# Patient Record
Sex: Male | Born: 1960
Health system: Southern US, Community
[De-identification: ages and names within clinical notes are randomized; demographics above are authoritative.]

## PROBLEM LIST (undated history)

## (undated) DIAGNOSIS — I2510 Atherosclerotic heart disease of native coronary artery without angina pectoris: Secondary | ICD-10-CM

## (undated) DIAGNOSIS — D509 Iron deficiency anemia, unspecified: Secondary | ICD-10-CM

## (undated) DIAGNOSIS — M549 Dorsalgia, unspecified: Secondary | ICD-10-CM

## (undated) DIAGNOSIS — N529 Male erectile dysfunction, unspecified: Secondary | ICD-10-CM

## (undated) DIAGNOSIS — B1920 Unspecified viral hepatitis C without hepatic coma: Secondary | ICD-10-CM

## (undated) DIAGNOSIS — I739 Peripheral vascular disease, unspecified: Secondary | ICD-10-CM

## (undated) DIAGNOSIS — I10 Essential (primary) hypertension: Secondary | ICD-10-CM

## (undated) DIAGNOSIS — K859 Acute pancreatitis without necrosis or infection, unspecified: Secondary | ICD-10-CM

## (undated) DIAGNOSIS — G709 Myoneural disorder, unspecified: Secondary | ICD-10-CM

## (undated) DIAGNOSIS — G8929 Other chronic pain: Secondary | ICD-10-CM

## (undated) HISTORY — DX: Acute pancreatitis without necrosis or infection, unspecified: K85.90

## (undated) HISTORY — DX: Unspecified viral hepatitis C without hepatic coma: B19.20

## (undated) HISTORY — DX: Essential (primary) hypertension: I10

## (undated) HISTORY — DX: Iron deficiency anemia, unspecified: D50.9

---

## 2012-01-09 ENCOUNTER — Emergency Department (HOSPITAL_COMMUNITY): Payer: Self-pay

## 2012-01-09 ENCOUNTER — Inpatient Hospital Stay (HOSPITAL_COMMUNITY)
Admission: EM | Admit: 2012-01-09 | Discharge: 2012-01-10 | DRG: 440 | Disposition: A | Discharge status: Home / Self Care | Payer: Self-pay | Attending: Infectious Diseases | Admitting: Infectious Diseases

## 2012-01-09 ENCOUNTER — Inpatient Hospital Stay (HOSPITAL_COMMUNITY): Payer: Self-pay

## 2012-01-09 ENCOUNTER — Other Ambulatory Visit: Payer: Self-pay

## 2012-01-09 ENCOUNTER — Encounter (HOSPITAL_COMMUNITY): Payer: Self-pay | Admitting: *Deleted

## 2012-01-09 DIAGNOSIS — K851 Biliary acute pancreatitis without necrosis or infection: Secondary | ICD-10-CM

## 2012-01-09 DIAGNOSIS — K859 Acute pancreatitis without necrosis or infection, unspecified: Principal | ICD-10-CM

## 2012-01-09 DIAGNOSIS — R109 Unspecified abdominal pain: Secondary | ICD-10-CM

## 2012-01-09 DIAGNOSIS — K8020 Calculus of gallbladder without cholecystitis without obstruction: Secondary | ICD-10-CM | POA: Diagnosis present

## 2012-01-09 DIAGNOSIS — F17200 Nicotine dependence, unspecified, uncomplicated: Secondary | ICD-10-CM | POA: Diagnosis present

## 2012-01-09 DIAGNOSIS — K2980 Duodenitis without bleeding: Secondary | ICD-10-CM | POA: Diagnosis present

## 2012-01-09 DIAGNOSIS — I10 Essential (primary) hypertension: Secondary | ICD-10-CM | POA: Diagnosis present

## 2012-01-09 DIAGNOSIS — K2970 Gastritis, unspecified, without bleeding: Secondary | ICD-10-CM | POA: Diagnosis present

## 2012-01-09 DIAGNOSIS — E119 Type 2 diabetes mellitus without complications: Secondary | ICD-10-CM | POA: Diagnosis present

## 2012-01-09 DIAGNOSIS — E1140 Type 2 diabetes mellitus with diabetic neuropathy, unspecified: Secondary | ICD-10-CM

## 2012-01-09 DIAGNOSIS — K2990 Gastroduodenitis, unspecified, without bleeding: Secondary | ICD-10-CM | POA: Diagnosis present

## 2012-01-09 HISTORY — DX: Myoneural disorder, unspecified: G70.9

## 2012-01-09 HISTORY — DX: Acute pancreatitis without necrosis or infection, unspecified: K85.90

## 2012-01-09 LAB — DIFFERENTIAL
Basophils Absolute: 0.1 K/uL (ref 0.0–0.1)
Basophils Relative: 1 % (ref 0–1)
Eosinophils Absolute: 0.1 K/uL (ref 0.0–0.7)
Eosinophils Relative: 1 % (ref 0–5)
Lymphocytes Relative: 49 % — ABNORMAL HIGH (ref 12–46)
Lymphs Abs: 3.6 10*3/uL (ref 0.7–4.0)
Monocytes Absolute: 0.7 10*3/uL (ref 0.1–1.0)
Monocytes Relative: 10 % (ref 3–12)
Neutro Abs: 2.8 10*3/uL (ref 1.7–7.7)
Neutrophils Relative %: 39 % — ABNORMAL LOW (ref 43–77)

## 2012-01-09 LAB — URINALYSIS, ROUTINE W REFLEX MICROSCOPIC
Bilirubin Urine: NEGATIVE
Glucose, UA: NEGATIVE mg/dL
Hgb urine dipstick: NEGATIVE
Ketones, ur: NEGATIVE mg/dL
Leukocytes, UA: NEGATIVE
Nitrite: NEGATIVE
Protein, ur: NEGATIVE mg/dL
Specific Gravity, Urine: 1.022 (ref 1.005–1.030)
Urobilinogen, UA: 1 mg/dL (ref 0.0–1.0)
pH: 6.5 (ref 5.0–8.0)

## 2012-01-09 LAB — CBC
HCT: 44.4 % (ref 39.0–52.0)
HCT: 45 % (ref 39.0–52.0)
Hemoglobin: 15 g/dL (ref 13.0–17.0)
Hemoglobin: 15 g/dL (ref 13.0–17.0)
MCH: 24.4 pg — ABNORMAL LOW (ref 26.0–34.0)
MCH: 24.3 pg — ABNORMAL LOW (ref 26.0–34.0)
MCHC: 33.8 g/dL (ref 30.0–36.0)
MCHC: 33.3 g/dL (ref 30.0–36.0)
MCV: 72.2 fL — ABNORMAL LOW (ref 78.0–100.0)
MCV: 72.8 fL — ABNORMAL LOW (ref 78.0–100.0)
Platelets: 203 10*3/uL (ref 150–400)
Platelets: 219 10*3/uL (ref 150–400)
RBC: 6.15 MIL/uL — ABNORMAL HIGH (ref 4.22–5.81)
RBC: 6.18 MIL/uL — ABNORMAL HIGH (ref 4.22–5.81)
RDW: 13.5 % (ref 11.5–15.5)
RDW: 13.7 % (ref 11.5–15.5)
WBC: 7.3 K/uL (ref 4.0–10.5)
WBC: 7.3 K/uL (ref 4.0–10.5)

## 2012-01-09 LAB — COMPREHENSIVE METABOLIC PANEL
BUN: 11 mg/dL (ref 6–23)
CO2: 27 mEq/L (ref 19–32)
Calcium: 9.5 mg/dL (ref 8.4–10.5)
Creatinine, Ser: 0.72 mg/dL (ref 0.50–1.35)
GFR calc Af Amer: >90 mL/min (ref >90)
GFR calc non Af Amer: >90 mL/min (ref >90)
Glucose, Bld: 148 mg/dL — ABNORMAL HIGH (ref 70–99)
Total Bilirubin: 0.4 mg/dL (ref 0.3–1.2)

## 2012-01-09 LAB — COMPREHENSIVE METABOLIC PANEL WITH GFR
ALT: 23 U/L (ref 0–53)
AST: 23 U/L (ref 0–37)
Albumin: 3.4 g/dL — ABNORMAL LOW (ref 3.5–5.2)
Alkaline Phosphatase: 101 U/L (ref 39–117)
Chloride: 102 meq/L (ref 96–112)
Potassium: 4.4 meq/L (ref 3.5–5.1)
Sodium: 138 meq/L (ref 135–145)
Total Protein: 7.4 g/dL (ref 6.0–8.3)

## 2012-01-09 LAB — CREATININE, SERUM
Creatinine, Ser: 0.69 mg/dL (ref 0.50–1.35)
GFR calc Af Amer: >90 mL/min (ref >90)
GFR calc non Af Amer: >90 mL/min (ref >90)

## 2012-01-09 LAB — LIPID PANEL
Cholesterol: 171 mg/dL (ref 0–200)
HDL: 41 mg/dL (ref >39)
HDL: 40 mg/dL (ref >39)
LDL Cholesterol: 105 mg/dL — ABNORMAL HIGH (ref 0–99)
LDL Cholesterol: 104 mg/dL — ABNORMAL HIGH (ref 0–99)
Total CHOL/HDL Ratio: 4.3 RATIO
Triglycerides: 96 mg/dL (ref <150)
Triglycerides: 134 mg/dL (ref <150)
VLDL: 27 mg/dL (ref 0–40)

## 2012-01-09 LAB — RAPID URINE DRUG SCREEN, HOSP PERFORMED
Amphetamines: NOT DETECTED
Barbiturates: POSITIVE — AB
Benzodiazepines: NOT DETECTED
Cocaine: NOT DETECTED
Opiates: NOT DETECTED
Tetrahydrocannabinol: POSITIVE — AB

## 2012-01-09 LAB — HEMOGLOBIN A1C
Hgb A1c MFr Bld: 8.1 % — ABNORMAL HIGH (ref <5.7)
Mean Plasma Glucose: 186 mg/dL — ABNORMAL HIGH (ref <117)

## 2012-01-09 LAB — GLUCOSE, CAPILLARY: Glucose-Capillary: 112 mg/dL — ABNORMAL HIGH (ref 70–99)

## 2012-01-09 LAB — LIPASE, BLOOD: Lipase: 71 U/L — ABNORMAL HIGH (ref 11–59)

## 2012-01-09 LAB — TSH: TSH: 1.093 u[IU]/mL (ref 0.350–4.500)

## 2012-01-09 LAB — POCT I-STAT TROPONIN I: Troponin i, poc: 0 ng/mL (ref 0.00–0.08)

## 2012-01-09 MED ORDER — HEPARIN SODIUM (PORCINE) 5000 UNIT/ML IJ SOLN
5000.0000 [IU] | Freq: Three times a day (TID) | INTRAMUSCULAR | Status: DC
  Administered 2012-01-09 (×2): 5000 [IU] via SUBCUTANEOUS
  Filled 2012-01-09 (×6): qty 1

## 2012-01-09 MED ORDER — SODIUM CHLORIDE 0.9 % IV SOLN
Freq: Once | INTRAVENOUS | Status: AC
  Administered 2012-01-09: 09:00:00 via INTRAVENOUS

## 2012-01-09 MED ORDER — ONDANSETRON 4 MG PO TBDP
8.0000 mg | ORAL_TABLET | Freq: Once | ORAL | Status: AC
  Administered 2012-01-09: 8 mg via ORAL
  Filled 2012-01-09: qty 2

## 2012-01-09 MED ORDER — IOHEXOL 300 MG/ML  SOLN
100.0000 mL | Freq: Once | INTRAMUSCULAR | Status: AC | PRN
  Administered 2012-01-09: 100 mL via INTRAVENOUS

## 2012-01-09 MED ORDER — GI COCKTAIL ~~LOC~~
30.0000 mL | Freq: Once | ORAL | Status: AC
  Administered 2012-01-09: 30 mL via ORAL
  Filled 2012-01-09: qty 30

## 2012-01-09 MED ORDER — HYDROMORPHONE HCL PF 1 MG/ML IJ SOLN
1.0000 mg | Freq: Once | INTRAMUSCULAR | Status: AC
  Administered 2012-01-09: 1 mg via INTRAVENOUS
  Filled 2012-01-09: qty 1

## 2012-01-09 MED ORDER — BISMUTH SUBSALICYLATE 262 MG/15ML PO SUSP
15.0000 mL | Freq: Four times a day (QID) | ORAL | Status: DC | PRN
  Filled 2012-01-09: qty 236

## 2012-01-09 MED ORDER — INSULIN ASPART 100 UNIT/ML ~~LOC~~ SOLN
0.0000 [IU] | Freq: Three times a day (TID) | SUBCUTANEOUS | Status: DC
  Filled 2012-01-09: qty 3

## 2012-01-09 MED ORDER — SODIUM CHLORIDE 0.9 % IV SOLN
INTRAVENOUS | Status: DC
  Administered 2012-01-09: 16:00:00 via INTRAVENOUS

## 2012-01-09 MED ORDER — PANTOPRAZOLE SODIUM 40 MG PO TBEC
40.0000 mg | DELAYED_RELEASE_TABLET | Freq: Two times a day (BID) | ORAL | Status: DC
  Administered 2012-01-09 – 2012-01-10 (×3): 40 mg via ORAL
  Filled 2012-01-09: qty 1

## 2012-01-09 MED ORDER — MORPHINE SULFATE 2 MG/ML IJ SOLN
2.0000 mg | INTRAMUSCULAR | Status: DC | PRN
  Administered 2012-01-09 (×2): 2 mg via INTRAVENOUS
  Filled 2012-01-09 (×2): qty 1

## 2012-01-09 MED ORDER — IOHEXOL 300 MG/ML  SOLN: 20.0000 mL | INTRAMUSCULAR | Status: AC

## 2012-01-09 MED ORDER — INFLUENZA VIRUS VACC SPLIT PF IM SUSP
0.5000 mL | INTRAMUSCULAR | Status: DC
  Filled 2012-01-09: qty 0.5

## 2012-01-09 MED ORDER — PANTOPRAZOLE SODIUM 40 MG PO TBEC
DELAYED_RELEASE_TABLET | ORAL | Status: AC
  Administered 2012-01-09: 40 mg via ORAL
  Filled 2012-01-09: qty 1

## 2012-01-09 MED ORDER — HYDROMORPHONE HCL PF 1 MG/ML IJ SOLN
0.5000 mg | Freq: Once | INTRAMUSCULAR | Status: AC
  Administered 2012-01-09: 0.5 mg via INTRAVENOUS

## 2012-01-09 MED ORDER — IOHEXOL 300 MG/ML  SOLN
40.0000 mL | Freq: Once | INTRAMUSCULAR | Status: AC | PRN
  Administered 2012-01-09: 40 mL via ORAL

## 2012-01-09 MED ORDER — ACETAMINOPHEN 650 MG RE SUPP: 650.0000 mg | Freq: Four times a day (QID) | RECTAL | Status: DC | PRN

## 2012-01-09 MED ORDER — ACETAMINOPHEN 325 MG PO TABS: 650.0000 mg | ORAL_TABLET | Freq: Four times a day (QID) | ORAL | Status: DC | PRN

## 2012-01-09 MED ORDER — HYDROMORPHONE HCL PF 1 MG/ML IJ SOLN
1.0000 mg | INTRAMUSCULAR | Status: DC | PRN
  Filled 2012-01-09: qty 1

## 2012-01-09 MED ORDER — PNEUMOCOCCAL VAC POLYVALENT 25 MCG/0.5ML IJ INJ
0.5000 mL | INJECTION | INTRAMUSCULAR | Status: DC
  Filled 2012-01-09: qty 0.5

## 2012-01-09 MED ORDER — PANTOPRAZOLE SODIUM 40 MG PO TBEC
40.0000 mg | DELAYED_RELEASE_TABLET | Freq: Every day | ORAL | Status: DC
  Filled 2012-01-09: qty 1

## 2012-01-09 NOTE — ED Notes (Signed)
Pt resting comfortably now that he has been medicated again. Pt advised of plan of care and who has been called in for consults. Pt offered pillow but states that at this time he is comfortable. Pt offered toilet but states that he doesn't need it at this time. Iv site clear for infiltrate. Waiting on admitting md.

## 2012-01-09 NOTE — Consult Note (Signed)
Reason for Consult:Abdominal pain, cholelithiasis, pancreatitis Referring Physician: Ghim NO Primary care Wed 01/09/12   Donald Palmer is an 51 y.o. male.  HPI: Pt presents with abdominal pain.  He says it started after drinking at [employer]. PM 01/04/11, He ate and [employer] then got sick with N/V.  Doesn't remember much after that till he got up Sunday morning still had pain.  Pain has been ongoing since Sunday.  +n/v on Sunday and Monday, none since Tuesday.  He has had problems with this in the past for at least 5 years.  Usually comes on after, "drinking and worrying."  He takes Zantac for this.  He has a hx of AODM, but is not really compliant with his meds.  Medicine bottles are all from [place], and the latest is his Zantac which is dated 11/2011, His Metformin is dated 02/2011, and Actos, 04/2011.  He admits to taking meds irreuglarly because he does not have a job or way to pay for them. He says his pain has become worse, and he also reports a headache with his abd. Pain.  W/U shows normal WBC,Normal LFT's, and Ultrasound with single mobile Gallstone, neg. Murphy's GB wall 3mm. We are ask to see for cholelithiasis, and possible cholecystectomy.  Past Medical History  Diagnosis Date  . Diabetes mellitus   Tobacco use Etoh Use Gerd Abdominal pain on and off 5 years  History reviewed. No pertinent past surgical history.  History reviewed. No pertinent family history.  Social History:  reports that he has quit smoking. He does not have any smokeless tobacco history on file. He reports that he drinks alcohol. His drug history not on file. ETOH :4-5 beers on weekends  Tob: 1/2 PPD since age 89  Drugs: MJ on Friday. Unemployed, family helps him pay his bills.  11th grade education Allergies: No Known Allergies  Medications:  Prior to Admission:  (Not in a hospital admission) Scheduled:   . sodium chloride   Intravenous Once  . gi cocktail  30 mL Oral Once  .  HYDROmorphone (DILAUDID)  injection  0.5 mg Intravenous Once  .  HYDROmorphone (DILAUDID) injection  1 mg Intravenous Once  .  HYDROmorphone (DILAUDID) injection  1 mg Intravenous Once  . ondansetron  8 mg Oral Once   Continuous:  WUJ:[id number] (DILAUDID) injection  Results for orders placed during the hospital encounter of 01/09/12 (from the past 48 hour(s))  CBC     Status: Abnormal   Collection Time   01/09/12  8:50 AM      Component Value Range Comment   WBC 7.3  4.0 - 10.5 (K/uL)    RBC 6.15 (*) 4.22 - 5.81 (MIL/uL)    Hemoglobin 15.0  13.0 - 17.0 (g/dL)    HCT 13.0  86.5 - 78.4 (%)    MCV 72.2 (*) 78.0 - 100.0 (fL)    MCH 24.4 (*) 26.0 - 34.0 (pg)    MCHC 33.8  30.0 - 36.0 (g/dL)    RDW 69.6  29.5 - 28.4 (%)    Platelets 203  150 - 400 (K/uL)   DIFFERENTIAL     Status: Abnormal   Collection Time   01/09/12  8:50 AM      Component Value Range Comment   Neutrophils Relative 39 (*) 43 - 77 (%)    Lymphocytes Relative 49 (*) 12 - 46 (%)    Monocytes Relative 10  3 - 12 (%)    Eosinophils Relative 1  0 -  5 (%)    Basophils Relative 1  0 - 1 (%)    Neutro Abs 2.8  1.7 - 7.7 (K/uL)    Lymphs Abs 3.6  0.7 - 4.0 (K/uL)    Monocytes Absolute 0.7  0.1 - 1.0 (K/uL)    Eosinophils Absolute 0.1  0.0 - 0.7 (K/uL)    Basophils Absolute 0.1  0.0 - 0.1 (K/uL)    Smear Review MORPHOLOGY UNREMARKABLE     COMPREHENSIVE METABOLIC PANEL     Status: Abnormal   Collection Time   01/09/12  8:50 AM      Component Value Range Comment   Sodium 138  135 - 145 (mEq/L)    Potassium 4.4  3.5 - 5.1 (mEq/L)    Chloride 102  96 - 112 (mEq/L)    CO2 27  19 - 32 (mEq/L)    Glucose, Bld 148 (*) 70 - 99 (mg/dL)    BUN 11  6 - 23 (mg/dL)    Creatinine, Ser 1.61  0.50 - 1.35 (mg/dL)    Calcium 9.5  8.4 - 10.5 (mg/dL)    Total Protein 7.4  6.0 - 8.3 (g/dL)    Albumin 3.4 (*) 3.5 - 5.2 (g/dL)    AST 23  0 - 37 (U/L)    ALT 23  0 - 53 (U/L)    Alkaline Phosphatase 101  39 - 117 (U/L)    Total Bilirubin 0.4  0.3 - 1.2 (mg/dL)     GFR calc non Af Amer >90  >90 (mL/min)    GFR calc Af Amer >90  >90 (mL/min)   LIPASE, BLOOD     Status: Abnormal   Collection Time   01/09/12  8:50 AM      Component Value Range Comment   Lipase 71 (*) 11 - 59 (U/L)   POCT I-STAT TROPONIN I     Status: Normal   Collection Time   01/09/12  8:51 AM      Component Value Range Comment   Troponin i, poc 0.00  0.00 - 0.08 (ng/mL)    Comment 3            URINALYSIS, ROUTINE W REFLEX MICROSCOPIC     Status: Normal   Collection Time   01/09/12  9:56 AM      Component Value Range Comment   Color, Urine YELLOW  YELLOW     APPearance CLEAR  CLEAR     Specific Gravity, Urine 1.022  1.005 - 1.030     pH 6.5  5.0 - 8.0     Glucose, UA NEGATIVE  NEGATIVE (mg/dL)    Hgb urine dipstick NEGATIVE  NEGATIVE     Bilirubin Urine NEGATIVE  NEGATIVE     Ketones, ur NEGATIVE  NEGATIVE (mg/dL)    Protein, ur NEGATIVE  NEGATIVE (mg/dL)    Urobilinogen, UA 1.0  0.0 - 1.0 (mg/dL)    Nitrite NEGATIVE  NEGATIVE     Leukocytes, UA NEGATIVE  NEGATIVE  MICROSCOPIC NOT DONE ON URINES WITH NEGATIVE PROTEIN, BLOOD, LEUKOCYTES, NITRITE, OR GLUCOSE <1000 mg/dL.    US Abdomen Complete  01/09/2012  *RADIOLOGY REPORT*  Clinical Data:  Pancreatitis, abdominal pain.  COMPLETE ABDOMINAL ULTRASOUND  Comparison:  None.  Findings:  Gallbladder:  Single small echogenic focus with shadowing within the gallbladder compatible with small stone.  Gallbladder wall is borderline thickened at 3 mm.  Negative sonographic Murphy's.  Common bile duct:   Within normal limits in caliber.  Liver:  No  focal lesion identified.  Within normal limits in parenchymal echogenicity.  IVC:  Appears normal.  Pancreas:  Not well visualized due to overlying bowel gas.  Spleen:  Within normal limits in size and echotexture.  Right Kidney:   Normal in size and parenchymal echogenicity.  No evidence of mass or hydronephrosis.  Left Kidney:  Normal in size and parenchymal echogenicity.  No evidence of mass or  hydronephrosis.  Abdominal aorta:  No aneurysm identified.  IMPRESSION: Single small mobile gallstone.  Borderline gallbladder wall thickness without sonographic Murphy's sign.  Original Report Authenticated By: Cyndie Chime, M.D.    Review of Systems  Constitutional: Negative for fever, chills, weight loss, malaise/fatigue and diaphoresis.  Eyes: Negative.   Respiratory: Negative.   Cardiovascular: Negative.   Gastrointestinal: Positive for heartburn, nausea (none since Tuesday), vomiting (None since Tues) and abdominal pain (Vague, he describes it to me as Left side at end of exam, and mid abd when I first started talking to himl). Negative for diarrhea, constipation and blood in stool.  Genitourinary: Negative.   Musculoskeletal: Negative.   Neurological: Positive for tingling, sensory change and headaches. Negative for dizziness, tremors, speech change, focal weakness, seizures, loss of consciousness and weakness.       Complains of numbness and tinging in lower extremities. Sites vary with time.  Endo/Heme/Allergies: Negative.    Blood pressure 131/93, pulse 68, temperature 98.1 F (36.7 C), temperature source Oral, resp. rate 18, SpO2 98.00%. Physical Exam  Constitutional: He is oriented to person, place, and time. He appears well-developed and well-nourished. No distress.  HENT:  Head: Normocephalic and atraumatic.  Nose: Nose normal.  Eyes: Conjunctivae and EOM are normal. Pupils are equal, round, and reactive to light. Right eye exhibits no discharge. Left eye exhibits no discharge. No scleral icterus.  Neck: Normal range of motion. Neck supple. No JVD present. No tracheal deviation present. No thyromegaly present.  Cardiovascular: Normal rate, regular rhythm, normal heart sounds and intact distal pulses.  Exam reveals no gallop and no friction rub.   No murmur heard. Respiratory: Effort normal and breath sounds normal. No respiratory distress. He has no wheezes. He has no rales.  He exhibits no tenderness.  GI: Soft. Bowel sounds are normal. He exhibits no distension and no mass. There is tenderness (Currently tender LUQ and LLQ, no pain on right side.). There is no rebound and no guarding.  Lymphadenopathy:    He has no cervical adenopathy.  Neurological: He is alert and oriented to person, place, and time. No cranial nerve deficit. He exhibits normal muscle tone.  Skin: Skin is warm and dry.  Psychiatric: He has a normal mood and affect. His behavior is normal. Judgment and thought content normal.    Assessment/Plan: 1. Abdominal pain with mild rise in lipase, mild pancreatitis, Cholelithiasis. 2. Gerd/ possible gastritis 3. Diabetes with poor Medical compliance 4.Ongoing ETOH, Tobacco, and MJ use. 5. Possible diabetic neuropathy. 6. On going headache.  Plan:  I would start PPI, control pain, recheck labs.  I do not think his gallstone is the cause of his pain.  He is currently without any pain on Right.  Thursa Emme 01/09/2012, 2:33 PM

## 2012-01-09 NOTE — ED Notes (Signed)
Urine sample requested from patient.  Patient states he is unable to void at this time.  Urinal left at bedside.

## 2012-01-09 NOTE — ED Notes (Signed)
Report to receiving RN, pt aware of poc, reports relief with pain medication

## 2012-01-09 NOTE — ED Notes (Signed)
[id number] Ready

## 2012-01-09 NOTE — ED Provider Notes (Signed)
History     CSN: [id number]  Arrival date & time 01/09/12  0707   First MD Initiated Contact with Patient 01/09/12 [phone]      Chief Complaint  Patient presents with  . Abdominal Pain    (Consider location/radiation/quality/duration/timing/severity/associated sxs/prior treatment) HPI Comments: Patient reports prior history of abdominal problems. He reports that he was recently in [place] staying with a woman that he is no longer with. He was seen there at a local hospital and was given what sounds to be a GI cocktail which provided relief. He also has a significant history of type 2 diabetes. Patient reports that he did have about 5 years last Saturday, and then began having his abdominal symptoms again. He has continued to have some upper epigastric discomfort that is nonradiating. It associated with her change in appetite. He reports that he has been trying to drink ginger ale and will eat a small amount of them may have some vomiting. He denies any constipation, diarrhea. He denies melanotic stools or rectal bleeding. He denies any back pain, chest pain, shortness of breath. No fevers or chills. Denies a progress report type infectious symptoms.  He denies any prior abd surgeries in the past.  Patient is a 51 y.o. male presenting with abdominal pain. The history is provided by the patient.  Abdominal Pain The primary symptoms of the illness include abdominal pain, nausea and vomiting.    Past Medical History  Diagnosis Date  . Diabetes mellitus   . Neuromuscular disorder     History reviewed. No pertinent past surgical history.  History reviewed. No pertinent family history.  History  Substance Use Topics  . Smoking status: Former Smoker -- 0.2 packs/day    Quit date: 01/05/2012  . Smokeless tobacco: Never Used  . Alcohol Use: 3.0 oz/week    5 Cans of beer per week      Review of Systems  Gastrointestinal: Positive for nausea, vomiting and abdominal pain.  All other  systems reviewed and are negative.    Allergies  Review of patient's allergies indicates no known allergies.  Home Medications   No current outpatient prescriptions on file.  BP 158/95  Pulse 78  Temp(Src) 98 F (36.7 C) (Oral)  Resp 16  Ht 5\' 9"  (1.753 m)  Wt 181 lb (82.101 kg)  BMI 26.73 kg/m2  SpO2 98%  Physical Exam  Nursing note and vitals reviewed. Constitutional: He is oriented to person, place, and time. He appears well-developed and well-nourished.  HENT:  Head: Normocephalic and atraumatic.  Eyes: Pupils are equal, round, and reactive to light.  Neck: Normal range of motion. Neck supple.  Cardiovascular: Normal rate, regular rhythm and normal heart sounds.   Pulmonary/Chest: Effort normal and breath sounds normal. No respiratory distress. He has no wheezes.  Abdominal: Soft. He exhibits no distension. There is no tenderness. There is no rebound and no guarding.  Neurological: He is alert and oriented to person, place, and time.  Skin: Skin is warm and dry. No rash noted.  Psychiatric: He has a normal mood and affect.    ED Course  Procedures (including critical care time)  Labs Reviewed  CBC - Abnormal; Notable for the following:    RBC 6.15 (*)    MCV 72.2 (*)    MCH 24.4 (*)    All other components within normal limits  DIFFERENTIAL - Abnormal; Notable for the following:    Neutrophils Relative 39 (*)    Lymphocytes Relative 49 (*)  All other components within normal limits  COMPREHENSIVE METABOLIC PANEL - Abnormal; Notable for the following:    Glucose, Bld 148 (*)    Albumin 3.4 (*)    All other components within normal limits  LIPASE, BLOOD - Abnormal; Notable for the following:    Lipase 71 (*)    All other components within normal limits  LIPID PANEL - Abnormal; Notable for the following:    LDL Cholesterol 105 (*)    All other components within normal limits  HEMOGLOBIN A1C - Abnormal; Notable for the following:    Hemoglobin A1C 8.1 (*)     Mean Plasma Glucose 186 (*)    All other components within normal limits  CBC - Abnormal; Notable for the following:    RBC 6.18 (*)    MCV 72.8 (*)    MCH 24.3 (*)    All other components within normal limits  LIPID PANEL - Abnormal; Notable for the following:    LDL Cholesterol 104 (*)    All other components within normal limits  URINE RAPID DRUG SCREEN (HOSP PERFORMED) - Abnormal; Notable for the following:    Tetrahydrocannabinol POSITIVE (*)    Barbiturates POSITIVE (*)    All other components within normal limits  CBC - Abnormal; Notable for the following:    RBC 6.36 (*)    MCV 73.1 (*)    MCH 23.6 (*)    All other components within normal limits  COMPREHENSIVE METABOLIC PANEL - Abnormal; Notable for the following:    Glucose, Bld 108 (*)    Albumin 3.4 (*)    All other components within normal limits  GLUCOSE, CAPILLARY - Abnormal; Notable for the following:    Glucose-Capillary 111 (*)    All other components within normal limits  GLUCOSE, CAPILLARY - Abnormal; Notable for the following:    Glucose-Capillary 112 (*)    All other components within normal limits  URINALYSIS, ROUTINE W REFLEX MICROSCOPIC  POCT I-STAT TROPONIN I  CREATININE, SERUM  TSH  HIV ANTIBODY (ROUTINE TESTING)  MICROALBUMIN / CREATININE URINE RATIO  LIPASE, BLOOD  I-STAT TROPONIN I  POCT CBG MONITORING  HEMOGLOBIN A1C  POCT CBG MONITORING   US Abdomen Complete  01/09/2012  *RADIOLOGY REPORT*  Clinical Data:  Pancreatitis, abdominal pain.  COMPLETE ABDOMINAL ULTRASOUND  Comparison:  None.  Findings:  Gallbladder:  Single small echogenic focus with shadowing within the gallbladder compatible with small stone.  Gallbladder wall is borderline thickened at 3 mm.  Negative sonographic Murphy's.  Common bile duct:   Within normal limits in caliber.  Liver:  No focal lesion identified.  Within normal limits in parenchymal echogenicity.  IVC:  Appears normal.  Pancreas:  Not well visualized due to  overlying bowel gas.  Spleen:  Within normal limits in size and echotexture.  Right Kidney:   Normal in size and parenchymal echogenicity.  No evidence of mass or hydronephrosis.  Left Kidney:  Normal in size and parenchymal echogenicity.  No evidence of mass or hydronephrosis.  Abdominal aorta:  No aneurysm identified.  IMPRESSION: Single small mobile gallstone.  Borderline gallbladder wall thickness without sonographic Murphy's sign.  Original Report Authenticated By: Cyndie Chime, M.D.   Ct Abdomen Pelvis W Contrast  01/09/2012  *RADIOLOGY REPORT*  Clinical Data: Epigastric pain.  Pancreatitis.  The nausea and vomiting.  Fever.  CT ABDOMEN AND PELVIS WITH CONTRAST  Technique:  Multidetector CT imaging of the abdomen and pelvis was performed following the standard protocol during  bolus administration of intravenous contrast.  Contrast: 40mL OMNIPAQUE IOHEXOL 300 MG/ML IV SOLN, 1 OMNIPAQUE IOHEXOL 300 MG/ML IV SOLN, OMNIPAQUE IOHEXOL 300 MG/ML IV SOLN  Comparison: Abdominal ultrasound of 01/09/2012.  Findings:  The lung bases are clear without focal nodule, mass, or airspace disease.  The heart size is normal.  No significant pleural or pericardial effusion is present.  The liver and spleen are within normal limits.  There is diffuse wall thickening throughout the stomach, particularly in the antrum extending to the duodenal bulb.  The more distal duodenum is unremarkable.  The pancreas is within normal limits.  The common bile duct and gallbladder are unremarkable.  The adrenal glands are normal bilaterally.  The kidneys and ureters are unremarkable.  The rectosigmoid colon is within normal limits.  The remainder of the colon is unremarkable.  The appendix is visualized and within normal limits.  The small bowel is unremarkable.  No significant adenopathy or free fluid is present.  The bone windows are unremarkable.  IMPRESSION:  1.  Edematous inflammatory changes the distal stomach or proximal duodenum  compatible with gastroduodenitis. 2.  No focal abnormality of the pancreas. 3.  The gallbladder is unremarkable. 4.  Mild atherosclerotic changes.  Original Report Authenticated By: Jamesetta Orleans. MATTERN, M.D.     1. Pancreatitis, gallstone     Room air saturation is 95% which is normal.  ECG done at time 09:02, shows a sinus rhythm at rate 57. There is borderline PR prolongation at 204 ms. Borderline poor R-wave progression from leads V1 through V3. Otherwise axis is normal and there are no ST or T wave changes noted. There no prior EKGs for comparison the  MDM  Patient's vital signs are otherwise stable. He is afebrile. His abdominal exam shows no gross tenderness. Based on his description, he may have an early ulcer or acid reflux. The other possibility given his history would be some mild alcoholic gastritis. Plan is to give him Zofran and a GI cocktail. If he has complete resolution of his symptoms, I feel the patient is stable to be discharged. If he does not, will obtain blood tests and possibly imaging for his abdominal discomfort.      8:37 AM Pt reports gi cocktail may have helped just briefly, but pain has since gotten worse.  Will check labs, give IVF's and IV analgesics.  Pancreatitis is on differential.  9:45 AM LFT's are ok, WBC is not elevated.  Lipase is slightly up at 71.  Likely pt has mild pancreatitis.  Will get U/S to assess for cholecystitis, which I doubt and to r/o pancreatic mass.  Given he had beers on Saturday prior to symptom onset, likely alcohol is cause of pancreatitis.    12:22 PM Patient just informed RN that he still has 10/10 abdominal pain. At this point do to the ultrasound suggestive of gallstones with borderline wall thickening and his persistent pain, will consult Gen. surgery to evaluate the patient. However his WBC count and LFTs are not elevated at this time.  This is reassuring considering pain has been present for 4 days.  May require  admission      I discussed with general surgery who believes pt is ok for medical admit, they will consult.  I spoke to [facility] resident team to evaluate and admit for observation and treatment of symptoms.  Pt is moved to [facility] to await admission.   Gavin Pound. Oletta Lamas, MD 01/10/12 [phone]

## 2012-01-09 NOTE — ED Notes (Signed)
2ND REQUEST FOR URINE.

## 2012-01-09 NOTE — H&P (Signed)
Hospital Admission Note Date: 01/09/2012  Patient name: Donald Palmer Medical record number: [id number] Date of birth: 1961/04/14 Age: 51 y.o. Gender: male PCP: DEFAULT,PROVIDER, MD, MD  Medical Service: Maurice March  Attending physician: Dr. Maurice March    1st Contact:    Dr. Maisie Fus   Pager: [phone] 2nd Contact:  Dr. Allena Katz              Pager: [phone]  After 5 pm or weekends: 1st Contact:      Pager: [phone] 2nd Contact:      Pager: [phone]   Chief Complaint: Abdominal Pain  History of Present Illness: Patient is a 51 y.o. man with past medical history of DM2 and what sounds like GERD, however, patient does not recall precise diagnosis.  He presents to the ED with abdominal pain which started Saturday night.  He had been out a club with friends where he had about 4 beers and then stopped for [employer] fish sandwich.  After eating he was sick and vomited.  Since he has had decreased appetite though he reports that he has been trying to drink ginger ale and will eat a small amount.  He continued to have episodes of vomiting Sunday and Monday, but it seems to have subsided, however, the pain has become unbearable.  He denies any constipation, diarrhea. He denies melanotic stools or rectal bleeding. He denies any back pain, chest pain, shortness of breath. He does describe the pain as being across his upper abdomen and sharply radiating upwards to the top of his head.  Unclear if he has visual changes with the pain, but does describe "numbness" of his face. No fevers or chills. He denies any prior abdominal surgeries in the past.  Meds: Medications Prior to Admission  Medication Dose Route Frequency Provider Last Rate Last Dose  . 0.9 %  sodium chloride infusion   Intravenous Once Gavin Pound. Ghim, MD 150 mL/hr at 01/09/12 0901    . gi cocktail  30 mL Oral Once Gavin Pound. Ghim, MD   30 mL at 01/09/12 0744  . HYDROmorphone (DILAUDID) injection 0.5 mg  0.5 mg Intravenous Once Gavin Pound. Ghim, MD   0.5 mg at  01/09/12 1330  . HYDROmorphone (DILAUDID) injection 1 mg  1 mg Intravenous Once Gavin Pound. Ghim, MD   1 mg at 01/09/12 0901  . HYDROmorphone (DILAUDID) injection 1 mg  1 mg Intravenous Once Gavin Pound. Ghim, MD   1 mg at 01/09/12 1228  . HYDROmorphone (DILAUDID) injection 1 mg  1 mg Intravenous Q3H PRN Gavin Pound. Ghim, MD      . ondansetron (ZOFRAN-ODT) disintegrating tablet 8 mg  8 mg Oral Once Gavin Pound. Ghim, MD   8 mg at 01/09/12 0744   No current outpatient prescriptions on file as of 01/09/2012.    Allergies: Review of patient's allergies indicates no known allergies. Past Medical History  Diagnosis Date  . Diabetes mellitus    History reviewed. No pertinent past surgical history. History reviewed. No pertinent family history. History   Social History  . Marital Status: Single    Spouse Name: N/A    Number of Children: N/A  . Years of Education: N/A   Occupational History  . Not on file.   Social History Main Topics  . Smoking status: Former Games developer  . Smokeless tobacco: Not on file  . Alcohol Use: Yes  . Drug Use:   . Sexually Active:    Other Topics Concern  . Not on file  Social History Narrative  . No narrative on file    Review of Systems: Pertinent positives reported in HPI, all others in 10 pt ROS negative.  Physical Exam: Blood pressure 131/93, pulse 68, temperature 98.1 F (36.7 C), temperature source Oral, resp. rate 18, SpO2 98.00%. Nursing note and vitals reviewed.  Constitutional: He is oriented to person, place, and time. He appears well-developed and well-nourished.  Head: Normocephalic and atraumatic.  Cardiovascular: Normal rate, regular rhythm and normal heart sounds.  Pulmonary/Chest: Effort normal and breath sounds normal. No respiratory distress. He has no wheezes.  Abdominal: Soft. He exhibits no distension. Tenderness of upper abdomen. There is no rebound and no guarding.  Neurological: He is alert and oriented to person, place, and  time.  Skin: Skin is warm and dry. No rash noted.  Psychiatric: He has a normal mood and affect.   Lab results: Basic Metabolic Panel:  [facility] 01/09/12 0850  NA 138  K 4.4  CL 102  CO2 27  GLUCOSE 148*  BUN 11  CREATININE 0.72  CALCIUM 9.5  MG --  PHOS --   Liver Function Tests:  [facility] 01/09/12 0850  AST 23  ALT 23  ALKPHOS 101  BILITOT 0.4  PROT 7.4  ALBUMIN 3.4*    Basename 01/09/12 0850  LIPASE 71*  AMYLASE --   CBC:  Basename 01/09/12 0850  WBC 7.3  NEUTROABS 2.8  HGB 15.0  HCT 44.4  MCV 72.2*  PLT 203    Imaging results:  US Abdomen Complete  01/09/2012  *RADIOLOGY REPORT*  Clinical Data:  Pancreatitis, abdominal pain.  COMPLETE ABDOMINAL ULTRASOUND  Comparison:  None.  Findings:  Gallbladder:  Single small echogenic focus with shadowing within the gallbladder compatible with small stone.  Gallbladder wall is borderline thickened at 3 mm.  Negative sonographic Murphy's.  Common bile duct:   Within normal limits in caliber.  Liver:  No focal lesion identified.  Within normal limits in parenchymal echogenicity.  IVC:  Appears normal.  Pancreas:  Not well visualized due to overlying bowel gas.  Spleen:  Within normal limits in size and echotexture.  Right Kidney:   Normal in size and parenchymal echogenicity.  No evidence of mass or hydronephrosis.  Left Kidney:  Normal in size and parenchymal echogenicity.  No evidence of mass or hydronephrosis.  Abdominal aorta:  No aneurysm identified.  IMPRESSION: Single small mobile gallstone.  Borderline gallbladder wall thickness without sonographic Murphy's sign.  Original Report Authenticated By: Cyndie Chime, M.D.    Other results: EKG: normal sinus rhythm, normal axis, PR prolongation suggestive of first degree block.  No ST changes suggestive ACS, some repolarization abnormality in anterior leads. No prior EKGs for comparison.  No PMH of heart disease.  Assessment & Plan by Problem:  Pancreatitis Acute  Pancreatitis: Pt has had abdominal pain, nausea and vomiting ongoing for 4 days.  US shows single gallstone with negative murphy's sign.  Labs show slightly elevated lipase which is consistent with diagnosis of pancreatitis, also concern for cholelithiasis, cholecystitis, gastris, peptic ulcer disease though less likely the cause of his current complaint.  The patient is currently hemodynamically stable.   Plan -We will admit the patient to regular floor  -NPO until surgery evaluates  -IV fluids for hydration.  -zofran prn nausea -morphine prn for pain  -recheck CBC, CMP, lipase, and lipid panel in the AM -surgery consulted for question need for cholecystectomy  Diabetes mellitus, type 2- unknown  -hold home medications  -SSI  for glucose control -A1c in AM -consult diabetes educator -offer flu vaccine and pneumovax -carb modified diet once no longer NPO -will attempt to get records from hospital and PCP in [place], [place].  Dispo: home when tolerating oral intake and when pain in under control. Recommend follow up with PCP to establish care back in [place].  Signed: Lamont Tant 01/09/2012, 1:50 PM

## 2012-01-09 NOTE — Consult Note (Signed)
Patient seen and examined.  His clinical and laboratory findings are suspicious for alcohol related pancreatitis.  This is a recurring pattern after binge drinking.  Will order a CT scan to assess the pancreas.

## 2012-01-09 NOTE — ED Notes (Signed)
Pt reports 4 day hx of abdominal pain, reports hx of abdominal pain but can not give me any specifics.

## 2012-01-10 LAB — COMPREHENSIVE METABOLIC PANEL
AST: 22 U/L (ref 0–37)
Albumin: 3.4 g/dL — ABNORMAL LOW (ref 3.5–5.2)
Alkaline Phosphatase: 98 U/L (ref 39–117)
BUN: 8 mg/dL (ref 6–23)
CO2: 27 mEq/L (ref 19–32)
Chloride: 101 mEq/L (ref 96–112)
Potassium: 4 mEq/L (ref 3.5–5.1)
Total Bilirubin: 0.8 mg/dL (ref 0.3–1.2)

## 2012-01-10 LAB — CBC
HCT: 46.5 % (ref 39.0–52.0)
Hemoglobin: 15 g/dL (ref 13.0–17.0)
MCH: 23.6 pg — ABNORMAL LOW (ref 26.0–34.0)
MCHC: 32.3 g/dL (ref 30.0–36.0)
MCV: 73.1 fL — ABNORMAL LOW (ref 78.0–100.0)
Platelets: 218 10*3/uL (ref 150–400)
RBC: 6.36 MIL/uL — ABNORMAL HIGH (ref 4.22–5.81)
RDW: 13.8 % (ref 11.5–15.5)
WBC: 6.6 K/uL (ref 4.0–10.5)

## 2012-01-10 LAB — MICROALBUMIN / CREATININE URINE RATIO
Creatinine, Urine: 116.8 mg/dL
Microalb Creat Ratio: 8.2 mg/g (ref 0.0–30.0)
Microalb, Ur: 0.96 mg/dL (ref 0.00–1.89)

## 2012-01-10 LAB — LIPASE, BLOOD: Lipase: 48 U/L (ref 11–59)

## 2012-01-10 LAB — HIV ANTIBODY (ROUTINE TESTING W REFLEX): HIV: NONREACTIVE

## 2012-01-10 MED ORDER — OMEPRAZOLE 20 MG PO CPDR: 20.0000 mg | DELAYED_RELEASE_CAPSULE | Freq: Every day | ORAL | Status: DC

## 2012-01-10 MED ORDER — GI COCKTAIL ~~LOC~~: 30.0000 mL | Freq: Two times a day (BID) | ORAL | Status: DC | PRN

## 2012-01-10 NOTE — Progress Notes (Signed)
Subjective: Patient feels completely well this morning and is very anxious to leave.  Discussed switching from an H2 blocker to PPI and gave patient prescription for omeprazole.  Patient also requested we prescribe a GI cocktail and that has been very helpful in the past.  Patient counseled to abstain from alcohol and to avoid GI irritating medications such as aspirin and NSAIDs.  Was also advised to follow up with our clinic in 2 weeks and appointment was made for him.   Objective: Vital signs in last 24 hours: Filed Vitals:   01/09/12 1508 01/09/12 1630 01/09/12 2025 01/10/12 0600  BP: 143/80 154/86 140/84 158/95  Pulse: 60 54 60 78  Temp:  97.8 F (36.6 C) 98.2 F (36.8 C) 98 F (36.7 C)  TempSrc:  Oral    Resp: 18 19 18 16   Height:  5\' 9"  (1.753 m)    Weight:  82.101 kg (181 lb)    SpO2: 97% 100% 100% 98%   Weight change:   Intake/Output Summary (Last 24 hours) at 01/10/12 1455 Last data filed at 01/10/12 0600  Gross per 24 hour  Intake 1748.75 ml  Output   1550 ml  Net 198.75 ml   Physical Exam: BP 158/95  Pulse 78  Temp(Src) 98 F (36.7 C) (Oral)  Resp 16  Ht 5\' 9"  (1.753 m)  Wt 82.101 kg (181 lb)  BMI 26.73 kg/m2  SpO2 98% General appearance: alert, cooperative and patient left before full exam could be completed Lungs: clear to auscultation bilaterally Heart: regular rate and rhythm, S1, S2 normal, no murmur, click, rub or gallop Abdomen: soft, non-tender Lab Results: Admission on 01/09/2012, Discharged on 01/10/2012  Component Date Value Range Status  . WBC (K/uL) 01/09/2012 7.3  4.0-10.5 Final  . RBC (MIL/uL) 01/09/2012 6.15* 4.22-5.81 Final  . Hemoglobin (g/dL) 40/98/1191 47.8  29.5-62.1 Final  . HCT (%) 01/09/2012 44.4  39.0-52.0 Final  . MCV (fL) 01/09/2012 72.2* 78.0-100.0 Final  . MCH (pg) 01/09/2012 24.4* 26.0-34.0 Final  . MCHC (g/dL) 30/86/5784 69.6  29.5-28.4 Final  . RDW (%) 01/09/2012 13.5  11.5-15.5 Final  . Platelets (K/uL) 01/09/2012 203   150-400 Final  . Neutrophils Relative (%) 01/09/2012 39* 43-77 Final  . Lymphocytes Relative (%) 01/09/2012 49* 12-46 Final  . Monocytes Relative (%) 01/09/2012 10  3-12 Final  . Eosinophils Relative (%) 01/09/2012 1  0-5 Final  . Basophils Relative (%) 01/09/2012 1  0-1 Final  . Neutro Abs (K/uL) 01/09/2012 2.8  1.7-7.7 Final  . Lymphs Abs (K/uL) 01/09/2012 3.6  0.7-4.0 Final  . Monocytes Absolute (K/uL) 01/09/2012 0.7  0.1-1.0 Final  . Eosinophils Absolute (K/uL) 01/09/2012 0.1  0.0-0.7 Final  . Basophils Absolute (K/uL) 01/09/2012 0.1  0.0-0.1 Final  . Smear Review  01/09/2012 MORPHOLOGY UNREMARKABLE   Final  . Sodium (mEq/L) 01/09/2012 138  135-145 Final  . Potassium (mEq/L) 01/09/2012 4.4  3.5-5.1 Final  . Chloride (mEq/L) 01/09/2012 102  96-112 Final  . CO2 (mEq/L) 01/09/2012 27  19-32 Final  . Glucose, Bld (mg/dL) 13/24/4010 272* 53-66 Final  . BUN (mg/dL) 44/02/4741 11  5-95 Final  . Creatinine, Ser (mg/dL) 63/87/5643 3.29  5.18-8.41 Final  . Calcium (mg/dL) 66/05/3015 9.5  0.1-09.3 Final  . Total Protein (g/dL) 23/55/7322 7.4  0.2-5.4 Final  . Albumin (g/dL) 27/05/2375 3.4* 2.8-3.1 Final  . AST (U/L) 01/09/2012 23  0-37 Final  . ALT (U/L) 01/09/2012 23  0-53 Final  . Alkaline Phosphatase (U/L) 01/09/2012 101  39-117 Final  .  Total Bilirubin (mg/dL) 16/09/9603 0.4  5.4-0.9 Final  . GFR calc non Af Amer (mL/min) 01/09/2012 >90  >90 Final  . GFR calc Af Amer (mL/min) 01/09/2012 >90  >90 Final  . Lipase (U/L) 01/09/2012 71* 11-59 Final  . Color, Urine  01/09/2012 YELLOW  YELLOW Final  . APPearance  01/09/2012 CLEAR  CLEAR Final  . Specific Gravity, Urine  01/09/2012 1.022  1.005-1.030 Final  . pH  01/09/2012 6.5  5.0-8.0 Final  . Glucose, UA (mg/dL) 81/19/1478 NEGATIVE  NEGATIVE Final  . Hgb urine dipstick  01/09/2012 NEGATIVE  NEGATIVE Final  . Bilirubin Urine  01/09/2012 NEGATIVE  NEGATIVE Final  . Ketones, ur (mg/dL) 29/56/2130 NEGATIVE  NEGATIVE Final  . Protein, ur  (mg/dL) 86/57/8469 NEGATIVE  NEGATIVE Final  . Urobilinogen, UA (mg/dL) 62/95/2841 1.0  3.2-4.4 Final  . Nitrite  01/09/2012 NEGATIVE  NEGATIVE Final  . Leukocytes, UA  01/09/2012 NEGATIVE  NEGATIVE Final  . Troponin i, poc (ng/mL) 01/09/2012 0.00  0.00-0.08 Final  . Cholesterol (mg/dL) 12/19/7251 664  4-034 Final  . Triglycerides (mg/dL) 74/25/9563 96  <875 Final  . HDL (mg/dL) 64/33/2951 41  >88 Final  . Total CHOL/HDL Ratio (RATIO) 01/09/2012 4.0   Final  . VLDL (mg/dL) 41/66/0630 19  1-60 Final  . LDL Cholesterol (mg/dL) 10/93/2355 732* 2-02 Final  . Hemoglobin A1C (%) 01/09/2012 8.1* <5.7 Final  . Mean Plasma Glucose (mg/dL) 54/27/0623 762* <831 Final  . WBC (K/uL) 01/09/2012 7.3  4.0-10.5 Final  . RBC (MIL/uL) 01/09/2012 6.18* 4.22-5.81 Final  . Hemoglobin (g/dL) 51/76/1607 37.1  06.2-69.4 Final  . HCT (%) 01/09/2012 45.0  39.0-52.0 Final  . MCV (fL) 01/09/2012 72.8* 78.0-100.0 Final  . MCH (pg) 01/09/2012 24.3* 26.0-34.0 Final  . MCHC (g/dL) 85/46/2703 50.0  93.8-18.2 Final  . RDW (%) 01/09/2012 13.7  11.5-15.5 Final  . Platelets (K/uL) 01/09/2012 219  150-400 Final  . Creatinine, Ser (mg/dL) 99/37/1696 7.89  3.81-0.17 Final  . GFR calc non Af Amer (mL/min) 01/09/2012 >90  >90 Final  . GFR calc Af Amer (mL/min) 01/09/2012 >90  >90 Final  . TSH (uIU/mL) 01/09/2012 1.093  0.350-4.500 Final  . Cholesterol (mg/dL) 51/01/5851 778  2-423 Final  . Triglycerides (mg/dL) 53/61/4431 540  <086 Final  . HDL (mg/dL) 76/19/5093 40  >26 Final  . Total CHOL/HDL Ratio (RATIO) 01/09/2012 4.3   Final  . VLDL (mg/dL) 71/24/5809 27  9-83 Final  . LDL Cholesterol (mg/dL) 38/25/0539 767* 3-41 Final  . HIV  01/09/2012 NON REACTIVE  NON REACTIVE Final  . Opiates  01/09/2012 NONE DETECTED  NONE DETECTED Final  . Cocaine  01/09/2012 NONE DETECTED  NONE DETECTED Final  . Benzodiazepines  01/09/2012 NONE DETECTED  NONE DETECTED Final  . Amphetamines  01/09/2012 NONE DETECTED  NONE DETECTED Final  .  Tetrahydrocannabinol  01/09/2012 POSITIVE* NONE DETECTED Final  . Barbiturates  01/09/2012 POSITIVE* NONE DETECTED Final  . Microalb, Ur (mg/dL) 93/79/0240 9.73  5.32-9.92 Final  . Creatinine, Urine (mg/dL) 42/68/3419 622.2   Final  . Microalb Creat Ratio (mg/g) 01/09/2012 8.2  0.0-30.0 Final  . WBC (K/uL) 01/10/2012 6.6  4.0-10.5 Final  . RBC (MIL/uL) 01/10/2012 6.36* 4.22-5.81 Final  . Hemoglobin (g/dL) 97/98/9211 94.1  74.0-81.4 Final  . HCT (%) 01/10/2012 46.5  39.0-52.0 Final  . MCV (fL) 01/10/2012 73.1* 78.0-100.0 Final  . MCH (pg) 01/10/2012 23.6* 26.0-34.0 Final  . MCHC (g/dL) 48/18/5631 49.7  02.6-37.8 Final  . RDW (%) 01/10/2012 13.8  11.5-15.5 Final  .  Platelets (K/uL) 01/10/2012 218  150-400 Final  . Sodium (mEq/L) 01/10/2012 137  135-145 Final  . Potassium (mEq/L) 01/10/2012 4.0  3.5-5.1 Final  . Chloride (mEq/L) 01/10/2012 101  96-112 Final  . CO2 (mEq/L) 01/10/2012 27  19-32 Final  . Glucose, Bld (mg/dL) 16/09/9603 540* 98-11 Final  . BUN (mg/dL) 91/47/8295 8  6-21 Final  . Creatinine, Ser (mg/dL) 30/86/5784 6.96  2.95-2.84 Final  . Calcium (mg/dL) 13/24/4010 9.6  2.7-25.3 Final  . Total Protein (g/dL) 66/44/0347 7.1  4.2-5.9 Final  . Albumin (g/dL) 56/38/7564 3.4* 3.3-2.9 Final  . AST (U/L) 01/10/2012 22  0-37 Final  . ALT (U/L) 01/10/2012 21  0-53 Final  . Alkaline Phosphatase (U/L) 01/10/2012 98  39-117 Final  . Total Bilirubin (mg/dL) 51/88/4166 0.8  0.6-3.0 Final  . GFR calc non Af Amer (mL/min) 01/10/2012 >90  >90 Final  . GFR calc Af Amer (mL/min) 01/10/2012 >90  >90 Final  . Lipase (U/L) 01/10/2012 48  11-59 Final  . Hemoglobin A1C (%) 01/10/2012 7.6* <5.7 Final  . Mean Plasma Glucose (mg/dL) 16/12/930 355* <732 Final  . Glucose-Capillary (mg/dL) 20/25/4270 623* 76-28 Final  . Comment 1  01/09/2012 Notify RN   Final  . Glucose-Capillary (mg/dL) 31/51/7616 073* 71-06 Final  . Glucose-Capillary (mg/dL) 26/94/8546 270* 35-00 Final    Micro Results: No  results found for this or any previous visit (from the past 240 hour(s)). Studies/Results: US Abdomen Complete  01/09/2012  *RADIOLOGY REPORT*  Clinical Data:  Pancreatitis, abdominal pain.  COMPLETE ABDOMINAL ULTRASOUND  Comparison:  None.  Findings:  Gallbladder:  Single small echogenic focus with shadowing within the gallbladder compatible with small stone.  Gallbladder wall is borderline thickened at 3 mm.  Negative sonographic Murphy's.  Common bile duct:   Within normal limits in caliber.  Liver:  No focal lesion identified.  Within normal limits in parenchymal echogenicity.  IVC:  Appears normal.  Pancreas:  Not well visualized due to overlying bowel gas.  Spleen:  Within normal limits in size and echotexture.  Right Kidney:   Normal in size and parenchymal echogenicity.  No evidence of mass or hydronephrosis.  Left Kidney:  Normal in size and parenchymal echogenicity.  No evidence of mass or hydronephrosis.  Abdominal aorta:  No aneurysm identified.  IMPRESSION: Single small mobile gallstone.  Borderline gallbladder wall thickness without sonographic Murphy's sign.  Original Report Authenticated By: Cyndie Chime, M.D.   Ct Abdomen Pelvis W Contrast  01/09/2012  *RADIOLOGY REPORT*  Clinical Data: Epigastric pain.  Pancreatitis.  The nausea and vomiting.  Fever.  CT ABDOMEN AND PELVIS WITH CONTRAST  Technique:  Multidetector CT imaging of the abdomen and pelvis was performed following the standard protocol during bolus administration of intravenous contrast.  Contrast: 40mL OMNIPAQUE IOHEXOL 300 MG/ML IV SOLN, 1 OMNIPAQUE IOHEXOL 300 MG/ML IV SOLN, OMNIPAQUE IOHEXOL 300 MG/ML IV SOLN  Comparison: Abdominal ultrasound of 01/09/2012.  Findings:  The lung bases are clear without focal nodule, mass, or airspace disease.  The heart size is normal.  No significant pleural or pericardial effusion is present.  The liver and spleen are within normal limits.  There is diffuse wall thickening throughout the  stomach, particularly in the antrum extending to the duodenal bulb.  The more distal duodenum is unremarkable.  The pancreas is within normal limits.  The common bile duct and gallbladder are unremarkable.  The adrenal glands are normal bilaterally.  The kidneys and ureters are unremarkable.  The rectosigmoid colon  is within normal limits.  The remainder of the colon is unremarkable.  The appendix is visualized and within normal limits.  The small bowel is unremarkable.  No significant adenopathy or free fluid is present.  The bone windows are unremarkable.  IMPRESSION:  1.  Edematous inflammatory changes the distal stomach or proximal duodenum compatible with gastroduodenitis. 2.  No focal abnormality of the pancreas. 3.  The gallbladder is unremarkable. 4.  Mild atherosclerotic changes.  Original Report Authenticated By: Jamesetta Orleans. MATTERN, M.D.   Medications: I have reviewed the patient's current medications. Scheduled Meds:   . iohexol  20 mL Intravenous Q1 Hr x 2  . DISCONTD: heparin  5,000 Units Subcutaneous Q8H  . DISCONTD: influenza  inactive virus vaccine  0.5 mL Intramuscular Tomorrow-1000  . DISCONTD: insulin aspart  0-9 Units Subcutaneous TID WC  . DISCONTD: pantoprazole  40 mg Oral Q1200  . DISCONTD: pantoprazole  40 mg Oral BID AC  . DISCONTD: pneumococcal 23 valent vaccine  0.5 mL Intramuscular Tomorrow-1000   Continuous Infusions:   . DISCONTD: sodium chloride 75 mL/hr at 01/09/12 1617   PRN Meds:.iohexol, iohexol, DISCONTD: acetaminophen, DISCONTD: acetaminophen, DISCONTD: bismuth subsalicylate, DISCONTD:  HYDROmorphone (DILAUDID) injection, DISCONTD: morphine Assessment/Plan:  Pancreatitis: Pt has had abdominal pain, nausea and vomiting ongoing for 4 days. US shows single gallstone with negative murphy's sign. Labs show slightly elevated lipase on admission which is consistent with diagnosis of pancreatitis, also concern for cholelithiasis, cholecystitis, gastris, peptic  ulcer disease though less likely the cause of his current complaint. The patient is currently hemodynamically stable.  -symptoms resolved this AM per patient and patient anxious to leave, left prior to full exam and official discharge Plan  -IV fluids for hydration.  -zofran prn nausea  -CBC, lipase, lipids within normal limits -CT shows gastroduodenitis-PPI started -US showed single mobile gallstone -surgery consult-no cholecystectomy indicated at this time -d/c home ranitidine and discharge with PPI   Diabetes mellitus, type 2- unknown  -hold home medications  -SSI for glucose control  -A1c in AM  -consult diabetes educator  -offer flu vaccine and pneumovax  -carb modified diet once no longer NPO  -will attempt to get records from hospital and PCP in [place], [place].   Dispo: home when tolerating oral intake and when pain in under control. Recommend follow up with internal medicine clinic in 2 weeks.   LOS: 1 day   Dwaine Gale 01/10/2012, 2:55 PM

## 2012-01-10 NOTE — Discharge Summary (Signed)
Internal Medicine [facility] Discharge Note  Name: Donald Palmer MRN: [id number] DOB: 03/24/1961 51 y.o.  Date of Admission: 01/09/2012  7:13 AM Date of Discharge: 01/10/2012 Attending Physician: Dr. Maurice March  Discharge Diagnosis: gastroduodenitis- patient was found to have gastroduodenitis per CT scan, patient left hospital without further work up. Prescribed PPI instead of ranitidine. Diabetes Mellitus: under reasonable control with oral agents Hgb A1c of 8.1%. Scheduled to follow up with our primary care clinic since he did not have a provider in the area after returning from [place].  Discharge Medications: Medication List  As of 01/10/2012  1:41 PM   STOP taking these medications         ranitidine 150 MG tablet         TAKE these medications         bismuth subsalicylate 262 MG/15ML suspension   Commonly known as: PEPTO BISMOL   Take 15 mLs by mouth every 6 (six) hours as needed. For stomach      gi cocktail Susp   Take 30 mLs by mouth 2 (two) times daily as needed.      metFORMIN 500 MG tablet   Commonly known as: GLUCOPHAGE   Take 500 mg by mouth 2 (two) times daily with a meal.      pioglitazone 15 MG tablet   Commonly known as: ACTOS   Take 15 mg by mouth daily.            Disposition and follow-up:   [name] was discharged from [facility] in improved condition.    Follow-up Appointments: Follow-up Information    Follow up with SAWHNEY,MEGHA, MD on 01/30/2012.   Contact information:   [address] [place] [address] [phone]         Discharge Orders    Future Appointments: Provider: Department: Dept Phone: Center:   01/30/2012 8:45 AM Elyse Jarvis, MD [facility] [phone] [facility]     Future Orders Please Complete By Expires   Diet - low sodium heart healthy      Increase activity slowly      Discharge instructions      Comments:   Please do NOT take aspirin, ibuprofen, motrin or  aleeve at any time. Reduce alcohol and caffeine use as much as possible.   Call MD for:  persistant nausea and vomiting      Call MD for:  severe uncontrolled pain        Consults: general surgery  Procedures Performed:  US Abdomen Complete  01/09/2012  *RADIOLOGY REPORT*  Clinical Data:  Pancreatitis, abdominal pain.  COMPLETE ABDOMINAL ULTRASOUND  Comparison:  None.  Findings:  Gallbladder:  Single small echogenic focus with shadowing within the gallbladder compatible with small stone.  Gallbladder wall is borderline thickened at 3 mm.  Negative sonographic Murphy's.  Common bile duct:   Within normal limits in caliber.  Liver:  No focal lesion identified.  Within normal limits in parenchymal echogenicity.  IVC:  Appears normal.  Pancreas:  Not well visualized due to overlying bowel gas.  Spleen:  Within normal limits in size and echotexture.  Right Kidney:   Normal in size and parenchymal echogenicity.  No evidence of mass or hydronephrosis.  Left Kidney:  Normal in size and parenchymal echogenicity.  No evidence of mass or hydronephrosis.  Abdominal aorta:  No aneurysm identified.  IMPRESSION: Single small mobile gallstone.  Borderline gallbladder wall thickness without sonographic Murphy's sign.  Original Report  Authenticated By: Cyndie Chime, M.D.   Ct Abdomen Pelvis W Contrast  01/09/2012  *RADIOLOGY REPORT*  Clinical Data: Epigastric pain.  Pancreatitis.  The nausea and vomiting.  Fever.  CT ABDOMEN AND PELVIS WITH CONTRAST  Technique:  Multidetector CT imaging of the abdomen and pelvis was performed following the standard protocol during bolus administration of intravenous contrast.  Contrast: 40mL OMNIPAQUE IOHEXOL 300 MG/ML IV SOLN, 1 OMNIPAQUE IOHEXOL 300 MG/ML IV SOLN, OMNIPAQUE IOHEXOL 300 MG/ML IV SOLN  Comparison: Abdominal ultrasound of 01/09/2012.  Findings:  The lung bases are clear without focal nodule, mass, or airspace disease.  The heart size is normal.  No significant  pleural or pericardial effusion is present.  The liver and spleen are within normal limits.  There is diffuse wall thickening throughout the stomach, particularly in the antrum extending to the duodenal bulb.  The more distal duodenum is unremarkable.  The pancreas is within normal limits.  The common bile duct and gallbladder are unremarkable.  The adrenal glands are normal bilaterally.  The kidneys and ureters are unremarkable.  The rectosigmoid colon is within normal limits.  The remainder of the colon is unremarkable.  The appendix is visualized and within normal limits.  The small bowel is unremarkable.  No significant adenopathy or free fluid is present.  The bone windows are unremarkable.  IMPRESSION:  1.  Edematous inflammatory changes the distal stomach or proximal duodenum compatible with gastroduodenitis. 2.  No focal abnormality of the pancreas. 3.  The gallbladder is unremarkable. 4.  Mild atherosclerotic changes.  Original Report Authenticated By: Jamesetta Orleans. MATTERN, M.D.    Admission HPI:  Patient is a 51 y.o. man with past medical history of DM2 and what sounds like GERD, however, patient does not recall precise diagnosis. He presents to the ED with abdominal pain which started Saturday night. He had been out a club with friends where he had about 4 beers and then stopped for mcd[employer] fish sandwich. After eating he was sick and vomited. Since he has had decreased appetite though he reports that he has been trying to drink ginger ale and will eat a small amount. He continued to have episodes of vomiting Sunday and Monday, but it seems to have subsided, however, the pain has become unbearable. He denies any constipation, diarrhea. He denies melanotic stools or rectal bleeding. He denies any back pain, chest pain, shortness of breath. He does describe the pain as being across his upper abdomen and sharply radiating upwards to the top of his head. Unclear if he has visual changes with the pain,  but does describe "numbness" of his face. No fevers or chills. He denies any prior abdominal surgeries in the past.   Hospital Course by problem list:  *Pancreatitis: Patient presented with abdominal pain, nausea, and vomiting beginning 4 days prior to admission with onset within a few hours of drinking 4-5 beers.  Patient denied hematemesis, SOB, chest pain, syncope, diarrhea, constipation, hematochezia, or fever.  Patient was given treated with pain medication and IVF initially.  Laboratory investigation yielded a mildly elevated lipase, slightly low albumin, but otherwise CBC, BMP, and vitals were all within normal limits.  US showed a single small gallstone and mild wall thickening.  CT was performed and showed gastroduodenitis, but no signs of pancreatic inflammation.  Patient was switched from his home ranitidine to protonix and given a GI cocktail.  Patient was prescribed omeprazole 20mg  daily and GI cocktail 30ml twice daily as needed for  abdominal pain to take at home as well.  These were discussed with the patient before he left and the complaint of abdominal pain was reported by the patient to have resolved.  Lipase had returned to normal range by next morning. Etiology of abdominal pain is believed to be primarily gastroduodenitis despite risk factors of gallstones and alcohol use for pancreatitis.  Surgery was consulted on admission and felt cholecystectomy was not indicated at this time.  Recommended no alcohol, NSAID or aspirin use, however, patient left before being formally discharged and receiving his discharge instructions.  May consider H. Pylori testing as an outpatient if symptoms do not resolve with PPI. Breath testing and antibody test not available as an inpatient, only biopsy testing available.   Diabetes mellitus: patient's sugars were in reasonable control, with a random glucose of 148 on admission and Hgb A1C of 8.1%.  Patient's home meds of actos and metformin were held and during  admission patient's sugars were controlled with sliding scale insulin. No episodes of hyperglycemia or hypoglycemia during hospitalization.  Discharge Vitals:  BP 158/95  Pulse 78  Temp(Src) 98 F (36.7 C) (Oral)  Resp 16  Ht 5\' 9"  (1.753 m)  Wt 82.101 kg (181 lb)  BMI 26.73 kg/m2  SpO2 98%  Discharge Labs:  Results for orders placed during the hospital encounter of 01/09/12 (from the past 24 hour(s))  LIPID PANEL     Status: Abnormal   Collection Time   01/09/12  4:29 PM      Component Value Range   Cholesterol 171  0 - 200 (mg/dL)   Triglycerides 045  <409 (mg/dL)   HDL 40  >81 (mg/dL)   Total CHOL/HDL Ratio 4.3     VLDL 27  0 - 40 (mg/dL)   LDL Cholesterol 191 (*) 0 - 99 (mg/dL)  HEMOGLOBIN Y7W     Status: Abnormal   Collection Time   01/09/12  4:30 PM      Component Value Range   Hemoglobin A1C 8.1 (*) <5.7 (%)   Mean Plasma Glucose 186 (*) <117 (mg/dL)  CBC     Status: Abnormal   Collection Time   01/09/12  4:30 PM      Component Value Range   WBC 7.3  4.0 - 10.5 (K/uL)   RBC 6.18 (*) 4.22 - 5.81 (MIL/uL)   Hemoglobin 15.0  13.0 - 17.0 (g/dL)   HCT 29.5  62.1 - 30.8 (%)   MCV 72.8 (*) 78.0 - 100.0 (fL)   MCH 24.3 (*) 26.0 - 34.0 (pg)   MCHC 33.3  30.0 - 36.0 (g/dL)   RDW 65.7  84.6 - 96.2 (%)   Platelets 219  150 - 400 (K/uL)  CREATININE, SERUM     Status: Normal   Collection Time   01/09/12  4:30 PM      Component Value Range   Creatinine, Ser 0.69  0.50 - 1.35 (mg/dL)   GFR calc non Af Amer >90  >90 (mL/min)   GFR calc Af Amer >90  >90 (mL/min)  TSH     Status: Normal   Collection Time   01/09/12  4:30 PM      Component Value Range   TSH 1.093  0.350 - 4.500 (uIU/mL)  HIV ANTIBODY (ROUTINE TESTING)     Status: Normal   Collection Time   01/09/12  4:30 PM      Component Value Range   HIV NON REACTIVE  NON REACTIVE   URINE RAPID DRUG  SCREEN (HOSP PERFORMED)     Status: Abnormal   Collection Time   01/09/12  4:32 PM      Component Value Range   Opiates  NONE DETECTED  NONE DETECTED    Cocaine NONE DETECTED  NONE DETECTED    Benzodiazepines NONE DETECTED  NONE DETECTED    Amphetamines NONE DETECTED  NONE DETECTED    Tetrahydrocannabinol POSITIVE (*) NONE DETECTED    Barbiturates POSITIVE (*) NONE DETECTED   MICROALBUMIN / CREATININE URINE RATIO     Status: Normal   Collection Time   01/09/12  4:32 PM      Component Value Range   Microalb, Ur 0.96  0.00 - 1.89 (mg/dL)   Creatinine, Urine 960.4     Microalb Creat Ratio 8.2  0.0 - 30.0 (mg/g)  GLUCOSE, CAPILLARY     Status: Abnormal   Collection Time   01/09/12  5:30 PM      Component Value Range   Glucose-Capillary 111 (*) 70 - 99 (mg/dL)   Comment 1 Notify RN    GLUCOSE, CAPILLARY     Status: Abnormal   Collection Time   01/09/12  9:34 PM      Component Value Range   Glucose-Capillary 112 (*) 70 - 99 (mg/dL)  CBC     Status: Abnormal   Collection Time   01/10/12  5:05 AM      Component Value Range   WBC 6.6  4.0 - 10.5 (K/uL)   RBC 6.36 (*) 4.22 - 5.81 (MIL/uL)   Hemoglobin 15.0  13.0 - 17.0 (g/dL)   HCT 54.0  98.1 - 19.1 (%)   MCV 73.1 (*) 78.0 - 100.0 (fL)   MCH 23.6 (*) 26.0 - 34.0 (pg)   MCHC 32.3  30.0 - 36.0 (g/dL)   RDW 47.8  29.5 - 62.1 (%)   Platelets 218  150 - 400 (K/uL)  COMPREHENSIVE METABOLIC PANEL     Status: Abnormal   Collection Time   01/10/12  5:05 AM      Component Value Range   Sodium 137  135 - 145 (mEq/L)   Potassium 4.0  3.5 - 5.1 (mEq/L)   Chloride 101  96 - 112 (mEq/L)   CO2 27  19 - 32 (mEq/L)   Glucose, Bld 108 (*) 70 - 99 (mg/dL)   BUN 8  6 - 23 (mg/dL)   Creatinine, Ser 3.08  0.50 - 1.35 (mg/dL)   Calcium 9.6  8.4 - 65.7 (mg/dL)   Total Protein 7.1  6.0 - 8.3 (g/dL)   Albumin 3.4 (*) 3.5 - 5.2 (g/dL)   AST 22  0 - 37 (U/L)   ALT 21  0 - 53 (U/L)   Alkaline Phosphatase 98  39 - 117 (U/L)   Total Bilirubin 0.8  0.3 - 1.2 (mg/dL)   GFR calc non Af Amer >90  >90 (mL/min)   GFR calc Af Amer >90  >90 (mL/min)  LIPASE, BLOOD     Status: Normal    Collection Time   01/10/12  5:05 AM      Component Value Range   Lipase 48  11 - 59 (U/L)  HEMOGLOBIN A1C     Status: Abnormal   Collection Time   01/10/12  5:05 AM      Component Value Range   Hemoglobin A1C 7.6 (*) <5.7 (%)   Mean Plasma Glucose 171 (*) <117 (mg/dL)

## 2012-01-10 NOTE — Progress Notes (Signed)
CT c/w gastritis and duodenitis.  He has asx cholelithiasis in my opinion and does not need a cholecystectomy at this time.

## 2012-01-10 NOTE — Progress Notes (Signed)
[name], male   DOB: 1961-01-26, 51 y.o.   MRN: [id number]   LOS: 1 day   Subjective: No c/o. Feels better and wants to go home. Denies pain, N/V.  Objective: Vital signs in last 24 hours: Temp:  [97.8 F (36.6 C)-98.2 F (36.8 C)] 98 F (36.7 C) (01/24 0600) Pulse Rate:  [54-78] 78  (01/24 0600) Resp:  [14-19] 16  (01/24 0600) BP: (131-158)/(80-99) 158/95 mmHg (01/24 0600) SpO2:  [96 %-100 %] 98 % (01/24 0600) Weight:  [82.101 kg (181 lb)] 82.101 kg (181 lb) (01/23 1630) Last BM Date: 01/07/12  Lab Results:  CBC  Basename 01/10/12 0505 01/09/12 1630  WBC 6.6 7.3  HGB 15.0 15.0  HCT 46.5 45.0  PLT 218 219   BMET  Basename 01/10/12 0505 01/09/12 1630 01/09/12 0850  NA 137 -- 138  K 4.0 -- 4.4  CL 101 -- 102  CO2 27 -- 27  GLUCOSE 108* -- 148*  BUN 8 -- 11  CREATININE 0.78 0.69 --  CALCIUM 9.6 -- 9.5    General appearance: alert and no distress Resp: clear to auscultation bilaterally Cardio: regular rate and rhythm GI: normal findings: bowel sounds normal and soft, minimal diffuse TTP (described as "tingle")  Assessment/Plan: Abdominal pain -- Resolved. No surgical indication at this time. Surgery will sign off.   Freeman Caldron, PA-C Pager: [phone]   01/10/2012

## 2012-01-23 ENCOUNTER — Other Ambulatory Visit: Payer: Self-pay | Admitting: *Deleted

## 2012-01-30 ENCOUNTER — Encounter: Payer: Self-pay | Admitting: Internal Medicine

## 2012-01-30 ENCOUNTER — Ambulatory Visit (INDEPENDENT_AMBULATORY_CARE_PROVIDER_SITE_OTHER): Payer: Self-pay | Admitting: Internal Medicine

## 2012-01-30 DIAGNOSIS — R51 Headache: Secondary | ICD-10-CM

## 2012-01-30 DIAGNOSIS — Z8679 Personal history of other diseases of the circulatory system: Secondary | ICD-10-CM | POA: Insufficient documentation

## 2012-01-30 DIAGNOSIS — R109 Unspecified abdominal pain: Secondary | ICD-10-CM

## 2012-01-30 DIAGNOSIS — E119 Type 2 diabetes mellitus without complications: Secondary | ICD-10-CM

## 2012-01-30 DIAGNOSIS — E1149 Type 2 diabetes mellitus with other diabetic neurological complication: Secondary | ICD-10-CM

## 2012-01-30 DIAGNOSIS — K219 Gastro-esophageal reflux disease without esophagitis: Secondary | ICD-10-CM

## 2012-01-30 DIAGNOSIS — Z139 Encounter for screening, unspecified: Secondary | ICD-10-CM | POA: Insufficient documentation

## 2012-01-30 DIAGNOSIS — E1142 Type 2 diabetes mellitus with diabetic polyneuropathy: Secondary | ICD-10-CM

## 2012-01-30 DIAGNOSIS — I10 Essential (primary) hypertension: Secondary | ICD-10-CM

## 2012-01-30 DIAGNOSIS — R519 Headache, unspecified: Secondary | ICD-10-CM | POA: Insufficient documentation

## 2012-01-30 DIAGNOSIS — E1140 Type 2 diabetes mellitus with diabetic neuropathy, unspecified: Secondary | ICD-10-CM | POA: Insufficient documentation

## 2012-01-30 LAB — GLUCOSE, CAPILLARY: Glucose-Capillary: 158 mg/dL — ABNORMAL HIGH (ref 70–99)

## 2012-01-30 MED ORDER — OMEPRAZOLE 20 MG PO CPDR: 20.0000 mg | DELAYED_RELEASE_CAPSULE | Freq: Every day | ORAL | Status: DC

## 2012-01-30 MED ORDER — METFORMIN HCL 1000 MG PO TABS: 500.0000 mg | ORAL_TABLET | Freq: Two times a day (BID) | ORAL | Status: DC

## 2012-01-30 MED ORDER — LISINOPRIL 5 MG PO TABS: 5.0000 mg | ORAL_TABLET | Freq: Every day | ORAL | Status: DC

## 2012-01-30 MED ORDER — METFORMIN HCL 1000 MG PO TABS: ORAL_TABLET | ORAL | Status: DC

## 2012-01-30 MED ORDER — GABAPENTIN 300 MG PO CAPS: ORAL_CAPSULE | ORAL | Status: DC

## 2012-01-30 NOTE — Assessment & Plan Note (Signed)
He reports chronic daily headaches for last year or so usually in the morning and gets better when he takes Tylenol or NSAIDs( before the hospitalization ). Differentials include medication overuse headaches versus migraine versus cluster versus tension headaches. I suspect these are most likely medication overuse headaches given the chronicity and the medication use. In the absence of the classic history for migraines( no association with noise, light , aura, frequency), in the absense of any lacrimation, rhinorrhea migraines or cluster headaches are less likely. - Continue to monitor for now. - Was advised not to take Tylenol on daily basis.

## 2012-01-30 NOTE — Assessment & Plan Note (Signed)
He complains of bilateral lower extremity pain associated with tingling and numbness.Neuro exam was completely benign. I think this is most likely related to diabetic neuropathy. -Start him on Neurontin. He was given the instructions on how to gradually increase the dose of Neurontin. He was educated on the side effects and was advised to call the clinic if he experiences any side effects from the new medication.

## 2012-01-30 NOTE — Patient Instructions (Signed)
Please schedule a follow up appointment in 1-2 months . Please bring your medication bottles with your next appointment. Please take your medicines as prescribed. I will call you with your lab results if anything will be abnormal. 

## 2012-01-30 NOTE — Assessment & Plan Note (Addendum)
Hospital followup for abdominal pain thought to be likely related to gastroduodenitis from NSAIDs, alcohol use. He denies any abdominal pain at today's visit though reports occasional nausea but denies any vomiting, blood in stools. - We'll not initiate any further workup like H. pylori testing. -Continue omeprazole for now.  Of note when the patient went to the lab to drop his urine specimen,he mentioned that he would like to be screened for hepatitis because he was told about back in 2000 when he donated plasma that he has Hep B. He was educated that his liver function test were normal when checked back in January of 2012 but would screen him for hepatitis as per his preference.

## 2012-01-30 NOTE — Progress Notes (Signed)
  Subjective:    [name], male    DOB: [birth year], 51 y.o.   MRN: [id number]  HPI: 51 year old man with past medical history significant for diabetes comes to the clinic for hospital followup.  1)Abdominal pain: Patient was recently hospitalized from 01/09/2012 to 01/10/2012 for abdominal pain possibly related to  gastroduodenitis. Patient states that he was taking  lot of Motrin and other NSAIDs for his leg pain and also drank a few drinks prior to the hospital admission but has quit drinking now. He denies any abdominal pain today but reports occasional nausea. Also denies any vomiting, blood in the stools, dizziness.  2) Diabetes: He reports that he has been having pain in both of his legs which he correlates with the onset of his diabetes. He describes it as aching discomfort associated with some tingling and numbness he states that sometimes he couldn't feel anything in his legs. He states that his pain is more worse in the mornings when he gets up  and usually gets better during the day. He also reports that he does not have enough money to buy his medication and  has been sharing his medications with one of his family member but did not disclose the relationship.  3) Headaches: He also reports some headaches in the morning, located in the frontal region, at a frequency of almost every day. He takes Tylenol  that makes it better.   He requests to be screened for hepatitis because apparently he was told back in 1999- 2000 that he was hepatitis B positive and he was donating plasma.    Review of Systems  Constitutional: Negative for chills and fatigue.  HENT: Negative for ear pain, nosebleeds and postnasal drip.   Respiratory: Negative for cough, choking, chest tightness, shortness of breath, wheezing and stridor.   Gastrointestinal: Negative for abdominal pain, diarrhea, constipation and blood in stool.  Musculoskeletal: Positive for arthralgias.  Neurological: Positive for  numbness and headaches. Negative for dizziness and light-headedness.       Objective:   Physical Exam  Constitutional: He is oriented to person, place, and time. He appears well-developed and well-nourished. No distress.  HENT:  Head: Normocephalic and atraumatic.  Mouth/Throat: No oropharyngeal exudate.  Eyes: Conjunctivae and EOM are normal. Pupils are equal, round, and reactive to light. No scleral icterus.  Neck: Normal range of motion. Neck supple. No JVD present. No tracheal deviation present. No thyromegaly present.  Cardiovascular: Normal rate and regular rhythm.  Exam reveals no gallop and no friction rub.   No murmur heard. Pulmonary/Chest: Effort normal and breath sounds normal. No stridor. No respiratory distress. He has no wheezes. He has no rales. He exhibits no tenderness.  Abdominal: Soft. Bowel sounds are normal. He exhibits no distension. There is no tenderness. There is no rebound and no guarding.  Musculoskeletal: Normal range of motion. He exhibits no edema and no tenderness.  Lymphadenopathy:    He has no cervical adenopathy.  Neurological: He is alert and oriented to person, place, and time. He has normal reflexes. He displays normal reflexes. No cranial nerve deficit. Coordination normal.  Skin: Skin is warm. He is not diaphoretic.          Assessment & Plan:

## 2012-01-30 NOTE — Assessment & Plan Note (Addendum)
Lab Results  Component Value Date   HGBA1C 7.6* 01/10/2012   CREATININE 0.78 01/10/2012   MICROALBUR 0.96 01/09/2012   MICRALBCREAT 8.2 01/09/2012   CHOL 171 01/09/2012   HDL 40 01/09/2012   TRIG 134 01/09/2012    Last eye exam and foot exam: Foot exam done   Assessment: Diabetes control: controlled Progress toward goals: not at goal Barriers to meeting goals: no barriers identified  Plan: Diabetes treatment: -Increase metformin from 500 twice a day to 1000 twice a day.                                 - Discontinue Actos as cost is an issue.                                 - check microalbumin/creatinine ratio.                                 - start him on low dose lisinopril  Refer to: diabetes educator for self-management training, diabetes educator for medical nutrition therapy and nutritionist Instruction/counseling given: reminded to bring blood glucose meter & log to each visit, reminded to bring medications to each visit, discussed foot care, discussed the need for weight loss and discussed diet

## 2012-01-30 NOTE — Assessment & Plan Note (Signed)
Start him on low dose lisinopril.

## 2012-01-31 LAB — MICROALBUMIN / CREATININE URINE RATIO: Microalb, Ur: 0.86 mg/dL (ref 0.00–1.89)

## 2012-04-14 ENCOUNTER — Encounter: Payer: Self-pay | Admitting: Internal Medicine

## 2012-04-25 ENCOUNTER — Emergency Department (HOSPITAL_COMMUNITY)
Admission: EM | Admit: 2012-04-25 | Discharge: 2012-04-25 | Disposition: A | Discharge status: Home / Self Care | Payer: Self-pay | Attending: Emergency Medicine | Admitting: Emergency Medicine

## 2012-04-25 ENCOUNTER — Encounter (HOSPITAL_COMMUNITY): Payer: Self-pay

## 2012-04-25 DIAGNOSIS — Z79899 Other long term (current) drug therapy: Secondary | ICD-10-CM | POA: Insufficient documentation

## 2012-04-25 DIAGNOSIS — M79609 Pain in unspecified limb: Secondary | ICD-10-CM | POA: Insufficient documentation

## 2012-04-25 DIAGNOSIS — G8929 Other chronic pain: Secondary | ICD-10-CM | POA: Insufficient documentation

## 2012-04-25 DIAGNOSIS — M545 Low back pain, unspecified: Secondary | ICD-10-CM | POA: Insufficient documentation

## 2012-04-25 DIAGNOSIS — M5416 Radiculopathy, lumbar region: Secondary | ICD-10-CM

## 2012-04-25 DIAGNOSIS — IMO0002 Reserved for concepts with insufficient information to code with codable children: Secondary | ICD-10-CM | POA: Insufficient documentation

## 2012-04-25 DIAGNOSIS — E119 Type 2 diabetes mellitus without complications: Secondary | ICD-10-CM | POA: Insufficient documentation

## 2012-04-25 MED ORDER — HYDROCODONE-ACETAMINOPHEN 5-325 MG PO TABS: 2.0000 | ORAL_TABLET | ORAL | Status: AC | PRN

## 2012-04-25 MED ORDER — HYDROCODONE-ACETAMINOPHEN 5-325 MG PO TABS
2.0000 | ORAL_TABLET | Freq: Once | ORAL | Status: AC
  Administered 2012-04-25: 2 via ORAL
  Filled 2012-04-25: qty 2

## 2012-04-25 NOTE — ED Provider Notes (Signed)
History     CSN: [id number]  Arrival date & time 04/25/12  4098   First MD Initiated Contact with Patient 04/25/12 1012      Chief Complaint  Patient presents with  . Back Pain  . Leg Pain    (Consider location/radiation/quality/duration/timing/severity/associated sxs/prior treatment) HPI Complains of low back pain radiating to both calves onset several months ago becoming worse over the past 4 days. Pain is worse with changing positions improved with remaining still. Pain is moderate sharp in quality. No treatment prior to coming here. No incontinence no fever no injury no other associated symptoms Past Medical History  Diagnosis Date  . Diabetes mellitus   . Neuromuscular disorder    chronic back pain Duodenitis History reviewed. No pertinent past surgical history.  No family history on file.  History  Substance Use Topics  . Smoking status: Current Some Day Smoker -- 0.1 packs/day    Types: Cigarettes    Last Attempt to Quit: 01/05/2012  . Smokeless tobacco: Never Used  . Alcohol Use: 3.0 oz/week    5 Cans of beer per week   Denies drug use   Review of Systems  Constitutional: Negative.   HENT: Negative.   Respiratory: Negative.   Cardiovascular: Negative.   Gastrointestinal: Negative.   Musculoskeletal: Positive for back pain.  Skin: Negative.   Neurological: Negative.   Hematological: Negative.   Psychiatric/Behavioral: Negative.   All other systems reviewed and are negative.    Allergies  Review of patient's allergies indicates no known allergies.  Home Medications   Current Outpatient Rx  Name Route Sig Dispense Refill  . LISINOPRIL 5 MG PO TABS Oral Take 1 tablet (5 mg total) by mouth daily. 30 tablet 3  . METFORMIN HCL 1000 MG PO TABS  Take 1 tablet by mouth twice daily. 60 tablet 3  . OMEPRAZOLE 20 MG PO CPDR Oral Take 1 capsule (20 mg total) by mouth daily. 60 capsule 2    BP 131/77  Pulse 86  Temp(Src) 98.1 F (36.7 C) (Oral)  Resp 18   SpO2 98%  Physical Exam  Constitutional: He appears well-developed and well-nourished. He appears distressed.       Mildly uncomfortable  HENT:  Head: Normocephalic and atraumatic.  Eyes: Conjunctivae are normal. Pupils are equal, round, and reactive to light.  Neck: Neck supple. No tracheal deviation present. No thyromegaly present.  Cardiovascular: Normal rate and regular rhythm.   No murmur heard. Pulmonary/Chest: Effort normal and breath sounds normal.  Abdominal: Soft. Bowel sounds are normal. He exhibits no distension. There is no tenderness.  Musculoskeletal: Normal range of motion. He exhibits no edema and no tenderness.       Mildly tender over lumbar area  Neurological: He is alert. He has normal reflexes. Coordination normal.       Gait normal, DTRs symmetric bilaterally at knee jerk ankle jerk and biceps toes downward going bilaterally  Skin: Skin is warm and dry. No rash noted.  Psychiatric: He has a normal mood and affect.    ED Course  Procedures (including critical care time)  Labs Reviewed - No data to display No results found.   No diagnosis found.    MDM  Plan prescription Norco Referral [facility] Followup [facility] if not improved 2 or 3 days. Diagnosis lumbar radiculopathy         Doug Sou, MD 04/25/12 1023

## 2012-04-25 NOTE — Discharge Instructions (Signed)
Lumbosacral Radiculopathy  Take Tylenol for mild pain or the pain medicine prescribed for bad pain. Go to the [facility] if having significant pain in 2 or 3 days. Call [facility] today to get a primary care doctor Lumbosacral radiculopathy is a pinched nerve or nerves in the low back (lumbosacral area). When this happens you may have weakness in your legs and may not be able to stand on your toes. You may have pain going down into your legs. There may be difficulties with walking normally. There are many causes of this problem. Sometimes this may happen from an injury, or simply from arthritis or boney problems. It may also be caused by other illnesses such as diabetes. If there is no improvement after treatment, further studies may be done to find the exact cause. DIAGNOSIS  X-rays may be needed if the problems become long standing. Electromyograms may be done. This study is one in which the working of nerves and muscles is studied. HOME CARE INSTRUCTIONS   Applications of ice packs may be helpful. Ice can be used in a plastic bag with a towel around it to prevent frostbite to skin. This may be used every 2 hours for 20 to 30 minutes, or as needed, while awake, or as directed by your caregiver.   Only take over-the-counter or prescription medicines for pain, discomfort, or fever as directed by your caregiver.   If physical therapy was prescribed, follow your caregiver's directions.  SEEK IMMEDIATE MEDICAL CARE IF:   You have pain not controlled with medications.   You seem to be getting worse rather than better.   You develop increasing weakness in your legs.   You develop loss of bowel or bladder control.   You have difficulty with walking or balance, or develop clumsiness in the use of your legs.   You have a fever.  MAKE SURE YOU:   Understand these instructions.   Will watch your condition.   Will get help right away if you are not  doing well or get worse.  Document Released: 12/03/2005 Document Revised: 11/22/2011 Document Reviewed: 07/23/2008 [facility] Patient Information 2012 [place], [place].

## 2012-04-25 NOTE — ED Notes (Signed)
Patient presents with right leg pain and bilateral lower back pain x several days.  Patient has been seen in past for same.  Patient denies urinary symptoms and is requesting pain medication.

## 2012-04-25 NOTE — ED Notes (Signed)
Patient ambulatory in department with mild limp.  Patient reports he has an appointment scheduled to see his PCP on Monday.  Patient requesting pain medication for his right leg and back.  Does not want to provide a urine sample because patient states "i know it's not my kidneys"

## 2012-04-28 ENCOUNTER — Encounter: Payer: Self-pay | Admitting: Internal Medicine

## 2012-04-28 ENCOUNTER — Ambulatory Visit (INDEPENDENT_AMBULATORY_CARE_PROVIDER_SITE_OTHER): Payer: Self-pay | Admitting: Internal Medicine

## 2012-04-28 DIAGNOSIS — B1920 Unspecified viral hepatitis C without hepatic coma: Secondary | ICD-10-CM

## 2012-04-28 DIAGNOSIS — Z79899 Other long term (current) drug therapy: Secondary | ICD-10-CM

## 2012-04-28 DIAGNOSIS — I10 Essential (primary) hypertension: Secondary | ICD-10-CM

## 2012-04-28 DIAGNOSIS — Z139 Encounter for screening, unspecified: Secondary | ICD-10-CM

## 2012-04-28 DIAGNOSIS — E1140 Type 2 diabetes mellitus with diabetic neuropathy, unspecified: Secondary | ICD-10-CM

## 2012-04-28 DIAGNOSIS — E1149 Type 2 diabetes mellitus with other diabetic neurological complication: Secondary | ICD-10-CM

## 2012-04-28 DIAGNOSIS — E119 Type 2 diabetes mellitus without complications: Secondary | ICD-10-CM

## 2012-04-28 DIAGNOSIS — M549 Dorsalgia, unspecified: Secondary | ICD-10-CM

## 2012-04-28 DIAGNOSIS — E1142 Type 2 diabetes mellitus with diabetic polyneuropathy: Secondary | ICD-10-CM

## 2012-04-28 LAB — LIPID PANEL
LDL Cholesterol: 77 mg/dL (ref 0–99)
VLDL: 67 mg/dL — ABNORMAL HIGH (ref 0–40)

## 2012-04-28 LAB — GLUCOSE, CAPILLARY: Glucose-Capillary: 215 mg/dL — ABNORMAL HIGH (ref 70–99)

## 2012-04-28 LAB — POCT GLYCOSYLATED HEMOGLOBIN (HGB A1C): Hemoglobin A1C: 7.1

## 2012-04-28 NOTE — Patient Instructions (Addendum)
Please schedule a follow up appointment in 1 month . Please bring your medication bottles with your next appointment. Please take your medicines as prescribed. I will call you with your lab results if anything will be abnormal. 

## 2012-04-28 NOTE — Progress Notes (Signed)
  Subjective:    [name], male    DOB: 1961-07-13, 51 y.o.   MRN: [id number]  HPI: 51 year old man with past medical history significant for diabetes, hypertension comes to the clinic for followup visit.  Patient was an hour late for his clinic appointment and he was explained that it would be a short acute visit today.  The patient was seen in the ER on 04/25/2012 about 3 days prior to this clinic for back pain and was advised to schedule followup with [place]. He describes his back pain as dull aching discomfort, located in the lower back.His pain radiates from his lower legs up to the back. Pain is mostly located on the posterior aspect of his legs.Rates his  pain 7/10 today. He was given a prescription of Vicodin at the ER discharge and states that the medicine helps but the effect lasts just  for 4 hours so he doesn't like the medication. He also reports some tingling and numbness associated with it.Denies any inciting event like trauma or twisting his back or lifting any heavy weights.  Patient was started on Neurontin for diabetic neuropathy with his last clinic visit but he did not refill his meds until about 3 days ago.    Review of Systems  HENT: Negative for nosebleeds, congestion, rhinorrhea and postnasal drip.   Eyes: Negative for visual disturbance.  Respiratory: Negative for shortness of breath, wheezing and stridor.   Cardiovascular: Negative for chest pain, palpitations and leg swelling.  Genitourinary: Negative for frequency and flank pain.  Musculoskeletal: Positive for back pain.  Neurological: Positive for headaches. Negative for seizures and speech difficulty.  Hematological: Negative for adenopathy.  Psychiatric/Behavioral: Negative for agitation.       Objective:   Physical Exam  Constitutional: He is oriented to person, place, and time. He appears well-developed and well-nourished. No distress.  HENT:  Head: Normocephalic and atraumatic.  Mouth/Throat:  No oropharyngeal exudate.  Eyes: Conjunctivae and EOM are normal. Pupils are equal, round, and reactive to light.  Neck: Normal range of motion. Neck supple. No JVD present. No tracheal deviation present. No thyromegaly present.  Cardiovascular: Normal rate, regular rhythm, normal heart sounds and intact distal pulses.  Exam reveals no gallop and no friction rub.   No murmur heard. Pulmonary/Chest: Effort normal and breath sounds normal. No stridor. No respiratory distress. He has no wheezes. He has no rales.  Abdominal: Soft. Bowel sounds are normal. He exhibits no distension. There is no tenderness. There is no rebound.  Musculoskeletal:       SLRT negative.  Lymphadenopathy:    He has no cervical adenopathy.  Neurological: He is alert and oriented to person, place, and time. He has normal reflexes. He displays normal reflexes. No cranial nerve deficit. Coordination normal.  Skin: Skin is warm. He is not diaphoretic.          Assessment & Plan:

## 2012-04-30 DIAGNOSIS — E1142 Type 2 diabetes mellitus with diabetic polyneuropathy: Secondary | ICD-10-CM | POA: Insufficient documentation

## 2012-04-30 NOTE — Assessment & Plan Note (Signed)
Continues to complain of pain in his legs. He just started taking Neurontin about 3 days ago. -Patient was advised to continue taking the medication and was educated that it might take a few weeks for medication to start taking its effect.

## 2012-04-30 NOTE — Assessment & Plan Note (Signed)
His back pain is most likely normal muscular strain. -Patient was advised not to lift any heavy weights and avoid twisting the straining of his back. -He was advised to take anti-inflammatory medications like ibuprofen. -Use hot/cold compresses that would also help with the symptoms

## 2012-04-30 NOTE — Assessment & Plan Note (Signed)
Lab Results  Component Value Date   NA 137 01/10/2012   K 4.0 01/10/2012   CL 101 01/10/2012   CO2 27 01/10/2012   BUN 8 01/10/2012   CREATININE 0.78 01/10/2012    BP Readings from Last 3 Encounters:  04/28/12 119/78  04/25/12 131/77  01/30/12 128/87    Assessment: Hypertension control:  controlled  Progress toward goals:  at goal Barriers to meeting goals:  no barriers identified  Plan: Hypertension treatment:  continue current medications

## 2012-04-30 NOTE — Assessment & Plan Note (Signed)
Lab Results  Component Value Date   HGBA1C 7.1 04/28/2012   HGBA1C 7.6* 01/10/2012   CREATININE 0.78 01/10/2012   MICROALBUR 0.86 01/30/2012   MICRALBCREAT 4.5 01/30/2012   CHOL 184 04/28/2012   HDL 40 04/28/2012   TRIG 335* 04/28/2012    Last eye exam and foot exam: No results found for this basename: HMDIABEYEEXA, HMDIABFOOTEX    Assessment: Diabetes control: controlled Progress toward goals: at goal Barriers to meeting goals: no barriers identified  Plan: Diabetes treatment: continue current medications Refer to: none Instruction/counseling given: reminded to bring blood glucose meter & log to each visit, reminded to bring medications to each visit, discussed foot care, discussed the need for weight loss and discussed diet

## 2012-04-30 NOTE — Assessment & Plan Note (Signed)
Patient requested to be screened for hepatitis with the last visit and he was found to be hepatitis C reactive. -Would check hepatitis C RNA by PCR to quantify his viral load with today's visit.

## 2012-05-01 LAB — HEPATITIS C RNA QUANTITATIVE
HCV Quantitative Log: 6.22 {Log} — ABNORMAL HIGH (ref <1.63)
HCV Quantitative: 1651833 IU/mL — ABNORMAL HIGH (ref <43)

## 2012-05-08 ENCOUNTER — Telehealth: Payer: Self-pay | Admitting: *Deleted

## 2012-05-08 DIAGNOSIS — G629 Polyneuropathy, unspecified: Secondary | ICD-10-CM

## 2012-05-08 NOTE — Telephone Encounter (Signed)
Pt stopped by office stating neurontin is not helping him.  He is taking one in AM and one in PM.  I can't find it listed in him med list??? He states vicodin 5/325 work much better. He is having low back pain with radiation to feet.   He has scheduled appointment 6/17.  Pt # [phone]

## 2012-05-13 ENCOUNTER — Ambulatory Visit (INDEPENDENT_AMBULATORY_CARE_PROVIDER_SITE_OTHER): Payer: Self-pay | Admitting: Internal Medicine

## 2012-05-13 ENCOUNTER — Ambulatory Visit (HOSPITAL_COMMUNITY)
Admission: RE | Admit: 2012-05-13 | Discharge: 2012-05-13 | Disposition: A | Discharge status: Home / Self Care | Payer: Self-pay | Source: Ambulatory Visit | Attending: Internal Medicine | Admitting: Internal Medicine

## 2012-05-13 ENCOUNTER — Encounter: Payer: Self-pay | Admitting: Internal Medicine

## 2012-05-13 VITALS — BP 104/71 | HR 78 | Temp 97.8°F | Ht 69.0 in | Wt 177.0 lb

## 2012-05-13 DIAGNOSIS — B1920 Unspecified viral hepatitis C without hepatic coma: Secondary | ICD-10-CM

## 2012-05-13 DIAGNOSIS — IMO0002 Reserved for concepts with insufficient information to code with codable children: Secondary | ICD-10-CM

## 2012-05-13 DIAGNOSIS — I700 Atherosclerosis of aorta: Secondary | ICD-10-CM | POA: Insufficient documentation

## 2012-05-13 DIAGNOSIS — M545 Low back pain, unspecified: Secondary | ICD-10-CM | POA: Insufficient documentation

## 2012-05-13 DIAGNOSIS — M549 Dorsalgia, unspecified: Secondary | ICD-10-CM

## 2012-05-13 DIAGNOSIS — Z8719 Personal history of other diseases of the digestive system: Secondary | ICD-10-CM

## 2012-05-13 DIAGNOSIS — M5410 Radiculopathy, site unspecified: Secondary | ICD-10-CM

## 2012-05-13 DIAGNOSIS — Z23 Encounter for immunization: Secondary | ICD-10-CM

## 2012-05-13 HISTORY — DX: Personal history of other diseases of the digestive system: Z87.19

## 2012-05-13 LAB — GLUCOSE, CAPILLARY: Glucose-Capillary: 154 mg/dL — ABNORMAL HIGH (ref 70–99)

## 2012-05-13 LAB — COMPREHENSIVE METABOLIC PANEL
ALT: 31 U/L (ref 0–53)
AST: 31 U/L (ref 0–37)
BUN: 16 mg/dL (ref 6–23)
Calcium: 9.6 mg/dL (ref 8.4–10.5)
Creat: 0.76 mg/dL (ref 0.50–1.35)
Total Bilirubin: 0.6 mg/dL (ref 0.3–1.2)

## 2012-05-13 LAB — TSH: TSH: 0.655 u[IU]/mL (ref 0.350–4.500)

## 2012-05-13 MED ORDER — MELOXICAM 7.5 MG PO TABS: ORAL_TABLET | ORAL | Status: DC

## 2012-05-13 MED ORDER — GABAPENTIN 300 MG PO CAPS: 300.0000 mg | ORAL_CAPSULE | Freq: Two times a day (BID) | ORAL | Status: DC

## 2012-05-13 MED ORDER — CYCLOBENZAPRINE HCL 5 MG PO TABS: 5.0000 mg | ORAL_TABLET | Freq: Three times a day (TID) | ORAL | Status: AC | PRN

## 2012-05-13 NOTE — Telephone Encounter (Signed)
Pt returned to clinic today.  Has not heard from Dr Dorthula Rue.  He is using IBU with some relief. Will schedule him for earlier visit.

## 2012-05-13 NOTE — Patient Instructions (Signed)
Keep your followup appointment scheduled in June. Mobic is a new medicine to help with pain. Use as directed. Do not take any ibuprofen, Aleve, Motrin, Naprosyn, naproxen or other NSAID medications well taking Mobic. Be sure you take with food. You may take over-the-counter Tylenol as needed for additional pain relief. Flexeril as a muscle relaxer to help with muscle spasm. Start by taking one pill at night before bed. This medication may make you sleepy. If possible try to take 3 times a day. You may increase to 2 tablets at night before bed if needed I will contact you if any of your blood work is abnormal.

## 2012-05-13 NOTE — Telephone Encounter (Signed)
I tried calling the patient but could not reach him earlier. I tried following up today but looks like he is down there for an appointment.  He was on neurontin when he followed up with me last time- he did not refill his meds until 3 days prior to clinic  appointment. It seems like that medicine was discontinued by some other provider - Maryruth Hancock Allred fro cost . But the patient had my script and had medication bottles when he followed up with me. I would renew the medication in his med list.

## 2012-05-13 NOTE — Progress Notes (Signed)
[name], male   DOB: August 08, 1961, 51 y.o.   MRN: [id number] Subjective:     HPI: Patient is a 51 year old gentleman here today for acute complaint of low back pain.  He described LBP that radiates down the back of both legs that wakes him from sleep.  He is taking ibuprofen with some relief of his symptoms.  He has these symptoms since 2007 when he was diagnosed with DM.  He states the pain started in his feet and lower legs and has slowly progressed up his back.  He reports tingling/numbess/pins and needles sensation in both feet.  He describes the pain radiating down the back of his legs as aching/numbness.  He notes the symptoms become worse with activity or prolonged standing.  He denies fevers or chills.  He denies bowel or bladder incontinence; denies bowel/bladder retention.  He has been taking neurontin bid since his last OV with no change in his symptoms.     Review of Systems Constitutional: Negative for fever, chills, diaphoresis, activity change, appetite change, fatigue and unexpected weight change.  HENT: Negative for hearing loss, congestion and neck stiffness.   Eyes: Negative for photophobia, pain and visual disturbance.  Respiratory: Negative for cough, chest tightness, shortness of breath and wheezing.   Cardiovascular: Negative for chest pain and palpitations.  Gastrointestinal: Negative for abdominal pain, blood in stool and anal bleeding.  Genitourinary: Negative for dysuria, hematuria and difficulty urinating.  Musculoskeletal: Negative for joint swelling.  Neurological: Negative for dizziness, syncope, speech difficulty, weakness, numbness and headaches.      Objective:   Physical Exam VItal signs reviewed  GEN: No apparent distress.  Alert and oriented x 3.  Pleasant, conversant, and cooperative to exam. HEENT: head is autraumatic and normocephalic.  Neck is supple without palpable masses or lymphadenopathy.  No JVD or carotid bruits.  Vision intact.   EOMI.  PERRLA.  Sclerae anicteric.  Conjunctivae without pallor or injection. Mucous membranes are moist.  Oropharynx is without erythema, exudates, or other abnormal lesions.   RESP:  Lungs are clear to ascultation bilaterally with good air movement.  No wheezes, ronchi, or rubs. CARDIOVASCULAR: regular rate, normal rhythm.  Clear S1, S2, no murmurs, gallops, or rubs. ABDOMEN: soft, non-tender, non-distended.  Bowels sounds present in all quadrants and normoactive.  No palpable masses. EXT: warm and dry.  Peripheral pulses equal, intact, and +2 globally.  No clubbing or cyanosis.  Trace edema in bilateral lower extremities. SKIN: warm and dry with normal turgor.  No rashes or abnormal lesions observed. Mental Status: Alert, oriented, thought content appropriate.  Speech fluent without evidence of aphasia.  Able to follow 3 step commands without difficulty. Cranial Nerves: II: visual fields grossly normal, pupils equal, round, reactive to light and accommodation III,IV, VI: ptosis not present, extraocular muscles extra-ocular motions intact bilaterally V,VII: smile symmetric, facial light touch sensation normal bilaterally VIII: hearing normal bilaterally IX,X: gag reflex present, uvula midline XI: trapezius strength/neck flexion strength normal bilaterally XII: tongue strength normal  Motor: Right : Upper extremity    Left:     Upper extremity 5/5 deltoid       5/5 deltoid 5/5 tricep      5/5 tricep 5/5 biceps      5/5 biceps  5/5 hand grip      5/5 hand grip  Lower extremity     Lower extremity 5/5 hip flexor      5/5 hip flexor 5/5 hip adductors  5/5 hip adductors 5/5 hip abductors     5/5 hip abductors 5/5 quadricep      5/5 quadriceps  5/5 plantar flexion       5/5 plantar flexion 5/5 plantar extension     5/5 plantar extension Tone and bulk:normal tone throughout; no atrophy noted Sensory: light touch intact throughout, bilaterally Deep Tendon Reflexes: 2+ and symmetric  throughout       Assessment/Plan:

## 2012-05-14 LAB — CBC
HCT: 43.8 % (ref 39.0–52.0)
Hemoglobin: 14.7 g/dL (ref 13.0–17.0)
MCHC: 33.6 g/dL (ref 30.0–36.0)
MCV: 71.2 fL — ABNORMAL LOW (ref 78.0–100.0)
RDW: 15.6 % — ABNORMAL HIGH (ref 11.5–15.5)
WBC: 6.8 10*3/uL (ref 4.0–10.5)

## 2012-05-14 NOTE — Assessment & Plan Note (Signed)
Patient does not have any signs or symptoms concerning for compression, infection, or other concerning process. Will proceed with workup as well aligned under her problem entitled radiculopathy. Will prescribe Mobic for relief of pain and also Flexeril for relief of muscle tension/spasm.

## 2012-05-14 NOTE — Assessment & Plan Note (Signed)
Patient describes symptoms suggestive of bilateral sciatic nerve pain.  I am unsure if his sciatic pain is the result of nerve compression from herniated disc (this seems unlikely as SLR negative) or from muscle tension/spasm (piriformis).  Will complete workup for neuropathy including: B12 level, repeat HIV Ab, TSH, CBC, and metabolic panel.  Will obtain plain film xray to assess for bony lesion/abnormality; if this work up is unrevealing, would consider MRI to assess for herniated disc.  He does not have any "red flags" to suggest epidural abscess, cauda equina, or other infections/concerning process.

## 2012-05-14 NOTE — Assessment & Plan Note (Signed)
Patient has active HCV infection as evidenced by viral load testing. His hepatitis panel indicates he has not been vaccinated for hepatitis B or hepatitis A. Will administer her first dose of each vaccine today.  Informed patient he will need to return for her second hepatitis B vaccine as well as second and third hepatitis B vaccinations. Will also refer him to the hepatitis C clinic to be evaluated for potential treatment.

## 2012-05-26 ENCOUNTER — Encounter: Payer: Self-pay | Admitting: *Deleted

## 2012-05-26 ENCOUNTER — Encounter: Payer: Self-pay | Admitting: Internal Medicine

## 2012-05-26 ENCOUNTER — Ambulatory Visit (INDEPENDENT_AMBULATORY_CARE_PROVIDER_SITE_OTHER): Payer: Self-pay | Admitting: Internal Medicine

## 2012-05-26 VITALS — BP 105/75 | HR 82 | Temp 97.2°F | Ht 69.0 in | Wt 181.8 lb

## 2012-05-26 DIAGNOSIS — E119 Type 2 diabetes mellitus without complications: Secondary | ICD-10-CM

## 2012-05-26 DIAGNOSIS — M549 Dorsalgia, unspecified: Secondary | ICD-10-CM

## 2012-05-26 DIAGNOSIS — R718 Other abnormality of red blood cells: Secondary | ICD-10-CM

## 2012-05-26 NOTE — Assessment & Plan Note (Signed)
Patient has microcytosis seen on recent clinic visit with Dr. Arvilla Market. His hemoglobin is 14.7 indicating that he does not have anemia. If microcytosis was secondary to iron deficiency anemia, I would expect to see low hemoglobin levels. He has no signs or symptoms of GI bleed. As he is 50 he is appropriate to refer for routine colon cancer screening. We will await him receiving the orange card before referral is placed. Will also recommend ferritin check once he gets the orange card. If ferritin is normal then microcytosis is likely secondary to thalassemia or potentially sickle cell anemia. If ferritin is very low my concern would increase for iron deficiency anemia and would recommend colonoscopy not be delayed or stool cards be given to the patient.

## 2012-05-26 NOTE — Assessment & Plan Note (Addendum)
Patient does not have any concerning symptoms of back pain such as fever, weight loss, fatigue, energy change, muscle weakness, sensory deficit, or saddle anesthesia. All of the results that Dr. Arvilla Market ordered at her last visit (CBC, B12, TSH, HIV, plain films of the back) were normal. These were reviewed with the patient today. The patient still does not have the orange card and MRI is cost prohibitive. Furthermore at this point he does not have concerning findings that warrant MRI. He has also not started meloxicam. The NSAID benefits on inflammation will likely be the most significant for this patient. Encouraged patient to take Mobic today. If he gets the orange card we could suggest physical therapy. Also suggested ice and exercise overall to help strengthen the muscles surrounding his back and decrease reinjury. Patient will followup with his PCP in 1 week if symptoms are not improved. Otherwise he can followup as previously scheduled for his regular diabetes care.

## 2012-05-26 NOTE — Patient Instructions (Signed)
Please take cyclobenzaprine 10 mg at night before bed to see if this helps to sleep. It is very important that he take meloxicam daily. I think this medication will be the most helpful in terms of treating your pain. Keep your wallet in your front pocket. Please rest for the next day. If you are feeling able, trying to get back into her normal routine as soon as possible. People who remain motionless for long periods of time have worsening of their back pain. If the orange card comes through, we can refer you to physical therapy.   Return to clinic to see Dr. Dorthula Rue in 1 month if not improved.  Please bring all your medications to your next clinic appointment.    Sciatica Sciatica is a weakness and/or changes in sensation (tingling, jolts, hot and cold, numbness) along the path the sciatic nerve travels. Irritation or damage to lumbar nerve roots is often also referred to as lumbar radiculopathy.  Lumbar radiculopathy (Sciatica) is the most common form of this problem. Radiculopathy can occur in any of the nerves coming out of the spinal cord. The problems caused depend on which nerves are involved. The sciatic nerve is the large nerve supplying the branches of nerves going from the hip to the toes. It often causes a numbness or weakness in the skin and/or muscles that the sciatic nerve serves. It also may cause symptoms (problems) of pain, burning, tingling, or electric shock-like feelings in the path of this nerve. This usually comes from injury to the fibers that make up the sciatic nerve. Some of these symptoms are low back pain and/or unpleasant feelings in the following areas:  From the mid-buttock down the back of the leg to the back of the knee.   And/or the outside of the calf and top of the foot.   And/or behind the inner ankle to the sole of the foot.  CAUSES   Herniated or slipped disc. Discs are the little cushions between the bones in the back.   Pressure by the piriformis muscle in  the buttock on the sciatic nerve (Piriformis Syndrome).   Misalignment of the bones in the lower back and buttocks (Sacroiliac Joint Derangement).   Narrowing of the spinal canal that puts pressure on or pinches the fibers that make up the sciatic nerve.   A slipped vertebra that is out of line with those above or beneath it.   Abnormality of the nervous system itself so that nerve fibers do not transmit signals properly, especially to feet and calves (neuropathy).   Tumor (this is rare).  Your caregiver can usually determine the cause of your sciatica and begin the treatment most likely to help you. TREATMENT  Taking over-the-counter painkillers, physical therapy, rest, exercise, spinal manipulation, and injections of anesthetics and/or steroids may be used. Surgery, acupuncture, and Yoga can also be effective. Mind over matter techniques, mental imagery, and changing factors such as your bed, chair, desk height, posture, and activities are other treatments that may be helpful. You and your caregiver can help determine what is best for you. With proper diagnosis, the cause of most sciatica can be identified and removed. Communication and cooperation between your caregiver and you is essential. If you are not successful immediately, do not be discouraged. With time, a proper treatment can be found that will make you comfortable. HOME CARE INSTRUCTIONS   If the pain is coming from a problem in the back, applying ice to that area for 15 to 20  minutes, 3 to 4 times per day while awake, may be helpful. Put the ice in a plastic bag. Place a towel between the bag of ice and your skin.   You may exercise or perform your usual activities if these do not aggravate your pain, or as suggested by your caregiver.   Only take over-the-counter or prescription medicines for pain, discomfort, or fever as directed by your caregiver.   If your caregiver has given you a follow-up appointment, it is very important  to keep that appointment. Not keeping the appointment could result in a chronic or permanent injury, pain, and disability. If there is any problem keeping the appointment, you must call back to this facility for assistance.  SEEK IMMEDIATE MEDICAL CARE IF:   You experience loss of control of bowel or bladder.   You have increasing weakness in the trunk, buttocks, or legs.   There is numbness in any areas from the hip down to the toes.   You have difficulty walking or keeping your balance.   You have any of the above, with fever or forceful vomiting.  Document Released: 11/27/2001 Document Revised: 11/22/2011 Document Reviewed: 07/16/2008 [facility] Patient Information 2012 [place], [place].

## 2012-05-26 NOTE — Progress Notes (Signed)
Pt walked into clinic with c/o pain to rt hip and leg. Rates pain 10/10 , constant. He has been seen for this before and given muscle relaxants  with out relief.  Also takes BC powders. Will see today.

## 2012-05-26 NOTE — Progress Notes (Signed)
Subjective:     [name], male   DOB: [birth year], 51 y.o.   MRN: [id number]  Back Pain This is a chronic (Patient states he is having a flare of his chronic back pain) problem. The current episode started 1 to 4 weeks ago (He states he saw Dr. Arvilla Market a little while ago, and she gave him 2 medications. He states he is not feeling any better today. Denies any trauma or recent injury). The problem occurs daily. The problem has been gradually worsening since onset. The pain is present in the gluteal and lumbar spine. The quality of the pain is described as aching and cramping. The pain radiates to the right foot (Pain radiates down his posterior right leg into the bottom of his right foot. He states he has some tingling and needles sensation on the bottom of his foot). The pain is at a severity of 10/10. The pain is severe. The pain is worse during the night. The symptoms are aggravated by bending and lying down (Patient has difficulty identifying factors that definitively aggravate his pain. He states bending down will make it worse but cannot describe how). Associated symptoms include leg pain and paresthesias. Pertinent negatives include no bladder incontinence, dysuria, fever, numbness, paresis, pelvic pain, perianal numbness, weakness or weight loss. Risk factors include lack of exercise. He has tried analgesics, heat and muscle relaxant (He states the gabapentin and Flexeril have not helped his pain at all. In general the patient has not tried many nonpharmacologic remedies. He states the pain is somewhat improved with putting his feet elevated on a pillow when he sleeps.) for the symptoms. The treatment provided mild relief.   states he only filled the prescription for cyclobenzaprine. He did not fill the prescription for meloxicam.   Review of Systems  Constitutional: Negative for fever, chills, weight loss, diaphoresis, activity change, appetite change and fatigue.  Genitourinary: Negative  for bladder incontinence, dysuria, difficulty urinating and pelvic pain.  Musculoskeletal: Positive for back pain.  Neurological: Positive for paresthesias. Negative for weakness and numbness.       Objective:   Physical Exam  Constitutional: He appears well-developed and well-nourished. He appears distressed.  Cardiovascular: Normal rate, regular rhythm and normal heart sounds.   Pulmonary/Chest: Effort normal and breath sounds normal.  Musculoskeletal: Normal range of motion. He exhibits tenderness. He exhibits no edema.       Lumbar back: He exhibits tenderness and pain. He exhibits no bony tenderness, no swelling, no edema and no spasm.       Straight leg test is equivocally positive on the right. It is negative on the left.   Neurological: He is alert. He has normal strength and normal reflexes. No cranial nerve deficit or sensory deficit. Gait normal.  Reflex Scores:      Patellar reflexes are 2+ on the right side and 2+ on the left side.      Achilles reflexes are 2+ on the right side and 2+ on the left side. Skin: Skin is dry. No rash noted. He is not diaphoretic.  Psychiatric: He has a normal mood and affect. His behavior is normal.       Assessment:     Case discussed with Dr. Josem Kaufmann. Please see problem oriented charting for assessment and plan by problem (best viewed under encounters tab).

## 2012-05-26 NOTE — Assessment & Plan Note (Signed)
Patient has tried to be good with diet. Recommended avoidance of concentrated sweets.

## 2012-06-02 ENCOUNTER — Ambulatory Visit (INDEPENDENT_AMBULATORY_CARE_PROVIDER_SITE_OTHER): Payer: Self-pay | Admitting: Internal Medicine

## 2012-06-02 ENCOUNTER — Encounter: Payer: Self-pay | Admitting: Internal Medicine

## 2012-06-02 VITALS — BP 100/75 | HR 80 | Temp 97.8°F | Ht 69.0 in | Wt 182.5 lb

## 2012-06-02 DIAGNOSIS — K219 Gastro-esophageal reflux disease without esophagitis: Secondary | ICD-10-CM

## 2012-06-02 DIAGNOSIS — I10 Essential (primary) hypertension: Secondary | ICD-10-CM

## 2012-06-02 DIAGNOSIS — E1142 Type 2 diabetes mellitus with diabetic polyneuropathy: Secondary | ICD-10-CM

## 2012-06-02 DIAGNOSIS — M549 Dorsalgia, unspecified: Secondary | ICD-10-CM

## 2012-06-02 DIAGNOSIS — E119 Type 2 diabetes mellitus without complications: Secondary | ICD-10-CM

## 2012-06-02 DIAGNOSIS — G629 Polyneuropathy, unspecified: Secondary | ICD-10-CM

## 2012-06-02 DIAGNOSIS — E1140 Type 2 diabetes mellitus with diabetic neuropathy, unspecified: Secondary | ICD-10-CM

## 2012-06-02 DIAGNOSIS — E1149 Type 2 diabetes mellitus with other diabetic neurological complication: Secondary | ICD-10-CM

## 2012-06-02 MED ORDER — OMEPRAZOLE 20 MG PO CPDR: 20.0000 mg | DELAYED_RELEASE_CAPSULE | Freq: Every day | ORAL | Status: DC

## 2012-06-02 NOTE — Patient Instructions (Signed)
Please schedule a follow up appointment in 3 months. Please bring your medication bottles with your next appointment. Please take your medicines as prescribed.  

## 2012-06-02 NOTE — Progress Notes (Signed)
  Subjective:    [name], male    DOB: July 23, 1961, 51 y.o.   MRN: [id number]  HPI 51 year old man with past medical history significant for diabetes, hypertension, chronic low back pain comes to the clinic for followup visit.  He continues to complain of low back pain and pain in his bilateral legs. He described LBP that radiates down the back of both legs that wakes him from sleep.  He states the pain started in his feet and lower legs and has slowly progressed up his back. He reports tingling/numbess/pins and needles sensation in both feet. He describes the pain radiating down the back of his legs as aching/numbness. He notes the symptoms become worse with activity or prolonged standing. He denies fevers or chills. He denies bowel or bladder incontinence or retention.   He was also seen for the similar problem in our clinic 2 times within the last 3 weeks. He was recommended to start physical therapy on his last followup but that could not be done until he completes his paperwork for orange card. He has been taking Mobic and Neurontin but that doesn't seem to help him a lot.     Review of Systems  Constitutional: Negative for fever.  HENT: Negative for nosebleeds, congestion, sore throat, rhinorrhea, sneezing and postnasal drip.   Eyes: Negative for visual disturbance.  Respiratory: Negative for choking, wheezing and stridor.   Cardiovascular: Negative for chest pain, palpitations and leg swelling.  Musculoskeletal: Positive for back pain.  Neurological: Negative for weakness, light-headedness and numbness.  Hematological: Negative for adenopathy.  Psychiatric/Behavioral: Negative for agitation.       Objective:   Physical Exam  Constitutional: He is oriented to person, place, and time. He appears well-developed and well-nourished. No distress.  HENT:  Head: Normocephalic and atraumatic.  Mouth/Throat: No oropharyngeal exudate.  Eyes: Conjunctivae and EOM are normal.  Pupils are equal, round, and reactive to light.  Neck: Normal range of motion. Neck supple. No JVD present. No tracheal deviation present. No thyromegaly present.  Cardiovascular: Normal rate and regular rhythm.  Exam reveals no gallop and no friction rub.   No murmur heard. Pulmonary/Chest: Effort normal and breath sounds normal. No stridor. No respiratory distress. He has no wheezes. He has no rales.  Abdominal: Soft. Bowel sounds are normal. He exhibits no distension. There is no tenderness. There is no rebound.  Musculoskeletal: Normal range of motion. He exhibits no edema and no tenderness.  Neurological: He is alert and oriented to person, place, and time. He has normal reflexes. No cranial nerve deficit. Coordination normal.  Skin: He is not diaphoretic.          Assessment & Plan:

## 2012-06-04 MED ORDER — GABAPENTIN 300 MG PO CAPS: 600.0000 mg | ORAL_CAPSULE | Freq: Two times a day (BID) | ORAL | Status: DC

## 2012-06-04 NOTE — Assessment & Plan Note (Signed)
He continues to complain of low back pain at today's visit. It's unclear if it's related to his radiculopathy. He had imaging of his back that was essentially normal. There are no red flags that would warrant an emergent  MRI at this point .Once he qualifies for orange card -would first refer him for physical therapy. But if his back pain would persists despite all these conservative measures with get an MRI of his back.

## 2012-06-04 NOTE — Assessment & Plan Note (Signed)
He continues to complain of bilateral leg pain that started from the feet and is radiating up to his back associated with some tingling and numbness that has been going for several years but is more worse recently . He had workup including vitamin B12 levels, TSH and all the results were within normal limits Would increase her his dose of Neurontin from 300 mg twice a day to 600 mg twice a day to see if that helps with his symptoms .

## 2012-06-04 NOTE — Assessment & Plan Note (Signed)
Lab Results  Component Value Date   NA 136 05/13/2012   K 4.2 05/13/2012   CL 103 05/13/2012   CO2 25 05/13/2012   BUN 16 05/13/2012   CREATININE 0.76 05/13/2012   CREATININE 0.78 01/10/2012    BP Readings from Last 3 Encounters:  06/02/12 100/75  05/26/12 105/75  05/13/12 104/71    Assessment: Hypertension control:  controlled  Progress toward goals:  at goal Barriers to meeting goals:  no barriers identified  Plan: Hypertension treatment:  continue current medications

## 2012-06-04 NOTE — Assessment & Plan Note (Signed)
Lab Results  Component Value Date   HGBA1C 7.1 04/28/2012   HGBA1C 7.6* 01/10/2012   CREATININE 0.76 05/13/2012   CREATININE 0.78 01/10/2012   MICROALBUR 0.86 01/30/2012   MICRALBCREAT 4.5 01/30/2012   CHOL 184 04/28/2012   HDL 40 04/28/2012   TRIG 335* 04/28/2012    Last eye exam and foot exam: No results found for this basename: HMDIABEYEEXA, HMDIABFOOTEX    Assessment: Diabetes control: controlled Progress toward goals: at goal Barriers to meeting goals: no barriers identified  Plan: Diabetes treatment: continue current medications Refer to: none Instruction/counseling given: reminded to bring blood glucose meter & log to each visit, reminded to bring medications to each visit, discussed foot care, discussed the need for weight loss and discussed diet

## 2012-06-12 ENCOUNTER — Other Ambulatory Visit: Payer: Self-pay | Admitting: Internal Medicine

## 2012-06-12 DIAGNOSIS — R768 Other specified abnormal immunological findings in serum: Secondary | ICD-10-CM

## 2012-06-13 ENCOUNTER — Ambulatory Visit (INDEPENDENT_AMBULATORY_CARE_PROVIDER_SITE_OTHER): Payer: Self-pay | Admitting: *Deleted

## 2012-06-13 DIAGNOSIS — Z23 Encounter for immunization: Secondary | ICD-10-CM

## 2012-07-07 ENCOUNTER — Telehealth: Payer: Self-pay | Admitting: *Deleted

## 2012-07-07 NOTE — Telephone Encounter (Signed)
Pt called - past 4 days problems with back and leg pain. Pt aware of appt 07/08/12 9:15AM Dr Clyde Lundborg. Carmel Waddington RN 07/07/12 2PM

## 2012-07-08 ENCOUNTER — Ambulatory Visit (INDEPENDENT_AMBULATORY_CARE_PROVIDER_SITE_OTHER): Payer: Self-pay | Admitting: Internal Medicine

## 2012-07-08 ENCOUNTER — Encounter: Payer: Self-pay | Admitting: Internal Medicine

## 2012-07-08 VITALS — BP 129/85 | HR 90 | Temp 97.0°F | Ht 69.0 in | Wt 183.7 lb

## 2012-07-08 DIAGNOSIS — M79606 Pain in leg, unspecified: Secondary | ICD-10-CM

## 2012-07-08 DIAGNOSIS — G629 Polyneuropathy, unspecified: Secondary | ICD-10-CM

## 2012-07-08 DIAGNOSIS — K219 Gastro-esophageal reflux disease without esophagitis: Secondary | ICD-10-CM

## 2012-07-08 DIAGNOSIS — I10 Essential (primary) hypertension: Secondary | ICD-10-CM

## 2012-07-08 DIAGNOSIS — E119 Type 2 diabetes mellitus without complications: Secondary | ICD-10-CM

## 2012-07-08 DIAGNOSIS — M79609 Pain in unspecified limb: Secondary | ICD-10-CM

## 2012-07-08 LAB — GLUCOSE, CAPILLARY: Glucose-Capillary: 182 mg/dL — ABNORMAL HIGH (ref 70–99)

## 2012-07-08 LAB — BASIC METABOLIC PANEL
CO2: 26 mEq/L (ref 19–32)
Calcium: 9.4 mg/dL (ref 8.4–10.5)
Creat: 0.82 mg/dL (ref 0.50–1.35)
Glucose, Bld: 180 mg/dL — ABNORMAL HIGH (ref 70–99)
Sodium: 138 mEq/L (ref 135–145)

## 2012-07-08 MED ORDER — OMEPRAZOLE 20 MG PO CPDR: 20.0000 mg | DELAYED_RELEASE_CAPSULE | Freq: Every day | ORAL | Status: DC

## 2012-07-08 MED ORDER — GABAPENTIN 300 MG PO CAPS: 600.0000 mg | ORAL_CAPSULE | Freq: Three times a day (TID) | ORAL | Status: DC

## 2012-07-08 NOTE — Patient Instructions (Signed)
1. please take Neurontin 600 mg 3 times a day from now. 2. Please take all medications as prescribed.  3. If you have worsening of your symptoms or new symptoms arise, please call the clinic ([phone]), or go to the ER immediately if symptoms are severe.

## 2012-07-08 NOTE — Assessment & Plan Note (Signed)
It is controlled well with Prilosec. Patient does not have new symptoms. We'll give patient a refill prescription for Prilosec.

## 2012-07-08 NOTE — Progress Notes (Signed)
[name], male   DOB: 1961-01-04, 51 y.o.   MRN: [id number]  Subjective:   [name] male   DOB: [birth year] 51 y.o.   MRN: [id number]  HPI: [name] is a 51 y.o. with a past medical history as outlined below, who presents for an acute visit.   The patient reports that he has chronic leg pain. His leg pain is mainly located at bilateral lower legs, and radiating up to the thighs. It is tingling-like pain. Patient has been diagnosed as peripheral neuropathy. He has been taking Neurontin 600 mg twice a day without significant improvement. Today, he reports that his leg pain is worse on the right side, mainly located at the lower leg and also at posterior for thigh. He does not have weakness in his legs. He does not have lower back pain today (he had lower back pain before). He does not have urinary incontinence or lose control full bowel moments. He denies any injury to his legs.   Denies fever, chills, fatigue, headaches,  cough, chest pain, SOB,  abdominal pain,diarrhea, constipation, dysuria, urgency, frequency, hematuria.     Past Medical History  Diagnosis Date  . Diabetes mellitus   . Neuromuscular disorder    Current Outpatient Prescriptions  Medication Sig Dispense Refill  . Aspirin-Salicylamide-Caffeine (ARTHRITIS STRENGTH BC POWDER PO) Take 1-2 packets by mouth daily as needed. For pain      . cyclobenzaprine (FLEXERIL) 5 MG tablet Take 5 mg by mouth 3 (three) times daily as needed.      . gabapentin (NEURONTIN) 300 MG capsule Take 2 capsules (600 mg total) by mouth 2 (two) times daily.  60 capsule  3  . lisinopril (PRINIVIL,ZESTRIL) 5 MG tablet Take 1 tablet (5 mg total) by mouth daily.  30 tablet  3  . meloxicam (MOBIC) 7.5 MG tablet Take 1 pill in the morning for pain.  You may take a second pill if needed.  60 tablet  1  . metFORMIN (GLUCOPHAGE) 1000 MG tablet Take 1 tablet by mouth twice daily.  60 tablet  3  . omeprazole (PRILOSEC) 20 MG capsule Take  1 capsule (20 mg total) by mouth daily.  30 capsule  3   No family history on file. History   Social History  . Marital Status: Single    Spouse Name: N/A    Number of Children: N/A  . Years of Education: N/A   Social History Main Topics  . Smoking status: Current Some Day Smoker -- 0.1 packs/day    Types: Cigarettes    Last Attempt to Quit: 01/05/2012  . Smokeless tobacco: Never Used  . Alcohol Use: No  . Drug Use: No  . Sexually Active: Yes    Birth Control/ Protection: None   Other Topics Concern  . None   Social History Narrative  . None   Review of Systems:  General: no fevers, chills, no changes in body weight, no changes in appetite Skin: no rash HEENT: no blurry vision, hearing changes or sore throat Pulm: no dyspnea, coughing, wheezing CV: no chest pain, palpitations, shortness of breath Abd: no nausea/vomiting, abdominal pain, diarrhea/constipation GU: no dysuria, hematuria, polyuria Ext: Bilateral leg pain  Neuro: no weakness, numbness, or tingling   Objective:  Physical Exam: Filed Vitals:   07/08/12 0931  BP: 129/85  Pulse: 90  Temp: 97 F (36.1 C)  TempSrc: Oral  Height: 5\' 9"  (1.753 m)  Weight: 183 lb 11.2 oz (83.326 kg)  general: resting in bed, not in acute distress HEENT: PERRL, EOMI, no scleral icterus Cardiac: S1/S2, RRR, No murmurs, gallops or rubs Pulm: Good air movement bilaterally, Clear to auscultation bilaterally, No rales, wheezing, rhonchi or rubs. Abd: Soft,  nondistended, nontender, no rebound pain, no organomegaly, BS present Ext: No rashes or edema, 2+DP/PT pulse bilaterally. Musculoskeletal: No joint deformities, erythema, or stiffness. There is no tenderness over the midline of her lower back.  Skin: no rashes. No skin bruise. Neuro: alert and oriented X3, cranial nerves II-XII grossly intact, muscle strength 5/5 in all extremeties,  sensation to light touch intact. Left raise test is positive bilaterally.  Psych.:  patient is not psychotic, no suicidal or hemocidal ideation.    Assessment & Plan:

## 2012-07-08 NOTE — Assessment & Plan Note (Signed)
It is well controlled. Today blood pressure is 129/85. We'll continue current regimen.

## 2012-07-08 NOTE — Assessment & Plan Note (Addendum)
Etiology for his leg pain is not clear currently. He has been diagnosed in the past with peripheral neuropathy possibly due to diabetes, however her diabetes seems to be well controlled. His recent A1c on 04/28/12 was 7.1.  Patient had normal vitamin B12 and TSH tests at 05/13/12. His leg pain is mainly located at bilateral lower legs and also at posterior of the thighs. His leg raise test is positive bilaterally. These indicate that there is a possibility for patient to have a sciatica nerve compression.   -Will treat the patient with increased dose of Neurontin, 600 mg, 3 times a day from now. -will get MRI of lumbar spine.

## 2012-07-10 ENCOUNTER — Inpatient Hospital Stay (HOSPITAL_COMMUNITY): Admission: RE | Admit: 2012-07-10 | Payer: Self-pay | Source: Ambulatory Visit

## 2012-07-15 ENCOUNTER — Ambulatory Visit (HOSPITAL_COMMUNITY)
Admission: RE | Admit: 2012-07-15 | Discharge: 2012-07-15 | Disposition: A | Discharge status: Home / Self Care | Payer: Self-pay | Source: Ambulatory Visit | Attending: Internal Medicine | Admitting: Internal Medicine

## 2012-07-15 DIAGNOSIS — G629 Polyneuropathy, unspecified: Secondary | ICD-10-CM

## 2012-07-15 DIAGNOSIS — M79609 Pain in unspecified limb: Secondary | ICD-10-CM | POA: Insufficient documentation

## 2012-07-15 DIAGNOSIS — M47817 Spondylosis without myelopathy or radiculopathy, lumbosacral region: Secondary | ICD-10-CM | POA: Insufficient documentation

## 2012-07-15 DIAGNOSIS — M79606 Pain in leg, unspecified: Secondary | ICD-10-CM

## 2012-07-15 DIAGNOSIS — M519 Unspecified thoracic, thoracolumbar and lumbosacral intervertebral disc disorder: Secondary | ICD-10-CM | POA: Insufficient documentation

## 2012-07-15 DIAGNOSIS — M549 Dorsalgia, unspecified: Secondary | ICD-10-CM | POA: Insufficient documentation

## 2012-07-15 MED ORDER — GADOBENATE DIMEGLUMINE 529 MG/ML IV SOLN
18.0000 mL | Freq: Once | INTRAVENOUS | Status: AC
  Administered 2012-07-15: 18 mL via INTRAVENOUS

## 2012-07-21 ENCOUNTER — Encounter: Payer: Self-pay | Admitting: *Deleted

## 2012-07-21 ENCOUNTER — Ambulatory Visit (INDEPENDENT_AMBULATORY_CARE_PROVIDER_SITE_OTHER): Payer: Self-pay | Admitting: Internal Medicine

## 2012-07-21 ENCOUNTER — Encounter: Payer: Self-pay | Admitting: Internal Medicine

## 2012-07-21 VITALS — BP 127/85 | HR 66 | Wt 185.2 lb

## 2012-07-21 DIAGNOSIS — I10 Essential (primary) hypertension: Secondary | ICD-10-CM

## 2012-07-21 DIAGNOSIS — Z23 Encounter for immunization: Secondary | ICD-10-CM

## 2012-07-21 DIAGNOSIS — G8929 Other chronic pain: Secondary | ICD-10-CM

## 2012-07-21 DIAGNOSIS — M545 Low back pain: Secondary | ICD-10-CM

## 2012-07-21 DIAGNOSIS — Z79899 Other long term (current) drug therapy: Secondary | ICD-10-CM

## 2012-07-21 DIAGNOSIS — E119 Type 2 diabetes mellitus without complications: Secondary | ICD-10-CM

## 2012-07-21 MED ORDER — DICLOFENAC SODIUM 1 % TD GEL: 1.0000 "application " | Freq: Four times a day (QID) | TRANSDERMAL | Status: DC

## 2012-07-21 NOTE — Progress Notes (Signed)
Pt presents c/o pain from waist down to toes on R side, L side from just above knee down, he states he cannot even stand to put his wallet in his pocket, pain is ongoing for several years- since becoming a diabetic. He has increased his meds himself and that does not help. appt is given this am w/ dr Milbert Coulter at [facility]

## 2012-07-21 NOTE — Assessment & Plan Note (Signed)
Lab Results  Component Value Date   NA 138 07/08/2012   K 4.4 07/08/2012   CL 103 07/08/2012   CO2 26 07/08/2012   BUN 15 07/08/2012   CREATININE 0.82 07/08/2012   CREATININE 0.78 01/10/2012    BP Readings from Last 3 Encounters:  07/21/12 127/85  07/08/12 129/85  06/02/12 100/75    Assessment: Hypertension control:  controlled  Progress toward goals:  at goal Barriers to meeting goals:  no barriers identified  Plan: Hypertension treatment:  continue current medications Lisinopril 5 mg daily

## 2012-07-21 NOTE — Progress Notes (Signed)
Subjective:   [name] male   DOB: 07-Apr-1961 51 y.o.   MRN: [id number]  HPI: Mr.Dray Chriscoe is a 51 y.o. man with history of diabetes, hypertension, and chronic low back pain. He presents today for an acute visit 2 to worsening back pain over the weekend. He reports that the pain is so bad that it takes his breath away, 11 out of 10. He describes pain as sharp, non-, tingling, radiating down the posterior aspect of his legs. He has tried all of his medications including Flexeril, gabapentin, and meloxicam. He has even taken extra Flexeril and gabapentin with minimal relief. He has also tried warm showers and warm compresses as well as rubbing alcohol. Pain is not specifically related to movement, and that it hurts at rest and with movement. He reports that numbness and tingling radiates to his bilateral lower extremities, but is worse on the right; the left bothers him from the knee down. He notes that his bilateral toe numbness. No urinary or bladder incontinence. No sexual dysfunction. He recently had an MRI done in July, that revealed some mild disc bulging. Does not complaining of muscle cramping.  Diabetes: Diagnosed in 2007. He has not checked his blood sugar in the last year since moving from [place]. He is compliant with metformin 1000 mg twice daily. He reports that he is not able to exercise. He tries to control his diet, including eating wheat bread. He thinks his diabetes is gotten worse as he has eaten white bread recently. No symptoms of hypoglycemia including headaches, nausea, dizziness, or diaphoresis. No symptoms of hyperglycemia including polyuria, polyphasia, polydipsia.   Review of Systems: Constitutional: Denies fever, chills, diaphoresis, appetite change and fatigue.  HEENT: Denies photophobia, eye pain, redness, hearing loss, ear pain, congestion, sore throat, rhinorrhea, sneezing, mouth sores, trouble swallowing, neck pain, neck stiffness and tinnitus.     Respiratory: Denies DOE, cough, chest tightness,  and wheezing.   Cardiovascular: Denies chest pain, palpitations and leg swelling.  Gastrointestinal: Denies nausea, vomiting, abdominal pain, diarrhea, constipation, blood in stool and abdominal distention.  Genitourinary: Denies dysuria, urgency, frequency, hematuria, flank pain and difficulty urinating.  Musculoskeletal: As per history of present illness Skin: Denies pallor, rash and wound.  Neurological: Denies dizziness, seizures, syncope, light-headedness, and headaches.  Psychiatric/Behavioral: Denies suicidal ideation, mood changes, confusion, nervousness, sleep disturbance and agitation  Objective:  Physical Exam: Filed Vitals:   07/21/12 0936 07/21/12 1009  BP: 135/90 127/85  Pulse: 74 66  Weight: 185 lb 3.2 oz (84.006 kg)    Constitutional: Vital signs reviewed.  Patient is a well-developed and well-nourished man in no acute distress and cooperative with exam. Eyes: PERRL, EOMI, conjunctivae normal, No scleral icterus.  Cardiovascular: RRR, S1 normal, S2 normal, no MRG, pulses symmetric and intact bilaterally Pulmonary/Chest: CTAB, no wheezes, rales, or rhonchi Abdominal: Soft. Non-tender, non-distended, bowel sounds are normal, no masses, organomegaly, or guarding present.  Musculoskeletal: No joint deformities or erythema; positive straight leg test on the right; full range of motion of bilateral knees ankles and toes; tenderness to palpation of right paraspinal area near L4-L5; no gross deformities along spine  Neurological: A&O x3, Strength is 4+ out of 5 bilaterally, DTRs grossly intact, cranial nerve II-XII are grossly intact, no focal motor deficit, sensation mildly diminished on bilateral lateral aspects of the feet over thick skin Skin: Warm, dry and intact. No rash, cyanosis, or clubbing.  Psychiatric: Normal mood and affect. speech and behavior is normal. Judgment and thought content  normal. Cognition and memory are  normal.   Assessment & Plan:   Case and care discussed with Dr. Meredith Pel. Patient to return to followup with PCP regarding diabetes later this month. Please see problem-oriented charting for further details.

## 2012-07-21 NOTE — Assessment & Plan Note (Addendum)
Unclear etiology.  No recent malaise/fatigue/illness.  Unlikely diabetic neuropathy given that DM is not that poorly controlled (though patient reports poorer control in the past).  Symptoms suggestive of b/l sciatica but MRI is not very impressive. Tenderness along right paraspinal area around L4-L5,  But no significant muscle tension.  Neuropathy not d/t TSH & B12 def, as both were wnl in 04/2012 and this problem has been on going since 2007 since DM diagnosis.  No red flag s/s.  Plan: -Voltaren Gel, if patient cannot afford this medication, instructed him to try icy/hot -Continue Gabapentin, Flexeril and Mobic (renal fxn wnl) -Neuro consult to try to identify etiology of pain - seems out of proportion to mild disc bulging in imaging - role for EMG vs nerve conduction study?  -PT consult once we confirm no neuro insult

## 2012-07-21 NOTE — Assessment & Plan Note (Addendum)
Lab Results  Component Value Date   HGBA1C 7.6 07/21/2012   HGBA1C 7.6* 01/10/2012   CREATININE 0.82 07/08/2012   CREATININE 0.78 01/10/2012   MICROALBUR 0.86 01/30/2012   MICRALBCREAT 4.5 01/30/2012   CHOL 184 04/28/2012   HDL 40 04/28/2012   TRIG 335* 04/28/2012   Assessment: Diabetes control: not controlled Progress toward goals: deteriorated Barriers to meeting goals: lack of understanding of disease management  Plan: Diabetes treatment:Metformin 1000 BID -  continue current medications ; Patient does not have glucometer with him today. He did not have any episodes of hypoglycemia. He has not checked his blood sugars at home for the last year. We will: Testred, and have him check his blood sugar at least once a day. I will also refer him to our dietitian. Once we can review his tocometer at the next visit, may consider initiation of glipizide 5 mg daily. Refer to: diabetes educator for medical nutrition therapy Instruction/counseling given: reminded to bring blood glucose meter & log to each visit, reminded to bring medications to each visit and discussed diet

## 2012-07-21 NOTE — Patient Instructions (Addendum)
-   I am referring you to Neurology to see if we can identify the cause of your chronic low back pain.  While you do have a bulging disk, the MRI does not suggest it should cause the significant amount of pain that you are experiencing, so we need to rule out other causes.  -You may use a Voltaren gel (which I have sent in to your pharmacy) up to 4 times/day over the painful areas to help manage your symptoms.  If you cannot afford the Voltaren gel, then try the Icy/Hot gel over the counter- while it is not the same medication, it may still help.  -If you have symptoms such as sexual dysfunction, or have a urinary or bowel accident, it is important that you go to your nearest emergency department.  -Please be sure to check your blood sugar at least once/day and bring your glucometer with you to your next visit.    -Please schedule an appointment with our dietician, Norm Parcel.  Please be sure to bring all of your medications with you to every visit.  Should you have any new or worsening symptoms, please be sure to call the clinic at [phone].

## 2012-07-21 NOTE — Progress Notes (Signed)
Thank you, Chabeli Barsamian 

## 2012-07-28 ENCOUNTER — Encounter (HOSPITAL_COMMUNITY): Payer: Self-pay | Admitting: *Deleted

## 2012-07-28 ENCOUNTER — Emergency Department (HOSPITAL_COMMUNITY)
Admission: EM | Admit: 2012-07-28 | Discharge: 2012-07-28 | Disposition: A | Discharge status: Home / Self Care | Payer: Self-pay | Attending: Emergency Medicine | Admitting: Emergency Medicine

## 2012-07-28 DIAGNOSIS — M5440 Lumbago with sciatica, unspecified side: Secondary | ICD-10-CM

## 2012-07-28 DIAGNOSIS — F17200 Nicotine dependence, unspecified, uncomplicated: Secondary | ICD-10-CM | POA: Insufficient documentation

## 2012-07-28 DIAGNOSIS — M5430 Sciatica, unspecified side: Secondary | ICD-10-CM | POA: Insufficient documentation

## 2012-07-28 DIAGNOSIS — M545 Low back pain: Secondary | ICD-10-CM | POA: Diagnosis not present

## 2012-07-28 DIAGNOSIS — I10 Essential (primary) hypertension: Secondary | ICD-10-CM | POA: Insufficient documentation

## 2012-07-28 DIAGNOSIS — E119 Type 2 diabetes mellitus without complications: Secondary | ICD-10-CM | POA: Insufficient documentation

## 2012-07-28 DIAGNOSIS — K859 Acute pancreatitis without necrosis or infection, unspecified: Secondary | ICD-10-CM | POA: Insufficient documentation

## 2012-07-28 MED ORDER — TRAMADOL HCL 50 MG PO TABS: 50.0000 mg | ORAL_TABLET | Freq: Four times a day (QID) | ORAL | Status: AC | PRN

## 2012-07-28 MED ORDER — HYDROMORPHONE HCL PF 2 MG/ML IJ SOLN
2.0000 mg | Freq: Once | INTRAMUSCULAR | Status: AC
  Administered 2012-07-28: 2 mg via INTRAMUSCULAR
  Filled 2012-07-28: qty 2

## 2012-07-28 MED ORDER — KETOROLAC TROMETHAMINE 60 MG/2ML IM SOLN
60.0000 mg | Freq: Once | INTRAMUSCULAR | Status: AC
  Administered 2012-07-28: 60 mg via INTRAMUSCULAR
  Filled 2012-07-28: qty 2

## 2012-07-28 MED ORDER — CYCLOBENZAPRINE HCL 10 MG PO TABS
10.0000 mg | ORAL_TABLET | Freq: Once | ORAL | Status: AC
  Administered 2012-07-28: 10 mg via ORAL
  Filled 2012-07-28: qty 1

## 2012-07-28 NOTE — ED Notes (Addendum)
C/o low back pain radiating down into BLE x 8 months. Has been f/u by PCP for same, had MRI last month & was instructed to f/u with a neurologist but has not done so presently. C/o increased pain now radiating into RLE only & with numbness x 3 weeks.  Pt ambulatory from triage & drove self here to ED

## 2012-07-28 NOTE — ED Provider Notes (Signed)
History     CSN: [id number]  Arrival date & time 07/28/12  4098   First MD Initiated Contact with Patient 07/28/12 [phone]      Chief Complaint  Patient presents with  . Back Pain    HPI 51 yo male with h/o DM, HTN and chronic back pain who presents with acutely worsening right sided low back pain that radiates down the back of his right leg down to his foot. Came to the ED today because pain was keeping him from sleep. He has had chronic back pain for the last few months. Acutely, Nothing makes it worst. He has been taking mobic bid, gabapentin 600mg  tid and flexeril 5mg  tid and icy hot patches with little relief. Denies any acute weakness in right leg. Is able to walk. Complains of some numbness in right leg. No bowel or urinary incontinence. No fevers or chills. No recent weight loss.  Had MRI on 07/15/12 that showed mild disc bulging at L4-L5 with mild foraminal narrowing as well as L5-S1 mild disc bulging without stenosis. Was recently seen at [facility] office for this.  Past Medical History  Diagnosis Date  . Diabetes mellitus   . Neuromuscular disorder   . Pancreatitis 01/09/2012  . Hypertension goal BP (blood pressure) < 140/80     History reviewed. No pertinent past surgical history.  History reviewed. No pertinent family history.  History  Substance Use Topics  . Smoking status: Current Some Day Smoker -- 0.1 packs/day    Types: Cigarettes    Last Attempt to Quit: 01/05/2012  . Smokeless tobacco: Never Used  . Alcohol Use: 3.0 oz/week    5 Cans of beer per week     Review of Systems  All other systems reviewed and are negative.   Allergies  Review of patient's allergies indicates no known allergies.  Home Medications   Current Outpatient Rx  Name Route Sig Dispense Refill  . CYCLOBENZAPRINE HCL 5 MG PO TABS Oral Take 5 mg by mouth 3 (three) times daily as needed.    Marland Kitchen GABAPENTIN 300 MG PO CAPS Oral Take 2 capsules (600 mg total) by mouth 3 (three) times daily. 90  capsule 3  . LISINOPRIL 5 MG PO TABS Oral Take 1 tablet (5 mg total) by mouth daily. 30 tablet 3  . MELOXICAM 7.5 MG PO TABS  Take 1 pill in the morning for pain.  You may take a second pill if needed. 60 tablet 1  . METFORMIN HCL 1000 MG PO TABS  Take 1 tablet by mouth twice daily. 60 tablet 3  . OMEPRAZOLE 20 MG PO CPDR Oral Take 1 capsule (20 mg total) by mouth daily. 30 capsule 3    BP 156/87  Pulse 63  Temp 98.1 F (36.7 C) (Oral)  Resp 16  SpO2 98%  Physical Exam  Constitutional: He is oriented to person, place, and time. No distress.  Cardiovascular: Normal rate, regular rhythm and normal heart sounds.   No murmur heard. Pulmonary/Chest: Effort normal and breath sounds normal. No respiratory distress.  Abdominal: Soft. He exhibits no distension. There is no tenderness.  Musculoskeletal:       Positive straight leg on right. Negative left straight leg. Decreased sensation to light touch along dorsum of right foot. 5/5 strength in bilateral plantar flexion, dorsiflexion, knee extension and flexion (test done once pain controled). Tenderness to palpation along right lumbar paraspinal muscles as well as along right sciatic notch.     Neurological: He is  alert and oriented to person, place, and time. He has normal reflexes. No cranial nerve deficit.  Skin: Skin is warm and dry.    ED Course  Procedures (including critical care time)  Labs Reviewed - No data to display No results found.   1. Low back pain with sciatica      MDM  7:30am: patient seen and evaluated. Back pain likely from sciatica at level of L4-L5, given recent MRI findings. Other than pain, no change in neurological findings, no acute weakness. No evidence of cauda equina or spinal infectious process.   dilaudid 2mg  IM, toradol 60mg  IM and flexeril 5mg  po given in ED Patient's pain improved from 10/10 to 5/10 and stable to d/c home on previously prescribed flexeril and mobic as well as tramadol 50-100 every 6  hours. Patient to follow up with PCP.  Marena Chancy, PGY-2 [facility]         Lonia Skinner, MD 07/28/12 [phone]

## 2012-07-28 NOTE — ED Provider Notes (Signed)
I  reviewed the resident's note and I agree with the findings and plan.  Cheri Guppy, MD 07/28/12 1600

## 2012-07-28 NOTE — ED Notes (Signed)
Pt states that he is a diabetic and normally has leg pain.pt states that his leg pain is not moved up and to his right side and now across his back. Pt states that his back feels twisted and achy. Pt states that he got rx medications and has been taking more than normal to help with his pain.

## 2012-08-01 ENCOUNTER — Encounter: Payer: Self-pay | Admitting: Physical Medicine & Rehabilitation

## 2012-08-04 ENCOUNTER — Encounter: Payer: Self-pay | Admitting: Internal Medicine

## 2012-08-04 ENCOUNTER — Ambulatory Visit: Payer: Self-pay | Admitting: Dietician

## 2012-08-04 ENCOUNTER — Encounter: Payer: Self-pay | Admitting: Dietician

## 2012-08-04 ENCOUNTER — Ambulatory Visit (INDEPENDENT_AMBULATORY_CARE_PROVIDER_SITE_OTHER): Payer: Self-pay | Admitting: Internal Medicine

## 2012-08-04 VITALS — BP 124/85 | HR 88 | Temp 97.8°F | Ht 69.0 in | Wt 184.5 lb

## 2012-08-04 DIAGNOSIS — E119 Type 2 diabetes mellitus without complications: Secondary | ICD-10-CM

## 2012-08-04 DIAGNOSIS — G8929 Other chronic pain: Secondary | ICD-10-CM

## 2012-08-04 DIAGNOSIS — M545 Low back pain: Secondary | ICD-10-CM

## 2012-08-04 DIAGNOSIS — I10 Essential (primary) hypertension: Secondary | ICD-10-CM

## 2012-08-04 DIAGNOSIS — M549 Dorsalgia, unspecified: Secondary | ICD-10-CM

## 2012-08-04 LAB — CBC WITH DIFFERENTIAL/PLATELET
Basophils Absolute: 0.1 10*3/uL (ref 0.0–0.1)
Basophils Relative: 1 % (ref 0–1)
Eosinophils Absolute: 0 10*3/uL (ref 0.0–0.7)
HCT: 43.2 % (ref 39.0–52.0)
MCH: 23.4 pg — ABNORMAL LOW (ref 26.0–34.0)
MCHC: 33.6 g/dL (ref 30.0–36.0)
Monocytes Absolute: 0.6 10*3/uL (ref 0.1–1.0)
Monocytes Relative: 9 % (ref 3–12)
Neutro Abs: 2.7 10*3/uL (ref 1.7–7.7)
RDW: 14.8 % (ref 11.5–15.5)

## 2012-08-04 NOTE — Patient Instructions (Signed)
Please schedule a follow up appointment in 1-2 months or earlier if needed. Please bring your medication bottles with your next appointment. Please take your medicines as prescribed. I will call you with your lab results if anything will be abnormal. Please take your neurontin at increased dose ( 600 mg , three times a day).

## 2012-08-04 NOTE — Assessment & Plan Note (Addendum)
Patient has been seen in the clinic in ED multiple times in last 2-3 months for the similar complaint. On exam he had some muscle tightness in the right mid lower back and positive right SLRT. The nature of his pain is likely consistent with sciatica but the MRI of his back in July 2013 just revealed mild disc bulging and spondylosis at L4-L5, contributing to mild foraminal encroachment. He was advised to take 600 mg of Neurontin 3 times a day but he has been just taking 300 mg 2 times a day. He was also prescribed tramadol by the ER physician but states that that medication doesn't help him. He has also tried meloxicam and Flexeril in addition without much relief .He was given dilaudid shot in the ER which he says helped him for couple of days . He has been referred to physical therapy in his previous appointments but he hasn't been able to follow up his he did not have orange card. Neurology referral was also made during the last visit but it cannot be pursued with his orange card. -ESR to look for any inflammatory process. -Patient has orange card now- refer him to physical therapy again.He was encouraged to follow up this time.  -Advised him to take 600 mg of Neurontin 3 times a day.   His case was discussed with Dr. Aundria Rud.  Update: His ESR is 1( WNL) - doubt that their is any infectious etiology( like abscess)  to his symptoms. Continue conservative management.

## 2012-08-04 NOTE — Progress Notes (Signed)
  Subjective:    [name], male    DOB: Nov 02, 1961, 51 y.o.   MRN: [id number]  HPI: 51 year old man with past medical history significant for diabetes mellitus, chronic low back pain comes to the clinic for back pain.  1. Back pain : Patient has been seen in our clinic and in the ER several times for the similar complaint with no relief of his symptoms. He has also had MRI of his lumbar spine that reveals mild disc bulging. He complains of sharp pain in his lower back - He states that his pain moves- sometimes would go to the mid upper back , describes it as "someone is putting in and pulling out a nail" , rates it 10/10( right worse than left). Radiates down his legs( right worse than left). Also complains of increased sweats lately .  He was also seen in the ER about 1 week ago and was given a dilaudid shot that he thinks really helped him. He has tried neurontin, meloxicam and flexeril without much relief. Denies any weight loss or low grade fever, loss of bladder or bowel control.    2. Diabetes: His CBG was 73 in the clinic. Had a glass of pepsi and peanut this AM and  took his medicine( metformin). He states that he is also feeling little woozy. Denies any hypoglycemic events at home.   Review of Systems  Constitutional: Negative for fever and appetite change.  HENT: Negative for nosebleeds, congestion, rhinorrhea, neck pain, neck stiffness and postnasal drip.   Eyes: Negative for visual disturbance.  Respiratory: Negative for apnea, choking and chest tightness.   Cardiovascular: Negative for chest pain and leg swelling.  Musculoskeletal: Positive for back pain.  Neurological: Negative for dizziness, speech difficulty, light-headedness, numbness and headaches.  Hematological: Negative for adenopathy.  Psychiatric/Behavioral: Negative for agitation.       Objective:   Physical Exam  Constitutional: He is oriented to person, place, and time. He appears well-developed and  well-nourished. No distress.  HENT:  Head: Normocephalic and atraumatic.  Mouth/Throat: No oropharyngeal exudate.  Eyes: Conjunctivae and EOM are normal. Pupils are equal, round, and reactive to light.  Neck: Normal range of motion. Neck supple. No JVD present. No tracheal deviation present. No thyromegaly present.  Cardiovascular: Normal rate, regular rhythm, normal heart sounds and intact distal pulses.  Exam reveals no gallop and no friction rub.   No murmur heard. Pulmonary/Chest: Effort normal and breath sounds normal. No stridor. No respiratory distress. He has no wheezes. He has no rales.  Abdominal: Soft. Bowel sounds are normal. He exhibits no distension. There is no tenderness. There is no rebound.  Musculoskeletal: Normal range of motion. He exhibits no edema and no tenderness.       Right SLRT positive, left negative.  Right sided paraspinal tenderness present with some muscle tightness  Neurological: He is alert and oriented to person, place, and time. He has normal reflexes. No cranial nerve deficit. Coordination normal.       Strength - 5/5 bilaterally in all four extremities, sensations intact to light touch .  Skin: Skin is warm. He is not diaphoretic.          Assessment & Plan:

## 2012-08-04 NOTE — Assessment & Plan Note (Signed)
Lab Results  Component Value Date   NA 138 07/08/2012   K 4.4 07/08/2012   CL 103 07/08/2012   CO2 26 07/08/2012   BUN 15 07/08/2012   CREATININE 0.82 07/08/2012   CREATININE 0.78 01/10/2012    BP Readings from Last 3 Encounters:  08/04/12 124/85  07/28/12 156/87  07/21/12 127/85    Assessment: Hypertension control:  controlled  Progress toward goals:  at goal Barriers to meeting goals:  no barriers identified  Plan: Hypertension treatment:  continue current medications

## 2012-08-04 NOTE — Assessment & Plan Note (Signed)
His CBG was 73 in the clinic today. He did not have a proper meal throughout the whole day-just had some peanut butter with pepsi. - He was given snacks in the clinic. - Continue  current regimen

## 2012-08-05 LAB — SEDIMENTATION RATE: Sed Rate: 1 mm/hr (ref 0–16)

## 2012-08-05 NOTE — Progress Notes (Signed)
Patient did not stay for appointment. Called patient and he rescheduled for Thursday at 9:30 AM.

## 2012-08-07 ENCOUNTER — Ambulatory Visit (INDEPENDENT_AMBULATORY_CARE_PROVIDER_SITE_OTHER): Payer: Self-pay | Admitting: Dietician

## 2012-08-07 ENCOUNTER — Other Ambulatory Visit (INDEPENDENT_AMBULATORY_CARE_PROVIDER_SITE_OTHER): Payer: Self-pay

## 2012-08-07 DIAGNOSIS — G8929 Other chronic pain: Secondary | ICD-10-CM

## 2012-08-07 DIAGNOSIS — I10 Essential (primary) hypertension: Secondary | ICD-10-CM

## 2012-08-07 DIAGNOSIS — E119 Type 2 diabetes mellitus without complications: Secondary | ICD-10-CM

## 2012-08-07 DIAGNOSIS — IMO0002 Reserved for concepts with insufficient information to code with codable children: Secondary | ICD-10-CM

## 2012-08-07 LAB — COMPLETE METABOLIC PANEL WITH GFR
ALT: 26 U/L (ref 0–53)
Albumin: 3.9 g/dL (ref 3.5–5.2)
Alkaline Phosphatase: 88 U/L (ref 39–117)
CO2: 26 mEq/L (ref 19–32)
GFR, Est African American: >89 mL/min
GFR, Est Non African American: 81 mL/min
Glucose, Bld: 184 mg/dL — ABNORMAL HIGH (ref 70–99)
Potassium: 4.4 mEq/L (ref 3.5–5.3)
Sodium: 137 mEq/L (ref 135–145)
Total Protein: 7.4 g/dL (ref 6.0–8.3)

## 2012-08-07 NOTE — Patient Instructions (Addendum)
  Try to eat 3 balanced meals a day.  Look for carbohydrates on labels instead of just sugar.   Drink small amounts 100% orange juice instead of soda.  To fit cake in leave off fruit and starch.  Bake, grill, oven fry (shake'n bake), stir fry.  Please make a follow up for 4-6 weeks to see how you are doing with the plate method fo meal planning.  Bring your meter with you ( that you get form teh health department.)       Back Exercises Back exercises help treat and prevent back injuries. The goal is to increase your strength in your belly (abdominal) and back muscles. These exercises can also help with flexibility. Start these exercises when told by your doctor. HOME CARE Back exercises include: Pelvic Tilt.  Lie on your back with your knees bent. Tilt your pelvis until the lower part of your back is against the floor. Hold this position 5 to 10 sec. Repeat this exercise 5 to 10 times.  Knee to Chest.  Pull 1 knee up against your chest and hold for 20 to 30 seconds. Repeat this with the other knee. This may be done with the other leg straight or bent, whichever feels better. Then, pull both knees up against your chest.  Sit-Ups or Curl-Ups.  Bend your knees 90 degrees. Start with tilting your pelvis, and do a partial, slow sit-up. Only lift your upper half 30 to 45 degrees off the floor. Take at least 2 to 3 seonds for each sit-up. Do not do sit-ups with your knees out straight. If partial sit-ups are difficult, simply do the above but with only tightening your belly (abdominal) muscles and holding it as told.  Hip-Lift.  Lie on your back with your knees flexed 90 degrees. Push down with your feet and shoulders as you raise your hips 2 inches off the floor. Hold for 10 seconds, repeat 5 to 10 times.  Back Arches.  Lie on your stomach. Prop yourself up on bent elbows. Slowly press on your hands, causing an arch in your low back. Repeat 3 to 5 times.  Shoulder-Lifts.  Lie face  down with arms beside your body. Keep hips and belly pressed to floor as you slowly lift your head and shoulders off the floor.  Do not overdo your exercises. Be careful in the beginning. Exercises may cause you some mild back discomfort. If the pain lasts for more than 15 minutes, stop the exercises until you see your doctor. Improvement with exercise for back problems is slow.

## 2012-08-07 NOTE — Progress Notes (Signed)
Diabetes Self-Management Training (DSMT)  Initial Visit  08/07/2012 Mr. Neilan Rizzo, identified by name and date of birth, is a 51 y.o. male with Type 2 Diabetes. Year of diabetes diagnosis: 2003  ASSESSMENT Patient concerns are Glycemic control.and back and leg pain  There were no vitals taken for this visit. There is no height or weight on file to calculate BMI. Lab Results  Component Value Date   LDLCALC 77 04/28/2012   Lab Results  Component Value Date   HGBA1C 7.6 07/21/2012     Labs reviewed.  Family history of diabetes: Yes Support systems: significant other Special needs: Simplified materials, Verbal instruction Patients belief/attitude about diabetes: Diabetes can be controlled. Self foot exams daily: Yes Diabetes Complications: Neuropathy Prior DM Education: Yes   Medications See Medications list.  Is interested in learning more   Exercise Plan Doing ADLs only right now due to back pain..   Self-Monitoring Monitor: going to health department today to get meter and strips Frequency of testing: not testing Breakfast: 212 today after breakfast Hyperglycemia: Yes Weekly Hypoglycemia: Says he has symptoms when blood sugar in 70s, 80s and 90s.    Meal Planning Some knowledge and Interested in improving   Assessment comments:  Encouraged patient to get tighter control of his blood sugar to help prevent the amputations that he is so fearful of. He does have some dietary modifications and suspect back pain is keeping him form being more active both of which may lower his blood sugars. Weight is close to normal limits. If 3-6 months of lifestyle change therapy do not achieve A1C< 7.) % would consider second agent to attain this goal. Could consider low dose glipizide ER  Vs lantus   INDIVIDUAL DIABETES EDUCATION PLAN:  Diabetes disease state Nutrition management Monitoring Chronic  complications _______________________________________________________________________  Intervention TOPICS COVERED TODAY:  Diabetes disease state Definition of diabetes, type 1 and 2, and the diagnosis of diabetes. Nutrition management  Role of diet in the treatment of diabetes and the relationship between the three main macronutritents and blood glucose control. Reviewed blood glucose goals for pre and post meals and how to evaluate the patients' food intake on their blood glucose level. Information on hints to eating out and maintain blood glucose control. Meal options for control of blood glucose level and chronic complications. Monitoring  Purpose and frequency of SMBG. Taught/discussed recording of test results and interpretation of SMBG. Chronic complications  Assessed and discussed foot care and prevention of foot problems  PATIENTS GOALS/PLAN (copy and paste in patient instructions so patient receives a copy): 1.  Learning Objective:       State what is in a balanced meal 2.  Behavioral Objective:         Monitoring: To identify blood glucose trends, I will bring meter to next clinic visit Sometimes 25%  Personalized Follow-Up Plan for Ongoing Self Management Support:  Doctor's Office, friends, family and CDE visits ______________________________________________________________________   Outcomes Expected outcomes: Demonstrated interest in learning.Expect positive changes in lifestyle. Self-care Barriers: Lack of material resources Education material provided: yes Patient to contact team via Phone if problems or questions. Time in: 0930     Time out: 1030 Future DSMT - 4-6 wks   Cynia Abruzzo, Lupita Leash

## 2012-08-08 ENCOUNTER — Telehealth: Payer: Self-pay | Admitting: Dietician

## 2012-08-08 DIAGNOSIS — E119 Type 2 diabetes mellitus without complications: Secondary | ICD-10-CM

## 2012-08-08 MED ORDER — GLUCOSE BLOOD VI STRP: ORAL_STRIP | Status: DC

## 2012-08-08 MED ORDER — BLOOD GLUCOSE METER KIT: PACK | Status: AC

## 2012-08-27 ENCOUNTER — Other Ambulatory Visit: Payer: Self-pay | Admitting: *Deleted

## 2012-08-27 DIAGNOSIS — M549 Dorsalgia, unspecified: Secondary | ICD-10-CM

## 2012-08-27 MED ORDER — TRAMADOL HCL 50 MG PO TABS: 50.0000 mg | ORAL_TABLET | Freq: Four times a day (QID) | ORAL | Status: AC | PRN

## 2012-08-27 NOTE — Telephone Encounter (Signed)
[facility] pharm needs directions for Tramadol HCL 50mg  #30  Given Dr Gwenlyn Saran and directions - use as directed. Was written 08/26/12 and lost Rx 07/30/12.

## 2012-09-16 ENCOUNTER — Encounter: Payer: Self-pay | Admitting: Physical Medicine & Rehabilitation

## 2012-09-16 ENCOUNTER — Encounter: Payer: Self-pay | Attending: Physical Medicine & Rehabilitation | Admitting: Physical Medicine & Rehabilitation

## 2012-09-16 VITALS — BP 150/88 | HR 88 | Resp 14 | Ht 69.0 in | Wt 185.6 lb

## 2012-09-16 DIAGNOSIS — G609 Hereditary and idiopathic neuropathy, unspecified: Secondary | ICD-10-CM | POA: Insufficient documentation

## 2012-09-16 DIAGNOSIS — IMO0002 Reserved for concepts with insufficient information to code with codable children: Secondary | ICD-10-CM | POA: Insufficient documentation

## 2012-09-16 DIAGNOSIS — G8929 Other chronic pain: Secondary | ICD-10-CM

## 2012-09-16 NOTE — Patient Instructions (Signed)
Call us with problems or questions.

## 2012-09-16 NOTE — Progress Notes (Signed)
EMG/NCS Lab   NCS: A lower extremity nerve conduction screen was performed testing bilateral posterior tibial, peroneal, and sural nerves. Motor nerve testing revealed potentials which were within the low end of normal range (40-70m/s) with low normal to normal ampltiudes. Wave form morphology was generally preserved. Sural sensory potentials were tested and could not be found on either leg.  EMG:  I examined the L3,L4,L5, S1 paraspinals as well as the quadriceps, hamstrings, gastrocs, gluteus maximus, tibialis anterior, and EHL on both sides, using a 1 inch 28g concentric needle. Insertional and spontaneous activity was increased (1+) at both tib anteriors and EHL's and to a lesser extent at the gastrocs. I also noticed increased insertional and spontaneous activty at the L5 and S1 paraspinal muscles left more than right. The L4 level revealed minimal increase in insertional and spontaneous activity and I saw no activity at the L3 level. There was reduced recruitment and occasional polyphasia noted at the tib anterior muscles and gastrocs bilaterally. Recruitment also appeared decreased at the quadriceps but his effort was poor there due to pain. Gluteus maximus testing was fairly unremarkable. Hamstring testing was poorly tolerated.  Conclusion: 1. 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 Levels which appear to most affected are L5 and S1. May have involvement to a lesser extent of L4 ans S2 as well.  2. Peripheral sensory-motor polyneuropathy, affecting the sensory nerves more so at this point, consistent with distal diabetic polyneuropathy   Ivory Broad, MD

## 2012-09-18 ENCOUNTER — Encounter: Payer: Self-pay | Admitting: Physical Medicine & Rehabilitation

## 2012-09-19 NOTE — Telephone Encounter (Signed)
Patient requested meter and strips form [facility].

## 2012-09-23 ENCOUNTER — Ambulatory Visit (INDEPENDENT_AMBULATORY_CARE_PROVIDER_SITE_OTHER): Payer: Self-pay | Admitting: Internal Medicine

## 2012-09-23 ENCOUNTER — Encounter: Payer: Self-pay | Admitting: Internal Medicine

## 2012-09-23 VITALS — BP 127/81 | HR 81 | Temp 97.1°F | Ht 69.0 in | Wt 186.0 lb

## 2012-09-23 DIAGNOSIS — E119 Type 2 diabetes mellitus without complications: Secondary | ICD-10-CM

## 2012-09-23 DIAGNOSIS — J019 Acute sinusitis, unspecified: Secondary | ICD-10-CM

## 2012-09-23 DIAGNOSIS — R51 Headache: Secondary | ICD-10-CM

## 2012-09-23 DIAGNOSIS — I10 Essential (primary) hypertension: Secondary | ICD-10-CM

## 2012-09-23 DIAGNOSIS — R519 Headache, unspecified: Secondary | ICD-10-CM | POA: Insufficient documentation

## 2012-09-23 MED ORDER — AMOXICILLIN 500 MG PO CAPS: 500.0000 mg | ORAL_CAPSULE | Freq: Two times a day (BID) | ORAL | Status: AC

## 2012-09-23 MED ORDER — CHLORPHENIRAMINE MALEATE 4 MG PO TABS: 4.0000 mg | ORAL_TABLET | Freq: Two times a day (BID) | ORAL | Status: DC | PRN

## 2012-09-23 MED ORDER — FLUTICASONE PROPIONATE 50 MCG/ACT NA SUSP: 2.0000 | Freq: Every day | NASAL | Status: DC

## 2012-09-23 NOTE — Patient Instructions (Signed)
-  Please take amoxicillin 500mg  twice daily for 7 days.  You may also take chlorphenriamine 4mg  twice daily as needed - this medication will help dry out your sinuses.  Also, start taking fluticasone nasal spray - 2 sprays in one nostril daily.  Please be sure to bring all of your medications with you to every visit.  Should you have any new or worsening symptoms, please be sure to call the clinic at [phone].

## 2012-09-23 NOTE — Progress Notes (Signed)
Subjective:   [name] male   DOB: 1961/11/01 51 y.o.   MRN: [id number]  HPI: [name] is a 51 y.o. man with h/o DM & HTN who presents for an acute visit for HA.  Diffuse HA and around eyes x 2-3 weeks.  No inciting incident/trauma.  Always in the mornings.  Wakes up around 2-3am sweating.  Tried not to take all his pills (except DM meds) to see if he feels better, but no relief.  Eventually goes away if he keeps moving.  10/10 in the morning, described as throbbing.  Feels nauseated. Numbness on b/l cheeks.  In the past, usually occurs for 2-3 days and then dissipates.  Tried tylenol without relief.  Notes worsening vision but better with glasses.  No h/o coffee but notes pepsi use - usually with lunch & dinner. Smokes 1 cigar per week.  Not related to food/CBGs. Notes some nasal congestion - 2-3x/day for the last 2-3 weeks.  +Dizziness difficult to describe.  Used to be bad when he lived in [place].   No fevers.  No cough.  No sore throat.    Past Medical History  Diagnosis Date  . Diabetes mellitus   . Neuromuscular disorder   . Pancreatitis 01/09/2012  . Hypertension goal BP (blood pressure) < 140/80    Current Outpatient Prescriptions  Medication Sig Dispense Refill  . Blood Glucose Monitoring Suppl (BLOOD GLUCOSE METER) kit Use up to 1x/day dx code 250.00  1 each  0  . cyclobenzaprine (FLEXERIL) 5 MG tablet Take 5 mg by mouth 3 (three) times daily as needed.      . gabapentin (NEURONTIN) 300 MG capsule Take 2 capsules (600 mg total) by mouth 3 (three) times daily.  90 capsule  3  . glucose blood (WAVESENSE PRESTO) test strip Use up to 1x/day dx code 250.00  100 each  12  . lisinopril (PRINIVIL,ZESTRIL) 5 MG tablet Take 1 tablet (5 mg total) by mouth daily.  30 tablet  3  . meloxicam (MOBIC) 7.5 MG tablet Take 1 pill in the morning for pain.  You may take a second pill if needed.  60 tablet  1  . metFORMIN (GLUCOPHAGE) 1000 MG tablet Take 1 tablet by mouth twice  daily.  60 tablet  3  . omeprazole (PRILOSEC) 20 MG capsule Take 1 capsule (20 mg total) by mouth daily.  30 capsule  3   No family history on file. History   Social History  . Marital Status: Single    Spouse Name: N/A    Number of Children: N/A  . Years of Education: N/A   Social History Main Topics  . Smoking status: Current Some Day Smoker -- 0.1 packs/day    Types: Cigarettes    Last Attempt to Quit: 01/05/2012  . Smokeless tobacco: Never Used  . Alcohol Use: 3.0 oz/week    5 Cans of beer per week  . Drug Use: No  . Sexually Active: Yes    Birth Control/ Protection: None   Other Topics Concern  . None   Social History Narrative  . None   Review of Systems: Constitutional: Denies fever, chills, diaphoresis, appetite change and fatigue.  HEENT: Denies photophobia, redness, hearing loss, ear pain, sore throat, sneezing, mouth sores, trouble swallowing, neck pain, neck stiffness and tinnitus.   Respiratory: Denies SOB, DOE, cough, chest tightness,  and wheezing.   Cardiovascular: Denies chest pain, palpitations and leg swelling.  Gastrointestinal: Denies nausea, vomiting, abdominal pain,  diarrhea, constipation, blood in stool and abdominal distention.  Genitourinary: Denies dysuria, urgency, frequency, hematuria, flank pain and difficulty urinating.   Skin: Denies pallor, rash and wound.  Neurological: Denies seizures, syncope, weakness, light-headedness.  Psychiatric/Behavioral: Denies suicidal ideation, mood changes, confusion, nervousness, sleep disturbance and agitation  Objective:  Physical Exam: Filed Vitals:   09/23/12 0905  BP: 127/81  Pulse: 81  Temp: 97.1 F (36.2 C)  TempSrc: Oral  Height: 5\' 9"  (1.753 m)  Weight: 186 lb (84.369 kg)  SpO2: 98%   Constitutional: Vital signs reviewed.  Patient is a well-developed and well-nourished man in no acute distress and cooperative with exam.  Head: Normocephalic and atraumatic, tender to b/l frontal and  maxillary sinuses Ear: TM normal bilaterally Mouth: no erythema or exudates, MMM Eyes: PERRL, EOMI, conjunctivae normal, No scleral icterus.   Cardiovascular: RRR, S1 normal, S2 normal, no MRG, pulses symmetric and intact bilaterally Pulmonary/Chest: CTAB, no wheezes, rales, or rhonchi Abdominal: Soft. Non-tender, non-distended, bowel sounds are normal, no masses, organomegaly, or guarding present.  Hematology: no cervical adenopathy.  Neurological: A&O x3, cranial nerve II-XII are grossly intact, no focal motor deficit, sensory intact to light touch bilaterally.  Skin: Warm, dry and intact. No rash, cyanosis, or clubbing.  Psychiatric: Normal mood and affect. speech and behavior is normal. Judgment and thought content normal. Cognition and memory are normal.    Assessment & Plan:   Case and care discussed with Dr. Josem Kaufmann. Please see problem oriented charting for further details. Patient to return in 1 month for follow up with PCP or return sooner if symptoms worsen or fail to improve.

## 2012-09-23 NOTE — Assessment & Plan Note (Signed)
Lab Results  Component Value Date   NA 137 08/07/2012   K 4.4 08/07/2012   CL 103 08/07/2012   CO2 26 08/07/2012   BUN 16 08/07/2012   CREATININE 1.06 08/07/2012   CREATININE 0.78 01/10/2012    BP Readings from Last 3 Encounters:  09/23/12 127/81  09/16/12 150/88  08/04/12 124/85    Assessment: Hypertension control:  controlled  Progress toward goals:  at goal Barriers to meeting goals:  no barriers identified  Plan: Hypertension treatment:  continue current medications - lisinopril 5

## 2012-09-23 NOTE — Assessment & Plan Note (Addendum)
HA most likely d/t Acute sinusitis d/t viral vs bacterial infection vs allergies.  Given duration of symptoms (>2 weeks) would benefit from antibiotic therapy.  If no response within 1-1.5weeks, consider etiology to be migraines vs caffeine withdrawel headache vs medication overuse headache.  No neuro sx on physical exam, so not likely much utility for imaging at this point.  Does not seem to be related to food/hypoglycemia.  May consider sinus CT if persists.  Trial of Amoxicillin 500 BID x 7d, chlorphenriamine BID prn and flonase  RTC if no improvement in 2 weeks

## 2012-10-20 ENCOUNTER — Ambulatory Visit: Payer: Self-pay | Admitting: Physical Medicine & Rehabilitation

## 2012-10-22 ENCOUNTER — Ambulatory Visit: Payer: Self-pay | Admitting: Physical Medicine & Rehabilitation

## 2012-10-27 ENCOUNTER — Encounter: Payer: Self-pay | Admitting: Internal Medicine

## 2012-10-27 ENCOUNTER — Ambulatory Visit (INDEPENDENT_AMBULATORY_CARE_PROVIDER_SITE_OTHER): Payer: Self-pay | Admitting: Internal Medicine

## 2012-10-27 VITALS — BP 138/87 | HR 86 | Temp 97.9°F | Ht 69.0 in | Wt 181.3 lb

## 2012-10-27 DIAGNOSIS — G8929 Other chronic pain: Secondary | ICD-10-CM

## 2012-10-27 DIAGNOSIS — Z79899 Other long term (current) drug therapy: Secondary | ICD-10-CM

## 2012-10-27 DIAGNOSIS — I10 Essential (primary) hypertension: Secondary | ICD-10-CM

## 2012-10-27 DIAGNOSIS — E119 Type 2 diabetes mellitus without complications: Secondary | ICD-10-CM

## 2012-10-27 DIAGNOSIS — IMO0002 Reserved for concepts with insufficient information to code with codable children: Secondary | ICD-10-CM

## 2012-10-27 DIAGNOSIS — B1920 Unspecified viral hepatitis C without hepatic coma: Secondary | ICD-10-CM

## 2012-10-27 DIAGNOSIS — E1140 Type 2 diabetes mellitus with diabetic neuropathy, unspecified: Secondary | ICD-10-CM

## 2012-10-27 DIAGNOSIS — Z23 Encounter for immunization: Secondary | ICD-10-CM

## 2012-10-27 LAB — POCT GLYCOSYLATED HEMOGLOBIN (HGB A1C): Hemoglobin A1C: 7

## 2012-10-27 MED ORDER — PREGABALIN 50 MG PO CAPS: 50.0000 mg | ORAL_CAPSULE | Freq: Three times a day (TID) | ORAL | Status: DC

## 2012-10-27 NOTE — Assessment & Plan Note (Signed)
Lab Results  Component Value Date   NA 137 08/07/2012   K 4.4 08/07/2012   CL 103 08/07/2012   CO2 26 08/07/2012   BUN 16 08/07/2012   CREATININE 1.06 08/07/2012   CREATININE 0.78 01/10/2012    BP Readings from Last 3 Encounters:  10/27/12 138/87  09/23/12 127/81  09/16/12 150/88    Assessment: Hypertension control:  mildly elevated  Progress toward goals:  deteriorated Barriers to meeting goals:  no barriers identified  Plan: Hypertension treatment:  continue current medications. If his blood pressure continues to be elevated at future clinic appointments would consider increasing the dose of lisinopril.

## 2012-10-27 NOTE — Assessment & Plan Note (Signed)
He continues to complain of low back pain radiating to both the legs( right worse than left). 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 He is taking 600 mg of Neurontin every day, instead of 600 mg 3 times a day. He has not followed up with physical therapy as he did not have his orange card renewed with his last clinic appointment with the PCP. -Advised him increase the dose of Neurontin from 600 mg daily to 600 mg 3 times a day. -Start him on Lyrica to see if it helps in better control of symptoms. -Reschedule physical therapy referral

## 2012-10-27 NOTE — Patient Instructions (Addendum)
Please schedule a follow up appointment in  3 months or sooner if needed . Please bring your medication bottles with your next appointment. Please take your medicines as prescribed.  

## 2012-10-27 NOTE — Assessment & Plan Note (Signed)
Lab Results  Component Value Date   HGBA1C 7.0 10/27/2012   HGBA1C 7.6* 01/10/2012   CREATININE 1.06 08/07/2012   CREATININE 0.78 01/10/2012   MICROALBUR 0.86 01/30/2012   MICRALBCREAT 4.5 01/30/2012   CHOL 184 04/28/2012   HDL 40 04/28/2012   TRIG 335* 04/28/2012    Last eye exam and foot exam: No results found for this basename: HMDIABEYEEXA, HMDIABFOOTEX    Assessment: States that he started having diarrhea when he started taking 1000 milligram pills that he brought from the [employer]. He states that he was not having this problem when he was taking 2 pills of 500 mg( 1000 mg in total). For last one week, he has just been taking 500 mg twice a day. It seems like maybe the genericl versus non-generic medication might be causing him this problem. Diabetes control: controlled Progress toward goals: at goal Barriers to meeting goals: no barriers identified  Plan: Advised him to take 1000 milligrams dose in total of metformin. Ophthalmology referral was placed Diabetes treatment: continue current medications.  Refer to: none Instruction/counseling given: reminded to get eye exam, reminded to bring blood glucose meter & log to each visit, reminded to bring medications to each visit, discussed foot care and discussed the need for weight loss

## 2012-10-27 NOTE — Assessment & Plan Note (Addendum)
Got a hepatitis B shot today- completing his course for the vaccine. He was referred to hepatitis clinic-he was given the choice of hepatitis C clinic in [place] versus [facility]. He was told that they do not accept orange card and he might have to pay out of his pocket. He choses over [facility] because he could not drive to [facility]. - Refer to hepatology clinic

## 2012-10-27 NOTE — Progress Notes (Signed)
Subjective:   [name] male   DOB: September 12, 1961 51 y.o.   MRN: [id number]  HPI: 51 year old man with past medical history significant for type 2 diabetes mellitus, neuropathy , hypertension comes to the clinic a followup visit   DM: He is having problems with diarrhea, so he has cut down his metformin from 1000 to 500 for last 2 weeks. Although he has been taking metformin for last 6 months , but states that has to go use the bathroom 3-4 times for last 1 month since he started taking metformin from the [employer] brand. He claims that he is not having problems with diarrhea now when he is taking 500 mg left over tabs. States that his CBG's run all over. Denies any hypoglycemic events.   Back pain with radiculopathy: Continues to report back pain radiating to the legs( right worse than left). Has not started physical therapy- states that he was waiting for [place] to call. Requesting to start him on Lyrica as he has seen on the TV and talked to his friends that got benefit from that. He takes neuron tin 600 mg a day although he was advised to take 600 mg TID. He recently underwent NCV and EMG - the results are consistent with mild lumbosacral radiculopathy likely related to his diabetes.   Past Medical History  Diagnosis Date  . Diabetes mellitus   . Neuromuscular disorder   . Pancreatitis 01/09/2012  . Hypertension goal BP (blood pressure) < 140/80    No family history on file. History   Social History  . Marital Status: Single    Spouse Name: N/A    Number of Children: N/A  . Years of Education: N/A   Occupational History  . Not on file.   Social History Main Topics  . Smoking status: Current Some Day Smoker -- 0.1 packs/day    Types: Cigarettes    Last Attempt to Quit: 01/05/2012  . Smokeless tobacco: Never Used  . Alcohol Use: 3.0 oz/week    5 Cans of beer per week  . Drug Use: No  . Sexually Active: Yes    Birth Control/ Protection: None   Other Topics Concern  . Not on  file   Social History Narrative  . No narrative on file   Review of Systems: General: Denies fever, chills, diaphoresis, appetite change and fatigue. HEENT: Denies photophobia, eye pain, redness, hearing loss, ear pain, congestion, sore throat, rhinorrhea, sneezing, mouth sores, trouble swallowing, neck pain, neck stiffness and tinnitus. Respiratory: Denies SOB, DOE, cough, chest tightness, and wheezing. Cardiovascular: Denies to chest pain, palpitations and leg swelling. Gastrointestinal: Denies nausea, vomiting, abdominal pain, diarrhea, constipation, blood in stool and abdominal distention. Genitourinary: Denies dysuria, urgency, frequency, hematuria, flank pain and difficulty urinating. Musculoskeletal: Denies myalgias, back pain, joint swelling, arthralgias and gait problem.  Skin: Denies pallor, rash and wound. Neurological: Denies dizziness, seizures, syncope, weakness, light-headedness, numbness and headaches. Hematological: Denies adenopathy, easy bruising, personal or family bleeding history. Psychiatric/Behavioral: Denies suicidal ideation, mood changes, confusion, nervousness, sleep disturbance and agitation.    Current Outpatient Medications: Current Outpatient Prescriptions  Medication Sig Dispense Refill  . Blood Glucose Monitoring Suppl (BLOOD GLUCOSE METER) kit Use up to 1x/day dx code 250.00  1 each  0  . chlorpheniramine (CHLOR-TRIMETON) 4 MG tablet Take 1 tablet (4 mg total) by mouth 2 (two) times daily as needed for allergies.  14 tablet  0  . cyclobenzaprine (FLEXERIL) 5 MG tablet Take 5 mg by mouth  3 (three) times daily as needed.      . fluticasone (FLONASE) 50 MCG/ACT nasal spray Place 2 sprays into the nose daily.  16 g  0  . gabapentin (NEURONTIN) 300 MG capsule Take 2 capsules (600 mg total) by mouth 3 (three) times daily.  90 capsule  3  . glucose blood (WAVESENSE PRESTO) test strip Use up to 1x/day dx code 250.00  100 each  12  . lisinopril  (PRINIVIL,ZESTRIL) 5 MG tablet Take 1 tablet (5 mg total) by mouth daily.  30 tablet  3  . meloxicam (MOBIC) 7.5 MG tablet Take 1 pill in the morning for pain.  You may take a second pill if needed.  60 tablet  1  . metFORMIN (GLUCOPHAGE) 1000 MG tablet Take 1 tablet by mouth twice daily.  60 tablet  3  . omeprazole (PRILOSEC) 20 MG capsule Take 1 capsule (20 mg total) by mouth daily.  30 capsule  3    Allergies: No Known Allergies    Objective:   Physical Exam: Filed Vitals:   10/27/12 1445  BP: 138/87  Pulse: 86  Temp: 97.9 F (36.6 C)    General: Vital signs reviewed and noted. Well-developed, well-nourished, in no acute distress; alert, appropriate and cooperative throughout examination. Head: Normocephalic, atraumatic Lungs: Normal respiratory effort. Clear to auscultation BL without crackles or wheezes. Heart: RRR. S1 and S2 normal without gallop, murmur, or rubs. Abdomen:BS normoactive. Soft, Nondistended, non-tender.  No masses or organomegaly. Extremities: No pretibial edema.     Assessment & Plan:

## 2012-11-05 MED ORDER — GLUCOSE BLOOD VI STRP: ORAL_STRIP | Status: DC

## 2012-11-05 NOTE — Addendum Note (Signed)
Addended by: Elyse Jarvis on: 11/05/2012 02:14 PM   Modules accepted: Orders

## 2012-11-18 ENCOUNTER — Other Ambulatory Visit: Payer: Self-pay | Admitting: *Deleted

## 2012-11-18 MED ORDER — LISINOPRIL 5 MG PO TABS: 5.0000 mg | ORAL_TABLET | Freq: Every day | ORAL | Status: DC

## 2012-11-18 NOTE — Telephone Encounter (Signed)
rx faxed in 

## 2012-12-08 ENCOUNTER — Ambulatory Visit: Payer: Self-pay

## 2012-12-08 ENCOUNTER — Ambulatory Visit (INDEPENDENT_AMBULATORY_CARE_PROVIDER_SITE_OTHER): Payer: No Typology Code available for payment source | Admitting: Internal Medicine

## 2012-12-08 ENCOUNTER — Encounter: Payer: Self-pay | Admitting: Internal Medicine

## 2012-12-08 VITALS — BP 130/85 | HR 77 | Temp 97.8°F | Ht 69.0 in | Wt 184.0 lb

## 2012-12-08 DIAGNOSIS — Z23 Encounter for immunization: Secondary | ICD-10-CM

## 2012-12-08 DIAGNOSIS — Z0000 Encounter for general adult medical examination without abnormal findings: Secondary | ICD-10-CM | POA: Insufficient documentation

## 2012-12-08 DIAGNOSIS — IMO0002 Reserved for concepts with insufficient information to code with codable children: Secondary | ICD-10-CM

## 2012-12-08 DIAGNOSIS — M549 Dorsalgia, unspecified: Secondary | ICD-10-CM

## 2012-12-08 DIAGNOSIS — E119 Type 2 diabetes mellitus without complications: Secondary | ICD-10-CM

## 2012-12-08 DIAGNOSIS — G8929 Other chronic pain: Secondary | ICD-10-CM

## 2012-12-08 DIAGNOSIS — Z01810 Encounter for preprocedural cardiovascular examination: Secondary | ICD-10-CM | POA: Insufficient documentation

## 2012-12-08 MED ORDER — KETOROLAC TROMETHAMINE 30 MG/ML IJ SOLN
30.0000 mg | Freq: Once | INTRAMUSCULAR | Status: DC
  Administered 2012-12-08: 30 mg via INTRAVENOUS

## 2012-12-08 MED ORDER — KETOROLAC TROMETHAMINE 30 MG/ML IM SOLN: 30.0000 mg | Freq: Once | INTRAMUSCULAR | Status: DC

## 2012-12-08 NOTE — Progress Notes (Signed)
Subjective:   [name] male   DOB: 1961-07-27 51 y.o.   MRN: [id number]  HPI: 51 year old man with past medical history significant for type 2 diabetes mellitus, hypertension, hepatitis C, chronic right-sided low back pain comes to the clinic for a followup visit.  He reports worsening of his chronic low back pain for last 51 month. His pain is located in the lower back (right worse than left) radiating all the way to the sole of the foot associated tingling and numbness. He currently rates his pain 9/10. He states that he was doing fine in the interim between last clinic visit and today but had return of pain for last 3-4 weeks . No inciting event known. He could not afford Lyrica, so he never took it. He has not been able to work with the physical therapist thus far because he did not receive a call from them. He is requesting a pain shot to help with his pain. He was here to see Rudell Cobb to renew his orange card and was about to go to the ED after that but then, he decided to be seen in the clinic. I am not sure of excatly how much neurontin is he taking( intially he told 1 tablet once a day and then 1 tablet twice a day) He had work up including EMG and NCV with results consistent with lumbosacral radiculopathy that could be related to diabetic amyotrophy. MRI has shown mild degenerative disease.    Past Medical History  Diagnosis Date  . Diabetes mellitus   . Neuromuscular disorder   . Pancreatitis 01/09/2012  . Hypertension goal BP (blood pressure) < 140/80    No family history on file. History   Social History  . Marital Status: Single    Spouse Name: N/A    Number of Children: N/A  . Years of Education: N/A   Occupational History  . Not on file.   Social History Main Topics  . Smoking status: Current Some Day Smoker -- 0.1 packs/day    Types: Cigarettes    Last Attempt to Quit: 01/05/2012  . Smokeless tobacco: Never Used  . Alcohol Use: 3.0 oz/week    5 Cans of  beer per week  . Drug Use: No  . Sexually Active: Yes    Birth Control/ Protection: None   Other Topics Concern  . Not on file   Social History Narrative  . No narrative on file   Review of Systems: General: Denies fever, chills, diaphoresis, appetite change and fatigue. HEENT: Denies photophobia, eye pain, redness, hearing loss, ear pain, congestion, sore throat, rhinorrhea, sneezing, mouth sores, trouble swallowing, neck pain, neck stiffness and tinnitus. Respiratory: Denies SOB, DOE, cough, chest tightness, and wheezing. Cardiovascular: Denies to chest pain, palpitations and leg swelling. Gastrointestinal: Denies nausea, vomiting, abdominal pain, diarrhea, constipation, blood in stool and abdominal distention. Genitourinary: Denies dysuria, urgency, frequency, hematuria, flank pain and difficulty urinating. Musculoskeletal: Denies myalgias, joint swelling, arthralgias and gait problem,  + back pain,.  Skin: Denies pallor, rash and wound. Neurological: Denies dizziness, seizures, syncope, weakness, light-headedness, numbness and headaches. Hematological: Denies adenopathy, easy bruising, personal or family bleeding history. Psychiatric/Behavioral: Denies suicidal ideation, mood changes, confusion, nervousness, sleep disturbance and agitation.    Current Outpatient Medications: Current Outpatient Prescriptions  Medication Sig Dispense Refill  . Blood Glucose Monitoring Suppl (BLOOD GLUCOSE METER) kit Use up to 1x/day dx code 250.00  1 each  0  . chlorpheniramine (CHLOR-TRIMETON) 4 MG tablet Take 1  tablet (4 mg total) by mouth 2 (two) times daily as needed for allergies.  14 tablet  0  . cyclobenzaprine (FLEXERIL) 5 MG tablet Take 5 mg by mouth 3 (three) times daily as needed.      . fluticasone (FLONASE) 50 MCG/ACT nasal spray Place 2 sprays into the nose daily.  16 g  0  . gabapentin (NEURONTIN) 300 MG capsule Take 2 capsules (600 mg total) by mouth 3 (three) times daily.  90  capsule  3  . glucose blood (WAVESENSE PRESTO) test strip Use up to 1x/day dx code 250.00  100 each  12  . glucose blood test strip Use to check blood sugar up to 3 times a day before meals Dx code- 250.00  100 each  12  . lisinopril (PRINIVIL,ZESTRIL) 5 MG tablet Take 1 tablet (5 mg total) by mouth daily.  30 tablet  3  . meloxicam (MOBIC) 7.5 MG tablet Take 1 pill in the morning for pain.  You may take a second pill if needed.  60 tablet  1  . metFORMIN (GLUCOPHAGE) 1000 MG tablet Take 1 tablet by mouth twice daily.  60 tablet  3  . omeprazole (PRILOSEC) 20 MG capsule Take 1 capsule (20 mg total) by mouth daily.  30 capsule  3  . pregabalin (LYRICA) 50 MG capsule Take 1 capsule (50 mg total) by mouth 3 (three) times daily.  90 capsule  0    Allergies: No Known Allergies    Objective:   Physical Exam: Filed Vitals:   12/08/12 1019  BP: 130/85  Pulse: 77  Temp: 97.8 F (36.6 C)    General: Vital signs reviewed and noted. Well-developed, well-nourished, in no acute distress; alert, appropriate and cooperative throughout examination. Head: Normocephalic, atraumatic Lungs: Normal respiratory effort. Clear to auscultation BL without crackles or wheezes. Heart: RRR. S1 and S2 normal without gallop, murmur, or rubs. Abdomen:BS normoactive. Soft, Nondistended, non-tender.  No masses or organomegaly. Extremities: No pretibial edema. Musculoskeletal; Right SLRT Positive      Assessment & Plan:

## 2012-12-08 NOTE — Assessment & Plan Note (Signed)
Presents to the clinic for worsening of his chronic low back pain( right worse than left) secondary to lumbosacral radiculopathy. He has not heard back from physical therapy yet. I am not sure exactly how much dose of Neurontin is he taking ( he initially told me 1 tablet once a day and then told me 1 tablet twice a day. He did not bring up his medication bottles with him. -Toradol shot -Continue neurontin ( 600 mg TID) and PRN meloxicam. He was again emphasized to take higher dose of Neurontin to see if that helps him and bring medication bottles with next appointment. -Work on his physical therapy referral.

## 2012-12-08 NOTE — Assessment & Plan Note (Signed)
He got his flu shot today 

## 2012-12-08 NOTE — Assessment & Plan Note (Addendum)
Fairly well-controlled. He had few CBG readings are reviewed and his highest sugar was 343 the rest of the values were ranging in the 150s to early 200s. Denies any hypo-glycemic events. He states that he no longer has diarrhea that he mentioned at previous visit with a particular brand of metformin. -He was advised to take 1000mg  of metformin twice a day. -Ophthalmology referral was placed but there were no spots available. We'll try to get him an appointment in January. He also needs to see Rudell Cobb for renewal of his orange card.

## 2012-12-08 NOTE — Patient Instructions (Addendum)
General Instructions: Please schedule a follow up appointment in 2 months . Please bring your medication bottles with your next appointment. Please take your medicines as prescribed.     Treatment Goals:  Goals (1 Years of Data) as of 12/08/2012          As of Today 10/27/12 09/23/12 09/16/12 08/04/12     Blood Pressure    . Blood Pressure < 140/90  130/85 138/87 127/81 150/88 124/85    . Blood Pressure < 140/90  130/85 138/87 127/81 150/88 124/85     Result Component    . HEMOGLOBIN A1C < 6.5   7.0       . HEMOGLOBIN A1C < 7.0   7.0       . LDL CALC < 100          . LDL CALC < 100            Progress Toward Treatment Goals:  Treatment Goal 12/08/2012  Hemoglobin A1C at goal  Blood pressure at goal  Stop smoking smoking the same amount    Self Care Goals & Plans:  Self Care Goal 12/08/2012  Eat healthy foods drink diet soda or water instead of juice or soda; eat more vegetables  Stop smoking call [facility] (1-800-QUIT-NOW)    Home Blood Glucose Monitoring 12/08/2012  Check my blood sugar 2 times a day     Care Management & Community Referrals:  Referral 12/08/2012  Referrals made for care management support none needed

## 2012-12-24 ENCOUNTER — Telehealth: Payer: Self-pay | Admitting: *Deleted

## 2012-12-24 DIAGNOSIS — M79604 Pain in right leg: Secondary | ICD-10-CM

## 2012-12-24 MED ORDER — TRAMADOL HCL 50 MG PO TABS: 50.0000 mg | ORAL_TABLET | Freq: Four times a day (QID) | ORAL | Status: DC | PRN

## 2012-12-24 NOTE — Telephone Encounter (Signed)
Pt stopped by clinic and needs a refill Tramadol  50mg  #40 - filled 07/29/12. [facility]. [facility] Julizza Sassone RN 12/24/12 10AM

## 2012-12-30 ENCOUNTER — Ambulatory Visit
Payer: No Typology Code available for payment source | Attending: Internal Medicine | Admitting: Rehabilitative and Restorative Service Providers"

## 2012-12-30 DIAGNOSIS — M545 Low back pain, unspecified: Secondary | ICD-10-CM | POA: Insufficient documentation

## 2012-12-30 DIAGNOSIS — M2569 Stiffness of other specified joint, not elsewhere classified: Secondary | ICD-10-CM | POA: Insufficient documentation

## 2012-12-30 DIAGNOSIS — IMO0001 Reserved for inherently not codable concepts without codable children: Secondary | ICD-10-CM | POA: Insufficient documentation

## 2013-01-02 ENCOUNTER — Telehealth: Payer: Self-pay | Admitting: *Deleted

## 2013-01-02 ENCOUNTER — Ambulatory Visit: Payer: No Typology Code available for payment source | Admitting: Rehabilitation

## 2013-01-02 NOTE — Telephone Encounter (Signed)
Pt has refused Hep C referral - Donald Palmer following up to see if he is refusing [facility] clinic but not [facility] clinic, which will reportedly honor his [facility] card.

## 2013-01-06 ENCOUNTER — Ambulatory Visit: Payer: No Typology Code available for payment source | Admitting: Rehabilitation

## 2013-01-08 ENCOUNTER — Ambulatory Visit: Payer: No Typology Code available for payment source | Admitting: Rehabilitative and Restorative Service Providers"

## 2013-01-12 ENCOUNTER — Ambulatory Visit: Payer: No Typology Code available for payment source | Admitting: Rehabilitation

## 2013-01-12 ENCOUNTER — Encounter: Payer: No Typology Code available for payment source | Admitting: Rehabilitation

## 2013-01-14 ENCOUNTER — Ambulatory Visit: Payer: No Typology Code available for payment source | Admitting: Rehabilitation

## 2013-01-19 ENCOUNTER — Ambulatory Visit (INDEPENDENT_AMBULATORY_CARE_PROVIDER_SITE_OTHER): Payer: No Typology Code available for payment source | Admitting: Internal Medicine

## 2013-01-19 ENCOUNTER — Ambulatory Visit
Payer: No Typology Code available for payment source | Attending: Internal Medicine | Admitting: Rehabilitative and Restorative Service Providers"

## 2013-01-19 VITALS — BP 122/85 | HR 80 | Temp 97.9°F | Ht 69.0 in | Wt 184.5 lb

## 2013-01-19 DIAGNOSIS — IMO0001 Reserved for inherently not codable concepts without codable children: Secondary | ICD-10-CM | POA: Insufficient documentation

## 2013-01-19 DIAGNOSIS — Z79899 Other long term (current) drug therapy: Secondary | ICD-10-CM

## 2013-01-19 DIAGNOSIS — M5416 Radiculopathy, lumbar region: Secondary | ICD-10-CM

## 2013-01-19 DIAGNOSIS — R37 Sexual dysfunction, unspecified: Secondary | ICD-10-CM

## 2013-01-19 DIAGNOSIS — M545 Low back pain, unspecified: Secondary | ICD-10-CM | POA: Insufficient documentation

## 2013-01-19 DIAGNOSIS — Z0000 Encounter for general adult medical examination without abnormal findings: Secondary | ICD-10-CM

## 2013-01-19 DIAGNOSIS — M2569 Stiffness of other specified joint, not elsewhere classified: Secondary | ICD-10-CM | POA: Insufficient documentation

## 2013-01-19 DIAGNOSIS — G8929 Other chronic pain: Secondary | ICD-10-CM

## 2013-01-19 DIAGNOSIS — B1920 Unspecified viral hepatitis C without hepatic coma: Secondary | ICD-10-CM

## 2013-01-19 DIAGNOSIS — E119 Type 2 diabetes mellitus without complications: Secondary | ICD-10-CM

## 2013-01-19 DIAGNOSIS — IMO0002 Reserved for concepts with insufficient information to code with codable children: Secondary | ICD-10-CM

## 2013-01-19 DIAGNOSIS — Z23 Encounter for immunization: Secondary | ICD-10-CM

## 2013-01-19 LAB — POCT GLYCOSYLATED HEMOGLOBIN (HGB A1C): Hemoglobin A1C: 7.3

## 2013-01-19 LAB — GLUCOSE, CAPILLARY: Glucose-Capillary: 100 mg/dL — ABNORMAL HIGH (ref 70–99)

## 2013-01-19 MED ORDER — SILDENAFIL CITRATE 50 MG PO TABS: 50.0000 mg | ORAL_TABLET | ORAL | Status: DC | PRN

## 2013-01-19 NOTE — Assessment & Plan Note (Signed)
His A1c is 7.3 today. He is taking 1500 mg of metformin as opposed to 2000 mg daily dose that was advised because of problems with diarrhea. Patient was offered to be started on a another medication but he is satisfied with his CBGs and would like to continue the same medication for now. -We may consider adding glipizide in future if his A1c deteriorates on metformin alone. -Would work on getting an ophthalmology appointment this month through [facility].

## 2013-01-19 NOTE — Progress Notes (Signed)
Subjective:   [name] male   DOB: 07-25-1961 52 y.o.   MRN: [id number]  HPI: 52 year old gentleman with past medical history significant for type 2 diabetes mellitus, chronic lumbosacral radiculopathy, Hep C comes to the clinic for followup visit.  Patient reports that his back pain has improved with physical therapy. He states that TENS unit is really helping him and was wondering if we could arrange that for him. He currently rates his pain 6/10. He almost has completed his physical therapy and was wondering if it needs to be extended. Still reports some pain in the posterior aspect of his legs but states that his back pain is much better.  DM: He has been checking his blood sugars regularly and was taking total thousand milligrams daily ( 500 mg twice a day) as opposed to the advised dose of 1000 mg twice a day that gave him episodes of diarrhea. He recently had self increased it to 1500 mg a day of total dose and states that that has really improved his blood sugars. He is not having any episodes of diarrhea with the dose of 1500 mg.  He is having problems with sexual dysfunction and was wondering if something can be prescribed.   Past Medical History  Diagnosis Date  . Diabetes mellitus   . Neuromuscular disorder   . Pancreatitis 01/09/2012  . Hypertension goal BP (blood pressure) < 140/80   . Hepatitis C     [id number]   No family history on file. History   Social History  . Marital Status: Single    Spouse Name: N/A    Number of Children: N/A  . Years of Education: N/A   Occupational History  . Not on file.   Social History Main Topics  . Smoking status: Current Some Day Smoker -- 0.1 packs/day    Types: Cigarettes    Last Attempt to Quit: 01/05/2012  . Smokeless tobacco: Never Used  . Alcohol Use: 3.0 oz/week    5 Cans of beer per week  . Drug Use: No  . Sexually Active: Yes    Birth Control/ Protection: None   Other Topics Concern  . Not on file    Social History Narrative  . No narrative on file   Review of Systems: General: Denies fever, chills, diaphoresis, appetite change and fatigue. HEENT: Denies photophobia, eye pain, redness, hearing loss, ear pain, congestion, sore throat, rhinorrhea, sneezing, mouth sores, trouble swallowing, neck pain, neck stiffness and tinnitus. Respiratory: Denies SOB, DOE, cough, chest tightness, and wheezing. Cardiovascular: Denies to chest pain, palpitations and leg swelling. Gastrointestinal: Denies nausea, vomiting, abdominal pain, diarrhea, constipation, blood in stool and abdominal distention. Genitourinary: Denies dysuria, urgency, frequency, hematuria, flank pain and difficulty urinating. Musculoskeletal: Denies myalgias, joint swelling, arthralgias and gait problem, + back pain ( improving with physical therapy /TENS unit) .  Skin: Denies pallor, rash and wound. Neurological: Denies dizziness, seizures, syncope, weakness, light-headedness, numbness and headaches. Hematological: Denies adenopathy, easy bruising, personal or family bleeding history. Psychiatric/Behavioral: Denies suicidal ideation, mood changes, confusion, nervousness, sleep disturbance and agitation.    Current Outpatient Medications: Current Outpatient Prescriptions  Medication Sig Dispense Refill  . Blood Glucose Monitoring Suppl (BLOOD GLUCOSE METER) kit Use up to 1x/day dx code 250.00  1 each  0  . chlorpheniramine (CHLOR-TRIMETON) 4 MG tablet Take 1 tablet (4 mg total) by mouth 2 (two) times daily as needed for allergies.  14 tablet  0  . cyclobenzaprine (FLEXERIL) 5 MG tablet  Take 5 mg by mouth 3 (three) times daily as needed.      . fluticasone (FLONASE) 50 MCG/ACT nasal spray Place 2 sprays into the nose daily.  16 g  0  . gabapentin (NEURONTIN) 300 MG capsule Take 2 capsules (600 mg total) by mouth 3 (three) times daily.  90 capsule  3  . glucose blood (WAVESENSE PRESTO) test strip Use up to 1x/day dx code 250.00   100 each  12  . glucose blood test strip Use to check blood sugar up to 3 times a day before meals Dx code- 250.00  100 each  12  . ketorolac (TORADOL) 30 MG/ML injection Inject 1 mL (30 mg total) into the muscle once.  1 mL  0  . lisinopril (PRINIVIL,ZESTRIL) 5 MG tablet Take 1 tablet (5 mg total) by mouth daily.  30 tablet  3  . meloxicam (MOBIC) 7.5 MG tablet Take 1 pill in the morning for pain.  You may take a second pill if needed.  60 tablet  1  . metFORMIN (GLUCOPHAGE) 1000 MG tablet Take 1 tablet by mouth twice daily.  60 tablet  3  . omeprazole (PRILOSEC) 20 MG capsule Take 1 capsule (20 mg total) by mouth daily.  30 capsule  3  . traMADol (ULTRAM) 50 MG tablet Take 1 tablet (50 mg total) by mouth every 6 (six) hours as needed for pain.  40 tablet  0    Allergies: No Known Allergies    Objective:   Physical Exam: Filed Vitals:   01/19/13 1441  BP: 122/85  Pulse: 80  Temp: 97.9 F (36.6 C)    General: Vital signs reviewed and noted. Well-developed, well-nourished, in no acute distress; alert, appropriate and cooperative throughout examination. Head: Normocephalic, atraumatic Lungs: Normal respiratory effort. Clear to auscultation BL without crackles or wheezes. Heart: RRR. S1 and S2 normal without gallop, murmur, or rubs. Abdomen:BS normoactive. Soft, Nondistended, non-tender.  No masses or organomegaly. Extremities: No pretibial edema.     Assessment & Plan:

## 2013-01-19 NOTE — Patient Instructions (Addendum)
General Instructions: Please schedule a follow up appointment in 3 months or sooner if needed . Please bring your medication bottles with your next appointment. Please take your medicines as prescribed. Please continue with physical therapy. I will follow up on the TENS unit approval.      Treatment Goals:  Goals (1 Years of Data) as of 01/19/2013          As of Today 12/08/12 10/27/12 09/23/12 09/16/12     Blood Pressure    . Blood Pressure < 140/90  122/85 130/85 138/87 127/81 150/88    . Blood Pressure < 140/90  122/85 130/85 138/87 127/81 150/88     Result Component    . HEMOGLOBIN A1C < 6.5  7.3  7.0      . HEMOGLOBIN A1C < 7.0  7.3  7.0      . LDL CALC < 100          . LDL CALC < 100            Progress Toward Treatment Goals:  Treatment Goal 01/19/2013  Hemoglobin A1C at goal  Blood pressure at goal  Stop smoking smoking less    Self Care Goals & Plans:  Self Care Goal 01/19/2013  Manage my medications take my medicines as prescribed; bring my medications to every visit  Monitor my health keep track of my weight  Eat healthy foods drink diet soda or water instead of juice or soda; eat more vegetables  Stop smoking call [facility] (1-800-QUIT-NOW)    Home Blood Glucose Monitoring 01/19/2013  Check my blood sugar 3 times a day  When to check my blood sugar before meals     Care Management & Community Referrals:  Referral 01/19/2013  Referrals made for care management support none needed

## 2013-01-19 NOTE — Assessment & Plan Note (Signed)
With his orange card, he would be referred to hepatitis clinic with [facility] at [facility].

## 2013-01-19 NOTE — Assessment & Plan Note (Signed)
States that his back pain has improved with physical therapy and TENS unit is helping him. -Continue physical therapy for now. -Would work on arranging TENS unit if possible for him. -He did not bring his medication bottles with him. He was advised to get all his medication bottles the next appointment.

## 2013-01-19 NOTE — Assessment & Plan Note (Signed)
He got a pneumonia shot today.

## 2013-01-21 ENCOUNTER — Ambulatory Visit: Payer: No Typology Code available for payment source | Admitting: Rehabilitation

## 2013-01-26 ENCOUNTER — Ambulatory Visit: Payer: No Typology Code available for payment source | Admitting: Rehabilitative and Restorative Service Providers"

## 2013-01-28 ENCOUNTER — Ambulatory Visit: Payer: No Typology Code available for payment source | Admitting: Rehabilitative and Restorative Service Providers"

## 2013-02-03 ENCOUNTER — Encounter: Payer: No Typology Code available for payment source | Admitting: Rehabilitative and Restorative Service Providers"

## 2013-02-04 ENCOUNTER — Encounter: Payer: Self-pay | Admitting: Internal Medicine

## 2013-02-04 ENCOUNTER — Ambulatory Visit: Payer: No Typology Code available for payment source | Admitting: Rehabilitative and Restorative Service Providers"

## 2013-03-03 ENCOUNTER — Encounter (HOSPITAL_COMMUNITY): Payer: Self-pay | Admitting: Emergency Medicine

## 2013-03-03 ENCOUNTER — Emergency Department (HOSPITAL_COMMUNITY)
Admission: EM | Admit: 2013-03-03 | Discharge: 2013-03-03 | Disposition: A | Discharge status: Home / Self Care | Payer: No Typology Code available for payment source | Attending: Emergency Medicine | Admitting: Emergency Medicine

## 2013-03-03 DIAGNOSIS — G8929 Other chronic pain: Secondary | ICD-10-CM | POA: Insufficient documentation

## 2013-03-03 DIAGNOSIS — Z8669 Personal history of other diseases of the nervous system and sense organs: Secondary | ICD-10-CM | POA: Insufficient documentation

## 2013-03-03 DIAGNOSIS — M549 Dorsalgia, unspecified: Secondary | ICD-10-CM

## 2013-03-03 DIAGNOSIS — E119 Type 2 diabetes mellitus without complications: Secondary | ICD-10-CM | POA: Insufficient documentation

## 2013-03-03 DIAGNOSIS — IMO0002 Reserved for concepts with insufficient information to code with codable children: Secondary | ICD-10-CM | POA: Insufficient documentation

## 2013-03-03 DIAGNOSIS — I10 Essential (primary) hypertension: Secondary | ICD-10-CM | POA: Insufficient documentation

## 2013-03-03 DIAGNOSIS — F17200 Nicotine dependence, unspecified, uncomplicated: Secondary | ICD-10-CM | POA: Insufficient documentation

## 2013-03-03 DIAGNOSIS — M545 Low back pain, unspecified: Secondary | ICD-10-CM | POA: Insufficient documentation

## 2013-03-03 DIAGNOSIS — Z79899 Other long term (current) drug therapy: Secondary | ICD-10-CM | POA: Insufficient documentation

## 2013-03-03 DIAGNOSIS — Z8719 Personal history of other diseases of the digestive system: Secondary | ICD-10-CM | POA: Insufficient documentation

## 2013-03-03 DIAGNOSIS — Z8619 Personal history of other infectious and parasitic diseases: Secondary | ICD-10-CM | POA: Insufficient documentation

## 2013-03-03 MED ORDER — KETOROLAC TROMETHAMINE 60 MG/2ML IM SOLN
60.0000 mg | Freq: Once | INTRAMUSCULAR | Status: AC
  Administered 2013-03-03: 60 mg via INTRAMUSCULAR
  Filled 2013-03-03: qty 2

## 2013-03-03 MED ORDER — NAPROXEN 500 MG PO TABS: 500.0000 mg | ORAL_TABLET | Freq: Two times a day (BID) | ORAL | Status: DC

## 2013-03-03 MED ORDER — CYCLOBENZAPRINE HCL 10 MG PO TABS: 10.0000 mg | ORAL_TABLET | Freq: Two times a day (BID) | ORAL | Status: DC | PRN

## 2013-03-03 MED ORDER — DIAZEPAM 5 MG PO TABS
5.0000 mg | ORAL_TABLET | Freq: Once | ORAL | Status: AC
  Administered 2013-03-03: 5 mg via ORAL
  Filled 2013-03-03: qty 1

## 2013-03-03 NOTE — ED Provider Notes (Signed)
History     CSN: [id number]  Arrival date & time 03/03/13  0915   First MD Initiated Contact with Patient 03/03/13 [phone]      Chief Complaint  Patient presents with  . Back Pain    (Consider location/radiation/quality/duration/timing/severity/associated sxs/prior treatment) HPI Comments: Patient is a 52 year old male with a past medical history of chronic low back pain who presents with worsening low back pain for the past 3 days. The pain worsened gradually and remained constant. The pain is sharp and severe and radiates down his right leg. The pain is constant. Movement makes the pain worse. Nothing makes the pain better. Patient has not tried anything for pain. He reports receiving an IM Toradol in the past which helped with the pain. No associated symptoms. No saddles paresthesias or bladder/bowel incontinence.     Patient is a 52 y.o. male presenting with back pain.  Back Pain   Past Medical History  Diagnosis Date  . Diabetes mellitus   . Neuromuscular disorder   . Pancreatitis 01/09/2012  . Hypertension goal BP (blood pressure) < 140/80   . Hepatitis C     [id number]    History reviewed. No pertinent past surgical history.  History reviewed. No pertinent family history.  History  Substance Use Topics  . Smoking status: Current Some Day Smoker -- 0.10 packs/day    Types: Cigarettes    Last Attempt to Quit: 01/05/2012  . Smokeless tobacco: Never Used  . Alcohol Use: 3.0 oz/week    5 Cans of beer per week      Review of Systems  Musculoskeletal: Positive for back pain.  All other systems reviewed and are negative.    Allergies  Review of patient's allergies indicates no known allergies.  Home Medications   Current Outpatient Rx  Name  Route  Sig  Dispense  Refill  . Blood Glucose Monitoring Suppl (BLOOD GLUCOSE METER) kit      Use up to 1x/day dx code 250.00   1 each   0   . chlorpheniramine (CHLOR-TRIMETON) 4 MG tablet   Oral   Take 1 tablet (4  mg total) by mouth 2 (two) times daily as needed for allergies.   14 tablet   0   . cyclobenzaprine (FLEXERIL) 5 MG tablet   Oral   Take 5 mg by mouth 3 (three) times daily as needed for muscle spasms.          . fluticasone (FLONASE) 50 MCG/ACT nasal spray   Nasal   Place 2 sprays into the nose daily.   16 g   0   . gabapentin (NEURONTIN) 300 MG capsule   Oral   Take 2 capsules (600 mg total) by mouth 3 (three) times daily.   90 capsule   3   . glucose blood (WAVESENSE PRESTO) test strip      Use up to 1x/day dx code 250.00   100 each   12   . glucose blood test strip      Use to check blood sugar up to 3 times a day before meals Dx code- 250.00   100 each   12   . ketorolac (TORADOL) 30 MG/ML injection   Intravenous   Inject 30 mg into the vein every 30 (thirty) days.         Marland Kitchen lisinopril (PRINIVIL,ZESTRIL) 5 MG tablet   Oral   Take 1 tablet (5 mg total) by mouth daily.   30 tablet  3   . meloxicam (MOBIC) 7.5 MG tablet      Take 1 pill in the morning for pain.  You may take a second pill if needed.   60 tablet   1   . metFORMIN (GLUCOPHAGE) 1000 MG tablet      Take 1 tablet by mouth twice daily.   60 tablet   3   . omeprazole (PRILOSEC) 20 MG capsule   Oral   Take 1 capsule (20 mg total) by mouth daily.   30 capsule   3   . sildenafil (VIAGRA) 50 MG tablet   Oral   Take 1 tablet (50 mg total) by mouth as needed for erectile dysfunction.   10 tablet   0   . traMADol (ULTRAM) 50 MG tablet   Oral   Take 1 tablet (50 mg total) by mouth every 6 (six) hours as needed for pain.   40 tablet   0     BP 130/83  Pulse 84  Temp(Src) 98 F (36.7 C) (Oral)  SpO2 99%  Physical Exam  Nursing note and vitals reviewed. Constitutional: He is oriented to person, place, and time. He appears well-developed and well-nourished. No distress.  HENT:  Head: Normocephalic and atraumatic.  Eyes: Conjunctivae are normal.  Neck: Normal range of motion.   Cardiovascular: Normal rate and regular rhythm.  Exam reveals no gallop and no friction rub.   No murmur heard. Pulmonary/Chest: Effort normal and breath sounds normal. He has no wheezes. He has no rales. He exhibits no tenderness.  Abdominal: Soft. There is no tenderness.  Musculoskeletal: Normal range of motion.  Right side paraspinal tenderness to palpation. No midline spinal tenderness to palpation. No obvious deformity.   Neurological: He is alert and oriented to person, place, and time. Coordination normal.  Lower extremity strength and sensation equal and intact bilaterally. Speech is goal-oriented. Moves limbs without ataxia.   Skin: Skin is warm and dry.  Psychiatric: He has a normal mood and affect. His behavior is normal.    ED Course  Procedures (including critical care time)  Labs Reviewed - No data to display No results found.   1. Back pain       MDM  9:50 AM Patient will have IM toradol and PO valium for pain. No saddle paresthesias/bladder bowel incontinence. Patient will have flexeril and naproxen prescription. No injury. No imaging indicated at this time.         Emilia Beck, [place] 03/03/13 [phone]

## 2013-03-03 NOTE — ED Provider Notes (Signed)
Medical screening examination/treatment/procedure(s) were performed by non-physician practitioner and as supervising physician I was immediately available for consultation/collaboration.   Carleene Cooper III, MD 03/03/13 2003

## 2013-03-03 NOTE — ED Notes (Signed)
Pt arrives to ed c/o back pain with bilat posterior leg pain.  Pt brought in ketoralac for IM injection. Pt sts he was told to bring to ed so staff could administer. Pt denies recent illness/injury. Pt can bear wt and ambulatory.  pmsx4.

## 2013-03-17 ENCOUNTER — Encounter: Payer: Self-pay | Admitting: Internal Medicine

## 2013-03-17 ENCOUNTER — Ambulatory Visit (INDEPENDENT_AMBULATORY_CARE_PROVIDER_SITE_OTHER): Payer: No Typology Code available for payment source | Admitting: Internal Medicine

## 2013-03-17 VITALS — BP 127/82 | HR 74 | Temp 97.0°F | Ht 69.0 in | Wt 181.7 lb

## 2013-03-17 DIAGNOSIS — E119 Type 2 diabetes mellitus without complications: Secondary | ICD-10-CM

## 2013-03-17 DIAGNOSIS — G5700 Lesion of sciatic nerve, unspecified lower limb: Secondary | ICD-10-CM

## 2013-03-17 DIAGNOSIS — E1149 Type 2 diabetes mellitus with other diabetic neurological complication: Secondary | ICD-10-CM

## 2013-03-17 DIAGNOSIS — G5701 Lesion of sciatic nerve, right lower limb: Secondary | ICD-10-CM

## 2013-03-17 DIAGNOSIS — IMO0002 Reserved for concepts with insufficient information to code with codable children: Secondary | ICD-10-CM

## 2013-03-17 DIAGNOSIS — G8929 Other chronic pain: Secondary | ICD-10-CM

## 2013-03-17 LAB — GLUCOSE, CAPILLARY: Glucose-Capillary: 178 mg/dL — ABNORMAL HIGH (ref 70–99)

## 2013-03-17 MED ORDER — NAPROXEN 500 MG PO TABS: 500.0000 mg | ORAL_TABLET | Freq: Two times a day (BID) | ORAL | Status: DC

## 2013-03-17 NOTE — Progress Notes (Signed)
Patient: Donald Palmer   MRN: [id number]  DOB: 1961/10/28  PCP: Donald Jarvis, MD   Subjective:    HPI: [name] is a 52 y.o. male with a PMHx as outlined below, who presented to clinic today for the following:  1) Back pain - Patient describes a several years (2007) history of gradually worsening, aching back pain located over BL legs initially below the knees and now from his back and going down his legs. Symptoms are worst on the right. Describes that he wears his wallet on his right back pocket and recently, this has been exacerbating the pain. Currently rated 8/10 on right side and 6/10 on left in severity. At its worst, the pain is rated 10/10 in severity. Aggravating factors include: bending down, twisting, turning, having wallet in back pocket. Previously doing physical therapy, has completed for unclear reasons. Alleviating factors include: TENS unit helped.. Patient confirms shooting pain back of leg to foot. Denies symptoms of weakness, numbness, tingling, new tingling, perianal numbness, fever, chills.  2) DM2, uncontrolled -  Lab Results  Component Value Date   HGBA1C 7.3 01/19/2013   Patient checking blood sugars 2 times daily, at least before breakfast. Currently taking metformin 1000mg  daily.Misses doses occasionally, because he is only taking it when his BG is high. Denies polyuria, polydipsia, nausea, vomiting, diarrhea.  does not request refills today.  Review of Systems: Per HPI.   Current Outpatient Medications: Unclear of his medications. Medication Sig  . Blood Glucose Monitoring Suppl (BLOOD GLUCOSE METER) kit Use up to 1x/day dx code 250.00  . chlorpheniramine (CHLOR-TRIMETON) 4 MG tablet Take 1 tablet (4 mg total) by mouth 2 (two) times daily as needed for allergies.  . cyclobenzaprine (FLEXERIL) 10 MG tablet Take 1 tablet (10 mg total) by mouth 2 (two) times daily as needed for muscle spasms.  . cyclobenzaprine (FLEXERIL) 5 MG tablet Take 5 mg by mouth 3 (three)  times daily as needed for muscle spasms.   . fluticasone (FLONASE) 50 MCG/ACT nasal spray Place 2 sprays into the nose daily.  Marland Kitchen gabapentin (NEURONTIN) 300 MG capsule Take 2 capsules (600 mg total) by mouth 3 (three) times daily.  Marland Kitchen glucose blood (WAVESENSE PRESTO) test strip Use up to 1x/day dx code 250.00  . glucose blood test strip Use to check blood sugar up to 3 times a day before meals Dx code- 250.00  . ketorolac (TORADOL) 30 MG/ML injection Inject 30 mg into the vein every 30 (thirty) days.  Marland Kitchen lisinopril (PRINIVIL,ZESTRIL) 5 MG tablet Take 1 tablet (5 mg total) by mouth daily.  . meloxicam (MOBIC) 7.5 MG tablet Take 1 pill in the morning for pain.  You may take a second pill if needed.  . metFORMIN (GLUCOPHAGE) 1000 MG tablet Take 1 tablet by mouth twice daily.  . naproxen (NAPROSYN) 500 MG tablet Take 1 tablet (500 mg total) by mouth 2 (two) times daily with a meal.  . omeprazole (PRILOSEC) 20 MG capsule Take 1 capsule (20 mg total) by mouth daily.  . sildenafil (VIAGRA) 50 MG tablet Take 1 tablet (50 mg total) by mouth as needed for erectile dysfunction.  . traMADol (ULTRAM) 50 MG tablet Take 1 tablet (50 mg total) by mouth every 6 (six) hours as needed for pain.   Allergies No Known Allergies  Past Medical History  Diagnosis Date  . Diabetes mellitus   . Neuromuscular disorder   . Pancreatitis 01/09/2012  . Hypertension goal BP (blood pressure) < 140/80   .  Hepatitis C     [id number]   Surgical history No past surgical history on file.   Objective:    Filed Vitals:   03/17/13 0911  BP: 127/82  Pulse: 74  Temp: 97 F (36.1 C)    Physical Exam  Constitutional: He appears well-developed and well-nourished.  HENT:  Head: Normocephalic and atraumatic.  Neck: Normal range of motion. Neck supple.  Cardiovascular: Normal rate, regular rhythm and normal heart sounds.   Pulmonary/Chest: Effort normal and breath sounds normal.  Abdominal: Soft. Bowel sounds are normal.    Musculoskeletal:       Lumbar back: He exhibits tenderness, pain and spasm.       Back:  Tenderness to palpation and spasm over right lumbar paraspinal musculature. Point tenderness and spasm over piriformis muscle on right.  Negative straight leg raise bilaterally. On right, reproduction of lumbar back pain and hamstring tightness without shooting pains. On left, full ROM without reproduction of pain.     Assessment/ Plan:    The patient's case and plan of care was discussed with attending physician, Dr. Doneen Poisson.

## 2013-03-17 NOTE — Patient Instructions (Signed)
General Instructions:  Please follow-up at the clinic in 3 weeks, at which time we will reevaluate your back pain, diabetes - OR, please follow-up in the clinic sooner if needed.  There have been changes in your medications:  STOP Meloxicam  START Naproxen 500mg  twice daily.  Do the piriformis exercises daily.   Start taking your Metformin daily  If you have been started on new medication(s), and you develop symptoms concerning for allergic reaction, including, but not limited to, throat closing, tongue swelling, rash, please stop the medication immediately and call the clinic at [phone], and go to the ER.  If you are diabetic, please bring your meter to your next visit.  If symptoms worsen, or new symptoms arise, please call the clinic or go to the ER.  PLEASE BRING ALL OF YOUR MEDICATIONS  IN A BAG TO YOUR NEXT APPOINTMENT   Treatment Goals:  Goals (1 Years of Data) as of 03/17/13         As of Today 03/03/13 01/19/13 12/08/12 10/27/12     Blood Pressure    . Blood Pressure < 140/90  127/82 130/83 122/85 130/85 138/87    . Blood Pressure < 140/90  127/82 130/83 122/85 130/85 138/87     Result Component    . HEMOGLOBIN A1C < 6.5    7.3  7.0    . HEMOGLOBIN A1C < 7.0    7.3  7.0    . LDL CALC < 100          . LDL CALC < 100            Progress Toward Treatment Goals:  Treatment Goal 01/19/2013  Hemoglobin A1C at goal  Blood pressure at goal  Stop smoking smoking less    Self Care Goals & Plans:  Self Care Goal 03/17/2013  Manage my medications take my medicines as prescribed; bring my medications to every visit; refill my medications on time  Monitor my health keep track of my blood glucose; bring my glucose meter and log to each visit  Eat healthy foods drink diet soda or water instead of juice or soda; eat more vegetables; eat foods that are low in salt; eat baked foods instead of fried foods  Be physically active find an activity I enjoy  Stop smoking go to the  [employer] ([email or web address])    Home Blood Glucose Monitoring 01/19/2013  Check my blood sugar 3 times a day  When to check my blood sugar before meals     Care Management & Community Referrals:  Referral 01/19/2013  Referrals made for care management support none needed        Piriformis Syndrome with Rehab Piriformis syndrome is a condition the affects the nervous system in the area of the hip, and is characterized by pain and possibly a loss of feeling in the backside (posterior) thigh that may extend down the entire length of the leg. The symptoms are caused by an increase in pressure on the sciatic nerve by the piriformis muscle, which is on the back of the hip and is responsible for externally rotating the hip. The sciatic nerve and its branches connect to much of the leg. Normally the sciatic nerve runs between the piriformis muscle and other muscles. However, in certain individuals the nerve runs through the muscle, which causes an increase in pressure on the nerve and results in the symptoms of piriformis syndrome. SYMPTOMS   Pain, tingling, numbness, or burning in the back of the  thigh that may also extend down the entire leg.  Occasionally, tenderness in the buttock.  Loss of function of the leg.  Pain that worsens when using the piriformis muscle (running, jumping, or stairs).  Pain that increases with prolonged sitting.  Pain that is lessened by laying flat on the back. CAUSES   Piriformis syndrome is the result of an increase in pressure placed on the sciatic nerve. Often times piriformis syndrome is an overuse injury.  Stress placed on the nerve from a sudden increase in the intensity, frequency, or duration of training.  Compensation of other extremity injuries. RISK INCREASES WITH:  Sports that involve the piriformis muscle (running, walking or jumping).  You are born with (congenital) a defect in which the sciatic nerve passes through the  muscle. PREVENTION  Warm up and stretch properly before activity.  Allow for adequate recovery between workouts.  Maintain physical fitness:  Strength, flexibility, and endurance.  Cardiovascular fitness. PROGNOSIS  If treated properly, then the symptoms of piriformis syndrome usually resolve in 2 to 6 weeks. RELATED COMPLICATIONS   Persistent and possibly permanent pain and numbness in the lower extremity.  Weakness of the extremity that may progress to disability and inability to compete. TREATMENT  The most effective treatment for piriformis syndrome is rest from any activities that aggravate the symptoms. Ice and pain medication may help reduce pain and inflammation. The use of strengthening and stretching exercises may help reduce pain with activity. These exercises may be performed at home or with a therapist. A referral to a therapist may be given for further evaluation and treatment, such as ultrasound. Corticosteroid injections may be given to reduce inflammation that is causing pressure to be placed on the sciatic nerve. If non-surgical (conservative) treatment is unsuccessful, then surgery may be recommended.  MEDICATION   If pain medication is necessary, then nonsteroidal anti-inflammatory medications, such as aspirin and ibuprofen, or other minor pain relievers, such as acetaminophen, are often recommended.  Do not take pain medication for 7 days before surgery.  Prescription pain relievers may be given if deemed necessary by your caregiver. Use only as directed and only as much as you need.  Corticosteroid injections may be given by your caregiver. These injections should be reserved for the most serious cases, because they may only be given a certain number of times. HEAT AND COLD:   Cold treatment (icing) relieves pain and reduces inflammation. Cold treatment should be applied for 10 to 15 minutes every 2 to 3 hours for inflammation and pain and immediately after any  activity that aggravates your symptoms. Use ice packs or massage the area with a piece of ice (ice massage).  Heat treatment may be used prior to performing the stretching and strengthening activities prescribed by your caregiver, physical therapist, or athletic trainer. Use a heat pack or soak the injury in warm water. SEEK IMMEDIATE MEDICAL CARE IF:  Treatment seems to offer no benefit, or the condition worsens.  Any medications produce adverse side effects. EXERCISES RANGE OF MOTION (ROM) AND STRETCHING EXERCISES - Piriformis Syndrome These exercises may help you when beginning to rehabilitate your injury. Your symptoms may resolve with or without further involvement from your physician, physical therapist or athletic trainer. While completing these exercises, remember:   Restoring tissue flexibility helps normal motion to return to the joints. This allows healthier, less painful movement and activity.  An effective stretch should be held for at least 30 seconds.  A stretch should never be painful. You  should only feel a gentle lengthening or release in the stretched tissue. STRETCH - Hip Rotators  Lie on your back on a firm surface. Grasp your right / left knee with your right / left hand and your ankle with your opposite hand.  Keeping your hips and shoulders firmly planted, gently pull your right / left knee and rotate your lower leg toward your opposite shoulder until you feel a stretch in your buttocks.  Hold this stretch for __________ seconds. Repeat this stretch __________ times. Complete this stretch __________ times per day. STRETCH  Iliotibial Band  On the floor or bed, lie on your side so your right / left leg is on top. Bend your knee and grab your ankle.  Slowly bring your knee back so that your thigh is in line with your trunk. Keep your heel at your buttocks and gently arch your back so your head, shoulders and hips line up.  Slowly lower your leg so that your knee  approaches the floor/bed until you feel a gentle stretch on the outside of your right / left thigh. If you do not feel a stretch and your knee will not fall farther, place the heel of your opposite foot on top of your knee and pull your thigh down farther.  Hold this stretch for __________ seconds. Repeat __________ times. Complete __________ times per day. STRENGTHENING EXERCISES - Piriformis Syndrome  These are some of the caregiver again or until your symptoms are resolved. Remember:   Strong muscles with good endurance tolerate stress better.  Do the exercises as initially prescribed by your caregiver. Progress slowly with each exercise, gradually increasing the number of repetitions and weight used under their guidance. STRENGTH - Hip Abductors, Straight Leg Raises Be aware of your form throughout the entire exercise so that you exercise the correct muscles. Sloppy form means that you are not strengthening the correct muscles.  Lie on your side so that your head, shoulders, knee and hip line up. You may bend your lower knee to help maintain your balance. Your right / left leg should be on top.  Roll your hips slightly forward, so that your hips are stacked directly over each other and your right / left knee is facing forward.  Lift your top leg up 4-6 inches, leading with your heel. Be sure that your foot does not drift forward or that your knee does not roll toward the ceiling.  Hold this position for __________ seconds. You should feel the muscles in your outer hip lifting (you may not notice this until your leg begins to tire).  Slowly lower your leg to the starting position. Allow the muscles to fully relax before beginning the next repetition. Repeat __________ times. Complete this exercise __________ times per day.  STRENGTH - Hip Abductors, Quadriped  On a firm, lightly padded surface, position yourself on your hands and knees. Your hands should be directly below your shoulders  and your knees should be directly below your hips.  Keeping your right / left knee bent, lift your leg out to the side. Keep your legs level and in line with your shoulders.  Position yourself on your hands and knees.  Hold for __________ seconds.  Keeping your trunk steady and your hips level, slowly lower your leg to the starting position. Repeat __________ times. Complete this exercise __________ times per day.  STRENGTH - Hip Abductors, Standing  Tie one end of a rubber exercise band/tubing to a secure surface (table, pole) and  tie a loop at the other end.  Place the loop around your right / left ankle. Keeping your ankle with the band directly opposite of the secured end, step away until there is tension in the tube/band.  Hold onto a chair as needed for balance.  Keeping your back upright, your shoulders over your hips, and your toes pointing forward, lift your right / left leg out to your side. Be sure to lift your leg with your hip muscles. Do not "throw" your leg or tip your body to lift your leg.  Slowly and with control, return to the starting position. Repeat exercise __________ times. Complete this exercise __________ times per day.  Document Released: 12/03/2005 Document Revised: 06/03/2012 Document Reviewed: 03/17/2009 [facility] Patient Information 2013 [place], [place].

## 2013-03-17 NOTE — Assessment & Plan Note (Signed)
Pertinent Labs: Lab Results  Component Value Date   HGBA1C 7.3 01/19/2013   HGBA1C 7.6* 01/10/2012   CREATININE 1.06 08/07/2012   CREATININE 0.78 01/10/2012   MICROALBUR 0.86 01/30/2012   MICRALBCREAT 4.5 01/30/2012   CHOL 184 04/28/2012   HDL 40 04/28/2012   TRIG 335* 04/28/2012    Assessment: Disease Control:  uncontrolled  Progress toward goals:  unchanged  Barriers to meeting goals: nonadherence to medications    Patient is noncompliant some of the time with prescribed medications.   Plan: Glucometer log was not reviewed today, as pt did not have glucometer available for review.   continue current medications  Foot exam today.  Pt advised to start taking his metformin daily without missing doses.  Educational resources provided: brochure  Self management tools provided:    Home glucose monitoring recommendation:    continue to check 1-2 times daily.

## 2013-03-17 NOTE — Assessment & Plan Note (Signed)
Pertinent Data: MRI (06/2012) - Mild disc bulging and spondylosis at L4-5 contributes to mild  foraminal encroachment bilaterally. No acute disc protrusion or  stenosis. No mass lesion is identified.   Assessment: Patient may have component of piriformis syndrome as suggested by symptoms of pain worse with placement of wallet in back pocket and PE of direct tenderness to palpation over piriformis musculature. No red flag symptoms such as fevers, chill, saddle anesthesias. No new neurologic compromise.  Plan:      Continue BID Naproxen  Instructed to DC Meloxicam while on naproxen  Start piriformis exercises - handout given  Return to PT  Will consider sports med referral for consideration of steroid injections - if symptoms persistent and continue to be concerning for piriformis syndrome.

## 2013-03-20 NOTE — Progress Notes (Signed)
Case discussed with Dr. Kalia-Reynolds at the time of the visit.  We reviewed the resident's history and exam and pertinent patient test results.  I agree with the assessment, diagnosis and plan of care documented in the resident's note. 

## 2013-03-30 ENCOUNTER — Ambulatory Visit: Payer: No Typology Code available for payment source

## 2013-04-01 ENCOUNTER — Other Ambulatory Visit: Payer: Self-pay | Admitting: Internal Medicine

## 2013-04-07 ENCOUNTER — Ambulatory Visit: Payer: No Typology Code available for payment source | Attending: Internal Medicine

## 2013-04-07 ENCOUNTER — Ambulatory Visit (INDEPENDENT_AMBULATORY_CARE_PROVIDER_SITE_OTHER): Payer: No Typology Code available for payment source | Admitting: Internal Medicine

## 2013-04-07 ENCOUNTER — Encounter: Payer: Self-pay | Admitting: Internal Medicine

## 2013-04-07 VITALS — BP 155/97 | HR 81 | Temp 96.9°F | Ht 69.0 in | Wt 189.3 lb

## 2013-04-07 DIAGNOSIS — G8929 Other chronic pain: Secondary | ICD-10-CM

## 2013-04-07 DIAGNOSIS — I10 Essential (primary) hypertension: Secondary | ICD-10-CM

## 2013-04-07 DIAGNOSIS — IMO0002 Reserved for concepts with insufficient information to code with codable children: Secondary | ICD-10-CM

## 2013-04-07 DIAGNOSIS — IMO0001 Reserved for inherently not codable concepts without codable children: Secondary | ICD-10-CM | POA: Insufficient documentation

## 2013-04-07 DIAGNOSIS — M5416 Radiculopathy, lumbar region: Secondary | ICD-10-CM

## 2013-04-07 DIAGNOSIS — E1149 Type 2 diabetes mellitus with other diabetic neurological complication: Secondary | ICD-10-CM

## 2013-04-07 DIAGNOSIS — M545 Low back pain, unspecified: Secondary | ICD-10-CM | POA: Insufficient documentation

## 2013-04-07 DIAGNOSIS — M2569 Stiffness of other specified joint, not elsewhere classified: Secondary | ICD-10-CM | POA: Insufficient documentation

## 2013-04-07 LAB — GLUCOSE, CAPILLARY: Glucose-Capillary: 165 mg/dL — ABNORMAL HIGH (ref 70–99)

## 2013-04-07 NOTE — Assessment & Plan Note (Signed)
Pertinent Data: MRI (06/2012) - Mild disc bulging and spondylosis at L4-5 contributes to mild foraminal encroachment bilaterally. No acute disc protrusion or stenosis. No mass lesion is identified.   Assessment: During our last visit together earlier this month, I was concerned about possible component of piriformis syndrome as suggested by symptoms of pain worse with placement of wallet in back pocket and PE of direct tenderness to palpation over piriformis musculature. He was recommended continued PT, NSAIDs, and piriformis exercises. States he is using these and they are somewhat effective at improving, but not resolving his pain. No red flag symptoms such as fevers, chill, saddle anesthesias. No new neurologic compromise.  Plan:      Continue BID Naproxen with food.  Continue piriformis exercises - handout given  Continue PT - with use of TENS unit   If persistent, will consider sports med referral for consideration of steroid injections - if symptoms persistent and continue to be concerning for piriformis syndrome.

## 2013-04-07 NOTE — Patient Instructions (Addendum)
General Instructions:  Please follow-up at the clinic in 3 months, at which time we will reevaluate your diabetes and back pain - OR, please follow-up in the clinic sooner if needed.  Please see Norm Parcel about diabetes diet education.  Work on trying to quit smoking - see tips below.  There have not been changes in your medications.    If you have been started on new medication(s), and you develop symptoms concerning for allergic reaction, including, but not limited to, throat closing, tongue swelling, rash, please stop the medication immediately and call the clinic at [phone], and go to the ER.  If you are diabetic, please bring your meter to your next visit.  If symptoms worsen, or new symptoms arise, please call the clinic or go to the ER.  PLEASE BRING ALL OF YOUR MEDICATIONS  IN A BAG TO YOUR NEXT APPOINTMENT   Treatment Goals:  Goals (1 Years of Data) as of 04/07/13         As of Today 03/17/13 03/03/13 01/19/13 12/08/12     Blood Pressure    . Blood Pressure < 140/90  155/97 127/82 130/83 122/85 130/85    . Blood Pressure < 140/90  155/97 127/82 130/83 122/85 130/85     Result Component    . HEMOGLOBIN A1C < 6.5     7.3     . HEMOGLOBIN A1C < 7.0     7.3     . LDL CALC < 100          . LDL CALC < 100            Progress Toward Treatment Goals:  Treatment Goal 04/07/2013  Hemoglobin A1C improved  Blood pressure at goal  Stop smoking -    Self Care Goals & Plans:  Self Care Goal 04/07/2013  Manage my medications take my medicines as prescribed; bring my medications to every visit; refill my medications on time  Monitor my health bring my glucose meter and log to each visit; keep track of my blood glucose  Eat healthy foods drink diet soda or water instead of juice or soda; eat more vegetables; eat foods that are low in salt; eat baked foods instead of fried foods  Be physically active find an activity I enjoy  Stop smoking -    Home Blood Glucose Monitoring  01/19/2013  Check my blood sugar 3 times a day  When to check my blood sugar before meals     Care Management & Community Referrals:  Referral 01/19/2013  Referrals made for care management support none needed     LIFESTYLE TIPS TO HELP WITH YOUR BLOOD PRESSURE CONTROL  DASH DIET:  The DASH diet stands for "Dietary Approaches to Stop Hypertension." It is a healthy eating plan that has been shown to reduce high blood pressure (hypertension) in as little as 14 days, while also possibly providing other significant health benefits. These other health benefits include reducing the risk of breast cancer after menopause and reducing the risk of type 2 diabetes, heart disease, colon cancer, and stroke. Health benefits also include weight loss and slowing kidney failure in patients with chronic kidney disease.   Diet guidelines: Limit salt (sodium). Your diet should contain less than 1500 mg of sodium daily.  Limit refined or processed carbohydrates. Your diet should include mostly whole grains. Desserts and added sugars should be used sparingly.  Include small amounts of heart-healthy fats. These types of fats include nuts, oils, and tub  margarine. Limit saturated and trans fats. These fats have been shown to be harmful in the body.   Choosing Foods: The following food groups are based on a 2000 calorie diet. See your Registered Dietitian for individual calorie needs.  Grains and Grain Products (6 to 8 servings daily)  Eat More Often: Whole-wheat bread, brown rice, whole-grain or wheat pasta, quinoa, popcorn without added fat or salt (air popped).  Eat Less Often: White bread, white pasta, white rice, cornbread.  Vegetables (4 to 5 servings daily)  Eat More Often: Fresh, frozen, and canned vegetables. Vegetables may be raw, steamed, roasted, or grilled with a minimal amount of fat.  Eat Less Often/Avoid: Creamed or fried vegetables. Vegetables in a cheese sauce.  Fruit (4 to 5 servings daily)    Eat More Often: All fresh, canned (in natural juice), or frozen fruits. Dried fruits without added sugar. One hundred percent fruit juice ( cup [237 mL] daily).  Eat Less Often: Dried fruits with added sugar. Canned fruit in light or heavy syrup.  [employer], Fish, and Poultry (2 servings or less daily. One serving is 3 to 4 oz [85-114 g]).  Eat More Often: Ninety percent or leaner ground beef, tenderloin, sirloin. Round cuts of beef, chicken breast, [place] breast. All fish. Grill, bake, or broil your meat. Nothing should be fried.  Eat Less Often/Avoid: Fatty cuts of meat, [place], or chicken leg, thigh, or wing. Fried cuts of meat or fish.  Dairy (2 to 3 servings)  Eat More Often: Low-fat or fat-free milk, low-fat plain or light yogurt, reduced-fat or part-skim cheese.  Eat Less Often/Avoid: Milk (whole, 2%, skim, or chocolate). Whole milk yogurt. Full-fat cheeses.  Nuts, Seeds, and Legumes (4 to 5 servings per week)  Eat More Often: All without added salt.  Eat Less Often/Avoid: Salted nuts and seeds, canned beans with added salt.  Fats and Sweets (limited)  Eat More Often: Vegetable oils, tub margarines without trans fats, sugar-free gelatin. Mayonnaise and salad dressings.  Eat Less Often/Avoid: Coconut oils, palm oils, butter, stick margarine, cream, half and half, cookies, candy, pie.   ________________________________________________________________________  Smoking Cessation Tips 1-800-QUIT-NOW  This document explains the best ways for you to quit smoking and new treatments to help. It lists new medicines that can double or triple your chances of quitting and quitting for good. It also considers ways to avoid relapses and concerns you may have about quitting, including weight gain.   Nicotine: A Powerful Addiction If you have tried to quit smoking, you know how hard it can be. It is hard because nicotine is a very addictive drug. For some people, it can be as addictive as heroin or  cocaine. Usually, people make 2 or 3 tries, or more, before finally being able to quit. Each time you try to quit, you can learn about what helps and what hurts. Quitting takes hard work and a lot of effort, but you can quit smoking.   Quitting smoking is one of the most important things you will ever do You will live longer, feel better, and live better.  The impact on your body of quitting smoking is felt almost immediately:   Five keys to quitting: Studies have shown that these 5 steps will help you quit smoking and quit for good. You have the best chances of quitting if you use them together:   1. GET READY  Set a quit date.  Change your environment.  Get rid of ALL cigarettes, ashtrays, matches,  and lighters in your home, car, and place of work.  Do not let people smoke in your home.  Review your past attempts to quit. Think about what worked and what did not.  Once you quit, do not smoke. NOT EVEN A PUFF!   2. GET SUPPORT AND ENCOURAGEMENT  Tell your family, friends, and coworkers that you are going to quit and need their support. Ask them not to smoke around you.  Get individual, group, or telephone counseling and support.  Many smokers find one or more of the many self-help books available useful in helping them quit and stay off tobacco.   3. LEARN NEW SKILLS AND BEHAVIORS  Try to distract yourself from urges to smoke. Talk to someone, go for a walk, or occupy your time with a task.  When you first try to quit, change your routine. Take a different route to work. Drink tea instead of coffee. Eat breakfast in a different place.  Do something to reduce your stress. Take a hot bath, exercise, or read a book.  Plan something enjoyable to do every day. Reward yourself for not smoking.  Explore interactive web-based programs that specialize in helping you quit.   4. GET MEDICINE AND USE IT CORRECTLY .  Medicines can help you stop smoking and decrease the urge to smoke. Combining  medicine with the above behavioral methods and support can quadruple your chances of successfully quitting smoking.  Talk with your doctor about these options.  5. BE PREPARED FOR RELAPSE OR DIFFICULT SITUATIONS  Most relapses occur within the first 3 months after quitting. Do not be discouraged if you start smoking again. Remember, most people try several times before they finally quit.  You may have symptoms of withdrawal because your body is used to nicotine. You may crave cigarettes, be irritable, feel very hungry, cough often, get headaches, or have difficulty concentrating.  The withdrawal symptoms are only temporary. They are strongest when you first quit, but they will go away within 10 to 14 days.   Quitting takes hard work and a lot of effort, but you can quit smoking.   FOR MORE INFORMATION  Smokefree.gov (http://www.davis-sullivan.com/) provides free, accurate, evidence-based information and professional assistance to help support the immediate and long-term needs of people trying to quit smoking.  Document Released: 11/27/2001 Document Re-Released: 05/23/2010  [facility] Patient Information 2011 [place], [place].      1800 Calorie Diet for Diabetes Meal Planning The 1800 calorie diet is designed for eating up to 1800 calories each day. Following this diet and making healthy meal choices can help improve overall health. This diet controls blood sugar (glucose) levels and can also help lower blood pressure and cholesterol. SERVING SIZES Measuring foods and serving sizes helps to make sure you are getting the right amount of food. The list below tells how big or small some common serving sizes are:  1 oz.........4 stacked dice.  3 oz........Marland KitchenDeck of cards.  1 tsp.......Marland KitchenTip of little finger.  1 tbs......Marland KitchenMarland KitchenThumb.  2 tbs.......Marland KitchenGolf ball.   cup......Marland KitchenHalf of a fist.  1 cup.......Marland KitchenA fist. GUIDELINES FOR CHOOSING FOODS The goal of this diet is to eat a variety of foods and limit  calories to 1800 each day. This can be done by choosing foods that are low in calories and fat. The diet also suggests eating small amounts of food frequently. Doing this helps control your blood glucose levels so they do not get too high or too low. Each meal or snack may include  a protein food source to help you feel more satisfied and to stabilize your blood glucose. Try to eat about the same amount of food around the same time each day. This includes weekend days, travel days, and days off work. Space your meals about 4 to 5 hours apart and add a snack between them if you wish.  For example, a daily food plan could include breakfast, a morning snack, lunch, dinner, and an evening snack. Healthy meals and snacks include whole grains, vegetables, fruits, lean meats, poultry, fish, and dairy products. As you plan your meals, select a variety of foods. Choose from the bread and starch, vegetable, fruit, dairy, and meat/protein groups. Examples of foods from each group and their suggested serving sizes are listed below. Use measuring cups and spoons to become familiar with what a healthy portion looks like. Bread and Starch Each serving equals 15 grams of carbohydrates.  1 slice bread.   bagel.   cup cold cereal (unsweetened).   cup hot cereal or mashed potatoes.  1 small potato (size of a computer mouse).   cup cooked pasta or rice.   English muffin.  1 cup broth-based soup.  3 cups of popcorn.  4 to 6 whole-wheat crackers.   cup cooked beans, peas, or corn. Vegetable Each serving equals 5 grams of carbohydrates.   cup cooked vegetables.  1 cup raw vegetables.   cup tomato or vegetable juice. Fruit Each serving equals 15 grams of carbohydrates.  1 small apple or orange.  1 cup watermelon or strawberries.   cup applesauce (no sugar added).  2 tbs raisins.   banana.   cup canned fruit, packed in water, its own juice, or sweetened with a sugar substitute.    cup unsweetened fruit juice. Dairy Each serving equals 12 to 15 grams of carbohydrates.  1 cup fat-free milk.  6 oz artificially sweetened yogurt or plain yogurt.  1 cup low-fat buttermilk.  1 cup soy milk.  1 cup almond milk. Meat/Protein  1 large egg.  2 to 3 oz meat, poultry, or fish.   cup low-fat cottage cheese.  1 tbs peanut butter.  1 oz low-fat cheese.   cup tuna in water.   cup tofu. Fat  1 tsp oil.  1 tsp trans-fat-free margarine.  1 tsp butter.  1 tsp mayonnaise.  2 tbs avocado.  1 tbs salad dressing.  1 tbs cream cheese.  2 tbs sour cream. SAMPLE 1800 CALORIE DIET PLAN Breakfast   cup unsweetened cereal (1 carb serving).  1 cup fat-free milk (1 carb serving).  1 slice whole-wheat toast (1 carb serving).   small banana (1 carb serving).  1 scrambled egg.  1 tsp trans-fat-free margarine. Lunch  Tuna sandwich.  2 slices whole-wheat bread (2 carb servings).   cup canned tuna in water, drained.  1 tbs reduced fat mayonnaise.  1 stalk celery, chopped.  2 slices tomato.  1 lettuce leaf.  1 cup carrot sticks.  24 to 30 seedless grapes (2 carb servings).  6 oz light yogurt (1 carb serving). Afternoon Snack  3 graham cracker squares (1 carb serving).  Fat-free milk, 1 cup (1 carb serving).  1 tbs peanut butter. Dinner  3 oz salmon, broiled with 1 tsp oil.  1 cup mashed potatoes (2 carb servings) with 1 tsp trans-fat-free margarine.  1 cup fresh or frozen green beans.  1 cup steamed asparagus.  1 cup fat-free milk (1 carb serving). Evening Snack  3 cups air-popped popcorn (  1 carb serving).  2 tbs parmesan cheese sprinkled on top. MEAL PLAN Use this worksheet to help you make a daily meal plan based on the 1800 calorie diet suggestions. If you are using this plan to help you control your blood glucose, you may interchange carbohydrate-containing foods (dairy, starches, and fruits). Select a variety of fresh  foods of varying colors and flavors. The total amount of carbohydrate in your meals or snacks is more important than making sure you include all of the food groups every time you eat. Choose from the following foods to build your day's meals:  8 Starches.  4 Vegetables.  3 Fruits.  2 Dairy.  6 to 7 oz Meat/Protein.  Up to 4 Fats. Your dietician can use this worksheet to help you decide how many servings and which types of foods are right for you. BREAKFAST Food Group and Servings / Food Choice Starch ________________________________________________________ Dairy _________________________________________________________ Fruit _________________________________________________________ Meat/Protein __________________________________________________ Fat ___________________________________________________________ LUNCH Food Group and Servings / Food Choice Starch ________________________________________________________ Meat/Protein __________________________________________________ Vegetable _____________________________________________________ Fruit _________________________________________________________ Dairy _________________________________________________________ Fat ___________________________________________________________ Aura Fey Food Group and Servings / Food Choice Starch ________________________________________________________ Meat/Protein __________________________________________________ Fruit __________________________________________________________ Dairy _________________________________________________________ Laural Golden Food Group and Servings / Food Choice Starch _________________________________________________________ Meat/Protein ___________________________________________________ Dairy __________________________________________________________ Vegetable ______________________________________________________ Fruit  ___________________________________________________________ Fat ____________________________________________________________ Lollie Sails Food Group and Servings / Food Choice Fruit __________________________________________________________ Meat/Protein ___________________________________________________ Dairy __________________________________________________________ Starch _________________________________________________________ DAILY TOTALS Starch ____________________________ Vegetable _________________________ Fruit _____________________________ Dairy _____________________________ Meat/Protein______________________ Fat _______________________________ Document Released: 06/25/2005 Document Revised: 02/25/2012 Document Reviewed: 10/19/2011 [employer] Patient Information 2013 [place], [place].

## 2013-04-07 NOTE — Progress Notes (Signed)
Patient: Donald Palmer   MRN: [id number]  DOB: Apr 13, 1961  PCP: Elyse Jarvis, MD   Subjective:    CC: Follow-up and Medication Management   HPI: [name] is a 52 y.o. male with a PMHx as outlined below, who presented to clinic today for follow-up of the following:  1) Back pain - During our last visit together, the patient described a several years (2007) history of gradually worsening, aching back pain located over BL legs initially below the knees and now from his back and going down his legs. Symptoms are worst on the right that was exacerbated by wearing his wallet on his right back pocket. During our last visit, I diagnosed him with suspected piriformis syndrome, started NSAIDs, recommended PT, and gave piriformis exercises. He has been using these medications regularly. Has started PT this AM. Mildly improved, although not resolved and persistent.   Aggravating factors include: bending down, twisting, turning, having wallet in back pocket. Alleviating factors include: TENS unit helped.. Patient confirms shooting pain back of leg to foot. Denies symptoms of weakness, numbness, tingling, new tingling, perianal numbness, fever, chills.  2) DM2, uncontrolled -  Lab Results  Component Value Date   HGBA1C 7.3 01/19/2013   Patient checking blood sugars 2 times daily, at least before breakfast. Currently taking metformin 1000mg  daily. During last visit, pt noted he was frequently missing doses occasionally, because he is only taking it when his BG is high. He was told to start taking it regularly - which he has started doing. Says that he feels symptomatic with 80-90 mg/dL with wooziness and then subsequently treats it with food . Denies polyuria, polydipsia, nausea, vomiting, diarrhea. Does not request refills today.  3) HTN - Patient does not check blood pressure regularly at home. Currently taking Lisinopril 5mg  daily. Patient misses doses 0 x per week on average. denies headaches,  dizziness, lightheadedness, chest pain, shortness of breath.  does not request refills today.    Review of Systems: Per HPI.    Current Outpatient Medications: Medication Sig  . Blood Glucose Monitoring Suppl (BLOOD GLUCOSE METER) kit Use up to 1x/day dx code 250.00  . chlorpheniramine (CHLOR-TRIMETON) 4 MG tablet Take 1 tablet (4 mg total) by mouth 2 (two) times daily as needed for allergies.  . cyclobenzaprine (FLEXERIL) 10 MG tablet Take 1 tablet (10 mg total) by mouth 2 (two) times daily as needed for muscle spasms.  . fluticasone (FLONASE) 50 MCG/ACT nasal spray Place 2 sprays into the nose daily.  Marland Kitchen gabapentin (NEURONTIN) 300 MG capsule Take 2 capsules (600 mg total) by mouth 3 (three) times daily.  Marland Kitchen glucose blood (WAVESENSE PRESTO) test strip Use up to 1x/day dx code 250.00  . glucose blood test strip Use to check blood sugar up to 3 times a day before meals Dx code- 250.00  . lisinopril (PRINIVIL,ZESTRIL) 5 MG tablet Take 1 tablet (5 mg total) by mouth daily.  . metFORMIN (GLUCOPHAGE) 1000 MG tablet TAKE ONE TABLET BY MOUTH TWICE DAILY  . naproxen (NAPROSYN) 500 MG tablet Take 1 tablet (500 mg total) by mouth 2 (two) times daily with a meal.  . omeprazole (PRILOSEC) 20 MG capsule Take 1 capsule (20 mg total) by mouth daily.  . traMADol (ULTRAM) 50 MG tablet Take 1 tablet (50 mg total) by mouth every 6 (six) hours as needed for pain.    Allergies: No Known Allergies   Past Medical History  Diagnosis Date  . Diabetes mellitus   .  Neuromuscular disorder   . Pancreatitis 01/09/2012  . Hypertension goal BP (blood pressure) < 140/80   . Hepatitis C     [id number]     Objective:    Physical Exam: Filed Vitals:   04/07/13 0914  BP: 155/97  Pulse: 81  Temp: 96.9 F (36.1 C)     Physical Exam  Constitutional: He appears well-developed and well-nourished.  HENT:  Head: Normocephalic and atraumatic.  Neck: Normal range of motion. Neck supple.  Cardiovascular: Normal  rate, regular rhythm and normal heart sounds.   Pulmonary/Chest: Effort normal and breath sounds normal.  Abdominal: Soft. Bowel sounds are normal.  Musculoskeletal:  Tenderness to palpation over right lumbar paraspinal musculature. Point tenderness and spasm over piriformis muscle on right. Negative straight leg raise bilaterally.   Assessment/ Plan:   The patient's case and plan of care was discussed with attending physician, Dr. Lars Mage.

## 2013-04-07 NOTE — Assessment & Plan Note (Addendum)
Pertinent Labs: Lab Results  Component Value Date   HGBA1C 7.3 01/19/2013   HGBA1C 7.6* 01/10/2012   CREATININE 1.06 08/07/2012   CREATININE 0.78 01/10/2012   MICROALBUR 0.86 01/30/2012   MICRALBCREAT 4.5 01/30/2012   CHOL 184 04/28/2012   HDL 40 04/28/2012   TRIG 335* 04/28/2012    Assessment: Disease Control: fair controluncontrolled  Progress toward goals: improvedunchanged  Barriers to meeting goals: nonadherence to medications    Patient is noncompliant some of the time with prescribed medications.   Plan: Glucometer log was reviewed today, as pt did have glucometer available for review.   continue current medications  Pt advised to continue taking his metformin daily without missing doses.  Have him talk to Lupita Leash as diet is definitely affecting his mealtime hyperglycemia (eating a lot of gravies, fried foods) and he is interested in improving.  Educational resources provided: Lawyer tools provided: copy of home glucose meter download;home glucose logbook  Home glucose monitoring recommendation:    continue to check 1-2 times daily.

## 2013-04-07 NOTE — Assessment & Plan Note (Signed)
Pertinent Data: BP Readings from Last 3 Encounters:  04/07/13 155/97  03/17/13 127/82  03/03/13 130/83    Basic Metabolic Panel:    Component Value Date/Time   NA 137 08/07/2012 0929   K 4.4 08/07/2012 0929   CL 103 08/07/2012 0929   CO2 26 08/07/2012 0929   BUN 16 08/07/2012 0929   CREATININE 1.06 08/07/2012 0929   CREATININE 0.78 01/10/2012 0505   GLUCOSE 184* 08/07/2012 0929   CALCIUM 9.3 08/07/2012 0929    Assessment: Disease Control: mildly elevated  Progress toward goals: at goal  Barriers to meeting goals: no barriers identified    Mild elevation today with recheck, but isolated elevation from prior historical values. Therefore, will not adjust medications today.  Patient is compliant most of the time with prescribed medications.   Plan:  continue current medications  Reassess next visit with consideration to escalate Lisinopril if persistent elevation.  Educational resources provided: Lawyer tools provided: home blood pressure logbook

## 2013-04-10 NOTE — Progress Notes (Signed)
Case discussed with Dr. Kalia-Reynolds at the time of the visit, immediately after the resident saw the patient.  I reviewed the resident's history and exam and pertinent patient test results.  I agree with the assessment, diagnosis and plan of care documented in the resident's note.   

## 2013-04-13 ENCOUNTER — Ambulatory Visit: Payer: No Typology Code available for payment source | Attending: Internal Medicine | Admitting: Physical Therapy

## 2013-04-15 ENCOUNTER — Ambulatory Visit: Payer: No Typology Code available for payment source | Admitting: Physical Therapy

## 2013-04-16 ENCOUNTER — Encounter: Payer: Self-pay | Admitting: Dietician

## 2013-04-16 ENCOUNTER — Ambulatory Visit (INDEPENDENT_AMBULATORY_CARE_PROVIDER_SITE_OTHER): Payer: No Typology Code available for payment source | Admitting: Dietician

## 2013-04-16 VITALS — Wt 185.5 lb

## 2013-04-16 DIAGNOSIS — E1149 Type 2 diabetes mellitus with other diabetic neurological complication: Secondary | ICD-10-CM

## 2013-04-16 NOTE — Progress Notes (Signed)
Diabetes Self-Management Training (DSMT)  1st follow up Visit- last visit 07/2012  04/16/2013 [name], identified by name and date of birth, is a 52 y.o. male with Type 2 Diabetes. Year of diabetes diagnosis: 2003  ASSESSMENT Patient concerns are Glycemic control.  Weight 185 lb 8 oz (84.142 kg). Body mass index is 27.38 kg/(m^2). Lab Results  Component Value Date   LDLCALC 77 04/28/2012   Lab Results  Component Value Date   HGBA1C 7.3 01/19/2013   Family history of diabetes: Yes Support systems: significant other Special needs: Simplified materials, Verbal instruction Patients belief/attitude about diabetes: Diabetes can be controlled. Self foot exams daily: Yes Diabetes Complications: Neuropathy Prior DM Education: Yes   Medications See Medications list.  Is interested in learning more- discussed insulin today per patient's request   Exercise Plan Doing ADLs only right now due to back pain. Some walking but not on regular basis   Self-Monitoring Monitor: did not bring A1C:patient said he wanted this done today, unsure if it got done Reports 70-200s in am and PM    Meal Planning Some knowledge and Interested in improving   Assessment comments:  Weight goal 170s, weight loss of 5-10# discussed. Patient agreed that he eats large portions, some sweets and fried and high fat dairy foods that he could change. Walks sometimes, trying to quit smoking  INDIVIDUAL DIABETES EDUCATION PLAN:  Diabetes disease state Nutrition management Monitoring Chronic complications _______________________________________________________________________  Intervention TOPICS COVERED TODAY:   Nutrition management  Role of diet in the treatment of diabetes and the relationship between the three main macronutritents and blood glucose control. Reviewed blood glucose goals for pre and post meals and how to evaluate the patients' food intake on their blood glucose level. Information on hints  to eating out and maintain blood glucose control. Meal options for control of blood glucose level and chronic complications. Monitoring  Purpose and frequency of SMBG. Physical activity- discussed role of exercise in controlling blood sugar nd improving pain from neuropathy.   PATIENTS GOALS/PLAN (copy and paste in patient instructions so patient receives a copy): 1.  Learning Objective:       State what is in a balanced meal, what foods raise blood sugar the most. 2.  Behavioral Objective:         Monitoring: To identify blood glucose trends, I will bring meter to next clinic visit Sometimes 25%  Personalized Follow-Up Plan for Ongoing Self Management Support:  Doctor's Office, friends, family and CDE visits ______________________________________________________________________   Outcomes Expected outcomes: Demonstrated interest in learning.Expect positive changes in lifestyle. Self-care Barriers: Lack of material resources Education material provided: yes, also had patient watch videos on meal planning and what makes blood sugar go up an down Patient to contact team via Phone if problems or questions. Time in: 0930     Time out: 1030 Future DSMT - 4-6 wks   Plyler, Lupita Leash

## 2013-04-16 NOTE — Patient Instructions (Addendum)
Please eat smaller amounts of starchy and sugary foods, eat more vegetables, fruits and low fat protein and dairy.  You can snack on vegetables, low fat cheese, nuts between meals.   Walking every day will help lower your blood sugars and may also help the pain in your legs and feet- quitting smoking is most important to lowering your blood sugar and the health of your feet. You should check your feet every day.   See handouts for healthier fats, and food choices and plate method handout for how to et at meals.   Please make a follow up appointment in 4 weeks. Bring your meter with you.   Lupita Leash [phone]

## 2013-04-16 NOTE — Addendum Note (Signed)
Addended by: Remus Blake on: 04/16/2013 02:50 PM   Modules accepted: Orders

## 2013-04-20 ENCOUNTER — Ambulatory Visit: Payer: No Typology Code available for payment source | Attending: Internal Medicine | Admitting: Physical Therapy

## 2013-04-20 DIAGNOSIS — IMO0001 Reserved for inherently not codable concepts without codable children: Secondary | ICD-10-CM | POA: Insufficient documentation

## 2013-04-20 DIAGNOSIS — M545 Low back pain, unspecified: Secondary | ICD-10-CM | POA: Insufficient documentation

## 2013-04-20 DIAGNOSIS — M2569 Stiffness of other specified joint, not elsewhere classified: Secondary | ICD-10-CM | POA: Insufficient documentation

## 2013-04-22 ENCOUNTER — Other Ambulatory Visit: Payer: Self-pay | Admitting: *Deleted

## 2013-04-22 ENCOUNTER — Ambulatory Visit (INDEPENDENT_AMBULATORY_CARE_PROVIDER_SITE_OTHER): Payer: No Typology Code available for payment source | Admitting: *Deleted

## 2013-04-22 ENCOUNTER — Ambulatory Visit: Payer: No Typology Code available for payment source | Admitting: Physical Therapy

## 2013-04-22 DIAGNOSIS — Z23 Encounter for immunization: Secondary | ICD-10-CM

## 2013-04-22 DIAGNOSIS — B1920 Unspecified viral hepatitis C without hepatic coma: Secondary | ICD-10-CM

## 2013-04-22 MED ORDER — CYCLOBENZAPRINE HCL 10 MG PO TABS: 10.0000 mg | ORAL_TABLET | Freq: Two times a day (BID) | ORAL | Status: DC | PRN

## 2013-04-22 MED ORDER — LISINOPRIL 5 MG PO TABS: 5.0000 mg | ORAL_TABLET | Freq: Every day | ORAL | Status: DC

## 2013-04-22 NOTE — Telephone Encounter (Signed)
Rx called in 

## 2013-04-22 NOTE — Telephone Encounter (Signed)
Pt came to clinic with paperwork from [facility] for your signature.  It is in your box, please sign and fax back. Also pt would like a cane to help walk.  P/Therapy can assess for this if you put on the same paper. Pt was given his second Hep A vaccine today.

## 2013-04-23 NOTE — Telephone Encounter (Signed)
I did fill the form for the patient.  Thanks, [employer]

## 2013-05-18 ENCOUNTER — Ambulatory Visit: Payer: No Typology Code available for payment source | Attending: Internal Medicine

## 2013-05-18 DIAGNOSIS — M2550 Pain in unspecified joint: Secondary | ICD-10-CM | POA: Insufficient documentation

## 2013-05-18 DIAGNOSIS — IMO0001 Reserved for inherently not codable concepts without codable children: Secondary | ICD-10-CM | POA: Insufficient documentation

## 2013-05-21 ENCOUNTER — Ambulatory Visit: Payer: No Typology Code available for payment source | Admitting: Rehabilitation

## 2013-05-29 ENCOUNTER — Ambulatory Visit: Payer: No Typology Code available for payment source

## 2013-06-01 ENCOUNTER — Ambulatory Visit: Payer: No Typology Code available for payment source | Admitting: Rehabilitative and Restorative Service Providers"

## 2013-06-04 ENCOUNTER — Ambulatory Visit: Payer: No Typology Code available for payment source | Admitting: Rehabilitation

## 2013-06-08 ENCOUNTER — Ambulatory Visit: Payer: No Typology Code available for payment source

## 2013-06-10 ENCOUNTER — Ambulatory Visit: Payer: No Typology Code available for payment source | Admitting: Rehabilitation

## 2013-06-11 ENCOUNTER — Ambulatory Visit: Payer: No Typology Code available for payment source

## 2013-06-11 ENCOUNTER — Encounter: Payer: No Typology Code available for payment source | Admitting: Rehabilitation

## 2013-06-22 ENCOUNTER — Ambulatory Visit: Payer: No Typology Code available for payment source | Attending: Internal Medicine

## 2013-06-22 ENCOUNTER — Ambulatory Visit (INDEPENDENT_AMBULATORY_CARE_PROVIDER_SITE_OTHER): Payer: No Typology Code available for payment source | Admitting: Internal Medicine

## 2013-06-22 ENCOUNTER — Encounter: Payer: Self-pay | Admitting: Internal Medicine

## 2013-06-22 VITALS — BP 128/88 | HR 77 | Temp 97.1°F | Ht 69.0 in | Wt 178.8 lb

## 2013-06-22 DIAGNOSIS — M545 Low back pain, unspecified: Secondary | ICD-10-CM | POA: Insufficient documentation

## 2013-06-22 DIAGNOSIS — E1149 Type 2 diabetes mellitus with other diabetic neurological complication: Secondary | ICD-10-CM

## 2013-06-22 DIAGNOSIS — M5416 Radiculopathy, lumbar region: Secondary | ICD-10-CM

## 2013-06-22 DIAGNOSIS — R361 Hematospermia: Secondary | ICD-10-CM

## 2013-06-22 DIAGNOSIS — IMO0001 Reserved for inherently not codable concepts without codable children: Secondary | ICD-10-CM | POA: Insufficient documentation

## 2013-06-22 DIAGNOSIS — IMO0002 Reserved for concepts with insufficient information to code with codable children: Secondary | ICD-10-CM

## 2013-06-22 DIAGNOSIS — I10 Essential (primary) hypertension: Secondary | ICD-10-CM

## 2013-06-22 DIAGNOSIS — Z1211 Encounter for screening for malignant neoplasm of colon: Secondary | ICD-10-CM

## 2013-06-22 DIAGNOSIS — G8929 Other chronic pain: Secondary | ICD-10-CM

## 2013-06-22 DIAGNOSIS — M2569 Stiffness of other specified joint, not elsewhere classified: Secondary | ICD-10-CM | POA: Insufficient documentation

## 2013-06-22 LAB — GLUCOSE, CAPILLARY: Glucose-Capillary: 129 mg/dL — ABNORMAL HIGH (ref 70–99)

## 2013-06-22 LAB — POCT GLYCOSYLATED HEMOGLOBIN (HGB A1C): Hemoglobin A1C: 7

## 2013-06-22 NOTE — Patient Instructions (Addendum)
General Instructions:  Please follow up with physical therapy.    A referral has been made to sports medicine.  Treatment Goals:  Goals (1 Years of Data) as of 06/22/13         As of Today 04/07/13 03/17/13 03/03/13 01/19/13     Blood Pressure    . Blood Pressure < 140/90  128/88 155/97 127/82 130/83 122/85     Result Component    . HEMOGLOBIN A1C < 7.0  7.0    7.3    . LDL CALC < 100            Progress Toward Treatment Goals:  Treatment Goal 06/22/2013  Hemoglobin A1C at goal  Blood pressure at goal  Stop smoking smoking less    Self Care Goals & Plans:  Self Care Goal 06/22/2013  Manage my medications take my medicines as prescribed; bring my medications to every visit  Monitor my health keep track of my weight; keep track of my blood pressure  Eat healthy foods eat baked foods instead of fried foods; eat foods that are low in salt  Be physically active -  Stop smoking -    Home Blood Glucose Monitoring 06/22/2013  Check my blood sugar 2 times a day  When to check my blood sugar before breakfast; after dinner     Care Management & Community Referrals:  Referral 06/22/2013  Referrals made for care management support none needed  Referrals made to community resources none

## 2013-06-22 NOTE — Assessment & Plan Note (Signed)
BP Readings from Last 3 Encounters:  06/22/13 128/88  04/07/13 155/97  03/17/13 127/82    Lab Results  Component Value Date   NA 137 08/07/2012   K 4.4 08/07/2012   CREATININE 1.06 08/07/2012    Assessment: Blood pressure control: controlled Progress toward BP goal:  at goal  Plan: Medications:  continue current medications

## 2013-06-22 NOTE — Progress Notes (Signed)
  Subjective:    [name], male    DOB: February 12, 1961, 52 y.o.   MRN: [id number]  HPI Comments: Patient is a 52 yo male here for a continuity visit.  He has a PMH of DM, HTN, and chronic radicular low back pain.  His main complaint today is this past Saturday, he noticed some blood in his semen after intercourse.  He had never experienced this before.  He states he has some painful urination on occasion and also some hesitancy at times.  He denies any fever, chills, or N/V/D/C.  He tried an OTC pill he got at a drug store to help with his erectile dysfunction he experiences since being diagnosed with diabetes.  He believes this may have caused the blood in his semen.   With regard to his diabetes, he is currently taking metformin and denies any diarrhea or other side effects.  He checks his blood sugars 2xday, before breakfast and usually after dinner.  Before breakfast sugar has been averaging about 150 and after dinner is about 170.  He met with the dietician recently and has cut back on fried foods.  His HA1C today is 7.0.  With regard to his chronic back pain, he reports going to PT 2x/week.  The pain begins in the lower back and radiates down both legs.  PT has helped with the pain in his left leg but he still has pain in the right leg.  He also has some diabetic neuropathy in his lower extremities which likely contributes to his pain as well.  He is currently taking tramadol every 6 hours as needed for pain, cyclobenzaprine 10mg  bid, and naproxen bid.  He is amenable to referral to sports medicine for further treatment.    Review of Systems  Eyes: Positive for visual disturbance.  Respiratory: Positive for cough and shortness of breath.   Cardiovascular: Positive for leg swelling.  Gastrointestinal: Positive for nausea and vomiting. Negative for diarrhea, constipation and blood in stool.  Genitourinary: Positive for decreased urine volume. Negative for frequency and difficulty  urinating.  Neurological: Negative for light-headedness.  All other systems reviewed and are negative.      Objective:   Physical Exam  Nursing note and vitals reviewed. Constitutional: He is oriented to person, place, and time. He appears well-developed and well-nourished. No distress.  HENT:  Head: Normocephalic and atraumatic.  Eyes: EOM are normal. Pupils are equal, round, and reactive to light.  Neck: Neck supple.  Cardiovascular: Normal rate, regular rhythm and normal heart sounds.   Pulmonary/Chest: Effort normal and breath sounds normal. No respiratory distress.  Abdominal: Soft. Bowel sounds are normal.  Genitourinary: Rectum normal and prostate normal. Guaiac negative stool.  Musculoskeletal: Normal range of motion.  Neurological: He is alert and oriented to person, place, and time.  Skin: Skin is warm and dry. He is not diaphoretic.      Assessment & Plan:  Assessment: 1. Chronic Radicular Low Back Pain 2. Hematospermia 3. Diabetes Mellitus  Plan: 1. Chronic Radicular Low Back Pain: Made referral to sports medicine.  Continue with PT. 2. Hematospermia: DRE reveals nl prostate;  Most likely transient and benign. 3. Diabetes Mellitus: Pt HA1C is 7.0 at today's visit.  Continue current medications. 4. Health Maintenance: Pt needs a colonoscopy.  We discussed this today and he is amenable although reluctant.  We will revisit at next appt.  RTC in 6 weeks.

## 2013-06-22 NOTE — Assessment & Plan Note (Signed)
A referral to sports medicine was made for the patient today.

## 2013-06-22 NOTE — Assessment & Plan Note (Signed)
Lab Results  Component Value Date   HGBA1C 7.0 06/22/2013   HGBA1C 7.3 01/19/2013   HGBA1C 7.0 10/27/2012     Assessment: Diabetes control: good control (HgbA1C at goal) Progress toward A1C goal:  at goal   Plan: Medications:  continue current medications Home glucose monitoring: Frequency: 2 times a day Timing: before breakfast;after dinner Instruction/counseling given: discussed diet

## 2013-06-23 LAB — LIPID PANEL
LDL Cholesterol: 112 mg/dL — ABNORMAL HIGH (ref 0–99)
Triglycerides: 125 mg/dL (ref <150)
VLDL: 25 mg/dL (ref 0–40)

## 2013-06-23 LAB — URINALYSIS, MICROSCOPIC ONLY: Squamous Epithelial / LPF: NONE SEEN

## 2013-06-23 LAB — CBC WITH DIFFERENTIAL/PLATELET
Basophils Absolute: 0 10*3/uL (ref 0.0–0.1)
Basophils Relative: 1 % (ref 0–1)
Hemoglobin: 13.9 g/dL (ref 13.0–17.0)
Lymphocytes Relative: 51 % — ABNORMAL HIGH (ref 12–46)
MCHC: 33.2 g/dL (ref 30.0–36.0)
Monocytes Relative: 10 % (ref 3–12)
Neutro Abs: 2.3 10*3/uL (ref 1.7–7.7)
Neutrophils Relative %: 37 % — ABNORMAL LOW (ref 43–77)
RDW: 15.7 % — ABNORMAL HIGH (ref 11.5–15.5)
WBC: 6.3 10*3/uL (ref 4.0–10.5)

## 2013-06-23 LAB — COMPLETE METABOLIC PANEL WITH GFR
ALT: 38 U/L (ref 0–53)
AST: 34 U/L (ref 0–37)
CO2: 29 mEq/L (ref 19–32)
Calcium: 9.9 mg/dL (ref 8.4–10.5)
Chloride: 103 mEq/L (ref 96–112)
Creat: 0.84 mg/dL (ref 0.50–1.35)
GFR, Est African American: >89 mL/min
Potassium: 4.1 mEq/L (ref 3.5–5.3)
Sodium: 139 mEq/L (ref 135–145)
Total Protein: 7.7 g/dL (ref 6.0–8.3)

## 2013-06-23 LAB — URINALYSIS, ROUTINE W REFLEX MICROSCOPIC
Protein, ur: NEGATIVE mg/dL
Specific Gravity, Urine: 1.028 (ref 1.005–1.030)
Urobilinogen, UA: 1 mg/dL (ref 0.0–1.0)

## 2013-06-23 NOTE — Progress Notes (Signed)
I saw patient and discussed with Dr. Delane Ginger at the time of the visit.  We reviewed the resident's history and exam and pertinent patient test results.  I agree with the assessment, diagnosis, and plan of care documented in the resident's note.

## 2013-06-25 ENCOUNTER — Ambulatory Visit: Payer: No Typology Code available for payment source

## 2013-06-29 ENCOUNTER — Ambulatory Visit: Payer: No Typology Code available for payment source

## 2013-07-01 ENCOUNTER — Other Ambulatory Visit (INDEPENDENT_AMBULATORY_CARE_PROVIDER_SITE_OTHER): Payer: No Typology Code available for payment source

## 2013-07-01 DIAGNOSIS — Z1211 Encounter for screening for malignant neoplasm of colon: Secondary | ICD-10-CM

## 2013-07-01 LAB — POC HEMOCCULT BLD/STL (HOME/3-CARD/SCREEN)
Card #2 Fecal Occult Blod, POC: NEGATIVE
Card #3 Fecal Occult Blood, POC: NEGATIVE
Fecal Occult Blood, POC: NEGATIVE

## 2013-07-01 NOTE — Addendum Note (Signed)
Addended by: Bufford Spikes on: 07/01/2013 09:00 AM   Modules accepted: Orders

## 2013-07-02 ENCOUNTER — Ambulatory Visit: Payer: No Typology Code available for payment source

## 2013-07-08 ENCOUNTER — Other Ambulatory Visit: Payer: Self-pay | Admitting: Internal Medicine

## 2013-07-13 ENCOUNTER — Other Ambulatory Visit: Payer: Self-pay | Admitting: Internal Medicine

## 2013-07-14 NOTE — Telephone Encounter (Signed)
Tramadol rx called to [facility]. 

## 2013-07-14 NOTE — Telephone Encounter (Signed)
Refill approved - nurse to call in. 

## 2013-07-14 NOTE — Telephone Encounter (Signed)
Please find out if the patient is taking this medication and needs a refill.

## 2013-07-14 NOTE — Telephone Encounter (Signed)
Pt states he's still taking Tramadol for back pain.

## 2013-07-21 ENCOUNTER — Encounter: Payer: Self-pay | Admitting: Family Medicine

## 2013-07-21 ENCOUNTER — Encounter: Payer: Self-pay | Admitting: Internal Medicine

## 2013-07-21 ENCOUNTER — Ambulatory Visit (INDEPENDENT_AMBULATORY_CARE_PROVIDER_SITE_OTHER): Payer: No Typology Code available for payment source | Admitting: Family Medicine

## 2013-07-21 ENCOUNTER — Ambulatory Visit (INDEPENDENT_AMBULATORY_CARE_PROVIDER_SITE_OTHER): Payer: No Typology Code available for payment source | Admitting: Internal Medicine

## 2013-07-21 VITALS — BP 137/97 | HR 72 | Temp 97.0°F | Wt 181.7 lb

## 2013-07-21 VITALS — BP 127/86 | HR 73 | Ht 69.0 in | Wt 181.0 lb

## 2013-07-21 DIAGNOSIS — IMO0002 Reserved for concepts with insufficient information to code with codable children: Secondary | ICD-10-CM

## 2013-07-21 DIAGNOSIS — G8929 Other chronic pain: Secondary | ICD-10-CM

## 2013-07-21 DIAGNOSIS — R361 Hematospermia: Secondary | ICD-10-CM

## 2013-07-21 DIAGNOSIS — M5416 Radiculopathy, lumbar region: Secondary | ICD-10-CM

## 2013-07-21 NOTE — Patient Instructions (Addendum)
1. Please keep your appointment with Sports Medicine in [facility] this afternoon for back pain. 2. Continue tramadol as needed for back pain.

## 2013-07-21 NOTE — Progress Notes (Signed)
[name], male   DOB: [birth year], 52 y.o.   MRN: [id number]   HPI: [name] is a 52 y.o. AAM here for a follow-up visit for hematospermia and chronic radicular low back pain.  He denies any more episodes of hermatospermia since his previous visit.  Additionally, he finished physical therapy for his back pain and still experiences pain 1-2x/week.  He states that the tramadol helps with pain.  A referral was made to Sports Medicine and he has an appointment this afternoon in the [facility] office.  He was requesting an injection for his pain and states that he received one here in the past.  I discussed this with the patient that a referral to Sports Medicine would be his best option at this point.  He has no other complaints today except that he is trying to get disability and asked that I sign papers regarding this.  However, he will need to see a physician with the disability office regarding this matter as we do not handle this sort of thing.    Past Medical History  Diagnosis Date  . Diabetes mellitus   . Neuromuscular disorder   . Pancreatitis 01/09/2012  . Hypertension goal BP (blood pressure) < 140/80   . Hepatitis C     [id number]   Current Outpatient Prescriptions  Medication Sig Dispense Refill  . Blood Glucose Monitoring Suppl (BLOOD GLUCOSE METER) kit Use up to 1x/day dx code 250.00  1 each  0  . chlorpheniramine (CHLOR-TRIMETON) 4 MG tablet Take 1 tablet (4 mg total) by mouth 2 (two) times daily as needed for allergies.  14 tablet  0  . cyclobenzaprine (FLEXERIL) 10 MG tablet Take 1 tablet (10 mg total) by mouth 2 (two) times daily as needed for muscle spasms.  20 tablet  0  . fluticasone (FLONASE) 50 MCG/ACT nasal spray Place 2 sprays into the nose daily.  16 g  0  . glucose blood (WAVESENSE PRESTO) test strip Use up to 1x/day dx code 250.00  100 each  12  . glucose blood test strip Use to check blood sugar up to 3 times a day before meals Dx code- 250.00  100 each   12  . lisinopril (PRINIVIL,ZESTRIL) 5 MG tablet Take 1 tablet (5 mg total) by mouth daily.  30 tablet  3  . metFORMIN (GLUCOPHAGE) 1000 MG tablet Take 1 tablet (1,000 mg total) by mouth 2 (two) times daily.  60 tablet  3  . naproxen (NAPROSYN) 500 MG tablet Take 1 tablet (500 mg total) by mouth 2 (two) times daily with a meal.  60 tablet  0  . omeprazole (PRILOSEC) 20 MG capsule Take 1 capsule (20 mg total) by mouth daily.  30 capsule  3  . traMADol (ULTRAM) 50 MG tablet Take 1 tablet (50 mg total) by mouth every 6 (six) hours as needed for pain.  40 tablet  0   No current facility-administered medications for this visit.   No family history on file. History   Social History  . Marital Status: Single    Spouse Name: N/A    Number of Children: N/A  . Years of Education: N/A   Social History Main Topics  . Smoking status: Current Some Day Smoker -- 0.10 packs/day    Types: Cigarettes    Last Attempt to Quit: 01/05/2012  . Smokeless tobacco: Never Used  . Alcohol Use: 3.0 oz/week    5 Cans of beer per week  .  Drug Use: No  . Sexually Active: Yes    Birth Control/ Protection: None   Other Topics Concern  . None   Social History Narrative  . None    Review of Systems: Constitutional: Denies fever, chills, diaphoresis, appetite change and fatigue.  Respiratory: Denies SOB, DOE, cough, chest tightness, and wheezing.  Cardiovascular: No chest pain, palpitations and leg swelling.  Gastrointestinal: No abdominal pain, nausea, vomiting, bloody stools Genitourinary: No dysuria, frequency, hematuria, or flank pain.  Musculoskeletal: No myalgias, back pain, joint swelling, arthralgias    Objective:  Physical Exam: Filed Vitals:   07/21/13 0839  BP: 137/97  Pulse: 72  Temp: 97 F (36.1 C)  TempSrc: Oral  Weight: 181 lb 11.2 oz (82.419 kg)  SpO2: 99%   General: Well nourished. No acute distress.  Lungs: CTA bilaterally. Heart: RRR; no extra sounds or murmurs  Abdomen:  Non-distended, normal BS, soft, nontender; no hepatosplenomegaly  Extremities: No pedal edema. No joint swelling or tenderness. Neurologic: Alert and oriented x3. No obvious neurologic deficits.  Assessment & Plan:  I have discussed my assessment and plan  with Dr. Criselda Peaches  as detailed under problem based charting.

## 2013-07-21 NOTE — Patient Instructions (Addendum)
Your pain is most likely due to diabetic polyradiculopathy. This would explain why physical therapy has not had a lot of success in curing you. Continue with medicines and treatment by your family doctor and keeping you A1c at 7.0 or less. Nerve blocking medicines are likely to be helpful (lyrica, neurontin, notriptyline) but are expensive. You do have a small disc bulge in your back that may be contributing to this. We will trial an injection here (called an ESI) to see if you get relief. This usually takes a few days to kick in. Continue with home exercises in meantime. Continue tramadol as per your family doctor. Follow up with me in 1 month for reevaluation.

## 2013-07-21 NOTE — Assessment & Plan Note (Signed)
Pt denies experiencing any additional episodes of hematospermia.  He will let me know if he has future episodes.

## 2013-07-21 NOTE — Assessment & Plan Note (Signed)
An appt was made to Sports Medicine and he his appt is this afternoon at their [facility] office.  He is currently taking tramadol for pain and will continue as needed.  He is asking me to sign disability papers but he will have to see the disability office regarding this.

## 2013-07-22 NOTE — Progress Notes (Signed)
I saw and evaluated the patient.  I personally confirmed the key portions of the history and exam documented by Dr. Gill and I reviewed pertinent patient test results.  The assessment, diagnosis, and plan were formulated together and I agree with the documentation in the resident's note. 

## 2013-07-23 ENCOUNTER — Encounter: Payer: Self-pay | Admitting: Family Medicine

## 2013-07-23 NOTE — Progress Notes (Signed)
[name], male   DOB: 1961-10-07, 52 y.o.   MRN: [id number]  PCP: Boykin Peek, MD  Subjective:   HPI: Patient is a 52 y.o. male here for low back pain.  Patient reports over 7 years of low back pain. Seemed to be associated with when his diabetes started. He reports the pain radiates down into both legs but worse on the right. Associated with numbness, tingling. No bowel/bladder dysfunction. Reports having done PT for about 4 months - seemed to help the left side but not the right. Has tried pain medication, anti-inflammatories, muscle relaxants, gabapentin MRI from 06/2012 showed mild disc bulge at L4-5 with some foraminal encroachment bilaterally. TENS unit helped some. Sed rate was 1 a year ago. Last a1c was 7.0 Had nerve conduction studies/EMGs done last year as well which showed a polyradiculopathy and polyneuropathy - consistent with diabetic amyotrophy  Past Medical History  Diagnosis Date  . Diabetes mellitus   . Neuromuscular disorder   . Pancreatitis 01/09/2012  . Hypertension goal BP (blood pressure) < 140/80   . Hepatitis C     [id number]    Current Outpatient Prescriptions on File Prior to Visit  Medication Sig Dispense Refill  . Blood Glucose Monitoring Suppl (BLOOD GLUCOSE METER) kit Use up to 1x/day dx code 250.00  1 each  0  . chlorpheniramine (CHLOR-TRIMETON) 4 MG tablet Take 1 tablet (4 mg total) by mouth 2 (two) times daily as needed for allergies.  14 tablet  0  . cyclobenzaprine (FLEXERIL) 10 MG tablet Take 1 tablet (10 mg total) by mouth 2 (two) times daily as needed for muscle spasms.  20 tablet  0  . fluticasone (FLONASE) 50 MCG/ACT nasal spray Place 2 sprays into the nose daily.  16 g  0  . glucose blood (WAVESENSE PRESTO) test strip Use up to 1x/day dx code 250.00  100 each  12  . glucose blood test strip Use to check blood sugar up to 3 times a day before meals Dx code- 250.00  100 each  12  . lisinopril (PRINIVIL,ZESTRIL) 5 MG tablet  Take 1 tablet (5 mg total) by mouth daily.  30 tablet  3  . metFORMIN (GLUCOPHAGE) 1000 MG tablet Take 1 tablet (1,000 mg total) by mouth 2 (two) times daily.  60 tablet  3  . naproxen (NAPROSYN) 500 MG tablet Take 1 tablet (500 mg total) by mouth 2 (two) times daily with a meal.  60 tablet  0  . omeprazole (PRILOSEC) 20 MG capsule Take 1 capsule (20 mg total) by mouth daily.  30 capsule  3  . traMADol (ULTRAM) 50 MG tablet Take 1 tablet (50 mg total) by mouth every 6 (six) hours as needed for pain.  40 tablet  0   No current facility-administered medications on file prior to visit.    History reviewed. No pertinent past surgical history.  No Known Allergies  History   Social History  . Marital Status: Single    Spouse Name: N/A    Number of Children: N/A  . Years of Education: N/A   Occupational History  . Not on file.   Social History Main Topics  . Smoking status: Current Some Day Smoker -- 0.10 packs/day    Types: Cigarettes    Last Attempt to Quit: 01/05/2012  . Smokeless tobacco: Never Used  . Alcohol Use: 3.0 oz/week    5 Cans of beer per week  . Drug Use: No  . Sexually Active:  Yes    Birth Control/ Protection: None   Other Topics Concern  . Not on file   Social History Narrative  . No narrative on file    Family History  Problem Relation Age of Onset  . Diabetes Mother   . Hypertension Mother   . Hyperlipidemia Mother   . Diabetes Brother   . Hypertension Brother   . Heart attack Neg Hx   . Sudden death Neg Hx     BP 127/86  Pulse 73  Ht 5\' 9"  (1.753 m)  Wt 181 lb (82.101 kg)  BMI 26.72 kg/m2  Review of Systems: See HPI above.    Objective:  Physical Exam:  Gen: NAD  Back: No gross deformity, scoliosis. TTP right paraspinal region, buttock.  No midline or bony TTP.  No bony stepoffs. FROM. Strength LEs 5/5 all muscle groups.   2+ MSRs in patellar and achilles tendons, equal bilaterally. Negative SLRs. Sensation diminished below  mid-shin on right. Negative logroll bilateral hips Negative fabers and piriformis stretches.  Fabers symmetric.    Assessment & Plan:  1. Low back pain - prior workup, history, and exam all consistent with diabetic polyradiculopathy noted on NCV/EMGs he had last year.  Has done several months of PT with only mild benefit.  I don't think there is any other strengthening/stretching exercises he needs to do in addition to this based on his exam today.  He does have a small disc bulge in back with foraminal encroachment.  Advised could trial an ESI at this level on right for diagnostic and therapeutic purposes - he would like to try this.  Otherwise advised continued HEP, a home TENS unit, titrating neurontin to max dose, and keeping diabetes under control are most likely measures to help him.  Follow up in 1 month for reevaluation.

## 2013-07-23 NOTE — Assessment & Plan Note (Signed)
prior workup, history, and exam all consistent with diabetic polyradiculopathy noted on NCV/EMGs he had last year.  Has done several months of PT with only mild benefit.  I don't think there is any other strengthening/stretching exercises he needs to do in addition to this based on his exam today.  He does have a small disc bulge in back with foraminal encroachment.  Advised could trial an ESI at this level on right for diagnostic and therapeutic purposes - he would like to try this.  Otherwise advised continued HEP, a home TENS unit, titrating neurontin to max dose, and keeping diabetes under control are most likely measures to help him.  Follow up in 1 month for reevaluation.

## 2013-07-28 ENCOUNTER — Other Ambulatory Visit: Payer: Self-pay | Admitting: *Deleted

## 2013-07-28 ENCOUNTER — Encounter: Payer: Self-pay | Admitting: Internal Medicine

## 2013-07-28 ENCOUNTER — Ambulatory Visit (INDEPENDENT_AMBULATORY_CARE_PROVIDER_SITE_OTHER): Payer: No Typology Code available for payment source | Admitting: Internal Medicine

## 2013-07-28 VITALS — BP 125/92 | HR 79 | Temp 97.0°F | Wt 179.9 lb

## 2013-07-28 DIAGNOSIS — I10 Essential (primary) hypertension: Secondary | ICD-10-CM

## 2013-07-28 DIAGNOSIS — E1149 Type 2 diabetes mellitus with other diabetic neurological complication: Secondary | ICD-10-CM

## 2013-07-28 DIAGNOSIS — M549 Dorsalgia, unspecified: Secondary | ICD-10-CM

## 2013-07-28 DIAGNOSIS — G8929 Other chronic pain: Secondary | ICD-10-CM

## 2013-07-28 DIAGNOSIS — IMO0002 Reserved for concepts with insufficient information to code with codable children: Secondary | ICD-10-CM

## 2013-07-28 MED ORDER — LIDOCAINE 5 % EX PTCH: 1.0000 | MEDICATED_PATCH | CUTANEOUS | Status: DC

## 2013-07-28 NOTE — Assessment & Plan Note (Signed)
His MRI on 7/13 showed mild disc bulge at L4-L5. There is some foraminal encroachment. Patient has been followed up with sports medicine. He still has moderate back pain. There is no alarming symptoms. Patient has another appointment with the sports medicine in a month.  -will let patient continue current regimen (Neurontin, Flexeril and Mobic per sports medicine doctors note). - will add Lidoderm patch.

## 2013-07-28 NOTE — Patient Instructions (Addendum)
1. Please take all medications as prescribed. Please use Lidoderm patch for your back from now. 2. Please follow up with Sports Medicine for your back pain as scheduled. 3. If you have worsening of your symptoms or new symptoms arise, please call the clinic ([phone]), or go to the ER immediately if symptoms are severe.

## 2013-07-28 NOTE — Assessment & Plan Note (Signed)
BP Readings from Last 3 Encounters:  07/28/13 125/92  07/21/13 127/86  07/21/13 137/97    Lab Results  Component Value Date   NA 139 06/22/2013   K 4.1 06/22/2013   CREATININE 0.84 06/22/2013    Assessment: Blood pressure control: controlled Progress toward BP goal:  at goal Comments:   Plan: Medications:  continue current medications Educational resources provided: brochure Self management tools provided:   Other plans: Is well controlled. Will continue lisinopril 5 mg daily.

## 2013-07-28 NOTE — Assessment & Plan Note (Signed)
Lab Results  Component Value Date   HGBA1C 7.0 06/22/2013   HGBA1C 7.3 01/19/2013   HGBA1C 7.0 10/27/2012     Assessment: Diabetes control: good control (HgbA1C at goal) Progress toward A1C goal:  at goal Comments:   Plan: Medications:  continue current medications Home glucose monitoring: Frequency:   Timing:   Instruction/counseling given: reminded to bring blood glucose meter & log to each visit Educational resources provided: brochure Self management tools provided:   Other plans: Is well controlled. Will continue metformin 1000 mg twice a day.

## 2013-07-28 NOTE — Progress Notes (Signed)
[name], male   DOB: 1961/02/11, 52 y.o.   MRN: [id number] Subjective:   [name] male   DOB: 11-29-1961 52 y.o.   MRN: [id number]  CC:  Follow up visit. HPI:  [name] is a 52 y.o.    with past medical history as outlined below, who presents for a followup visit today  1. HTN: Is well controlled. Blood pressure is 125/92 today. Patient does not have chest pain, shortness of breath, palpitation or leg edema up t  2. DM-II: Diabetes is well controlled. A1c was 7.0 on 06/22/13. Patient does not have any symptoms for either hypo-or hyperglycemia.  3. Lower back Pain: Patient has chronic lower back pain full morbid 3 years. His MRI on 7/13 showed mild disc bulge at L4-L5. There is some foraminal encroachment. He just completed a physical therapy in last month. He has been followed up with the sports medicine. He is taking Neurontin, Flexeril and Mobic per sports medicine doctors note (I could not do medication reconciliation because patient cannot tell me exactly what medication he is taking at home). Patient is still have moderate lower back pain. His back pain is radiated down to the posterior thigh and the lower leg. There is no alarming symptoms, such as loss of control of bowel movement or urinary incontinence. Patient does not have weakness in his legs.   Patient has a 'Freedom Disability Form: with him and wants me to answer some questions on the form. I did it according to patient's answers and our record as follows :   -Operate motor vehicle: Occasionally -Tolerate heat: Occasionally -Tolerate cold: Occasionally -Tolerate dust, smoke or fumes exposure: Never -Tolerate noise exposure: Occasionally -Needed elevate legs during 8 hour work day: Never -Needed to use assistive devices to ambulate: Yes -In mild pain in the patient suffers from pain that is: Moderate -Objective signs of pain: Patient had MRI of lumbar spine on 07/15/12, which showed mild disc bulging  and spondylosis at L4-L5 with mild foraminal encroachment bilaterally.  - Claimant will need unscheduled interruptions for work routine to leave the work station to alleviated the pain during the days: Frequently - Limits we'll and probably needs work due to a suspicion for pain: Frequently -As the results of this condition and the attendant limitations, the claimant will probably will be: unreliable   Past Medical History  Diagnosis Date  . Diabetes mellitus   . Neuromuscular disorder   . Pancreatitis 01/09/2012  . Hypertension goal BP (blood pressure) < 140/80   . Hepatitis C     [id number]   Current Outpatient Prescriptions  Medication Sig Dispense Refill  . Blood Glucose Monitoring Suppl (BLOOD GLUCOSE METER) kit Use up to 1x/day dx code 250.00  1 each  0  . chlorpheniramine (CHLOR-TRIMETON) 4 MG tablet Take 1 tablet (4 mg total) by mouth 2 (two) times daily as needed for allergies.  14 tablet  0  . cyclobenzaprine (FLEXERIL) 10 MG tablet Take 1 tablet (10 mg total) by mouth 2 (two) times daily as needed for muscle spasms.  20 tablet  0  . fluticasone (FLONASE) 50 MCG/ACT nasal spray Place 2 sprays into the nose daily.  16 g  0  . glucose blood (WAVESENSE PRESTO) test strip Use up to 1x/day dx code 250.00  100 each  12  . glucose blood test strip Use to check blood sugar up to 3 times a day before meals Dx code- 250.00  100 each  12  .  lisinopril (PRINIVIL,ZESTRIL) 5 MG tablet Take 1 tablet (5 mg total) by mouth daily.  30 tablet  3  . metFORMIN (GLUCOPHAGE) 1000 MG tablet Take 1 tablet (1,000 mg total) by mouth 2 (two) times daily.  60 tablet  3  . naproxen (NAPROSYN) 500 MG tablet Take 1 tablet (500 mg total) by mouth 2 (two) times daily with a meal.  60 tablet  0  . omeprazole (PRILOSEC) 20 MG capsule Take 1 capsule (20 mg total) by mouth daily.  30 capsule  3  . traMADol (ULTRAM) 50 MG tablet Take 1 tablet (50 mg total) by mouth every 6 (six) hours as needed for pain.  40 tablet  0    No current facility-administered medications for this visit.   Family History  Problem Relation Age of Onset  . Diabetes Mother   . Hypertension Mother   . Hyperlipidemia Mother   . Diabetes Brother   . Hypertension Brother   . Heart attack Neg Hx   . Sudden death Neg Hx    History   Social History  . Marital Status: Single    Spouse Name: N/A    Number of Children: N/A  . Years of Education: N/A   Social History Main Topics  . Smoking status: Current Some Day Smoker -- 0.10 packs/day    Types: Cigarettes    Last Attempt to Quit: 01/05/2012  . Smokeless tobacco: Never Used  . Alcohol Use: 3.0 oz/week    5 Cans of beer per week  . Drug Use: No  . Sexually Active: Yes    Birth Control/ Protection: None   Other Topics Concern  . Not on file   Social History Narrative  . No narrative on file    Review of Systems: Full 14-point review of systems otherwise negative. See HPI.   Objective:  Physical Exam: There were no vitals filed for this visit. Constitutional: Vital signs reviewed.  Patient is a well-developed and well-nourished, in no acute distress and cooperative with exam.   HEENT:  Head: Normocephalic and atraumatic Ear: TM normal bilaterally Mouth: no erythema or exudates, MMM Eyes: PERRL, EOMI, conjunctivae normal, No scleral icterus.  Neck: Supple, Trachea midline normal ROM, No JVD,  Cardiovascular: RRR, S1 normal, S2 normal, no MRG, pulses symmetric and intact bilaterally Pulmonary/Chest: CTAB, no wheezes, rales, or rhonchi Abdominal: Soft. Non-tender, non-distended, bowel sounds are normal, no masses, organomegaly, or guarding present.  GU: no CVA tenderness Musculoskeletal: Patient has tenderness over the lower back at midline. There is limitation of lumbar spine movement due to pain. Leg raise test is negative on the L and questionably positive on the right side. Hematology: no cervical, inginal, or axillary adenopathy.  Neurological: A&O x3,  Strength is normal and symmetric bilaterally, cranial nerve II-XII are grossly intact, no focal motor deficit, sensory intact to light touch bilaterally. Brachial reflex 2+ bilaterally. Knee reflex 2+ bilaterally.  Skin: Warm, dry and intact. No rash, cyanosis, or clubbing.  Psychiatric: Normal mood and affect. speech and behavior is normal. Judgment and thought content normal. Cognition and memory are normal.   Assessment & Plan:

## 2013-07-29 ENCOUNTER — Other Ambulatory Visit: Payer: Self-pay | Admitting: Internal Medicine

## 2013-07-29 DIAGNOSIS — M549 Dorsalgia, unspecified: Secondary | ICD-10-CM

## 2013-07-29 DIAGNOSIS — K219 Gastro-esophageal reflux disease without esophagitis: Secondary | ICD-10-CM

## 2013-07-31 NOTE — Progress Notes (Signed)
Case discussed with Dr. Niu soon after the resident saw the patient.  We reviewed the resident's history and exam and pertinent patient test results.  I agree with the assessment, diagnosis, and plan of care documented in the resident's note. 

## 2013-08-04 ENCOUNTER — Other Ambulatory Visit: Payer: Self-pay | Admitting: Family Medicine

## 2013-08-04 DIAGNOSIS — M549 Dorsalgia, unspecified: Secondary | ICD-10-CM

## 2013-08-05 ENCOUNTER — Ambulatory Visit
Admission: RE | Admit: 2013-08-05 | Discharge: 2013-08-05 | Disposition: A | Discharge status: Home / Self Care | Payer: No Typology Code available for payment source | Source: Ambulatory Visit | Attending: Family Medicine | Admitting: Family Medicine

## 2013-08-05 DIAGNOSIS — M549 Dorsalgia, unspecified: Secondary | ICD-10-CM

## 2013-08-05 MED ORDER — IOHEXOL 180 MG/ML  SOLN
1.0000 mL | Freq: Once | INTRAMUSCULAR | Status: AC | PRN
  Administered 2013-08-05: 1 mL via EPIDURAL

## 2013-08-05 MED ORDER — METHYLPREDNISOLONE ACETATE 40 MG/ML INJ SUSP (RADIOLOG
120.0000 mg | Freq: Once | INTRAMUSCULAR | Status: AC
  Administered 2013-08-05: 120 mg via EPIDURAL

## 2013-08-07 ENCOUNTER — Other Ambulatory Visit: Payer: No Typology Code available for payment source

## 2013-08-10 NOTE — Progress Notes (Signed)
Called patient back after he callled [place] to report inability to have an erection Saturday night while with his girlfriend.  He states he has NEVER had this problem, EVER!  Patient is s/p L4-5 ESI 08/05/13 with Dr. Bonnielee Haff.  Consulted Dr. Carlota Raspberry (today's spine physician) who asked me to pass on to patient that this probably is a temporary side effect of the steroids from the [facility].  Patient understands and will call us later in the week if the problem continues.  jkl

## 2013-08-12 ENCOUNTER — Emergency Department (HOSPITAL_COMMUNITY)
Admission: EM | Admit: 2013-08-12 | Discharge: 2013-08-12 | Disposition: A | Discharge status: Home / Self Care | Payer: No Typology Code available for payment source | Attending: Emergency Medicine | Admitting: Emergency Medicine

## 2013-08-12 DIAGNOSIS — K861 Other chronic pancreatitis: Secondary | ICD-10-CM | POA: Insufficient documentation

## 2013-08-12 DIAGNOSIS — IMO0002 Reserved for concepts with insufficient information to code with codable children: Secondary | ICD-10-CM | POA: Insufficient documentation

## 2013-08-12 DIAGNOSIS — E119 Type 2 diabetes mellitus without complications: Secondary | ICD-10-CM | POA: Insufficient documentation

## 2013-08-12 DIAGNOSIS — R1013 Epigastric pain: Secondary | ICD-10-CM | POA: Insufficient documentation

## 2013-08-12 DIAGNOSIS — Z791 Long term (current) use of non-steroidal anti-inflammatories (NSAID): Secondary | ICD-10-CM | POA: Insufficient documentation

## 2013-08-12 DIAGNOSIS — IMO0001 Reserved for inherently not codable concepts without codable children: Secondary | ICD-10-CM | POA: Insufficient documentation

## 2013-08-12 DIAGNOSIS — R739 Hyperglycemia, unspecified: Secondary | ICD-10-CM

## 2013-08-12 DIAGNOSIS — R112 Nausea with vomiting, unspecified: Secondary | ICD-10-CM | POA: Insufficient documentation

## 2013-08-12 DIAGNOSIS — F17200 Nicotine dependence, unspecified, uncomplicated: Secondary | ICD-10-CM | POA: Insufficient documentation

## 2013-08-12 DIAGNOSIS — Z8719 Personal history of other diseases of the digestive system: Secondary | ICD-10-CM | POA: Insufficient documentation

## 2013-08-12 DIAGNOSIS — G8929 Other chronic pain: Secondary | ICD-10-CM | POA: Insufficient documentation

## 2013-08-12 DIAGNOSIS — H538 Other visual disturbances: Secondary | ICD-10-CM | POA: Insufficient documentation

## 2013-08-12 DIAGNOSIS — R5381 Other malaise: Secondary | ICD-10-CM | POA: Insufficient documentation

## 2013-08-12 DIAGNOSIS — Z8619 Personal history of other infectious and parasitic diseases: Secondary | ICD-10-CM | POA: Insufficient documentation

## 2013-08-12 DIAGNOSIS — I10 Essential (primary) hypertension: Secondary | ICD-10-CM | POA: Insufficient documentation

## 2013-08-12 DIAGNOSIS — M549 Dorsalgia, unspecified: Secondary | ICD-10-CM | POA: Insufficient documentation

## 2013-08-12 DIAGNOSIS — Z79899 Other long term (current) drug therapy: Secondary | ICD-10-CM | POA: Insufficient documentation

## 2013-08-12 DIAGNOSIS — Z8669 Personal history of other diseases of the nervous system and sense organs: Secondary | ICD-10-CM | POA: Insufficient documentation

## 2013-08-12 LAB — URINE MICROSCOPIC-ADD ON

## 2013-08-12 LAB — URINALYSIS, ROUTINE W REFLEX MICROSCOPIC
Glucose, UA: >1000 mg/dL — AB
Ketones, ur: NEGATIVE mg/dL
Leukocytes, UA: NEGATIVE
pH: 6 (ref 5.0–8.0)

## 2013-08-12 LAB — CBC WITH DIFFERENTIAL/PLATELET
Basophils Absolute: 0 10*3/uL (ref 0.0–0.1)
Basophils Relative: 1 % (ref 0–1)
MCHC: 33 g/dL (ref 30.0–36.0)
Monocytes Absolute: 0.9 10*3/uL (ref 0.1–1.0)
Neutro Abs: 4.3 10*3/uL (ref 1.7–7.7)
Neutrophils Relative %: 51 % (ref 43–77)
Platelets: 208 10*3/uL (ref 150–400)
RDW: 14.9 % (ref 11.5–15.5)

## 2013-08-12 LAB — COMPREHENSIVE METABOLIC PANEL
AST: 30 U/L (ref 0–37)
Albumin: 3.4 g/dL — ABNORMAL LOW (ref 3.5–5.2)
Chloride: 99 mEq/L (ref 96–112)
Creatinine, Ser: 0.6 mg/dL (ref 0.50–1.35)
Total Bilirubin: 0.2 mg/dL — ABNORMAL LOW (ref 0.3–1.2)

## 2013-08-12 LAB — GLUCOSE, CAPILLARY: Glucose-Capillary: 232 mg/dL — ABNORMAL HIGH (ref 70–99)

## 2013-08-12 MED ORDER — ONDANSETRON 4 MG PO TBDP: ORAL_TABLET | ORAL | Status: DC

## 2013-08-12 MED ORDER — GI COCKTAIL ~~LOC~~
30.0000 mL | Freq: Once | ORAL | Status: AC
  Administered 2013-08-12: 30 mL via ORAL
  Filled 2013-08-12: qty 30

## 2013-08-12 MED ORDER — TRAMADOL HCL 50 MG PO TABS: 50.0000 mg | ORAL_TABLET | Freq: Four times a day (QID) | ORAL | Status: DC | PRN

## 2013-08-12 MED ORDER — INSULIN ASPART 100 UNIT/ML ~~LOC~~ SOLN
10.0000 [IU] | Freq: Once | SUBCUTANEOUS | Status: AC
  Administered 2013-08-12: 10 [IU] via SUBCUTANEOUS
  Filled 2013-08-12: qty 1

## 2013-08-12 MED ORDER — ONDANSETRON 4 MG PO TBDP
4.0000 mg | ORAL_TABLET | Freq: Once | ORAL | Status: AC
  Administered 2013-08-12: 4 mg via ORAL
  Filled 2013-08-12: qty 1

## 2013-08-12 NOTE — ED Notes (Signed)
Notified RN of CBG 292

## 2013-08-12 NOTE — ED Provider Notes (Signed)
CSN: [id number]     Arrival date & time 08/12/13  0818 History   First MD Initiated Contact with Patient 08/12/13 0820     Chief Complaint  Patient presents with  . Hyperglycemia   (Consider location/radiation/quality/duration/timing/severity/associated sxs/prior Treatment) HPI Patient states he was given a injection one week ago for his chronic back pain assumed to be a steroid. Since then he has had elevation in his blood sugar ranging in 300-400's. States he has had generalized fatigue and worsening epigastric pain with occasional nausea. No fevers or chills. No dark or melanotic stools. Has a history of gastritis and states his abdominal pain feels similar to his gastritis. Patient states that he has been compliant with his diabetes medication. He has also been taking naproxen 500 mg twice a day for his back. Past Medical History  Diagnosis Date  . Diabetes mellitus   . Neuromuscular disorder   . Pancreatitis 01/09/2012  . Hypertension goal BP (blood pressure) < 140/80   . Hepatitis C     [id number]   No past surgical history on file. Family History  Problem Relation Age of Onset  . Diabetes Mother   . Hypertension Mother   . Hyperlipidemia Mother   . Diabetes Brother   . Hypertension Brother   . Heart attack Neg Hx   . Sudden death Neg Hx    History  Substance Use Topics  . Smoking status: Current Some Day Smoker -- 0.10 packs/day    Types: Cigarettes    Last Attempt to Quit: 01/05/2012  . Smokeless tobacco: Never Used  . Alcohol Use: 3.0 oz/week    5 Cans of beer per week    Review of Systems  Constitutional: Positive for fatigue. Negative for fever and chills.  HENT: Negative for neck pain.   Eyes: Positive for visual disturbance (blurred vision).  Respiratory: Negative for cough and shortness of breath.   Gastrointestinal: Positive for nausea, vomiting and abdominal pain. Negative for diarrhea, constipation and blood in stool.  Genitourinary: Negative for dysuria,  frequency, flank pain and enuresis.  Musculoskeletal: Positive for myalgias and back pain.  Skin: Negative for rash and wound.  Neurological: Positive for weakness (generalized). Negative for dizziness, light-headedness, numbness and headaches.    Allergies  Review of patient's allergies indicates no known allergies.  Home Medications   Current Outpatient Rx  Name  Route  Sig  Dispense  Refill  . cyclobenzaprine (FLEXERIL) 10 MG tablet   Oral   Take 10 mg by mouth 2 (two) times daily as needed for muscle spasms.         . fluticasone (FLONASE) 50 MCG/ACT nasal spray   Nasal   Place 2 sprays into the nose daily.   16 g   0   . gabapentin (NEURONTIN) 300 MG capsule   Oral   Take 600 mg by mouth 2 (two) times daily.         Marland Kitchen lisinopril (PRINIVIL,ZESTRIL) 5 MG tablet   Oral   Take 1 tablet (5 mg total) by mouth daily.   30 tablet   3   . meloxicam (MOBIC) 7.5 MG tablet   Oral   Take 7.5 mg by mouth 2 (two) times daily as needed for pain (take 1 tablet every day, take secod tablet if needed).         . metFORMIN (GLUCOPHAGE) 1000 MG tablet   Oral   Take 1 tablet (1,000 mg total) by mouth 2 (two) times daily.   60  tablet   3   . naproxen (NAPROSYN) 500 MG tablet   Oral   Take 1 tablet (500 mg total) by mouth 2 (two) times daily with a meal.   60 tablet   0   . omeprazole (PRILOSEC) 20 MG capsule   Oral   Take 20 mg by mouth daily.         . traMADol (ULTRAM) 50 MG tablet   Oral   Take 1 tablet (50 mg total) by mouth every 6 (six) hours as needed for pain.   40 tablet   0    BP 135/97  Pulse 87  Temp(Src) 98.2 F (36.8 C) (Oral)  Resp 18  SpO2 99% Physical Exam  Nursing note and vitals reviewed. Constitutional: He is oriented to person, place, and time. He appears well-developed and well-nourished. No distress.  HENT:  Head: Normocephalic and atraumatic.  Mouth/Throat: Oropharynx is clear and moist. No oropharyngeal exudate.  Eyes: EOM are  normal. Pupils are equal, round, and reactive to light.  Neck: Normal range of motion. Neck supple.  Cardiovascular: Normal rate and regular rhythm.   Pulmonary/Chest: Effort normal and breath sounds normal. No respiratory distress. He has no wheezes. He has no rales. He exhibits no tenderness.  Abdominal: Soft. Bowel sounds are normal. He exhibits no distension and no mass. There is tenderness (mild epigastric tenderness to palpation). There is no rebound and no guarding.  Musculoskeletal: Normal range of motion. He exhibits no edema and no tenderness.  Neurological: He is alert and oriented to person, place, and time.  Pt is alert and oriented. Speech is clear and goal-oriented. CN II-XII are intact with no facial asymmetry. Pt has 5/5 motor in all extremeties. Sensation to light touch is intact.   Skin: Skin is warm and dry. No rash noted. No erythema.  Psychiatric: He has a normal mood and affect. His behavior is normal.    ED Course  Procedures (including critical care time) Labs Review Labs Reviewed  GLUCOSE, CAPILLARY - Abnormal; Notable for the following:    Glucose-Capillary 292 (*)    All other components within normal limits   Imaging Review No results found.  MDM  Elevated blood glucose recently may be due to steroid injection. We'll do screening labs and give subcutaneous insulin. Neuro exam is normal. We'll advise decreasing amount of NSAID usage. No evidence of gastrointestinal bleeding. If persistent epigastric pain, referred to gastroenterology. Patient is advised to followup with his primary doctor if his blood glucose remains elevated. Return precautions have been given. Patient understands.    Loren Racer, MD 08/13/13 [phone]

## 2013-08-12 NOTE — ED Notes (Signed)
Pt reports that he received a "shot" 4 days ago and has had difficulty with his blood sugars since.  Reported a CBG of 390 this am.  He is alert and oriented no distress.  C/o mild blurred vision and general malaise.

## 2013-08-24 ENCOUNTER — Encounter: Payer: Self-pay | Admitting: Family Medicine

## 2013-08-24 ENCOUNTER — Ambulatory Visit: Payer: No Typology Code available for payment source | Admitting: Family Medicine

## 2013-08-24 ENCOUNTER — Ambulatory Visit (INDEPENDENT_AMBULATORY_CARE_PROVIDER_SITE_OTHER): Payer: No Typology Code available for payment source | Admitting: Family Medicine

## 2013-08-24 VITALS — BP 121/84 | HR 98 | Ht 69.0 in | Wt 180.0 lb

## 2013-08-24 DIAGNOSIS — M545 Low back pain: Secondary | ICD-10-CM

## 2013-08-24 NOTE — Addendum Note (Signed)
Addended by: Lenda Kelp on: 08/24/2013 02:08 PM   Modules accepted: Orders

## 2013-08-24 NOTE — Patient Instructions (Addendum)
Start physical therapy for your low back. We will also refer you to the PM&R physicians for diabetic polyradiculopathy to see if they have any other suggestions on treatment. I don't think your pain is due to the bulging disc in your back. Follow up with me as needed.

## 2013-08-24 NOTE — Assessment & Plan Note (Signed)
prior workup, history, and exam all consistent with diabetic polyradiculopathy noted on NCV/EMGs he had last year.  Diagnostic and therapeutic ESI where he has a small disc bulge lumbar spine did not provide relief confirming the polyradiculopathy doesn't have a discogenic component.  Will start him in formal PT for lumbar pain, spasms.  Will refer to PM&R as well for further evaluation, treatment.  Beyond PT given his pain is not discogenic or muscular primarily do not think we have much further to offer besides PT.  Follow up with [place] as needed.  Advised his blood sugars should continue to come back to pre-injection baseline (typically only elevated about a week following injection).  Injection was not given at S2-4 levels to cause concern for permanent nerve damage/disruption and sexual dysfunction from the injection.  This should improve as well unless this is due coincidentally to his longstanding diabetes.

## 2013-08-24 NOTE — Progress Notes (Signed)
[name], male   DOB: 1961/11/23, 52 y.o.   MRN: [id number]  PCP: Lorretta Harp, MD  Subjective:   HPI: Patient is a 52 y.o. male here for f/u low back pain.  8/5: Patient reports over 7 years of low back pain. Seemed to be associated with when his diabetes started. He reports the pain radiates down into both legs but worse on the right. Associated with numbness, tingling. No bowel/bladder dysfunction. Reports having done PT for about 4 months - seemed to help the left side but not the right. Has tried pain medication, anti-inflammatories, muscle relaxants, gabapentin MRI from 06/2012 showed mild disc bulge at L4-5 with some foraminal encroachment bilaterally. TENS unit helped some. Sed rate was 1 a year ago. Last a1c was 7.0 Had nerve conduction studies/EMGs done last year as well which showed a polyradiculopathy and polyneuropathy - consistent with diabetic amyotrophy  9/8: Patient returns stating injection did not help at all. In fact has caused him to have elevated blood sugars that are coming down now.  Also having problems getting an erection. Only positive difference is right buttock is not as painful. Still has pain in right side of low back and radiation into right > left legs. No bowel/bladder dysfunction or saddle anesthesia however.  Past Medical History  Diagnosis Date  . Diabetes mellitus   . Neuromuscular disorder   . Pancreatitis 01/09/2012  . Hypertension goal BP (blood pressure) < 140/80   . Hepatitis C     [id number]    Current Outpatient Prescriptions on File Prior to Visit  Medication Sig Dispense Refill  . cyclobenzaprine (FLEXERIL) 10 MG tablet Take 10 mg by mouth 2 (two) times daily as needed for muscle spasms.      . fluticasone (FLONASE) 50 MCG/ACT nasal spray Place 2 sprays into the nose daily.  16 g  0  . gabapentin (NEURONTIN) 300 MG capsule Take 600 mg by mouth 2 (two) times daily.      Marland Kitchen lisinopril (PRINIVIL,ZESTRIL) 5 MG tablet Take 1  tablet (5 mg total) by mouth daily.  30 tablet  3  . meloxicam (MOBIC) 7.5 MG tablet Take 7.5 mg by mouth 2 (two) times daily as needed for pain (take 1 tablet every day, take secod tablet if needed).      . metFORMIN (GLUCOPHAGE) 1000 MG tablet Take 1 tablet (1,000 mg total) by mouth 2 (two) times daily.  60 tablet  3  . naproxen (NAPROSYN) 500 MG tablet Take 1 tablet (500 mg total) by mouth 2 (two) times daily with a meal.  60 tablet  0  . omeprazole (PRILOSEC) 20 MG capsule Take 20 mg by mouth daily.      . ondansetron (ZOFRAN ODT) 4 MG disintegrating tablet 4mg  ODT q4 hours prn nausea/vomit  8 tablet  0  . traMADol (ULTRAM) 50 MG tablet Take 1 tablet (50 mg total) by mouth every 6 (six) hours as needed for pain.  40 tablet  0  . traMADol (ULTRAM) 50 MG tablet Take 1 tablet (50 mg total) by mouth every 6 (six) hours as needed for pain.  15 tablet  0   No current facility-administered medications on file prior to visit.    History reviewed. No pertinent past surgical history.  No Known Allergies  History   Social History  . Marital Status: Single    Spouse Name: N/A    Number of Children: N/A  . Years of Education: N/A   Occupational History  .  Not on file.   Social History Main Topics  . Smoking status: Current Some Day Smoker -- 0.10 packs/day    Types: Cigarettes    Last Attempt to Quit: 01/05/2012  . Smokeless tobacco: Never Used  . Alcohol Use: 3.0 oz/week    5 Cans of beer per week  . Drug Use: No  . Sexual Activity: Yes    Birth Control/ Protection: None   Other Topics Concern  . Not on file   Social History Narrative  . No narrative on file    Family History  Problem Relation Age of Onset  . Diabetes Mother   . Hypertension Mother   . Hyperlipidemia Mother   . Diabetes Brother   . Hypertension Brother   . Heart attack Neg Hx   . Sudden death Neg Hx     BP 121/84  Pulse 98  Ht 5\' 9"  (1.753 m)  Wt 180 lb (81.647 kg)  BMI 26.57 kg/m2  Review of  Systems: See HPI above.    Objective:  Physical Exam:  Gen: NAD  Back: No gross deformity, scoliosis. TTP right lumbar paraspinal region, buttock.  No midline or bony TTP.  No bony stepoffs. FROM. Strength LEs 5/5 all muscle groups.   2+ MSRs in patellar and achilles tendons, equal bilaterally. Negative SLRs. Sensation diminished below mid-shin on right. Negative logroll bilateral hips    Assessment & Plan:  1. Low back pain - prior workup, history, and exam all consistent with diabetic polyradiculopathy noted on NCV/EMGs he had last year.  Diagnostic and therapeutic ESI where he has a small disc bulge lumbar spine did not provide relief confirming the polyradiculopathy doesn't have a discogenic component.  Will start him in formal PT for lumbar pain, spasms.  Will refer to PM&R as well for further evaluation, treatment.  Beyond PT given his pain is not discogenic or muscular primarily do not think we have much further to offer besides PT.  Follow up with [place] as needed.  Advised his blood sugars should continue to come back to pre-injection baseline (typically only elevated about a week following injection).  Injection was not given at S2-4 levels to cause concern for permanent nerve damage/disruption and sexual dysfunction from the injection.  This should improve as well unless this is due coincidentally to his longstanding diabetes.

## 2013-08-31 ENCOUNTER — Ambulatory Visit: Payer: No Typology Code available for payment source | Attending: Internal Medicine

## 2013-08-31 DIAGNOSIS — M545 Low back pain, unspecified: Secondary | ICD-10-CM | POA: Insufficient documentation

## 2013-08-31 DIAGNOSIS — IMO0001 Reserved for inherently not codable concepts without codable children: Secondary | ICD-10-CM | POA: Insufficient documentation

## 2013-08-31 DIAGNOSIS — M2569 Stiffness of other specified joint, not elsewhere classified: Secondary | ICD-10-CM | POA: Insufficient documentation

## 2013-09-02 ENCOUNTER — Ambulatory Visit: Payer: No Typology Code available for payment source

## 2013-09-07 ENCOUNTER — Ambulatory Visit: Payer: No Typology Code available for payment source

## 2013-09-09 ENCOUNTER — Ambulatory Visit: Payer: No Typology Code available for payment source

## 2013-09-29 ENCOUNTER — Ambulatory Visit (INDEPENDENT_AMBULATORY_CARE_PROVIDER_SITE_OTHER): Payer: Medicare Other | Admitting: Internal Medicine

## 2013-09-29 ENCOUNTER — Encounter: Payer: Self-pay | Admitting: Internal Medicine

## 2013-09-29 VITALS — BP 137/90 | HR 78 | Temp 97.8°F | Wt 178.6 lb

## 2013-09-29 DIAGNOSIS — E1149 Type 2 diabetes mellitus with other diabetic neurological complication: Secondary | ICD-10-CM | POA: Diagnosis not present

## 2013-09-29 DIAGNOSIS — L259 Unspecified contact dermatitis, unspecified cause: Secondary | ICD-10-CM

## 2013-09-29 MED ORDER — GLUCOSE BLOOD VI STRP: ORAL_STRIP | Status: DC

## 2013-09-29 MED ORDER — DIPHENHYDRAMINE-ZINC ACETATE 1-0.1 % EX CREA: TOPICAL_CREAM | Freq: Three times a day (TID) | CUTANEOUS | Status: DC | PRN

## 2013-09-29 NOTE — Patient Instructions (Signed)
Please use Benadryl cream apply three times a day  If you develop fevers or chills, please call clinic for an appoitmen Please follow with your regular doctor   Rash A rash is a change in the color or feel of your skin. There are many different types of rashes. You may have other problems along with your rash. HOME CARE  Avoid the thing that caused your rash.  Do not scratch your rash.  You may take cools baths to help stop itching.  Only take medicines as told by your doctor.  Keep all doctor visits as told. GET HELP RIGHT AWAY IF:   Your pain, puffiness (swelling), or redness gets worse.  You have a fever.  You have new or severe problems.  You have body aches, watery poop (diarrhea), or you throw up (vomit).  Your rash is not better after 3 days. MAKE SURE YOU:   Understand these instructions.  Will watch your condition.  Will get help right away if you are not doing well or get worse. Document Released: 05/21/2008 Document Revised: 02/25/2012 Document Reviewed: 09/17/2011 [facility] Patient Information 2014 [place], [place].

## 2013-09-30 NOTE — Progress Notes (Signed)
[name], male   DOB: 02/23/1961, 52 y.o.   MRN: [id number]   Subjective:   HPI: [name] is a 52 y.o. gentleman with medical history of diabetes and hypertension  presents to the clinic for an acute visit with a rash in his legs.  The rash has been present for the last 2 days. He reports that he went out in the woods with his stepson on Monday, and that evening he noticed that he had an itchy rash on his legs. He has tried using alcohol without relief. The rash is also distributed to the trunk of his body. He has not tried any other medicines. He does not have other constitutional symptoms like fevers, chills, or rigors, or increased fatigue. No easy bruising. He denies insect bites. No one else in at home is suffering from similar symptoms. No new medications. No new cosmetics. He doesn't have known food or drug allergies.    Past Medical History  Diagnosis Date  . Diabetes mellitus   . Neuromuscular disorder   . Pancreatitis 01/09/2012  . Hypertension goal BP (blood pressure) < 140/80   . Hepatitis C     [id number]   Current Outpatient Prescriptions  Medication Sig Dispense Refill  . cyclobenzaprine (FLEXERIL) 10 MG tablet Take 10 mg by mouth 2 (two) times daily as needed for muscle spasms.      . diphenhydrAMINE-zinc acetate (BENADRYL) cream Apply topically 3 (three) times daily as needed for itching. Apply to itchy areas.  28.3 g  0  . fluticasone (FLONASE) 50 MCG/ACT nasal spray Place 2 sprays into the nose daily.  16 g  0  . gabapentin (NEURONTIN) 300 MG capsule Take 600 mg by mouth 2 (two) times daily.      Marland Kitchen glucose blood (WAVESENSE PRESTO) test strip Use up to 1x/day dx code 250.00  100 each  12  . lisinopril (PRINIVIL,ZESTRIL) 5 MG tablet Take 1 tablet (5 mg total) by mouth daily.  30 tablet  3  . meloxicam (MOBIC) 7.5 MG tablet Take 7.5 mg by mouth 2 (two) times daily as needed for pain (take 1 tablet every day, take secod tablet if needed).      . metFORMIN  (GLUCOPHAGE) 1000 MG tablet Take 1 tablet (1,000 mg total) by mouth 2 (two) times daily.  60 tablet  3  . naproxen (NAPROSYN) 500 MG tablet Take 1 tablet (500 mg total) by mouth 2 (two) times daily with a meal.  60 tablet  0  . omeprazole (PRILOSEC) 20 MG capsule Take 20 mg by mouth daily.      . ondansetron (ZOFRAN ODT) 4 MG disintegrating tablet 4mg  ODT q4 hours prn nausea/vomit  8 tablet  0  . traMADol (ULTRAM) 50 MG tablet Take 1 tablet (50 mg total) by mouth every 6 (six) hours as needed for pain.  40 tablet  0  . traMADol (ULTRAM) 50 MG tablet Take 1 tablet (50 mg total) by mouth every 6 (six) hours as needed for pain.  15 tablet  0   No current facility-administered medications for this visit.   Family History  Problem Relation Age of Onset  . Diabetes Mother   . Hypertension Mother   . Hyperlipidemia Mother   . Diabetes Brother   . Hypertension Brother   . Heart attack Neg Hx   . Sudden death Neg Hx    History   Social History  . Marital Status: Single    Spouse Name: N/A  Number of Children: N/A  . Years of Education: N/A   Social History Main Topics  . Smoking status: Current Some Day Smoker -- 0.10 packs/day    Types: Cigarettes    Last Attempt to Quit: 01/05/2012  . Smokeless tobacco: Never Used  . Alcohol Use: 3.0 oz/week    5 Cans of beer per week  . Drug Use: No  . Sexual Activity: Yes    Birth Control/ Protection: None   Other Topics Concern  . None   Social History Narrative  . None   Review of Systems: Constitutional: Denies fever, chills, diaphoresis, appetite change and fatigue.  Respiratory: Denies SOB, DOE, cough, chest tightness, and wheezing. Denies chest pain. Cardiovascular: No chest pain, palpitations and leg swelling.  Gastrointestinal: No abdominal pain, nausea, vomiting, bloody stools Genitourinary: No dysuria, frequency, hematuria, or flank pain.  Musculoskeletal: No myalgias, back pain, joint swelling, arthralgias  Psych: No  depression symptoms. No SI or SA.   Objective:  Physical Exam: Filed Vitals:   09/29/13 1617  BP: 137/90  Pulse: 78  Temp: 97.8 F (36.6 C)  TempSrc: Oral  Weight: 178 lb 9.6 oz (81.012 kg)  SpO2: 99%   General: Well nourished. No acute distress. Skin: Papular rash that is sparsely spread in his legs and trunk. The rash has increased redness. It does not involve his falls or palms. No petechia or ecchymosis. HEENT: Normal oral mucosa. MMM.  Lungs: CTA bilaterally. Heart: RRR; no extra sounds or murmurs  Abdomen: Non-distended, normal BS, soft, nontender; no hepatosplenomegaly  Extremities: No pedal edema. No joint swelling or tenderness. Neurologic: Normal EOM,  Alert and oriented x3. No obvious neurologic/cranial nerve deficits.  Assessment & Plan:  I have discussed my assessment and plan  with  my attending in the clinic, Dr. Josem Kaufmann as detailed under problem based charting.

## 2013-09-30 NOTE — Assessment & Plan Note (Signed)
Most likely cause of his rash is contact dermatitis likely from plants while he was in the woods. Without constitutional symptoms, I do not suspect tickborne diseases. The patient denies insect bites.  Plan. -Encouraged him to try Benadryl cream OTC. Avoid scratching -If the rash persists or if he develops fevers, chills, or any other symptoms to report back to the clinic for re-evaluation. -Advised him to avoid alcohol application since this is likely to cause increased skin dryness and itching.

## 2013-10-01 NOTE — Progress Notes (Signed)
Case discussed with Dr. Kazibwe at the time of the visit.  We reviewed the resident's history and exam and pertinent patient test results.  I agree with the assessment, diagnosis and plan of care documented in the resident's note. 

## 2013-10-12 ENCOUNTER — Other Ambulatory Visit: Payer: Self-pay | Admitting: *Deleted

## 2013-10-12 DIAGNOSIS — E1149 Type 2 diabetes mellitus with other diabetic neurological complication: Secondary | ICD-10-CM

## 2013-10-12 NOTE — Telephone Encounter (Signed)
States he has insurance now - needs new rx sent to [facility].  Thanks

## 2013-10-13 ENCOUNTER — Other Ambulatory Visit: Payer: Self-pay | Admitting: Internal Medicine

## 2013-10-13 DIAGNOSIS — E1149 Type 2 diabetes mellitus with other diabetic neurological complication: Secondary | ICD-10-CM

## 2013-10-13 MED ORDER — GLUCOSE BLOOD VI STRP: ORAL_STRIP | Status: DC

## 2013-10-13 NOTE — Telephone Encounter (Signed)
Yes, I sent the prescription to her pharmacy. Thanks.  Lorretta Harp, MD PGY3, Internal Medicine Teaching Service Pager: [phone]

## 2013-10-13 NOTE — Telephone Encounter (Signed)
Dr Clyde Lundborg, rx cannot be phone in to the pharmacy; can u send it electronically. Thanks

## 2013-10-19 ENCOUNTER — Encounter (HOSPITAL_COMMUNITY): Payer: Self-pay | Admitting: Emergency Medicine

## 2013-10-19 ENCOUNTER — Emergency Department (HOSPITAL_COMMUNITY)
Admission: EM | Admit: 2013-10-19 | Discharge: 2013-10-19 | Disposition: A | Discharge status: Home / Self Care | Payer: Medicare Other | Attending: Emergency Medicine | Admitting: Emergency Medicine

## 2013-10-19 ENCOUNTER — Other Ambulatory Visit: Payer: Self-pay | Admitting: *Deleted

## 2013-10-19 DIAGNOSIS — M546 Pain in thoracic spine: Secondary | ICD-10-CM | POA: Insufficient documentation

## 2013-10-19 DIAGNOSIS — Z79899 Other long term (current) drug therapy: Secondary | ICD-10-CM | POA: Insufficient documentation

## 2013-10-19 DIAGNOSIS — Z8619 Personal history of other infectious and parasitic diseases: Secondary | ICD-10-CM | POA: Diagnosis not present

## 2013-10-19 DIAGNOSIS — I10 Essential (primary) hypertension: Secondary | ICD-10-CM | POA: Diagnosis not present

## 2013-10-19 DIAGNOSIS — Z8669 Personal history of other diseases of the nervous system and sense organs: Secondary | ICD-10-CM | POA: Diagnosis not present

## 2013-10-19 DIAGNOSIS — Z8719 Personal history of other diseases of the digestive system: Secondary | ICD-10-CM | POA: Insufficient documentation

## 2013-10-19 DIAGNOSIS — F17200 Nicotine dependence, unspecified, uncomplicated: Secondary | ICD-10-CM | POA: Diagnosis not present

## 2013-10-19 DIAGNOSIS — G8929 Other chronic pain: Secondary | ICD-10-CM | POA: Diagnosis not present

## 2013-10-19 DIAGNOSIS — IMO0002 Reserved for concepts with insufficient information to code with codable children: Secondary | ICD-10-CM | POA: Diagnosis not present

## 2013-10-19 DIAGNOSIS — M545 Low back pain, unspecified: Secondary | ICD-10-CM | POA: Diagnosis not present

## 2013-10-19 DIAGNOSIS — Z791 Long term (current) use of non-steroidal anti-inflammatories (NSAID): Secondary | ICD-10-CM | POA: Insufficient documentation

## 2013-10-19 DIAGNOSIS — E119 Type 2 diabetes mellitus without complications: Secondary | ICD-10-CM

## 2013-10-19 DIAGNOSIS — K219 Gastro-esophageal reflux disease without esophagitis: Secondary | ICD-10-CM

## 2013-10-19 DIAGNOSIS — R209 Unspecified disturbances of skin sensation: Secondary | ICD-10-CM | POA: Diagnosis not present

## 2013-10-19 MED ORDER — GLUCOSE BLOOD VI STRP
ORAL_STRIP | Status: DC
Start: 2013-10-19 — End: 2014-10-22

## 2013-10-19 MED ORDER — KETOROLAC TROMETHAMINE 60 MG/2ML IM SOLN
60.0000 mg | Freq: Once | INTRAMUSCULAR | Status: AC
  Administered 2013-10-19: 60 mg via INTRAMUSCULAR
  Filled 2013-10-19: qty 2

## 2013-10-19 MED ORDER — ACCU-CHEK AVIVA PLUS W/DEVICE KIT: 1.0000 | PACK | Freq: Two times a day (BID) | Status: DC

## 2013-10-19 MED ORDER — OXYCODONE-ACETAMINOPHEN 5-325 MG PO TABS: 1.0000 | ORAL_TABLET | Freq: Four times a day (QID) | ORAL | Status: DC | PRN

## 2013-10-19 MED ORDER — OMEPRAZOLE 20 MG PO CPDR: 20.0000 mg | DELAYED_RELEASE_CAPSULE | Freq: Every day | ORAL | Status: DC

## 2013-10-19 NOTE — ED Notes (Signed)
Pt with chronic back pain and states the last three days that his medications are not helping. Denies recent trauma.

## 2013-10-19 NOTE — ED Provider Notes (Signed)
CSN: [id number]     Arrival date & time 10/19/13  0026 History   First MD Initiated Contact with Patient 10/19/13 0034     Chief Complaint  Patient presents with  . Back Pain   (Consider location/radiation/quality/duration/timing/severity/associated sxs/prior Treatment) Patient is a 52 y.o. male presenting with back pain. The history is provided by the patient. No language interpreter was used.  Back Pain Location:  Thoracic spine and lumbar spine Quality:  Aching and shooting Radiates to:  L posterior upper leg Pain severity:  Moderate Pain is:  Same all the time Onset quality:  Gradual Duration:  3 days Timing:  Constant Progression:  Worsening Chronicity:  Chronic Context: not falling, not jumping from heights and not recent injury   Relieved by:  Nothing Worsened by:  Movement and palpation Ineffective treatments: Naproxen, Tramadol, Gapapentin. Associated symptoms: numbness (chronic in b/l feet)   Associated symptoms: no bladder incontinence, no bowel incontinence, no dysuria, no fever, no perianal numbness and no weakness     Past Medical History  Diagnosis Date  . Diabetes mellitus   . Neuromuscular disorder   . Pancreatitis 01/09/2012  . Hypertension goal BP (blood pressure) < 140/80   . Hepatitis C     [id number]   No past surgical history on file. Family History  Problem Relation Age of Onset  . Diabetes Mother   . Hypertension Mother   . Hyperlipidemia Mother   . Diabetes Brother   . Hypertension Brother   . Heart attack Neg Hx   . Sudden death Neg Hx    History  Substance Use Topics  . Smoking status: Current Some Day Smoker -- 0.10 packs/day    Types: Cigarettes    Last Attempt to Quit: 01/05/2012  . Smokeless tobacco: Never Used  . Alcohol Use: 3.0 oz/week    5 Cans of beer per week    Review of Systems  Constitutional: Negative for fever.  Gastrointestinal: Negative for bowel incontinence.  Genitourinary: Negative for bladder incontinence and  dysuria.  Musculoskeletal: Positive for back pain.  Neurological: Positive for numbness (chronic in b/l feet). Negative for weakness.  All other systems reviewed and are negative.    Allergies  Review of patient's allergies indicates no known allergies.  Home Medications   Current Outpatient Rx  Name  Route  Sig  Dispense  Refill  . cyclobenzaprine (FLEXERIL) 10 MG tablet   Oral   Take 10 mg by mouth 2 (two) times daily as needed for muscle spasms.         . diphenhydrAMINE-zinc acetate (BENADRYL) cream   Topical   Apply topically 3 (three) times daily as needed for itching. Apply to itchy areas.   28.3 g   0   . fluticasone (FLONASE) 50 MCG/ACT nasal spray   Nasal   Place 2 sprays into the nose daily.   16 g   0   . gabapentin (NEURONTIN) 300 MG capsule   Oral   Take 600 mg by mouth 2 (two) times daily.         Marland Kitchen glucose blood (WAVESENSE PRESTO) test strip      Use up to 1x/day dx code 250.00   100 each   12   . lisinopril (PRINIVIL,ZESTRIL) 5 MG tablet   Oral   Take 1 tablet (5 mg total) by mouth daily.   30 tablet   3   . meloxicam (MOBIC) 7.5 MG tablet   Oral   Take 7.5 mg by mouth  2 (two) times daily as needed for pain (take 1 tablet every day, take secod tablet if needed).         . metFORMIN (GLUCOPHAGE) 1000 MG tablet   Oral   Take 1 tablet (1,000 mg total) by mouth 2 (two) times daily.   60 tablet   3   . naproxen (NAPROSYN) 500 MG tablet   Oral   Take 1 tablet (500 mg total) by mouth 2 (two) times daily with a meal.   60 tablet   0   . omeprazole (PRILOSEC) 20 MG capsule   Oral   Take 20 mg by mouth daily.         . ondansetron (ZOFRAN ODT) 4 MG disintegrating tablet      4mg  ODT q4 hours prn nausea/vomit   8 tablet   0   . traMADol (ULTRAM) 50 MG tablet   Oral   Take 1 tablet (50 mg total) by mouth every 6 (six) hours as needed for pain.   40 tablet   0   . traMADol (ULTRAM) 50 MG tablet   Oral   Take 1 tablet (50 mg  total) by mouth every 6 (six) hours as needed for pain.   15 tablet   0    BP 146/97  Pulse 83  Temp(Src) 97.6 F (36.4 C) (Oral)  Resp 16  Ht 5\' 9"  (1.753 m)  Wt 186 lb 9.6 oz (84.641 kg)  BMI 27.54 kg/m2  SpO2 98%  Physical Exam  Nursing note and vitals reviewed. Constitutional: He is oriented to person, place, and time. He appears well-developed and well-nourished. No distress.  HENT:  Head: Normocephalic and atraumatic.  Eyes: Conjunctivae and EOM are normal. No scleral icterus.  Neck: Normal range of motion.  Cardiovascular: Normal rate, regular rhythm and intact distal pulses.   Pulses:      Dorsalis pedis pulses are 2+ on the right side, and 2+ on the left side.       Posterior tibial pulses are 2+ on the right side, and 2+ on the left side.  Pulmonary/Chest: Effort normal. No respiratory distress.  Musculoskeletal: Normal range of motion.  + diffuse paraspinal muscle tenderness of upper and lower back. No bony deformities or step offs palpated.  Neurological: He is alert and oriented to person, place, and time. He has normal reflexes. No sensory deficit. GCS eye subscore is 4. GCS verbal subscore is 5. GCS motor subscore is 6.  Patient moves extremities without ataxia. Ambulatory with normal gait. Walks with cane at baseline.  Skin: Skin is warm and dry. No rash noted. He is not diaphoretic. No erythema. No pallor.  Psychiatric: He has a normal mood and affect. His behavior is normal.    ED Course  Procedures (including critical care time) Labs Review Labs Reviewed - No data to display Imaging Review No results found.  EKG Interpretation   None       MDM   1. Chronic back pain    Uncomplicated, chronic back pain. Pain is worsening over the last 3 days. Patient denies acute trauma or injury to his back. Patient is well and nontoxic appearing, hemodynamically stable, and afebrile on arrival. He is neurovascularly intact with normal strength and sensation in  all extremities. Patient is ambulatory with normal gait and walks with a cane at baseline. No red flags or signs concerning for cauda equina. Do not believe emergent imaging is indicated at this time. Patient treated in ED with IM  Toradol. Will discharge with prescription for 5 tablets of Percocet to take as needed for severe pain. Return precautions discussed and patient agreeable to plan with no unaddressed concerns    Antony Madura, PA-C 10/21/13 2021

## 2013-10-22 NOTE — ED Provider Notes (Signed)
Medical screening examination/treatment/procedure(s) were performed by non-physician practitioner and as supervising physician I was immediately available for consultation/collaboration.  EKG Interpretation   None         Dagmar Hait, MD 10/22/13 1539

## 2013-10-27 ENCOUNTER — Encounter: Payer: Self-pay | Admitting: Internal Medicine

## 2013-10-27 ENCOUNTER — Ambulatory Visit (INDEPENDENT_AMBULATORY_CARE_PROVIDER_SITE_OTHER): Payer: Medicare Other | Admitting: Internal Medicine

## 2013-10-27 VITALS — BP 152/90 | HR 68 | Temp 97.9°F | Ht 69.0 in | Wt 185.7 lb

## 2013-10-27 DIAGNOSIS — R059 Cough, unspecified: Secondary | ICD-10-CM | POA: Diagnosis not present

## 2013-10-27 DIAGNOSIS — M549 Dorsalgia, unspecified: Secondary | ICD-10-CM

## 2013-10-27 DIAGNOSIS — R05 Cough: Secondary | ICD-10-CM

## 2013-10-27 DIAGNOSIS — I10 Essential (primary) hypertension: Secondary | ICD-10-CM

## 2013-10-27 DIAGNOSIS — Z0000 Encounter for general adult medical examination without abnormal findings: Secondary | ICD-10-CM

## 2013-10-27 DIAGNOSIS — M545 Low back pain: Secondary | ICD-10-CM

## 2013-10-27 DIAGNOSIS — E1149 Type 2 diabetes mellitus with other diabetic neurological complication: Secondary | ICD-10-CM

## 2013-10-27 DIAGNOSIS — IMO0002 Reserved for concepts with insufficient information to code with codable children: Secondary | ICD-10-CM | POA: Diagnosis not present

## 2013-10-27 MED ORDER — HYDROCODONE-ACETAMINOPHEN 7.5-325 MG PO TABS: 1.0000 | ORAL_TABLET | Freq: Every evening | ORAL | Status: DC | PRN

## 2013-10-27 MED ORDER — LISINOPRIL 10 MG PO TABS: 10.0000 mg | ORAL_TABLET | Freq: Every day | ORAL | Status: DC

## 2013-10-27 MED ORDER — OXYCODONE-ACETAMINOPHEN 5-325 MG PO TABS: 1.0000 | ORAL_TABLET | Freq: Every evening | ORAL | Status: DC | PRN

## 2013-10-27 NOTE — Assessment & Plan Note (Signed)
Lab Results  Component Value Date   HGBA1C 7.4 09/29/2013   HGBA1C 7.0 06/22/2013   HGBA1C 7.3 01/19/2013     Assessment: Diabetes control: fair control Progress toward A1C goal:  deteriorated Comments:   Plan: Medications:  continue current medications Home glucose monitoring: Frequency:   Timing:   Instruction/counseling given: reminded to bring blood glucose meter & log to each visit Educational resources provided: brochure;handout Self management tools provided:   Other plans: will continue current regimen.

## 2013-10-27 NOTE — Assessment & Plan Note (Signed)
-  will do flu shot today -Patient refused colonoscopy in the past, he is FOBT was negative on 06/2013 -

## 2013-10-27 NOTE — Assessment & Plan Note (Signed)
BP Readings from Last 3 Encounters:  10/27/13 152/90  10/19/13 146/97  09/29/13 137/90    Lab Results  Component Value Date   NA 134* 08/12/2013   K 4.2 08/12/2013   CREATININE 0.60 08/12/2013    Assessment: Blood pressure control: mildly elevated Progress toward BP goal:  deteriorated Comments:  Plan: Medications:  Will increased his lisinopril dose from 5 mg to 10 mg daily Educational resources provided: brochure;handout;video Self management tools provided:   Other plans: Blood pressure is not well controlled. Will increased his lisinopril dose from 5 mg to 10 mg daily. Will reevaluate the patient in one month and check BMP for renal function.

## 2013-10-27 NOTE — Assessment & Plan Note (Addendum)
Patient has chronic lower back pain for more than 3 years. His MRI on 07/15/12 showed mild disc bulge at L4-L5 and some foraminal encroachment. He has been followed up with the sports medicine. His back pain is believed to due to diabetic polyradiculopathy per sports medicine. The small disc bulge may have also contributed. He failed the current treatment.  -will start narcotic for better pain control. Will start hydrocodone-acetaminophen 7.5/325, daily qhs. Will titrate up pain medication if his pain is not well controlled. -If Norco works well for pain control, will ask patient to sign pain medication contract in next visit. -Patient is aware of the pain medications side effects, such as constipation. -will continue gabapentin, Flexeril, Mobic.  -will d/c naproxen -Patient has some  tramadol pill at home. will not refill her tramadol after starting Norcor.

## 2013-10-27 NOTE — Patient Instructions (Addendum)
1. Please Norco (hydrocodone-acetaminophen), 1 tablet at bedtime daily. Please stop taking naproxen from now. 2. Please increase your lisinopril dosage from 5 mg to 10 mg daily from now.   3. If you have worsening of your symptoms or new symptoms arise, please call the clinic ([phone]), or go to the ER immediately if symptoms are severe.

## 2013-10-27 NOTE — Progress Notes (Signed)
[name], male   DOB: [birth year], 52 y.o.   MRN: [id number] Subjective:   [name] male   DOB: 06/02/1961 52 y.o.   MRN: [id number]  CC:   Follow up visit. HPI:  [name] is a 52 y.o. man with past medical history as outlined below, who presents for a followup visit today  1. HTN: Blood pressure is 152/90 today. Patient does not have chest pain, shortness of breath, palpitation or leg edema.   2. DM-II: Diabetes is fairly controlled. He is taking metformin 1000 mg twice a day.  A1c was 7.4 on 09/1413. Patient does not have any symptoms for either hypo-or hyperglycemia.  3. Lower back Pain: Patient has chronic lower back pain for more than 3 years. His MRI on 07/15/12 showed mild disc bulge at L4-L5 and some foraminal encroachment. He has been followed up with the sports medicine. His back pain is believed to due to diabetic polyradiculopathy per sports medicine. The small disc bulge may have also contributed. He had local injection by sports medicine, which relieved back pain only temporarily. Patient is taking 5 medications for his back pain, including gabapentin, Flexeril, naproxen, Mobic and tramadol, but his back pain is still not controlled. Patient also completed physical therapy which did not improve his back pain significantly. It seems that he failed the current treatment. Today, he still has severe back pain which is located at both lower back and upper back on the right side. There is no alarming symptoms, such as loss of control of bowel movement or urinary incontinence. Patient does not have weakness in his legs.  ROS:  Denies fever, chills, fatigue, headaches,  cough, chest pain, SOB,  abdominal pain, diarrhea, constipation, dysuria, urgency, frequency, hematuria.   Past Medical History  Diagnosis Date  . Diabetes mellitus   . Neuromuscular disorder   . Pancreatitis 01/09/2012  . Hypertension goal BP (blood pressure) < 140/80   . Hepatitis C    [id number]   Current Outpatient Prescriptions  Medication Sig Dispense Refill  . Blood Glucose Monitoring Suppl (ACCU-CHEK AVIVA PLUS) W/DEVICE KIT 1 each by Does not apply route 2 (two) times daily. To check blood sugar twice daily. diag code 250.60. Non insulin dependent  1 kit  0  . cyclobenzaprine (FLEXERIL) 10 MG tablet Take 10 mg by mouth 2 (two) times daily as needed for muscle spasms.      . diphenhydrAMINE-zinc acetate (BENADRYL) cream Apply topically 3 (three) times daily as needed for itching. Apply to itchy areas.  28.3 g  0  . fluticasone (FLONASE) 50 MCG/ACT nasal spray Place 2 sprays into the nose daily.  16 g  0  . gabapentin (NEURONTIN) 300 MG capsule Take 600 mg by mouth 2 (two) times daily.      Marland Kitchen glucose blood (ACCU-CHEK AVIVA PLUS) test strip To use twice daily to check blood sugar. diag code 250.60. Non insulin dependent  100 each  12  . lisinopril (PRINIVIL,ZESTRIL) 5 MG tablet Take 1 tablet (5 mg total) by mouth daily.  30 tablet  3  . meloxicam (MOBIC) 7.5 MG tablet Take 7.5 mg by mouth 2 (two) times daily as needed for pain (take 1 tablet every day, take secod tablet if needed).      . metFORMIN (GLUCOPHAGE) 1000 MG tablet Take 1 tablet (1,000 mg total) by mouth 2 (two) times daily.  60 tablet  3  . naproxen (NAPROSYN) 500 MG tablet Take 1 tablet (500 mg  total) by mouth 2 (two) times daily with a meal.  60 tablet  0  . omeprazole (PRILOSEC) 20 MG capsule Take 1 capsule (20 mg total) by mouth daily.  90 capsule  3  . ondansetron (ZOFRAN ODT) 4 MG disintegrating tablet 4mg  ODT q4 hours prn nausea/vomit  8 tablet  0  . oxyCODONE-acetaminophen (PERCOCET/ROXICET) 5-325 MG per tablet Take 1-2 tablets by mouth every 6 (six) hours as needed for pain.  5 tablet  0  . traMADol (ULTRAM) 50 MG tablet Take 1 tablet (50 mg total) by mouth every 6 (six) hours as needed for pain.  40 tablet  0  . traMADol (ULTRAM) 50 MG tablet Take 1 tablet (50 mg total) by mouth every 6 (six) hours as  needed for pain.  15 tablet  0   No current facility-administered medications for this visit.   Family History  Problem Relation Age of Onset  . Diabetes Mother   . Hypertension Mother   . Hyperlipidemia Mother   . Diabetes Brother   . Hypertension Brother   . Heart attack Neg Hx   . Sudden death Neg Hx    History   Social History  . Marital Status: Single    Spouse Name: N/A    Number of Children: N/A  . Years of Education: N/A   Social History Main Topics  . Smoking status: Current Some Day Smoker -- 0.10 packs/day    Types: Cigarettes    Last Attempt to Quit: 01/05/2012  . Smokeless tobacco: Never Used  . Alcohol Use: 3.0 oz/week    5 Cans of beer per week  . Drug Use: No  . Sexual Activity: Yes    Birth Control/ Protection: None   Other Topics Concern  . None   Social History Narrative  . None    Review of Systems: Full 14-point review of systems otherwise negative. See HPI.   Objective:  Physical Exam: Filed Vitals:   10/27/13 1339  BP: 152/90  Pulse: 68  Temp: 97.9 F (36.6 C)  TempSrc: Oral  Height: 5\' 9"  (1.753 m)  Weight: 185 lb 11.2 oz (84.233 kg)  SpO2: 99%   Physical Exam: There were no vitals filed for this visit. Constitutional: Vital signs reviewed.  Patient is a well-developed and well-nourished, in no acute distress and cooperative with exam.   HEENT:  Head: Normocephalic and atraumatic Eyes: PERRL, EOMI, conjunctivae normal, No scleral icterus.  Neck: Supple, Trachea midline normal ROM, No JVD,  Cardiovascular: RRR, S1 normal, S2 normal, no MRG, pulses symmetric and intact bilaterally Pulmonary/Chest: CTAB, no wheezes, rales, or rhonchi Abdominal: Soft. Non-tender, non-distended, bowel sounds are normal, no masses, organomegaly, or guarding present.   GU: no CVA tenderness Musculoskeletal: Patient has tenderness in the paraspinal muscles over the lower back and upper back on the right side. There is not limitation of lumbar spine  movement due to pain. Leg raise test is negative bilaterally.  Hematology: no cervical, inginal, or axillary adenopathy.  Neurological: A&O x3, Strength is normal and symmetric bilaterally, cranial nerve II-XII are grossly intact, no focal motor deficit, sensory intact to light touch bilaterally. Brachial reflex 2+ bilaterally. Knee reflex 2+ bilaterally.   Skin: Warm, dry and intact. No rash, cyanosis, or clubbing.  Psychiatric: Normal mood and affect. speech and behavior is normal. Judgment and thought content normal. Cognition and memory are normal.    Assessment & Plan:

## 2013-10-30 NOTE — Progress Notes (Signed)
Case discussed with Dr. Niu soon after the resident saw the patient.  We reviewed the resident's history and exam and pertinent patient test results.  I agree with the assessment, diagnosis, and plan of care documented in the resident's note. 

## 2013-11-16 ENCOUNTER — Encounter (HOSPITAL_COMMUNITY): Payer: Self-pay | Admitting: Emergency Medicine

## 2013-11-16 ENCOUNTER — Emergency Department (HOSPITAL_COMMUNITY)
Admission: EM | Admit: 2013-11-16 | Discharge: 2013-11-16 | Disposition: A | Discharge status: Home / Self Care | Payer: Medicare Other | Attending: Emergency Medicine | Admitting: Emergency Medicine

## 2013-11-16 ENCOUNTER — Emergency Department (HOSPITAL_COMMUNITY): Payer: Medicare Other

## 2013-11-16 DIAGNOSIS — F17200 Nicotine dependence, unspecified, uncomplicated: Secondary | ICD-10-CM | POA: Diagnosis not present

## 2013-11-16 DIAGNOSIS — G8929 Other chronic pain: Secondary | ICD-10-CM | POA: Insufficient documentation

## 2013-11-16 DIAGNOSIS — R05 Cough: Secondary | ICD-10-CM | POA: Insufficient documentation

## 2013-11-16 DIAGNOSIS — M545 Low back pain, unspecified: Secondary | ICD-10-CM | POA: Diagnosis not present

## 2013-11-16 DIAGNOSIS — R071 Chest pain on breathing: Secondary | ICD-10-CM | POA: Insufficient documentation

## 2013-11-16 DIAGNOSIS — R0602 Shortness of breath: Secondary | ICD-10-CM | POA: Insufficient documentation

## 2013-11-16 DIAGNOSIS — M25519 Pain in unspecified shoulder: Secondary | ICD-10-CM | POA: Insufficient documentation

## 2013-11-16 DIAGNOSIS — Z8719 Personal history of other diseases of the digestive system: Secondary | ICD-10-CM | POA: Insufficient documentation

## 2013-11-16 DIAGNOSIS — G709 Myoneural disorder, unspecified: Secondary | ICD-10-CM | POA: Insufficient documentation

## 2013-11-16 DIAGNOSIS — Z79899 Other long term (current) drug therapy: Secondary | ICD-10-CM | POA: Diagnosis not present

## 2013-11-16 DIAGNOSIS — R0789 Other chest pain: Secondary | ICD-10-CM | POA: Diagnosis not present

## 2013-11-16 DIAGNOSIS — E119 Type 2 diabetes mellitus without complications: Secondary | ICD-10-CM | POA: Insufficient documentation

## 2013-11-16 DIAGNOSIS — R9431 Abnormal electrocardiogram [ECG] [EKG]: Secondary | ICD-10-CM | POA: Diagnosis not present

## 2013-11-16 DIAGNOSIS — R059 Cough, unspecified: Secondary | ICD-10-CM | POA: Insufficient documentation

## 2013-11-16 DIAGNOSIS — R61 Generalized hyperhidrosis: Secondary | ICD-10-CM | POA: Insufficient documentation

## 2013-11-16 DIAGNOSIS — B1920 Unspecified viral hepatitis C without hepatic coma: Secondary | ICD-10-CM | POA: Insufficient documentation

## 2013-11-16 DIAGNOSIS — R079 Chest pain, unspecified: Secondary | ICD-10-CM | POA: Diagnosis not present

## 2013-11-16 LAB — GLUCOSE, CAPILLARY
Glucose-Capillary: 346 mg/dL — ABNORMAL HIGH (ref 70–99)
Glucose-Capillary: 334 mg/dL — ABNORMAL HIGH (ref 70–99)

## 2013-11-16 LAB — BASIC METABOLIC PANEL
Calcium: 9.6 mg/dL (ref 8.4–10.5)
Creatinine, Ser: 0.74 mg/dL (ref 0.50–1.35)
GFR calc Af Amer: >90 mL/min (ref >90)

## 2013-11-16 LAB — CBC
MCH: 24.3 pg — ABNORMAL LOW (ref 26.0–34.0)
MCHC: 33.6 g/dL (ref 30.0–36.0)
MCV: 72.3 fL — ABNORMAL LOW (ref 78.0–100.0)
Platelets: 206 10*3/uL (ref 150–400)
RDW: 13.7 % (ref 11.5–15.5)

## 2013-11-16 LAB — POCT I-STAT TROPONIN I

## 2013-11-16 MED ORDER — IBUPROFEN 400 MG PO TABS
600.0000 mg | ORAL_TABLET | Freq: Once | ORAL | Status: AC
  Administered 2013-11-16: 16:00:00 600 mg via ORAL
  Filled 2013-11-16 (×2): qty 1

## 2013-11-16 NOTE — ED Notes (Signed)
Pt is diaphoretic at triage and uncomfortable, pain with deep breath

## 2013-11-16 NOTE — ED Notes (Signed)
cbg [id number] 

## 2013-11-16 NOTE — ED Provider Notes (Signed)
CSN: [id number]     Arrival date & time 11/16/13  1252 History   First MD Initiated Contact with Patient 11/16/13 1328     Chief Complaint  Patient presents with  . Chest Pain   (Consider location/radiation/quality/duration/timing/severity/associated sxs/prior Treatment) HPI Comments: Mr. Flink is a 52 year old male with a PMH of HTN, DM type 2, HCV and chronic back pain 2/2 diabetic polyradiculopathy and mild L4-L5 disc bulge.  He presents with a 2 week history of R sided pleuritic CP.  He describes the pain as 9/10 at its worst and says it feels as if he is being stabbed with a knife.  He has a history of back pain and notes that 6 months ago he began to feel pain below his right shoulder blade.  He feels this chest pain is an extension of the right shoulder blade pain and feels as if it is wrapping around to the chest now.  Associated symptoms include SOB, cough and diaphoresis.  He denies left sided chest pain, N/V, radiation into the arm or between the shoulder blades, lower extremity edema or hemoptysis.  He reports that he is mostly sedentary.  The pain is not brought on by exertion or relieved by rest.  The pain improves when he sleeps on his right side and motrin also helps.  He reports medication compliance.      Patient is a 52 y.o. male presenting with chest pain.  Chest Pain Associated symptoms: back pain, cough and diaphoresis   Associated symptoms: no nausea, no palpitations and not vomiting     Past Medical History  Diagnosis Date  . Diabetes mellitus   . Neuromuscular disorder   . Pancreatitis 01/09/2012  . Hypertension goal BP (blood pressure) < 140/80   . Hepatitis C     [id number]   History reviewed. No pertinent past surgical history. Family History  Problem Relation Age of Onset  . Diabetes Mother   . Hypertension Mother   . Hyperlipidemia Mother   . Diabetes Brother   . Hypertension Brother   . Heart attack Neg Hx   . Sudden death Neg Hx    History  Substance  Use Topics  . Smoking status: Current Some Day Smoker -- 0.10 packs/day    Types: Cigarettes    Last Attempt to Quit: 01/05/2012  . Smokeless tobacco: Never Used  . Alcohol Use: 3.0 oz/week    5 Cans of beer per week    Review of Systems  Constitutional: Positive for diaphoresis.  Respiratory: Positive for cough.   Cardiovascular: Positive for chest pain. Negative for palpitations and leg swelling.  Gastrointestinal: Negative for nausea, vomiting, diarrhea and constipation.  Genitourinary: Negative for dysuria.  Musculoskeletal: Positive for back pain.  Neurological: Negative for syncope.    Allergies  Review of patient's allergies indicates no known allergies.  Home Medications   Current Outpatient Rx  Name  Route  Sig  Dispense  Refill  . Blood Glucose Monitoring Suppl (ACCU-CHEK AVIVA PLUS) W/DEVICE KIT   Does not apply   1 each by Does not apply route 2 (two) times daily. To check blood sugar twice daily. diag code 250.60. Non insulin dependent   1 kit   0   . cyclobenzaprine (FLEXERIL) 10 MG tablet   Oral   Take 10 mg by mouth 2 (two) times daily as needed for muscle spasms.         . fluticasone (FLONASE) 50 MCG/ACT nasal spray   Nasal  Place 2 sprays into the nose daily.   16 g   0   . gabapentin (NEURONTIN) 300 MG capsule   Oral   Take 600 mg by mouth 2 (two) times daily.         Marland Kitchen glucose blood (ACCU-CHEK AVIVA PLUS) test strip      To use twice daily to check blood sugar. diag code 250.60. Non insulin dependent   100 each   12   . lisinopril (PRINIVIL,ZESTRIL) 10 MG tablet   Oral   Take 1 tablet (10 mg total) by mouth daily.   30 tablet   3   . metFORMIN (GLUCOPHAGE) 1000 MG tablet   Oral   Take 1 tablet (1,000 mg total) by mouth 2 (two) times daily.   60 tablet   3   . omeprazole (PRILOSEC) 20 MG capsule   Oral   Take 1 capsule (20 mg total) by mouth daily.   90 capsule   3    BP 133/81  Pulse 86  Temp(Src) 97.8 F (36.6 C)   Resp 18  Wt 180 lb (81.647 kg)  SpO2 98% Physical Exam  Constitutional: He is oriented to person, place, and time. He appears well-developed and well-nourished. No distress.  HENT:  Head: Normocephalic and atraumatic.  Mouth/Throat: Oropharynx is clear and moist. No oropharyngeal exudate.  Eyes: EOM are normal. Pupils are equal, round, and reactive to light.  Neck: Neck supple.  Cardiovascular: Normal rate, regular rhythm and intact distal pulses.  Exam reveals no gallop and no friction rub.   No murmur heard. R arm BP 151/91, L arm BP 166/86  Pulmonary/Chest: Effort normal and breath sounds normal. No respiratory distress. He has no wheezes. He has no rales. He exhibits tenderness.  Right side of chest TTP  Abdominal: Soft. Bowel sounds are normal. He exhibits no distension. There is no tenderness. There is no guarding.  Musculoskeletal: He exhibits tenderness. He exhibits no edema.  Neurological: He is alert and oriented to person, place, and time. No cranial nerve deficit.  Skin: He is not diaphoretic.  Psychiatric: He has a normal mood and affect. His behavior is normal.    ED Course  Procedures (including critical care time) Labs Review Labs Reviewed  CBC - Abnormal; Notable for the following:    RBC 6.14 (*)    MCV 72.3 (*)    MCH 24.3 (*)    All other components within normal limits  BASIC METABOLIC PANEL - Abnormal; Notable for the following:    Sodium 132 (*)    Chloride 94 (*)    Glucose, Bld 366 (*)    All other components within normal limits  GLUCOSE, CAPILLARY - Abnormal; Notable for the following:    Glucose-Capillary 346 (*)    All other components within normal limits  GLUCOSE, CAPILLARY - Abnormal; Notable for the following:    Glucose-Capillary 334 (*)    All other components within normal limits  POCT I-STAT TROPONIN I   Imaging Review Dg Chest 2 View  11/16/2013   CLINICAL DATA:  Chest pain  EXAM: CHEST  2 VIEW  COMPARISON:  None.  FINDINGS: Lungs  are clear. Heart size and pulmonary vascularity are normal. No adenopathy. No pneumothorax. No bone lesions.  IMPRESSION: No abnormality noted.   Electronically Signed   By: Bretta Bang M.D.   On: 11/16/2013 13:41    EKG Interpretation    Date/Time:  Monday November 16 2013 12:59:12 EST Ventricular Rate:  92  PR Interval:  162 QRS Duration: 68 QT Interval:  324 QTC Calculation: 400 R Axis:   18 Text Interpretation:  Normal sinus rhythm Anterior infarct , age undetermined Abnormal ECG Confirmed by Bebe Shaggy  MD, DONALD [phone]) on 11/16/2013 1:21:16 PM            MDM  52 year old male with a PMH of HTN, DM type 2, HCV and chronic back pain 2/2 to diabetic polyradiculopathy and mild L4-L5 disc bulge presenting with 2 week history of chest pain.  PE unlikely given hemodynamic stability and Wells score of 0.  Aortic dissection unlikely given chronicity and lack of physical exam or CXR findings.  ACS ruled out given atypical CP, negative troponin and no EKG changes.  Pneumonia unlikely given negative CXR.  The pain is not acute, is reproducible on exam, relieved by anti-inflammatories and likely referred from the shoulder, which is most consistent with musculoskeletal origin.  He is stable for discharge to home with close follow-up appointment with his PCP.     Evelena Peat, DO 11/16/13 1517

## 2013-11-16 NOTE — ED Notes (Signed)
Pt was requesting "shot in his butt for pain". EDP to bedside to assess patient, reporting clinically does not need and verbally gave order for ibuprofen. Pt provided sprite at time of discharge.

## 2013-11-16 NOTE — ED Notes (Signed)
Pt is here with right posterior pain that is radiation up and around  To his right chest and reports sob.

## 2013-11-16 NOTE — ED Notes (Signed)
CBG completed by Warden Fillers, EMT

## 2013-11-17 ENCOUNTER — Encounter: Payer: Self-pay | Admitting: Internal Medicine

## 2013-11-17 ENCOUNTER — Ambulatory Visit (INDEPENDENT_AMBULATORY_CARE_PROVIDER_SITE_OTHER): Payer: Medicare Other | Admitting: Internal Medicine

## 2013-11-17 VITALS — BP 130/80 | HR 87 | Temp 98.9°F | Ht 69.0 in | Wt 183.1 lb

## 2013-11-17 DIAGNOSIS — IMO0002 Reserved for concepts with insufficient information to code with codable children: Secondary | ICD-10-CM

## 2013-11-17 DIAGNOSIS — R059 Cough, unspecified: Secondary | ICD-10-CM | POA: Diagnosis not present

## 2013-11-17 DIAGNOSIS — R058 Other specified cough: Secondary | ICD-10-CM

## 2013-11-17 DIAGNOSIS — E1149 Type 2 diabetes mellitus with other diabetic neurological complication: Secondary | ICD-10-CM | POA: Diagnosis not present

## 2013-11-17 DIAGNOSIS — R05 Cough: Secondary | ICD-10-CM

## 2013-11-17 DIAGNOSIS — I10 Essential (primary) hypertension: Secondary | ICD-10-CM | POA: Diagnosis not present

## 2013-11-17 MED ORDER — MELOXICAM 15 MG PO TABS: 15.0000 mg | ORAL_TABLET | Freq: Every day | ORAL | Status: DC | PRN

## 2013-11-17 NOTE — Patient Instructions (Signed)
1. Will give your Mobic 15 mg po daily x 10- 14 days as needed 2. Call the clinic in 2 weeks if your pain is not better. Will likely send you to your Sport medicine MD.   Back Pain, Adult Low back pain is very common. About 1 in 5 people have back pain.The cause of low back pain is rarely dangerous. The pain often gets better over time.About half of people with a sudden onset of back pain feel better in just 2 weeks. About 8 in 10 people feel better by 6 weeks.  CAUSES Some common causes of back pain include:  Strain of the muscles or ligaments supporting the spine.  Wear and tear (degeneration) of the spinal discs.  Arthritis.  Direct injury to the back. DIAGNOSIS Most of the time, the direct cause of low back pain is not known.However, back pain can be treated effectively even when the exact cause of the pain is unknown.Answering your caregiver's questions about your overall health and symptoms is one of the most accurate ways to make sure the cause of your pain is not dangerous. If your caregiver needs more information, he or she may order lab work or imaging tests (X-rays or MRIs).However, even if imaging tests show changes in your back, this usually does not require surgery. HOME CARE INSTRUCTIONS For many people, back pain returns.Since low back pain is rarely dangerous, it is often a condition that people can learn to [facility] their own.   Remain active. It is stressful on the back to sit or stand in one place. Do not sit, drive, or stand in one place for more than 30 minutes at a time. Take short walks on level surfaces as soon as pain allows.Try to increase the length of time you walk each day.  Do not stay in bed.Resting more than 1 or 2 days can delay your recovery.  Do not avoid exercise or work.Your body is made to move.It is not dangerous to be active, even though your back may hurt.Your back will likely heal faster if you return to being active before your pain is  gone.  Pay attention to your body when you bend and lift. Many people have less discomfortwhen lifting if they bend their knees, keep the load close to their bodies,and avoid twisting. Often, the most comfortable positions are those that put less stress on your recovering back.  Find a comfortable position to sleep. Use a firm mattress and lie on your side with your knees slightly bent. If you lie on your back, put a pillow under your knees.  Only take over-the-counter or prescription medicines as directed by your caregiver. Over-the-counter medicines to reduce pain and inflammation are often the most helpful.Your caregiver may prescribe muscle relaxant drugs.These medicines help dull your pain so you can more quickly return to your normal activities and healthy exercise.  Put ice on the injured area.  Put ice in a plastic bag.  Place a towel between your skin and the bag.  Leave the ice on for 15-20 minutes, 03-04 times a day for the first 2 to 3 days. After that, ice and heat may be alternated to reduce pain and spasms.  Ask your caregiver about trying back exercises and gentle massage. This may be of some benefit.  Avoid feeling anxious or stressed.Stress increases muscle tension and can worsen back pain.It is important to recognize when you are anxious or stressed and learn ways to manage it.Exercise is a great option. SEEK  MEDICAL CARE IF:  You have pain that is not relieved with rest or medicine.  You have pain that does not improve in 1 week.  You have new symptoms.  You are generally not feeling well. SEEK IMMEDIATE MEDICAL CARE IF:   You have pain that radiates from your back into your legs.  You develop new bowel or bladder control problems.  You have unusual weakness or numbness in your arms or legs.  You develop nausea or vomiting.  You develop abdominal pain.  You feel faint. Document Released: 12/03/2005 Document Revised: 06/03/2012 Document Reviewed:  04/23/2011 [facility] Patient Information 2014 [place], [place].

## 2013-11-17 NOTE — Assessment & Plan Note (Addendum)
Assessment:  The clinical manifestation is consistent with musculoskeletal pain with possible radiculopathy.   Plan: - Mobic 15 mg po daily as needed x 10-14 days - Heat or ice pack to back PRN - verbal and written home care measurement given to patient. - follow up in 2 weeks if back pain is not better or develops new symptoms including weakness, tingling, numbness or incontinence.

## 2013-11-17 NOTE — Progress Notes (Signed)
[name], male   DOB: July 16, 1961, 52 y.o.   MRN: [id number]    Patient: Donald Palmer   MRN: [id number]  DOB: 08-Nov-1961  PCP: Lorretta Harp, MD   Subjective:    CC: Back Pain   HPI: Donald Palmer is a 52 year old male with a PMH of HTN, DM type 2, HCV and chronic back pain 2/2 diabetic polyradiculopathy and mild L4-L5 disc bulge, who presents with ED follow up.  Patient states that he has had intermittent pain at right upper back below his shoulder blade for 6 months. And his pain is radiating to lateral and anterior of right sided chest. He has been taking pain medications (tramadol and hydrocodone) for his pain. He states that his pain gradually becomes worse over last one month. He states that his pain is still located at right upper back, constant, sharp/stabbing pain,  9/10, radiating to lateral and anterior of right sided chest. Activity makes it worse. The pain improves when he sleeps on his right side and motrin also helps.   He denies fever, chill,  left sided chest pain, N/V, radiation into the arm or between the shoulder blades, lower extremity edema or hemoptysis. Denies skin rash and vesicles. No history of shingles.   Of note, he reports that he also noticed intermittent nonproductive cough, SOB and diaphoresis. No appetite changes. No sig weight loss. Denies hemoptysis. He denies travel [place] or sick contact (TB). He was incarcerated in 67.    Review of Systems: Per HPI.   Current Outpatient Medications: Current Outpatient Prescriptions  Medication Sig Dispense Refill  . Blood Glucose Monitoring Suppl (ACCU-CHEK AVIVA PLUS) W/DEVICE KIT 1 each by Does not apply route 2 (two) times daily. To check blood sugar twice daily. diag code 250.60. Non insulin dependent  1 kit  0  . cyclobenzaprine (FLEXERIL) 10 MG tablet Take 10 mg by mouth 2 (two) times daily as needed for muscle spasms.      . fluticasone (FLONASE) 50 MCG/ACT nasal spray Place 2 sprays into the nose daily.  16 g   0  . gabapentin (NEURONTIN) 300 MG capsule Take 600 mg by mouth 2 (two) times daily.      Marland Kitchen glucose blood (ACCU-CHEK AVIVA PLUS) test strip To use twice daily to check blood sugar. diag code 250.60. Non insulin dependent  100 each  12  . lisinopril (PRINIVIL,ZESTRIL) 10 MG tablet Take 1 tablet (10 mg total) by mouth daily.  30 tablet  3  . metFORMIN (GLUCOPHAGE) 1000 MG tablet Take 1 tablet (1,000 mg total) by mouth 2 (two) times daily.  60 tablet  3  . omeprazole (PRILOSEC) 20 MG capsule Take 1 capsule (20 mg total) by mouth daily.  90 capsule  3   No current facility-administered medications for this visit.    Allergies: No Known Allergies  Past Medical History  Diagnosis Date  . Diabetes mellitus   . Neuromuscular disorder   . Pancreatitis 01/09/2012  . Hypertension goal BP (blood pressure) < 140/80   . Hepatitis C     [id number]    Objective:    Physical Exam: Filed Vitals:   11/17/13 0907 11/17/13 0921 11/17/13 0922  BP: 147/107 130/80   Pulse: 86  87  Temp: 98.9 F (37.2 C)    TempSrc: Oral    Height: 5\' 9"  (1.753 m)    Weight: 183 lb 1.6 oz (83.054 kg)    SpO2: 99%      General: Vital  signs reviewed and noted. Well-developed, well-nourished, in no acute distress; alert, appropriate and cooperative throughout examination.  Head: Normocephalic, atraumatic.  Lungs:  Normal respiratory effort. Clear to auscultation BL without crackles or wheezes.  Heart: RRR. S1 and S2 normal without gallop, rubs,  murmur.  Abdomen:  BS normoactive. Soft, Nondistended, non-tender.  No masses or organomegaly.  Extremities: No pretibial edema. Right sided upper back paraspinal tenderness noted. No numbness or tingling.    Assessment/ Plan:

## 2013-11-17 NOTE — Assessment & Plan Note (Signed)
Assessment: Patient states that he has had intermittent nonproductive cough, shortness breath and diaphoresis over last 2 months.  Denies postnasal drip, hemoptysis, appetite or weight change. Denies travel overseas or sick contact. No hx of Asthma. Admits incarceration in 1992. CXR unremarkable.   The etiology of his nonproductive cough is unclear.  It could be mild bronchitic inflammatory changes from chronic smoking. The top 3 etiologies for chronic cough include postnasal drip, asthma and GERD.  He denies post nasal drip and no history of asthma He has GERD treated with PPI. Other etiologies like postviral, pneumonia, TB or cancer are unlikely. He has no hemoptysis, appetite/weight change and his CXR is unremarkable, which does not support TB infection. However, he is at risk for TB given his history of incarceration in 54.   Plan - instruct patient to be more compliant with his PPI - smoking cessation - observe and monitor. Consider TB workup If he has hemoptysis, appetite/weight change.

## 2013-11-18 NOTE — ED Provider Notes (Signed)
I have personally seen and examined the patient.  I have discussed the plan of care with the resident.  I have reviewed the documentation on PMH/FH/Soc. History.  I have reviewed the documentation of the resident and agree.  I have reviewed and agree with the ECG interpretation(s) documented by the resident.  Pt sleeping on my evaluation.  After waking up, he was in no distress and his CP was reproducible.  Low suspicion for ACS/Dissection/PE Stable for d/c home  Joya Gaskins, MD 11/18/13 [phone]

## 2013-11-19 NOTE — Addendum Note (Signed)
Addended by: Debe Coder B on: 11/19/2013 09:14 AM   Modules accepted: Level of Service

## 2013-11-19 NOTE — Progress Notes (Signed)
Case discussed with Dr. Li soon after the resident saw the patient. We reviewed the resident's history and exam and pertinent patient test results. I agree with the assessment, diagnosis, and plan of care documented in the resident's note. 

## 2013-12-24 ENCOUNTER — Other Ambulatory Visit: Payer: Medicare Other

## 2013-12-24 DIAGNOSIS — Z1211 Encounter for screening for malignant neoplasm of colon: Secondary | ICD-10-CM

## 2013-12-24 LAB — POC HEMOCCULT BLD/STL (HOME/3-CARD/SCREEN)
Card #2 Fecal Occult Blod, POC: NEGATIVE
FECAL OCCULT BLD: NEGATIVE
FECAL OCCULT BLD: NEGATIVE

## 2014-01-18 ENCOUNTER — Encounter: Payer: Self-pay | Admitting: Internal Medicine

## 2014-01-18 ENCOUNTER — Ambulatory Visit (INDEPENDENT_AMBULATORY_CARE_PROVIDER_SITE_OTHER): Payer: Medicare Other | Admitting: Internal Medicine

## 2014-01-18 ENCOUNTER — Encounter: Payer: Medicare Other | Admitting: Internal Medicine

## 2014-01-18 VITALS — BP 139/82 | HR 80 | Temp 97.9°F | Ht 69.0 in | Wt 185.7 lb

## 2014-01-18 DIAGNOSIS — M549 Dorsalgia, unspecified: Secondary | ICD-10-CM | POA: Diagnosis not present

## 2014-01-18 DIAGNOSIS — E1149 Type 2 diabetes mellitus with other diabetic neurological complication: Secondary | ICD-10-CM

## 2014-01-18 DIAGNOSIS — I10 Essential (primary) hypertension: Secondary | ICD-10-CM

## 2014-01-18 DIAGNOSIS — Z0000 Encounter for general adult medical examination without abnormal findings: Secondary | ICD-10-CM

## 2014-01-18 DIAGNOSIS — M545 Low back pain, unspecified: Secondary | ICD-10-CM

## 2014-01-18 DIAGNOSIS — R059 Cough, unspecified: Secondary | ICD-10-CM | POA: Diagnosis not present

## 2014-01-18 DIAGNOSIS — IMO0002 Reserved for concepts with insufficient information to code with codable children: Secondary | ICD-10-CM | POA: Diagnosis not present

## 2014-01-18 LAB — GLUCOSE, CAPILLARY: Glucose-Capillary: 128 mg/dL — ABNORMAL HIGH (ref 70–99)

## 2014-01-18 LAB — POCT GLYCOSYLATED HEMOGLOBIN (HGB A1C): Hemoglobin A1C: 8.4

## 2014-01-18 NOTE — Assessment & Plan Note (Signed)
Lab Results  Component Value Date   HGBA1C 8.4 01/18/2014   HGBA1C 7.4 09/29/2013   HGBA1C 7.0 06/22/2013     Assessment: Diabetes control:   Progress toward A1C goal:  deteriorated Comments:   Plan: Medications:  continue current medications Home glucose monitoring: Frequency:   Timing:   Instruction/counseling given: discussed diet Educational resources provided: brochure Self management tools provided:   Other plans: patient's A1c has deteriorated today from 7.4 to 8.4. It is likely due to the poor diet control. Patient reports that he has been eating a lot of junk food recently and doing little exercise. Patient does not want to start another medication. He would like to continue taking metformin, and to make more efforts on diet control and do more exercise. Will observe him for 3 months, if not well controlled, will consider add on an another medication then.

## 2014-01-18 NOTE — Progress Notes (Signed)
[name], male   DOB: 23-Feb-1961, 53 y.o.   MRN: [id number] Subjective:    This encounter was created in error - please disregard.

## 2014-01-18 NOTE — Progress Notes (Signed)
[name], male   DOB: 1961/07/23, 53 y.o.   MRN: [id number] Subjective:   [name] male   DOB: May 12, 1961 53 y.o.   MRN: [id number]  CC:   Follow up visit.   HPI:  [name] is a 53 y.o. man with past medical history as outlined below, who presents for a followup visit today   1. Back pain: Patient has chronic back pain due to mild disc bulging, spondylosis at L4-5 and mild foraminal encroachment bilaterally as evidenced by MRI-lumbar on 07/15/12. He was seen in clinic by Dr. Nicoletta Dress and was given a prescription of Mobic 15 mg daily for 10-14 days. Reports that he bought and has been using "BeActive Unit" for his back pain in the past two months.  He is also taking Flexeril and Mobic for his back pain.  He feels that his back pain improved significantly. He wants me to fill out a form for getting a Back Brace and TENS (transcutaneous electrical nerve stimulator) today. Patient does not have alarming symptoms, such weakness, tingling, numbness or incontinence.   2. DM-II: Patient is taking metformin 1000 mg twice a day. His A1c 7.4 on 09/29/13-->8.4 today. He reports that he is doing very little exercise, walks approximately 10-15 minutes daily. He reports that he has been eating a lot of junk food recently    3. HTN: patient's is taking lisinopril 10 mg daily currently. His blood pressure is 139/82 today. He does not have chest pain, shortness of breath, leg edema.  ROS:  Denies fever, chills, fatigue, headaches, cough, chest pain, SOB,  abdominal pain, diarrhea, constipation, dysuria, urgency, frequency, hematuria.   Past Medical History  Diagnosis Date  . Diabetes mellitus   . Neuromuscular disorder   . Pancreatitis 01/09/2012  . Hypertension goal BP (blood pressure) < 140/80   . Hepatitis C     [id number]   Current Outpatient Prescriptions  Medication Sig Dispense Refill  . Blood Glucose Monitoring Suppl (ACCU-CHEK AVIVA PLUS) W/DEVICE KIT 1 each by Does not apply  route 2 (two) times daily. To check blood sugar twice daily. diag code 250.60. Non insulin dependent  1 kit  0  . cyclobenzaprine (FLEXERIL) 10 MG tablet Take 10 mg by mouth 2 (two) times daily as needed for muscle spasms.      . fluticasone (FLONASE) 50 MCG/ACT nasal spray Place 2 sprays into the nose daily.  16 g  0  . gabapentin (NEURONTIN) 300 MG capsule Take 600 mg by mouth 2 (two) times daily.      Marland Kitchen glucose blood (ACCU-CHEK AVIVA PLUS) test strip To use twice daily to check blood sugar. diag code 250.60. Non insulin dependent  100 each  12  . lisinopril (PRINIVIL,ZESTRIL) 10 MG tablet Take 1 tablet (10 mg total) by mouth daily.  30 tablet  3  . meloxicam (MOBIC) 15 MG tablet Take 1 tablet (15 mg total) by mouth daily as needed for pain. Do not take other NSAIDs such as ibuprofen, motrin, naproxen while on this medication. Do not take more than prescribed. FOR PAIN  14 tablet  1  . metFORMIN (GLUCOPHAGE) 1000 MG tablet Take 1 tablet (1,000 mg total) by mouth 2 (two) times daily.  60 tablet  3  . omeprazole (PRILOSEC) 20 MG capsule Take 1 capsule (20 mg total) by mouth daily.  90 capsule  3   No current facility-administered medications for this visit.   Family History  Problem Relation Age of Onset  .  Diabetes Mother   . Hypertension Mother   . Hyperlipidemia Mother   . Diabetes Brother   . Hypertension Brother   . Heart attack Neg Hx   . Sudden death Neg Hx    History   Social History  . Marital Status: Single    Spouse Name: N/A    Number of Children: N/A  . Years of Education: N/A   Social History Main Topics  . Smoking status: Current Some Day Smoker -- 0.10 packs/day    Types: Cigarettes    Last Attempt to Quit: 01/05/2012  . Smokeless tobacco: Never Used  . Alcohol Use: 3.0 oz/week    5 Cans of beer per week  . Drug Use: No  . Sexual Activity: Yes    Birth Control/ Protection: None   Other Topics Concern  . None   Social History Narrative  . None    Review  of Systems: Full 14-point review of systems otherwise negative. See HPI.   Objective:  Physical Exam: Filed Vitals:   01/18/14 1549  BP: 139/82  Pulse: 80  Temp: 97.9 F (36.6 C)  TempSrc: Oral  Height: 5' 9"  (1.753 m)  Weight: 185 lb 11.2 oz (84.233 kg)  SpO2: 99%   Physical Exam: There were no vitals filed for this visit. Constitutional: Vital signs reviewed.  Patient is a well-developed and well-nourished, in no acute distress and cooperative with exam.    HEENT:   Head: Normocephalic and atraumatic Eyes: PERRL, EOMI, conjunctivae normal, No scleral icterus.   Neck: Supple, Trachea midline normal ROM, No JVD,   Cardiovascular: RRR, S1 normal, S2 normal, no MRG, pulses symmetric and intact bilaterally Pulmonary/Chest: CTAB, no wheezes, rales, or rhonchi Abdominal: Soft. Non-tender, non-distended, bowel sounds are normal, no masses, organomegaly, or guarding present.   GU: no CVA tenderness Musculoskeletal: Patient has tenderness in the paraspinal muscles over the lower back. There is not limitation of lumbar spine movement due to pain. Leg raise test is negative bilaterally.   Hematology: no cervical, inginal, or axillary adenopathy.   Neurological: A&O x3, Strength is normal and symmetric bilaterally, cranial nerve II-XII are grossly intact, no focal motor deficit, sensory intact to light touch bilaterally. Brachial reflex 2+ bilaterally. Knee reflex 2+ bilaterally.   Skin: Warm, dry and intact. No rash, cyanosis, or clubbing.   Psychiatric: Normal mood and affect. speech and behavior is normal. Judgment and thought content normal. Cognition and memory are normal.    Assessment & Plan:

## 2014-01-18 NOTE — Patient Instructions (Signed)
1. You have done great job in taking all your medications. I appreciate it very much. Please continue doing that. 2. Please take all medications as prescribed.  3. If you have worsening of your symptoms or new symptoms arise, please call the clinic ([phone]), or go to the ER immediately if symptoms are severe.     

## 2014-01-18 NOTE — Assessment & Plan Note (Signed)
BP Readings from Last 3 Encounters:  01/18/14 139/82  11/17/13 130/80  11/16/13 140/94    Lab Results  Component Value Date   NA 132* 11/16/2013   K 4.6 11/16/2013   CREATININE 0.74 11/16/2013    Assessment: Blood pressure control: controlled Progress toward BP goal:  at goal Comments:   Plan: Medications:  continue current medications Educational resources provided: brochure Self management tools provided: home blood pressure logbook Other plans: continue lisinopril 10 mg daily

## 2014-01-18 NOTE — Assessment & Plan Note (Signed)
-  Patient refused colonoscopy in the past, his recent Hemoccult test was negative on 12/24/13.

## 2014-01-18 NOTE — Assessment & Plan Note (Signed)
Patient reports his back pain improved significantly, which he has attributed to the use of BeActive Unit. He does not have alarming symptoms, such as incontinence or weakness. -will continue Mobic and Flexeril -let patient continue BeActive Unit -I filled up a form for patient for Back Brace and TENS.

## 2014-01-19 NOTE — Progress Notes (Signed)
Case discussed with Dr. Niu at time of visit.  We reviewed the resident's history and exam and pertinent patient test results.  I agree with the assessment, diagnosis, and plan of care documented in the resident's note. 

## 2014-01-22 ENCOUNTER — Other Ambulatory Visit: Payer: Self-pay | Admitting: *Deleted

## 2014-01-22 DIAGNOSIS — E119 Type 2 diabetes mellitus without complications: Secondary | ICD-10-CM

## 2014-01-22 MED ORDER — METFORMIN HCL 1000 MG PO TABS: 1000.0000 mg | ORAL_TABLET | Freq: Two times a day (BID) | ORAL | Status: DC

## 2014-02-18 DIAGNOSIS — E119 Type 2 diabetes mellitus without complications: Secondary | ICD-10-CM | POA: Diagnosis not present

## 2014-03-01 DIAGNOSIS — H40019 Open angle with borderline findings, low risk, unspecified eye: Secondary | ICD-10-CM | POA: Diagnosis not present

## 2014-04-14 ENCOUNTER — Other Ambulatory Visit: Payer: Self-pay | Admitting: *Deleted

## 2014-04-14 DIAGNOSIS — M549 Dorsalgia, unspecified: Secondary | ICD-10-CM

## 2014-04-14 DIAGNOSIS — I10 Essential (primary) hypertension: Secondary | ICD-10-CM

## 2014-04-14 MED ORDER — LISINOPRIL 10 MG PO TABS: 10.0000 mg | ORAL_TABLET | Freq: Every day | ORAL | Status: DC

## 2014-04-14 MED ORDER — CYCLOBENZAPRINE HCL 10 MG PO TABS: 10.0000 mg | ORAL_TABLET | Freq: Two times a day (BID) | ORAL | Status: DC | PRN

## 2014-04-19 ENCOUNTER — Encounter: Payer: Self-pay | Admitting: Internal Medicine

## 2014-04-19 ENCOUNTER — Ambulatory Visit (INDEPENDENT_AMBULATORY_CARE_PROVIDER_SITE_OTHER): Payer: Medicare Other | Admitting: Internal Medicine

## 2014-04-19 VITALS — BP 151/98 | HR 68 | Temp 97.8°F | Wt 180.8 lb

## 2014-04-19 DIAGNOSIS — IMO0002 Reserved for concepts with insufficient information to code with codable children: Secondary | ICD-10-CM | POA: Diagnosis not present

## 2014-04-19 DIAGNOSIS — I10 Essential (primary) hypertension: Secondary | ICD-10-CM

## 2014-04-19 DIAGNOSIS — M545 Low back pain, unspecified: Secondary | ICD-10-CM | POA: Diagnosis not present

## 2014-04-19 DIAGNOSIS — R059 Cough, unspecified: Secondary | ICD-10-CM | POA: Diagnosis not present

## 2014-04-19 DIAGNOSIS — M549 Dorsalgia, unspecified: Secondary | ICD-10-CM | POA: Diagnosis not present

## 2014-04-19 DIAGNOSIS — E1149 Type 2 diabetes mellitus with other diabetic neurological complication: Secondary | ICD-10-CM | POA: Diagnosis not present

## 2014-04-19 DIAGNOSIS — G8929 Other chronic pain: Secondary | ICD-10-CM

## 2014-04-19 DIAGNOSIS — Z0000 Encounter for general adult medical examination without abnormal findings: Secondary | ICD-10-CM | POA: Diagnosis not present

## 2014-04-19 DIAGNOSIS — M5416 Radiculopathy, lumbar region: Secondary | ICD-10-CM

## 2014-04-19 LAB — POCT GLYCOSYLATED HEMOGLOBIN (HGB A1C): Hemoglobin A1C: 7.3

## 2014-04-19 LAB — GLUCOSE, CAPILLARY: Glucose-Capillary: 248 mg/dL — ABNORMAL HIGH (ref 70–99)

## 2014-04-19 MED ORDER — HYDROCODONE-ACETAMINOPHEN 7.5-325 MG PO TABS: 1.0000 | ORAL_TABLET | Freq: Every evening | ORAL | Status: DC | PRN

## 2014-04-19 NOTE — Assessment & Plan Note (Signed)
Chronic low back pain. Patient was treated with Norco 7.5mg  in the past with significant improvement of symptoms. Discussed with the attending regarding further management.  Plans: Start Norco 7.5 mg as needed at night time. Prescribed #30 with no refills. Recommended patient to discuss with his PCP regarding continuing this as chronic therapy for his low back pain.

## 2014-04-19 NOTE — Addendum Note (Signed)
Addended by: Carter Kitten on: 04/19/2014 05:31 PM   Modules accepted: Level of Service

## 2014-04-19 NOTE — Assessment & Plan Note (Addendum)
A1C improved to 7.3 today. Congratulated patient on watching his diet and making the life style changes. Encouraged him to continue watching his diet.  Plans: Continue current regimen.

## 2014-04-19 NOTE — Patient Instructions (Signed)
Take the medications as advised.  If your symptoms worsen, please call us or seek medical help.

## 2014-04-19 NOTE — Assessment & Plan Note (Signed)
Slightly elevated in the setting of pain. Patient most recent blood pressures well controlled on the current regimen. Discussed with the attending regarding further management.  Plans: Continue Lisinopril at current strength. Monitor his BP at the next office visit. If it continues to run high, consider increasing the dose.

## 2014-04-19 NOTE — Progress Notes (Signed)
Subjective:   [name] male   DOB: 1961/11/02 53 y.o.   MRN: [id number]  HPI: [name] is a 53 y.o. gentleman with PMH significant for HTN, DM-II, chronic low back pain comes to the office with CC of low back pain and burning sensation in his leg and feet.  Per patient, he has had this low back pain for the last three years. He has had an MRI done in July 2013 that revealed mild disc bulging at L4/L5 level, mild foraminal narrowing. Patient was treated with several medications including Norco, Percocet, Lidoderm patch, flexeril, Tramadol, Mobic, Gabapentin. Patient was referred to sports medicine in the past and underwent physical therapy with only mild improvement in symptoms. Patient has been wearing bracing. Patient was prescribed TENS in the past and he reports that his insurance company declined it. Patient reports that he has been having severe back pain recently and is not able to sleep well. Patient reports using a cane for the last one year. Patient reports that Norco helped him in the past with his back pain and is requesting for a refill.  Patient reports that his lower back pain radiates to both his gluteal regions. Patient also reports he has had burning pain in his both feet, along with tingling sensation. He also reports similar pain and tingling sensation in the posterior aspect of his thigh and leg on both sides. He has been using gabapentin for several years now for his neuropathy and he states that it only helps slightly.   His A1C today in the office is 7.3 (from 8.4 in February 2015).     Past Medical History  Diagnosis Date  . Diabetes mellitus   . Neuromuscular disorder   . Pancreatitis 01/09/2012  . Hypertension goal BP (blood pressure) < 140/80   . Hepatitis C     [id number]   Current Outpatient Prescriptions  Medication Sig Dispense Refill  . Blood Glucose Monitoring Suppl (ACCU-CHEK AVIVA PLUS) W/DEVICE KIT 1 each by Does not apply route 2 (two)  times daily. To check blood sugar twice daily. diag code 250.60. Non insulin dependent  1 kit  0  . cyclobenzaprine (FLEXERIL) 10 MG tablet Take 1 tablet (10 mg total) by mouth 2 (two) times daily as needed for muscle spasms.  30 tablet  3  . fluticasone (FLONASE) 50 MCG/ACT nasal spray Place 2 sprays into the nose daily.  16 g  0  . gabapentin (NEURONTIN) 300 MG capsule Take 600 mg by mouth 2 (two) times daily.      Marland Kitchen glucose blood (ACCU-CHEK AVIVA PLUS) test strip To use twice daily to check blood sugar. diag code 250.60. Non insulin dependent  100 each  12  . HYDROcodone-acetaminophen (NORCO) 7.5-325 MG per tablet Take 1 tablet by mouth at bedtime as needed for moderate pain.  30 tablet  0  . lisinopril (PRINIVIL,ZESTRIL) 10 MG tablet Take 1 tablet (10 mg total) by mouth daily.  30 tablet  3  . meloxicam (MOBIC) 15 MG tablet Take 1 tablet (15 mg total) by mouth daily as needed for pain. Do not take other NSAIDs such as ibuprofen, motrin, naproxen while on this medication. Do not take more than prescribed. FOR PAIN  14 tablet  1  . metFORMIN (GLUCOPHAGE) 1000 MG tablet Take 1 tablet (1,000 mg total) by mouth 2 (two) times daily.  180 tablet  4  . omeprazole (PRILOSEC) 20 MG capsule Take 1 capsule (20 mg total) by mouth  daily.  90 capsule  3   No current facility-administered medications for this visit.   Family History  Problem Relation Age of Onset  . Diabetes Mother   . Hypertension Mother   . Hyperlipidemia Mother   . Diabetes Brother   . Hypertension Brother   . Heart attack Neg Hx   . Sudden death Neg Hx    History   Social History  . Marital Status: Single    Spouse Name: N/A    Number of Children: N/A  . Years of Education: N/A   Social History Main Topics  . Smoking status: Current Some Day Smoker -- 0.10 packs/day    Types: Cigarettes    Last Attempt to Quit: 01/05/2012  . Smokeless tobacco: Never Used  . Alcohol Use: 3.0 oz/week    5 Cans of beer per week  . Drug  Use: No  . Sexual Activity: Yes    Birth Control/ Protection: None   Other Topics Concern  . None   Social History Narrative  . None   Review of Systems: Pertinent items are noted in HPI. Objective:  Physical Exam: Filed Vitals:   04/19/14 0944  BP: 151/98  Pulse: 68  Temp: 97.8 F (36.6 C)  TempSrc: Oral  Weight: 180 lb 12.8 oz (82.01 kg)  SpO2: 98%   Constitutional: Vital signs reviewed.   Patient is a well-developed and well-nourished and is in no acute distress and cooperative with exam. Alert and oriented x3.  Cardiovascular: RRR, S1 normal, S2 normal, no MRG, pulses symmetric and intact bilaterally Pulmonary/Chest: Normal respiratory effort, CTAB, no wheezes, rales, or rhonchi Musculoskeletal: Mild TTP over the L/3/4/5 areas. SLR test: Pain elicited at the lower back upon active flexion of right hip to 60 degrees and left hip to 75 degrees. Mild stiffness of paraspinal spinal muscles noted on the left side at lower thoracic to upper lumbar levels. Neurological: A&O x3, Strength is 5/5 and equal in both LE's, UE's. Sensations to light touch slightly diminished over the right posterior aspect of the leg compared to the left.   Skin: Warm, dry and intact. No rash, cyanosis, or clubbing.  Psychiatric: Normal mood and affect.   Assessment & Plan:

## 2014-04-22 NOTE — Progress Notes (Signed)
Case discussed with Dr. Boggala at the time of the visit.  We reviewed the resident's history and exam and pertinent patient test results.  I agree with the assessment, diagnosis, and plan of care documented in the resident's note. 

## 2014-05-28 ENCOUNTER — Ambulatory Visit (INDEPENDENT_AMBULATORY_CARE_PROVIDER_SITE_OTHER): Payer: Medicare Other | Admitting: Internal Medicine

## 2014-05-28 ENCOUNTER — Encounter: Payer: Self-pay | Admitting: Internal Medicine

## 2014-05-28 VITALS — BP 130/85 | HR 70 | Temp 97.9°F | Ht 69.0 in | Wt 178.5 lb

## 2014-05-28 DIAGNOSIS — IMO0002 Reserved for concepts with insufficient information to code with codable children: Secondary | ICD-10-CM | POA: Diagnosis not present

## 2014-05-28 DIAGNOSIS — G8929 Other chronic pain: Secondary | ICD-10-CM | POA: Diagnosis not present

## 2014-05-28 DIAGNOSIS — M545 Low back pain, unspecified: Secondary | ICD-10-CM | POA: Diagnosis not present

## 2014-05-28 DIAGNOSIS — G609 Hereditary and idiopathic neuropathy, unspecified: Secondary | ICD-10-CM | POA: Diagnosis not present

## 2014-05-28 DIAGNOSIS — I10 Essential (primary) hypertension: Secondary | ICD-10-CM | POA: Diagnosis not present

## 2014-05-28 DIAGNOSIS — M549 Dorsalgia, unspecified: Secondary | ICD-10-CM | POA: Diagnosis not present

## 2014-05-28 DIAGNOSIS — E1149 Type 2 diabetes mellitus with other diabetic neurological complication: Secondary | ICD-10-CM

## 2014-05-28 DIAGNOSIS — R059 Cough, unspecified: Secondary | ICD-10-CM | POA: Diagnosis not present

## 2014-05-28 DIAGNOSIS — Z0000 Encounter for general adult medical examination without abnormal findings: Secondary | ICD-10-CM | POA: Diagnosis not present

## 2014-05-28 MED ORDER — GABAPENTIN 300 MG PO CAPS: 600.0000 mg | ORAL_CAPSULE | Freq: Three times a day (TID) | ORAL | Status: DC

## 2014-05-28 NOTE — Assessment & Plan Note (Signed)
Well controlled.  Plans: Continue current regimen. 

## 2014-05-28 NOTE — Patient Instructions (Signed)
Start taking the Gabapentin 600 mg three times daily.

## 2014-05-28 NOTE — Progress Notes (Signed)
Subjective:   [name] male   DOB: 1961-05-10 53 y.o.   MRN: [id number]  HPI: [name] is a 53 y.o. gentleman with PMH significant for HTN, DM-II, chronic low back pain comes to the office with CC burning sensation over his left mid abdomen x 1 month.  He reports that his symptoms started about a month ago, over his left mid abdomen beside the umbilical area, about 5 cm in diameter. Patient reports that the burning sensation is constant, not associated with any pain or any other other abdominal symptoms such as N/V, diarrhea, constipation, abdominal pain, fever, chills, dysuria, back pain etc. Patient reports similar symptoms about 3-4 months ago that resolved on its own. Patient reports taking the gabapentin once daily and reports mild relief of the symptoms. Patient is concerned if this is appendix, or hernia and wanted to be seen by a doctor.  Patient denies any other complaints.  Past Medical History  Diagnosis Date  . Diabetes mellitus   . Neuromuscular disorder   . Pancreatitis 01/09/2012  . Hypertension goal BP (blood pressure) < 140/80   . Hepatitis C     [id number]   Current Outpatient Prescriptions  Medication Sig Dispense Refill  . Blood Glucose Monitoring Suppl (ACCU-CHEK AVIVA PLUS) W/DEVICE KIT 1 each by Does not apply route 2 (two) times daily. To check blood sugar twice daily. diag code 250.60. Non insulin dependent  1 kit  0  . cyclobenzaprine (FLEXERIL) 10 MG tablet Take 1 tablet (10 mg total) by mouth 2 (two) times daily as needed for muscle spasms.  30 tablet  3  . fluticasone (FLONASE) 50 MCG/ACT nasal spray Place 2 sprays into the nose daily.  16 g  0  . gabapentin (NEURONTIN) 300 MG capsule Take 600 mg by mouth 2 (two) times daily.      Marland Kitchen glucose blood (ACCU-CHEK AVIVA PLUS) test strip To use twice daily to check blood sugar. diag code 250.60. Non insulin dependent  100 each  12  . HYDROcodone-acetaminophen (NORCO) 7.5-325 MG per tablet Take 1  tablet by mouth at bedtime as needed for moderate pain.  30 tablet  0  . lisinopril (PRINIVIL,ZESTRIL) 10 MG tablet Take 1 tablet (10 mg total) by mouth daily.  30 tablet  3  . meloxicam (MOBIC) 15 MG tablet Take 1 tablet (15 mg total) by mouth daily as needed for pain. Do not take other NSAIDs such as ibuprofen, motrin, naproxen while on this medication. Do not take more than prescribed. FOR PAIN  14 tablet  1  . metFORMIN (GLUCOPHAGE) 1000 MG tablet Take 1 tablet (1,000 mg total) by mouth 2 (two) times daily.  180 tablet  4  . omeprazole (PRILOSEC) 20 MG capsule Take 1 capsule (20 mg total) by mouth daily.  90 capsule  3   No current facility-administered medications for this visit.   Family History  Problem Relation Age of Onset  . Diabetes Mother   . Hypertension Mother   . Hyperlipidemia Mother   . Diabetes Brother   . Hypertension Brother   . Heart attack Neg Hx   . Sudden death Neg Hx    History   Social History  . Marital Status: Single    Spouse Name: N/A    Number of Children: N/A  . Years of Education: N/A   Social History Main Topics  . Smoking status: Current Some Day Smoker -- 0.10 packs/day    Types: Cigarettes  Last Attempt to Quit: 01/05/2012  . Smokeless tobacco: Never Used  . Alcohol Use: 3.0 oz/week    5 Cans of beer per week  . Drug Use: No  . Sexual Activity: Yes    Birth Control/ Protection: None   Other Topics Concern  . None   Social History Narrative  . None   Review of Systems: Pertinent items are noted in HPI. Objective:  Physical Exam: Filed Vitals:   05/28/14 1442  BP: 130/85  Pulse: 70  Temp: 97.9 F (36.6 C)  TempSrc: Oral  Height: _0  (1.753 m)  Weight: 178 lb 8 oz (80.967 kg)  SpO2: 100%   Constitutional: Vital signs reviewed.  Patient is a well-developed and well-nourished and is in no acute distress and cooperative with exam. Alert and oriented x3.    Cardiovascular: RRR, S1 normal, S2 normal, no  MRG Pulmonary/Chest: normal respiratory effort, CTAB, no wheezes, rales, or rhonchi Abdominal: Soft. Non-tender, non-distended, bowel sounds are normal, no masses, organomegaly, or guarding present. No difference in tactile sensations over the left or right side of the abdomen. No visible skin lesion and nothing is palpable in the area of his symptoms. GU: no CVA tenderness Neurological: A&O x3 Skin: Warm, dry and intact. No rash, cyanosis, or clubbing.  Psychiatric: Normal mood and affect.  Assessment & Plan:

## 2014-05-28 NOTE — Assessment & Plan Note (Signed)
Patient presenting with symptoms of burning sensation over the left mid abdomen. No other GI symptoms and Physical exam is unrevealing. Symptoms improved by gabapentin, as per patient.  Plans: Increase Gabapentin to 600 mg TID.  Follow up as needed.

## 2014-05-31 NOTE — Progress Notes (Signed)
Case discussed with Dr. Boggala at the time of the visit.  We reviewed the resident's history and exam and pertinent patient test results.  I agree with the assessment, diagnosis, and plan of care documented in the resident's note. 

## 2014-06-23 ENCOUNTER — Other Ambulatory Visit: Payer: Self-pay | Admitting: *Deleted

## 2014-06-23 MED ORDER — METFORMIN HCL 1000 MG PO TABS: 1000.0000 mg | ORAL_TABLET | Freq: Two times a day (BID) | ORAL | Status: DC

## 2014-06-23 NOTE — Telephone Encounter (Signed)
Needs Sept or OCt  appt PCP routine appt

## 2014-06-23 NOTE — Telephone Encounter (Signed)
Message sent to front desk pool per Dr Lynnae January.

## 2014-07-05 ENCOUNTER — Ambulatory Visit (INDEPENDENT_AMBULATORY_CARE_PROVIDER_SITE_OTHER): Payer: Medicare Other | Admitting: Internal Medicine

## 2014-07-05 VITALS — BP 150/88 | HR 82 | Temp 97.8°F | Wt 179.7 lb

## 2014-07-05 DIAGNOSIS — M545 Low back pain, unspecified: Secondary | ICD-10-CM | POA: Diagnosis not present

## 2014-07-05 DIAGNOSIS — I10 Essential (primary) hypertension: Secondary | ICD-10-CM | POA: Diagnosis not present

## 2014-07-05 DIAGNOSIS — E1149 Type 2 diabetes mellitus with other diabetic neurological complication: Secondary | ICD-10-CM | POA: Diagnosis not present

## 2014-07-05 DIAGNOSIS — Z0000 Encounter for general adult medical examination without abnormal findings: Secondary | ICD-10-CM | POA: Diagnosis not present

## 2014-07-05 DIAGNOSIS — R05 Cough: Secondary | ICD-10-CM | POA: Diagnosis not present

## 2014-07-05 DIAGNOSIS — M5416 Radiculopathy, lumbar region: Secondary | ICD-10-CM

## 2014-07-05 DIAGNOSIS — IMO0002 Reserved for concepts with insufficient information to code with codable children: Secondary | ICD-10-CM

## 2014-07-05 DIAGNOSIS — G609 Hereditary and idiopathic neuropathy, unspecified: Secondary | ICD-10-CM | POA: Diagnosis not present

## 2014-07-05 DIAGNOSIS — R059 Cough, unspecified: Secondary | ICD-10-CM | POA: Diagnosis not present

## 2014-07-05 DIAGNOSIS — G8929 Other chronic pain: Secondary | ICD-10-CM | POA: Diagnosis not present

## 2014-07-05 DIAGNOSIS — M549 Dorsalgia, unspecified: Secondary | ICD-10-CM | POA: Diagnosis not present

## 2014-07-05 MED ORDER — HYDROCODONE-ACETAMINOPHEN 7.5-325 MG PO TABS: 1.0000 | ORAL_TABLET | Freq: Every evening | ORAL | Status: DC | PRN

## 2014-07-05 NOTE — Progress Notes (Signed)
Case discussed with Dr. Boggala at the time of the visit.  We reviewed the resident's history and exam and pertinent patient test results.  I agree with the assessment, diagnosis, and plan of care documented in the resident's note. 

## 2014-07-05 NOTE — Progress Notes (Signed)
Subjective:   [name] male   DOB: Nov 24, 1961 53 y.o.   MRN: [id number]  HPI: DonaldDonald Palmer is a 53 y.o. gentleman with PMH significant for HTN, DM-II, chronic low back pain comes to the office with CC of low back pain, with radiation to the left gluteal area x 1 week.  I saw Donald Palmer in may 2015 for acute on chronic lower back pain. Patient has had this low back pain for the last three years. He has had an MRI done in July 2013 that revealed mild disc bulging at L4/L5 level, mild foraminal narrowing. Patient was treated with several medications including Norco, Percocet, Lidoderm patch, flexeril, Tramadol, Mobic, Gabapentin. Patient was referred to sports medicine in the past and underwent physical therapy with only mild improvement in symptoms. Patient has been wearing bracing. Patient was prescribed TENS in the past and he reports that his insurance company declined it. Patient reports that he has been having severe back pain recently and is not able to sleep well. Patient reports using a cane for the last one year. Patient reports that Norco helped him in the past with his back pain and is requesting for a refill.   Patient reports that his lower back pain that started to worsen in the last one week, radiates to his left gluteal region. He denies any tingling, numbness, weakness in the extremities. He denies any bowel or bladder incontinence. He denies any fever, chills, nausea, vomiting. Patient also complains of occasional left sided inguinal pain. He denies any testicular pain, discharge from the penis, dysuria.   He denies any other complaints.  Past Medical History  Diagnosis Date  . Diabetes mellitus   . Neuromuscular disorder   . Pancreatitis 01/09/2012  . Hypertension goal BP (blood pressure) < 140/80   . Hepatitis C     [id number]   Current Outpatient Prescriptions  Medication Sig Dispense Refill  . Blood Glucose Monitoring Suppl (ACCU-CHEK AVIVA PLUS) W/DEVICE KIT 1  each by Does not apply route 2 (two) times daily. To check blood sugar twice daily. diag code 250.60. Non insulin dependent  1 kit  0  . cyclobenzaprine (FLEXERIL) 10 MG tablet Take 1 tablet (10 mg total) by mouth 2 (two) times daily as needed for muscle spasms.  30 tablet  3  . fluticasone (FLONASE) 50 MCG/ACT nasal spray Place 2 sprays into the nose daily.  16 g  0  . gabapentin (NEURONTIN) 300 MG capsule Take 2 capsules (600 mg total) by mouth 3 (three) times daily.      Marland Kitchen glucose blood (ACCU-CHEK AVIVA PLUS) test strip To use twice daily to check blood sugar. diag code 250.60. Non insulin dependent  100 each  12  . HYDROcodone-acetaminophen (NORCO) 7.5-325 MG per tablet Take 1 tablet by mouth at bedtime as needed for moderate pain.  30 tablet  0  . lisinopril (PRINIVIL,ZESTRIL) 10 MG tablet Take 1 tablet (10 mg total) by mouth daily.  30 tablet  3  . meloxicam (MOBIC) 15 MG tablet Take 1 tablet (15 mg total) by mouth daily as needed for pain. Do not take other NSAIDs such as ibuprofen, motrin, naproxen while on this medication. Do not take more than prescribed. FOR PAIN  14 tablet  1  . metFORMIN (GLUCOPHAGE) 1000 MG tablet Take 1 tablet (1,000 mg total) by mouth 2 (two) times daily.  180 tablet  3  . omeprazole (PRILOSEC) 20 MG capsule Take 1 capsule (20 mg total)  by mouth daily.  90 capsule  3   No current facility-administered medications for this visit.   Family History  Problem Relation Age of Onset  . Diabetes Mother   . Hypertension Mother   . Hyperlipidemia Mother   . Diabetes Brother   . Hypertension Brother   . Heart attack Neg Hx   . Sudden death Neg Hx    History   Social History  . Marital Status: Single    Spouse Name: N/A    Number of Children: N/A  . Years of Education: N/A   Social History Main Topics  . Smoking status: Current Some Day Smoker -- 0.10 packs/day    Types: Cigarettes    Last Attempt to Quit: 01/05/2012  . Smokeless tobacco: Never Used  .  Alcohol Use: 3.0 oz/week    5 Cans of beer per week  . Drug Use: No  . Sexual Activity: Yes    Birth Control/ Protection: None   Other Topics Concern  . Not on file   Social History Narrative  . No narrative on file   Review of Systems: Pertinent items are noted in HPI. Objective:  Physical Exam: Filed Vitals:   07/05/14 0939  BP: 150/88  Pulse: 82  Temp: 97.8 F (36.6 C)  TempSrc: Oral  Weight: 179 lb 11.2 oz (81.511 kg)  SpO2: 98%   Constitutional: Vital signs reviewed.  Patient is a well-developed and well-nourished and is in no acute distress and cooperative with exam.  Cardiovascular: RRR, S1 normal, S2 normal Pulmonary/Chest: normal respiratory effort, CTAB, no wheezes, rales, or rhonchi. Genitalia: No tesicular swelling, erythema or tenderness noted. No discharge noted. Musculoskeletal: Mild TTP over the L4/5/S1 areas in the midline. No paraspinal tenderness noted. SLR test: Right - negative. Left: Pain elicited in the lower back after passively elevating the LLE to about 50-60 degrees. Neurological: A&O x3, Strength is normal and symmetric bilaterally.   Assessment & Plan:

## 2014-07-05 NOTE — Patient Instructions (Signed)
Follow up with sports medicine for your lower back pain.

## 2014-07-05 NOTE — Assessment & Plan Note (Signed)
Chronic low back pain secondary to disc bulging at L4/L5 and mild foraminal narrowing. Patient tried sports medicine referral, PT, PM/R referral, ESI (with relief of symptoms), pain control with opiates on as needed basis. Patient was treated with Norco 7.5mg  in the past with significant improvement of symptoms. Discussed with the attending regarding further management.  Plans: Refill Norco 7.5 mg #30 , as needed at night time. Prescribed #30 with no refills. Recommended patient to follow up with sports medicine for consideration for another ESI. Follow up if the pain persists or worsens or if he develops any other symptoms.

## 2014-07-30 DIAGNOSIS — H40019 Open angle with borderline findings, low risk, unspecified eye: Secondary | ICD-10-CM | POA: Diagnosis not present

## 2014-07-30 LAB — HM DIABETES EYE EXAM

## 2014-08-12 ENCOUNTER — Other Ambulatory Visit: Payer: Self-pay | Admitting: *Deleted

## 2014-08-12 DIAGNOSIS — M545 Low back pain: Principal | ICD-10-CM

## 2014-08-12 DIAGNOSIS — G8929 Other chronic pain: Secondary | ICD-10-CM

## 2014-08-13 ENCOUNTER — Other Ambulatory Visit: Payer: Self-pay | Admitting: Internal Medicine

## 2014-08-13 DIAGNOSIS — M545 Low back pain: Principal | ICD-10-CM

## 2014-08-13 DIAGNOSIS — G8929 Other chronic pain: Secondary | ICD-10-CM

## 2014-08-13 MED ORDER — HYDROCODONE-ACETAMINOPHEN 7.5-325 MG PO TABS: 1.0000 | ORAL_TABLET | Freq: Every evening | ORAL | Status: DC | PRN

## 2014-08-13 NOTE — Telephone Encounter (Signed)
Patient awaiting insurance approval for TENS unit for lower back pain.  Currently being managed with Norco 7.5-325 QHS PRN.  Last written on 07/05/14 by Dr. Eyvonne Mechanic.  He is scheduled to see me in clinic next Friday.  I will give him medication for another month, and we can discuss his pain management when I see him next week.

## 2014-08-13 NOTE — Telephone Encounter (Signed)
Pt aware.

## 2014-08-20 ENCOUNTER — Encounter: Payer: Self-pay | Admitting: Internal Medicine

## 2014-08-20 ENCOUNTER — Encounter: Payer: Medicare Other | Admitting: Internal Medicine

## 2014-09-27 ENCOUNTER — Other Ambulatory Visit: Payer: Self-pay | Admitting: *Deleted

## 2014-09-27 DIAGNOSIS — M545 Low back pain: Principal | ICD-10-CM

## 2014-09-27 DIAGNOSIS — G8929 Other chronic pain: Secondary | ICD-10-CM

## 2014-09-27 MED ORDER — HYDROCODONE-ACETAMINOPHEN 7.5-325 MG PO TABS: 1.0000 | ORAL_TABLET | Freq: Every evening | ORAL | Status: DC | PRN

## 2014-09-27 NOTE — Telephone Encounter (Signed)
Pt called/informed rx ready to be picked up tomorrow and to keep his Nov appt; said he would keep his appt.

## 2014-09-27 NOTE — Telephone Encounter (Signed)
States he completely out.

## 2014-09-27 NOTE — Telephone Encounter (Signed)
Will refill X 1.  If he does not keep his November appointment with Dr. Trudee Kuster, would recommend no further refills of narcotics until seen.

## 2014-10-22 ENCOUNTER — Ambulatory Visit (INDEPENDENT_AMBULATORY_CARE_PROVIDER_SITE_OTHER): Payer: PRIVATE HEALTH INSURANCE | Admitting: Internal Medicine

## 2014-10-22 ENCOUNTER — Encounter: Payer: Self-pay | Admitting: Internal Medicine

## 2014-10-22 DIAGNOSIS — G8929 Other chronic pain: Secondary | ICD-10-CM | POA: Diagnosis not present

## 2014-10-22 DIAGNOSIS — G609 Hereditary and idiopathic neuropathy, unspecified: Secondary | ICD-10-CM | POA: Diagnosis not present

## 2014-10-22 DIAGNOSIS — J302 Other seasonal allergic rhinitis: Secondary | ICD-10-CM | POA: Diagnosis not present

## 2014-10-22 DIAGNOSIS — E1140 Type 2 diabetes mellitus with diabetic neuropathy, unspecified: Secondary | ICD-10-CM | POA: Diagnosis not present

## 2014-10-22 DIAGNOSIS — E1142 Type 2 diabetes mellitus with diabetic polyneuropathy: Secondary | ICD-10-CM | POA: Diagnosis not present

## 2014-10-22 DIAGNOSIS — M545 Low back pain: Secondary | ICD-10-CM | POA: Diagnosis not present

## 2014-10-22 DIAGNOSIS — J309 Allergic rhinitis, unspecified: Secondary | ICD-10-CM | POA: Insufficient documentation

## 2014-10-22 DIAGNOSIS — I152 Hypertension secondary to endocrine disorders: Secondary | ICD-10-CM

## 2014-10-22 DIAGNOSIS — M5416 Radiculopathy, lumbar region: Secondary | ICD-10-CM

## 2014-10-22 DIAGNOSIS — E1143 Type 2 diabetes mellitus with diabetic autonomic (poly)neuropathy: Secondary | ICD-10-CM

## 2014-10-22 DIAGNOSIS — J0101 Acute recurrent maxillary sinusitis: Secondary | ICD-10-CM

## 2014-10-22 LAB — GLUCOSE, CAPILLARY: Glucose-Capillary: 166 mg/dL — ABNORMAL HIGH (ref 70–99)

## 2014-10-22 LAB — POCT GLYCOSYLATED HEMOGLOBIN (HGB A1C): Hemoglobin A1C: 7.4

## 2014-10-22 MED ORDER — FLUTICASONE PROPIONATE 50 MCG/ACT NA SUSP: 2.0000 | Freq: Every day | NASAL | Status: DC

## 2014-10-22 MED ORDER — GLUCOSE BLOOD VI STRP: ORAL_STRIP | Status: DC

## 2014-10-22 MED ORDER — METFORMIN HCL 1000 MG PO TABS
500.0000 mg | ORAL_TABLET | Freq: Two times a day (BID) | ORAL | Status: DC
Start: 2014-10-22 — End: 2014-10-28

## 2014-10-22 MED ORDER — GABAPENTIN 300 MG PO CAPS: 600.0000 mg | ORAL_CAPSULE | Freq: Three times a day (TID) | ORAL | Status: DC

## 2014-10-22 NOTE — Assessment & Plan Note (Addendum)
Reports long-standing numbness and tingling related to diabetes.  Says he was switched to omeprazole, but this was for his GERD. I think he would benefit from restarting gabapentin for his neuropathy.  This may help with his back pain as well. -Restart gabapentin 300 mg TID.

## 2014-10-22 NOTE — Assessment & Plan Note (Addendum)
.   BP Readings from Last 3 Encounters:  07/05/14 150/88  05/28/14 130/85  04/19/14 151/98    Lab Results  Component Value Date   NA 132* 11/16/2013   K 4.6 11/16/2013   CREATININE 0.74 11/16/2013    Assessment: Blood pressure control: controlled Progress toward BP goal:  at goal  Plan: Medications:  continue current medications, lisinopril 10 mg daily.

## 2014-10-22 NOTE — Assessment & Plan Note (Signed)
Reports burning sensation in his nose associated with itching and runny nose.  This typically responds to Flonase, but he ran out of his prescription. -Reordered Flonase.

## 2014-10-22 NOTE — Assessment & Plan Note (Addendum)
Lab Results  Component Value Date   HGBA1C 7.4 10/22/2014   HGBA1C 7.3 04/19/2014   HGBA1C 8.4 01/18/2014     Assessment: Diabetes control: fair control Progress toward A1C goal:  unchanged Comments: Only taking metformin once per day due to concern for low blood sugars.  Also, taking on an empty stomach that is likely causing his vomiting episodes.  Blood sugars averaging 179 (155 to 205), checking every few days.  Plan: Medications:  Change metformin to 500 mg in AM and 1000 mg in PM.  This will hopefully lower his A1C without him feeling poorly from lower blood sugars. Home glucose monitoring: Frequency: once a day Timing: before breakfast Instruction/counseling given: reminded to get eye exam, reminded to bring blood glucose meter & log to each visit, reminded to bring medications to each visit, discussed foot care and discussed diet Other plans: Discussed taking metformin after eating meals to reduce stomach upset. Ordered lipid panel today, but patient left without lab draw.  Will complete at next visit.  Urine microalbumin, foot exam, and flu vaccine administered today.  Reports getting eye exam last month.

## 2014-10-22 NOTE — Progress Notes (Signed)
Subjective:    [name], male    DOB: 05/16/1961, 53 y.o.   MRN: [id number]  HPI  Donald Palmer is a 53 year old man with history of HTN, DM2, and chronic back pain who presents for routine follow-up.  Donald Palmer reports throwing up white foam in the morning after taking his medications.  He has been taking his metformin and Norco in the morning on an empty stomach. In addition, he reports a burning sensation in his nose and face that he thinks is due to running out of his Flonase.  He reports continued numbness and tingling in his feet and hands that is painful when he stands up.  He reports having this pain since he was diagnosed with diabetes in 2007.  He says he was switched from gabapentin to omeprazole, so he has not been taking any medications for his neuropathy.  He has been taking his metformin once per day instead of twice a day as prescribed.  He thinks that his blood sugar gets too low when he takes it twice per day.  He usually takes it in the morning.  He has taken1000 mg in the morning and 500 mg at night in the past without problem. He says that he received an eye exam last month without any changes.  His back pain is constant and hasn't changed recently.  He has been taking ibuprofen and Norco with moderate relief.  The pain is less in his lower back currently and more in his left mid-back and is aggravated by touch and movement.  He has been to sports medicine and received spinal injections in the past without relief.  He used a TENS unit in the past with great relief.  His insurance is currently processing the paperwork for him to receive a new unit.  He should receive a new unit soon.  Review of Systems  Constitutional: Positive for diaphoresis. Negative for fever, chills and activity change.  HENT: Positive for congestion, rhinorrhea and sinus pressure. Negative for sore throat.   Eyes: Negative for discharge and visual disturbance.  Respiratory: Negative for apnea,  choking and shortness of breath.   Cardiovascular: Negative for chest pain and palpitations.  Gastrointestinal: Negative for nausea, vomiting, diarrhea, constipation and abdominal distention.  Genitourinary: Negative for dysuria and difficulty urinating.  Musculoskeletal: Positive for myalgias and back pain. Negative for joint swelling and arthralgias.  Skin: Negative for color change and rash.  Neurological: Positive for numbness. Negative for dizziness, weakness and light-headedness.  Hematological: Negative for adenopathy.  Psychiatric/Behavioral: Negative for dysphoric mood and agitation.       Objective:   Physical Exam  Constitutional: He is oriented to person, place, and time. He appears well-developed and well-nourished. No distress.  HENT:  Head: Normocephalic and atraumatic.  Eyes: Conjunctivae are normal. Pupils are equal, round, and reactive to light. Left eye exhibits no discharge. No scleral icterus.  Neck: Normal range of motion. Neck supple.  Cardiovascular: Normal rate, regular rhythm and normal heart sounds.   Pulmonary/Chest: Effort normal and breath sounds normal. No respiratory distress.  Abdominal: Soft. Bowel sounds are normal. He exhibits no distension.  Musculoskeletal: He exhibits tenderness. He exhibits no edema.  Pain with palpation of left mid back at bottom of ribs.  Pain with flexion and extension of back.  Neurological: He is alert and oriented to person, place, and time. No cranial nerve deficit. He exhibits normal muscle tone.  Skin: Skin is warm and dry. No  rash noted. He is not diaphoretic. No erythema.  Psychiatric: He has a normal mood and affect.          Assessment & Plan:  Please see problem-based assessment and plan.

## 2014-10-22 NOTE — Patient Instructions (Signed)
Thank you for coming to clinic today Donald Palmer.  General instructions: -Your A1C was 7.4 today.  I would like you to start taking your metformin twice a day.  You can do half a tablet in the morning if you don't think you can take the whole tablet. -Restart taking gabapentin for your neuropathy. -I reordered your fluticasone. -Take your medications after a meal to make it easier on your stomach. -Please make a follow up appointment to return to clinic in 3 months.  Thank you for bringing your medicines today. This helps [place] keep you safe from mistakes.

## 2014-10-22 NOTE — Assessment & Plan Note (Addendum)
Back pain appears to be stable to slightly improved from prior.  He is responding to his current medications, but he is still reliant on Norco.  This is helping him remain functional, and he is able to perform all of his baseline activities with his pain meds and a back brace.  He has tried spinal injections in the past without relief, but he may benefit from a referral to sports medicine again for additional management in the future.  He benefited from a TENS unit in the past, and I have completed the paperwork for insurance authorization.  Pain is currently in mid-back and worse with palpation, suggesting musculoskeletal etiology. This may be due to compensation from lower back pain. -Continue Flexeril 10 mg BID PRN, Norco 7.5-325 mg QHS PRN. -Restarting gabapentin for neuropathy, which may help as well. -Patient will contact me with any difficulty getting his TENS unit. -Will try to wean Norco if he receives relief from TENS unit.

## 2014-10-23 LAB — MICROALBUMIN / CREATININE URINE RATIO
CREATININE, URINE: 162.9 mg/dL
MICROALB/CREAT RATIO: 2.5 mg/g (ref 0.0–30.0)
Microalb, Ur: 0.4 mg/dL (ref <2.0)

## 2014-10-25 NOTE — Progress Notes (Signed)
INTERNAL MEDICINE TEACHING ATTENDING ADDENDUM - Aldine Contes, MD: I personally saw and evaluated Mr. Heinecke in this clinic visit in conjunction with the resident, Dr. Trudee Kuster. I have discussed patient's plan of care with medical resident during this visit. I have confirmed the physical exam findings and have read and agree with the clinic note including the plan with the following addition: - Metformin increased to 1500 mg daily in divided doses - Pt instructed to take metformin after food to help with nausea - Restarted on gabapentin for neuropathy. - Pt awaiting TENS unit for LBP. Will attempt to wean off narcotics after pt obtains unit.

## 2014-10-28 ENCOUNTER — Ambulatory Visit (INDEPENDENT_AMBULATORY_CARE_PROVIDER_SITE_OTHER): Payer: PRIVATE HEALTH INSURANCE | Admitting: *Deleted

## 2014-10-28 ENCOUNTER — Ambulatory Visit (INDEPENDENT_AMBULATORY_CARE_PROVIDER_SITE_OTHER): Payer: PRIVATE HEALTH INSURANCE | Admitting: Internal Medicine

## 2014-10-28 ENCOUNTER — Encounter: Payer: Self-pay | Admitting: Internal Medicine

## 2014-10-28 VITALS — BP 162/98 | HR 85 | Temp 97.9°F | Ht 69.0 in | Wt 180.6 lb

## 2014-10-28 DIAGNOSIS — Z0000 Encounter for general adult medical examination without abnormal findings: Secondary | ICD-10-CM

## 2014-10-28 DIAGNOSIS — E1140 Type 2 diabetes mellitus with diabetic neuropathy, unspecified: Secondary | ICD-10-CM

## 2014-10-28 DIAGNOSIS — I10 Essential (primary) hypertension: Secondary | ICD-10-CM

## 2014-10-28 DIAGNOSIS — E1142 Type 2 diabetes mellitus with diabetic polyneuropathy: Secondary | ICD-10-CM

## 2014-10-28 DIAGNOSIS — Z23 Encounter for immunization: Secondary | ICD-10-CM

## 2014-10-28 LAB — BASIC METABOLIC PANEL WITH GFR
BUN: 10 mg/dL (ref 6–23)
CO2: 25 mEq/L (ref 19–32)
Calcium: 10.2 mg/dL (ref 8.4–10.5)
Chloride: 100 mEq/L (ref 96–112)
Creat: 0.77 mg/dL (ref 0.50–1.35)
Glucose, Bld: 95 mg/dL (ref 70–99)
POTASSIUM: 4.3 meq/L (ref 3.5–5.3)
Sodium: 136 mEq/L (ref 135–145)

## 2014-10-28 LAB — LIPID PANEL
CHOLESTEROL: 192 mg/dL (ref 0–200)
HDL: 44 mg/dL (ref >39)
LDL Cholesterol: 109 mg/dL — ABNORMAL HIGH (ref 0–99)
TRIGLYCERIDES: 193 mg/dL — AB (ref <150)
Total CHOL/HDL Ratio: 4.4 Ratio
VLDL: 39 mg/dL (ref 0–40)

## 2014-10-28 MED ORDER — IBUPROFEN 800 MG PO TABS: 800.0000 mg | ORAL_TABLET | Freq: Three times a day (TID) | ORAL | Status: DC | PRN

## 2014-10-28 MED ORDER — METFORMIN HCL 1000 MG PO TABS: ORAL_TABLET | ORAL | Status: DC

## 2014-10-28 NOTE — Assessment & Plan Note (Signed)
Lab Results  Component Value Date   HGBA1C 7.4 10/22/2014   HGBA1C 7.3 04/19/2014   HGBA1C 8.4 01/18/2014     Assessment: Diabetes control: fair control Progress toward A1C goal:  unable to assess Comments: rechecked last visit. No meter today  Plan: Medications:  continue current medications metformin 500mg  in AM and 1000mg  in PM Instruction/counseling given: reminded to bring blood glucose meter & log to each visit

## 2014-10-28 NOTE — Assessment & Plan Note (Signed)
BP Readings from Last 3 Encounters:  10/28/14 162/98  07/05/14 150/88  05/28/14 130/85   Lab Results  Component Value Date   NA 132* 11/16/2013   K 4.6 11/16/2013   CREATININE 0.74 11/16/2013   Assessment: Blood pressure control: severely elevated Progress toward BP goal:  deteriorated Comments: improved on repeat but still elevated likely in setting of severe back pain  Plan: Medications:  continue current medications lisinopril 10mg  Educational resources provided:   Self management tools provided:   Other plans: bmet today, recheck next visit, if still elevated and pain improved, can increase ACEi dose

## 2014-10-28 NOTE — Assessment & Plan Note (Addendum)
Flu vaccine today Lipid panel not done last visit, ordered today

## 2014-10-28 NOTE — Assessment & Plan Note (Addendum)
Chronic lower back pain with hx of radiation down to right leg likely consistent with sciatica. Appears to have a flare of the pain at this time after last visit from pcp last week. Minimal relief with Norco and flexeril. Unable to get TENS unit due to issues with billing code on prior visit. Has not picked up gabapentin yet from pharmacy due to cost.   He was referred to sports medicine today per pcp plan last week, however, after visit further review of his chart revealed last sports medicine visit with Dr. Barbaraann Barthel 08/2013--ESI did not provide relief at that time apparently, started on PT and referred to PM&R. Beyond PT, did not think any further to offer. But follow up advised as needed.   Through discussion with patient today, he did feel like ESI provided some relief so perhaps some discrepancy over time. I do think PT would be great for him if he has not gone or perhaps needs to go again, and I also provided stretches for him to review and do at home today as well. I am not sure at this time if he did indeed go to PT or PM&R as he did not mention it during the visit.   For now, plan is to keep sports medicine referral, perhaps ESI might be option now?  Ibuprofen 800mg  po tid x1 week, check bmet today to monitor renal function Flexeril prn Stretches provided U/a checked for possible hematuria given groin pain--negative for blood, trace leukocytes but no dysuria or urgency or frequency. Less suspicious of possible nephrolithiasis at this time pcp has been sent notice of TENS unit billing code correction by RN Start gabapentin as prescribed on last visit--he will pick this up today Will defer final decision to proceed with sports medicine referral (keep referral for now) vs. PT and/or PM&R to pcp as he is familiar with patient and may have other input Cautioned on alarm symptoms including worsening of pain, any fever, chills, weight loss or other signs of infection sudden weakness, loss of bowel or  bladder to let us know immediate and/or go to ER--he would likely need urgent MRI at that point

## 2014-10-28 NOTE — Patient Instructions (Signed)
General Instructions:  Thank you for bringing your medicines today. This helps [place] keep you safe from mistakes.  Please try ibuprofen 800mg  three times a day for one week and follow up with sports medicine and restart your gabapentin  If you have sudden weakness in your legs, loss of sensation for bowel or bladder, difficulty urinating, fever, chills, weight loss, chest pain, sob, worsening back pain, please call us right away and go to the emergency room  Progress Toward Treatment Goals:  Treatment Goal 10/28/2014  Hemoglobin A1C unable to assess  Blood pressure deteriorated  Stop smoking smoking the same amount    Self Care Goals & Plans:  Self Care Goal 10/28/2014  Manage my medications -  Monitor my health -  Eat healthy foods -  Be physically active -  Stop smoking call [facility] (1-800-QUIT-NOW)    Home Blood Glucose Monitoring 10/22/2014  Check my blood sugar once a day  When to check my blood sugar before breakfast     Care Management & Community Referrals:  Referral 06/22/2013  Referrals made for care management support none needed  Referrals made to community resources none    Sciatica with Rehab The sciatic nerve runs from the back down the leg and is responsible for sensation and control of the muscles in the back (posterior) side of the thigh, lower leg, and foot. Sciatica is a condition that is characterized by inflammation of this nerve.  SYMPTOMS   Signs of nerve damage, including numbness and/or weakness along the posterior side of the lower extremity.  Pain in the back of the thigh that may also travel down the leg.  Pain that worsens when sitting for long periods of time.  Occasionally, pain in the back or buttock. CAUSES  Inflammation of the sciatic nerve is the cause of sciatica. The inflammation is due to something irritating the nerve. Common sources of irritation include:  Sitting for long periods of time.  Direct trauma to the  nerve.  Arthritis of the spine.  Herniated or ruptured disk.  Slipping of the vertebrae (spondylolisthesis).  Pressure from soft tissues, such as muscles or ligament-like tissue (fascia). RISK INCREASES WITH:  Sports that place pressure or stress on the spine (football or weightlifting).  Poor strength and flexibility.  Failure to warm up properly before activity.  Family history of low back pain or disk disorders.  Previous back injury or surgery.  Poor body mechanics, especially when lifting, or poor posture. PREVENTION   Warm up and stretch properly before activity.  Maintain physical fitness:  Strength, flexibility, and endurance.  Cardiovascular fitness.  Learn and use proper technique, especially with posture and lifting. When possible, have coach correct improper technique.  Avoid activities that place stress on the spine. PROGNOSIS If treated properly, then sciatica usually resolves within 6 weeks. However, occasionally surgery is necessary.  RELATED COMPLICATIONS   Permanent nerve damage, including pain, numbness, tingle, or weakness.  Chronic back pain.  Risks of surgery: infection, bleeding, nerve damage, or damage to surrounding tissues. TREATMENT Treatment initially involves resting from any activities that aggravate your symptoms. The use of ice and medication may help reduce pain and inflammation. The use of strengthening and stretching exercises may help reduce pain with activity. These exercises may be performed at home or with referral to a therapist. A therapist may recommend further treatments, such as transcutaneous electronic nerve stimulation (TENS) or ultrasound. Your caregiver may recommend corticosteroid injections to help reduce inflammation of the sciatic nerve. If  symptoms persist despite non-surgical (conservative) treatment, then surgery may be recommended. MEDICATION  If pain medication is necessary, then nonsteroidal anti-inflammatory  medications, such as aspirin and ibuprofen, or other minor pain relievers, such as acetaminophen, are often recommended.  Do not take pain medication for 7 days before surgery.  Prescription pain relievers may be given if deemed necessary by your caregiver. Use only as directed and only as much as you need.  Ointments applied to the skin may be helpful.  Corticosteroid injections may be given by your caregiver. These injections should be reserved for the most serious cases, because they may only be given a certain number of times. HEAT AND COLD  Cold treatment (icing) relieves pain and reduces inflammation. Cold treatment should be applied for 10 to 15 minutes every 2 to 3 hours for inflammation and pain and immediately after any activity that aggravates your symptoms. Use ice packs or massage the area with a piece of ice (ice massage).  Heat treatment may be used prior to performing the stretching and strengthening activities prescribed by your caregiver, physical therapist, or athletic trainer. Use a heat pack or soak the injury in warm water. SEEK MEDICAL CARE IF:  Treatment seems to offer no benefit, or the condition worsens.  Any medications produce adverse side effects. EXERCISES  RANGE OF MOTION (ROM) AND STRETCHING EXERCISES - Sciatica Most people with sciatic will find that their symptoms worsen with either excessive bending forward (flexion) or arching at the low back (extension). The exercises which will help resolve your symptoms will focus on the opposite motion. Your physician, physical therapist or athletic trainer will help you determine which exercises will be most helpful to resolve your low back pain. Do not complete any exercises without first consulting with your clinician. Discontinue any exercises which worsen your symptoms until you speak to your clinician. If you have pain, numbness or tingling which travels down into your buttocks, leg or foot, the goal of the therapy  is for these symptoms to move closer to your back and eventually resolve. Occasionally, these leg symptoms will get better, but your low back pain may worsen; this is typically an indication of progress in your rehabilitation. Be certain to be very alert to any changes in your symptoms and the activities in which you participated in the 24 hours prior to the change. Sharing this information with your clinician will allow him/her to most efficiently treat your condition. These exercises may help you when beginning to rehabilitate your injury. Your symptoms may resolve with or without further involvement from your physician, physical therapist or athletic trainer. While completing these exercises, remember:   Restoring tissue flexibility helps normal motion to return to the joints. This allows healthier, less painful movement and activity.  An effective stretch should be held for at least 30 seconds.  A stretch should never be painful. You should only feel a gentle lengthening or release in the stretched tissue. FLEXION RANGE OF MOTION AND STRETCHING EXERCISES: STRETCH - Flexion, Single Knee to Chest   Lie on a firm bed or floor with both legs extended in front of you.  Keeping one leg in contact with the floor, bring your opposite knee to your chest. Hold your leg in place by either grabbing behind your thigh or at your knee.  Pull until you feel a gentle stretch in your low back. Hold __________ seconds.  Slowly release your grasp and repeat the exercise with the opposite side. Repeat __________ times. Complete this  exercise __________ times per day.  STRETCH - Flexion, Double Knee to Chest  Lie on a firm bed or floor with both legs extended in front of you.  Keeping one leg in contact with the floor, bring your opposite knee to your chest.  Tense your stomach muscles to support your back and then lift your other knee to your chest. Hold your legs in place by either grabbing behind your  thighs or at your knees.  Pull both knees toward your chest until you feel a gentle stretch in your low back. Hold __________ seconds.  Tense your stomach muscles and slowly return one leg at a time to the floor. Repeat __________ times. Complete this exercise __________ times per day.  STRETCH - Low Trunk Rotation   Lie on a firm bed or floor. Keeping your legs in front of you, bend your knees so they are both pointed toward the ceiling and your feet are flat on the floor.  Extend your arms out to the side. This will stabilize your upper body by keeping your shoulders in contact with the floor.  Gently and slowly drop both knees together to one side until you feel a gentle stretch in your low back. Hold for __________ seconds.  Tense your stomach muscles to support your low back as you bring your knees back to the starting position. Repeat the exercise to the other side. Repeat __________ times. Complete this exercise __________ times per day  EXTENSION RANGE OF MOTION AND FLEXIBILITY EXERCISES: STRETCH - Extension, Prone on Elbows  Lie on your stomach on the floor, a bed will be too soft. Place your palms about shoulder width apart and at the height of your head.  Place your elbows under your shoulders. If this is too painful, stack pillows under your chest.  Allow your body to relax so that your hips drop lower and make contact more completely with the floor.  Hold this position for __________ seconds.  Slowly return to lying flat on the floor. Repeat __________ times. Complete this exercise __________ times per day.  RANGE OF MOTION - Extension, Prone Press Ups  Lie on your stomach on the floor, a bed will be too soft. Place your palms about shoulder width apart and at the height of your head.  Keeping your back as relaxed as possible, slowly straighten your elbows while keeping your hips on the floor. You may adjust the placement of your hands to maximize your comfort. As you  gain motion, your hands will come more underneath your shoulders.  Hold this position __________ seconds.  Slowly return to lying flat on the floor. Repeat __________ times. Complete this exercise __________ times per day.  STRENGTHENING EXERCISES - Sciatica  These exercises may help you when beginning to rehabilitate your injury. These exercises should be done near your "sweet spot." This is the neutral, low-back arch, somewhere between fully rounded and fully arched, that is your least painful position. When performed in this safe range of motion, these exercises can be used for people who have either a flexion or extension based injury. These exercises may resolve your symptoms with or without further involvement from your physician, physical therapist or athletic trainer. While completing these exercises, remember:   Muscles can gain both the endurance and the strength needed for everyday activities through controlled exercises.  Complete these exercises as instructed by your physician, physical therapist or athletic trainer. Progress with the resistance and repetition exercises only as your caregiver advises.  You may experience muscle soreness or fatigue, but the pain or discomfort you are trying to eliminate should never worsen during these exercises. If this pain does worsen, stop and make certain you are following the directions exactly. If the pain is still present after adjustments, discontinue the exercise until you can discuss the trouble with your clinician. STRENGTHENING - Deep Abdominals, Pelvic Tilt   Lie on a firm bed or floor. Keeping your legs in front of you, bend your knees so they are both pointed toward the ceiling and your feet are flat on the floor.  Tense your lower abdominal muscles to press your low back into the floor. This motion will rotate your pelvis so that your tail bone is scooping upwards rather than pointing at your feet or into the floor.  With a gentle  tension and even breathing, hold this position for __________ seconds. Repeat __________ times. Complete this exercise __________ times per day.  STRENGTHENING - Abdominals, Crunches   Lie on a firm bed or floor. Keeping your legs in front of you, bend your knees so they are both pointed toward the ceiling and your feet are flat on the floor. Cross your arms over your chest.  Slightly tip your chin down without bending your neck.  Tense your abdominals and slowly lift your trunk high enough to just clear your shoulder blades. Lifting higher can put excessive stress on the low back and does not further strengthen your abdominal muscles.  Control your return to the starting position. Repeat __________ times. Complete this exercise __________ times per day.  STRENGTHENING - Quadruped, Opposite UE/LE Lift  Assume a hands and knees position on a firm surface. Keep your hands under your shoulders and your knees under your hips. You may place padding under your knees for comfort.  Find your neutral spine and gently tense your abdominal muscles so that you can maintain this position. Your shoulders and hips should form a rectangle that is parallel with the floor and is not twisted.  Keeping your trunk steady, lift your right hand no higher than your shoulder and then your left leg no higher than your hip. Make sure you are not holding your breath. Hold this position __________ seconds.  Continuing to keep your abdominal muscles tense and your back steady, slowly return to your starting position. Repeat with the opposite arm and leg. Repeat __________ times. Complete this exercise __________ times per day.  STRENGTHENING - Abdominals and Quadriceps, Straight Leg Raise   Lie on a firm bed or floor with both legs extended in front of you.  Keeping one leg in contact with the floor, bend the other knee so that your foot can rest flat on the floor.  Find your neutral spine, and tense your abdominal  muscles to maintain your spinal position throughout the exercise.  Slowly lift your straight leg off the floor about 6 inches for a count of 15, making sure to not hold your breath.  Still keeping your neutral spine, slowly lower your leg all the way to the floor. Repeat this exercise with each leg __________ times. Complete this exercise __________ times per day. POSTURE AND BODY MECHANICS CONSIDERATIONS - Sciatica Keeping correct posture when sitting, standing or completing your activities will reduce the stress put on different body tissues, allowing injured tissues a chance to heal and limiting painful experiences. The following are general guidelines for improved posture. Your physician or physical therapist will provide you with any instructions specific to your  needs. While reading these guidelines, remember:  The exercises prescribed by your provider will help you have the flexibility and strength to maintain correct postures.  The correct posture provides the optimal environment for your joints to work. All of your joints have less wear and tear when properly supported by a spine with good posture. This means you will experience a healthier, less painful body.  Correct posture must be practiced with all of your activities, especially prolonged sitting and standing. Correct posture is as important when doing repetitive low-stress activities (typing) as it is when doing a single heavy-load activity (lifting). RESTING POSITIONS Consider which positions are most painful for you when choosing a resting position. If you have pain with flexion-based activities (sitting, bending, stooping, squatting), choose a position that allows you to rest in a less flexed posture. You would want to avoid curling into a fetal position on your side. If your pain worsens with extension-based activities (prolonged standing, working overhead), avoid resting in an extended position such as sleeping on your stomach.  Most people will find more comfort when they rest with their spine in a more neutral position, neither too rounded nor too arched. Lying on a non-sagging bed on your side with a pillow between your knees, or on your back with a pillow under your knees will often provide some relief. Keep in mind, being in any one position for a prolonged period of time, no matter how correct your posture, can still lead to stiffness. PROPER SITTING POSTURE In order to minimize stress and discomfort on your spine, you must sit with correct posture Sitting with good posture should be effortless for a healthy body. Returning to good posture is a gradual process. Many people can work toward this most comfortably by using various supports until they have the flexibility and strength to maintain this posture on their own. When sitting with proper posture, your ears will fall over your shoulders and your shoulders will fall over your hips. You should use the back of the chair to support your upper back. Your low back will be in a neutral position, just slightly arched. You may place a small pillow or folded towel at the base of your low back for support.  When working at a desk, create an environment that supports good, upright posture. Without extra support, muscles fatigue and lead to excessive strain on joints and other tissues. Keep these recommendations in mind: CHAIR:   A chair should be able to slide under your desk when your back makes contact with the back of the chair. This allows you to work closely.  The chair's height should allow your eyes to be level with the upper part of your monitor and your hands to be slightly lower than your elbows. BODY POSITION  Your feet should make contact with the floor. If this is not possible, use a foot rest.  Keep your ears over your shoulders. This will reduce stress on your neck and low back. INCORRECT SITTING POSTURES   If you are feeling tired and unable to assume a  healthy sitting posture, do not slouch or slump. This puts excessive strain on your back tissues, causing more damage and pain. Healthier options include:  Using more support, like a lumbar pillow.  Switching tasks to something that requires you to be upright or walking.  Talking a brief walk.  Lying down to rest in a neutral-spine position. PROLONGED STANDING WHILE SLIGHTLY LEANING FORWARD  When completing a task that requires  you to lean forward while standing in one place for a long time, place either foot up on a stationary 2-4 inch high object to help maintain the best posture. When both feet are on the ground, the low back tends to lose its slight inward curve. If this curve flattens (or becomes too large), then the back and your other joints will experience too much stress, fatigue more quickly and can cause pain.  CORRECT STANDING POSTURES Proper standing posture should be assumed with all daily activities, even if they only take a few moments, like when brushing your teeth. As in sitting, your ears should fall over your shoulders and your shoulders should fall over your hips. You should keep a slight tension in your abdominal muscles to brace your spine. Your tailbone should point down to the ground, not behind your body, resulting in an over-extended swayback posture.  INCORRECT STANDING POSTURES  Common incorrect standing postures include a forward head, locked knees and/or an excessive swayback. WALKING Walk with an upright posture. Your ears, shoulders and hips should all line-up. PROLONGED ACTIVITY IN A FLEXED POSITION When completing a task that requires you to bend forward at your waist or lean over a low surface, try to find a way to stabilize 3 of 4 of your limbs. You can place a hand or elbow on your thigh or rest a knee on the surface you are reaching across. This will provide you more stability so that your muscles do not fatigue as quickly. By keeping your knees relaxed, or  slightly bent, you will also reduce stress across your low back. CORRECT LIFTING TECHNIQUES DO :   Assume a wide stance. This will provide you more stability and the opportunity to get as close as possible to the object which you are lifting.  Tense your abdominals to brace your spine; then bend at the knees and hips. Keeping your back locked in a neutral-spine position, lift using your leg muscles. Lift with your legs, keeping your back straight.  Test the weight of unknown objects before attempting to lift them.  Try to keep your elbows locked down at your sides in order get the best strength from your shoulders when carrying an object.  Always ask for help when lifting heavy or awkward objects. INCORRECT LIFTING TECHNIQUES DO NOT:   Lock your knees when lifting, even if it is a small object.  Bend and twist. Pivot at your feet or move your feet when needing to change directions.  Assume that you cannot safely pick up a paperclip without proper posture. Document Released: 12/03/2005 Document Revised: 04/19/2014 Document Reviewed: 03/17/2009 [facility] Patient Information 2015 [place], [place]. This information is not intended to replace advice given to you by your health care provider. Make sure you discuss any questions you have with your health care provider.

## 2014-10-28 NOTE — Progress Notes (Signed)
Subjective:   [name] male   DOB: 10-02-1961 53 y.o.   MRN: [id number]  HPI: Donald Palmer is a 53 y.o. male with chronic lower back pain presenting to [facility]c today for acute visit in regards to his back pain.   Chronic lower back pain--last seen by pcp last week 11/6 for same issue. He was continued on felexeril and norco and gabapentin was restarted. However, gabapentin has not been started yet.  Additionally, paperwork was filled out for TENS unit, but has not been approved due to billing code. Hx of ESI 07/2013 that he says provided some relief. 2013 lumbar xray no acute radiographic abnormality of lumbar spine.   Today, he says he returns for re-evaluation due to worsening of his back pain. He says when he saw his PCP the pain was localized to his lower back maybe more on left side, but now has shifted back to his right side lower back and radiating down to right leg. He says it is very painful, shooting pains, constant, associated with chronic numbness and tingling of right leg but feels it more since Tuesday in right thigh and radiating to right side of groin to testicle. Denies any loss of bowel or bladder, has sensation to urinate and defecate. No dysuria or trouble urinating. Has had this same pain in the past which is when he went to sports medicine but has not returned for some time. Not much relief with Norco and flexeril. Did not take anything today.   DM2--metformin was increased to 155m daily. A1c 7.4.   Past Medical History  Diagnosis Date  . Diabetes mellitus   . Neuromuscular disorder   . Pancreatitis 01/09/2012  . Hypertension goal BP (blood pressure) < 140/80   . Hepatitis C     [id number]  Current Outpatient Prescriptions  Medication Sig Dispense Refill  . Blood Glucose Monitoring Suppl (ACCU-CHEK AVIVA PLUS) W/DEVICE KIT 1 each by Does not apply route 2 (two) times daily. To check blood sugar twice daily. diag code 250.60. Non insulin dependent 1 kit 0    . cyclobenzaprine (FLEXERIL) 10 MG tablet Take 1 tablet (10 mg total) by mouth 2 (two) times daily as needed for muscle spasms. 30 tablet 3  . fluticasone (FLONASE) 50 MCG/ACT nasal spray Place 2 sprays into both nostrils daily. 16 g 2  . gabapentin (NEURONTIN) 300 MG capsule Take 2 capsules (600 mg total) by mouth 3 (three) times daily.    .Marland Kitchenglucose blood (ACCU-CHEK AVIVA PLUS) test strip To use twice daily to check blood sugar. diag code 250.60. Non insulin dependent 100 each 12  . HYDROcodone-acetaminophen (NORCO) 7.5-325 MG per tablet Take 1 tablet by mouth at bedtime as needed for moderate pain. 30 tablet 0  . ibuprofen (ADVIL,MOTRIN) 800 MG tablet Take 800 mg by mouth every 6 (six) hours as needed for moderate pain.    .Marland Kitchenlisinopril (PRINIVIL,ZESTRIL) 10 MG tablet Take 1 tablet (10 mg total) by mouth daily. 30 tablet 3  . metFORMIN (GLUCOPHAGE) 1000 MG tablet Take 0.5-1 tablets (500-1,000 mg total) by mouth 2 (two) times daily. Take 0.5 tablets in the morning and 1 tablet at night. 180 tablet 3  . omeprazole (PRILOSEC) 20 MG capsule Take 1 capsule (20 mg total) by mouth daily. 90 capsule 3   No current facility-administered medications for this visit.   Family History  Problem Relation Age of Onset  . Diabetes Mother   . Hypertension Mother   . Hyperlipidemia  Mother   . Diabetes Brother   . Hypertension Brother   . Heart attack Neg Hx   . Sudden death Neg Hx    History   Social History  . Marital Status: Single    Spouse Name: N/A    Number of Children: N/A  . Years of Education: N/A   Social History Main Topics  . Smoking status: Current Some Day Smoker -- 0.10 packs/day    Types: Cigarettes    Last Attempt to Quit: 01/05/2012  . Smokeless tobacco: Never Used  . Alcohol Use: 3.0 oz/week    5 Cans of beer per week  . Drug Use: No  . Sexual Activity: Yes    Birth Control/ Protection: None   Other Topics Concern  . Not on file   Social History Narrative   Review of  Systems:  Constitutional:  Denies fever, chills  Respiratory:  Denies SOB, DOE, cough, and wheezing.   Cardiovascular:  Denies chest pain  Gastrointestinal:  Denies nausea, vomiting, abdominal pain. Endorses occasional burning sensation on left side of stomach  Genitourinary:  Denies dysuria, hematuria, and difficulty urinating.   Musculoskeletal:  Chronic lower back pain, radiating to right leg, numbness and tingling in lower extremities  Skin:  Denies pallor, rash and wound.   Neurological:  Denies headaches or weakness   Objective:  Physical Exam: Filed Vitals:   10/28/14 0951 10/28/14 1107  BP: 144/100 162/98  Pulse: 89 85  Temp: 97.9 F (36.6 C)   TempSrc: Oral   Height: _0  (1.753 m)   Weight: 180 lb 9.6 oz (81.92 kg)   SpO2: 100%    Vitals reviewed. General: sitting in chair, acute distress, stands up at times to relieve pain HEENT: EOMI Cardiac: RRR Pulm: clear to auscultation bilaterally, no wheezes, rales, or rhonchi Abd: soft, nontender, nondistended, BS present Ext: moving all extremities but right leg is painful with movement. Wearing a back brace. Lower back pain noted to radiated down to right groin and down right leg on anterior and posterior surface per patient. No tenderness to palpation of back but endorses soreness on lower spine and b/l sides. Tenderness to palpation of right buttock and soreness in legs. Able to raise left leg without difficulty, right leg raise up to 15-20 degrees and limited due to lower back pain.  Neuro: alert and oriented X3, strength equal in bilateral upper and lower extremities, sensation intact in b/l extremities, ?slightly decreased on toes of right foot, reflexes equal and intact  Assessment & Plan:  Discussed with Dr. Daryll Drown

## 2014-10-29 NOTE — Progress Notes (Signed)
Internal Medicine Clinic Attending  Case discussed with Dr. Qureshi soon after the resident saw the patient.  We reviewed the resident's history and exam and pertinent patient test results.  I agree with the assessment, diagnosis, and plan of care documented in the resident's note. 

## 2014-11-03 ENCOUNTER — Other Ambulatory Visit: Payer: Self-pay | Admitting: Internal Medicine

## 2014-11-03 DIAGNOSIS — M5416 Radiculopathy, lumbar region: Secondary | ICD-10-CM

## 2014-11-03 NOTE — Addendum Note (Signed)
Addended by: Hulan Fray on: 11/03/2014 02:59 PM   Modules accepted: Orders

## 2014-11-15 NOTE — Addendum Note (Signed)
Addended by: Hulan Fray on: 11/15/2014 04:45 PM   Modules accepted: Orders

## 2014-11-30 ENCOUNTER — Ambulatory Visit: Payer: Commercial Managed Care - HMO | Attending: Internal Medicine | Admitting: Physical Therapy

## 2014-11-30 ENCOUNTER — Other Ambulatory Visit: Payer: Self-pay | Admitting: *Deleted

## 2014-11-30 ENCOUNTER — Ambulatory Visit: Payer: Commercial Managed Care - HMO | Admitting: Physical Therapy

## 2014-11-30 DIAGNOSIS — M545 Low back pain, unspecified: Secondary | ICD-10-CM

## 2014-11-30 DIAGNOSIS — M5441 Lumbago with sciatica, right side: Secondary | ICD-10-CM

## 2014-11-30 DIAGNOSIS — G8929 Other chronic pain: Secondary | ICD-10-CM

## 2014-11-30 DIAGNOSIS — R208 Other disturbances of skin sensation: Secondary | ICD-10-CM | POA: Diagnosis not present

## 2014-11-30 DIAGNOSIS — R209 Unspecified disturbances of skin sensation: Secondary | ICD-10-CM

## 2014-11-30 NOTE — Therapy (Signed)
Outpatient Rehabilitation [facility] [address] [place], [place], [address] Phone: [phone]   Fax:  [phone]  Physical Therapy Evaluation  Patient Details  Name: Donald Palmer MRN: [id number] Date of Birth: December 20, 1960  Encounter Date: 11/30/2014      PT End of Session - 11/30/14 2031    Visit Number 1   Number of Visits 12   Date for PT Re-Evaluation 01/11/15   PT Start Time 5726   PT Stop Time 1515   PT Time Calculation (min) 50 min   Activity Tolerance Patient limited by pain      Past Medical History  Diagnosis Date  . Diabetes mellitus   . Neuromuscular disorder   . Pancreatitis 01/09/2012  . Hypertension goal BP (blood pressure) < 140/80   . Hepatitis C     [id number]    No past surgical history on file.  There were no vitals taken for this visit.  Visit Diagnosis:  Right-sided low back pain with right-sided sciatica  Sensory disturbance      Subjective Assessment - 11/30/14 1430    Symptoms Pain and nerve pain in LT. low back, stiffness.  Patient had PT previously with good relief.  Pain has returned and not improved since steroid injection.     Pertinent History diabetes, Hep C and high blood pressure   Limitations Sitting;Lifting;Standing;Walking;House hold activities   How long can you sit comfortably? 30-45 min   How long can you stand comfortably? 30-45 min   How long can you walk comfortably? 30-45 min   Diagnostic tests MRI, not recent   Patient Stated Goals I want ot be able to get myself back together, hold grandkids   Currently in Pain? Yes   Pain Score 9    Pain Location Back   Pain Orientation Right;Lower   Pain Descriptors / Indicators Aching;Burning;Sore;Sharp;Tightness;Tingling   Pain Type Chronic pain;Neuropathic pain   Pain Radiating Towards Rt. hip and leg   Pain Onset More than a month ago   Pain Frequency Constant   Aggravating Factors  prolonged positions   Pain Relieving Factors pain meds with in relief   Multiple Pain Sites No          [facility] PT Assessment - 11/30/14 1441    Assessment   Medical Diagnosis lumbar radicultis   Onset Date --  chronic   Next MD Visit --  12/22/14   Prior Therapy --  2014   Precautions   Required Braces or Orthoses Spinal Brace   Spinal Brace --  wears lumbar support   Balance Screen   Has the patient fallen in the past 6 months No   Has the patient had a decrease in activity level because of a fear of falling?  No   Is the patient reluctant to leave their home because of a fear of falling?  No   Sensation   Light Touch Impaired Detail   Light Touch Impaired Details Impaired RLE   Posture/Postural Control   Postural Limitations Forward head;Increased lumbar lordosis;Anterior pelvic tilt   AROM   Lumbar Flexion 50%   Lumbar Extension 15%   Lumbar - Right Side Bend 25%   Lumbar - Left Side Bend 25%   Lumbar - Right Rotation 25%   Lumbar - Left Rotation WNL   Strength   Right Hip Flexion 3+/5   Right Hip Extension 3+/5   Right Hip ABduction 3+/5   Left Hip Flexion 5/5   Left Hip Extension 5/5   Left  Hip ABduction 5/5   Right Knee Flexion 4/5  4+/5   Right Knee Extension 4/5   Left Knee Flexion 5/5   Left Knee Extension 5/5   Flexibility   Hamstrings --  Rt. PROM to 50 deg and L PROM to 65 deg   Special Tests    Special Tests Lumbar   Straight Leg Raise   Findings Negative   Side  Right   Comment --  pain in back at 45 deg   other   Findings --  Manual traction relieved numbness, prone dec toe numbness   Functional Gait  Assessment   Gait assessed  --  antalgic gait            PT Education - 11/30/14 2030    Education provided Yes   Education Details PT/POC, traction, nerve pain and home TENS   Person(s) Educated Patient   Methods Explanation;Demonstration   Comprehension Verbalized understanding          PT Short Term Goals - 11/30/14 2035    PT SHORT TERM GOAL #1   Title Patient will be I with initial HEP    Time 3   Period Weeks   Status New   PT SHORT TERM GOAL #2   Title Pt will report min pain decreased post PT consistently   Time 3   Period Weeks   Status New   PT SHORT TERM GOAL #3   Title Pt  will be able to sit and stand longer periods with only min increase in pain (>30 min)   Time 4   Period Weeks   Status New   PT SHORT TERM GOAL #4   Title Pt. will report less pain/numbness in Rt. LE (25% ) with normal daily activities   Status New          PT Long Term Goals - 11/30/14 2037    PT LONG TERM GOAL #1   Title Pt. will be I with advanced HEP   Time 6   Period Weeks   Status New   PT LONG TERM GOAL #2   Title Pt. will demo proper lifting and body mechanics to preent re-injury.    Time 6   Period Weeks   Status New   PT LONG TERM GOAL #3   Title Pt. will be able to lift mod sized items in the home without pain increase   Time 6   Period Weeks   Status New   PT LONG TERM GOAL #4   Title Pt will wean off back brace 75% of the time.    Time 6   Period Weeks   Status New          Plan - 11/30/14 2032    Clinical Impression Statement Patient presents with sign and sx consistent with lumbar radiculitis.  Responded well to brief manual traction and IFC today.  Will benefit from skilled PT to help centralize pain, reduce further disability.     Pt will benefit from skilled therapeutic intervention in order to improve on the following deficits Abnormal gait;Decreased range of motion;Difficulty walking;Impaired flexibility;Improper body mechanics;Postural dysfunction;Decreased activity tolerance;Increased fascial restricitons;Pain;Increased muscle spasms;Decreased mobility;Decreased strength;Impaired sensation   Rehab Potential Good   PT Frequency 2x / week   PT Duration 6 weeks   PT Treatment/Interventions ADLs/Self Care Home Management;Moist Heat;Patient/family education;Therapeutic activities;Passive range of motion;Therapeutic exercise;Traction;Ultrasound;Gait  training;Manual techniques;Dry needling;Neuromuscular re-education;Cryotherapy;Electrical Stimulation;Functional mobility training   PT Next Visit Plan Give initial HEP, try traction  to L spine   PT Home Exercise Plan prone and add stab in prone or quadruped, childs pose for trunk flexibility   Consulted and Agree with Plan of Care Patient        Problem List Patient Active Problem List   Diagnosis Date Noted  . Allergic rhinitis 10/22/2014  . Diabetic neuropathy 10/22/2014  . Nonproductive cough 11/17/2013  . Hematospermia 06/22/2013  . Preventative health care 12/08/2012  . Headache 09/23/2012  . Lumbar radiculitis 09/16/2012  . GERD (gastroesophageal reflux disease) 07/08/2012  . Microcytosis 05/26/2012  . HCV infection 05/13/2012  . Diabetic polyradiculopathy associated with type 2 diabetes mellitus 04/30/2012  . Hypertension 01/30/2012  . Type 2 diabetes mellitus with diabetic neuropathy 12/17/2001    Ronen Bromwell 11/30/2014, 9:11 PM

## 2014-11-30 NOTE — Patient Instructions (Signed)
Asked patient to try prone on elbows 2 times per da to reduce sensory disturbance. Done verbally. Also explained Home Tens coverage for LBP.  Pt with new benefits, stated they usually do not pay for this diagnosis.

## 2014-12-01 ENCOUNTER — Encounter: Payer: Self-pay | Admitting: *Deleted

## 2014-12-02 ENCOUNTER — Ambulatory Visit: Payer: Commercial Managed Care - HMO | Admitting: Physical Therapy

## 2014-12-03 MED ORDER — HYDROCODONE-ACETAMINOPHEN 7.5-325 MG PO TABS: 1.0000 | ORAL_TABLET | Freq: Every evening | ORAL | Status: DC | PRN

## 2014-12-03 NOTE — Telephone Encounter (Signed)
Patient scheduled to see me 12/22/14.  Went to PT to help with back pain.  Will order refill of Norco and consider decreasing depending on how he is doing at his next appointment.

## 2014-12-07 ENCOUNTER — Ambulatory Visit: Payer: Commercial Managed Care - HMO | Admitting: Physical Therapy

## 2014-12-07 ENCOUNTER — Ambulatory Visit: Payer: Commercial Managed Care - HMO | Admitting: Rehabilitation

## 2014-12-07 DIAGNOSIS — M5441 Lumbago with sciatica, right side: Secondary | ICD-10-CM

## 2014-12-07 DIAGNOSIS — R209 Unspecified disturbances of skin sensation: Secondary | ICD-10-CM

## 2014-12-07 NOTE — Patient Instructions (Signed)
Given photocopy of Home TENS purchase info.     Bracing With Arm Lift (Prone)   With pillow support, lie on abdomen. Find neutral spine. Tighten pelvic floor and abdominals and hold . Alternately raise arms off floor. Repeat _5-10__ times. Do __1_ times a day.   Copyright  VHI. All rights reserved.  Bracing With Leg Lift (Prone)   With pillow support, lie on abdomen and neutral spine, tighten pelvic floor and abdominals and hold. Alternately raise legs off floor. Repeat _5-10__ times. Do __1_ times a day.   Copyright  VHI. All rights reserved.   Bracing With Arm / Leg Lift (Prone)   With pillow support, lie on abdomen. Find neutral spine. Tighten pelvic floor and abdominals and hold. Alternately raise one arm and opposite leg. Repeat _5110__ times. Do _1_ times a day.   Copyright  VHI. All rights reserved.   Flexion   Sitting on knees, fold body over legs and relax head and arms on floor. Extend arms as far forward as possible. Hold ___30_ seconds. Repeat __2-3__ times. Do ___2_ sessions per day.  Copyright  VHI. All rights reserved.  Bring arms over to the L and stretch back onto

## 2014-12-07 NOTE — Therapy (Signed)
[facility] [place], [place], [address] Phone: [phone]   Fax:  [phone]  Physical Therapy Treatment  Patient Details  Name: Donald Palmer MRN: [id number] Date of Birth: 10-26-1961  Encounter Date: 12/07/2014      PT End of Session - 12/07/14 0909    Visit Number 2   Number of Visits 12   Date for PT Re-Evaluation 01/11/15   PT Start Time 0850      Past Medical History  Diagnosis Date  . Diabetes mellitus   . Neuromuscular disorder   . Pancreatitis 01/09/2012  . Hypertension goal BP (blood pressure) < 140/80   . Hepatitis C     [id number]    No past surgical history on file.  There were no vitals taken for this visit.  Visit Diagnosis:  Right-sided low back pain with right-sided sciatica  Sensory disturbance      Subjective Assessment - 12/07/14 0852    Symptoms Patient reports significant relief from TENS.  Back pain on R and numbness in both legs.    Pertinent History diabetes, Hep C and high blood pressure   Limitations Sitting;Lifting;Standing;Walking;House hold activities   Currently in Pain? Yes   Pain Score 6    Pain Location Back   Pain Orientation Right;Lower   Pain Descriptors / Indicators Sore   Pain Type Chronic pain;Neuropathic pain   Pain Radiating Towards Both legs numb   Pain Onset More than a month ago   Pain Frequency Intermittent   Aggravating Factors  prolonged positions   Pain Relieving Factors TENS and prone, stretching   Multiple Pain Sites No           [facility] Adult PT Treatment/Exercise - 12/07/14 0854    Lumbar Exercises: Stretches   Active Hamstring Stretch 3 reps;30 seconds   Quadruped Mid Back Stretch 3 reps;30 seconds   Quadruped Mid Back Stretch Limitations childs pose   Lumbar Exercises: Prone   Single Arm Raise 10 reps   Straight Leg Raise 10 reps   Opposite Arm/Leg Raise Right arm/Left leg;Left arm/Right leg;10 reps   Other Prone Lumbar Exercises  isometric TrA x 10   Electrical Stimulation   Electrical Stimulation Location --  Rt low back   Electrical Stimulation Parameters --  to tolerance, 13   Electrical Stimulation Goals Pain            PT Education - 12/07/14 0909    Education provided Yes   Education Details Home TENS, HEP   Person(s) Educated Patient   Methods Explanation;Demonstration   Comprehension Verbalized understanding          PT Short Term Goals - 11/30/14 2035    PT SHORT TERM GOAL #1   Title Patient will be I with initial HEP   Time 3   Period Weeks   Status New   PT SHORT TERM GOAL #2   Title Pt will report min pain decreased post PT consistently   Time 3   Period Weeks   Status New   PT SHORT TERM GOAL #3   Title Pt  will be able to sit and stand longer periods with only min increase in pain (>30 min)   Time 4   Period Weeks   Status New   PT SHORT TERM GOAL #4   Title Pt. will report less pain/numbness in Rt. LE (25% ) with normal daily activities   Status New  PT Long Term Goals - 11/30/14 2037    PT LONG TERM GOAL #1   Title Pt. will be I with advanced HEP   Time 6   Period Weeks   Status New   PT LONG TERM GOAL #2   Title Pt. will demo proper lifting and body mechanics to preent re-injury.    Time 6   Period Weeks   Status New   PT LONG TERM GOAL #3   Title Pt. will be able to lift mod sized items in the home without pain increase   Time 6   Period Weeks   Status New   PT LONG TERM GOAL #4   Title Pt will wean off back brace 75% of the time.    Time 6   Period Weeks   Status New               Plan - 12/07/14 0909    Clinical Impression Statement 2nd visit, no goals met.     PT Next Visit Plan check HEP, try traction?   PT Home Exercise Plan prone stab, quadruped stretching   Consulted and Agree with Plan of Care Patient        Problem List Patient Active Problem List   Diagnosis Date Noted  . Allergic rhinitis 10/22/2014  . Diabetic  neuropathy 10/22/2014  . Nonproductive cough 11/17/2013  . Hematospermia 06/22/2013  . Preventative health care 12/08/2012  . Headache 09/23/2012  . Lumbar radiculitis 09/16/2012  . GERD (gastroesophageal reflux disease) 07/08/2012  . Microcytosis 05/26/2012  . HCV infection 05/13/2012  . Diabetic polyradiculopathy associated with type 2 diabetes mellitus 04/30/2012  . Hypertension 01/30/2012  . Type 2 diabetes mellitus with diabetic neuropathy 12/17/2001    Bryttani Blew 12/07/2014, 9:20 AM  [facility] [address] [place], [place], [address] Phone: [phone]   Fax:  [phone]   Raeford Razor, PT 12/07/2014 9:20 AM Phone: [phone] Fax: [phone]

## 2014-12-14 ENCOUNTER — Emergency Department (HOSPITAL_COMMUNITY): Payer: Commercial Managed Care - HMO

## 2014-12-14 ENCOUNTER — Emergency Department (HOSPITAL_COMMUNITY)
Admission: EM | Admit: 2014-12-14 | Discharge: 2014-12-14 | Disposition: A | Discharge status: Home / Self Care | Payer: Commercial Managed Care - HMO | Attending: Emergency Medicine | Admitting: Emergency Medicine

## 2014-12-14 ENCOUNTER — Encounter (HOSPITAL_COMMUNITY): Payer: Self-pay | Admitting: *Deleted

## 2014-12-14 ENCOUNTER — Ambulatory Visit: Payer: Commercial Managed Care - HMO

## 2014-12-14 DIAGNOSIS — Z791 Long term (current) use of non-steroidal anti-inflammatories (NSAID): Secondary | ICD-10-CM | POA: Insufficient documentation

## 2014-12-14 DIAGNOSIS — I10 Essential (primary) hypertension: Secondary | ICD-10-CM | POA: Diagnosis not present

## 2014-12-14 DIAGNOSIS — R109 Unspecified abdominal pain: Secondary | ICD-10-CM | POA: Diagnosis present

## 2014-12-14 DIAGNOSIS — Z720 Tobacco use: Secondary | ICD-10-CM | POA: Insufficient documentation

## 2014-12-14 DIAGNOSIS — E119 Type 2 diabetes mellitus without complications: Secondary | ICD-10-CM | POA: Diagnosis not present

## 2014-12-14 DIAGNOSIS — R1012 Left upper quadrant pain: Secondary | ICD-10-CM | POA: Diagnosis not present

## 2014-12-14 DIAGNOSIS — Z7951 Long term (current) use of inhaled steroids: Secondary | ICD-10-CM | POA: Diagnosis not present

## 2014-12-14 DIAGNOSIS — Z79899 Other long term (current) drug therapy: Secondary | ICD-10-CM | POA: Insufficient documentation

## 2014-12-14 DIAGNOSIS — R1030 Lower abdominal pain, unspecified: Secondary | ICD-10-CM

## 2014-12-14 DIAGNOSIS — R1032 Left lower quadrant pain: Secondary | ICD-10-CM | POA: Diagnosis not present

## 2014-12-14 LAB — COMPREHENSIVE METABOLIC PANEL
ALT: 39 U/L (ref 0–53)
AST: 41 U/L — ABNORMAL HIGH (ref 0–37)
Albumin: 4 g/dL (ref 3.5–5.2)
Alkaline Phosphatase: 95 U/L (ref 39–117)
Anion gap: 9 (ref 5–15)
BILIRUBIN TOTAL: 0.9 mg/dL (ref 0.3–1.2)
BUN: 9 mg/dL (ref 6–23)
CHLORIDE: 104 meq/L (ref 96–112)
CO2: 24 mmol/L (ref 19–32)
CREATININE: 0.82 mg/dL (ref 0.50–1.35)
Calcium: 9.7 mg/dL (ref 8.4–10.5)
GFR calc Af Amer: >90 mL/min (ref >90)
Glucose, Bld: 138 mg/dL — ABNORMAL HIGH (ref 70–99)
POTASSIUM: 4.4 mmol/L (ref 3.5–5.1)
Sodium: 137 mmol/L (ref 135–145)
Total Protein: 8.1 g/dL (ref 6.0–8.3)

## 2014-12-14 LAB — CBC WITH DIFFERENTIAL/PLATELET
Basophils Absolute: 0.1 10*3/uL (ref 0.0–0.1)
Basophils Relative: 1 % (ref 0–1)
EOS PCT: 1 % (ref 0–5)
Eosinophils Absolute: 0.1 10*3/uL (ref 0.0–0.7)
HEMATOCRIT: 44.4 % (ref 39.0–52.0)
Hemoglobin: 14.5 g/dL (ref 13.0–17.0)
LYMPHS ABS: 3.3 10*3/uL (ref 0.7–4.0)
LYMPHS PCT: 45 % (ref 12–46)
MCH: 23.5 pg — ABNORMAL LOW (ref 26.0–34.0)
MCHC: 32.7 g/dL (ref 30.0–36.0)
MCV: 72.1 fL — ABNORMAL LOW (ref 78.0–100.0)
MONOS PCT: 8 % (ref 3–12)
Monocytes Absolute: 0.6 10*3/uL (ref 0.1–1.0)
NEUTROS ABS: 3.3 10*3/uL (ref 1.7–7.7)
Neutrophils Relative %: 45 % (ref 43–77)
Platelets: 206 10*3/uL (ref 150–400)
RBC: 6.16 MIL/uL — AB (ref 4.22–5.81)
RDW: 14.4 % (ref 11.5–15.5)
WBC: 7.4 10*3/uL (ref 4.0–10.5)

## 2014-12-14 LAB — URINE MICROSCOPIC-ADD ON

## 2014-12-14 LAB — URINALYSIS, ROUTINE W REFLEX MICROSCOPIC
Bilirubin Urine: NEGATIVE
GLUCOSE, UA: NEGATIVE mg/dL
HGB URINE DIPSTICK: NEGATIVE
Ketones, ur: NEGATIVE mg/dL
Nitrite: NEGATIVE
PH: 5.5 (ref 5.0–8.0)
PROTEIN: NEGATIVE mg/dL
Specific Gravity, Urine: 1.022 (ref 1.005–1.030)
Urobilinogen, UA: 1 mg/dL (ref 0.0–1.0)

## 2014-12-14 MED ORDER — IOHEXOL 300 MG/ML  SOLN
25.0000 mL | Freq: Once | INTRAMUSCULAR | Status: AC | PRN
  Administered 2014-12-14: 25 mL via ORAL

## 2014-12-14 MED ORDER — IOHEXOL 300 MG/ML  SOLN
100.0000 mL | Freq: Once | INTRAMUSCULAR | Status: AC | PRN
  Administered 2014-12-14: 100 mL via INTRAVENOUS

## 2014-12-14 NOTE — ED Notes (Signed)
Pt denies any urinary symptoms or changes in bowel movements.

## 2014-12-14 NOTE — ED Provider Notes (Signed)
CSN: [id number]     Arrival date & time 12/14/14  1497 History   First MD Initiated Contact with Patient 12/14/14 1147     Chief Complaint  Patient presents with  . Abdominal Pain     (Consider location/radiation/quality/duration/timing/severity/associated sxs/prior Treatment) Patient is a 53 y.o. male presenting with abdominal pain. The history is provided by the patient.  Abdominal Pain   Donald Palmer is a 53 y.o. male  who presents for evaluation of left-sided abdominal pain present for 6 months.  His physician has evaluated the problem, clinically, but not with imaging.  He has been treated symptomatically, without relief.  He denies constipation, diarrhea, fever, chills, dysuria, urinary frequency, nausea or vomiting.  There are no other no modifying factors.   Past Medical History  Diagnosis Date  . Diabetes mellitus   . Neuromuscular disorder   . Pancreatitis 01/09/2012  . Hypertension goal BP (blood pressure) < 140/80   . Hepatitis C     [id number]   History reviewed. No pertinent past surgical history. Family History  Problem Relation Age of Onset  . Diabetes Mother   . Hypertension Mother   . Hyperlipidemia Mother   . Diabetes Brother   . Hypertension Brother   . Heart attack Neg Hx   . Sudden death Neg Hx    History  Substance Use Topics  . Smoking status: Current Some Day Smoker -- 0.10 packs/day    Types: Cigarettes    Last Attempt to Quit: 01/05/2012  . Smokeless tobacco: Never Used  . Alcohol Use: 3.0 oz/week    5 Cans of beer per week    Review of Systems  Gastrointestinal: Positive for abdominal pain.  All other systems reviewed and are negative.     Allergies  Review of patient's allergies indicates no known allergies.  Home Medications   Prior to Admission medications   Medication Sig Start Date End Date Taking? Authorizing Provider  cyclobenzaprine (FLEXERIL) 10 MG tablet Take 1 tablet (10 mg total) by mouth 2 (two) times daily as needed  for muscle spasms. 04/14/14  Yes Ivor Costa, MD  fluticasone (FLONASE) 50 MCG/ACT nasal spray Place 2 sprays into both nostrils daily. 10/22/14  Yes Arman Filter, MD  gabapentin (NEURONTIN) 300 MG capsule Take 2 capsules (600 mg total) by mouth 3 (three) times daily. 10/22/14  Yes Arman Filter, MD  HYDROcodone-acetaminophen (Nazlini) 7.5-325 MG per tablet Take 1 tablet by mouth at bedtime as needed for moderate pain. 12/03/14  Yes Arman Filter, MD  ibuprofen (ADVIL,MOTRIN) 800 MG tablet Take 1 tablet (800 mg total) by mouth every 8 (eight) hours as needed for moderate pain. 10/28/14  Yes Wilber Oliphant, MD  lisinopril (PRINIVIL,ZESTRIL) 10 MG tablet Take 1 tablet (10 mg total) by mouth daily. 04/14/14  Yes Ivor Costa, MD  metFORMIN (GLUCOPHAGE) 1000 MG tablet Take 0.5 tablets in the morning and 1 tablet at night. Patient taking differently: Take 1,000 mg by mouth 2 (two) times daily with a meal.  10/28/14  Yes Wilber Oliphant, MD  naproxen sodium (ANAPROX) 220 MG tablet Take 440 mg by mouth 2 (two) times daily as needed (for pain and headache).   Yes Historical Provider, MD  omeprazole (PRILOSEC) 20 MG capsule Take 1 capsule (20 mg total) by mouth daily. 10/19/13  Yes Ivor Costa, MD  Blood Glucose Monitoring Suppl (ACCU-CHEK AVIVA PLUS) W/DEVICE KIT 1 each by Does not apply route 2 (two) times daily. To check blood sugar twice daily. diag code  250.60. Non insulin dependent 10/19/13   Ivor Costa, MD  glucose blood (ACCU-CHEK AVIVA PLUS) test strip To use twice daily to check blood sugar. diag code 250.60. Non insulin dependent 10/22/14   Arman Filter, MD   BP 139/73 mmHg  Pulse 68  Temp(Src) 97.9 F (36.6 C) (Oral)  Resp 16  Ht 5' 9"  (1.753 m)  Wt 180 lb (81.647 kg)  BMI 26.57 kg/m2  SpO2 96% Physical Exam  Constitutional: He is oriented to person, place, and time. He appears well-developed and well-nourished. He appears distressed (he is uncomfortable).  HENT:  Head: Normocephalic and  atraumatic.  Right Ear: External ear normal.  Left Ear: External ear normal.  Eyes: Conjunctivae and EOM are normal. Pupils are equal, round, and reactive to light.  Neck: Normal range of motion and phonation normal. Neck supple.  Cardiovascular: Normal rate, regular rhythm and normal heart sounds.   Pulmonary/Chest: Effort normal and breath sounds normal. He exhibits no bony tenderness.  Abdominal: Soft. He exhibits no mass. There is tenderness (Moderate left upper and lower quadrant tenderness.). There is guarding. There is no rebound.  Musculoskeletal: Normal range of motion.  Neurological: He is alert and oriented to person, place, and time. No cranial nerve deficit or sensory deficit. He exhibits normal muscle tone. Coordination normal.  Skin: Skin is warm, dry and intact.  Psychiatric: He has a normal mood and affect. His behavior is normal. Judgment and thought content normal.  Nursing note and vitals reviewed.   ED Course  Procedures (including critical care time)  Medications  iohexol (OMNIPAQUE) 300 MG/ML solution 25 mL (25 mLs Oral Contrast Given 12/14/14 1437)  iohexol (OMNIPAQUE) 300 MG/ML solution 100 mL (100 mLs Intravenous Contrast Given 12/14/14 1614)    Patient Vitals for the past 24 hrs:  BP Temp Temp src Pulse Resp SpO2 Height Weight  12/14/14 1600 139/73 mmHg - - 68 16 96 % - -  12/14/14 1400 141/88 mmHg - - 66 18 98 % - -  12/14/14 1330 121/65 mmHg - - 69 18 99 % - -  12/14/14 1243 - - - 74 19 99 % - -  12/14/14 1230 128/83 mmHg - - - 19 - - -  12/14/14 1200 144/75 mmHg - - - 21 - - -  12/14/14 1003 129/95 mmHg 97.9 F (36.6 C) Oral 84 22 98 % 5' 9"  (1.753 m) 180 lb (81.647 kg)    5:03 PM Reevaluation with update and discussion. After initial assessment and treatment, an updated evaluation reveals no further complaints, findings discussed with patient, all questions answered.[name]    Labs Review Labs Reviewed  CBC WITH DIFFERENTIAL - Abnormal;  Notable for the following:    RBC 6.16 (*)    MCV 72.1 (*)    MCH 23.5 (*)    All other components within normal limits  COMPREHENSIVE METABOLIC PANEL - Abnormal; Notable for the following:    Glucose, Bld 138 (*)    AST 41 (*)    All other components within normal limits  URINALYSIS, ROUTINE W REFLEX MICROSCOPIC - Abnormal; Notable for the following:    Color, Urine AMBER (*)    Leukocytes, UA TRACE (*)    All other components within normal limits  URINE MICROSCOPIC-ADD ON    Imaging Review Ct Abdomen Pelvis W Contrast  12/14/2014   CLINICAL DATA:  Left-sided abdominal pain x6 months  EXAM: CT ABDOMEN AND PELVIS WITH CONTRAST  TECHNIQUE: Multidetector CT imaging of the abdomen  and pelvis was performed using the standard protocol following bolus administration of intravenous contrast.  CONTRAST:  156m OMNIPAQUE IOHEXOL 300 MG/ML  SOLN  COMPARISON:  None.  FINDINGS: Lower chest:  Mild scarring versus atelectasis at the lung bases.  Hepatobiliary: Liver is within normal limits.  Gallbladder is unremarkable. No intrahepatic or extrahepatic ductal dilatation.  Pancreas: Within normal limits.  Spleen: Within normal limits.  Adrenals/Urinary Tract: Adrenal glands are unremarkable.  Tiny right renal cysts measuring up to 7 mm. Left kidney is within normal limits. No hydronephrosis.  Bladder is within normal limits.  Stomach/Bowel: Stomach is unremarkable.  No evidence of bowel obstruction.  Normal appendix.  No colonic wall thickening or inflammatory changes.  Vascular/Lymphatic: Atherosclerotic calcifications of the abdominal aorta and branch vessels.  No suspicious abdominopelvic lymphadenopathy.  Reproductive: Mild prostatomegaly.  Other: No abdominopelvic ascites.  Tiny fat containing bilateral inguinal hernias.  Musculoskeletal: Visualized osseous structures are within normal limits.  IMPRESSION: No evidence of bowel obstruction.  Normal appendix.  No colonic wall thickening or inflammatory  changes.  No CT findings to account for the patient's chronic left abdominal pain.   Electronically Signed   By: SJulian HyM.D.   On: 12/14/2014 16:51     EKG Interpretation None      MDM   Final diagnoses:  Lower abdominal pain    Nonspecific left upper and lower quadrant abdominal pain.  Screening CT and labs were negative and reassuring.  Doubt bacterial infection, acute intestinal abnormality or impending vascular collapse.  Nursing Notes Reviewed/ Care Coordinated Applicable Imaging Reviewed Interpretation of Laboratory Data incorporated into ED treatment  The patient appears reasonably screened and/or stabilized for discharge and I doubt any other medical condition or other [facility] further screening, evaluation, or treatment in the ED at this time prior to discharge.  Plan: Home Medications- usual; Home Treatments- rest; return here if the recommended treatment, does not improve the symptoms; Recommended follow up- PCP prn    ERicharda Blade MD 12/14/14 1705

## 2014-12-14 NOTE — Discharge Instructions (Signed)

## 2014-12-14 NOTE — ED Notes (Signed)
MD at bedside. 

## 2014-12-14 NOTE — ED Notes (Signed)
C/o abd. Pain onset 6 months ago states it is a burning senssation

## 2014-12-14 NOTE — ED Notes (Signed)
CT notified that pt has finished contrast.

## 2014-12-16 ENCOUNTER — Ambulatory Visit: Payer: Commercial Managed Care - HMO | Admitting: Rehabilitation

## 2014-12-16 DIAGNOSIS — R209 Unspecified disturbances of skin sensation: Secondary | ICD-10-CM

## 2014-12-16 DIAGNOSIS — M5441 Lumbago with sciatica, right side: Secondary | ICD-10-CM
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
## 2014-12-22 ENCOUNTER — Ambulatory Visit (INDEPENDENT_AMBULATORY_CARE_PROVIDER_SITE_OTHER): Payer: Commercial Managed Care - HMO | Admitting: Internal Medicine

## 2014-12-22 ENCOUNTER — Encounter: Payer: Self-pay | Admitting: Internal Medicine

## 2014-12-22 VITALS — BP 152/98 | HR 85 | Temp 98.1°F | Ht 69.0 in | Wt 178.2 lb

## 2014-12-22 DIAGNOSIS — G609 Hereditary and idiopathic neuropathy, unspecified: Secondary | ICD-10-CM

## 2014-12-22 DIAGNOSIS — M5416 Radiculopathy, lumbar region: Secondary | ICD-10-CM

## 2014-12-22 DIAGNOSIS — E1140 Type 2 diabetes mellitus with diabetic neuropathy, unspecified: Secondary | ICD-10-CM | POA: Diagnosis not present

## 2014-12-22 DIAGNOSIS — I152 Hypertension secondary to endocrine disorders: Secondary | ICD-10-CM

## 2014-12-22 DIAGNOSIS — E1169 Type 2 diabetes mellitus with other specified complication: Secondary | ICD-10-CM | POA: Insufficient documentation

## 2014-12-22 DIAGNOSIS — E1142 Type 2 diabetes mellitus with diabetic polyneuropathy: Secondary | ICD-10-CM

## 2014-12-22 DIAGNOSIS — E785 Hyperlipidemia, unspecified: Secondary | ICD-10-CM

## 2014-12-22 MED ORDER — SIMVASTATIN 40 MG PO TABS: 40.0000 mg | ORAL_TABLET | Freq: Every evening | ORAL | Status: DC

## 2014-12-22 MED ORDER — LISINOPRIL 20 MG PO TABS: 20.0000 mg | ORAL_TABLET | Freq: Every day | ORAL | Status: DC

## 2014-12-22 MED ORDER — GABAPENTIN 300 MG PO CAPS: 900.0000 mg | ORAL_CAPSULE | Freq: Three times a day (TID) | ORAL | Status: DC

## 2014-12-22 NOTE — Patient Instructions (Addendum)
Thank you for coming to clinic today Mr. Kesinger.  General instructions: -I increased the dose of your lisinopril because your blood pressure has been high lately. -I also sent a prescription for simvastatin to your pharmacy to lower your cholesterol and decrease your risk of a heart attack. -I increased your dose of gabapentin. -Please make a follow up appointment to return to clinic in 3 months to see if the increased dose of gabapentin is helping.  Please try to bring all your medicines next time. This helps [place] take good care of you and stops mistakes from medicines that could hurt you.  Simvastatin tablets What is this medicine? SIMVASTATIN (SIM va stat in) is known as a HMG-CoA reductase inhibitor or 'statin'. It lowers the level of cholesterol and triglycerides in the blood. This drug may also reduce the risk of heart attack, stroke, or other health problems in patients with risk factors for heart or blood vessel disease. Diet and lifestyle changes are often used with this drug. This medicine may be used for other purposes; ask your health care provider or pharmacist if you have questions. COMMON BRAND NAME(S): Zocor What should I tell my health care provider before I take this medicine? They need to know if you have any of these conditions: -frequently drink alcoholic beverages -kidney disease -liver disease -muscle aches or weakness -other medical condition -an unusual or allergic reaction to simvastatin, other medicines, foods, dyes, or preservatives -pregnant or trying to get pregnant -breast-feeding How should I use this medicine? Take this medicine by mouth with a glass of water. Follow the directions on the prescription label. You can take this medicine with or without food. Take your doses at regular intervals. Do not take your medicine more often than directed. Talk to your pediatrician regarding the use of this medicine in children. Special care may be needed. Overdosage: If  you think you have taken too much of this medicine contact a poison control center or emergency room at once. NOTE: This medicine is only for you. Do not share this medicine with others. What if I miss a dose? If you miss a dose, take it as soon as you can. If it is almost time for your next dose, take only that dose. Do not take double or extra doses. What may interact with this medicine? Do not take this medicine with any of the following medications: -boceprevir -cyclosporine -danazol -gemfibrozil -medicines for fungal infections like itraconazole, ketoconazole, posaconazole, voriconazole -medicines for HIV infection -mifepristone, RU-486 -nefazodone -red yeast rice -some antibiotics like clarithromycin, erythromycin, telithromycin -telaprevir This medicine may also interact with the following medications: -alcohol -amiodarone -amlodipine -colchicine -digoxin -diltiazem -dronedarone -fluconazole -grapefruit juice -niacin -ranolazine -verapamil -warfarin This list may not describe all possible interactions. Give your health care provider a list of all the medicines, herbs, non-prescription drugs, or dietary supplements you use. Also tell them if you smoke, drink alcohol, or use illegal drugs. Some items may interact with your medicine. What should I watch for while using this medicine? Visit your doctor or health care professional for regular check-ups. You may need regular tests to make sure your liver is working properly. Tell your doctor or health care professional right away if you get any unexplained muscle pain, tenderness, or weakness, especially if you also have a fever and tiredness. Your doctor or health care professional may tell you to stop taking this medicine if you develop muscle problems. If your muscle problems do not go away after stopping this  medicine, contact your health care professional. This medicine may affect blood sugar levels. If you have diabetes,  check with your doctor or health care professional before you change your diet or the dose of your diabetic medicine. This drug is only part of a total heart-health program. Your doctor or a dietician can suggest a low-cholesterol and low-fat diet to help. Avoid alcohol and smoking, and keep a proper exercise schedule. Do not use this drug if you are pregnant or breast-feeding. Serious side effects to an unborn child or to an infant are possible. Talk to your doctor or pharmacist for more information. If you are going to have surgery tell your health care professional that you are taking this drug. Some drugs may increase the risk of side effects from simvastatin. If you are given certain antibiotics or antifungals, your doctor or health care professional may stop simvastatin for a short time. Check with your doctor or pharmacist for advice. What side effects may I notice from receiving this medicine? Side effects that you should report to your doctor or health care professional as soon as possible: -allergic reactions like skin rash, itching or hives, swelling of the face, lips, or tongue -confusion -dark urine -fever -joint pain -loss of memory -muscle cramps, pain -redness, blistering, peeling or loosening of the skin, including inside the mouth -trouble passing urine or change in the amount of urine -unusually weak or tired -yellowing of the eyes or skin Side effects that usually do not require medical attention (report to your doctor or health care professional if they continue or are bothersome): -constipation -heartburn -stomach gas, pain, upset This list may not describe all possible side effects. Call your doctor for medical advice about side effects. You may report side effects to FDA at 1-800-FDA-1088. Where should I keep my medicine? Keep out of the reach of children. Store at room temperature below 30 degrees C (86 degrees F). Keep container tightly closed. Throw away any unused  medicine after the expiration date. NOTE: This sheet is a summary. It may not cover all possible information. If you have questions about this medicine, talk to your doctor, pharmacist, or health care provider.  2015, Elsevier/Gold Standard. (2011-10-23 10:40:36)

## 2014-12-22 NOTE — Assessment & Plan Note (Addendum)
BP Readings from Last 3 Encounters:  12/22/14 152/98  12/14/14 148/83  10/28/14 162/98    Lab Results  Component Value Date   NA 137 12/14/2014   K 4.4 12/14/2014   CREATININE 0.82 12/14/2014    Assessment: Blood pressure control: mildly elevated Progress toward BP goal:  unchanged Comments: Persistently elevated last few appointments.  May be related to pain, but this is chronic for him.  Labs were stable in ER last week.  Plan: Medications:  Increase lisinopril to 20 mg daily.

## 2014-12-22 NOTE — Progress Notes (Signed)
Internal Medicine Clinic Attending  Case discussed with Dr. Moding at the time of the visit.  We reviewed the resident's history and exam and pertinent patient test results.  I agree with the assessment, diagnosis, and plan of care documented in the resident's note. 

## 2014-12-22 NOTE — Assessment & Plan Note (Signed)
Lab Results  Component Value Date   HGBA1C 7.4 10/22/2014   HGBA1C 7.3 04/19/2014   HGBA1C 8.4 01/18/2014     Assessment: Diabetes control: fair control Progress toward A1C goal:  unchanged Comments: Reports that he has been tolerating metformin better since taking on a full stomach.  Blood sugars reportedly 130s-150s.  Plan: Medications:  continue current medications: metformin 500 mg in morning and 1000 mg at night. Instruction/counseling given: reminded to bring blood glucose meter & log to each visit and reminded to bring medications to each visit

## 2014-12-22 NOTE — Progress Notes (Signed)
   Subjective:    [name], male    DOB: 1961-03-26, 54 y.o.   MRN: [id number]  HPI Donald Palmer is a 54 year old man with history of HTN, DM2, and chronic back pain presenting for routine follow up.  He reports having continued back pain.  He has been working with PT, and they were able to give him a TENS unit.  He says the TENS unit has been helping significantly.  He has also had chronic burning pain in his left abdomen that has been worse for the past couple of months.  It is worse with pressure on his stomach and occasionally occurs when he is sleeping.  He denies nausea, vomiting, constipation, or diarrhea.  The pain is not related to bowel movements of meals.  He went to the ER for the pain last week and a CT of his abdomen, urinalysis, and blood work was negative.  Review of Systems  Constitutional: Negative for fever, chills and fatigue.  HENT: Negative for congestion, rhinorrhea and sore throat.   Respiratory: Negative for cough and shortness of breath.   Cardiovascular: Negative for chest pain and leg swelling.  Gastrointestinal: Positive for abdominal pain. Negative for nausea, vomiting, diarrhea and constipation.  Genitourinary: Negative for dysuria and difficulty urinating.  Musculoskeletal: Positive for myalgias and back pain.  Skin: Negative for rash.  Neurological: Positive for numbness (In finges and toes along with left leg.). Negative for dizziness, weakness and headaches.       Objective:   Physical Exam  Constitutional: He is oriented to person, place, and time. He appears well-developed and well-nourished. No distress.  HENT:  Head: Normocephalic and atraumatic.  Mouth/Throat: No oropharyngeal exudate.  Eyes: Conjunctivae and EOM are normal. Pupils are equal, round, and reactive to light. No scleral icterus.  Cardiovascular: Normal rate, regular rhythm and normal heart sounds.   Pulmonary/Chest: Effort normal and breath sounds normal. No respiratory  distress. He has no wheezes.  Abdominal: Soft. Bowel sounds are normal. He exhibits no distension. There is tenderness (Left of midline, burning.).  Musculoskeletal: Normal range of motion. He exhibits tenderness (Lower back.). He exhibits no edema.  Straight leg raise negative.  Neurological: He is alert and oriented to person, place, and time. He has normal reflexes. No cranial nerve deficit. He exhibits normal muscle tone. Coordination normal.  Skin: Skin is warm and dry. No rash noted. No erythema.          Assessment & Plan:  Please see problem-based assessment and plan.

## 2014-12-22 NOTE — Assessment & Plan Note (Signed)
Reports significant improvement in his back pain with TENS unit and has been doing physical therapy and stretching, which has helped.  He has a prescription for Vicodin 7.5-325 mg, which he takes occasionally at bedtime.  He reports that is helps, but hopefully he can be weaned off of this now that he has his TENS unit. -Continue PT and TENS. -Continue Flexeril 10 mg BID PRN.  -Discuss need for continued Vicodin at next clinic visit.  If chronic, he will need to sign a pain contract.

## 2014-12-22 NOTE — Assessment & Plan Note (Signed)
I suspect his abdominal pain is most likely related to his diabetic neuropathy with normal labs, no other abdominal complaints, and negative CT.  It is possible that he could have some disk disease in his thoracic spine that could lead to radiculopathy, but it seems less likely.  He has responded to gabapentin in the past and tolerates it well. -Increase gabapentin to 900 mg TID. -Consider adding amitriptyline if symptoms not improved.

## 2014-12-22 NOTE — Assessment & Plan Note (Signed)
Lipid panel from 11/15 with LDL 109, total cholesterol 192.  His [place] 10-year CVD risk score is 31.4%, so he would benefit from statin therapy.  Will start for primary prevention. -Simvastatin 40 mg daily.

## 2015-01-17 ENCOUNTER — Other Ambulatory Visit: Payer: Self-pay | Admitting: *Deleted

## 2015-01-17 DIAGNOSIS — M545 Low back pain: Principal | ICD-10-CM

## 2015-01-17 DIAGNOSIS — G8929 Other chronic pain: Secondary | ICD-10-CM

## 2015-01-17 MED ORDER — HYDROCODONE-ACETAMINOPHEN 7.5-325 MG PO TABS: 1.0000 | ORAL_TABLET | Freq: Every evening | ORAL | Status: DC | PRN

## 2015-01-17 NOTE — Telephone Encounter (Signed)
He last got in Dec. Database OK. Would you ask how he uses it and last dose and make decision whether to UDS now or at F/U appt? Thanks

## 2015-01-20 ENCOUNTER — Emergency Department (HOSPITAL_COMMUNITY)
Admission: EM | Admit: 2015-01-20 | Discharge: 2015-01-20 | Disposition: A | Discharge status: Home / Self Care | Payer: Commercial Managed Care - HMO | Attending: Emergency Medicine | Admitting: Emergency Medicine

## 2015-01-20 ENCOUNTER — Encounter (HOSPITAL_COMMUNITY): Payer: Self-pay | Admitting: *Deleted

## 2015-01-20 DIAGNOSIS — G8929 Other chronic pain: Secondary | ICD-10-CM | POA: Diagnosis not present

## 2015-01-20 DIAGNOSIS — Z8719 Personal history of other diseases of the digestive system: Secondary | ICD-10-CM | POA: Diagnosis not present

## 2015-01-20 DIAGNOSIS — M545 Low back pain: Secondary | ICD-10-CM | POA: Diagnosis present

## 2015-01-20 DIAGNOSIS — Z8619 Personal history of other infectious and parasitic diseases: Secondary | ICD-10-CM | POA: Diagnosis not present

## 2015-01-20 DIAGNOSIS — Z720 Tobacco use: Secondary | ICD-10-CM | POA: Insufficient documentation

## 2015-01-20 DIAGNOSIS — I10 Essential (primary) hypertension: Secondary | ICD-10-CM | POA: Diagnosis not present

## 2015-01-20 DIAGNOSIS — Z79899 Other long term (current) drug therapy: Secondary | ICD-10-CM | POA: Insufficient documentation

## 2015-01-20 DIAGNOSIS — M5417 Radiculopathy, lumbosacral region: Secondary | ICD-10-CM | POA: Diagnosis not present

## 2015-01-20 DIAGNOSIS — M549 Dorsalgia, unspecified: Secondary | ICD-10-CM

## 2015-01-20 DIAGNOSIS — Z7951 Long term (current) use of inhaled steroids: Secondary | ICD-10-CM | POA: Insufficient documentation

## 2015-01-20 DIAGNOSIS — R200 Anesthesia of skin: Secondary | ICD-10-CM | POA: Diagnosis not present

## 2015-01-20 DIAGNOSIS — E119 Type 2 diabetes mellitus without complications: Secondary | ICD-10-CM | POA: Diagnosis not present

## 2015-01-20 MED ORDER — PREDNISONE 20 MG PO TABS: ORAL_TABLET | ORAL | Status: DC

## 2015-01-20 MED ORDER — KETOROLAC TROMETHAMINE 60 MG/2ML IM SOLN
60.0000 mg | Freq: Once | INTRAMUSCULAR | Status: AC
  Administered 2015-01-20: 60 mg via INTRAMUSCULAR
  Filled 2015-01-20: qty 2

## 2015-01-20 MED ORDER — HYDROCODONE-ACETAMINOPHEN 7.5-325 MG PO TABS: 1.0000 | ORAL_TABLET | Freq: Four times a day (QID) | ORAL | Status: DC | PRN

## 2015-01-20 MED ORDER — CYCLOBENZAPRINE HCL 10 MG PO TABS: 10.0000 mg | ORAL_TABLET | Freq: Two times a day (BID) | ORAL | Status: DC | PRN

## 2015-01-20 NOTE — ED Provider Notes (Signed)
CSN: [id number]     Arrival date & time 01/20/15  1017 History  This chart was scribed for non-physician practitioner, Domenic Moras, PA-C, working with Shaune Pollack, MD, by Stephania Fragmin, ED Scribe. This patient was seen in room TR09C/TR09C and the patient's care was started at 10:40 AM.    Chief Complaint  Patient presents with  . Back Pain   The history is provided by the patient. No language interpreter was used.     HPI Comments: Salbador Palmer is a 54 y.o. male with a history of DM and chronic back pain since 2007, who presents to the Emergency Department complaining of an episode of worsening, constant back pain that feels like "a locking sensation" radiating down his right leg, which began yesterday but has been intermittently chronic since 2007. He complains of associated numbness in right leg. Patient is able to ambulate. He states that his episodes of back pain usually lasts 2 days, which he normally treats with "medications y'all give me, but they don't work." Sitting exacerbates the pain. Pt tries hot shower with minimal relief, no other treatment tried.  Patient reports he has been to physical therapy, and he has seen a neurosurgeon in the past, who told him that he had a "disc problem." However, patient does not currently see a neurosurgeon. He denies bladder or bladder incontinence, or urinary symptoms, including hematuria. Patient drove here today. He denies a history of IV drug use or cancer.     Past Medical History  Diagnosis Date  . Diabetes mellitus   . Neuromuscular disorder   . Pancreatitis 01/09/2012  . Hypertension goal BP (blood pressure) < 140/80   . Hepatitis C     [id number]   No past surgical history on file. Family History  Problem Relation Age of Onset  . Diabetes Mother   . Hypertension Mother   . Hyperlipidemia Mother   . Diabetes Brother   . Hypertension Brother   . Heart attack Neg Hx   . Sudden death Neg Hx    History  Substance Use Topics  . Smoking  status: Current Some Day Smoker -- 0.10 packs/day    Types: Cigarettes    Last Attempt to Quit: 01/05/2012  . Smokeless tobacco: Never Used  . Alcohol Use: 3.0 oz/week    5 Cans of beer per week    Review of Systems  Genitourinary: Negative for dysuria, urgency, frequency, hematuria, decreased urine volume, enuresis and difficulty urinating.  Musculoskeletal: Positive for myalgias (right leg pain radiating down from back) and back pain.  Neurological: Positive for numbness (right leg).      Allergies  Review of patient's allergies indicates no known allergies.  Home Medications   Prior to Admission medications   Medication Sig Start Date End Date Taking? Authorizing Provider  Blood Glucose Monitoring Suppl (ACCU-CHEK AVIVA PLUS) W/DEVICE KIT 1 each by Does not apply route 2 (two) times daily. To check blood sugar twice daily. diag code 250.60. Non insulin dependent 10/19/13   Ivor Costa, MD  cyclobenzaprine (FLEXERIL) 10 MG tablet Take 1 tablet (10 mg total) by mouth 2 (two) times daily as needed for muscle spasms. 04/14/14   Ivor Costa, MD  fluticasone (FLONASE) 50 MCG/ACT nasal spray Place 2 sprays into both nostrils daily. 10/22/14   Arman Filter, MD  gabapentin (NEURONTIN) 300 MG capsule Take 3 capsules (900 mg total) by mouth 3 (three) times daily. 12/22/14   Arman Filter, MD  glucose blood (ACCU-CHEK AVIVA PLUS) test  strip To use twice daily to check blood sugar. diag code 250.60. Non insulin dependent 10/22/14   Arman Filter, MD  HYDROcodone-acetaminophen ([place]) 7.5-325 MG per tablet Take 1 tablet by mouth at bedtime as needed for moderate pain. 01/17/15   [name], MD  ibuprofen (ADVIL,MOTRIN) 800 MG tablet Take 1 tablet (800 mg total) by mouth every 8 (eight) hours as needed for moderate pain. 10/28/14   Wilber Oliphant, MD  lisinopril (PRINIVIL,ZESTRIL) 20 MG tablet Take 1 tablet (20 mg total) by mouth daily. 12/22/14   Arman Filter, MD  metFORMIN (GLUCOPHAGE) 1000  MG tablet Take 0.5 tablets in the morning and 1 tablet at night. Patient taking differently: Take 1,000 mg by mouth 2 (two) times daily with a meal.  10/28/14   Wilber Oliphant, MD  omeprazole (PRILOSEC) 20 MG capsule Take 1 capsule (20 mg total) by mouth daily. 10/19/13   Ivor Costa, MD  simvastatin (ZOCOR) 40 MG tablet Take 1 tablet (40 mg total) by mouth every evening. 12/22/14 12/22/15  Arman Filter, MD   There were no vitals taken for this visit. Physical Exam  Constitutional: He is oriented to person, place, and time. He appears well-developed and well-nourished. No distress.  HENT:  Head: Normocephalic and atraumatic.  Eyes: Conjunctivae and EOM are normal.  Neck: Neck supple. No tracheal deviation present.  Cardiovascular: Normal rate and intact distal pulses.   Pulmonary/Chest: Effort normal. No respiratory distress.  Musculoskeletal: Normal range of motion. He exhibits tenderness.  Right paralumbar spinal tenderness. Right lumbosacral tenderness.  Patellar tendon reflexes intact. No foot drop.  Positive straight leg raise on the right side.   Neurological: He is alert and oriented to person, place, and time. He has normal reflexes.  Skin: Skin is warm and dry.  Psychiatric: He has a normal mood and affect. His behavior is normal.  Nursing note and vitals reviewed.   ED Course  Procedures (including critical care time)  DIAGNOSTIC STUDIES: Oxygen Saturation is 100% on room air, normal by my interpretation.    COORDINATION OF CARE: 10:48 AM - Discussed treatment plan with pt at bedside which includes pain medications, including a Toradol injection given here, and pt agreed to plan.  Pt has sxs consistent with lumbosacral radiculopathy.  This is acute on chronic pain.  No red flags.  Pt able to ambulate.  Will prescribe prednisone/flexeril/vicodin.  Pt made aware prednisone can elevate his CBG, therefore close monitoring.   bp is high today.  Recommend having it recheck by pcp  in 1 week.    Medications  ketorolac (TORADOL) injection 60 mg (not administered)      MDM   Final diagnoses:  Lumbosacral radiculopathy   BP 183/54 mmHg  Pulse 87  Temp(Src) 97.3 F (36.3 C) (Oral)  Resp 20  Ht _0  (1.753 m)  Wt 180 lb (81.647 kg)  BMI 26.57 kg/m2  SpO2 100%   I personally performed the services described in this documentation, which was scribed in my presence. The recorded information has been reviewed and is accurate.      Domenic Moras, PA-C 01/20/15 Tilden Ray, MD 01/21/15 [phone]

## 2015-01-20 NOTE — Discharge Instructions (Signed)
Take medication as prescribed.  Follow up with your doctor for further care.  Monitor your blood sugar closely as prednisone can elevate your blood sugar.    Lumbosacral Radiculopathy Lumbosacral radiculopathy is a pinched nerve or nerves in the low back (lumbosacral area). When this happens you may have weakness in your legs and may not be able to stand on your toes. You may have pain going down into your legs. There may be difficulties with walking normally. There are many causes of this problem. Sometimes this may happen from an injury, or simply from arthritis or boney problems. It may also be caused by other illnesses such as diabetes. If there is no improvement after treatment, further studies may be done to find the exact cause. DIAGNOSIS  X-rays may be needed if the problems become long standing. Electromyograms may be done. This study is one in which the working of nerves and muscles is studied. HOME CARE INSTRUCTIONS   Applications of ice packs may be helpful. Ice can be used in a plastic bag with a towel around it to prevent frostbite to skin. This may be used every 2 hours for 20 to 30 minutes, or as needed, while awake, or as directed by your caregiver.  Only take over-the-counter or prescription medicines for pain, discomfort, or fever as directed by your caregiver.  If physical therapy was prescribed, follow your caregiver's directions. SEEK IMMEDIATE MEDICAL CARE IF:   You have pain not controlled with medications.  You seem to be getting worse rather than better.  You develop increasing weakness in your legs.  You develop loss of bowel or bladder control.  You have difficulty with walking or balance, or develop clumsiness in the use of your legs.  You have a fever. MAKE SURE YOU:   Understand these instructions.  Will watch your condition.  Will get help right away if you are not doing well or get worse. Document Released: 12/03/2005 Document Revised: 02/25/2012  Document Reviewed: 07/23/2008 [facility] Patient Information 2015 [place], [place]. This information is not intended to replace advice given to you by your health care provider. Make sure you discuss any questions you have with your health care provider.

## 2015-01-20 NOTE — ED Notes (Signed)
Pt history of buldge disc. Pain increased last night

## 2015-02-01 ENCOUNTER — Encounter: Payer: Self-pay | Admitting: Physical Medicine & Rehabilitation

## 2015-02-08 ENCOUNTER — Ambulatory Visit
Admission: RE | Admit: 2015-02-08 | Discharge: 2015-02-08 | Disposition: A | Discharge status: Home / Self Care | Payer: Commercial Managed Care - HMO | Source: Ambulatory Visit | Attending: Physical Medicine & Rehabilitation | Admitting: Physical Medicine & Rehabilitation

## 2015-02-08 ENCOUNTER — Other Ambulatory Visit (HOSPITAL_COMMUNITY)
Admission: RE | Admit: 2015-02-08 | Discharge: 2015-02-08 | Disposition: A | Discharge status: Home / Self Care | Payer: Commercial Managed Care - HMO | Source: Ambulatory Visit | Attending: Internal Medicine | Admitting: Internal Medicine

## 2015-02-08 ENCOUNTER — Encounter
Payer: Commercial Managed Care - HMO | Attending: Physical Medicine & Rehabilitation | Admitting: Physical Medicine & Rehabilitation

## 2015-02-08 ENCOUNTER — Encounter: Payer: Self-pay | Admitting: Physical Medicine & Rehabilitation

## 2015-02-08 ENCOUNTER — Ambulatory Visit (INDEPENDENT_AMBULATORY_CARE_PROVIDER_SITE_OTHER): Payer: Commercial Managed Care - HMO | Admitting: Internal Medicine

## 2015-02-08 ENCOUNTER — Other Ambulatory Visit: Payer: Self-pay | Admitting: Physical Medicine & Rehabilitation

## 2015-02-08 ENCOUNTER — Encounter: Payer: Self-pay | Admitting: Internal Medicine

## 2015-02-08 VITALS — BP 165/99 | HR 92 | Temp 98.1°F | Ht 69.0 in | Wt 176.0 lb

## 2015-02-08 VITALS — BP 150/94 | HR 86 | Resp 14

## 2015-02-08 DIAGNOSIS — M47816 Spondylosis without myelopathy or radiculopathy, lumbar region: Secondary | ICD-10-CM

## 2015-02-08 DIAGNOSIS — Z79899 Other long term (current) drug therapy: Secondary | ICD-10-CM

## 2015-02-08 DIAGNOSIS — M545 Low back pain: Secondary | ICD-10-CM | POA: Diagnosis not present

## 2015-02-08 DIAGNOSIS — M79606 Pain in leg, unspecified: Secondary | ICD-10-CM | POA: Insufficient documentation

## 2015-02-08 DIAGNOSIS — E1144 Type 2 diabetes mellitus with diabetic amyotrophy: Secondary | ICD-10-CM | POA: Diagnosis not present

## 2015-02-08 DIAGNOSIS — Z5181 Encounter for therapeutic drug level monitoring: Secondary | ICD-10-CM

## 2015-02-08 DIAGNOSIS — E1142 Type 2 diabetes mellitus with diabetic polyneuropathy: Secondary | ICD-10-CM | POA: Insufficient documentation

## 2015-02-08 DIAGNOSIS — E1140 Type 2 diabetes mellitus with diabetic neuropathy, unspecified: Secondary | ICD-10-CM | POA: Diagnosis not present

## 2015-02-08 DIAGNOSIS — M5416 Radiculopathy, lumbar region: Secondary | ICD-10-CM | POA: Diagnosis not present

## 2015-02-08 DIAGNOSIS — Z113 Encounter for screening for infections with a predominantly sexual mode of transmission: Secondary | ICD-10-CM | POA: Diagnosis not present

## 2015-02-08 DIAGNOSIS — M25559 Pain in unspecified hip: Secondary | ICD-10-CM | POA: Insufficient documentation

## 2015-02-08 DIAGNOSIS — Z7901 Long term (current) use of anticoagulants: Secondary | ICD-10-CM

## 2015-02-08 DIAGNOSIS — B182 Chronic viral hepatitis C: Secondary | ICD-10-CM

## 2015-02-08 DIAGNOSIS — G8929 Other chronic pain: Secondary | ICD-10-CM | POA: Diagnosis not present

## 2015-02-08 LAB — IRON: Iron: 128 ug/dL (ref 42–165)

## 2015-02-08 MED ORDER — BACLOFEN 10 MG PO TABS
10.0000 mg | ORAL_TABLET | Freq: Three times a day (TID) | ORAL | Status: DC | PRN
Start: 2015-02-08 — End: 2015-08-26

## 2015-02-08 MED ORDER — CELECOXIB 200 MG PO CAPS: 200.0000 mg | ORAL_CAPSULE | Freq: Every day | ORAL | Status: DC

## 2015-02-08 NOTE — Progress Notes (Signed)
   Subjective:    [name], male    DOB: 1960-12-29, 54 y.o.   MRN: [id number]  HPI Mr. Ellithorpe is a 54 year old male with hypertension, type 2 diabetes complicated by diabetic neuropathy, hyperlipidemia who presents today for an STI check. Please see assessment & plan for documentation of each problem.   Review of Systems  Constitutional: Negative for fever and chills.  Respiratory: Negative for shortness of breath.   Cardiovascular: Negative for chest pain and leg swelling.  Gastrointestinal: Negative for nausea, vomiting, abdominal pain, diarrhea and blood in stool.  Genitourinary: Positive for dysuria and testicular pain. Negative for flank pain, discharge and genital sores.  Neurological: Negative for dizziness.       Objective:   Physical Exam Constitutional: He is oriented to person, place, and time. Thin, African-American male. No distress.  HENT:  Head: Normocephalic and atraumatic.  Eyes: Conjunctivae are normal.  Cardiovascular: Normal rate, regular rhythm and normal heart sounds.  Exam reveals no gallop and no friction rub.   No murmur heard. Pulmonary/Chest: Effort normal. No respiratory distress. He has no wheezes. He has no rales.  Genitourinary: No skin deformities noted. Some tenderness with palpation overlying right groin and inguinal area. Testicles without overt deformities or masses palpable. No discharge noted or expressed.  Neurological: He is alert and oriented to person, place, and time. Coordination normal.  Skin: Skin is warm and dry. He is not diaphoretic.  Psychiatric: His behavior is normal.        Assessment & Plan:

## 2015-02-08 NOTE — Progress Notes (Signed)
Subjective:    [name], male    DOB: [birth year], 54 y.o.   MRN: [id number]  HPI   Donald Palmer is here with complaints of chronic hip, leg and back pain which have been worsening since 2007 when he was diagnosed with diabetes.  He states his sugars are up and down. His latest HgbA1c from 10/2014 was 7.4.  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  His pain is continuous. He can not isolate which positions or activities increase the pain. The pain location itself can vary also.  He was in the ED a couple weeks ago and received a toradol shot which relieved his pain for 2 weeks.   I found an MRI from 2013 which revealed:  L1-2: Negative  L2-3: Negative  L3-4: Negative  L4-5: Mild disc bulging and early vertebral spurring. Mild foraminal narrowing bilaterally.  L5-S1: Mild disc bulging without stenosis.  For pain relief he's doing some basic stretching and exercising which he does typically every other day or so. He takes flexeril, but it doesn't help much.  He is prescribed hydrocodone by his PCP--he takes one per day prn. He uses gabapentin which has helped his DPN pain---900mg  TID is his dose.  He was also on ibuprofen, but it caused nausea and vomiting---so he has stopped this.     Pain Inventory Average Pain 9 Pain Right Now 9 My pain is sharp, burning, stabbing, tingling and aching  In the last 24 hours, has pain interfered with the following? General activity 8 Relation with others 6 Enjoyment of life 9 What TIME of day is your pain at its worst? night Sleep (in general) Fair  Pain is worse with: walking, bending, sitting and standing Pain improves with: heat/ice and injections Relief from Meds: 0  Mobility use a cane how many minutes can you walk? 20 ability to climb steps?  no do you drive?  yes Do you have any goals in this area?  yes  Function disabled:  date disabled 09/2013  Neuro/Psych weakness numbness tingling trouble walking spasms  Prior Studies new visit  Physicians involved in your care new visit   Family History  Problem Relation Age of Onset  . Diabetes Mother   . Hypertension Mother   . Hyperlipidemia Mother   . Diabetes Brother   . Hypertension Brother   . Heart attack Neg Hx   . Sudden death Neg Hx    History   Social History  . Marital Status: Single    Spouse Name: N/A  . Number of Children: N/A  . Years of Education: N/A   Social History Main Topics  . Smoking status: Current Some Day Smoker -- 0.10 packs/day    Types: Cigarettes    Last Attempt to Quit: 01/05/2012  . Smokeless tobacco: Never Used  . Alcohol Use: 3.0 oz/week    5 Cans of beer per week  . Drug Use: No  . Sexual Activity: Yes    Birth Control/ Protection: None   Other Topics Concern  . None   Social History Narrative   History reviewed. No pertinent past surgical history. Past Medical History  Diagnosis Date  . Diabetes mellitus   . Neuromuscular disorder   . Pancreatitis 01/09/2012  . Hypertension goal BP (blood pressure) < 140/80   . Hepatitis C     [id number]   BP 150/94 mmHg  Pulse 86  Resp 14  SpO2 99%  Opioid Risk Score: 0 Fall Risk Score:    Review of Systems  Constitutional:       Weight loss  Respiratory: Positive for cough and shortness of breath.   Gastrointestinal: Positive for vomiting and abdominal pain.  Endocrine:       High blood sugar Low blood sugar  Musculoskeletal: Positive for gait problem.  Neurological: Positive for weakness and numbness.       Tingling spasms  All other systems reviewed and are negative.      Objective:   Physical Exam   General: Alert and oriented x 3, No apparent distress. Cooperative with exam but needed to be re-directed numerous times to stay on task. HEENT: Head is normocephalic, atraumatic, PERRLA, EOMI, sclera anicteric, oral mucosa pink and moist,  dentition intact, ext ear canals clear,  Neck: Supple without JVD or lymphadenopathy Heart: Reg rate and rhythm. No murmurs rubs or gallops Chest: CTA bilaterally without wheezes, rales, or rhonchi; no distress Abdomen: Soft, non-tender, non-distended, bowel sounds positive. Extremities: No clubbing, cyanosis, or edema. Pulses are 2+ Skin: Clean and intact without signs of breakdown Neuro: Pt is cognitively appropriate with normal insight, memory, and awareness. Cranial nerves 2-12 are intact. Sensory exam is decreased in a stocking glove distribution in both legs below the knees as well as the finger tips. He has some sensory loss along the medial right knee.. Reflexes are 2+ in all 4's. Fine motor coordination is intact. No tremors. Motor function is grossly 5/5 except for pain inhibition in the right leg..  Musculoskeletal:  He is limited in lumbar flexion as well as side bending. Facet maneuvers were equivocal on the right. Extension provided only mild pain. SLR was equiovcal, FABER was negative. Cross legged maneuver neg, compression test negative. Prone lying did not increase pain. The most pain he had was with palpation of the right PSIS. With palpation there was some radiation into the right buttock.  Right knee is non-tender with AROM or PROM. Meniscal maneuvers were negative. He had mild tenderness with palpation of medial knee on right. Gait appeared generally normal. No gross spinal or pelvic symmetry abnl.  Psych: Pt's affect is appropriate. Pt is cooperative, a little high strung.        Assessment & Plan:  1. Chronic low back pain. ?facet arthropathy on right L5-S1---exam not consistent 2. 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 Relatively normal lumbar MRI from 2013 4. Diabetic polyneuropathy  Plan: 1. Celebrex trial, 200mg  qd 2. Robaxin prn for muscle spasm 3. Lumbar xray to assess facets and discs 4. UDS was collected today. Can rx narcotics if consistent and  needed.  5. Continue gabapentin, 900mg  3x daily for referred, neuro pain 6. Follow up with me in about 6 weeks. Forty-five minutes of face to face patient care time were spent during this visit. All questions were encouraged and answered.

## 2015-02-08 NOTE — Patient Instructions (Signed)

## 2015-02-08 NOTE — Assessment & Plan Note (Addendum)
Overview -Reports he is no longer sexually active with his male partner, last sexual encounter was back in January, after he found out she was sleeping with 2 older gentleman -Does not recall that her partner ever was tested or reported having STI's -Angry about her infidelity but wants to make sure that his health is otherwise uncompromised -Denies any new rashes, skin changes, lesions, urethral discharge, fever, chills, nausea, prior history of STI's though notes some dysuria and chronic but stable back pain which he is to see pain medicine later today  Assessment -Possible exposure in the setting of unprotected sex  Plan -Plan to check HIV, GC chlamydia, RPR, urinalysis, urine culture

## 2015-02-08 NOTE — Assessment & Plan Note (Addendum)
Overview -Recalls being told that he had hepatitis C but thought he was cured given that he was vaccinated -Unaware that he needed to see an infectious disease doctor for complete resolution of viral illness  Assessment -Chronic hepatitis C infection first discovered in 2013 that has not been treated  Plan -Check hepatitis C panel with plan to refer to ID clinic  ADDENDUM 02/16/2015  7:59 PM:  As his PCR resulted positive for hepatitis C, I will add on the genotype lab.

## 2015-02-08 NOTE — Patient Instructions (Addendum)
  Please try to bring all your medicines next time. This will help [place] keep you safe from mistakes.  If anything test positive, we will give you a call and instructions on where to get your medicine. We have also ordered all the test today for hepatitis.

## 2015-02-08 NOTE — Progress Notes (Signed)
Internal Medicine Clinic Attending  Case discussed with Dr. Patel at the time of the visit.  We reviewed the resident's history and exam and pertinent patient test results.  I agree with the assessment, diagnosis, and plan of care documented in the resident's note.  

## 2015-02-09 ENCOUNTER — Telehealth: Payer: Self-pay | Admitting: Physical Medicine & Rehabilitation

## 2015-02-09 LAB — HEPATITIS B SURFACE ANTIBODY,QUALITATIVE: HEP B S AB: NEGATIVE

## 2015-02-09 LAB — URINE CYTOLOGY ANCILLARY ONLY
CHLAMYDIA, DNA PROBE: NEGATIVE
NEISSERIA GONORRHEA: NEGATIVE

## 2015-02-09 LAB — ANA: Anti Nuclear Antibody(ANA): NEGATIVE

## 2015-02-09 LAB — HEPATITIS A ANTIBODY, TOTAL: Hep A Total Ab: REACTIVE — AB

## 2015-02-09 LAB — URINALYSIS, ROUTINE W REFLEX MICROSCOPIC
Glucose, UA: 500 mg/dL — AB
Hgb urine dipstick: NEGATIVE
LEUKOCYTES UA: NEGATIVE
NITRITE: NEGATIVE
PH: 6 (ref 5.0–8.0)
Protein, ur: NEGATIVE mg/dL
SPECIFIC GRAVITY, URINE: 1.028 (ref 1.005–1.030)
UROBILINOGEN UA: 1 mg/dL (ref 0.0–1.0)

## 2015-02-09 LAB — HEPATITIS C RNA QUANTITATIVE
HCV Quantitative Log: 6.99 {Log} — ABNORMAL HIGH (ref <1.18)
HCV Quantitative: 9776410 IU/mL — ABNORMAL HIGH (ref <15)

## 2015-02-09 LAB — HEPATITIS B CORE ANTIBODY, TOTAL: HEP B C TOTAL AB: NONREACTIVE

## 2015-02-09 LAB — HIV ANTIBODY (ROUTINE TESTING W REFLEX): HIV: NONREACTIVE

## 2015-02-09 LAB — RPR

## 2015-02-09 LAB — PMP ALCOHOL METABOLITE (ETG): Ethyl Glucuronide (EtG): NEGATIVE ng/mL

## 2015-02-09 NOTE — Telephone Encounter (Signed)
Lumbar xrays are essentially normal in appearance---continue with exercises and meds as recommended.

## 2015-02-10 LAB — URINE CULTURE
Colony Count: NO GROWTH
Organism ID, Bacteria: NO GROWTH

## 2015-02-10 LAB — POCT INR: INR: 1

## 2015-02-10 NOTE — Telephone Encounter (Signed)
Called patient and informed

## 2015-02-11 LAB — PRESCRIPTION MONITORING PROFILE (SOLSTAS)
Amphetamine/Meth: NEGATIVE ng/mL
BUPRENORPHINE, URINE: NEGATIVE ng/mL
Barbiturate Screen, Urine: NEGATIVE ng/mL
Benzodiazepine Screen, Urine: NEGATIVE ng/mL
CARISOPRODOL, URINE: NEGATIVE ng/mL
Cocaine Metabolites: NEGATIVE ng/mL
Creatinine, Urine: 278.27 mg/dL (ref >20.0)
FENTANYL URINE: NEGATIVE ng/mL
MDMA URINE: NEGATIVE ng/mL
Meperidine, Ur: NEGATIVE ng/mL
Methadone Screen, Urine: NEGATIVE ng/mL
Nitrites, Initial: NEGATIVE ug/mL
Opiate Screen, Urine: NEGATIVE ng/mL
Oxycodone Screen, Ur: NEGATIVE ng/mL
Propoxyphene: NEGATIVE ng/mL
Tapentadol, urine: NEGATIVE ng/mL
Tramadol Scrn, Ur: NEGATIVE ng/mL
ZOLPIDEM, URINE: NEGATIVE ng/mL
pH, Initial: 5.9 pH (ref 4.5–8.9)

## 2015-02-11 LAB — CANNABANOIDS (GC/LC/MS), URINE: THC-COOH UR CONFIRM: 407 ng/mL — AB (ref <5)

## 2015-02-16 ENCOUNTER — Telehealth: Payer: Self-pay | Admitting: *Deleted

## 2015-02-16 NOTE — Addendum Note (Signed)
Addended by: Riccardo Dubin on: 02/16/2015 07:59 PM   Modules accepted: Orders

## 2015-02-16 NOTE — Telephone Encounter (Signed)
Called pt, gave him dr patel's advice, he was agreeable

## 2015-02-16 NOTE — Telephone Encounter (Signed)
-----   Message from Charlott Rakes, MD sent at 02/16/2015 12:52 AM EST ----- I received a message from Dr. Daryll Drown regarding this patient as he was asking about donating plasma. Since he is a carrier of hepatitis C and has not been treated, he should be advised not to donate any blood products.

## 2015-02-18 NOTE — Progress Notes (Signed)
Urine drug screen for this encounter is inconsistent.  Positive for marijuana.

## 2015-02-22 LAB — HEPATITIS C GENOTYPE

## 2015-03-02 ENCOUNTER — Ambulatory Visit (INDEPENDENT_AMBULATORY_CARE_PROVIDER_SITE_OTHER): Payer: Commercial Managed Care - HMO | Admitting: Internal Medicine

## 2015-03-02 ENCOUNTER — Telehealth: Payer: Self-pay | Admitting: *Deleted

## 2015-03-02 ENCOUNTER — Encounter: Payer: Self-pay | Admitting: Internal Medicine

## 2015-03-02 VITALS — BP 137/97 | HR 85 | Temp 98.2°F | Wt 172.1 lb

## 2015-03-02 DIAGNOSIS — K921 Melena: Secondary | ICD-10-CM | POA: Diagnosis not present

## 2015-03-02 DIAGNOSIS — K219 Gastro-esophageal reflux disease without esophagitis: Secondary | ICD-10-CM

## 2015-03-02 DIAGNOSIS — R195 Other fecal abnormalities: Secondary | ICD-10-CM

## 2015-03-02 LAB — CBC WITH DIFFERENTIAL/PLATELET
BASOS ABS: 0 10*3/uL (ref 0.0–0.1)
BASOS PCT: 0 % (ref 0–1)
Eosinophils Absolute: 0 10*3/uL (ref 0.0–0.7)
Eosinophils Relative: 0 % (ref 0–5)
HCT: 42.5 % (ref 39.0–52.0)
Hemoglobin: 14.2 g/dL (ref 13.0–17.0)
LYMPHS PCT: 57 % — AB (ref 12–46)
Lymphs Abs: 3.9 10*3/uL (ref 0.7–4.0)
MCH: 24.3 pg — ABNORMAL LOW (ref 26.0–34.0)
MCHC: 33.4 g/dL (ref 30.0–36.0)
MCV: 72.8 fL — ABNORMAL LOW (ref 78.0–100.0)
Monocytes Absolute: 0.5 10*3/uL (ref 0.1–1.0)
Monocytes Relative: 7 % (ref 3–12)
NEUTROS ABS: 2.4 10*3/uL (ref 1.7–7.7)
NEUTROS PCT: 35 % — AB (ref 43–77)
Platelets: 226 10*3/uL (ref 150–400)
RBC: 5.84 MIL/uL — ABNORMAL HIGH (ref 4.22–5.81)
RDW: 14.1 % (ref 11.5–15.5)
WBC: 6.9 10*3/uL (ref 4.0–10.5)

## 2015-03-02 MED ORDER — OMEPRAZOLE 40 MG PO CPDR: 40.0000 mg | DELAYED_RELEASE_CAPSULE | Freq: Every day | ORAL | Status: DC

## 2015-03-02 NOTE — Progress Notes (Signed)
   Subjective:    [name], male    DOB: [birth year], 54 y.o.   MRN: [id number]  HPI Pt is a 54 y/o male w/ PMHx of HTN, GERD, and hep C who presents for acute visit for black stools. States last Tuesday 3/8 pt ate [employer]. He then had an upset stomach and had diarrhea. Initially stools were light green but turned tarry black. He thought stools would return to normal color over the weekend however they have persisted and he came to clinic today. On 3/8 he did vomit due to upset stomach and noted small amt of blood in vomit. Denies bright red blood in stool. He took pepto bismal Tuesday night and finished half the bottle since then. He does not have hx of stomach ulcer and is on prilosec 20mg  for acid reflux.  He has chronic left sided abdominal w/ neg abdominal CT on 12/14/2014. Has not had a colonoscopy before.    Review of Systems  Constitutional: Negative for fever and chills.  Respiratory: Negative for shortness of breath.   Cardiovascular: Negative for chest pain.  Gastrointestinal: Positive for abdominal pain and diarrhea. Negative for rectal pain.       Black stools  Neurological: Negative for dizziness, weakness and light-headedness.       Objective:   Physical Exam  Constitutional: He appears well-developed and well-nourished. No distress.  HENT:  Head: Normocephalic.  Mouth/Throat: No oropharyngeal exudate.  Mildly erythematous oropharynx  Cardiovascular: Normal rate and regular rhythm.   Pulmonary/Chest: Effort normal and breath sounds normal.  Abdominal: Soft. Bowel sounds are normal. He exhibits distension (left upper quad).  Genitourinary: Rectum normal and prostate normal. Guaiac negative stool.  Neg for external or internal hemorrhoids  Skin: Skin is warm and dry.          Assessment & Plan:

## 2015-03-02 NOTE — Telephone Encounter (Signed)
Donald Palmer needs seen, preferably this week. He is on [facility] 2, had prednisone in Feb, Chronic Hep C (no indication cirrhosis on most recent CT).   [facility] appt this week.

## 2015-03-02 NOTE — Telephone Encounter (Signed)
Pt called to report that he had [employer] last Tuesday. That night he  had a unset stomach. On Tuesday his Bowel Movements were light green but now they are  black. They have stayed black for 5 days.   Pt called back and he states his BM's are completely black. No abdominal pain, stools are looser than normal. Eating normal with no other c/o.  Next scheduled visit on 3/31

## 2015-03-02 NOTE — Telephone Encounter (Signed)
Will come in today °

## 2015-03-03 DIAGNOSIS — K921 Melena: Secondary | ICD-10-CM | POA: Insufficient documentation

## 2015-03-03 NOTE — Assessment & Plan Note (Signed)
As stated in HPI pt has had black stools since he ate [employer] 3/8 and subsequently developed an upset stomach which he took pepto bismal for. Orthostatic vitals neg, hemoglobin stable (14.2 today from 14.5 in Dec 2015). Rectal exam neg for hemorrhoids, was able to retrieve black tarry stool for guaiac card which was negative.   - likely black stool from pepto bismal. Increased prilosec to 40mg  daily and placed referral for GI is due for colonoscopy. Also can consider EGD at that time.  - pt has appt with clinic on 3/31 at which time can check of stools have returned to baseline.

## 2015-03-04 NOTE — Progress Notes (Signed)
Internal Medicine Clinic Attending  Case discussed with Dr. Truong at the time of the visit.  We reviewed the resident's history and exam and pertinent patient test results.  I agree with the assessment, diagnosis, and plan of care documented in the resident's note.  

## 2015-03-17 ENCOUNTER — Encounter: Payer: Self-pay | Admitting: Internal Medicine

## 2015-03-17 ENCOUNTER — Ambulatory Visit (INDEPENDENT_AMBULATORY_CARE_PROVIDER_SITE_OTHER): Payer: Commercial Managed Care - HMO | Admitting: Internal Medicine

## 2015-03-17 VITALS — BP 136/78 | HR 84 | Temp 98.3°F | Wt 172.8 lb

## 2015-03-17 DIAGNOSIS — Z23 Encounter for immunization: Secondary | ICD-10-CM | POA: Diagnosis not present

## 2015-03-17 DIAGNOSIS — E1140 Type 2 diabetes mellitus with diabetic neuropathy, unspecified: Secondary | ICD-10-CM | POA: Diagnosis not present

## 2015-03-17 DIAGNOSIS — G894 Chronic pain syndrome: Secondary | ICD-10-CM

## 2015-03-17 DIAGNOSIS — E785 Hyperlipidemia, unspecified: Secondary | ICD-10-CM | POA: Diagnosis not present

## 2015-03-17 DIAGNOSIS — I10 Essential (primary) hypertension: Secondary | ICD-10-CM | POA: Diagnosis not present

## 2015-03-17 DIAGNOSIS — B182 Chronic viral hepatitis C: Secondary | ICD-10-CM

## 2015-03-17 DIAGNOSIS — E1169 Type 2 diabetes mellitus with other specified complication: Secondary | ICD-10-CM

## 2015-03-17 DIAGNOSIS — J309 Allergic rhinitis, unspecified: Secondary | ICD-10-CM

## 2015-03-17 DIAGNOSIS — G8929 Other chronic pain: Secondary | ICD-10-CM

## 2015-03-17 DIAGNOSIS — Z0000 Encounter for general adult medical examination without abnormal findings: Secondary | ICD-10-CM

## 2015-03-17 DIAGNOSIS — E0842 Diabetes mellitus due to underlying condition with diabetic polyneuropathy: Secondary | ICD-10-CM

## 2015-03-17 DIAGNOSIS — M545 Low back pain: Secondary | ICD-10-CM

## 2015-03-17 DIAGNOSIS — K219 Gastro-esophageal reflux disease without esophagitis: Secondary | ICD-10-CM

## 2015-03-17 DIAGNOSIS — E1142 Type 2 diabetes mellitus with diabetic polyneuropathy: Secondary | ICD-10-CM

## 2015-03-17 LAB — POCT GLYCOSYLATED HEMOGLOBIN (HGB A1C): Hemoglobin A1C: 7

## 2015-03-17 LAB — GLUCOSE, CAPILLARY: Glucose-Capillary: 167 mg/dL — ABNORMAL HIGH (ref 70–99)

## 2015-03-17 MED ORDER — HYDROCODONE-ACETAMINOPHEN 7.5-325 MG PO TABS: 1.0000 | ORAL_TABLET | Freq: Four times a day (QID) | ORAL | Status: DC | PRN

## 2015-03-17 NOTE — Assessment & Plan Note (Signed)
Flonase controls his sxs quite well.

## 2015-03-17 NOTE — Assessment & Plan Note (Signed)
He was on PPI 20 QD Prilosec but with black tarry stools and both COX2 and NSAID and prednisone, Dr Hulen Luster appropriate increased him to 40 QD. Pt had bottle of 20 mg and states mostly takes one, occ takes 2. Guaiac was negative. He is no longer seeing dark stools. Therefore, I am OK with leaving him at 20QD.   Ulis Rias to F/U GI referral as needs colon.

## 2015-03-17 NOTE — Assessment & Plan Note (Addendum)
He has chronic Hep C (first noted in 2013) and W/U last appt to prepare him for [facility] appt 4/11. He has had all required testing prior to his Hep C appt at [facility] except the U/S. This was ordered but not sch. We attempted to sch it today but he had already eaten. First available appt was April 12th. I talked to Maudie Mercury to try to get his [facility] appt resch for after the [place] but that was not possible.   He had a lot of questions. I reviewed RF and he stated was in jail and got jail tattoos. Asked about condom use and I explained controversial but if in stable monogamous relationship, could discuss not using condoms. Stressed condoms needed used for every "casual" encounter. He remembered getting vaccinated and I explained that was for Hep B and corrected him that B does not turn into C. We discussed that the Hep B vaccination "didn't take" and he agreed to restart the vaccination series. Explained [facility] would determine if he needed tx.  We also reviewed his STD testing from prior appt - all negative.

## 2015-03-17 NOTE — Patient Instructions (Addendum)
1. You got the hepatitis B vaccine today. Pls return in 30 days to get the 2nd shot and in 6 months to get the 3rd.  2. You have chronic hepatitis C. This puts you at risk for liver failure and liver cancer. You need to get the ultrasound of your liver before April 11th. See the ID doctor on the 11th. He will decide if you need treatment. Do NOT drink alcohol as this also damages your liver.  3. Your diabetes and blood pressure are great.  4. I gave you one paper script for hydrocodone. You cannot get this med from any other doctor. You must stop the marijuana.

## 2015-03-17 NOTE — Assessment & Plan Note (Signed)
BP at goal on Lisinopril 20 QD. Cont med.  BP Readings from Last 3 Encounters:  03/17/15 136/78  03/02/15 137/97  02/08/15 150/94

## 2015-03-17 NOTE — Progress Notes (Signed)
   Subjective:    [name], male    DOB: 07-29-1961, 54 y.o.   MRN: [id number]  HPI  Please see the A&P for the status of the pt's chronic medical problems. I am seeing [name] today for the first time to est care with me. Has seen other providers at [facility].   Review of Systems  Constitutional: Positive for unexpected weight change.  Gastrointestinal: Negative for abdominal pain and blood in stool.  Musculoskeletal: Positive for back pain.       Objective:   Physical Exam  Constitutional: He is oriented to person, place, and time. He appears well-developed and well-nourished. No distress.  HENT:  Head: Normocephalic and atraumatic.  Right Ear: External ear normal.  Left Ear: External ear normal.  Nose: Nose normal.  Eyes: Conjunctivae and EOM are normal.  Pulmonary/Chest: Effort normal.  Neurological: He is alert and oriented to person, place, and time.  Skin: Skin is warm and dry. He is not diaphoretic.  Psychiatric: He has a normal mood and affect. His behavior is normal. Judgment and thought content normal.          Assessment & Plan:

## 2015-03-17 NOTE — Assessment & Plan Note (Addendum)
He is on metformin 1000 BID. His A1C decreased from 7.4 to 7.0. He checks his CBG occ at home. UTD on all DM measures.

## 2015-03-18 ENCOUNTER — Encounter: Payer: Self-pay | Admitting: Internal Medicine

## 2015-03-18 DIAGNOSIS — G894 Chronic pain syndrome: Secondary | ICD-10-CM | POA: Insufficient documentation

## 2015-03-18 DIAGNOSIS — B1920 Unspecified viral hepatitis C without hepatic coma: Secondary | ICD-10-CM

## 2015-03-18 HISTORY — DX: Unspecified viral hepatitis C without hepatic coma: B19.20

## 2015-03-18 NOTE — Assessment & Plan Note (Signed)
He is on Zocor 40 QD. Last LDL was 109 in 10/2014. Goal would be less than 100. I am not going to make any changes as he might be a candidate for Hep C antivirals and more potent statins (crestor and lipitor) as not indicated while on Harvoni. Simva is OK combined with harvoni but I do not want to increase the dose due to higher likelihood of SE on 80 of Simva.

## 2015-03-18 NOTE — Assessment & Plan Note (Deleted)
All of his pain issues are tx be Dr Naaman Plummer although [facility] is currently Rxing hydrocodone.

## 2015-03-18 NOTE — Assessment & Plan Note (Signed)
He sees Dr Naaman Plummer but he is not Rxing opioids. He brought all of his pill bottles today and really didn't know which was which and which treated what and had two bottles of gaba and multiple empty bottles.  Empty bottles of prednisone, hydrocodone, and IBU. I set aside to remove labels and throw away but he picked up and threw in trash. Bottles of baclofen and robaxin. I explained they were similar and not to take both and to discuss with Naaman Plummer.  Two bottles of gaba. One bottle of celebrex.   I encouraged him to talk to Naaman Plummer regarding which muscle relaxer he was to use.   As for his opioid, his last UDS ordered by Naaman Plummer was inappropriate - had THC and no opioid (but takes PRN). Naaman Plummer had been considering Rxing opioids but I doubt he will now. I explained that for now I would cont Rxing the hydrocodone #30 to last about 45 days but that he had to stop the THC. I am not getting contract (first appt today and consumed with chronic Hep C) and I may not consider cont them. UDS will be challenging as takes only about QOD.   I will fill it btw now and not appt.

## 2015-03-18 NOTE — Assessment & Plan Note (Signed)
He is restarting the Hep B vaccination series today as a non responded from 2013 series.  Told to stop THC (might affect ability to get opioids) and to stop ETOH (also Hep C).

## 2015-03-18 NOTE — Assessment & Plan Note (Signed)
All of his pain issues are tx be Dr Naaman Plummer although [facility] is currently Rxing hydrocodone.

## 2015-03-22 ENCOUNTER — Encounter
Payer: Commercial Managed Care - HMO | Attending: Physical Medicine & Rehabilitation | Admitting: Physical Medicine & Rehabilitation

## 2015-03-22 ENCOUNTER — Encounter: Payer: Self-pay | Admitting: Physical Medicine & Rehabilitation

## 2015-03-22 VITALS — BP 149/85 | HR 88 | Resp 14

## 2015-03-22 DIAGNOSIS — E1144 Type 2 diabetes mellitus with diabetic amyotrophy: Secondary | ICD-10-CM | POA: Diagnosis not present

## 2015-03-22 DIAGNOSIS — E1140 Type 2 diabetes mellitus with diabetic neuropathy, unspecified: Secondary | ICD-10-CM | POA: Diagnosis not present

## 2015-03-22 DIAGNOSIS — M79606 Pain in leg, unspecified: Secondary | ICD-10-CM | POA: Diagnosis not present

## 2015-03-22 DIAGNOSIS — M25559 Pain in unspecified hip: Secondary | ICD-10-CM | POA: Diagnosis not present

## 2015-03-22 DIAGNOSIS — E1142 Type 2 diabetes mellitus with diabetic polyneuropathy: Secondary | ICD-10-CM

## 2015-03-22 DIAGNOSIS — M545 Low back pain: Secondary | ICD-10-CM | POA: Insufficient documentation

## 2015-03-22 DIAGNOSIS — G894 Chronic pain syndrome: Secondary | ICD-10-CM | POA: Diagnosis not present

## 2015-03-22 DIAGNOSIS — G8929 Other chronic pain: Secondary | ICD-10-CM | POA: Insufficient documentation

## 2015-03-22 NOTE — Patient Instructions (Signed)
CONTINUE WORKING ON GOOD FORM AND TECHNIQUE.  DO YOUR STRETCHES DAILY.  PLEASE CALL ME WITH ANY PROBLEMS OR QUESTIONS (#[phone]).

## 2015-03-22 NOTE — Progress Notes (Signed)
Subjective:    [name], male    DOB: [birth year], 54 y.o.   MRN: [id number]  HPI   Donald Palmer is here in follow up of his low back pain. He has had good results with the celebrex and robaxin. He doesn't feel the same spasm and pain he had in the right low back and pain he had before. He still has some "soreness" which he feels is quite tolerable.   Pain Inventory Average Pain 6 Pain Right Now 6 My pain is tingling and aching  In the last 24 hours, has pain interfered with the following? General activity 6 Relation with others 7 Enjoyment of life 7 What TIME of day is your pain at its worst? night Sleep (in general) Fair  Pain is worse with: bending, sitting and some activites Pain improves with: heat/ice, therapy/exercise, medication and injections Relief from Meds: 6  Mobility walk without assistance walk with assistance use a cane how many minutes can you walk? 30 ability to climb steps?  yes do you drive?  yes Do you have any goals in this area?  yes  Function disabled: date disabled . Do you have any goals in this area?  yes  Neuro/Psych weakness numbness spasms depression  Prior Studies Any changes since last visit?  no  Physicians involved in your care Any changes since last visit?  no   Family History  Problem Relation Age of Onset  . Diabetes Mother   . Hypertension Mother   . Hyperlipidemia Mother   . Diabetes Brother   . Hypertension Brother   . Heart attack Neg Hx   . Sudden death Neg Hx    History   Social History  . Marital Status: Single    Spouse Name: N/A  . Number of Children: N/A  . Years of Education: N/A   Social History Main Topics  . Smoking status: Current Some Day Smoker -- 0.10 packs/day    Types: Cigarettes, Cigars    Last Attempt to Quit: 01/05/2012  . Smokeless tobacco: Never Used     Comment: 2 cigs/week  . Alcohol Use: 3.0 oz/week    5 Cans of beer per week  . Drug Use: No  . Sexual Activity: Yes     Birth Control/ Protection: None   Other Topics Concern  . None   Social History Narrative   Prior incarceration. Tattoos, inc ones obtained in prison.    On disability since 09/2013   History reviewed. No pertinent past surgical history. Past Medical History  Diagnosis Date  . Diabetes mellitus   . Neuromuscular disorder   . Pancreatitis 01/09/2012  . Hypertension goal BP (blood pressure) < 140/80   . Hepatitis C     [id number]   BP 149/85 mmHg  Pulse 88  Resp 14  SpO2 98%  Opioid Risk Score:   Fall Risk Score:  `1  Depression screen PHQ 2/9  Depression screen [facility] 2/9 03/17/2015 02/08/2015 12/22/2014 10/28/2014 05/28/2014 04/19/2014 01/18/2014  Decreased Interest 0 1 0 0 0 0 0  Down, Depressed, Hopeless 1 1 - 0 0 0 0  PHQ - 2 Score 1 2 0 0 0 0 0  Altered sleeping - 3 - - - - -  Tired, decreased energy - 3 - - - - -  Change in appetite - 3 - - - - -  Feeling bad or failure about yourself  - 0 - - - - -  Trouble concentrating -  0 - - - - -  Moving slowly or fidgety/restless - 3 - - - - -  Suicidal thoughts - 0 - - - - -  PHQ-9 Score - 14 - - - - -     Review of Systems  Constitutional:       Weight loss  Respiratory: Positive for wheezing.   Endocrine:       High blood sugar Low blood sugar   Neurological: Positive for weakness and numbness.       Spasms  Psychiatric/Behavioral: Positive for decreased concentration.  All other systems reviewed and are negative.      Objective:   Physical Exam  General: Alert and oriented x 3, No apparent distress. Cooperative with exam but needed to be re-directed numerous times to stay on task.  HEENT: Head is normocephalic, atraumatic, PERRLA, EOMI, sclera anicteric, oral mucosa pink and moist, dentition intact, ext ear canals clear,  Neck: Supple without JVD or lymphadenopathy  Heart: Reg rate and rhythm. No murmurs rubs or gallops  Chest: CTA bilaterally without wheezes, rales, or rhonchi; no distress  Abdomen: Soft,  non-tender, non-distended, bowel sounds positive.  Extremities: No clubbing, cyanosis, or edema. Pulses are 2+  Skin: Clean and intact without signs of breakdown  Neuro: Pt is cognitively appropriate with normal insight, memory, and awareness. Cranial nerves 2-12 are intact. Sensory exam is decreased in a stocking glove distribution in both legs below the knees as well as the finger tips. He has some sensory loss along the medial right knee.. Reflexes are 2+ in all 4's. Fine motor coordination is intact. No tremors. Motor function is grossly 5/5 except for pain inhibition in the right leg..  Musculoskeletal: He is limited in lumbar flexion as well as side bending. Facet maneuvers were equivocal on the right. Extension provided only mild pain. SLR was equiovcal, FABER was negative. Cross legged maneuver neg, compression test negative. Prone lying did not increase pain. The most pain he had was with palpation of the right PSIS. With palpation there was some radiation into the right buttock. Right knee is non-tender with AROM or PROM. Meniscal maneuvers were negative. He had mild tenderness with palpation of medial knee on right. Gait appeared generally normal. No gross spinal or pelvic symmetry abnl.  Psych: Pt's affect is appropriate. Pt is cooperative, a little high strung.    Assessment & Plan:   1. Chronic low back pain. 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 Relatively normal lumbar MRI from 2013  4. Diabetic polyneuropathy   Plan:  1. Celebrex trial, 200mg  qd  2. Robaxin prn for muscle spasm  3. Reviewed the importance of stress management,mood and its impact on pain.   4. His UDS was positive for THC---non-narcotic mgt only here.  5. Continue gabapentin, 900mg  3x daily for referred, neuro pain  6. Follow up with me in about 4 months. 15 minutes of face to face patient care time were spent during this visit. All  questions were encouraged and answered.

## 2015-03-28 ENCOUNTER — Encounter: Payer: Self-pay | Admitting: Internal Medicine

## 2015-03-28 ENCOUNTER — Ambulatory Visit (INDEPENDENT_AMBULATORY_CARE_PROVIDER_SITE_OTHER): Payer: Commercial Managed Care - HMO | Admitting: Internal Medicine

## 2015-03-28 VITALS — BP 130/86 | HR 71 | Temp 97.2°F | Wt 167.0 lb

## 2015-03-28 DIAGNOSIS — B182 Chronic viral hepatitis C: Secondary | ICD-10-CM | POA: Diagnosis not present

## 2015-03-28 MED ORDER — LEDIPASVIR-SOFOSBUVIR 90-400 MG PO TABS: 1.0000 | ORAL_TABLET | Freq: Every day | ORAL | Status: DC

## 2015-03-28 NOTE — Patient Instructions (Addendum)
Date 03/28/2015  Dear Mr. Howell, As discussed in the [facility], your hepatitis C therapy will include the following medications:          Harvoni 90mg /400mg  tablet:           Take 1 tablet by mouth once daily   Please note that ALL MEDICATIONS WILL START ON THE SAME DATE for a total of 12 weeks. ---------------------------------------------------------------- Your HCV Treatment Start Date: TBA   Your HCV genotype:  1b    Liver Fibrosis: TBD    ---------------------------------------------------------------- YOUR PHARMACY CONTACT:   [place] [facility] [place] Phone: [phone] Hours: Monday to Friday 7:30 am to 6:00 pm   Please always contact your pharmacy at least 3-4 business days before you run out of medications to ensure your next month's medication is ready or 1 week prior to running out if you receive it by mail.  Remember, each prescription is for 28 days. ---------------------------------------------------------------- GENERAL NOTES REGARDING YOUR HEPATITIS C MEDICATION:  SOFOSBUVIR/LEDIPASVIR (HARVONI): - Harvoni tablet is taken daily with OR without food. - The tablets are orange. - The tablets should be stored at room temperature.  - Acid reducing agents such as H2 blockers (ie. Pepcid (famotidine), Zantac (ranitidine), Tagamet (cimetidine), Axid (nizatidine) and proton pump inhibitors (ie. Prilosec (omeprazole), Protonix (pantoprazole), Nexium (esomeprazole), or Aciphex (rabeprazole)) can decrease effectiveness of Harvoni. Do not take until you have discussed with a health care provider.    -Antacids that contain magnesium and/or aluminum hydroxide (ie. Milk of Magensia, Rolaids, Gaviscon, Maalox, Mylanta, an dArthritis Pain Formula)can reduce absorption of Harvoni, so take them at least 4 hours before or after Harvoni.  -Calcium carbonate (calcium supplements or antacids such as Tums, Caltrate,  Os-Cal)needs to be taken at least 4 hours hours before or after Harvoni.  -St. John's wort or any products that contain St. John's wort like some herbal supplements  Please inform the office prior to starting any of these medications.  - The common side effects with Harvoni:      1. Fatigue      2. Headache      3. Nausea      4. Diarrhea      5. Insomnia   Support Path is a suite of resources designed to help patients start with HARVONI and move toward treatment completion [facility] helps patients access therapy and get off to an efficient start  Benefits investigation and prior authorization support Co-pay and other financial assistance A specialty pharmacy finder CO-PAY COUPON The [facility] co-pay coupon may help eligible patients lower their out-of-pocket costs. With a co-pay coupon, most eligible patients may pay no more than $5 per co-pay (restrictions apply) www.harvoni.com call [phone] Not valid for patients enrolled in government healthcare prescription drug programs, such as Medicare Part D and Medicaid. Patients in the coverage gap known as the "donut hole" also are not eligible The HARVONI co-pay coupon program will cover the out-of-pocket costs for HARVONI prescriptions up to a maximum of 25% of the catalog price of a 12-week regimen of HARVONI  Please note that this only lists the most common side effects and is NOT a comprehensive list of the potential side effects of these medications. For more information, please review the drug information sheets that come with your medication package from the pharmacy.  ---------------------------------------------------------------- GENERAL HELPFUL HINTS ON HCV THERAPY: 1. No alcohol. 2. Protect against sun-sensitivity/sunburns (wear sunglasses, hat, long sleeves, pants  and sunscreen). 3. Stay well-hydrated/well-moisturized. 4. Notify the [facility] Clinic of any changes in your other over-the-counter/herbal or  prescription medications. 5. If you miss a dose of your medication, take the missed dose as soon as you remember. Return to your regular time/dose schedule the next day.  6.  Do not stop taking your medications without first talking with your healthcare provider. 7.  You may take Tylenol (acetaminophen), as long as the dose is less than 2000 mg (OR no more than 4 tablets of the Tylenol Extra Strengths 500mg  tablet) in 24 hours. 8.  You will need to obtain routine labs and/or office visits at [facility] at weeks 2, 4, 8,  and 12 as well as 12 and 24 weeks after completion of treatment.   Scharlene Gloss, [place] for [address], [place]  [phone]

## 2015-03-28 NOTE — Progress Notes (Signed)
+Donald Palmer is a 54 y.o. male who presents for initial evaluation and management of a positive Hepatitis C antibody test.  Patient tested positive several years ago. Hepatitis C risk factors present are: tattoos (details: in jail). Patient denies history of blood transfusion, intranasal drug use, IV drug abuse, multiple sexual partners, renal dialysis, sexual contact with person with liver disease. Patient has had other studies performed. Results: hepatitis C RNA by PCR, result: positive. Patient has not had prior treatment for Hepatitis C. Patient does not have a past history of liver disease. Patient does not have a family history of liver disease.   HPI: He is unsure of how he could have become infected.  He days he is scheduled for EGD to work up his dark stools. Gets elastography tomorrow at [facility].    Patient does not have documented immunity to Hepatitis A. Patient does not have documented immunity to Hepatitis B.     Review of Systems A comprehensive review of systems was negative.   Past Medical History  Diagnosis Date  . Diabetes mellitus   . Neuromuscular disorder   . Pancreatitis 01/09/2012  . Hypertension goal BP (blood pressure) < 140/80   . Hepatitis C     [id number]    Prior to Admission medications   Medication Sig Start Date End Date Taking? Authorizing Provider  baclofen (LIORESAL) 10 MG tablet Take 1 tablet (10 mg total) by mouth every 8 (eight) hours as needed for muscle spasms. 02/08/15  Yes Donald T Swartz, MD  Blood Glucose Monitoring Suppl (ACCU-CHEK AVIVA PLUS) W/DEVICE KIT 1 each by Does not apply route 2 (two) times daily. To check blood sugar twice daily. diag code 250.60. Non insulin dependent 10/19/13  Yes Donald Niu, MD  celecoxib (CELEBREX) 200 MG capsule Take 1 capsule (200 mg total) by mouth daily. 02/08/15  Yes Donald T Swartz, MD  fluticasone (FLONASE) 50 MCG/ACT nasal spray Place 2 sprays into both nostrils daily. 10/22/14  Yes Donald J  Moding, MD  gabapentin (NEURONTIN) 300 MG capsule Take 3 capsules (900 mg total) by mouth 3 (three) times daily. 12/22/14  Yes Donald J Moding, MD  glucose blood (ACCU-CHEK AVIVA PLUS) test strip To use twice daily to check blood sugar. diag code 250.60. Non insulin dependent 10/22/14  Yes Donald J Moding, MD  HYDROcodone-acetaminophen (NORCO) 7.5-325 MG per tablet Take 1 tablet by mouth every 6 (six) hours as needed for moderate pain. 03/17/15  Yes Donald A Butcher, MD  lisinopril (PRINIVIL,ZESTRIL) 20 MG tablet Take 1 tablet (20 mg total) by mouth daily. 12/22/14  Yes Donald J Moding, MD  metFORMIN (GLUCOPHAGE) 1000 MG tablet Take 0.5 tablets in the morning and 1 tablet at night. Patient taking differently: Take 1,000 mg by mouth 2 (two) times daily with a meal.  10/28/14  Yes Donald J Qureshi, MD  simvastatin (ZOCOR) 40 MG tablet Take 1 tablet (40 mg total) by mouth every evening. 12/22/14 12/22/15 Yes Donald J Moding, MD  Ledipasvir-Sofosbuvir (HARVONI) 90-400 MG TABS Take 1 tablet by mouth daily. 03/28/15   Donald W Comer, MD    No Known Allergies  History  Substance Use Topics  . Smoking status: Current Some Day Smoker -- 0.10 packs/day    Types: Cigarettes, Cigars    Last Attempt to Quit: 01/05/2012  . Smokeless tobacco: Never Used     Comment: 2 cigs/week  . Alcohol Use: 3.0 oz/week    5 Cans of beer per week      Family History  Problem Relation Age of Onset  . Diabetes Mother   . Hypertension Mother   . Hyperlipidemia Mother   . Diabetes Brother   . Hypertension Brother   . Heart attack Neg Hx   . Sudden death Neg Hx       Objective:   Filed Vitals:   03/28/15 1458  BP: 130/86  Pulse: 71  Temp: 97.2 F (36.2 C)   in no apparent distress and alert HEENT: anicteric Cor RRR and No murmurs clear Bowel sounds are normal, liver is not enlarged, spleen is not enlarged peripheral pulses normal, no pedal edema, no clubbing or cyanosis negative for - jaundice, spider  hemangioma, telangiectasia, palmar erythema, ecchymosis and atrophy  Laboratory Genotype:  Lab Results  Component Value Date   HCVGENOTYPE 1b 02/11/2015   HCV viral load:  Lab Results  Component Value Date   HCVQUANT [id number]* 02/08/2015   Lab Results  Component Value Date   WBC 6.9 03/02/2015   HGB 14.2 03/02/2015   HCT 42.5 03/02/2015   MCV 72.8* 03/02/2015   PLT 226 03/02/2015    Lab Results  Component Value Date   CREATININE 0.82 12/14/2014   BUN 9 12/14/2014   NA 137 12/14/2014   K 4.4 12/14/2014   CL 104 12/14/2014   CO2 24 12/14/2014    Lab Results  Component Value Date   ALT 39 12/14/2014   AST 41* 12/14/2014   ALKPHOS 95 12/14/2014   BILITOT 0.9 12/14/2014   INR 1.0 02/08/2015      Assessment: Chronic Hepatitis C genotype 1b  Plan: 1) Patient counseled extensively on limiting acetaminophen to no more than 2 grams daily, avoidance of alcohol. 2) Transmission discussed with patient including sexual transmission, sharing razors and toothbrush.   3) Will need referral to gastroenterology if concern for cirrhosis - apparently is getting EGD unrelated to hepatitis C.  Will see if F3 or 4 and needs varices screening.   4) Will need referral for substance abuse counseling: No. 5) Will prescribe Harvoni for 12 weeks, sent today 6) Hepatitis A vaccine No. received from PCP 7) Hepatitis B vaccine No. 8) Pneumovax vaccine if concern for cirrhosis 9) will follow up after starting medication or in 1 year if denied 10) he will need to hold or reduce his Prilosec to 20 mg daily if/when he is on Harvoni.

## 2015-03-29 ENCOUNTER — Ambulatory Visit (HOSPITAL_COMMUNITY)
Admission: RE | Admit: 2015-03-29 | Discharge: 2015-03-29 | Disposition: A | Discharge status: Home / Self Care | Payer: Commercial Managed Care - HMO | Source: Ambulatory Visit | Attending: Internal Medicine | Admitting: Internal Medicine

## 2015-03-29 DIAGNOSIS — Z113 Encounter for screening for infections with a predominantly sexual mode of transmission: Secondary | ICD-10-CM | POA: Diagnosis not present

## 2015-03-29 DIAGNOSIS — I10 Essential (primary) hypertension: Secondary | ICD-10-CM | POA: Diagnosis not present

## 2015-03-29 DIAGNOSIS — E119 Type 2 diabetes mellitus without complications: Secondary | ICD-10-CM | POA: Diagnosis not present

## 2015-03-29 DIAGNOSIS — K8020 Calculus of gallbladder without cholecystitis without obstruction: Secondary | ICD-10-CM | POA: Diagnosis not present

## 2015-03-29 DIAGNOSIS — B182 Chronic viral hepatitis C: Secondary | ICD-10-CM | POA: Diagnosis not present

## 2015-03-30 ENCOUNTER — Encounter: Payer: Self-pay | Admitting: Internal Medicine

## 2015-03-30 ENCOUNTER — Ambulatory Visit (INDEPENDENT_AMBULATORY_CARE_PROVIDER_SITE_OTHER): Payer: Commercial Managed Care - HMO | Admitting: Internal Medicine

## 2015-03-30 VITALS — BP 139/99 | HR 80 | Temp 98.1°F | Ht 69.0 in | Wt 172.2 lb

## 2015-03-30 DIAGNOSIS — L84 Corns and callosities: Secondary | ICD-10-CM | POA: Diagnosis not present

## 2015-03-30 DIAGNOSIS — E119 Type 2 diabetes mellitus without complications: Secondary | ICD-10-CM | POA: Diagnosis not present

## 2015-03-30 DIAGNOSIS — E1142 Type 2 diabetes mellitus with diabetic polyneuropathy: Secondary | ICD-10-CM

## 2015-03-30 DIAGNOSIS — I700 Atherosclerosis of aorta: Secondary | ICD-10-CM | POA: Insufficient documentation

## 2015-03-30 LAB — GLUCOSE, CAPILLARY: Glucose-Capillary: 124 mg/dL — ABNORMAL HIGH (ref 70–99)

## 2015-03-30 NOTE — Progress Notes (Signed)
Medicine attending: I personally interviewed and briefly examined this patient and reviewed pertinent clinical data  together with resident physician Dr.Erik Heber Dunlap and I concur with his evaluation and management plan. Tender nodule lateral aspect left foot; no ulceration. Neurofibroma? Diabetes related?  Will refer to Podiatrist for further evaluation.

## 2015-03-30 NOTE — Assessment & Plan Note (Signed)
-   Exam consistent with calluses of his heel.  This may be secondary to poor footwear. His diabetes overall is well controlled.   -We will refer him for eval and treatment by podiatry, he may need paperwork filled out for diabetic shoes to prevent the formation of ulcers.

## 2015-03-30 NOTE — Progress Notes (Signed)
[facility] Subjective:   [name] male   DOB: [birth year] 54 y.o.   MRN: [id number]  HPI: [name] is a 54 y.o. male with a PMH detailed below who presents for evaluation of his feet.  He is a well controlled diabetic with last A1c of 7.0.  He reports that he was cleaning his feet yesterday and noticed some black spots and nodules on his heels, he notes that one of the spots was a little painful.  He did not previously notice any of this before yesterday and was never bothered.  He has no history of previous foot complications but is very concerned about his feet as he has had multiple family members who have had amputations due to diabetes.    Past Medical History  Diagnosis Date  . Diabetes mellitus   . Neuromuscular disorder   . Pancreatitis 01/09/2012  . Hypertension goal BP (blood pressure) < 140/80   . Hepatitis C     [id number]   Current Outpatient Prescriptions  Medication Sig Dispense Refill  . baclofen (LIORESAL) 10 MG tablet Take 1 tablet (10 mg total) by mouth every 8 (eight) hours as needed for muscle spasms. 60 each 3  . Blood Glucose Monitoring Suppl (ACCU-CHEK AVIVA PLUS) W/DEVICE KIT 1 each by Does not apply route 2 (two) times daily. To check blood sugar twice daily. diag code 250.60. Non insulin dependent 1 kit 0  . celecoxib (CELEBREX) 200 MG capsule Take 1 capsule (200 mg total) by mouth daily. 30 capsule 3  . fluticasone (FLONASE) 50 MCG/ACT nasal spray Place 2 sprays into both nostrils daily. 16 g 2  . gabapentin (NEURONTIN) 300 MG capsule Take 3 capsules (900 mg total) by mouth 3 (three) times daily. 270 capsule 11  . glucose blood (ACCU-CHEK AVIVA PLUS) test strip To use twice daily to check blood sugar. diag code 250.60. Non insulin dependent 100 each 12  . HYDROcodone-acetaminophen (NORCO) 7.5-325 MG per tablet Take 1 tablet by mouth every 6 (six) hours as needed for moderate pain. 30 tablet 0  . Ledipasvir-Sofosbuvir  (HARVONI) 90-400 MG TABS Take 1 tablet by mouth daily. 28 tablet 2  . lisinopril (PRINIVIL,ZESTRIL) 20 MG tablet Take 1 tablet (20 mg total) by mouth daily. 30 tablet 11  . metFORMIN (GLUCOPHAGE) 1000 MG tablet Take 0.5 tablets in the morning and 1 tablet at night. (Patient taking differently: Take 1,000 mg by mouth 2 (two) times daily with a meal. ) 180 tablet 3  . simvastatin (ZOCOR) 40 MG tablet Take 1 tablet (40 mg total) by mouth every evening. 30 tablet 11   No current facility-administered medications for this visit.   Family History  Problem Relation Age of Onset  . Diabetes Mother   . Hypertension Mother   . Hyperlipidemia Mother   . Diabetes Brother   . Hypertension Brother   . Heart attack Neg Hx   . Sudden death Neg Hx    History   Social History  . Marital Status: Single    Spouse Name: N/A  . Number of Children: N/A  . Years of Education: N/A   Social History Main Topics  . Smoking status: Current Some Day Smoker -- 0.10 packs/day    Types: Cigarettes, Cigars    Last Attempt to Quit: 01/05/2012  . Smokeless tobacco: Never Used     Comment: 2 cigs/week  . Alcohol Use: 3.0 oz/week    5 Cans of beer per week  .  Drug Use: No  . Sexual Activity: Yes    Birth Control/ Protection: None   Other Topics Concern  . None   Social History Narrative   Prior incarceration. Tattoos, inc ones obtained in prison.    On disability since 09/2013   Review of Systems: Review of Systems  Constitutional: Negative for fever, chills and weight loss.  Eyes: Negative for blurred vision.  Genitourinary: Negative for frequency.  Skin: Negative for rash.  Endo/Heme/Allergies: Negative for polydipsia.     Objective:  Physical Exam: Filed Vitals:   03/30/15 0836  BP: 139/99  Pulse: 80  Temp: 98.1 F (36.7 C)  TempSrc: Oral  Height: _0  (1.753 m)  Weight: 172 lb 3.2 oz (78.109 kg)  SpO2: 100%  Physical Exam  Constitutional: He is well-developed, well-nourished, and in  no distress.  Cardiovascular: Normal rate and regular rhythm.   2+ dp and pt pulses bilaterally, hair growth to toe  Musculoskeletal: He exhibits no edema.  Skin:  He has about 3 small 1-64m lightly darken spots on the heel of his right foot, the skin appears slightly thickened.  On the left foot he has 1 333mnodule on the lateral aspect of his heel that is tender to palpation, this is not erythematous or warm.  He does not have any skin breakdown bilaterally.  Nursing note and vitals reviewed.   Assessment & Plan:  Case discussed and patient seen with Dr. GrBeryle BeamsPre-ulcerative calluses - Exam consistent with calluses of his heel.  This may be secondary to poor footwear. His diabetes overall is well controlled.   -We will refer him for eval and treatment by podiatry, he may need paperwork filled out for diabetic shoes to prevent the formation of ulcers.     Medications Ordered No orders of the defined types were placed in this encounter.   Other Orders Orders Placed This Encounter  Procedures  . Glucose, capillary  . Ambulatory referral to Podiatry    Referral Priority:  Routine    Referral Type:  Consultation    Referral Reason:  Specialty Services Required    Requested Specialty:  Podiatry    Number of Visits Requested:  1

## 2015-03-30 NOTE — Patient Instructions (Signed)
General Instructions: I am placing a referral for you to see the foot specialist.  Please bring your medicines with you each time you come to clinic.  Medicines may include prescription medications, over-the-counter medications, herbal remedies, eye drops, vitamins, or other pills.   Progress Toward Treatment Goals:  Treatment Goal 03/30/2015  Hemoglobin A1C at goal  Blood pressure at goal  Stop smoking -    Self Care Goals & Plans:  Self Care Goal 03/30/2015  Manage my medications take my medicines as prescribed; bring my medications to every visit  Monitor my health -  Eat healthy foods -  Be physically active -  Stop smoking -    Home Blood Glucose Monitoring 03/30/2015  Check my blood sugar once a day  When to check my blood sugar before breakfast     Care Management & Community Referrals:  Referral 06/22/2013  Referrals made for care management support none needed  Referrals made to community resources none

## 2015-04-11 ENCOUNTER — Telehealth: Payer: Self-pay | Admitting: *Deleted

## 2015-04-11 NOTE — Telephone Encounter (Signed)
Pt says we called him about rescheduling his appt. I reviewed his appt schedule and saw that Dr. Letta Pate was off the week that the pt was scheduled. I concluded that they were lightening up Dr. Charm Barges schedule. I went ahead and rescheduled him a week earlier

## 2015-04-20 ENCOUNTER — Encounter: Payer: Self-pay | Admitting: Podiatrist

## 2015-04-20 ENCOUNTER — Ambulatory Visit (INDEPENDENT_AMBULATORY_CARE_PROVIDER_SITE_OTHER): Payer: Commercial Managed Care - HMO | Admitting: Podiatrist

## 2015-04-20 VITALS — BP 146/94 | HR 84 | Resp 12

## 2015-04-20 DIAGNOSIS — L309 Dermatitis, unspecified: Secondary | ICD-10-CM

## 2015-04-20 DIAGNOSIS — E119 Type 2 diabetes mellitus without complications: Secondary | ICD-10-CM

## 2015-04-20 MED ORDER — CLOTRIMAZOLE-BETAMETHASONE 1-0.05 % EX CREA: 1.0000 "application " | TOPICAL_CREAM | Freq: Two times a day (BID) | CUTANEOUS | Status: DC

## 2015-04-20 NOTE — Patient Instructions (Signed)
Diabetes and Foot Care Diabetes may cause you to have problems because of poor blood supply (circulation) to your feet and legs. This may cause the skin on your feet to become thinner, break easier, and heal more slowly. Your skin may become dry, and the skin may peel and crack. You may also have nerve damage in your legs and feet causing decreased feeling in them. You may not notice minor injuries to your feet that could lead to infections or more serious problems. Taking care of your feet is one of the most important things you can do for yourself.  HOME CARE INSTRUCTIONS  Wear shoes at all times, even in the house. Do not go barefoot. Bare feet are easily injured.  Check your feet daily for blisters, cuts, and redness. If you cannot see the bottom of your feet, use a mirror or ask someone for help.  Wash your feet with warm water (do not use hot water) and mild soap. Then pat your feet and the areas between your toes until they are completely dry. Do not soak your feet as this can dry your skin.  Apply a moisturizing lotion or petroleum jelly (that does not contain alcohol and is unscented) to the skin on your feet and to dry, brittle toenails. Do not apply lotion between your toes.  Trim your toenails straight across. Do not dig under them or around the cuticle. File the edges of your nails with an emery board or nail file.  Do not cut corns or calluses or try to remove them with medicine.  Wear clean socks or stockings every day. Make sure they are not too tight. Do not wear knee-high stockings since they may decrease blood flow to your legs.  Wear shoes that fit properly and have enough cushioning. To break in new shoes, wear them for just a few hours a day. This prevents you from injuring your feet. Always look in your shoes before you put them on to be sure there are no objects inside.  Do not cross your legs. This may decrease the blood flow to your feet.  If you find a minor scrape,  cut, or break in the skin on your feet, keep it and the skin around it clean and dry. These areas may be cleansed with mild soap and water. Do not cleanse the area with peroxide, alcohol, or iodine.  When you remove an adhesive bandage, be sure not to damage the skin around it.  If you have a wound, look at it several times a day to make sure it is healing.  Do not use heating pads or hot water bottles. They may burn your skin. If you have lost feeling in your feet or legs, you may not know it is happening until it is too late.  Make sure your health care provider performs a complete foot exam at least annually or more often if you have foot problems. Report any cuts, sores, or bruises to your health care provider immediately. SEEK MEDICAL CARE IF:   You have an injury that is not healing.  You have cuts or breaks in the skin.  You have an ingrown nail.  You notice redness on your legs or feet.  You feel burning or tingling in your legs or feet.  You have pain or cramps in your legs and feet.  Your legs or feet are numb.  Your feet always feel cold. SEEK IMMEDIATE MEDICAL CARE IF:   There is increasing redness,   swelling, or pain in or around a wound.  There is a red line that goes up your leg.  Pus is coming from a wound.  You develop a fever or as directed by your health care provider.  You notice a bad smell coming from an ulcer or wound. Document Released: 11/30/2000 Document Revised: 08/05/2013 Document Reviewed: 05/12/2013 ExitCare Patient Information 2015 [employer]. This information is not intended to replace advice given to you by your health care provider. Make sure you discuss any questions you have with your health care provider.  

## 2015-04-20 NOTE — Progress Notes (Signed)
   Subjective:    [name], male    DOB: 09-Jul-1961, 54 y.o.   MRN: [id number]  HPI Patient presents with "pain and black spots on heel of B/L feet with left foot the worse and it is sore to the touch"  First notice black spots on bottom of feet a month ago. It feels hard and sore and painful. Has been soaking in warm water but it has not helped.   Review of Systems  Constitutional: Positive for activity change and unexpected weight change.  Eyes: Positive for pain.  Cardiovascular: Positive for leg swelling.       Calf pain when walking  Musculoskeletal: Positive for back pain and gait problem.       Joint pain,muscle pain  Skin: Positive for color change and rash.       Change in nails  Neurological: Positive for tremors, weakness, numbness and headaches.  Psychiatric/Behavioral: Positive for behavioral problems.       Objective:   Physical Exam Vascular status reveals palpable pedal pulses at 2/4 DP and 1/4 PT lateral. Capillary refill time is less than 3 seconds bilateral. Digital hair growth is noted bilateral. Neurological sensation is intact via Semmes Weinstein monofilament at 5/5 sites bilateral. Light touch and vibratory sensation intact bilateral. Musculoskeletal examination reveals normal arch height. No biomechanical abnormalities are noted. Small hyperkeratotic lesion noted the plantar lateral aspect of the left heel. Slight darkish discoloration is noted just beneath the skin consistent with the skin callus. He also has peeling to the bilateral plantar surfaces of feet in a moccasin-like distribution consistent with tinea pedis      Assessment & Plan:  Diabetes with no complications, tinea pedis, callus 1  Plan: Debridement of calluses accomplished without complication. Recommended Lotrisone cream and this will be called into his pharmacy. If any concerns resident future he will call. He is aware of diabetes and its complications as he lost a brother and a  sister to competition of diabetes resulting in amputations bilaterally.

## 2015-04-26 ENCOUNTER — Ambulatory Visit: Payer: Commercial Managed Care - HMO | Admitting: *Deleted

## 2015-04-26 ENCOUNTER — Encounter: Payer: Self-pay | Admitting: Internal Medicine

## 2015-04-26 DIAGNOSIS — Z0000 Encounter for general adult medical examination without abnormal findings: Secondary | ICD-10-CM

## 2015-04-26 MED ORDER — HEPATITIS B VAC RECOMBINANT 5 MCG/0.5ML IJ SUSP
1.0000 mL | Freq: Once | INTRAMUSCULAR | Status: AC
  Administered 2015-04-26: 5 ug via INTRAMUSCULAR

## 2015-05-24 ENCOUNTER — Other Ambulatory Visit: Payer: Self-pay | Admitting: *Deleted

## 2015-05-24 DIAGNOSIS — G8929 Other chronic pain: Secondary | ICD-10-CM

## 2015-05-24 DIAGNOSIS — J0101 Acute recurrent maxillary sinusitis: Secondary | ICD-10-CM

## 2015-05-24 DIAGNOSIS — M545 Low back pain: Secondary | ICD-10-CM

## 2015-05-24 MED ORDER — HYDROCODONE-ACETAMINOPHEN 7.5-325 MG PO TABS: 1.0000 | ORAL_TABLET | Freq: Four times a day (QID) | ORAL | Status: DC | PRN

## 2015-05-24 MED ORDER — FLUTICASONE PROPIONATE 50 MCG/ACT NA SUSP: 2.0000 | Freq: Every day | NASAL | Status: DC

## 2015-05-24 NOTE — Telephone Encounter (Signed)
Pt informed Rx is ready 

## 2015-05-24 NOTE — Telephone Encounter (Signed)
Last refill 3/31 for # 30 Last visit 3/31  Pt # [phone]

## 2015-05-24 NOTE — Telephone Encounter (Signed)
Appropriate to refill. Needs June appt with me. Min opioid use. Cont to monitor usage.

## 2015-06-15 ENCOUNTER — Telehealth: Payer: Self-pay | Admitting: Internal Medicine

## 2015-06-15 NOTE — Telephone Encounter (Signed)
Call to patient to confirm appointment for 06/16/15 at 9:15 lmtcb

## 2015-06-16 ENCOUNTER — Ambulatory Visit: Payer: Commercial Managed Care - HMO | Admitting: Internal Medicine

## 2015-06-23 ENCOUNTER — Ambulatory Visit: Payer: Commercial Managed Care - HMO | Admitting: Internal Medicine

## 2015-06-24 ENCOUNTER — Ambulatory Visit: Payer: Commercial Managed Care - HMO | Admitting: Internal Medicine

## 2015-07-11 ENCOUNTER — Encounter: Payer: Self-pay | Admitting: Physical Medicine & Rehabilitation

## 2015-07-11 ENCOUNTER — Encounter
Payer: Commercial Managed Care - HMO | Attending: Physical Medicine & Rehabilitation | Admitting: Physical Medicine & Rehabilitation

## 2015-07-11 VITALS — BP 121/60 | HR 92 | Resp 14

## 2015-07-11 DIAGNOSIS — E1140 Type 2 diabetes mellitus with diabetic neuropathy, unspecified: Secondary | ICD-10-CM

## 2015-07-11 DIAGNOSIS — G894 Chronic pain syndrome: Secondary | ICD-10-CM | POA: Diagnosis not present

## 2015-07-11 DIAGNOSIS — E1142 Type 2 diabetes mellitus with diabetic polyneuropathy: Secondary | ICD-10-CM

## 2015-07-11 DIAGNOSIS — M545 Low back pain: Secondary | ICD-10-CM | POA: Diagnosis not present

## 2015-07-11 DIAGNOSIS — G8929 Other chronic pain: Secondary | ICD-10-CM | POA: Diagnosis not present

## 2015-07-11 DIAGNOSIS — G718 Other primary disorders of muscles: Secondary | ICD-10-CM | POA: Insufficient documentation

## 2015-07-11 NOTE — Patient Instructions (Signed)
CONTINUE TO MANAGE YOUR STRESS, WORK ON REGULAR STRETCHING, POSTURE, AND RANGE OF MOTION

## 2015-07-11 NOTE — Progress Notes (Signed)
Subjective:    [name], male    DOB: [birth year], 54 y.o.   MRN: [id number]  HPI   [name] is here in follow up of his low back pain. He doesn't feel that his back is locking up like it once was. He is doing some stretching which helps. He also feels that some of the stressors in his life which are improved! He is no longer taking the celebrex or gabapentin.    [name] has a contract with his PCP's  office for hydrocodone though he is rarely needing to use it anymore.  His diabetes has been under better control.   Pain Inventory Average Pain 8 Pain Right Now 0 My pain is tingling and aching  In the last 24 hours, has pain interfered with the following? General activity 7 Relation with others 7 Enjoyment of life 8 What TIME of day is your pain at its worst? varies Sleep (in general) NA  Pain is worse with: walking, bending, standing and some activites Pain improves with: rest, heat/ice, therapy/exercise and TENS Relief from Meds: 8  Mobility walk without assistance how many minutes can you walk? 20 ability to climb steps?  no do you drive?  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  no  Neuro/Psych weakness tingling spasms  Prior Studies Any changes since last visit?  no  Physicians involved in your care Any changes since last visit?  no   Family History  Problem Relation Age of Onset  . Diabetes Mother   . Hypertension Mother   . Hyperlipidemia Mother   . Diabetes Brother   . Hypertension Brother   . Heart attack Neg Hx   . Sudden death Neg Hx    History   Social History  . Marital Status: Single    Spouse Name: N/A  . Number of Children: N/A  . Years of Education: N/A   Social History Main Topics  . Smoking status: Current Some Day Smoker -- 0.10 packs/day    Types: Cigarettes, Cigars    Last Attempt to Quit: 01/05/2012  . Smokeless tobacco: Never Used       Comment: 2 cigs/week  . Alcohol Use: 3.0 oz/week    5 Cans of beer per week  . Drug Use: No  . Sexual Activity: Yes    Birth Control/ Protection: None   Other Topics Concern  . None   Social History Narrative   Prior incarceration. Tattoos, inc ones obtained in prison.    On disability since 09/2013   History reviewed. No pertinent past surgical history. Past Medical History  Diagnosis Date  . Diabetes mellitus   . Neuromuscular disorder   . Pancreatitis 01/09/2012  . Hypertension goal BP (blood pressure) < 140/80   . Hepatitis C     [id number]   BP 121/60 mmHg  Pulse 92  Resp 14  SpO2 98%  Opioid Risk Score:   Fall Risk Score:  `1  Depression screen PHQ 2/9  Depression screen [facility] 2/9 07/11/2015 03/30/2015 03/28/2015 03/17/2015 02/08/2015 12/22/2014 10/28/2014  Decreased Interest 0 0 0 0 1 0 0  Down, Depressed, Hopeless 0 0 0 1 1 - 0  PHQ - 2 Score 0 0 0 1 2 0 0  Altered sleeping - 0 - - 3 - -  Tired, decreased energy - 0 - - 3 - -  Change  in appetite - 0 - - 3 - -  Feeling bad or failure about yourself  - 0 - - 0 - -  Trouble concentrating - 0 - - 0 - -  Moving slowly or fidgety/restless - 0 - - 3 - -  Suicidal thoughts - 0 - - 0 - -  PHQ-9 Score - 0 - - 14 - -     Review of Systems  Constitutional: Positive for unexpected weight change.       Loss  Endocrine:       High blood sugars  Musculoskeletal:       Spasms  Skin: Positive for rash.  Neurological: Positive for weakness.       Tingling  All other systems reviewed and are negative.      Objective:   Physical Exam   General: Alert and oriented x 3, No apparent distress. Cooperative with exam but needed to be re-directed numerous times to stay on task.  HEENT: Head is normocephalic, atraumatic, PERRLA, EOMI, sclera anicteric, oral mucosa pink and moist, dentition intact, ext ear canals clear,  Neck: Supple without JVD or lymphadenopathy  Heart: Reg rate and rhythm. No murmurs rubs or gallops   Chest: CTA bilaterally without wheezes, rales, or rhonchi; no distress  Abdomen: Soft, non-tender, non-distended, bowel sounds positive.  Extremities: No clubbing, cyanosis, or edema. Pulses are 2+  Skin: Clean and intact without signs of breakdown  Neuro: Pt is cognitively appropriate with normal insight, memory, and awareness. Cranial nerves 2-12 are intact. Sensory exam is decreased in a stocking glove distribution in both legs below the knees as well as the finger tips. He has some sensory loss along the medial right knee.. Reflexes are 2+ in all 4's. Fine motor coordination is intact. No tremors. Motor function is grossly 5/5 except for pain inhibition in the right leg. Musculoskeletal: He is limited in lumbar flexion to about 80 degrees. Facet maneuvers were equivocal on the right. Extension provided minimal pain. SLR was equiovcal, FABER was negative. t. Gait appeared generally normal. No gross spinal or pelvic symmetry abnl.  Psych: Pt's affect is appropriate. Pt is cooperative, a little high strung.   Assessment & Plan:   1. Chronic low back pain. 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 Relatively normal lumbar MRI from 2013  4. Diabetic polyneuropathy   Plan:  1. Hydrocodone prn per pcp (using rarely) 2. Baclofen prn for muscle spasm. Reviewed stretching, posture, ROM  3. Reviewed the importance of stress management,mood and its impact on pain.  4. His UDS was positive for THC---non-narcotic mgt only here.  5. Remain off gabapentin 6. Follow up with me in about 6 months. 15 minutes of face to face patient care time were spent during this visit. All questions were encouraged and answered.

## 2015-07-18 ENCOUNTER — Ambulatory Visit: Payer: Commercial Managed Care - HMO | Admitting: Physical Medicine & Rehabilitation

## 2015-08-23 ENCOUNTER — Other Ambulatory Visit: Payer: Self-pay | Admitting: Internal Medicine

## 2015-08-23 DIAGNOSIS — G8929 Other chronic pain: Secondary | ICD-10-CM

## 2015-08-23 DIAGNOSIS — M545 Low back pain: Principal | ICD-10-CM

## 2015-08-23 NOTE — Telephone Encounter (Signed)
Pt called requesting hydrocodone to be filled.  °

## 2015-08-23 NOTE — Telephone Encounter (Signed)
Last filled at pharm 05/24/2015 Last visit 4/13/ 2016, next appt scheduled for 09/08/2015, missed his last appt due to mother's illness, he will be at 9/22 appt Last UDS 02/08/2015

## 2015-08-24 MED ORDER — HYDROCODONE-ACETAMINOPHEN 7.5-325 MG PO TABS: 1.0000 | ORAL_TABLET | Freq: Four times a day (QID) | ORAL | Status: DC | PRN

## 2015-08-24 NOTE — Telephone Encounter (Signed)
Pls ask if he is taking harvoni. If so, omeprazole is contraindicated. If not, I will refill.

## 2015-08-24 NOTE — Telephone Encounter (Signed)
30 pills in 3 months Has appt later in month Database OK

## 2015-08-24 NOTE — Telephone Encounter (Signed)
Pt informed

## 2015-08-26 ENCOUNTER — Other Ambulatory Visit: Payer: Self-pay | Admitting: *Deleted

## 2015-08-26 DIAGNOSIS — M5416 Radiculopathy, lumbar region: Secondary | ICD-10-CM

## 2015-08-26 DIAGNOSIS — M47816 Spondylosis without myelopathy or radiculopathy, lumbar region: Secondary | ICD-10-CM

## 2015-08-26 MED ORDER — BACLOFEN 10 MG PO TABS: 10.0000 mg | ORAL_TABLET | Freq: Three times a day (TID) | ORAL | Status: DC | PRN

## 2015-08-31 NOTE — Telephone Encounter (Signed)
Any info on the harvoni?

## 2015-08-31 NOTE — Telephone Encounter (Signed)
Message left on ID recording home and mobile phone number to call clinic.

## 2015-09-01 ENCOUNTER — Other Ambulatory Visit: Payer: Self-pay | Admitting: *Deleted

## 2015-09-01 MED ORDER — LANCET DEVICES MISC: 1.0000 | Freq: Two times a day (BID) | Status: DC

## 2015-09-01 NOTE — Telephone Encounter (Signed)
I cannot Rx if there is any chance he is on HArvoni. He will need to wait for appt or he can come sooner with meds if needs earlier.

## 2015-09-01 NOTE — Telephone Encounter (Signed)
Pt calls and states he does not think he is taking the harvoni, it is hard for him to read what each medicine is, he states he will bring all his meds with him to his appt

## 2015-09-08 ENCOUNTER — Encounter: Payer: Self-pay | Admitting: Internal Medicine

## 2015-09-08 ENCOUNTER — Ambulatory Visit (INDEPENDENT_AMBULATORY_CARE_PROVIDER_SITE_OTHER): Payer: Commercial Managed Care - HMO | Admitting: Internal Medicine

## 2015-09-08 ENCOUNTER — Telehealth: Payer: Self-pay | Admitting: *Deleted

## 2015-09-08 VITALS — BP 111/82 | HR 100 | Temp 97.6°F | Wt 180.0 lb

## 2015-09-08 DIAGNOSIS — E784 Other hyperlipidemia: Secondary | ICD-10-CM

## 2015-09-08 DIAGNOSIS — F1120 Opioid dependence, uncomplicated: Secondary | ICD-10-CM

## 2015-09-08 DIAGNOSIS — Z79899 Other long term (current) drug therapy: Secondary | ICD-10-CM

## 2015-09-08 DIAGNOSIS — K219 Gastro-esophageal reflux disease without esophagitis: Secondary | ICD-10-CM

## 2015-09-08 DIAGNOSIS — B182 Chronic viral hepatitis C: Secondary | ICD-10-CM

## 2015-09-08 DIAGNOSIS — E1169 Type 2 diabetes mellitus with other specified complication: Secondary | ICD-10-CM

## 2015-09-08 DIAGNOSIS — Z79891 Long term (current) use of opiate analgesic: Secondary | ICD-10-CM

## 2015-09-08 DIAGNOSIS — Z23 Encounter for immunization: Secondary | ICD-10-CM

## 2015-09-08 DIAGNOSIS — I10 Essential (primary) hypertension: Secondary | ICD-10-CM

## 2015-09-08 DIAGNOSIS — E785 Hyperlipidemia, unspecified: Secondary | ICD-10-CM

## 2015-09-08 DIAGNOSIS — M5410 Radiculopathy, site unspecified: Secondary | ICD-10-CM | POA: Diagnosis not present

## 2015-09-08 DIAGNOSIS — E1142 Type 2 diabetes mellitus with diabetic polyneuropathy: Secondary | ICD-10-CM

## 2015-09-08 DIAGNOSIS — E1165 Type 2 diabetes mellitus with hyperglycemia: Secondary | ICD-10-CM | POA: Diagnosis not present

## 2015-09-08 DIAGNOSIS — J309 Allergic rhinitis, unspecified: Secondary | ICD-10-CM

## 2015-09-08 DIAGNOSIS — E1140 Type 2 diabetes mellitus with diabetic neuropathy, unspecified: Secondary | ICD-10-CM | POA: Diagnosis not present

## 2015-09-08 DIAGNOSIS — Z0000 Encounter for general adult medical examination without abnormal findings: Secondary | ICD-10-CM

## 2015-09-08 DIAGNOSIS — Z1211 Encounter for screening for malignant neoplasm of colon: Secondary | ICD-10-CM

## 2015-09-08 DIAGNOSIS — E1149 Type 2 diabetes mellitus with other diabetic neurological complication: Secondary | ICD-10-CM

## 2015-09-08 LAB — GLUCOSE, CAPILLARY: Glucose-Capillary: 196 mg/dL — ABNORMAL HIGH (ref 65–99)

## 2015-09-08 LAB — POCT GLYCOSYLATED HEMOGLOBIN (HGB A1C): Hemoglobin A1C: 9

## 2015-09-08 MED ORDER — ACCU-CHEK AVIVA PLUS W/DEVICE KIT: 1.0000 | PACK | Freq: Two times a day (BID) | Status: DC

## 2015-09-08 MED ORDER — PIOGLITAZONE HCL 30 MG PO TABS: 30.0000 mg | ORAL_TABLET | Freq: Every day | ORAL | Status: DC

## 2015-09-08 MED ORDER — GLUCOSE BLOOD VI STRP: ORAL_STRIP | Status: DC

## 2015-09-08 NOTE — Assessment & Plan Note (Addendum)
Does not want colonoscopy. Sent home with stool cards. He had been referred to GI due to h/o tarry stools but did not keep appt. He is not having that any more, HgB is nl, on PPI, no evidence of cirrhosis and therefore need for varices eval, and not interested in GI appt. Cont to follow. Flu shot today.

## 2015-09-08 NOTE — Progress Notes (Signed)
   Subjective:    [name], male    DOB: Mar 25, 1961, 54 y.o.   MRN: [id number]  HPI  ARWIN BISCEGLIA is here for DM F/U. Please see the A&P for the status of the pt's chronic medical problems.  Review of Systems  Constitutional: Negative for unexpected weight change.  Respiratory: Negative for shortness of breath.   Cardiovascular: Negative for chest pain.  Gastrointestinal: Positive for nausea and diarrhea. Negative for constipation, blood in stool and anal bleeding.  Musculoskeletal: Positive for back pain and gait problem.  Skin: Positive for rash.  Psychiatric/Behavioral: Positive for agitation.       Family issues today making him worried and irritated and angry       Objective:   Physical Exam  Constitutional: He appears well-developed and well-nourished. No distress.  HENT:  Head: Normocephalic and atraumatic.  Right Ear: External ear normal.  Left Ear: External ear normal.  Nose: Nose normal.  Eyes: Conjunctivae and EOM are normal.  Cardiovascular: Normal rate, regular rhythm and normal heart sounds.   Pulmonary/Chest: Effort normal and breath sounds normal.  Musculoskeletal: Normal range of motion. He exhibits no edema.  Neurological: He is alert.  Skin: Skin is warm and dry. He is not diaphoretic.  Callous on L lateral foot. Tender. Multiple tattoos Tinea btw toes.  Psychiatric: He has a normal mood and affect. His behavior is normal. Judgment and thought content normal.          Assessment & Plan:

## 2015-09-08 NOTE — Patient Instructions (Signed)
1. Start a baby 81mg  aspirin daily 2. Stop metformin (sugar pill) 3. Start actos 30 mg once a day 4. Complete the stool cards

## 2015-09-08 NOTE — Assessment & Plan Note (Signed)
See opioid dependence

## 2015-09-08 NOTE — Assessment & Plan Note (Signed)
Doing quite well on flonase. Controls all sxs. Cont med.

## 2015-09-08 NOTE — Assessment & Plan Note (Signed)
BP Readings from Last 3 Encounters:  09/08/15 111/82  07/11/15 121/60  04/20/15 146/94   BP great on lisinopril 20. Cont med.

## 2015-09-08 NOTE — Assessment & Plan Note (Signed)
Overall he is doing better. Off the gabapentin. On baclofen PRN. Also on celebrex. Has hydrocodone 7.5 PRN. Got 30 on 3/31, 6/7, and 9/8. Dr Charm Barges note indicated he is not requiring it much so will try to follow refills requests and make certain it is being used infreq and titrate to OFF.  Again, he had multiple bottles of celebrex, baclofen. Still had flexeril. Also other bottles of non pain meds. I reviewed all meds with pt and asked Dr Maudie Mercury to also review. Pharmacy called and cancelled all unnecessary meds.

## 2015-09-08 NOTE — Progress Notes (Signed)
Pharmacy contacted to d/c metformin, cyclobenzaprine, gabapentin

## 2015-09-08 NOTE — Assessment & Plan Note (Addendum)
Lab Results  Component Value Date   HGBA1C 9.0 09/08/2015    A1C up from 7.4 to 7.0 to 9 today. Supposed to be on metformin 1000 BID. The smell of the pills makes his stomach "tunr" and causes "gren foam". I think this means he gets nauseous and has a bit of mucous prod'n bc he denies vomiting. He also has 4 runny BM a day. Bc of this, he is not taking the full dose. His pharmacist rec changing to Metformin ER bc it would not have the same smell but I doubt that would help the DM. Stop the metformin and use Actos 30. Actos has caution in liver failure but his LFT's, coags, alb, and plts are all nl so I doubt he has sig liver dysfxn despite the Hep C. He has no known HF or thyroid issues. F/U in 3 months.

## 2015-09-08 NOTE — Addendum Note (Signed)
Addended by: Larey Dresser A on: 09/08/2015 04:45 PM   Modules accepted: Orders

## 2015-09-08 NOTE — Assessment & Plan Note (Addendum)
He has low health care literacy and couldn't remember which doctors he had seen. Saw Dr Linus Salmons April and unknown the outcome although harvoni on med list. U/S showed F2 and some F3. Discussed with Dr Linus Salmons who will investigate and get back.

## 2015-09-08 NOTE — Assessment & Plan Note (Signed)
He is now back on prilosec 40. Had been on 20. Likely med confusion. Will discuss next time, reducing back to 20.

## 2015-09-08 NOTE — Assessment & Plan Note (Signed)
He is on Simva 40. 10 yr CHD score is > 20%. I rec that he start 81 mg ASA QD. Due to med confusion today, I did not increase to high intensity statin.

## 2015-09-08 NOTE — Telephone Encounter (Signed)
Call made to pt's pharmacy for "Lancing Device".  Verbal rx given over the phone (accu-chek aviva meter). Phone call complete...Marland KitchenDespina Hidden Cassady9/22/20164:51 PM

## 2015-09-12 ENCOUNTER — Other Ambulatory Visit: Payer: Self-pay | Admitting: Pharmacist Clinician (PhC)/ Clinical Pharmacy Specialist

## 2015-09-12 MED ORDER — LEDIPASVIR-SOFOSBUVIR 90-400 MG PO TABS: 1.0000 | ORAL_TABLET | Freq: Every day | ORAL | Status: DC

## 2015-09-16 ENCOUNTER — Encounter: Payer: Self-pay | Admitting: Pharmacy Technician

## 2015-09-19 ENCOUNTER — Encounter (HOSPITAL_COMMUNITY): Payer: Self-pay | Admitting: *Deleted

## 2015-09-19 ENCOUNTER — Emergency Department (HOSPITAL_COMMUNITY)
Admission: EM | Admit: 2015-09-19 | Discharge: 2015-09-19 | Disposition: A | Discharge status: Home / Self Care | Payer: Commercial Managed Care - HMO | Attending: Emergency Medicine | Admitting: Emergency Medicine

## 2015-09-19 DIAGNOSIS — Z79899 Other long term (current) drug therapy: Secondary | ICD-10-CM | POA: Diagnosis not present

## 2015-09-19 DIAGNOSIS — I10 Essential (primary) hypertension: Secondary | ICD-10-CM | POA: Insufficient documentation

## 2015-09-19 DIAGNOSIS — E119 Type 2 diabetes mellitus without complications: Secondary | ICD-10-CM | POA: Insufficient documentation

## 2015-09-19 DIAGNOSIS — M62830 Muscle spasm of back: Secondary | ICD-10-CM | POA: Insufficient documentation

## 2015-09-19 DIAGNOSIS — F17210 Nicotine dependence, cigarettes, uncomplicated: Secondary | ICD-10-CM | POA: Diagnosis not present

## 2015-09-19 DIAGNOSIS — Z720 Tobacco use: Secondary | ICD-10-CM | POA: Diagnosis not present

## 2015-09-19 DIAGNOSIS — M545 Low back pain, unspecified: Secondary | ICD-10-CM

## 2015-09-19 DIAGNOSIS — Z7951 Long term (current) use of inhaled steroids: Secondary | ICD-10-CM | POA: Insufficient documentation

## 2015-09-19 DIAGNOSIS — Z8619 Personal history of other infectious and parasitic diseases: Secondary | ICD-10-CM | POA: Diagnosis not present

## 2015-09-19 DIAGNOSIS — Z8719 Personal history of other diseases of the digestive system: Secondary | ICD-10-CM | POA: Insufficient documentation

## 2015-09-19 DIAGNOSIS — Z8669 Personal history of other diseases of the nervous system and sense organs: Secondary | ICD-10-CM | POA: Diagnosis not present

## 2015-09-19 MED ORDER — KETOROLAC TROMETHAMINE 30 MG/ML IJ SOLN
60.0000 mg | Freq: Once | INTRAMUSCULAR | Status: AC
Start: 2015-09-19 — End: 2015-09-19
  Administered 2015-09-19: 60 mg via INTRAMUSCULAR
  Filled 2015-09-19: qty 2

## 2015-09-19 NOTE — Discharge Instructions (Signed)
Back Pain: Your back pain should be treated with medicines such as ibuprofen or aleve and this back pain should get better over the next 2 weeks.  However if you develop severe or worsening pain, low back pain with fever, numbness, weakness or inability to walk or urinate, you should return to the ER immediately.  Please follow up with your doctor this week for a recheck if still having symptoms.  Low back pain is discomfort in the lower back that may be due to injuries to muscles and ligaments around the spine.  Occasionally, it may be caused by a a problem to a part of the spine called a disc.  The pain may last several days or a week;  However, most patients get completely well in 4 weeks.  Self - care:  The application of heat can help soothe the pain.  Maintaining your daily activities, including walking, is encourged, as it will help you get better faster than just staying in bed. Perform gentle stretching as discussed. Drink plenty of fluids.  Medications are also useful to help with pain control.  A commonly prescribed medication includes your home norco.  Do not drive or operate heavy machinery while taking this medication.  Non steroidal anti inflammatory medications including Ibuprofen and naproxen;  These medications help both pain and swelling and are very useful in treating back pain.  They should be taken with food, as they can cause stomach upset, and more seriously, stomach bleeding.    Muscle relaxants:  These medications can help with muscle tightness that is a cause of lower back pain.  Most of these medications can cause drowsiness, and it is not safe to drive or use dangerous machinery while taking them.  SEEK IMMEDIATE MEDICAL ATTENTION IF: New numbness, tingling, weakness, or problem with the use of your arms or legs.  Severe back pain not relieved with medications.  Difficulty with or loss of control of your bowel or bladder control.  Increasing pain in any areas of the body  (such as chest or abdominal pain).  Shortness of breath, dizziness or fainting.  Nausea (feeling sick to your stomach), vomiting, fever, or sweats.  You will need to follow up with  Your primary healthcare provider in 1-2 weeks for reassessment.   Back Pain, Adult Back pain is very common. The pain often gets better over time. The cause of back pain is usually not dangerous. Most people can learn to manage their back pain on their own.  HOME CARE   Stay active. Start with short walks on flat ground if you can. Try to walk farther each day.  Do not sit, drive, or stand in one place for more than 30 minutes. Do not stay in bed.  Do not avoid exercise or work. Activity can help your back heal faster.  Be careful when you bend or lift an object. Bend at your knees, keep the object close to you, and do not twist.  Sleep on a firm mattress. Lie on your side, and bend your knees. If you lie on your back, put a pillow under your knees.  Only take medicines as told by your doctor.  Put ice on the injured area.  Put ice in a plastic bag.  Place a towel between your skin and the bag.  Leave the ice on for 15-20 minutes, 03-04 times a day for the first 2 to 3 days. After that, you can switch between ice and heat packs.  Ask your  doctor about back exercises or massage.  Avoid feeling anxious or stressed. Find good ways to deal with stress, such as exercise. GET HELP RIGHT AWAY IF:   Your pain does not go away with rest or medicine.  Your pain does not go away in 1 week.  You have new problems.  You do not feel well.  The pain spreads into your legs.  You cannot control when you poop (bowel movement) or pee (urinate).  Your arms or legs feel weak or lose feeling (numbness).  You feel sick to your stomach (nauseous) or throw up (vomit).  You have belly (abdominal) pain.  You feel like you may pass out (faint). MAKE SURE YOU:   Understand these instructions.  Will watch  your condition.  Will get help right away if you are not doing well or get worse. Document Released: 05/21/2008 Document Revised: 02/25/2012 Document Reviewed: 04/06/2014 [facility] Patient Information 2015 [place], [place]. This information is not intended to replace advice given to you by your health care provider. Make sure you discuss any questions you have with your health care provider.  Back Exercises Back exercises help treat and prevent back injuries. The goal is to increase your strength in your belly (abdominal) and back muscles. These exercises can also help with flexibility. Start these exercises when told by your doctor. HOME CARE Back exercises include: Pelvic Tilt.  Lie on your back with your knees bent. Tilt your pelvis until the lower part of your back is against the floor. Hold this position 5 to 10 sec. Repeat this exercise 5 to 10 times. Knee to Chest.  Pull 1 knee up against your chest and hold for 20 to 30 seconds. Repeat this with the other knee. This may be done with the other leg straight or bent, whichever feels better. Then, pull both knees up against your chest. Sit-Ups or Curl-Ups.  Bend your knees 90 degrees. Start with tilting your pelvis, and do a partial, slow sit-up. Only lift your upper half 30 to 45 degrees off the floor. Take at least 2 to 3 seonds for each sit-up. Do not do sit-ups with your knees out straight. If partial sit-ups are difficult, simply do the above but with only tightening your belly (abdominal) muscles and holding it as told. Hip-Lift.  Lie on your back with your knees flexed 90 degrees. Push down with your feet and shoulders as you raise your hips 2 inches off the floor. Hold for 10 seconds, repeat 5 to 10 times. Back Arches.  Lie on your stomach. Prop yourself up on bent elbows. Slowly press on your hands, causing an arch in your low back. Repeat 3 to 5 times. Shoulder-Lifts.  Lie face down with arms beside your body. Keep hips and  belly pressed to floor as you slowly lift your head and shoulders off the floor. Do not overdo your exercises. Be careful in the beginning. Exercises may cause you some mild back discomfort. If the pain lasts for more than 15 minutes, stop the exercises until you see your doctor. Improvement with exercise for back problems is slow.  Document Released: 01/05/2011 Document Revised: 02/25/2012 Document Reviewed: 10/04/2011 [facility] Patient Information 2015 [place], [place]. This information is not intended to replace advice given to you by your health care provider. Make sure you discuss any questions you have with your health care provider.  Back Injury Prevention Back injuries can be extremely painful and difficult to heal. After having one back injury, you are much  more likely to experience another later on. It is important to learn how to avoid injuring or re-injuring your back. The following tips can help you to prevent a back injury. PHYSICAL FITNESS  Exercise regularly and try to develop good tone in your abdominal muscles. Your abdominal muscles provide a lot of the support needed by your back.  Do aerobic exercises (walking, jogging, biking, swimming) regularly.  Do exercises that increase balance and strength (tai chi, yoga) regularly. This can decrease your risk of falling and injuring your back.  Stretch before and after exercising.  Maintain a healthy weight. The more you weigh, the more stress is placed on your back. For every pound of weight, 10 times that amount of pressure is placed on the back. DIET  Talk to your caregiver about how much calcium and vitamin D you need per day. These nutrients help to prevent weakening of the bones (osteoporosis). Osteoporosis can cause broken (fractured) bones that lead to back pain.  Include good sources of calcium in your diet, such as dairy products, green, leafy vegetables, and products with calcium added (fortified).  Include good sources  of vitamin D in your diet, such as milk and foods that are fortified with vitamin D.  Consider taking a nutritional supplement or a multivitamin if needed.  Stop smoking if you smoke. POSTURE  Sit and stand up straight. Avoid leaning forward when you sit or hunching over when you stand.  Choose chairs with good low back (lumbar) support.  If you work at a desk, sit close to your work so you do not need to lean over. Keep your chin tucked in. Keep your neck drawn back and elbows bent at a right angle. Your arms should look like the letter "L."  Sit high and close to the steering wheel when you drive. Add a lumbar support to your car seat if needed.  Avoid sitting or standing in one position for too long. Take breaks to get up, stretch, and walk around at least once every hour. Take breaks if you are driving for long periods of time.  Sleep on your side with your knees slightly bent, or sleep on your back with a pillow under your knees. Do not sleep on your stomach. LIFTING, TWISTING, AND REACHING  Avoid heavy lifting, especially repetitive lifting. If you must do heavy lifting:  Stretch before lifting.  Work slowly.  Rest between lifts.  Use carts and dollies to move objects when possible.  Make several small trips instead of carrying 1 heavy load.  Ask for help when you need it.  Ask for help when moving big, awkward objects.  Follow these steps when lifting:  Stand with your feet shoulder-width apart.  Get as close to the object as you can. Do not try to pick up heavy objects that are far from your body.  Use handles or lifting straps if they are available.  Bend at your knees. Squat down, but keep your heels off the floor.  Keep your shoulders pulled back, your chin tucked in, and your back straight.  Lift the object slowly, tightening the muscles in your legs, abdomen, and buttocks. Keep the object as close to the center of your body as possible.  When you put a  load down, use these same guidelines in reverse.  Do not:  Lift the object above your waist.  Twist at the waist while lifting or carrying a load. Move your feet if you need to turn, not your  waist. °· Bend over without bending at your knees. °· Avoid reaching over your head, across a table, or for an object on a high surface. °OTHER TIPS °· Avoid wet floors and keep sidewalks clear of ice to prevent falls. °· Do not sleep on a mattress that is too soft or too hard. °· Keep items that are used frequently within easy reach. °· Put heavier objects on shelves at waist level and lighter objects on lower or higher shelves. °· Find ways to decrease your stress, such as exercise, massage, or relaxation techniques. Stress can build up in your muscles. Tense muscles are more vulnerable to injury. °· Seek treatment for depression or anxiety if needed. These conditions can increase your risk of developing back pain. °SEEK MEDICAL CARE IF: °· You injure your back. °· You have questions about diet, exercise, or other ways to prevent back injuries. °MAKE SURE YOU: °· Understand these instructions. °· Will watch your condition. °· Will get help right away if you are not doing well or get worse. °Document Released: 01/10/2005 Document Revised: 02/25/2012 Document Reviewed: 01/14/2012 °[employer]® Patient Information ©2015 [employer]. This information is not intended to replace advice given to you by your health care provider. Make sure you discuss any questions you have with your health care provider. ° °Heat Therapy °Heat therapy can help make painful, stiff muscles and joints feel better. Do not use heat on new injuries. Wait at least 48 hours after an injury to use heat. Do not use heat when you have aches or pains right after an activity. If you still have pain 3 hours after stopping the activity, then you may use heat. °HOME CARE °Wet heat pack °· Soak a clean towel in warm water. Squeeze out the extra water. °· Put the  warm, wet towel in a plastic bag. °· Place a thin, dry towel between your skin and the bag. °· Put the heat pack on the area for 5 minutes, and check your skin. Your skin may be pink, but it should not be red. °· Leave the heat pack on the area for 15 to 30 minutes. °· Repeat this every 2 to 4 hours while awake. Do not use heat while you are sleeping. °Warm water bath °· Fill a tub with warm water. °· Place the affected body part in the tub. °· Soak the area for 20 to 40 minutes. °· Repeat as needed. °Hot water bottle °· Fill the water bottle half full with hot water. °· Press out the extra air. Close the cap tightly. °· Place a dry towel between your skin and the bottle. °· Put the bottle on the area for 5 minutes, and check your skin. Your skin may be pink, but it should not be red. °· Leave the bottle on the area for 15 to 30 minutes. °· Repeat this every 2 to 4 hours while awake. °Electric heating pad °· Place a dry towel between your skin and the heating pad. °· Set the heating pad on low heat. °· Put the heating pad on the area for 10 minutes, and check your skin. Your skin may be pink, but it should not be red. °· Leave the heating pad on the area for 20 to 40 minutes. °· Repeat this every 2 to 4 hours while awake. °· Do not lie on the heating pad. °· Do not fall asleep while using the heating pad. °· Do not use the heating pad near water. °GET HELP RIGHT AWAY   IF: °· You get blisters or red skin. °· Your skin is puffy (swollen), or you lose feeling (numbness) in the affected area. °· You have any new problems. °· Your problems are getting worse. °· You have any questions or concerns. °If you have any problems, stop using heat therapy until you see your doctor. °MAKE SURE YOU: °· Understand these instructions. °· Will watch your condition. °· Will get help right away if you are not doing well or get worse. °Document Released: 02/25/2012 Document Reviewed: 01/26/2014 °[employer]® Patient Information ©2015  [employer]. This information is not intended to replace advice given to you by your health care provider. Make sure you discuss any questions you have with your health care provider. ° °

## 2015-09-19 NOTE — ED Notes (Signed)
Declined W/C at D/C and was escorted to lobby by RN. 

## 2015-09-19 NOTE — ED Notes (Signed)
Pt speaking rapidly and keeps repeating he needs the shot in back he had several years ago. 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 DR Suzie Portela is PCP.

## 2015-09-19 NOTE — ED Notes (Signed)
PT reports Back pain for 3 days . Pt reports the pain throbs

## 2015-09-19 NOTE — ED Provider Notes (Signed)
CSN: [id number]     Arrival date & time 09/19/15  0750 History   First MD Initiated Contact with Patient 09/19/15 0831     Chief Complaint  Patient presents with  . Back Pain     (Consider location/radiation/quality/duration/timing/severity/associated sxs/prior Treatment) HPI Comments: Donald Palmer is a 54 y.o. male with a PMHx of chronic back pain, DM2, hep C, HTN, and pancreatitis, who presents to the ED with complaints of acute on chronic back pain. He describes his back pain is 9/10 constant aching and throbbing nonradiating low back pain with no known aggravating factors and unrelieved with his home baclofen, Flexeril, and Norco. No recent heavy lifting or twisting, no recent injuries. He states this is the same as his normal back pain. He attempted to go downstairs to his regular doctor, but they were not in.  He denies any fevers, chills, chest pain, shortness breath, abdominal pain, nausea, vomiting, dysuria, hematuria, testicular pain or swelling, incontinence of urine or stool, cauda equina symptoms, numbness, tingling, or weakness. Denies any headache or vision changes, no history of cancer or IV drug use.    Patient is a 54 y.o. male presenting with back pain. The history is provided by the patient. No language interpreter was used.  Back Pain Location:  Lumbar spine Quality:  Aching Radiates to:  Does not radiate Pain severity:  Severe Pain is:  Same all the time Onset quality:  Gradual Duration:  3 days Timing:  Constant Progression:  Unchanged Chronicity:  Chronic Context: not lifting heavy objects, not recent injury and not twisting   Relieved by:  Nothing Worsened by:  Nothing tried Ineffective treatments:  Muscle relaxants and narcotics Associated symptoms: no abdominal pain, no bladder incontinence, no bowel incontinence, no chest pain, no dysuria, no fever, no headaches, no numbness, no paresthesias, no perianal numbness and no weakness     Past Medical History    Diagnosis Date  . Diabetes mellitus   . Neuromuscular disorder ([place])   . Pancreatitis 01/09/2012  . Hypertension goal BP (blood pressure) < 140/80   . Hepatitis C     [id number]   History reviewed. No pertinent past surgical history. Family History  Problem Relation Age of Onset  . Diabetes Mother   . Hypertension Mother   . Hyperlipidemia Mother   . Diabetes Brother   . Hypertension Brother   . Heart attack Neg Hx   . Sudden death Neg Hx    Social History  Substance Use Topics  . Smoking status: Current Some Day Smoker -- 0.10 packs/day    Types: Cigarettes, Cigars    Last Attempt to Quit: 01/05/2012  . Smokeless tobacco: Never Used     Comment: 2 cigs/week  . Alcohol Use: 3.0 oz/week    5 Cans of beer per week    Review of Systems  Constitutional: Negative for fever and chills.  Eyes: Negative for visual disturbance.  Respiratory: Negative for shortness of breath.   Cardiovascular: Negative for chest pain.  Gastrointestinal: Negative for nausea, vomiting, abdominal pain and bowel incontinence.  Genitourinary: Negative for bladder incontinence, dysuria, hematuria, scrotal swelling, difficulty urinating (no incontinence) and testicular pain.  Musculoskeletal: Positive for back pain. Negative for myalgias and arthralgias.  Skin: Negative for color change.  Allergic/Immunologic: Positive for immunocompromised state (diabetic).  Neurological: Negative for weakness, numbness, headaches and paresthesias.  Psychiatric/Behavioral: Negative for confusion.   10 Systems reviewed and are negative for acute change except as noted in the HPI.  Allergies  Review of patient's allergies indicates no known allergies.  Home Medications   Prior to Admission medications   Medication Sig Start Date End Date Taking? Authorizing Provider  baclofen (LIORESAL) 10 MG tablet Take 1 tablet (10 mg total) by mouth every 8 (eight) hours as needed for muscle spasms. 08/26/15   [name],  MD  Blood Glucose Monitoring Suppl (ACCU-CHEK AVIVA PLUS) W/DEVICE KIT 1 each by Does not apply route 2 (two) times daily. To check blood sugar twice daily. diag code E11.40. Non insulin dependent. Please check with pt that this is the equipment he needs. 09/08/15   [name], MD  clotrimazole-betamethasone (LOTRISONE) cream Apply 1 application topically 2 (two) times daily. 04/20/15   [name], DPM  fluticasone (FLONASE) 50 MCG/ACT nasal spray Place 2 sprays into both nostrils daily. 05/24/15   [name], MD  glucose blood (ACCU-CHEK AVIVA PLUS) test strip To use twice daily to check blood sugar. diag code E11.40. Non insulin dependent 09/08/15   [name], MD  HYDROcodone-acetaminophen (NORCO) 7.5-325 MG per tablet Take 1 tablet by mouth every 6 (six) hours as needed for moderate pain. 08/24/15   [name], MD  Lancet Devices MISC 1 each by Does not apply route 2 (two) times daily. Please use to check blood sugar 2 times daily. diag code E11.40. noninsulin dependent 09/01/15   [name], MD  Ledipasvir-Sofosbuvir (HARVONI) 90-400 MG TABS Take 1 tablet by mouth daily. 09/12/15   Thayer Headings, MD  lisinopril (PRINIVIL,ZESTRIL) 20 MG tablet Take 1 tablet (20 mg total) by mouth daily. 12/22/14   Charlesetta Shanks, MD  pioglitazone (ACTOS) 30 MG tablet Take 1 tablet (30 mg total) by mouth daily. 09/08/15   [name], MD  simvastatin (ZOCOR) 40 MG tablet Take 1 tablet (40 mg total) by mouth every evening. 12/22/14 12/22/15  Langley Gauss Moding, MD   BP 140/95 mmHg  Pulse 70  Temp(Src) 97.8 F (36.6 C)  Resp 18  Ht _0  (1.778 m)  Wt 180 lb (81.647 kg)  BMI 25.83 kg/m2  SpO2 100% Physical Exam  Constitutional: He is oriented to person, place, and time. Vital signs are normal. He appears well-developed and well-nourished.  Non-toxic appearance. No distress.  Afebrile, nontoxic, NAD  HENT:  Head: Normocephalic and atraumatic.  Mouth/Throat:  Mucous membranes are normal.  Eyes: Conjunctivae and EOM are normal. Right eye exhibits no discharge. Left eye exhibits no discharge.  Neck: Normal range of motion. Neck supple.  Cardiovascular: Normal rate and intact distal pulses.   Pulmonary/Chest: Effort normal. No respiratory distress.  Abdominal: Normal appearance. He exhibits no distension.  Musculoskeletal: Normal range of motion.       Lumbar back: He exhibits tenderness and spasm. He exhibits no bony tenderness and no deformity.       Back:  Lumbar spine with FROM intact without spinous process TTP, no bony stepoffs or deformities, with diffuse b/l paraspinous muscle TTP and palpable muscle spasms. Strength 5/5 in all extremities, sensation grossly intact in all extremities, negative SLR bilaterally, gait steady and nonantalgic. No overlying skin changes.   Neurological: He is alert and oriented to person, place, and time. He has normal strength. No sensory deficit.  Skin: Skin is warm, dry and intact. No rash noted.  Psychiatric: He has a normal mood and affect.  Nursing note and vitals reviewed.   ED Course  Procedures (including critical care time) Labs Review Labs  Reviewed - No data to display  Imaging Review No results found. I have personally reviewed and evaluated these images and lab results as part of my medical decision-making.   EKG Interpretation None      MDM   Final diagnoses:  Bilateral low back pain without sciatica  Back muscle spasm  HTN (hypertension), benign    54 y.o. male here with acute on chronic low back pain. Requesting shot that was given to him in ED 22yr ago, chart review reveals this was toradol. Pt with chronic back pain meds at home. No red flag s/s of low back pain. No s/s of central cord compression or cauda equina. Lower extremities are neurovascularly intact and patient is ambulating without difficulty. Will give toradol, then discussed using his home meds for his chronic back pain.  Also discussed f/up with his PCP.   Patient was counseled on back pain precautions and told to do activity as tolerated but do not lift, push, or pull heavy objects more than 10 pounds for the next week. Patient counseled to use ice or heat on back for no longer than 15 minutes every hour.   Patient urged to follow-up with PCP if pain does not improve with treatment and rest or if pain becomes recurrent. Urged to return with worsening severe pain, loss of bowel or bladder control, trouble walking. The patient verbalizes understanding and agrees with the plan.   BP 140/95 mmHg  Pulse 70  Temp(Src) 97.8 F (36.6 C)  Resp 18  Ht _0  (1.778 m)  Wt 180 lb (81.647 kg)  BMI 25.83 kg/m2  SpO2 100%  Meds ordered this encounter  Medications  . ketorolac (TORADOL) 30 MG/ML injection 60 mg    Sig:      Amberlie Gaillard Camprubi-Soms, PA-C 09/19/15 07209 JDorie Rank MD 09/23/15 [phone]

## 2015-09-23 ENCOUNTER — Other Ambulatory Visit: Payer: Self-pay | Admitting: Internal Medicine

## 2015-09-26 ENCOUNTER — Other Ambulatory Visit: Payer: Self-pay | Admitting: Internal Medicine

## 2015-09-26 ENCOUNTER — Other Ambulatory Visit (INDEPENDENT_AMBULATORY_CARE_PROVIDER_SITE_OTHER): Payer: Commercial Managed Care - HMO

## 2015-09-26 DIAGNOSIS — Z1211 Encounter for screening for malignant neoplasm of colon: Secondary | ICD-10-CM

## 2015-09-26 LAB — POC HEMOCCULT BLD/STL (HOME/3-CARD/SCREEN)
Card #2 Fecal Occult Blod, POC: NEGATIVE
FECAL OCCULT BLD: NEGATIVE
FECAL OCCULT BLD: NEGATIVE

## 2015-09-26 NOTE — Telephone Encounter (Signed)
Pt called requesting test strips and Lancets to be filled @ [facility].

## 2015-09-26 NOTE — Addendum Note (Signed)
Addended by: Truddie Crumble on: 09/26/2015 02:35 PM   Modules accepted: Orders

## 2015-09-26 NOTE — Telephone Encounter (Signed)
Pt has refills at pharmacy. He is c/o co-pay.  He now thinks he may have enough to last until Jan. He will call [facility] if there is a problem.

## 2015-09-29 ENCOUNTER — Ambulatory Visit: Payer: Commercial Managed Care - HMO | Admitting: Pharmacist

## 2015-09-29 DIAGNOSIS — B182 Chronic viral hepatitis C: Secondary | ICD-10-CM

## 2015-09-29 NOTE — Progress Notes (Signed)
HPI: Donald Palmer is a 54 y.o. male who presents   Lab Results  Component Value Date   HCVGENOTYPE 1b 02/11/2015   Allergies: No Known Allergies   Past Medical History: Past Medical History  Diagnosis Date  . Diabetes mellitus   . Neuromuscular disorder ([place])   . Pancreatitis 01/09/2012  . Hypertension goal BP (blood pressure) < 140/80   . Hepatitis C     [id number]    Social History: Social History   Social History  . Marital Status: Single    Spouse Name: N/A  . Number of Children: N/A  . Years of Education: N/A   Social History Main Topics  . Smoking status: Current Some Day Smoker -- 0.10 packs/day    Types: Cigarettes, Cigars    Last Attempt to Quit: 01/05/2012  . Smokeless tobacco: Never Used     Comment: 2 cigs/week  . Alcohol Use: 3.0 oz/week    5 Cans of beer per week  . Drug Use: No  . Sexual Activity: Yes    Birth Control/ Protection: None   Other Topics Concern  . Not on file   Social History Narrative   Prior incarceration. Tattoos, inc ones obtained in prison.    On disability since 09/2013    Labs: HEP B S AB (no units)  Date Value  02/08/2015 NEG   HEPATITIS B SURFACE AG (no units)  Date Value  01/30/2012 NEGATIVE   HCV AB (no units)  Date Value  01/30/2012 REACTIVE*    Lab Results  Component Value Date   HCVGENOTYPE 1b 02/11/2015    Hepatitis C RNA quantitative Latest Ref Rng 02/08/2015 04/28/2012  HCV Quantitative <15 IU/mL 9776410(H) 6606301(S)  HCV Quantitative Log <1.18 log 10 6.99(H) 6.22(H)    AST (U/L)  Date Value  12/14/2014 41*  08/12/2013 30  06/22/2013 34   ALT (U/L)  Date Value  12/14/2014 39  08/12/2013 31  06/22/2013 38   INR (no units)  Date Value  02/08/2015 1.0    CrCl: CrCl cannot be calculated (Patient has no serum creatinine result on file.).  Fibrosis Score: F2/F3 as assessed by elastography   Previous Treatment Regimen: None  Assessment: Donald Palmer presents to the [place] [facility] clinic  today for follow-up of his Hepatitis C genotype 1b infection.  He comes in today complaining of extreme nausea. He was recently started on Actos by internal medicine and was taken off of metformin.  He states that this morning he took a metformin as well because his sugar was ~200. He started Harvoni ~5 or 6 days ago and has had no trouble thus far.  He has not missed any doses or had any side effects with his Harvoni. I reiterated the importance of not missing any doses of his Harvoni and to take it every day.  He did have Prilosec on his medication profile, but he was taken off of that before starting Harvoni. I deleted it off his medication profile. He will come in on November 1 at 10:15am for labs, and he will come in to see Dr. Linus Palmer on January 3 at 10:30am.  Recommendations: Continue taking Harvoni Talk to your primayr provider about your diabetes Follow-up with lab on November 1 at 10:15am Follow-up with Dr. Linus Palmer on January 3 at 10:30am  Vikkie Goeden L. Nicole Kindred, PharmD PGY2 Infectious Diseases Pharmacy Resident Pager: [phone] 09/29/2015 10:18 AM

## 2015-10-10 ENCOUNTER — Other Ambulatory Visit: Payer: Self-pay | Admitting: Internal Medicine

## 2015-10-10 DIAGNOSIS — G8929 Other chronic pain: Secondary | ICD-10-CM

## 2015-10-10 DIAGNOSIS — M545 Low back pain: Principal | ICD-10-CM

## 2015-10-10 MED ORDER — HYDROCODONE-ACETAMINOPHEN 7.5-325 MG PO TABS: 1.0000 | ORAL_TABLET | Freq: Four times a day (QID) | ORAL | Status: DC | PRN

## 2015-10-10 NOTE — Telephone Encounter (Signed)
Pt informed

## 2015-10-10 NOTE — Telephone Encounter (Signed)
Pt requesting hydrocodone to be filled.  °

## 2015-10-10 NOTE — Telephone Encounter (Signed)
Last office visit 9/22 Last UDS 2/23 Last refill 9/8 Future apt scheduled 12/15

## 2015-10-18 ENCOUNTER — Other Ambulatory Visit: Payer: Commercial Managed Care - HMO

## 2015-10-18 DIAGNOSIS — B182 Chronic viral hepatitis C: Secondary | ICD-10-CM | POA: Diagnosis not present

## 2015-10-19 LAB — HEPATITIS C RNA QUANTITATIVE: HCV Quantitative: NOT DETECTED IU/mL (ref <15)

## 2015-12-01 ENCOUNTER — Ambulatory Visit (INDEPENDENT_AMBULATORY_CARE_PROVIDER_SITE_OTHER): Payer: Commercial Managed Care - HMO | Admitting: Internal Medicine

## 2015-12-01 ENCOUNTER — Encounter: Payer: Self-pay | Admitting: Dietician

## 2015-12-01 VITALS — BP 154/103 | HR 89 | Temp 97.8°F | Wt 188.6 lb

## 2015-12-01 DIAGNOSIS — B182 Chronic viral hepatitis C: Secondary | ICD-10-CM | POA: Diagnosis not present

## 2015-12-01 DIAGNOSIS — E1140 Type 2 diabetes mellitus with diabetic neuropathy, unspecified: Secondary | ICD-10-CM | POA: Diagnosis not present

## 2015-12-01 DIAGNOSIS — E1169 Type 2 diabetes mellitus with other specified complication: Secondary | ICD-10-CM | POA: Diagnosis not present

## 2015-12-01 DIAGNOSIS — Z7984 Long term (current) use of oral hypoglycemic drugs: Secondary | ICD-10-CM | POA: Diagnosis not present

## 2015-12-01 DIAGNOSIS — E784 Other hyperlipidemia: Secondary | ICD-10-CM | POA: Diagnosis not present

## 2015-12-01 DIAGNOSIS — Z79891 Long term (current) use of opiate analgesic: Secondary | ICD-10-CM

## 2015-12-01 DIAGNOSIS — Z79899 Other long term (current) drug therapy: Secondary | ICD-10-CM

## 2015-12-01 DIAGNOSIS — I10 Essential (primary) hypertension: Secondary | ICD-10-CM | POA: Diagnosis not present

## 2015-12-01 DIAGNOSIS — M545 Low back pain, unspecified: Secondary | ICD-10-CM

## 2015-12-01 DIAGNOSIS — E785 Hyperlipidemia, unspecified: Secondary | ICD-10-CM

## 2015-12-01 DIAGNOSIS — F1120 Opioid dependence, uncomplicated: Secondary | ICD-10-CM

## 2015-12-01 DIAGNOSIS — G8929 Other chronic pain: Secondary | ICD-10-CM

## 2015-12-01 DIAGNOSIS — E1165 Type 2 diabetes mellitus with hyperglycemia: Secondary | ICD-10-CM | POA: Diagnosis not present

## 2015-12-01 DIAGNOSIS — Z0000 Encounter for general adult medical examination without abnormal findings: Secondary | ICD-10-CM

## 2015-12-01 LAB — GLUCOSE, CAPILLARY: GLUCOSE-CAPILLARY: 168 mg/dL — AB (ref 65–99)

## 2015-12-01 LAB — POCT GLYCOSYLATED HEMOGLOBIN (HGB A1C): Hemoglobin A1C: 8.5

## 2015-12-01 LAB — HM DIABETES EYE EXAM

## 2015-12-01 MED ORDER — CELECOXIB 200 MG PO CAPS: 200.0000 mg | ORAL_CAPSULE | Freq: Every day | ORAL | Status: DC

## 2015-12-01 MED ORDER — HYDROCODONE-ACETAMINOPHEN 7.5-325 MG PO TABS: 1.0000 | ORAL_TABLET | Freq: Four times a day (QID) | ORAL | Status: DC | PRN

## 2015-12-01 MED ORDER — PIOGLITAZONE HCL 45 MG PO TABS: 45.0000 mg | ORAL_TABLET | Freq: Every day | ORAL | Status: DC

## 2015-12-01 NOTE — Patient Instructions (Signed)
1. I will talk to pharmacy about your meds 2. I sent in refills which were needed 3. Try to celebrex for your calf pain 4. Do NOT take the stomach pill until you are off the harvoni 5. I am increasing your dose of Actos - sugar pill 6. See me in 3 months

## 2015-12-01 NOTE — Progress Notes (Signed)
Team care co-visit with Dr. Lynnae January  All medication(s) were reviewed with the patient, including name, instructions, indication, goals of therapy, potential side effects, importance of adherence, and safe use.  Notable findings  Patient still has a bottle from home of cyclobenzaprine which was d/c in September and he reported confusion with celecoxib. He is also taking baclofen. Patient educated as appropriate.  Patient also has a bottle of omeprazole which was d/c and interacts with Harvoni. Education provided as appropriate and OTC antacids recommended if needed in the future and until Harvoni course completed.  Education reinforced on pioglitazone dose increase.  Clinic disposal of cyclobenzaprine, omeprazole, and pioglitazone 30 mg. Pharmacy was also contacted to d/c.  Patient verbalized understanding by repeating back information and was advised to contact me if further medication-related questions arise. Patient was also provided an information handout.

## 2015-12-01 NOTE — Progress Notes (Signed)
   Subjective:    [name], male    DOB: 01-Aug-1961, 54 y.o.   MRN: [id number]  HPI  Donald Palmer is here for DM F/U. Please see the A&P for the status of the pt's chronic medical problems.  PMHx, Soc hx, and / or Fam hx : Brother died from AMI recently. Fam Hx + for DM   Review of Systems  Constitutional: Negative for appetite change and unexpected weight change.  Gastrointestinal: Negative for nausea and diarrhea.  Musculoskeletal: Positive for myalgias, back pain and gait problem.  Skin: Positive for rash.       Objective:   Physical Exam  Constitutional: He is oriented to person, place, and time. He appears well-developed and well-nourished. No distress.  HENT:  Head: Normocephalic and atraumatic.  Right Ear: External ear normal.  Left Ear: External ear normal.  Nose: Nose normal.  Eyes: Conjunctivae and EOM are normal.  Cardiovascular: Normal rate, regular rhythm and normal heart sounds.   Pulmonary/Chest: Effort normal and breath sounds normal.  Musculoskeletal: Normal range of motion. He exhibits no edema.  Neurological: He is alert and oriented to person, place, and time.  Skin: Skin is warm and dry. He is not diaphoretic.  Psychiatric: He has a normal mood and affect. His behavior is normal. Judgment and thought content normal.  I bit hyper today with pressured speech. Different than normal          Assessment & Plan:

## 2015-12-02 LAB — LIPID PANEL
Chol/HDL Ratio: 5 ratio units (ref 0.0–5.0)
Cholesterol, Total: 215 mg/dL — ABNORMAL HIGH (ref 100–199)
HDL: 43 mg/dL (ref >39)
LDL Calculated: 138 mg/dL — ABNORMAL HIGH (ref 0–99)
Triglycerides: 168 mg/dL — ABNORMAL HIGH (ref 0–149)
VLDL Cholesterol Cal: 34 mg/dL (ref 5–40)

## 2015-12-02 NOTE — Assessment & Plan Note (Signed)
He remains on his Simva 40. Once he is off Harvoni, discuss increased potency - 10 yr risk is > 20%.   PLAN : FLP today. Cont simva 20  LDL increased from 109 - 138. Address next appt

## 2015-12-02 NOTE — Assessment & Plan Note (Signed)
He had confusion over meds. He had med bottles, some duplicate. He is to see wDr Naaman Plummer soon. He takes the baclofen PRN. Not taking the celebrex. Taking hydrocodone PRN (got 30 on 10/24 and has 3 left). He had to go to ED recently and got toradol shot that really helped. C/O pain in back of calves B - sore and numb. No pins and needles. Cannot say if get worse with walking. When I squeezed calves he stated that felt good. It was challenging to get him to focus and answer directed questions. This does not seem to be claudication as no reste pain or increase with walking. Neuropathic pain would start in feet not calves. Not classical radicular pain.   PLAN :  Cont baclofen PRN Resume celebrex Cont hydrocodone - refill provided F/U

## 2015-12-02 NOTE — Assessment & Plan Note (Signed)
Intolerance to metformin - GI side effects. I changed to actos and he loves it!! He is on 30 mg daily and A1C went from 7.4 - 7.0 - 9.0 - 8.5 today. So A1C not controlled. Check CBG 3 times per week at home. There were 4 readings on his download covering 30 days.   PLAN : Increase actos to 45 daily Retinal camera today FLP today RTC 3 months

## 2015-12-02 NOTE — Assessment & Plan Note (Signed)
This got delayed 2/2 leg pain. Address next appt

## 2015-12-02 NOTE — Assessment & Plan Note (Signed)
BP Readings from Last 3 Encounters:  12/01/15 154/103  09/19/15 140/95  09/08/15 111/82    High today on his lisinopril 20 but previously well controlled. Ace only would not be great monotherapy for AA but had worked previously. Therefore, leave as is and recheck. The acute elevation today likely could be 2/2 hyper state although I do not know why he was hyper.   PLAN : cont lisinopril 20. Check next appt

## 2015-12-02 NOTE — Assessment & Plan Note (Signed)
He remains on his Harvoni. Most recent VL undetectable. Problem is that he had bottles of his PPI. Stated he isn't taking it but there overall isa lot of med confusion.   PLAN : stressed that he should not take PPI. Dr Maudie Mercury consult.

## 2015-12-06 ENCOUNTER — Telehealth: Payer: Self-pay | Admitting: Internal Medicine

## 2015-12-15 ENCOUNTER — Encounter: Payer: Self-pay | Admitting: Dietician

## 2015-12-20 ENCOUNTER — Ambulatory Visit (INDEPENDENT_AMBULATORY_CARE_PROVIDER_SITE_OTHER): Payer: Commercial Managed Care - HMO | Admitting: Internal Medicine

## 2015-12-20 ENCOUNTER — Encounter: Payer: Self-pay | Admitting: Internal Medicine

## 2015-12-20 VITALS — BP 151/95 | HR 76 | Temp 98.1°F | Ht 70.0 in | Wt 187.0 lb

## 2015-12-20 DIAGNOSIS — B182 Chronic viral hepatitis C: Secondary | ICD-10-CM

## 2015-12-20 DIAGNOSIS — K219 Gastro-esophageal reflux disease without esophagitis: Secondary | ICD-10-CM

## 2015-12-20 NOTE — Assessment & Plan Note (Signed)
Told him ok to restart omeprazole now that Harvoni has finished.

## 2015-12-20 NOTE — Assessment & Plan Note (Signed)
Finished.  LAb today and rtc 3-4 months for [facility]

## 2015-12-20 NOTE — Progress Notes (Signed)
   Subjective:    [name], male    DOB: 1960/12/22, 55 y.o.   MRN: [id number]  HPI Here for follow up of HCV.   Genotype 1b, viral load 9.7 million then after 1 month undetectable.  Issues with reflux and did start omeprazole during [place] treatment.  Now completed treatment about 1 week ago.  Elastography with F2/3.  Denies alcohol.    Review of Systems  Constitutional: Negative for fatigue.  Gastrointestinal:       Burning epigastric pain  Skin: Negative for rash.  Neurological: Negative for headaches.       Objective:   Physical Exam  Constitutional: He appears well-developed and well-nourished. No distress.  Eyes: No scleral icterus.  Cardiovascular: Normal rate, regular rhythm and normal heart sounds.   Skin: No rash noted.          Assessment & Plan:

## 2015-12-21 LAB — HEPATITIS C RNA QUANTITATIVE: HCV QUANT: NOT DETECTED [IU]/mL (ref <15)

## 2015-12-29 ENCOUNTER — Encounter: Payer: Self-pay | Admitting: Internal Medicine

## 2016-01-06 ENCOUNTER — Other Ambulatory Visit: Payer: Self-pay | Admitting: *Deleted

## 2016-01-06 ENCOUNTER — Ambulatory Visit (INDEPENDENT_AMBULATORY_CARE_PROVIDER_SITE_OTHER): Payer: Commercial Managed Care - HMO | Admitting: Internal Medicine

## 2016-01-06 ENCOUNTER — Encounter: Payer: Self-pay | Admitting: Internal Medicine

## 2016-01-06 VITALS — BP 141/87 | HR 94 | Temp 97.9°F | Ht 69.0 in | Wt 187.6 lb

## 2016-01-06 DIAGNOSIS — E1140 Type 2 diabetes mellitus with diabetic neuropathy, unspecified: Secondary | ICD-10-CM | POA: Diagnosis not present

## 2016-01-06 DIAGNOSIS — G8929 Other chronic pain: Secondary | ICD-10-CM

## 2016-01-06 DIAGNOSIS — E1142 Type 2 diabetes mellitus with diabetic polyneuropathy: Secondary | ICD-10-CM

## 2016-01-06 DIAGNOSIS — M545 Low back pain, unspecified: Secondary | ICD-10-CM

## 2016-01-06 DIAGNOSIS — I152 Hypertension secondary to endocrine disorders: Secondary | ICD-10-CM

## 2016-01-06 DIAGNOSIS — K219 Gastro-esophageal reflux disease without esophagitis: Secondary | ICD-10-CM

## 2016-01-06 DIAGNOSIS — E1165 Type 2 diabetes mellitus with hyperglycemia: Secondary | ICD-10-CM

## 2016-01-06 LAB — GLUCOSE, CAPILLARY: Glucose-Capillary: 203 mg/dL — ABNORMAL HIGH (ref 65–99)

## 2016-01-06 MED ORDER — SIMVASTATIN 40 MG PO TABS: 40.0000 mg | ORAL_TABLET | Freq: Every evening | ORAL | Status: DC

## 2016-01-06 MED ORDER — GABAPENTIN 300 MG PO CAPS: 300.0000 mg | ORAL_CAPSULE | Freq: Three times a day (TID) | ORAL | Status: DC

## 2016-01-06 MED ORDER — LISINOPRIL 20 MG PO TABS: 20.0000 mg | ORAL_TABLET | Freq: Every day | ORAL | Status: DC

## 2016-01-06 MED ORDER — HYDROCODONE-ACETAMINOPHEN 7.5-325 MG PO TABS: 1.0000 | ORAL_TABLET | Freq: Four times a day (QID) | ORAL | Status: DC | PRN

## 2016-01-06 MED ORDER — OMEPRAZOLE 20 MG PO CPDR: 20.0000 mg | DELAYED_RELEASE_CAPSULE | Freq: Every day | ORAL | Status: DC

## 2016-01-06 NOTE — Progress Notes (Signed)
[name], male   DOB: Jul 29, 1961, 55 y.o.   MRN: [id number]    Subjective:   [name] male   DOB: [birth year] 55 y.o.   MRN: [id number]  HPI: [name] is a 55 y.o. male with PMH as below, here for evaluation for foot fungus.  Please see Problem-Based charting for the status of the patient's chronic medical issues.  Patient has a h/o of HCV now s/p Harvoni treatment.  Last viral load was undetectable.  He reports 2-3 months worsening lower extremity "sore, aching, throbbing, tender" pain of his bilateral feet affecting bottom of foot from arch to toes. He occasionally has symptoms at the ankle and calf as well.  He endorses pins/needles and numbness/tingling.  He first noticed symptoms in 2007, but it was not previously this bad.  He was previously on Gabapentin 900 mg for radiculopathy, which was stopped in Sept 2016.  He states it had been giving him stomach upset.   He denies fever, chills, CP, SOB, N/V, C/D, leg swelling, or calf pain.  He endorses polyuria and polydipsia.    His DMII is poorly controlled at A1c of 8.5.  He drinks lots of juice and eats frosted flakes for breakfast.   Past Medical History  Diagnosis Date  . Diabetes mellitus 2007  . Neuromuscular disorder ([place])   . Pancreatitis 01/09/2012  . Hypertension goal BP (blood pressure) < 140/80   . Hepatitis C     [id number]   Current Outpatient Prescriptions  Medication Sig Dispense Refill  . baclofen (LIORESAL) 10 MG tablet Take 1 tablet (10 mg total) by mouth every 8 (eight) hours as needed for muscle spasms. 60 each 3  . Blood Glucose Monitoring Suppl (ACCU-CHEK AVIVA PLUS) W/DEVICE KIT 1 each by Does not apply route 2 (two) times daily. To check blood sugar twice daily. diag code E11.40. Non insulin dependent. Please check with pt that this is the equipment he needs. 1 kit 0  . celecoxib (CELEBREX) 200 MG capsule Take 1 capsule (200 mg total) by mouth daily. 30 capsule 2  .  clotrimazole-betamethasone (LOTRISONE) cream Apply 1 application topically 2 (two) times daily. 45 g 2  . fluticasone (FLONASE) 50 MCG/ACT nasal spray Place 2 sprays into both nostrils daily. 16 g 11  . glucose blood (ACCU-CHEK AVIVA PLUS) test strip To use twice daily to check blood sugar. diag code E11.40. Non insulin dependent 100 each 11  . HYDROcodone-acetaminophen (NORCO) 7.5-325 MG tablet Take 1 tablet by mouth every 6 (six) hours as needed for moderate pain. 30 tablet 0  . Lancet Devices MISC 1 each by Does not apply route 2 (two) times daily. Please use to check blood sugar 2 times daily. diag code E11.40. noninsulin dependent 100 each 11  . lisinopril (PRINIVIL,ZESTRIL) 20 MG tablet Take 1 tablet (20 mg total) by mouth daily. 30 tablet 11  . pioglitazone (ACTOS) 45 MG tablet Take 1 tablet (45 mg total) by mouth daily. 30 tablet 5  . simvastatin (ZOCOR) 40 MG tablet Take 1 tablet (40 mg total) by mouth every evening. 30 tablet 11   No current facility-administered medications for this visit.   Family History  Problem Relation Age of Onset  . Diabetes Mother   . Hypertension Mother   . Hyperlipidemia Mother   . Diabetes Brother   . Hypertension Brother   . Heart attack Neg Hx   . Sudden death Neg Hx    Social History  Social History  . Marital Status: Single    Spouse Name: N/A  . Number of Children: N/A  . Years of Education: N/A   Social History Main Topics  . Smoking status: Current Some Day Smoker -- 0.10 packs/day    Types: Cigarettes, Cigars    Last Attempt to Quit: 01/05/2012  . Smokeless tobacco: Never Used     Comment: 2 cigs/week  . Alcohol Use: 3.0 oz/week    5 Cans of beer per week  . Drug Use: No  . Sexual Activity: Yes    Birth Control/ Protection: None   Other Topics Concern  . Not on file   Social History Narrative   Prior incarceration. Tattoos, inc ones obtained in prison.    On disability since 09/2013   Review of Systems: Pertinent items  are noted in HPI. Objective:  Physical Exam: There were no vitals filed for this visit. Physical Exam  Constitutional: He is oriented to person, place, and time and well-developed, well-nourished, and in no distress. No distress.  HENT:  Head: Normocephalic and atraumatic.  Eyes: EOM are normal.  Cardiovascular: Normal rate, regular rhythm and normal heart sounds.   DP and PT pulses 1+ and symmetric.  Pulmonary/Chest: Effort normal and breath sounds normal. No stridor. No respiratory distress. He has no wheezes.  Neurological: He is alert and oriented to person, place, and time.  Monofilament testing 2/4 on bilateral feet.  Skin: Skin is warm and dry. No rash noted. He is not diaphoretic.     Assessment & Plan:   Patient and case were discussed with Dr. Evette Doffing.  Please refer to Problem Based charting for further documentation.

## 2016-01-06 NOTE — Telephone Encounter (Signed)
Last filled on 12/01/15 per pharmacy # 30 Pt seen in clinic today but doctor would not refill, wants the PCP to refill.

## 2016-01-06 NOTE — Patient Instructions (Signed)
1. Take Gabapentin 300 mg three times daily.  Gabapentin capsules or tablets What is this medicine? GABAPENTIN (GA ba pen tin) is used to control partial seizures in adults with epilepsy. It is also used to treat certain types of nerve pain. This medicine may be used for other purposes; ask your health care provider or pharmacist if you have questions. What should I tell my health care provider before I take this medicine? They need to know if you have any of these conditions: -kidney disease -suicidal thoughts, plans, or attempt; a previous suicide attempt by you or a family member -an unusual or allergic reaction to gabapentin, other medicines, foods, dyes, or preservatives -pregnant or trying to get pregnant -breast-feeding How should I use this medicine? Take this medicine by mouth with a glass of water. Follow the directions on the prescription label. You can take it with or without food. If it upsets your stomach, take it with food.Take your medicine at regular intervals. Do not take it more often than directed. Do not stop taking except on your doctor's advice. If you are directed to break the 600 or 800 mg tablets in half as part of your dose, the extra half tablet should be used for the next dose. If you have not used the extra half tablet within 28 days, it should be thrown away. A special MedGuide will be given to you by the pharmacist with each prescription and refill. Be sure to read this information carefully each time. Talk to your pediatrician regarding the use of this medicine in children. Special care may be needed. Overdosage: If you think you have taken too much of this medicine contact a poison control center or emergency room at once. NOTE: This medicine is only for you. Do not share this medicine with others. What if I miss a dose? If you miss a dose, take it as soon as you can. If it is almost time for your next dose, take only that dose. Do not take double or extra  doses. What may interact with this medicine? Do not take this medicine with any of the following medications: -other gabapentin products This medicine may also interact with the following medications: -alcohol -antacids -antihistamines for allergy, cough and cold -certain medicines for anxiety or sleep -certain medicines for depression or psychotic disturbances -homatropine; hydrocodone -naproxen -narcotic medicines (opiates) for pain -phenothiazines like chlorpromazine, mesoridazine, prochlorperazine, thioridazine This list may not describe all possible interactions. Give your health care provider a list of all the medicines, herbs, non-prescription drugs, or dietary supplements you use. Also tell them if you smoke, drink alcohol, or use illegal drugs. Some items may interact with your medicine. What should I watch for while using this medicine? Visit your doctor or health care professional for regular checks on your progress. You may want to keep a record at home of how you feel your condition is responding to treatment. You may want to share this information with your doctor or health care professional at each visit. You should contact your doctor or health care professional if your seizures get worse or if you have any new types of seizures. Do not stop taking this medicine or any of your seizure medicines unless instructed by your doctor or health care professional. Stopping your medicine suddenly can increase your seizures or their severity. Wear a medical identification bracelet or chain if you are taking this medicine for seizures, and carry a card that lists all your medications. You may get drowsy,  dizzy, or have blurred vision. Do not drive, use machinery, or do anything that needs mental alertness until you know how this medicine affects you. To reduce dizzy or fainting spells, do not sit or stand up quickly, especially if you are an older patient. Alcohol can increase drowsiness and  dizziness. Avoid alcoholic drinks. Your mouth may get dry. Chewing sugarless gum or sucking hard candy, and drinking plenty of water will help. The use of this medicine may increase the chance of suicidal thoughts or actions. Pay special attention to how you are responding while on this medicine. Any worsening of mood, or thoughts of suicide or dying should be reported to your health care professional right away. Women who become pregnant while using this medicine may enroll in the [place] Pregnancy Registry by calling [phone]. This registry collects information about the safety of antiepileptic drug use during pregnancy. What side effects may I notice from receiving this medicine? Side effects that you should report to your doctor or health care professional as soon as possible: -allergic reactions like skin rash, itching or hives, swelling of the face, lips, or tongue -worsening of mood, thoughts or actions of suicide or dying Side effects that usually do not require medical attention (report to your doctor or health care professional if they continue or are bothersome): -constipation -difficulty walking or controlling muscle movements -dizziness -nausea -slurred speech -tiredness -tremors -weight gain This list may not describe all possible side effects. Call your doctor for medical advice about side effects. You may report side effects to FDA at 1-800-FDA-1088. Where should I keep my medicine? Keep out of reach of children. This medicine may cause accidental overdose and death if it taken by other adults, children, or pets. Mix any unused medicine with a substance like cat litter or coffee grounds. Then throw the medicine away in a sealed container like a sealed bag or a coffee can with a lid. Do not use the medicine after the expiration date. Store at room temperature between 15 and 30 degrees C (59 and 86 degrees F). NOTE: This sheet is a summary. It may  not cover all possible information. If you have questions about this medicine, talk to your doctor, pharmacist, or health care provider.    2016, Elsevier/Gold Standard. (2014-01-29 15:26:50)

## 2016-01-06 NOTE — Assessment & Plan Note (Signed)
A/P: Diabetic neuropathy, previously controlled on Gabapentin. Will restart Gabapentin and refer to podiatry, as well as nutrition counseling.  Gabapentin may be titrated to effective dose.  - Gabapentin 300 mg TID - Podiatry - Nutrition consult - Recheck sx and A1c in 2 months.

## 2016-01-06 NOTE — Telephone Encounter (Signed)
Used 30 in about 35 days or so so use has increased this past month. UDS Feb OK and database OK. Will fill and track usage.

## 2016-01-06 NOTE — Telephone Encounter (Signed)
Pt informed Rx is ready 

## 2016-01-09 ENCOUNTER — Encounter: Payer: Self-pay | Admitting: Dietician

## 2016-01-09 ENCOUNTER — Other Ambulatory Visit: Payer: Self-pay | Admitting: Internal Medicine

## 2016-01-09 ENCOUNTER — Ambulatory Visit (INDEPENDENT_AMBULATORY_CARE_PROVIDER_SITE_OTHER): Payer: Commercial Managed Care - HMO | Admitting: Dietician

## 2016-01-09 VITALS — Ht 68.5 in | Wt 188.9 lb

## 2016-01-09 DIAGNOSIS — Z0000 Encounter for general adult medical examination without abnormal findings: Secondary | ICD-10-CM

## 2016-01-09 DIAGNOSIS — Z7984 Long term (current) use of oral hypoglycemic drugs: Secondary | ICD-10-CM

## 2016-01-09 DIAGNOSIS — E1140 Type 2 diabetes mellitus with diabetic neuropathy, unspecified: Secondary | ICD-10-CM

## 2016-01-09 DIAGNOSIS — Z713 Dietary counseling and surveillance: Secondary | ICD-10-CM | POA: Diagnosis not present

## 2016-01-09 DIAGNOSIS — E1165 Type 2 diabetes mellitus with hyperglycemia: Secondary | ICD-10-CM | POA: Diagnosis not present

## 2016-01-09 LAB — GLUCOSE, CAPILLARY: Glucose-Capillary: 243 mg/dL — ABNORMAL HIGH (ref 65–99)

## 2016-01-09 NOTE — Progress Notes (Signed)
Internal Medicine Clinic Attending  Case discussed with Dr. Taylor at the time of the visit.  We reviewed the resident's history and exam and pertinent patient test results.  I agree with the assessment, diagnosis, and plan of care documented in the resident's note. 

## 2016-01-09 NOTE — Patient Instructions (Addendum)
Plan to get sugar under control:  Check blood sugar every other day as per schedule/sheet provided  Try to eat something within 1 hour of waking   Spreading intake out over the day.  Basic Carbohydrate Counting for Diabetes Mellitus Carbohydrate counting is a method for keeping track of the amount of carbohydrates (starches & Sugars) you eat.   Eating carbohydrates naturally increases the level of sugar (glucose) in your blood, so it is important for you to know the amount that is okay for you to have in every meal. Carbohydrate counting helps keep the level of glucose in your blood within normal limits.  Carbohydrates are found in the following foods:  Grains, such as breads and cereals.  Dried beans and soy products.  Starchy vegetables, such as potatoes, peas, and corn.  Fruit and fruit juices.  Milk and yogurt.  Sweets and snack foods, such as cake, cookies, candy, chips, soft drinks, and fruit drinks. Marland Kitchen

## 2016-01-09 NOTE — Progress Notes (Signed)
Medical Nutrition Therapy:  Appt start time: 1030 end time:  1130. Visit # 1  last seen in 2013  Assessment:  Primary concerns today: glycemic control.  Patient presents today complaining that his feet are very painful and he wonders if the higher dose of actos is not working as well as the lower dose because his blood sugar is 243 today in the office. He is not able to identify the starches and sugars in his diet. He drinks regular soda and juice often and does most of his eating  in the evening. He does not report alcohol intake when asked. He plans to try spreading his intake out and then if that doesn't work ask for insulin or other medicine to address his high blood sugars as he remembers when he was given insulin before his feet stopped hurting right away.   Marland Kitchen His feet were examined today and look okay with no sores or redness, he has a corn on the left foot on the outside edge toward his heel that he says has been treated by a foot doctor in the past. He reports that an podiatrist referral is in the works. He reports that he smokes 1 pack of cigarettes a month and is not ready to quit. His Feet are high risk because of his smoking and he does have a referral to a podiatrist in the system.  Preferred Learning Style: No preference indicated  Learning Readiness: Contemplating  PHYSICAL ACTIVITY:- ADLs and walks from bus and back ANTHROPOMETRICS: weight-188.9#, height-he insists that it is 69" when he measured 67.5" with shoes on , BMI-28 or higher as his height was measured with shoes WEIGHT HISTORY:has gained 9# since starting actos in September, however his weight has fluctuated between 170s and 180s for past few years SLEEP:not addressed today MEDICATIONS: actos 45 mg /day and he reports adherence to this BLOOD SUGAR: 243 today after bologna sandwich this am with water and medicines. He did not bring his meter, his recent download had ~ 3 readings in 1 month  DIETARY INTAKE: Usual eating  pattern includes 2 meals and 2 snacks per day. Everyday foods include chicken wings, chips soda, meat, corn , potatoes, pasta  .  Avoided foods include none discussed today.   24-hr recall:  B ( AM): mostly skips L (4-6 PM): cube steak with gravy, 1 cup corn, 3 pieces of cornbread, turnip greens Snk ( PM): 1/2 large bag chips~2 servings) , water yesterday but often has soda or juice 10-20 ounces D ( PM): cube steak with gravy, 1 cup corn, 3 pieces of cornbread, turnip greens Snk ( PM): chips, water, but usually soda or juice Beverages: water last few days but often regular soda- sprite gingerale   Progress Towards Goal(s):  In progress.   Nutritional Diagnosis:  NB-1.1 Food and nutrition-related knowledge deficit As related to lack of sufficient prior diabetes training.  As evidenced by his inability to identify foods that increase blood sugar.    Intervention:  Nutrition education about starch and sugars (carbs) and their affect on blood sugar.We talked about options for improved control. He was i Coordination of care: may want PRN medication to assist when blood sugars are high- consider prandin or other oral agent combination pill with actos as insulin is not recommended to combine with actos.   Teaching Method Utilized: Visual, Auditory, Hands on Handouts given during visit include:AVS, what can I eat- beginner carb information sheet.  Barriers to learning/adherence to lifestyle change: willingness to  change diet and support Demonstrated degree of understanding via:  Teach Back   Monitoring/Evaluation:  Dietary intake, exercise, meter, and body weight in 3 week(s).

## 2016-01-10 ENCOUNTER — Encounter: Payer: Commercial Managed Care - HMO | Admitting: Physical Medicine & Rehabilitation

## 2016-01-11 ENCOUNTER — Telehealth: Payer: Self-pay | Admitting: *Deleted

## 2016-01-11 NOTE — Telephone Encounter (Signed)
CALLED PATIENT AND LEFT VOICE MESSAGE FOR PATIENT REGARDING HIS PODIATRY APPOINTMENT.

## 2016-01-12 DIAGNOSIS — M1990 Unspecified osteoarthritis, unspecified site: Secondary | ICD-10-CM | POA: Diagnosis not present

## 2016-01-12 DIAGNOSIS — G5790 Unspecified mononeuropathy of unspecified lower limb: Secondary | ICD-10-CM | POA: Diagnosis not present

## 2016-01-12 DIAGNOSIS — M19071 Primary osteoarthritis, right ankle and foot: Secondary | ICD-10-CM | POA: Diagnosis not present

## 2016-01-12 DIAGNOSIS — M659 Synovitis and tenosynovitis, unspecified: Secondary | ICD-10-CM | POA: Diagnosis not present

## 2016-01-12 DIAGNOSIS — M7752 Other enthesopathy of left foot: Secondary | ICD-10-CM | POA: Diagnosis not present

## 2016-01-12 DIAGNOSIS — M19072 Primary osteoarthritis, left ankle and foot: Secondary | ICD-10-CM | POA: Diagnosis not present

## 2016-01-12 DIAGNOSIS — M7751 Other enthesopathy of right foot: Secondary | ICD-10-CM | POA: Diagnosis not present

## 2016-01-16 ENCOUNTER — Encounter: Payer: Self-pay | Admitting: Physical Medicine & Rehabilitation

## 2016-01-16 ENCOUNTER — Encounter
Payer: Commercial Managed Care - HMO | Attending: Physical Medicine & Rehabilitation | Admitting: Physical Medicine & Rehabilitation

## 2016-01-16 ENCOUNTER — Other Ambulatory Visit: Payer: Self-pay | Admitting: Dietician

## 2016-01-16 VITALS — BP 137/96 | HR 99

## 2016-01-16 DIAGNOSIS — M47816 Spondylosis without myelopathy or radiculopathy, lumbar region: Secondary | ICD-10-CM

## 2016-01-16 DIAGNOSIS — E1142 Type 2 diabetes mellitus with diabetic polyneuropathy: Secondary | ICD-10-CM | POA: Diagnosis not present

## 2016-01-16 DIAGNOSIS — B1920 Unspecified viral hepatitis C without hepatic coma: Secondary | ICD-10-CM | POA: Diagnosis not present

## 2016-01-16 DIAGNOSIS — Z833 Family history of diabetes mellitus: Secondary | ICD-10-CM | POA: Insufficient documentation

## 2016-01-16 DIAGNOSIS — K859 Acute pancreatitis without necrosis or infection, unspecified: Secondary | ICD-10-CM | POA: Diagnosis not present

## 2016-01-16 DIAGNOSIS — M545 Low back pain: Secondary | ICD-10-CM | POA: Diagnosis not present

## 2016-01-16 DIAGNOSIS — G8929 Other chronic pain: Secondary | ICD-10-CM | POA: Insufficient documentation

## 2016-01-16 DIAGNOSIS — E1140 Type 2 diabetes mellitus with diabetic neuropathy, unspecified: Secondary | ICD-10-CM

## 2016-01-16 DIAGNOSIS — I10 Essential (primary) hypertension: Secondary | ICD-10-CM | POA: Insufficient documentation

## 2016-01-16 DIAGNOSIS — F1010 Alcohol abuse, uncomplicated: Secondary | ICD-10-CM | POA: Diagnosis not present

## 2016-01-16 DIAGNOSIS — R202 Paresthesia of skin: Secondary | ICD-10-CM | POA: Insufficient documentation

## 2016-01-16 DIAGNOSIS — M79606 Pain in leg, unspecified: Secondary | ICD-10-CM | POA: Diagnosis not present

## 2016-01-16 DIAGNOSIS — Z8249 Family history of ischemic heart disease and other diseases of the circulatory system: Secondary | ICD-10-CM | POA: Diagnosis not present

## 2016-01-16 DIAGNOSIS — M549 Dorsalgia, unspecified: Secondary | ICD-10-CM | POA: Diagnosis present

## 2016-01-16 DIAGNOSIS — G709 Myoneural disorder, unspecified: Secondary | ICD-10-CM | POA: Diagnosis not present

## 2016-01-16 DIAGNOSIS — F17210 Nicotine dependence, cigarettes, uncomplicated: Secondary | ICD-10-CM | POA: Insufficient documentation

## 2016-01-16 DIAGNOSIS — E1144 Type 2 diabetes mellitus with diabetic amyotrophy: Secondary | ICD-10-CM | POA: Diagnosis not present

## 2016-01-16 MED ORDER — GLUCOSE BLOOD VI STRP: ORAL_STRIP | Status: DC

## 2016-01-16 NOTE — Progress Notes (Signed)
Subjective:    [name], male    DOB: March 10, 1961, 55 y.o.   MRN: [id number]  HPI   Mr. Pesicka is here in follow up of his chronic back and leg pain. He tells me he aggravated his back in the Fall. I saw an ED visit at that time. He was given an toradol injection and instructions for his back. he remains limited with some activities due to his back---particularly ones which require long standing or walking. He has also been restarted on gabapentin for his leg symptoms after having stopped the medicine last year---he is taking 300mg  TID currently.   He is seeing a podiatrist for his feet. Tells me he received injections to his feet as well. He states that his feet/legs tend to hurt more with the colder weather.   He continues to work on his diabetes mgt but seems to be struggling still with dietary choices, etc. His primary team has been aggressive working with him in this area.     Pain Inventory Average Pain 7 Pain Right Now 7 My pain is sharp, burning, stabbing, tingling and aching  In the last 24 hours, has pain interfered with the following? General activity 7 Relation with others 7 Enjoyment of life 10 What TIME of day is your pain at its worst? night Sleep (in general) Fair  Pain is worse with: bending, standing and some activites Pain improves with: injections Relief from Meds: 5  Mobility walk without assistance how many minutes can you walk? 45 ability to climb steps?  no do you drive?  no Do you have any goals in this area?  yes  Function not employed: date last employed . Do you have any goals in this area?  yes  Neuro/Psych numbness tingling trouble walking spasms  Prior Studies Any changes since last visit?  yes x-rays  Physicians involved in your care Any changes since last visit?  no   Family History  Problem Relation Age of Onset  . Diabetes Mother   . Hypertension Mother   . Hyperlipidemia Mother   . Diabetes Brother   .  Hypertension Brother   . Heart attack Neg Hx   . Sudden death Neg Hx    Social History   Social History  . Marital Status: Single    Spouse Name: N/A  . Number of Children: N/A  . Years of Education: N/A   Social History Main Topics  . Smoking status: Current Some Day Smoker -- 0.10 packs/day    Types: Cigarettes, Cigars    Last Attempt to Quit: 01/05/2012  . Smokeless tobacco: Never Used     Comment: 2 cigs/week  . Alcohol Use: 3.0 oz/week    5 Cans of beer per week  . Drug Use: No  . Sexual Activity: Yes    Birth Control/ Protection: None   Other Topics Concern  . None   Social History Narrative   Prior incarceration. Tattoos, inc ones obtained in prison.    On disability since 09/2013   History reviewed. No pertinent past surgical history. Past Medical History  Diagnosis Date  . Diabetes mellitus 2007  . Neuromuscular disorder ([place])   . Pancreatitis 01/09/2012  . Hypertension goal BP (blood pressure) < 140/80   . Hepatitis C     [id number]   BP 137/96 mmHg  Pulse 99  SpO2 98%  Opioid Risk Score:   Fall Risk Score:  `1  Depression screen PHQ 2/9  Depression screen  [facility] 2/9 01/06/2016 12/01/2015 09/08/2015 07/11/2015 03/30/2015 03/28/2015 03/17/2015  Decreased Interest 0 0 0 0 0 0 0  Down, Depressed, Hopeless 0 0 0 0 0 0 1  PHQ - 2 Score 0 0 0 0 0 0 1  Altered sleeping - - - - 0 - -  Tired, decreased energy - - - - 0 - -  Change in appetite - - - - 0 - -  Feeling bad or failure about yourself  - - - - 0 - -  Trouble concentrating - - - - 0 - -  Moving slowly or fidgety/restless - - - - 0 - -  Suicidal thoughts - - - - 0 - -  PHQ-9 Score - - - - 0 - -     Review of Systems  Constitutional: Positive for unexpected weight change.  Endocrine:       High blood sugar Low blood sugar   All other systems reviewed and are negative.      Objective:   Physical Exam  General: Alert and oriented x 3, No apparent distress. Cooperative with exam but needed to  be re-directed numerous times to stay on task.  HEENT: Head is normocephalic, atraumatic, PERRLA, EOMI, sclera anicteric, oral mucosa pink and moist, dentition intact, ext ear canals clear,  Neck: Supple without JVD or lymphadenopathy  Heart: Reg rate and rhythm. No murmurs rubs or gallops  Chest: CTA bilaterally without wheezes, rales, or rhonchi; no distress  Abdomen: Soft, non-tender, non-distended, bowel sounds positive.  Extremities: No clubbing, cyanosis, or edema. Pulses are 2+  Skin: Clean and intact without signs of breakdown  Neuro: Pt is cognitively appropriate. Cranial nerves 2-12 are intact. Sensory exam remains decreased in a stocking glove distribution in both legs below the knees as well as the finger tips. He has some sensory loss along the medial right knee.. Reflexes are 2+ in all 4's. Fine motor coordination is intact. No tremors. Motor function is grossly 5/5 except for pain inhibition in the right leg.  Musculoskeletal: He is limited in lumbar flexion to about 70 degrees. Facet maneuvers were equivocal to positive on the right.  Muscle spasms Right lumbar paraspinals. SLR was equiovcal, FABER was negative.  Gait appeared generally normal. No gross spinal or pelvic symmetry abnl.  Psych: Pt's affect is appropriate. Pt is cooperative, a little high strung.   Assessment & Plan:   1. Chronic low back pain. 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 Relatively normal lumbar MRI from 2013  4. Diabetic polyneuropathy   Plan:  1. Hydrocodone prn per pcp (using rarely)  2. Reviewed the importance of better glycemic control. 3. Facet stretches/exercises were provided today. Needs to perform these routinely. 4. His UDS was positive for THC---non-narcotic mgt only here.  5. Gabapentin resumed at 300mg  TID --this is helping 6. Follow up with me in about 6 months. 15 minutes of face to face patient care time were  spent during this visit. All questions were encouraged and answered.

## 2016-01-16 NOTE — Telephone Encounter (Signed)
Pt calls saying his test strip will cost him 156.00 for the accuchek can he have the freestyle because he never had to pay to for them? CDE asked him to be sure his pharmacy has a copy of all of his insurance cards and suggested he find out why the strips are costing him so much. It could be his yearly deductible or it could be they do not have a copy of his insurance cards.   Patient verbalized understanding saying he'd take his insurance cards to his pharmacy, he also said that his blood sugars have been in the 90's and low 100s since our visit and he is alternating the time of day he is checking. He requests a prescription for freestyle lite test strips

## 2016-01-19 DIAGNOSIS — M2141 Flat foot [pes planus] (acquired), right foot: Secondary | ICD-10-CM | POA: Diagnosis not present

## 2016-01-19 DIAGNOSIS — M659 Synovitis and tenosynovitis, unspecified: Secondary | ICD-10-CM | POA: Diagnosis not present

## 2016-01-19 DIAGNOSIS — M2142 Flat foot [pes planus] (acquired), left foot: Secondary | ICD-10-CM | POA: Diagnosis not present

## 2016-01-19 DIAGNOSIS — M7752 Other enthesopathy of left foot: Secondary | ICD-10-CM | POA: Diagnosis not present

## 2016-01-19 DIAGNOSIS — M7751 Other enthesopathy of right foot: Secondary | ICD-10-CM | POA: Diagnosis not present

## 2016-01-26 MED FILL — HYDROCODON-APAP 5-325: 5-325 | 4 days supply | Qty: 20 | Fill #0

## 2016-01-27 ENCOUNTER — Telehealth: Payer: Self-pay | Admitting: Internal Medicine

## 2016-01-27 NOTE — Telephone Encounter (Signed)
APPT REMINDER CALL/LMTCB IF HE NEEDS TO CANCEL

## 2016-01-30 ENCOUNTER — Telehealth: Payer: Self-pay | Admitting: Internal Medicine

## 2016-01-30 ENCOUNTER — Ambulatory Visit (INDEPENDENT_AMBULATORY_CARE_PROVIDER_SITE_OTHER): Payer: Commercial Managed Care - HMO | Admitting: Dietician

## 2016-01-30 VITALS — Wt 186.5 lb

## 2016-01-30 DIAGNOSIS — Z7984 Long term (current) use of oral hypoglycemic drugs: Secondary | ICD-10-CM

## 2016-01-30 DIAGNOSIS — Z713 Dietary counseling and surveillance: Secondary | ICD-10-CM | POA: Diagnosis not present

## 2016-01-30 DIAGNOSIS — E119 Type 2 diabetes mellitus without complications: Secondary | ICD-10-CM

## 2016-01-30 DIAGNOSIS — Z0000 Encounter for general adult medical examination without abnormal findings: Secondary | ICD-10-CM

## 2016-01-30 DIAGNOSIS — E1140 Type 2 diabetes mellitus with diabetic neuropathy, unspecified: Secondary | ICD-10-CM

## 2016-01-30 NOTE — Progress Notes (Signed)
  Medical Nutrition Therapy:  Appt start time: [id number] end time:  1045. Visit # 2  last seen in 12/2015  Assessment:  Primary concerns today: glycemic control.  Patient presents today with his meter and sheet of paper to guide self monitoring at home. He does not want to do this long term but is willing to consider a weekly profile. He reports the change in her blood sugars is due to changing his food intake to smaller portions of starchy foods and is eating more often.  He did not mention his feet today.  He is not interested in using a smartphone ap to help him to remember to take his zocor and actos.      He asked about his cholesterol and which pill was for that. He reports not adherenece to both actos and zocor. He didn't know what the zocor was for, and he intentionally skips the actos when his blood sugar is lower that he likes it to be.   Learning Readiness: Contemplating/ready/change in progress  PHYSICAL ACTIVITY:- ADLs and walks, not motivated to change ANTHROPOMETRICS: weight-186.5# , BMI-28.8 SLEEP:not addressed today MEDICATIONS: actos 45 mg /day and he reports adherence to this except when blood sugars are in the 80s and 90s. He prefers them to be ~130.   BLOOD SUGAR: 108 fasting today, meter shows average of 116 with self monitoring throughout the day, decreased significantly, average for past 6 months is 154.  DIETARY INTAKE: smaller portions of starches, eating more often, thinks he uses canola oil and tries to eat fish more often and almost no fried foods   Progress Towards Goal(s):  In progress.   Nutritional Diagnosis:  NB-1.1 Food and nutrition-related knowledge deficit As related to lack of sufficient prior diabetes training is improving .  As evidenced by his improved ability to identify foods that increase blood sugar and improved blood sugars.    Intervention:  Nutrition education about self monitoring and support to help him continue  his new lifestyle behaviors that are  helping to decrease his health risk.  Coordination of care: monitor adherence to actos.   Teaching Method Utilized: Visual, Auditory, Hands on Handouts given during visit include:AVS, meter download.  Barriers to learning/adherence to lifestyle change: willingness to change diet and support Demonstrated degree of understanding via:  Teach Back   Monitoring/Evaluation:  Dietary intake, exercise, meter, and body weight in 8 week(s).

## 2016-01-30 NOTE — Telephone Encounter (Signed)
Patient needing a [employer] Ref  to be seen @ [facility] for his Diabetes.

## 2016-01-30 NOTE — Patient Instructions (Signed)
Keep eating smaller portions of food more often because it is hlepin gyour blood sugars.  You are doing great controlling your blood sugar.   You can check your blood sugars one day a week before meals and bedtime- 3 x/day  Take your cholesterol medicine  Take your Actos every day, consider talking to Dr. Lynnae Palmer about a lower dose if you feel you cannot take this everyday.  Please make a follow up in 8 weeks- April 2017.

## 2016-02-08 ENCOUNTER — Other Ambulatory Visit: Payer: Self-pay | Admitting: Internal Medicine

## 2016-02-08 DIAGNOSIS — G8929 Other chronic pain: Secondary | ICD-10-CM

## 2016-02-08 DIAGNOSIS — F1120 Opioid dependence, uncomplicated: Secondary | ICD-10-CM

## 2016-02-08 DIAGNOSIS — M545 Low back pain: Principal | ICD-10-CM

## 2016-02-08 NOTE — Telephone Encounter (Signed)
He says he will wait until he sees you on the 16th, Dr. Lynnae January.  Says he has "about 3 or 4 more and will wait unless he needs [place] sooner."

## 2016-02-08 NOTE — Telephone Encounter (Signed)
Denied.   He got #20 hydrocodone 5 mg from a Dr Otilio Connors at [place] on 01/26/16. That is addition to  1/20 #30 from me 12/15 #30 from me  His use has increased, Dr Tasia Catchings isn't in [facility] so I do not know those circumstances. Saw Dr Lovena Le in Jan for radiculopathy. If it is so uncontrolled, needs appt to discuss.

## 2016-02-08 NOTE — Telephone Encounter (Signed)
Patient requesting a refill on his Hydrocodone.

## 2016-02-08 NOTE — Assessment & Plan Note (Signed)
RF denied.   He got #20 hydrocodone 5 mg from a Dr Otilio Connors at [facility] on 01/26/16. That is addition to  1/20 #30 from me 12/15 #30 from me  His use has increased, Dr Tasia Catchings isn't in [facility] so I do not know those circumstances. Saw Dr Lovena Le in Jan for radiculopathy. If it is so uncontrolled, needs appt to discuss.

## 2016-02-08 NOTE — Telephone Encounter (Signed)
Last refill 1/20  OV 1/20 UDS 02/08/15

## 2016-02-10 ENCOUNTER — Other Ambulatory Visit: Payer: Self-pay | Admitting: *Deleted

## 2016-02-10 DIAGNOSIS — E1140 Type 2 diabetes mellitus with diabetic neuropathy, unspecified: Secondary | ICD-10-CM

## 2016-02-10 MED ORDER — GLUCOSE BLOOD VI STRP: ORAL_STRIP | Status: DC

## 2016-02-10 NOTE — Telephone Encounter (Signed)
Dr Software engineer, pharmacy called, someone has stolen pt's information and ordered testing strips in his name, pharmacy has investigated and will need a new script for insurance to pay, pharmacist states hopefully pt will back to normal state of testing in April, they will keep [place] informed, it is [facility]

## 2016-02-10 NOTE — Telephone Encounter (Signed)
6 month supply sent 01/16/16 to [facility]-

## 2016-02-10 NOTE — Addendum Note (Signed)
Addended by: Larey Dresser A on: 02/10/2016 04:39 PM   Modules accepted: Orders

## 2016-02-13 DIAGNOSIS — E119 Type 2 diabetes mellitus without complications: Secondary | ICD-10-CM | POA: Diagnosis not present

## 2016-02-13 DIAGNOSIS — H11001 Unspecified pterygium of right eye: Secondary | ICD-10-CM | POA: Diagnosis not present

## 2016-02-13 DIAGNOSIS — H2513 Age-related nuclear cataract, bilateral: Secondary | ICD-10-CM | POA: Diagnosis not present

## 2016-02-13 DIAGNOSIS — H00029 Hordeolum internum unspecified eye, unspecified eyelid: Secondary | ICD-10-CM | POA: Diagnosis not present

## 2016-02-13 DIAGNOSIS — H40013 Open angle with borderline findings, low risk, bilateral: Secondary | ICD-10-CM | POA: Diagnosis not present

## 2016-02-13 LAB — HM DIABETES EYE EXAM

## 2016-02-24 ENCOUNTER — Encounter: Payer: Self-pay | Admitting: *Deleted

## 2016-02-27 DIAGNOSIS — H00029 Hordeolum internum unspecified eye, unspecified eyelid: Secondary | ICD-10-CM | POA: Diagnosis not present

## 2016-02-27 DIAGNOSIS — E119 Type 2 diabetes mellitus without complications: Secondary | ICD-10-CM | POA: Diagnosis not present

## 2016-02-27 DIAGNOSIS — H2513 Age-related nuclear cataract, bilateral: Secondary | ICD-10-CM | POA: Diagnosis not present

## 2016-02-27 DIAGNOSIS — H11001 Unspecified pterygium of right eye: Secondary | ICD-10-CM | POA: Diagnosis not present

## 2016-02-27 DIAGNOSIS — H40013 Open angle with borderline findings, low risk, bilateral: Secondary | ICD-10-CM | POA: Diagnosis not present

## 2016-03-01 ENCOUNTER — Ambulatory Visit (INDEPENDENT_AMBULATORY_CARE_PROVIDER_SITE_OTHER): Payer: Commercial Managed Care - HMO | Admitting: Internal Medicine

## 2016-03-01 ENCOUNTER — Encounter: Payer: Self-pay | Admitting: Internal Medicine

## 2016-03-01 VITALS — BP 124/86 | HR 85 | Temp 98.0°F | Wt 184.5 lb

## 2016-03-01 DIAGNOSIS — Z7984 Long term (current) use of oral hypoglycemic drugs: Secondary | ICD-10-CM

## 2016-03-01 DIAGNOSIS — E1169 Type 2 diabetes mellitus with other specified complication: Secondary | ICD-10-CM

## 2016-03-01 DIAGNOSIS — B182 Chronic viral hepatitis C: Secondary | ICD-10-CM

## 2016-03-01 DIAGNOSIS — N521 Erectile dysfunction due to diseases classified elsewhere: Secondary | ICD-10-CM | POA: Diagnosis not present

## 2016-03-01 DIAGNOSIS — E1140 Type 2 diabetes mellitus with diabetic neuropathy, unspecified: Secondary | ICD-10-CM

## 2016-03-01 DIAGNOSIS — Z79899 Other long term (current) drug therapy: Secondary | ICD-10-CM

## 2016-03-01 DIAGNOSIS — F1120 Opioid dependence, uncomplicated: Secondary | ICD-10-CM

## 2016-03-01 DIAGNOSIS — E1165 Type 2 diabetes mellitus with hyperglycemia: Secondary | ICD-10-CM | POA: Diagnosis not present

## 2016-03-01 DIAGNOSIS — E784 Other hyperlipidemia: Secondary | ICD-10-CM

## 2016-03-01 DIAGNOSIS — I10 Essential (primary) hypertension: Secondary | ICD-10-CM

## 2016-03-01 DIAGNOSIS — K219 Gastro-esophageal reflux disease without esophagitis: Secondary | ICD-10-CM

## 2016-03-01 DIAGNOSIS — I700 Atherosclerosis of aorta: Secondary | ICD-10-CM

## 2016-03-01 DIAGNOSIS — E785 Hyperlipidemia, unspecified: Secondary | ICD-10-CM

## 2016-03-01 LAB — POCT GLYCOSYLATED HEMOGLOBIN (HGB A1C): Hemoglobin A1C: 9.2

## 2016-03-01 LAB — GLUCOSE, CAPILLARY: GLUCOSE-CAPILLARY: 153 mg/dL — AB (ref 65–99)

## 2016-03-01 MED ORDER — CELECOXIB 200 MG PO CAPS: 200.0000 mg | ORAL_CAPSULE | Freq: Every day | ORAL | Status: AC

## 2016-03-01 MED ORDER — PIOGLITAZONE HCL 45 MG PO TABS: 45.0000 mg | ORAL_TABLET | Freq: Every day | ORAL | Status: DC

## 2016-03-01 NOTE — Assessment & Plan Note (Signed)
He has finished Harvoni and has appt in May for labs for SVR.  PLAN : [facility] appt

## 2016-03-01 NOTE — Assessment & Plan Note (Signed)
Requested RF hydrocodone. Has gone 16 days without any. His usage had increased over the winter. #30 from me on 12/15. #30 from me on 1/20. And got 20 from apparently the dentist on 2/19. None since then.  On celebrex QD and baclofen QD. On gabapentin 300 TID but only taking prn bc he thinks it upsets his stomach. 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 I am not concerned with misuse or abuse but if able to work without hydrocodone I see no reason to cont it. I explained this to him.  PLAN : Cont celebrex, baclofen, and gaba.

## 2016-03-01 NOTE — Assessment & Plan Note (Signed)
A1C has deteriorated while on Actos 45 from 8.5 - 9.2 today. CBG's range from 130's to 190's. He doesn't have a great understanding of diet needs in DM. He drank 2 2L of soda in one week. He doesn't understand that breads / carbs increase his CBG. HE likes rice. Thought potatoes were OK since he boiled them instead of frying. I wrote out very basic diet advice on the AVS and Butch Penny is also working with him. Of concern is that he likes it when his CBG is > 140 bc he feels woozy when it is 120. He preferred to work on diet rather than adding a second med.  I emhasized risks of hyperglycemia, stressing ED which is already bothering him.  PLAN : Work with Butch Penny RTC 3 months Likely will need second agent.

## 2016-03-01 NOTE — Assessment & Plan Note (Signed)
BP Readings from Last 3 Encounters:  03/01/16 124/86  01/16/16 137/96  01/06/16 141/87   BP well controlled on lisinopril 20.   PLAN:  Cont current meds

## 2016-03-01 NOTE — Assessment & Plan Note (Signed)
Seen on CT 2015. Asymptomatic to the best of my ability to interview him as I find it difficult to get great detail on pain sxs. Would consider ABI in future if indicated.  PLAN : RF mgmt

## 2016-03-01 NOTE — Assessment & Plan Note (Signed)
Repeat BP great today. On lisinopril 20. No SE  PLAN:  Cont current meds

## 2016-03-01 NOTE — Assessment & Plan Note (Signed)
He reports intermittent ED, freq assoc with specific partner. Has tried cialis and viagra from friends and did nothing. Now has ring and is helping. I explained assoc btw poorly controlled DM and ED.  PLAN : improve DM control

## 2016-03-01 NOTE — Assessment & Plan Note (Signed)
Back on PPI now off harvoni. Sxs OK  PLAN:  Cont current meds

## 2016-03-01 NOTE — Progress Notes (Signed)
   Subjective:    [name], male    DOB: December 20, 1960, 55 y.o.   MRN: [id number]  HPI  Donald Palmer is here for HTN F/U. Please see the A&P for the status of the pt's chronic medical problems.  ROS : per ROS section and in problem oriented charting. All other systems are negative. 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  Gastrointestinal:       Thinks the gaba messing up stomach  Endocrine:       Feels bad when sugar less than 140 + ED off and on  Musculoskeletal:       Foot pain resolved. Calf numbness B       Objective:   Physical Exam  Constitutional: He is oriented to person, place, and time. He appears well-developed and well-nourished. No distress.  HENT:  Head: Normocephalic and atraumatic.  Right Ear: External ear normal.  Left Ear: External ear normal.  Nose: Nose normal.  Eyes: EOM are normal.  Neurological: He is alert and oriented to person, place, and time.  Skin: He is not diaphoretic.  Multiple tattoos  Psychiatric:  Pressured speech. "Hyper". Confuses meds, A1C test. Needs repeated focusing and teaching           Assessment & Plan:

## 2016-03-01 NOTE — Patient Instructions (Signed)
1. You have three months to slowly work on lowering your sugar. 2. Eat very little Bread, buns, toast, rolls Cookies, cakes, candy, snack cakes, honey buns Potatoes, corn Rice Soda, soft drinks, sweat tea, fruit juice Breakfast cerals  Eat a lot of vegetables Meat (not breaded and fried) Dairy   3. See Dr Linus Salmons in April or May for blood work 4. Good luck on the new job!

## 2016-03-19 ENCOUNTER — Other Ambulatory Visit: Payer: Self-pay | Admitting: Internal Medicine

## 2016-03-26 ENCOUNTER — Encounter (HOSPITAL_COMMUNITY)
Admission: EM | Disposition: A | Discharge status: Other Acute Inpatient Hospital | Payer: Self-pay | Source: Home / Self Care | Attending: Emergency Medicine

## 2016-03-26 ENCOUNTER — Emergency Department (HOSPITAL_COMMUNITY)
Admission: EM | Admit: 2016-03-26 | Discharge: 2016-03-26 | Disposition: A | Discharge status: Other Acute Inpatient Hospital | Payer: Commercial Managed Care - HMO | Attending: Emergency Medicine | Admitting: Emergency Medicine

## 2016-03-26 ENCOUNTER — Emergency Department (HOSPITAL_COMMUNITY): Payer: Commercial Managed Care - HMO

## 2016-03-26 ENCOUNTER — Encounter (HOSPITAL_COMMUNITY): Payer: Self-pay | Admitting: Emergency Medicine

## 2016-03-26 DIAGNOSIS — Z79899 Other long term (current) drug therapy: Secondary | ICD-10-CM | POA: Insufficient documentation

## 2016-03-26 DIAGNOSIS — F17210 Nicotine dependence, cigarettes, uncomplicated: Secondary | ICD-10-CM | POA: Diagnosis not present

## 2016-03-26 DIAGNOSIS — E119 Type 2 diabetes mellitus without complications: Secondary | ICD-10-CM | POA: Insufficient documentation

## 2016-03-26 DIAGNOSIS — Y93H2 Activity, gardening and landscaping: Secondary | ICD-10-CM | POA: Diagnosis not present

## 2016-03-26 DIAGNOSIS — S62620B Displaced fracture of medial phalanx of right index finger, initial encounter for open fracture: Secondary | ICD-10-CM | POA: Insufficient documentation

## 2016-03-26 DIAGNOSIS — S61210A Laceration without foreign body of right index finger without damage to nail, initial encounter: Secondary | ICD-10-CM | POA: Diagnosis not present

## 2016-03-26 DIAGNOSIS — E785 Hyperlipidemia, unspecified: Secondary | ICD-10-CM | POA: Diagnosis not present

## 2016-03-26 DIAGNOSIS — S6991XA Unspecified injury of right wrist, hand and finger(s), initial encounter: Secondary | ICD-10-CM | POA: Diagnosis not present

## 2016-03-26 DIAGNOSIS — S56421A Laceration of extensor muscle, fascia and tendon of right index finger at forearm level, initial encounter: Secondary | ICD-10-CM | POA: Diagnosis not present

## 2016-03-26 DIAGNOSIS — I10 Essential (primary) hypertension: Secondary | ICD-10-CM | POA: Insufficient documentation

## 2016-03-26 DIAGNOSIS — S56121A Laceration of flexor muscle, fascia and tendon of right index finger at forearm level, initial encounter: Secondary | ICD-10-CM | POA: Diagnosis not present

## 2016-03-26 DIAGNOSIS — W268XXA Contact with other sharp object(s), not elsewhere classified, initial encounter: Secondary | ICD-10-CM | POA: Insufficient documentation

## 2016-03-26 DIAGNOSIS — S62600B Fracture of unspecified phalanx of right index finger, initial encounter for open fracture: Secondary | ICD-10-CM

## 2016-03-26 DIAGNOSIS — Y929 Unspecified place or not applicable: Secondary | ICD-10-CM | POA: Diagnosis not present

## 2016-03-26 DIAGNOSIS — Y990 Civilian activity done for income or pay: Secondary | ICD-10-CM | POA: Diagnosis not present

## 2016-03-26 LAB — CBC WITH DIFFERENTIAL/PLATELET
BASOS PCT: 1 %
Basophils Absolute: 0.1 10*3/uL (ref 0.0–0.1)
EOS ABS: 0.1 10*3/uL (ref 0.0–0.7)
Eosinophils Relative: 1 %
HCT: 36.8 % — ABNORMAL LOW (ref 39.0–52.0)
Hemoglobin: 11.9 g/dL — ABNORMAL LOW (ref 13.0–17.0)
Lymphocytes Relative: 46 %
Lymphs Abs: 2.9 10*3/uL (ref 0.7–4.0)
MCH: 23.4 pg — AB (ref 26.0–34.0)
MCHC: 32.3 g/dL (ref 30.0–36.0)
MCV: 72.4 fL — ABNORMAL LOW (ref 78.0–100.0)
MONO ABS: 0.5 10*3/uL (ref 0.1–1.0)
Monocytes Relative: 7 %
NEUTROS PCT: 45 %
Neutro Abs: 2.9 10*3/uL (ref 1.7–7.7)
PLATELETS: 205 10*3/uL (ref 150–400)
RBC: 5.08 MIL/uL (ref 4.22–5.81)
RDW: 14.3 % (ref 11.5–15.5)
WBC: 6.5 10*3/uL (ref 4.0–10.5)

## 2016-03-26 LAB — BASIC METABOLIC PANEL
Anion gap: 7 (ref 5–15)
BUN: 15 mg/dL (ref 6–20)
CALCIUM: 9.2 mg/dL (ref 8.9–10.3)
CO2: 25 mmol/L (ref 22–32)
CREATININE: 0.96 mg/dL (ref 0.61–1.24)
Chloride: 105 mmol/L (ref 101–111)
Glucose, Bld: 140 mg/dL — ABNORMAL HIGH (ref 65–99)
Potassium: 3.9 mmol/L (ref 3.5–5.1)
SODIUM: 137 mmol/L (ref 135–145)

## 2016-03-26 SURGERY — NERVE, TENDON AND ARTERY REPAIR
Anesthesia: General | Laterality: Right

## 2016-03-26 MED ORDER — CEFAZOLIN SODIUM 1-5 GM-% IV SOLN
1.0000 g | Freq: Once | INTRAVENOUS | Status: AC
  Administered 2016-03-26: 1 g via INTRAVENOUS
  Filled 2016-03-26: qty 50

## 2016-03-26 MED ORDER — OXYCODONE-ACETAMINOPHEN 5-325 MG PO TABS
2.0000 | ORAL_TABLET | ORAL | Status: DC | PRN
  Administered 2016-03-26: 2 via ORAL
  Filled 2016-03-26: qty 2

## 2016-03-26 MED ORDER — LIDOCAINE HCL 2 % IJ SOLN
10.0000 mL | Freq: Once | INTRAMUSCULAR | Status: AC
  Administered 2016-03-26: 200 mg
  Filled 2016-03-26: qty 20

## 2016-03-26 MED ORDER — OXYCODONE-ACETAMINOPHEN 5-325 MG PO TABS: 1.0000 | ORAL_TABLET | ORAL | Status: DC | PRN

## 2016-03-26 MED ORDER — OXYCODONE-ACETAMINOPHEN 5-325 MG PO TABS
1.0000 | ORAL_TABLET | Freq: Once | ORAL | Status: AC
  Administered 2016-03-26: 1 via ORAL
  Filled 2016-03-26: qty 1

## 2016-03-26 MED ORDER — BUPIVACAINE HCL 0.5 % IJ SOLN
20.0000 mL | Freq: Once | INTRAMUSCULAR | Status: DC
  Filled 2016-03-26: qty 20

## 2016-03-26 MED ORDER — AMOXICILLIN-POT CLAVULANATE 875-125 MG PO TABS: 1.0000 | ORAL_TABLET | Freq: Two times a day (BID) | ORAL | Status: DC

## 2016-03-26 NOTE — ED Notes (Signed)
Bed: [facility]4 Expected date:  Expected time:  Means of arrival:  Comments: 

## 2016-03-26 NOTE — ED Provider Notes (Signed)
CSN: [id number]     Arrival date & time 03/26/16  1329 History  By signing my name below, I, Placido Sou, attest that this documentation has been prepared under the direction and in the presence of Ondrea Dow Y Iosefa Weintraub, [place]. Electronically Signed: Placido Sou, ED Scribe. 03/26/2016. 2:03 PM.   Chief Complaint  Patient presents with  . Partial Amputation   . Finger Injury   The history is provided by the patient. No language interpreter was used.    HPI Comments: DAVED MCFANN is a 55 y.o. male with history of Hep C, HTN, DM, HLD who presents to the Emergency Department complaining of a laceration with controlled bleeding to his right index finger which occurred PTA. 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 He reports associated, moderate, pain that radiates throughout his hand and a DROM and decreased sensation of the affected finger. Pt is unsure of the timing of his last TDAP booster. EMR review reveals he is up to date, however. He denies being on any blood thinning medications. Pt denies any other associated symptoms at this time.    Past Medical History  Diagnosis Date  . Diabetes mellitus 2007  . Neuromuscular disorder (HCC)   . Pancreatitis 01/09/2012  . Hypertension goal BP (blood pressure) < 140/80   . Hepatitis C     [id number]   History reviewed. No pertinent past surgical history. Family History  Problem Relation Age of Onset  . Diabetes Mother   . Hypertension Mother   . Hyperlipidemia Mother   . Diabetes Brother   . Hypertension Brother   . Heart attack Neg Hx   . Sudden death Neg Hx    Social History  Substance Use Topics  . Smoking status: Current Some Day Smoker -- 0.10 packs/day    Types: Cigarettes, Cigars    Last Attempt to Quit: 01/05/2012  . Smokeless tobacco: Never Used     Comment: 2 cigs/week  . Alcohol Use: 3.0 oz/week    5 Cans of beer per week    Review of Systems A complete 10 system  review of systems was obtained and all systems are negative except as noted in the HPI and PMH.   Allergies  Metformin and related  Home Medications   Prior to Admission medications   Medication Sig Start Date End Date Taking? Authorizing Provider  baclofen (LIORESAL) 10 MG tablet Take 1 tablet (10 mg total) by mouth every 8 (eight) hours as needed for muscle spasms. 08/26/15   Ranelle Oyster, MD  Blood Glucose Monitoring Suppl (ACCU-CHEK AVIVA PLUS) W/DEVICE KIT 1 each by Does not apply route 2 (two) times daily. To check blood sugar twice daily. diag code E11.40. Non insulin dependent. Please check with pt that this is the equipment he needs. 09/08/15   Burns Spain, MD  celecoxib (CELEBREX) 200 MG capsule Take 1 capsule (200 mg total) by mouth daily. 03/01/16 03/01/17  Burns Spain, MD  clotrimazole-betamethasone (LOTRISONE) cream Apply 1 application topically 2 (two) times daily. 04/20/15   Delories Heinz, DPM  fluticasone (FLONASE) 50 MCG/ACT nasal spray Place 2 sprays into both nostrils daily. 05/24/15   Burns Spain, MD  gabapentin (NEURONTIN) 300 MG capsule Take 1 capsule (300 mg total) by mouth 3 (three) times daily. 03/19/16   Burns Spain, MD  glucose blood (FREESTYLE LITE) test strip Check blood sugar one time a day as instructed 02/10/16  [name], MD  Lancet Devices MISC 1 each by Does not apply route 2 (two) times daily. Please use to check blood sugar 2 times daily. diag code E11.40. noninsulin dependent 09/01/15   [name], MD  lisinopril (PRINIVIL,ZESTRIL) 20 MG tablet Take 1 tablet (20 mg total) by mouth daily. 01/06/16   [name], MD  omeprazole (PRILOSEC) 20 MG capsule Take 1 capsule (20 mg total) by mouth daily. 03/19/16   [name], MD  pioglitazone (ACTOS) 45 MG tablet Take 1 tablet (45 mg total) by mouth daily. 03/01/16   [name], MD  simvastatin (ZOCOR) 40 MG tablet Take 1 tablet (40 mg total) by  mouth every evening. 01/06/16 01/05/17  [name], MD   BP 140/70 mmHg  Pulse 90  Temp(Src) 97.6 F (36.4 C) (Oral)  Resp 18  SpO2 98% Physical Exam  Constitutional: He is oriented to person, place, and time. He appears well-developed and well-nourished.  HENT:  Head: Normocephalic and atraumatic.  Eyes: EOM are normal.  Neck: Normal range of motion.  Cardiovascular: Normal rate.   Pulmonary/Chest: Effort normal. No respiratory distress.  Abdominal: Soft.  Musculoskeletal: Normal range of motion.  Right index finger with deep laceration from radial edge, just superior to DIP, that extends past midline. Bone is visualized. Distal portion of finger is attached via medial soft tissue. Pt reports loss of sensation in distal portion, though maintains brisk cap refill. Limited finger ROM. After thorough irrigation no foreign bodies visualized. Bleeding well controlled.  Neurological: He is alert and oriented to person, place, and time.  Skin: Skin is warm and dry.  Psychiatric: He has a normal mood and affect.  Nursing note and vitals reviewed.  ED Course  Procedures   NERVE BLOCK Performed by: Delrae Rend Consent: Verbal consent obtained. Required items: required blood products, implants, devices, and special equipment available Time out: Immediately prior to procedure a "time out" was called to verify the correct patient, procedure, equipment, support staff and site/side marked as required.  Indication: finger laceration Nerve block body site: right index finger  Preparation: Patient was prepped and draped in the usual sterile fashion. Needle gauge: 24 G Location technique: anatomical landmarks  Local anesthetic: 2% lido without epi  Anesthetic total: 5 ml  Outcome: pain improved Patient tolerance: Patient tolerated the procedure well with no immediate complications.   NERVE BLOCK Performed by: Delrae Rend Consent: Verbal consent obtained. Required items: required  blood products, implants, devices, and special equipment available Time out: Immediately prior to procedure a "time out" was called to verify the correct patient, procedure, equipment, support staff and site/side marked as required.  Indication: finger laceration, pain Nerve block body site: right index  Preparation: Patient was prepped and draped in the usual sterile fashion. Needle gauge: 24 G Location technique: anatomical landmarks  Local anesthetic: 0.5% marcaine  Anesthetic total: 5 ml  Outcome: pain improved Patient tolerance: Patient tolerated the procedure well with no immediate complications.    DIAGNOSTIC STUDIES: Oxygen Saturation is 98% on RA, normal by my interpretation.    COORDINATION OF CARE: 1:49 PM Discussed next steps with pt. He verbalized understanding and is agreeable with the plan.   Labs Review Labs Reviewed  BASIC METABOLIC PANEL - Abnormal; Notable for the following:    Glucose, Bld 140 (*)    All other components within normal limits  CBC WITH DIFFERENTIAL/PLATELET - Abnormal; Notable for the following:    Hemoglobin 11.9 (*)  HCT 36.8 (*)    MCV 72.4 (*)    MCH 23.4 (*)    All other components within normal limits    Imaging Review Dg Finger Index Right  03/26/2016  CLINICAL DATA:  Trauma, right index finger laceration EXAM: RIGHT INDEX FINGER 2+V COMPARISON:  None. FINDINGS: Three views of the right second finger submitted. There is large soft tissue laceration associated with avulsion fracture distal aspect of middle phalanx right second finger highly suspicious for open fracture. Ligamental injury distal interphalangeal joint cannot be excluded. Clinical correlation is necessary IMPRESSION: There is large soft tissue laceration associated with avulsion fracture distal aspect of middle phalanx right second finger highly suspicious for open fracture. Ligamental injury distal interphalangeal joint cannot be excluded. Clinical correlation is  necessary Electronically Signed   By: Lahoma Crocker M.D.   On: 03/26/2016 14:31   I have personally reviewed and evaluated these images as part of my medical decision-making.   EKG Interpretation None      MDM   Final diagnoses:  Laceration of right index finger w/o foreign body w/o damage to nail, initial encounter  Open fracture of phalanx of right index finger, initial encounter    I spoke to Dr. Grandville Silos of hand surgery who suspects pt will need surgery. However, he has two big OR cases at [facility] and several patients to see in clinic before pt will be seen. In the meantime will keep hand wrapped and pain controlled. Check basic pre-op labs. Ancef given in the [place]. ED to ED transfer to [facility] and pt will be seen there by Dr. Grandville Silos after his other patients.   Repeat nerve block with marcaine performed for longer term pain control. Basic labs pending. I spoke with Dr. Laneta Simmers at [facility] who will accept patient as an ED-to-ED transfer. I discussed again with pt that Dr. Grandville Silos will not be able to see him until later tonight.   CBC reveals mild anemia of 11.9/36.8. BMP with glucose of 140, otherwise unremarkable.   I personally performed the services described in this documentation, which was scribed in my presence. The recorded information has been reviewed and is accurate.   Anne Ng, PA-C 03/26/16 [place], DO 03/30/16 [phone]

## 2016-03-26 NOTE — ED Notes (Signed)
Was working with Civil engineer, contracting, taking it off, he fell and it sliced his right pointer finger. Appears to be partially amputated in triage.

## 2016-03-26 NOTE — Discharge Instructions (Addendum)
Discharge Instructions   You have a dressing with a plaster splint incorporated in it. Move your fingers as much as possible, making a full fist and fully opening the fist. Elevate your hand to reduce pain & swelling of the digits.  Ice over the operative site may be helpful to reduce pain & swelling.  DO NOT USE HEAT. Pain medicine has been prescribed for you.  Use your medicine as needed over the first 48 hours, and then you can begin to taper your use.  You may use Tylenol in place of your prescribed pain medication, but not IN ADDITION to it. Leave the dressing in place until you return to our office.  You may shower, but keep the bandage clean & dry.  You may drive a car when you are off of prescription pain medications and can safely control your vehicle with both hands. Our office will call you to arrange follow-up   Please call [phone] during normal business hours or [phone] after hours for any problems. Including the following:  - excessive redness of the incisions - drainage for more than 4 days - fever of more than 101.5 F  *Please note that pain medications will not be refilled after hours or on weekends.  WORK STATUS:  NO WORK UNTIL AT LEAST FIRST FOLLOW-UP APPOINTMENT LATER THIS WEEK.

## 2016-03-26 NOTE — Consult Note (Addendum)
ORTHOPAEDIC CONSULTATION HISTORY & PHYSICAL REQUESTING PHYSICIAN: Lyndal Pulley, MD  Chief Complaint: right index finger incomplete amputation  HPI: Donald Palmer is a 55 y.o. male who incompletely amputated his right index finger through the DIPJ at work when he was trying to turn the nut on a lawnmower.  It had a new blade applied.  The nut gave way, the blade then struck his finger.  The blade was not being moved by the power lawnmower.  He estimates that this took place at about 1300 hrs.  He was evaluated at [facility] emergency department, I was consulted, and I asked that he be transported to [facility].    Past Medical History  Diagnosis Date  . Diabetes mellitus 2007  . Neuromuscular disorder (HCC)   . Pancreatitis 01/09/2012  . Hypertension goal BP (blood pressure) < 140/80   . Hepatitis C     [id number]   History reviewed. No pertinent past surgical history. Social History   Social History  . Marital Status: Single    Spouse Name: N/A  . Number of Children: N/A  . Years of Education: N/A   Social History Main Topics  . Smoking status: Current Some Day Smoker -- 0.10 packs/day    Types: Cigarettes, Cigars    Last Attempt to Quit: 01/05/2012  . Smokeless tobacco: Never Used     Comment: 2 cigs/week  . Alcohol Use: 3.0 oz/week    5 Cans of beer per week  . Drug Use: No  . Sexual Activity: Yes    Birth Control/ Protection: None   Other Topics Concern  . None   Social History Narrative   Prior incarceration. Tattoos, inc ones obtained in prison.    On disability since 09/2013   Family History  Problem Relation Age of Onset  . Diabetes Mother   . Hypertension Mother   . Hyperlipidemia Mother   . Diabetes Brother   . Hypertension Brother   . Heart attack Neg Hx   . Sudden death Neg Hx    Allergies  Allergen Reactions  . Metformin And Related Other (See Comments)    GI side effects. Stopped 2016   Prior to Admission medications   Medication Sig Start  Date End Date Taking? Authorizing Provider  baclofen (LIORESAL) 10 MG tablet Take 1 tablet (10 mg total) by mouth every 8 (eight) hours as needed for muscle spasms. 08/26/15  Yes Ranelle Oyster, MD  Blood Glucose Monitoring Suppl (ACCU-CHEK AVIVA PLUS) W/DEVICE KIT 1 each by Does not apply route 2 (two) times daily. To check blood sugar twice daily. diag code E11.40. Non insulin dependent. Please check with pt that this is the equipment he needs. 09/08/15  Yes Burns Spain, MD  celecoxib (CELEBREX) 200 MG capsule Take 1 capsule (200 mg total) by mouth daily. 03/01/16 03/01/17 Yes Burns Spain, MD  clotrimazole-betamethasone (LOTRISONE) cream Apply 1 application topically 2 (two) times daily. 04/20/15  Yes Orlie Pollen [place], DPM  fluticasone (FLONASE) 50 MCG/ACT nasal spray Place 2 sprays into both nostrils daily. Patient taking differently: Place 2 sprays into both nostrils daily as needed for allergies.  05/24/15  Yes Burns Spain, MD  gabapentin (NEURONTIN) 300 MG capsule Take 1 capsule (300 mg total) by mouth 3 (three) times daily. Patient taking differently: Take 1 capsule (300 mg total) by mouth 3 (three) times daily as needed for pain 03/19/16  Yes Burns Spain, MD  glucose blood (FREESTYLE LITE) test strip Check blood  sugar one time a day as instructed 02/10/16  Yes [name], MD  Lancet Devices MISC 1 each by Does not apply route 2 (two) times daily. Please use to check blood sugar 2 times daily. diag code E11.40. noninsulin dependent 09/01/15  Yes [name], MD  lisinopril (PRINIVIL,ZESTRIL) 20 MG tablet Take 1 tablet (20 mg total) by mouth daily. 01/06/16  Yes [name], MD  menthol-zinc oxide (GOLD BOND) powder Apply 1 application topically daily as needed (for pain).   Yes Historical Provider, MD  omeprazole (PRILOSEC) 20 MG capsule Take 1 capsule (20 mg total) by mouth daily. Patient taking differently: Take 1 capsule (20 mg total) by mouth  daily as needed for stomach pains 03/19/16  Yes [name], MD  pioglitazone (ACTOS) 45 MG tablet Take 1 tablet (45 mg total) by mouth daily. 03/01/16  Yes [name], MD  simvastatin (ZOCOR) 40 MG tablet Take 1 tablet (40 mg total) by mouth every evening. 01/06/16 01/05/17 Yes [name], MD   Dg Finger Index Right  03/26/2016  CLINICAL DATA:  Trauma, right index finger laceration EXAM: RIGHT INDEX FINGER 2+V COMPARISON:  None. FINDINGS: Three views of the right second finger submitted. There is large soft tissue laceration associated with avulsion fracture distal aspect of middle phalanx right second finger highly suspicious for open fracture. Ligamental injury distal interphalangeal joint cannot be excluded. Clinical correlation is necessary IMPRESSION: There is large soft tissue laceration associated with avulsion fracture distal aspect of middle phalanx right second finger highly suspicious for open fracture. Ligamental injury distal interphalangeal joint cannot be excluded. Clinical correlation is necessary Electronically Signed   By: Lahoma Crocker M.D.   On: 03/26/2016 14:31    Positive ROS: All other systems have been reviewed and were otherwise negative with the exception of those mentioned in the HPI and as above.  Physical Exam: Vitals: Refer to EMR. Constitutional:  WD, WN, NAD HEENT:  NCAT, EOMI Neuro/Psych:  Alert & oriented to person, place, and time; appropriate mood & affect Lymphatic: No generalized extremity edema or lymphadenopathy Extremities / MSK:  The extremities are normal with respect to appearance, ranges of motion, joint stability, muscle strength/tone, sensation, & perfusion except as otherwise noted:  There is a sharp clean incision-like laceration on the radial border of the index finger extending nearly to the midline.  He cannot actively flex or extend the DIP joint, which appears floppy.  The distal portion of the finger remains warm with good  coloration and brisk refill.  Assessment: Right index finger incomplete amputation, with injury to bone, flexor and extensor tendon injuries, and skin.  In addition, the radial neurovascular structures are likely injured but distal to the zone possible to repair.  Plan: I discussed these findings with him and recommended operative treatment for irrigation and debridement, with the goal of diminishing the risk for infection.  In addition, skeletal stabilization will be required, and likely repair of both the flexor and extensor tendon as well as skin.   Due to the excessively long delay getting this patient with diabetes to the operating room, I elected to irrigate the  Wound under running water with a digital block in the ED, closed the skin with 4-0 Vicryl Rapide interrupted sutures.  Total laceration was 4 cm.  Placed him on antibiotics, and proceed with definitive skeletal and tendon reconstruction after giving he wound time to ensure it is not infected.  D/C today with oral antibiotics and  f/u later this week.  Rayvon Char Grandville Silos, [place], [place]  [address] Office: [phone] Mobile: [phone]  03/26/2016, 8:31 PM

## 2016-03-26 NOTE — ED Provider Notes (Signed)
MSE was initiated and I personally evaluated the patient and placed orders (if any) at  6:25 PM on March 26, 2016.  The patient appears stable so that the remainder of the MSE may be completed by another provider.  Patient here in transfer awaiting hand surgery availability. HDS, will continue to monitor until Dr Grandville Silos available to evaluate.   Dr Grandville Silos saw and repaired at bedside, will see in f/u. Stable for d/c.  [name], MD 03/27/16 [phone]

## 2016-03-27 ENCOUNTER — Telehealth: Payer: Self-pay | Admitting: Internal Medicine

## 2016-03-27 ENCOUNTER — Other Ambulatory Visit: Payer: Self-pay | Admitting: Orthopedic Surgery

## 2016-03-27 DIAGNOSIS — S6991XD Unspecified injury of right wrist, hand and finger(s), subsequent encounter: Secondary | ICD-10-CM

## 2016-03-27 NOTE — Telephone Encounter (Signed)
Rec'd Call from [facility]/for a Urgent [employer] Ref for a Laceration of the Right Finger seen in the ED this past Weekend.  Please advise.

## 2016-03-29 ENCOUNTER — Encounter (HOSPITAL_BASED_OUTPATIENT_CLINIC_OR_DEPARTMENT_OTHER)
Admission: RE | Admit: 2016-03-29 | Discharge: 2016-03-29 | Disposition: A | Discharge status: Home / Self Care | Payer: Commercial Managed Care - HMO | Source: Ambulatory Visit | Attending: Orthopedic Surgery | Admitting: Orthopedic Surgery

## 2016-03-29 ENCOUNTER — Encounter (HOSPITAL_BASED_OUTPATIENT_CLINIC_OR_DEPARTMENT_OTHER): Payer: Self-pay | Admitting: *Deleted

## 2016-03-29 DIAGNOSIS — S62620B Displaced fracture of medial phalanx of right index finger, initial encounter for open fracture: Secondary | ICD-10-CM | POA: Diagnosis not present

## 2016-03-29 DIAGNOSIS — I10 Essential (primary) hypertension: Secondary | ICD-10-CM | POA: Diagnosis not present

## 2016-03-29 DIAGNOSIS — K219 Gastro-esophageal reflux disease without esophagitis: Secondary | ICD-10-CM | POA: Diagnosis not present

## 2016-03-29 DIAGNOSIS — Z79899 Other long term (current) drug therapy: Secondary | ICD-10-CM | POA: Diagnosis not present

## 2016-03-29 DIAGNOSIS — S66120A Laceration of flexor muscle, fascia and tendon of right index finger at wrist and hand level, initial encounter: Secondary | ICD-10-CM | POA: Diagnosis not present

## 2016-03-29 DIAGNOSIS — S61411A Laceration without foreign body of right hand, initial encounter: Secondary | ICD-10-CM | POA: Diagnosis not present

## 2016-03-29 DIAGNOSIS — E119 Type 2 diabetes mellitus without complications: Secondary | ICD-10-CM | POA: Diagnosis not present

## 2016-03-29 DIAGNOSIS — B1920 Unspecified viral hepatitis C without hepatic coma: Secondary | ICD-10-CM | POA: Diagnosis not present

## 2016-03-29 DIAGNOSIS — S68120A Partial traumatic metacarpophalangeal amputation of right index finger, initial encounter: Secondary | ICD-10-CM | POA: Diagnosis not present

## 2016-03-29 DIAGNOSIS — W28XXXA Contact with powered lawn mower, initial encounter: Secondary | ICD-10-CM | POA: Diagnosis not present

## 2016-03-29 DIAGNOSIS — F17210 Nicotine dependence, cigarettes, uncomplicated: Secondary | ICD-10-CM | POA: Diagnosis not present

## 2016-03-29 DIAGNOSIS — Z7951 Long term (current) use of inhaled steroids: Secondary | ICD-10-CM | POA: Diagnosis not present

## 2016-03-29 DIAGNOSIS — Z7984 Long term (current) use of oral hypoglycemic drugs: Secondary | ICD-10-CM | POA: Diagnosis not present

## 2016-03-29 DIAGNOSIS — G709 Myoneural disorder, unspecified: Secondary | ICD-10-CM | POA: Diagnosis not present

## 2016-04-01 NOTE — Anesthesia Preprocedure Evaluation (Addendum)
Anesthesia Evaluation  Patient identified by MRN, date of birth, ID band Patient awake    Reviewed: Allergy & Precautions, NPO status , Patient's Chart, lab work & pertinent test results  History of Anesthesia Complications Negative for: history of anesthetic complications  Airway Mallampati: II  TM Distance: >3 FB Neck ROM: Full    Dental no notable dental hx. (+) Partial Upper, Dental Advisory Given   Pulmonary neg pulmonary ROS, Current Smoker,    Pulmonary exam normal breath sounds clear to auscultation       Cardiovascular hypertension, Pt. on medications + Peripheral Vascular Disease  Normal cardiovascular exam Rhythm:Regular Rate:Normal     Neuro/Psych negative neurological ROS  negative psych ROS   GI/Hepatic GERD  Medicated and Controlled,(+) Hepatitis -, C  Endo/Other  diabetes, Poorly Controlled, Type 2, Oral Hypoglycemic Agents  Renal/GU negative Renal ROS  negative genitourinary   Musculoskeletal negative musculoskeletal ROS (+)   Abdominal   Peds negative pediatric ROS (+)  Hematology negative hematology ROS (+)   Anesthesia Other Findings   Reproductive/Obstetrics negative OB ROS                           Anesthesia Physical Anesthesia Plan  ASA: III  Anesthesia Plan: General   Post-op Pain Management:    Induction: Intravenous  Airway Management Planned: LMA  Additional Equipment:   Intra-op Plan:   Post-operative Plan: Extubation in OR  Informed Consent: I have reviewed the patients History and Physical, chart, labs and discussed the procedure including the risks, benefits and alternatives for the proposed anesthesia with the patient or authorized representative who has indicated his/her understanding and acceptance.   Dental advisory given  Plan Discussed with: CRNA  Anesthesia Plan Comments:        Anesthesia Quick Evaluation

## 2016-04-02 ENCOUNTER — Ambulatory Visit (HOSPITAL_BASED_OUTPATIENT_CLINIC_OR_DEPARTMENT_OTHER): Payer: Commercial Managed Care - HMO | Admitting: Anesthesiology

## 2016-04-02 ENCOUNTER — Ambulatory Visit (HOSPITAL_COMMUNITY): Payer: Commercial Managed Care - HMO

## 2016-04-02 ENCOUNTER — Encounter (HOSPITAL_BASED_OUTPATIENT_CLINIC_OR_DEPARTMENT_OTHER)
Admission: RE | Disposition: A | Discharge status: Home / Self Care | Payer: Self-pay | Source: Ambulatory Visit | Attending: Orthopedic Surgery

## 2016-04-02 ENCOUNTER — Ambulatory Visit (HOSPITAL_BASED_OUTPATIENT_CLINIC_OR_DEPARTMENT_OTHER)
Admission: RE | Admit: 2016-04-02 | Discharge: 2016-04-02 | Disposition: A | Discharge status: Home / Self Care | Payer: Commercial Managed Care - HMO | Source: Ambulatory Visit | Attending: Orthopedic Surgery | Admitting: Orthopedic Surgery

## 2016-04-02 ENCOUNTER — Encounter (HOSPITAL_BASED_OUTPATIENT_CLINIC_OR_DEPARTMENT_OTHER): Payer: Self-pay

## 2016-04-02 DIAGNOSIS — G709 Myoneural disorder, unspecified: Secondary | ICD-10-CM | POA: Diagnosis not present

## 2016-04-02 DIAGNOSIS — K219 Gastro-esophageal reflux disease without esophagitis: Secondary | ICD-10-CM | POA: Insufficient documentation

## 2016-04-02 DIAGNOSIS — B1920 Unspecified viral hepatitis C without hepatic coma: Secondary | ICD-10-CM | POA: Insufficient documentation

## 2016-04-02 DIAGNOSIS — I739 Peripheral vascular disease, unspecified: Secondary | ICD-10-CM | POA: Diagnosis not present

## 2016-04-02 DIAGNOSIS — S61210A Laceration without foreign body of right index finger without damage to nail, initial encounter: Secondary | ICD-10-CM | POA: Diagnosis not present

## 2016-04-02 DIAGNOSIS — S66320A Laceration of extensor muscle, fascia and tendon of right index finger at wrist and hand level, initial encounter: Secondary | ICD-10-CM | POA: Diagnosis not present

## 2016-04-02 DIAGNOSIS — Z7984 Long term (current) use of oral hypoglycemic drugs: Secondary | ICD-10-CM | POA: Insufficient documentation

## 2016-04-02 DIAGNOSIS — Z7951 Long term (current) use of inhaled steroids: Secondary | ICD-10-CM | POA: Insufficient documentation

## 2016-04-02 DIAGNOSIS — Z79899 Other long term (current) drug therapy: Secondary | ICD-10-CM | POA: Insufficient documentation

## 2016-04-02 DIAGNOSIS — S63430A Traumatic rupture of volar plate of right index finger at metacarpophalangeal and interphalangeal joint, initial encounter: Secondary | ICD-10-CM | POA: Diagnosis not present

## 2016-04-02 DIAGNOSIS — W28XXXA Contact with powered lawn mower, initial encounter: Secondary | ICD-10-CM | POA: Insufficient documentation

## 2016-04-02 DIAGNOSIS — S62620A Displaced fracture of medial phalanx of right index finger, initial encounter for closed fracture: Secondary | ICD-10-CM | POA: Diagnosis not present

## 2016-04-02 DIAGNOSIS — S66120A Laceration of flexor muscle, fascia and tendon of right index finger at wrist and hand level, initial encounter: Secondary | ICD-10-CM | POA: Insufficient documentation

## 2016-04-02 DIAGNOSIS — F17210 Nicotine dependence, cigarettes, uncomplicated: Secondary | ICD-10-CM | POA: Diagnosis not present

## 2016-04-02 DIAGNOSIS — I10 Essential (primary) hypertension: Secondary | ICD-10-CM | POA: Insufficient documentation

## 2016-04-02 DIAGNOSIS — S62620B Displaced fracture of medial phalanx of right index finger, initial encounter for open fracture: Secondary | ICD-10-CM | POA: Diagnosis not present

## 2016-04-02 DIAGNOSIS — S68120A Partial traumatic metacarpophalangeal amputation of right index finger, initial encounter: Secondary | ICD-10-CM | POA: Diagnosis not present

## 2016-04-02 DIAGNOSIS — E119 Type 2 diabetes mellitus without complications: Secondary | ICD-10-CM | POA: Diagnosis not present

## 2016-04-02 DIAGNOSIS — Z419 Encounter for procedure for purposes other than remedying health state, unspecified: Secondary | ICD-10-CM

## 2016-04-02 HISTORY — PX: OPEN REDUCTION INTERNAL FIXATION (ORIF) DISTAL PHALANX: SHX6236

## 2016-04-02 LAB — GLUCOSE, CAPILLARY
GLUCOSE-CAPILLARY: 147 mg/dL — AB (ref 65–99)
GLUCOSE-CAPILLARY: 135 mg/dL — AB (ref 65–99)

## 2016-04-02 SURGERY — OPEN REDUCTION INTERNAL FIXATION (ORIF) DISTAL PHALANX
Anesthesia: General | Site: Finger | Laterality: Right

## 2016-04-02 MED ORDER — CEFAZOLIN SODIUM-DEXTROSE 2-4 GM/100ML-% IV SOLN
2.0000 g | INTRAVENOUS | Status: AC
  Administered 2016-04-02: 2 g via INTRAVENOUS

## 2016-04-02 MED ORDER — PROPOFOL 10 MG/ML IV BOLUS
INTRAVENOUS | Status: DC | PRN
  Administered 2016-04-02: 150 mg via INTRAVENOUS

## 2016-04-02 MED ORDER — OXYCODONE-ACETAMINOPHEN 5-325 MG PO TABS: 1.0000 | ORAL_TABLET | ORAL | Status: DC | PRN

## 2016-04-02 MED ORDER — LACTATED RINGERS IV SOLN
INTRAVENOUS | Status: DC
  Administered 2016-04-02 (×2): via INTRAVENOUS

## 2016-04-02 MED ORDER — LACTATED RINGERS IV SOLN: INTRAVENOUS | Status: DC

## 2016-04-02 MED ORDER — BUPIVACAINE-EPINEPHRINE (PF) 0.5% -1:200000 IJ SOLN
INTRAMUSCULAR | Status: AC
  Filled 2016-04-02: qty 90

## 2016-04-02 MED ORDER — ONDANSETRON HCL 4 MG/2ML IJ SOLN
INTRAMUSCULAR | Status: AC
  Filled 2016-04-02: qty 2

## 2016-04-02 MED ORDER — FENTANYL CITRATE (PF) 100 MCG/2ML IJ SOLN
50.0000 ug | INTRAMUSCULAR | Status: AC | PRN
  Administered 2016-04-02: 50 ug via INTRAVENOUS
  Administered 2016-04-02 (×2): 25 ug via INTRAVENOUS

## 2016-04-02 MED ORDER — BUPIVACAINE-EPINEPHRINE 0.5% -1:200000 IJ SOLN
INTRAMUSCULAR | Status: DC | PRN
  Administered 2016-04-02: 10 mL

## 2016-04-02 MED ORDER — FENTANYL CITRATE (PF) 100 MCG/2ML IJ SOLN
INTRAMUSCULAR | Status: AC
  Filled 2016-04-02: qty 2

## 2016-04-02 MED ORDER — ONDANSETRON HCL 4 MG/2ML IJ SOLN
INTRAMUSCULAR | Status: DC | PRN
  Administered 2016-04-02: 4 mg via INTRAVENOUS

## 2016-04-02 MED ORDER — CEFAZOLIN SODIUM-DEXTROSE 2-4 GM/100ML-% IV SOLN
INTRAVENOUS | Status: AC
  Filled 2016-04-02: qty 100

## 2016-04-02 MED ORDER — MIDAZOLAM HCL 2 MG/2ML IJ SOLN
INTRAMUSCULAR | Status: AC
  Filled 2016-04-02: qty 2

## 2016-04-02 MED ORDER — LIDOCAINE HCL (CARDIAC) 20 MG/ML IV SOLN
INTRAVENOUS | Status: DC | PRN
  Administered 2016-04-02: 60 mg via INTRAVENOUS

## 2016-04-02 MED ORDER — EPHEDRINE SULFATE 50 MG/ML IJ SOLN
INTRAMUSCULAR | Status: DC | PRN
  Administered 2016-04-02: 10 mg via INTRAVENOUS

## 2016-04-02 MED ORDER — BUPIVACAINE HCL (PF) 0.25 % IJ SOLN
INTRAMUSCULAR | Status: AC
  Filled 2016-04-02: qty 30

## 2016-04-02 MED ORDER — MIDAZOLAM HCL 2 MG/2ML IJ SOLN
1.0000 mg | INTRAMUSCULAR | Status: DC | PRN
  Administered 2016-04-02: 2 mg via INTRAVENOUS

## 2016-04-02 MED ORDER — PHENYLEPHRINE 40 MCG/ML (10ML) SYRINGE FOR IV PUSH (FOR BLOOD PRESSURE SUPPORT)
PREFILLED_SYRINGE | INTRAVENOUS | Status: AC
  Filled 2016-04-02: qty 10

## 2016-04-02 MED ORDER — LIDOCAINE HCL (CARDIAC) 20 MG/ML IV SOLN
INTRAVENOUS | Status: AC
  Filled 2016-04-02: qty 5

## 2016-04-02 MED ORDER — SCOPOLAMINE 1 MG/3DAYS TD PT72: 1.0000 | MEDICATED_PATCH | Freq: Once | TRANSDERMAL | Status: DC | PRN

## 2016-04-02 MED ORDER — FENTANYL CITRATE (PF) 100 MCG/2ML IJ SOLN: 25.0000 ug | INTRAMUSCULAR | Status: DC | PRN

## 2016-04-02 MED ORDER — PROPOFOL 500 MG/50ML IV EMUL
INTRAVENOUS | Status: AC
  Filled 2016-04-02: qty 50

## 2016-04-02 MED ORDER — DEXAMETHASONE SODIUM PHOSPHATE 10 MG/ML IJ SOLN
INTRAMUSCULAR | Status: DC | PRN
  Administered 2016-04-02: 4 mg via INTRAVENOUS

## 2016-04-02 MED ORDER — DEXAMETHASONE SODIUM PHOSPHATE 10 MG/ML IJ SOLN
INTRAMUSCULAR | Status: AC
  Filled 2016-04-02: qty 1

## 2016-04-02 MED ORDER — LIDOCAINE HCL (PF) 1 % IJ SOLN
INTRAMUSCULAR | Status: AC
  Filled 2016-04-02: qty 90

## 2016-04-02 MED ORDER — GLYCOPYRROLATE 0.2 MG/ML IJ SOLN: 0.2000 mg | Freq: Once | INTRAMUSCULAR | Status: DC | PRN

## 2016-04-02 MED ORDER — SUCCINYLCHOLINE CHLORIDE 20 MG/ML IJ SOLN
INTRAMUSCULAR | Status: AC
  Filled 2016-04-02: qty 1

## 2016-04-02 MED ORDER — ONDANSETRON HCL 4 MG/2ML IJ SOLN: 4.0000 mg | Freq: Once | INTRAMUSCULAR | Status: DC | PRN

## 2016-04-02 MED ORDER — BUPIVACAINE HCL (PF) 0.5 % IJ SOLN
INTRAMUSCULAR | Status: AC
  Filled 2016-04-02: qty 60

## 2016-04-02 SURGICAL SUPPLY — 42 items
ANCHOR JUGGERKNOT 1.0 3-0 NLD (Anchor) ×3 IMPLANT
BLADE SURG 15 STRL LF DISP TIS (BLADE) ×1 IMPLANT
BLADE SURG 15 STRL SS (BLADE) ×2
BNDG COHESIVE 4X5 TAN STRL (GAUZE/BANDAGES/DRESSINGS) ×3 IMPLANT
BNDG ESMARK 4X9 LF (GAUZE/BANDAGES/DRESSINGS) ×3 IMPLANT
BNDG GAUZE ELAST 4 BULKY (GAUZE/BANDAGES/DRESSINGS) ×3 IMPLANT
CHLORAPREP W/TINT 26ML (MISCELLANEOUS) ×3 IMPLANT
CORDS BIPOLAR (ELECTRODE) ×3 IMPLANT
COVER BACK TABLE 60X90IN (DRAPES) ×3 IMPLANT
COVER MAYO STAND STRL (DRAPES) ×3 IMPLANT
CUFF TOURNIQUET SINGLE 18IN (TOURNIQUET CUFF) ×3 IMPLANT
DRAPE C-ARM 42X72 X-RAY (DRAPES) ×3 IMPLANT
DRAPE EXTREMITY T 121X128X90 (DRAPE) ×3 IMPLANT
DRAPE SURG 17X23 STRL (DRAPES) ×3 IMPLANT
DRSG EMULSION OIL 3X3 NADH (GAUZE/BANDAGES/DRESSINGS) ×3 IMPLANT
GAUZE SPONGE 4X4 12PLY STRL (GAUZE/BANDAGES/DRESSINGS) ×3 IMPLANT
GLOVE BIO SURGEON STRL SZ7.5 (GLOVE) ×3 IMPLANT
GLOVE BIOGEL PI IND STRL 7.0 (GLOVE) ×3 IMPLANT
GLOVE BIOGEL PI IND STRL 8 (GLOVE) ×1 IMPLANT
GLOVE BIOGEL PI INDICATOR 7.0 (GLOVE) ×6
GLOVE BIOGEL PI INDICATOR 8 (GLOVE) ×2
GLOVE ECLIPSE 6.5 STRL STRAW (GLOVE) ×6 IMPLANT
GOWN STRL REUS W/ TWL LRG LVL3 (GOWN DISPOSABLE) ×2 IMPLANT
GOWN STRL REUS W/TWL LRG LVL3 (GOWN DISPOSABLE) ×4
GOWN STRL REUS W/TWL XL LVL3 (GOWN DISPOSABLE) ×3 IMPLANT
K-WIRE .045X4 (WIRE) ×3 IMPLANT
NEEDLE HYPO 22GX1.5 SAFETY (NEEDLE) ×3 IMPLANT
NS IRRIG 1000ML POUR BTL (IV SOLUTION) ×3 IMPLANT
PACK BASIN DAY SURGERY FS (CUSTOM PROCEDURE TRAY) ×3 IMPLANT
PADDING CAST ABS 4INX4YD NS (CAST SUPPLIES) ×2
PADDING CAST ABS COTTON 4X4 ST (CAST SUPPLIES) ×1 IMPLANT
SPLINT PLASTER CAST XFAST 3X15 (CAST SUPPLIES) ×7 IMPLANT
SPLINT PLASTER XTRA FASTSET 3X (CAST SUPPLIES) ×14
STOCKINETTE 6  STRL (DRAPES) ×2
STOCKINETTE 6 STRL (DRAPES) ×1 IMPLANT
SUT PROLENE 6 0 P 1 18 (SUTURE) ×3 IMPLANT
SUT SUPRAMID 3-0 (SUTURE) ×3 IMPLANT
SUT VICRYL RAPIDE 4/0 PS 2 (SUTURE) ×6 IMPLANT
SYR BULB 3OZ (MISCELLANEOUS) ×3 IMPLANT
SYRINGE 10CC LL (SYRINGE) ×3 IMPLANT
TOWEL OR 17X24 6PK STRL BLUE (TOWEL DISPOSABLE) ×3 IMPLANT
UNDERPAD 30X30 (UNDERPADS AND DIAPERS) ×3 IMPLANT

## 2016-04-02 NOTE — H&P (View-Only) (Signed)
ORTHOPAEDIC CONSULTATION HISTORY & PHYSICAL REQUESTING PHYSICIAN: [name], MD  Chief Complaint: right index finger incomplete amputation  HPI: Donald Palmer is a 55 y.o. male who incompletely amputated his right index finger through the DIPJ at work when he was trying to turn the nut on a lawnmower.  It had a new blade applied.  The nut gave way, the blade then struck his finger.  The blade was not being moved by the power lawnmower.  He estimates that this took place at about 1300 hrs.  He was evaluated at [facility] emergency department, I was consulted, and I asked that he be transported to [facility].    Past Medical History  Diagnosis Date  . Diabetes mellitus 2007  . Neuromuscular disorder ([place])   . Pancreatitis 01/09/2012  . Hypertension goal BP (blood pressure) < 140/80   . Hepatitis C     [id number]   History reviewed. No pertinent past surgical history. Social History   Social History  . Marital Status: Single    Spouse Name: N/A  . Number of Children: N/A  . Years of Education: N/A   Social History Main Topics  . Smoking status: Current Some Day Smoker -- 0.10 packs/day    Types: Cigarettes, Cigars    Last Attempt to Quit: 01/05/2012  . Smokeless tobacco: Never Used     Comment: 2 cigs/week  . Alcohol Use: 3.0 oz/week    5 Cans of beer per week  . Drug Use: No  . Sexual Activity: Yes    Birth Control/ Protection: None   Other Topics Concern  . None   Social History Narrative   Prior incarceration. Tattoos, inc ones obtained in prison.    On disability since 09/2013   Family History  Problem Relation Age of Onset  . Diabetes Mother   . Hypertension Mother   . Hyperlipidemia Mother   . Diabetes Brother   . Hypertension Brother   . Heart attack Neg Hx   . Sudden death Neg Hx    Allergies  Allergen Reactions  . Metformin And Related Other (See Comments)    GI side effects. Stopped 2016   Prior to Admission medications   Medication Sig Start  Date End Date Taking? Authorizing Provider  baclofen (LIORESAL) 10 MG tablet Take 1 tablet (10 mg total) by mouth every 8 (eight) hours as needed for muscle spasms. 08/26/15  Yes [name], MD  Blood Glucose Monitoring Suppl (ACCU-CHEK AVIVA PLUS) W/DEVICE KIT 1 each by Does not apply route 2 (two) times daily. To check blood sugar twice daily. diag code E11.40. Non insulin dependent. Please check with pt that this is the equipment he needs. 09/08/15  Yes [name], MD  celecoxib (CELEBREX) 200 MG capsule Take 1 capsule (200 mg total) by mouth daily. 03/01/16 03/01/17 Yes [name], MD  clotrimazole-betamethasone (LOTRISONE) cream Apply 1 application topically 2 (two) times daily. 04/20/15  Yes Viona Gilmore Egerton, DPM  fluticasone (FLONASE) 50 MCG/ACT nasal spray Place 2 sprays into both nostrils daily. Patient taking differently: Place 2 sprays into both nostrils daily as needed for allergies.  05/24/15  Yes [name], MD  gabapentin (NEURONTIN) 300 MG capsule Take 1 capsule (300 mg total) by mouth 3 (three) times daily. Patient taking differently: Take 1 capsule (300 mg total) by mouth 3 (three) times daily as needed for pain 03/19/16  Yes [name], MD  glucose blood (FREESTYLE LITE) test strip Check blood  sugar one time a day as instructed 02/10/16  Yes [name], MD  Lancet Devices MISC 1 each by Does not apply route 2 (two) times daily. Please use to check blood sugar 2 times daily. diag code E11.40. noninsulin dependent 09/01/15  Yes [name], MD  lisinopril (PRINIVIL,ZESTRIL) 20 MG tablet Take 1 tablet (20 mg total) by mouth daily. 01/06/16  Yes [name], MD  menthol-zinc oxide (GOLD BOND) powder Apply 1 application topically daily as needed (for pain).   Yes Historical Provider, MD  omeprazole (PRILOSEC) 20 MG capsule Take 1 capsule (20 mg total) by mouth daily. Patient taking differently: Take 1 capsule (20 mg total) by mouth  daily as needed for stomach pains 03/19/16  Yes [name], MD  pioglitazone (ACTOS) 45 MG tablet Take 1 tablet (45 mg total) by mouth daily. 03/01/16  Yes [name], MD  simvastatin (ZOCOR) 40 MG tablet Take 1 tablet (40 mg total) by mouth every evening. 01/06/16 01/05/17 Yes [name], MD   Dg Finger Index Right  03/26/2016  CLINICAL DATA:  Trauma, right index finger laceration EXAM: RIGHT INDEX FINGER 2+V COMPARISON:  None. FINDINGS: Three views of the right second finger submitted. There is large soft tissue laceration associated with avulsion fracture distal aspect of middle phalanx right second finger highly suspicious for open fracture. Ligamental injury distal interphalangeal joint cannot be excluded. Clinical correlation is necessary IMPRESSION: There is large soft tissue laceration associated with avulsion fracture distal aspect of middle phalanx right second finger highly suspicious for open fracture. Ligamental injury distal interphalangeal joint cannot be excluded. Clinical correlation is necessary Electronically Signed   By: Lahoma Crocker M.D.   On: 03/26/2016 14:31    Positive ROS: All other systems have been reviewed and were otherwise negative with the exception of those mentioned in the HPI and as above.  Physical Exam: Vitals: Refer to EMR. Constitutional:  WD, WN, NAD HEENT:  NCAT, EOMI Neuro/Psych:  Alert & oriented to person, place, and time; appropriate mood & affect Lymphatic: No generalized extremity edema or lymphadenopathy Extremities / MSK:  The extremities are normal with respect to appearance, ranges of motion, joint stability, muscle strength/tone, sensation, & perfusion except as otherwise noted:  There is a sharp clean incision-like laceration on the radial border of the index finger extending nearly to the midline.  He cannot actively flex or extend the DIP joint, which appears floppy.  The distal portion of the finger remains warm with good  coloration and brisk refill.  Assessment: Right index finger incomplete amputation, with injury to bone, flexor and extensor tendon injuries, and skin.  In addition, the radial neurovascular structures are likely injured but distal to the zone possible to repair.  Plan: I discussed these findings with him and recommended operative treatment for irrigation and debridement, with the goal of diminishing the risk for infection.  In addition, skeletal stabilization will be required, and likely repair of both the flexor and extensor tendon as well as skin.   Due to the excessively long delay getting this patient with diabetes to the operating room, I elected to irrigate the  Wound under running water with a digital block in the ED, closed the skin with 4-0 Vicryl Rapide interrupted sutures.  Total laceration was 4 cm.  Placed him on antibiotics, and proceed with definitive skeletal and tendon reconstruction after giving he wound time to ensure it is not infected.  D/C today with oral antibiotics and  f/u later this week.  Rayvon Char [place] Silos, [place], [place]  [id number] Office: [phone] Mobile: [phone]  03/26/2016, 8:31 PM

## 2016-04-02 NOTE — Op Note (Signed)
04/02/2016  7:26 AM  PATIENT:  Donald Palmer  55 y.o. male  PRE-OPERATIVE DIAGNOSIS:  R IF incomplete amputation  POST-OPERATIVE DIAGNOSIS:  Same  PROCEDURE:  Irrigation and excisional debridement of right index finger open fracture to include skin, subcutaneous tissues, and tendon; ORIF right index finger middle phalanx fracture; repair of terminal extensor tendon in zone 1; repair flexor digitorum profundus zone 1; repair of right index finger DIP volar plate; repair of right index finger skin, 5 cm  SURGEON: Rayvon Char. [place] Silos, MD  PHYSICIAN ASSISTANT: Morley Kos, OPA-C  ANESTHESIA:  general  SPECIMENS:  None  DRAINS:   None  EBL:  less than 50 mL  PREOPERATIVE INDICATIONS:  Donald Palmer is a  55 y.o. male with an incomplete amputation of the R IF thru the DIPJ, with fracture of the middle phalanx and laceration of the FDP & extensor tendon  The risks benefits and alternatives were discussed with the patient preoperatively including but not limited to the risks of infection, bleeding, nerve injury, cardiopulmonary complications, the need for revision surgery, among others, and the patient verbalized understanding and consented to proceed.  OPERATIVE IMPLANTS: 0.045 inch K wire 1; Mini Juggerknot suture anchor  OPERATIVE PROCEDURE:  After receiving prophylactic antibiotics, the patient was escorted to the operative theatre and placed in a supine position.  General anesthesia was administered. A surgical "time-out" was performed during which the planned procedure, proposed operative site, and the correct patient identity were compared to the operative consent and agreement confirmed by the circulating nurse according to current facility policy.  Following application of a tourniquet to the operative extremity, the exposed skin was pre-scrubbed with a hibiclens scrub brush before being formally prepped with Chloraprep and draped in the usual sterile fashion.  The limb was  exsanguinated with an Esmarch bandage and the tourniquet inflated to approximately 128mmHg higher than systolic BP.  Half percent Marcaine with epinephrine was instilled at the base of the right index finger for postoperative pain control.  The sutures are removed from the wound, and the wound opened and irrigated copiously. Skin edges were debrided with scissor excisional debridement. Flexor and extensor tendon ends were debrided excisionally as well. The wound was copiously irrigated. Flexor tendon was not retrieved at this level, so the wound was extended and midaxillary along the middle phalanx and then obliquely across the volar surface of the distal portion of the proximal phalanx in a Bruner approach, allowing for elevation of the volar flap over the middle phalanx. This also allowed for elevation of the dorsal flap. Distally 2 separate extensions were made to allow for exposure of the distal flexor and extensor tendons. The FDP was then identified and the sheath, advanced and secured with a 3-0 Supramid loop suture to gain control of it. It was at this time that the end of the flexor was debrided sharply to some degree. With everything thus prepared, and the wound had been irrigated, a 0.045 inch K wire was driven obliquely across a small fragment securing it to the rest of the middle phalanx. It was bent over the skin and clipped.  At the volar base of the distal phalanx, a hole was drilled for it and a mini juggerknot suture anchor placed. The tails were then brought through the volar plate and tied, securing repair of the volar plate to the distal phalanx. These tails were then brought through the stump of the FDP and used to affect AT Olin Hauser modified Kessler repair through  the FDP. After this was done, the 30 loop Supramid was brought as a core stitch through the FDP into the distal stump where it was also tied in a locking fashion, essentially making this now for [facility] repair. The volar surface was  then further reinforced with 6-0 Prolene running locked epitendinous suture. Dorsally, the extensor tendon was repaired with a couple of figure 8 sutures placed with remnants from the anchor suture. The flexor tendon was noted not to gap with full extension of the digit. The tourniquet was released, distal hemostasis obtained and the wound was again irrigated before the skin was closed with 4-0 Vicryl Rapide interrupted sutures. A short arm splint dressing was applied placed in the MP joints flexed, the IP is extended and he was taken to the recovery room in stable condition, breathing spontaneously.  DISPOSITION: He'll be discharged home today with triple instructions, returning later this week to have a protective splint constructed in hand therapy.  I think that this can be a typical clamshell dorsal block splint and will begin initially passive range of motion exercises in each direction, strapped down against the splint in extension as necessary between exercises. I think we can simply begin active motion as well. He will plan to see me in 10-15 days for splint and wound check.

## 2016-04-02 NOTE — Interval H&P Note (Signed)
History and Physical Interval Note:  04/02/2016 7:26 AM  Donald Palmer  has presented today for surgery, with the diagnosis of INCOMPLETE AMPUTATION RIGHT INDEX FINGER S62.620B  The various methods of treatment have been discussed with the patient and family. After consideration of risks, benefits and other options for treatment, the patient has consented to  Procedure(s): RIGHT INDEX FINGER REPAIR (Right) as a surgical intervention .  The patient's history has been reviewed, patient examined, no change in status, stable for surgery.  I have reviewed the patient's chart and labs.  Questions were answered to the patient's satisfaction.     Denaly Gatling A.

## 2016-04-02 NOTE — Anesthesia Procedure Notes (Signed)
Procedure Name: LMA Insertion Date/Time: 04/02/2016 7:46 AM Performed by: Brittnie Lewey D Pre-anesthesia Checklist: Patient identified, Emergency Drugs available, Suction available and Patient being monitored Patient Re-evaluated:Patient Re-evaluated prior to inductionOxygen Delivery Method: Circle System Utilized Preoxygenation: Pre-oxygenation with 100% oxygen Intubation Type: IV induction Ventilation: Mask ventilation without difficulty LMA: LMA inserted LMA Size: 4.0 Number of attempts: 1 Airway Equipment and Method: Bite block Placement Confirmation: positive ETCO2 Tube secured with: Tape Dental Injury: Teeth and Oropharynx as per pre-operative assessment

## 2016-04-02 NOTE — Anesthesia Postprocedure Evaluation (Signed)
Anesthesia Post Note  Patient: Donald Palmer  Procedure(s) Performed: Procedure(s) (LRB): RIGHT INDEX FINGER REPAIR (Right)  Patient location during evaluation: PACU Anesthesia Type: General Level of consciousness: awake and alert Pain management: pain level controlled Vital Signs Assessment: post-procedure vital signs reviewed and stable Respiratory status: spontaneous breathing, nonlabored ventilation, respiratory function stable and patient connected to nasal cannula oxygen Cardiovascular status: blood pressure returned to baseline and stable Postop Assessment: no signs of nausea or vomiting Anesthetic complications: no    Last Vitals:  Filed Vitals:   04/02/16 0945 04/02/16 1012  BP: 148/92 155/87  Pulse: 82 68  Temp:  36.6 C  Resp: 17 16    Last Pain:  Filed Vitals:   04/02/16 1013  PainSc: Asleep                 Avya Flavell JENNETTE

## 2016-04-02 NOTE — Transfer of Care (Signed)
Immediate Anesthesia Transfer of Care Note  Patient: Donald Palmer  Procedure(s) Performed: Procedure(s): RIGHT INDEX FINGER REPAIR (Right)  Patient Location: PACU  Anesthesia Type:General  Level of Consciousness: awake and patient cooperative  Airway & Oxygen Therapy: Patient Spontanous Breathing and Patient connected to face mask oxygen  Post-op Assessment: Report given to RN and Post -op Vital signs reviewed and stable  Post vital signs: Reviewed and stable  Last Vitals:  Filed Vitals:   04/02/16 0636  BP: 129/76  Pulse: 65  Temp: 36.7 C  Resp: 18    Complications: No apparent anesthesia complications

## 2016-04-02 NOTE — Discharge Instructions (Signed)
Discharge Instructions   You have a dressing with a plaster splint incorporated in it. Move your fingers as much as possible, making a full fist and fully opening the fist. Elevate your hand to reduce pain & swelling of the digits.  Ice over the operative site may be helpful to reduce pain & swelling.  DO NOT USE HEAT. Pain medicine has been prescribed for you.  Use your medicine as needed over the first 48 hours, and then you can begin to taper your use.  You may use Tylenol in place of your prescribed pain medication, but not IN ADDITION to it. Leave the dressing in place until you return to our office.  You may shower, but keep the bandage clean & dry.  You may drive a car when you are off of prescription pain medications and can safely control your vehicle with both hands. Our office will call you to arrange follow-up   Please call [phone] during normal business hours or [phone] after hours for any problems. Including the following:  - excessive redness of the incisions - drainage for more than 4 days - fever of more than 101.5 F  *Please note that pain medications will not be refilled after hours or on weekends.  WORK STATUS: NO WORK UNTIL AT LEAST THE FIRST POSTOP VISIT WITH DR. Oma Alpert IN 10-15 DAYS  Post Anesthesia Home Care Instructions  Activity: Get plenty of rest for the remainder of the day. A responsible adult should stay with you for 24 hours following the procedure.  For the next 24 hours, DO NOT: -Drive a car -Paediatric nurse -Drink alcoholic beverages -Take any medication unless instructed by your physician -Make any legal decisions or sign important papers.  Meals: Start with liquid foods such as gelatin or soup. Progress to regular foods as tolerated. Avoid greasy, spicy, heavy foods. If nausea and/or vomiting occur, drink only clear liquids until the nausea and/or vomiting subsides. Call your physician if vomiting continues.  Special  Instructions/Symptoms: Your throat may feel dry or sore from the anesthesia or the breathing tube placed in your throat during surgery. If this causes discomfort, gargle with warm salt water. The discomfort should disappear within 24 hours.  If you had a scopolamine patch placed behind your ear for the management of post- operative nausea and/or vomiting:  1. The medication in the patch is effective for 72 hours, after which it should be removed.  Wrap patch in a tissue and discard in the trash. Wash hands thoroughly with soap and water. 2. You may remove the patch earlier than 72 hours if you experience unpleasant side effects which may include dry mouth, dizziness or visual disturbances. 3. Avoid touching the patch. Wash your hands with soap and water after contact with the patch.

## 2016-04-03 ENCOUNTER — Encounter (HOSPITAL_BASED_OUTPATIENT_CLINIC_OR_DEPARTMENT_OTHER): Payer: Self-pay | Admitting: Orthopedic Surgery

## 2016-04-05 DIAGNOSIS — S66300D Unspecified injury of extensor muscle, fascia and tendon of right index finger at wrist and hand level, subsequent encounter: Secondary | ICD-10-CM | POA: Diagnosis not present

## 2016-04-05 DIAGNOSIS — S62620D Displaced fracture of medial phalanx of right index finger, subsequent encounter for fracture with routine healing: Secondary | ICD-10-CM | POA: Diagnosis not present

## 2016-04-05 DIAGNOSIS — S66120D Laceration of flexor muscle, fascia and tendon of right index finger at wrist and hand level, subsequent encounter: Secondary | ICD-10-CM | POA: Diagnosis not present

## 2016-04-16 ENCOUNTER — Encounter: Payer: Self-pay | Admitting: Internal Medicine

## 2016-04-16 DIAGNOSIS — S61411D Laceration without foreign body of right hand, subsequent encounter: Secondary | ICD-10-CM | POA: Diagnosis not present

## 2016-04-16 DIAGNOSIS — S62620D Displaced fracture of medial phalanx of right index finger, subsequent encounter for fracture with routine healing: Secondary | ICD-10-CM | POA: Diagnosis not present

## 2016-04-19 ENCOUNTER — Ambulatory Visit: Payer: Commercial Managed Care - HMO | Admitting: Internal Medicine

## 2016-04-25 DIAGNOSIS — S66300D Unspecified injury of extensor muscle, fascia and tendon of right index finger at wrist and hand level, subsequent encounter: Secondary | ICD-10-CM | POA: Diagnosis not present

## 2016-04-25 DIAGNOSIS — S62620D Displaced fracture of medial phalanx of right index finger, subsequent encounter for fracture with routine healing: Secondary | ICD-10-CM | POA: Diagnosis not present

## 2016-04-25 DIAGNOSIS — S66120D Laceration of flexor muscle, fascia and tendon of right index finger at wrist and hand level, subsequent encounter: Secondary | ICD-10-CM | POA: Diagnosis not present

## 2016-04-28 ENCOUNTER — Encounter (HOSPITAL_COMMUNITY): Payer: Self-pay

## 2016-04-28 ENCOUNTER — Emergency Department (HOSPITAL_COMMUNITY)
Admission: EM | Admit: 2016-04-28 | Discharge: 2016-04-28 | Disposition: A | Discharge status: Home / Self Care | Payer: Commercial Managed Care - HMO | Attending: Emergency Medicine | Admitting: Emergency Medicine

## 2016-04-28 DIAGNOSIS — Z792 Long term (current) use of antibiotics: Secondary | ICD-10-CM | POA: Diagnosis not present

## 2016-04-28 DIAGNOSIS — Z7951 Long term (current) use of inhaled steroids: Secondary | ICD-10-CM | POA: Diagnosis not present

## 2016-04-28 DIAGNOSIS — I10 Essential (primary) hypertension: Secondary | ICD-10-CM | POA: Insufficient documentation

## 2016-04-28 DIAGNOSIS — Z8619 Personal history of other infectious and parasitic diseases: Secondary | ICD-10-CM | POA: Diagnosis not present

## 2016-04-28 DIAGNOSIS — Z7952 Long term (current) use of systemic steroids: Secondary | ICD-10-CM | POA: Diagnosis not present

## 2016-04-28 DIAGNOSIS — K5900 Constipation, unspecified: Secondary | ICD-10-CM | POA: Insufficient documentation

## 2016-04-28 DIAGNOSIS — F17210 Nicotine dependence, cigarettes, uncomplicated: Secondary | ICD-10-CM | POA: Diagnosis not present

## 2016-04-28 DIAGNOSIS — E1343 Other specified diabetes mellitus with diabetic autonomic (poly)neuropathy: Secondary | ICD-10-CM

## 2016-04-28 DIAGNOSIS — Z7984 Long term (current) use of oral hypoglycemic drugs: Secondary | ICD-10-CM | POA: Insufficient documentation

## 2016-04-28 DIAGNOSIS — Z8669 Personal history of other diseases of the nervous system and sense organs: Secondary | ICD-10-CM | POA: Insufficient documentation

## 2016-04-28 DIAGNOSIS — Z79899 Other long term (current) drug therapy: Secondary | ICD-10-CM | POA: Insufficient documentation

## 2016-04-28 DIAGNOSIS — E1143 Type 2 diabetes mellitus with diabetic autonomic (poly)neuropathy: Secondary | ICD-10-CM | POA: Diagnosis not present

## 2016-04-28 DIAGNOSIS — Z791 Long term (current) use of non-steroidal anti-inflammatories (NSAID): Secondary | ICD-10-CM | POA: Insufficient documentation

## 2016-04-28 DIAGNOSIS — R1013 Epigastric pain: Secondary | ICD-10-CM | POA: Diagnosis present

## 2016-04-28 DIAGNOSIS — K2970 Gastritis, unspecified, without bleeding: Secondary | ICD-10-CM | POA: Diagnosis not present

## 2016-04-28 LAB — COMPREHENSIVE METABOLIC PANEL
ALBUMIN: 3.9 g/dL (ref 3.5–5.0)
ALK PHOS: 86 U/L (ref 38–126)
ALT: 16 U/L — ABNORMAL LOW (ref 17–63)
ANION GAP: 11 (ref 5–15)
AST: 18 U/L (ref 15–41)
BUN: 12 mg/dL (ref 6–20)
CO2: 26 mmol/L (ref 22–32)
Calcium: 10.4 mg/dL — ABNORMAL HIGH (ref 8.9–10.3)
Chloride: 101 mmol/L (ref 101–111)
Creatinine, Ser: 0.92 mg/dL (ref 0.61–1.24)
GFR calc Af Amer: >60 mL/min (ref >60)
GFR calc non Af Amer: >60 mL/min (ref >60)
GLUCOSE: 159 mg/dL — AB (ref 65–99)
POTASSIUM: 4.2 mmol/L (ref 3.5–5.1)
SODIUM: 138 mmol/L (ref 135–145)
Total Bilirubin: 0.5 mg/dL (ref 0.3–1.2)
Total Protein: 7.6 g/dL (ref 6.5–8.1)

## 2016-04-28 LAB — CBC
HEMATOCRIT: 43.7 % (ref 39.0–52.0)
HEMOGLOBIN: 14.4 g/dL (ref 13.0–17.0)
MCH: 23.9 pg — AB (ref 26.0–34.0)
MCHC: 33 g/dL (ref 30.0–36.0)
MCV: 72.5 fL — AB (ref 78.0–100.0)
Platelets: 271 10*3/uL (ref 150–400)
RBC: 6.03 MIL/uL — ABNORMAL HIGH (ref 4.22–5.81)
RDW: 14.1 % (ref 11.5–15.5)
WBC: 9.4 10*3/uL (ref 4.0–10.5)

## 2016-04-28 LAB — LIPASE, BLOOD: Lipase: 37 U/L (ref 11–51)

## 2016-04-28 MED ORDER — ONDANSETRON 4 MG PO TBDP: 4.0000 mg | ORAL_TABLET | Freq: Three times a day (TID) | ORAL | Status: DC | PRN

## 2016-04-28 MED ORDER — ONDANSETRON HCL 4 MG/2ML IJ SOLN
INTRAMUSCULAR | Status: AC
  Filled 2016-04-28: qty 2

## 2016-04-28 MED ORDER — RANITIDINE HCL 150 MG PO TABS: 150.0000 mg | ORAL_TABLET | Freq: Two times a day (BID) | ORAL | Status: DC

## 2016-04-28 MED ORDER — ONDANSETRON HCL 4 MG/2ML IJ SOLN
4.0000 mg | Freq: Once | INTRAMUSCULAR | Status: AC
  Administered 2016-04-28: 4 mg via INTRAVENOUS

## 2016-04-28 MED ORDER — GI COCKTAIL ~~LOC~~
30.0000 mL | Freq: Once | ORAL | Status: AC
  Administered 2016-04-28: 30 mL via ORAL
  Filled 2016-04-28: qty 30

## 2016-04-28 NOTE — ED Notes (Signed)
Patient is alert and orientedx4.  Patient was explained discharge instructions and they understood them with no questions.  The patient Donald Palmer is taking the patient home.

## 2016-04-28 NOTE — ED Notes (Signed)
Pt complaining of abdominal pain onset last Thursday. Pt complaining of N/V. Pt states he "hasn't be able to move his bowels.

## 2016-04-28 NOTE — ED Provider Notes (Signed)
CSN: [id number]     Arrival date & time 04/28/16  0019 History  By signing my name below, I, Donald Palmer, attest that this documentation has been prepared under the direction and in the presence of [name], MD. Electronically Signed: Irene Palmer, ED Scribe. 04/28/2016. 1:24 AM.    Chief Complaint  Patient presents with  . Abdominal Pain   Patient is a 55 y.o. male presenting with abdominal pain. The history is provided by the patient. No language interpreter was used.  Abdominal Pain Pain location:  Epigastric Pain quality: throbbing   Pain radiates to:  Does not radiate Pain severity:  Moderate Onset quality:  Gradual Duration:  1 week Timing:  Constant Progression:  Worsening Chronicity:  New Ineffective treatments:  None tried Associated symptoms: constipation, nausea and vomiting   Associated symptoms: no chills, no diarrhea and no fever   HPI Comments: Donald Palmer is a 55 y.o. male with a hx of DM, neuromuscular disorder, pancreatitis, HTN, and Hepatitis C who presents to the Emergency Department complaining of constant, gradually worsening, epigastric abdominal pain onset one week ago. Pt reports associated nausea, vomiting, and constipation. Pt states that he began having symptoms after eating a barbeque sandwich from [employer]. Pt was prescribed Percocet by his PCP for pain to no relief. He has also been taking Omeprazole to no relief. Pt states that his CBG levels are typically in the 120s-130s. He has not checked his A1C levels recently. Pt states that he drank one beer two days ago, but has not drank any other time since the pain began. He denies diarrhea, fever, or chills.   Past Medical History  Diagnosis Date  . Diabetes mellitus 2007  . Neuromuscular disorder ([place])   . Pancreatitis 01/09/2012  . Hypertension goal BP (blood pressure) < 140/80   . Hepatitis C     [id number]   Past Surgical History  Procedure Laterality Date  . Open reduction internal fixation  (orif) distal phalanx Right 04/02/2016    Procedure: RIGHT INDEX FINGER REPAIR;  Surgeon: [name], MD;  Location: [facility];  Service: Orthopedics;  Laterality: Right;   Family History  Problem Relation Age of Onset  . Diabetes Mother   . Hypertension Mother   . Hyperlipidemia Mother   . Diabetes Brother   . Hypertension Brother   . Heart attack Neg Hx   . Sudden death Neg Hx    Social History  Substance Use Topics  . Smoking status: Current Some Day Smoker -- 0.10 packs/day    Types: Cigarettes, Cigars    Last Attempt to Quit: 01/05/2012  . Smokeless tobacco: Never Used     Comment: 2 cigs/week  . Alcohol Use: 3.0 oz/week    5 Cans of beer per week    Review of Systems  Constitutional: Negative for fever and chills.  Gastrointestinal: Positive for nausea, vomiting, abdominal pain and constipation. Negative for diarrhea.  All other systems reviewed and are negative.  Allergies  Metformin and related  Home Medications   Prior to Admission medications   Medication Sig Start Date End Date Taking? Authorizing Provider  amoxicillin-clavulanate (AUGMENTIN) 875-125 MG tablet Take 1 tablet by mouth 2 (two) times daily. 03/26/16   [name], MD  baclofen (LIORESAL) 10 MG tablet Take 1 tablet (10 mg total) by mouth every 8 (eight) hours as needed for muscle spasms. 08/26/15   [name], MD  Blood Glucose Monitoring Suppl (ACCU-CHEK AVIVA PLUS) W/DEVICE KIT 1 each  by Does not apply route 2 (two) times daily. To check blood sugar twice daily. diag code E11.40. Non insulin dependent. Please check with pt that this is the equipment he needs. 09/08/15   [name], MD  celecoxib (CELEBREX) 200 MG capsule Take 1 capsule (200 mg total) by mouth daily. 03/01/16 03/01/17  [name], MD  clotrimazole-betamethasone (LOTRISONE) cream Apply 1 application topically 2 (two) times daily. 04/20/15   [name], DPM  fluticasone (FLONASE) 50 MCG/ACT  nasal spray Place 2 sprays into both nostrils daily. Patient taking differently: Place 2 sprays into both nostrils daily as needed for allergies.  05/24/15   [name], MD  gabapentin (NEURONTIN) 300 MG capsule Take 1 capsule (300 mg total) by mouth 3 (three) times daily. Patient taking differently: Take 1 capsule (300 mg total) by mouth 3 (three) times daily as needed for pain 03/19/16   [name], MD  glucose blood (FREESTYLE LITE) test strip Check blood sugar one time a day as instructed 02/10/16   [name], MD  Lancet Devices MISC 1 each by Does not apply route 2 (two) times daily. Please use to check blood sugar 2 times daily. diag code E11.40. noninsulin dependent 09/01/15   [name], MD  lisinopril (PRINIVIL,ZESTRIL) 20 MG tablet Take 1 tablet (20 mg total) by mouth daily. 01/06/16   [name], MD  menthol-zinc oxide (GOLD BOND) powder Apply 1 application topically daily as needed (for pain).    Historical Provider, MD  omeprazole (PRILOSEC) 20 MG capsule Take 1 capsule (20 mg total) by mouth daily. Patient taking differently: Take 1 capsule (20 mg total) by mouth daily as needed for stomach pains 03/19/16   [name], MD  oxyCODONE-acetaminophen (PERCOCET/ROXICET) 5-325 MG tablet Take 1-2 tablets by mouth every 4 (four) hours as needed for moderate pain or severe pain (surgery on 04-02-16.  Beginning on 04-06-16, limit to 6 tablets daily). 04/02/16   [name], MD  pioglitazone (ACTOS) 45 MG tablet Take 1 tablet (45 mg total) by mouth daily. 03/01/16   [name], MD  simvastatin (ZOCOR) 40 MG tablet Take 1 tablet (40 mg total) by mouth every evening. 01/06/16 01/05/17  [name], MD   BP 150/102 mmHg  Pulse 85  Temp(Src) 98.5 F (36.9 C) (Oral)  Resp 18  SpO2 99% Physical Exam  Constitutional: He is oriented to person, place, and time. He appears well-developed and well-nourished.  HENT:  Head: Normocephalic and  atraumatic.  Eyes: Conjunctivae and EOM are normal. Pupils are equal, round, and reactive to light.  Neck: Normal range of motion. Neck supple.  Cardiovascular: Normal rate, regular rhythm, normal heart sounds and intact distal pulses.  Exam reveals no gallop and no friction rub.   No murmur heard. Pulmonary/Chest: Effort normal and breath sounds normal. No respiratory distress. He has no wheezes.  Abdominal: Soft. Bowel sounds are normal. There is no tenderness.  Musculoskeletal: Normal range of motion.  Neurological: He is alert and oriented to person, place, and time.  Skin: Skin is warm and dry.  Psychiatric: He has a normal mood and affect. His behavior is normal.  Nursing note and vitals reviewed.   ED Course  Procedures (including critical care time) DIAGNOSTIC STUDIES: Oxygen Saturation is 99% on RA, normal by my interpretation.    COORDINATION OF CARE: 1:24 AM-Discussed treatment plan which includes labs with pt at bedside and pt agreed to plan.  Labs Review Labs Reviewed  COMPREHENSIVE METABOLIC PANEL - Abnormal; Notable for the following:    Glucose, Bld 159 (*)    Calcium 10.4 (*)    ALT 16 (*)    All other components within normal limits  CBC - Abnormal; Notable for the following:    RBC 6.03 (*)    MCV 72.5 (*)    MCH 23.9 (*)    All other components within normal limits  LIPASE, BLOOD    Imaging Review No results found. I have personally reviewed and evaluated these images and lab results as part of my medical decision-making.   EKG Interpretation None      MDM   Final diagnoses:  Gastroparesis due to secondary diabetes [facility])  Gastritis    55 y.o. male presents with recurrent upper abdominal pain that has been ongoing for a week but seems to be recurring intermittently. Pt is diabetic and suffers from neuropathy. Well appearing clinically despite history of vomiting and pain at home. No evidence of pancreatitis or life threatening cause of  abdominal pain currently. Suspect one of the above non-emergent diagnoses so provided meds for supportive care pending OP re-evaluation by PCP.    I personally performed the services described in this documentation, which was scribed in my presence. The recorded information has been reviewed and is accurate.        [name], MD 04/28/16 [phone]

## 2016-04-28 NOTE — Discharge Instructions (Signed)
Gastritis, Adult Gastritis is soreness and swelling (inflammation) of the lining of the stomach. Gastritis can develop as a sudden onset (acute) or long-term (chronic) condition. If gastritis is not treated, it can lead to stomach bleeding and ulcers. CAUSES  Gastritis occurs when the stomach lining is weak or damaged. Digestive juices from the stomach then inflame the weakened stomach lining. The stomach lining may be weak or damaged due to viral or bacterial infections. One common bacterial infection is the Helicobacter pylori infection. Gastritis can also result from excessive alcohol consumption, taking certain medicines, or having too much acid in the stomach.  SYMPTOMS  In some cases, there are no symptoms. When symptoms are present, they may include:  Pain or a burning sensation in the upper abdomen.  Nausea.  Vomiting.  An uncomfortable feeling of fullness after eating. DIAGNOSIS  Your caregiver may suspect you have gastritis based on your symptoms and a physical exam. To determine the cause of your gastritis, your caregiver may perform the following:  Blood or stool tests to check for the H pylori bacterium.  Gastroscopy. A thin, flexible tube (endoscope) is passed down the esophagus and into the stomach. The endoscope has a light and camera on the end. Your caregiver uses the endoscope to view the inside of the stomach.  Taking a tissue sample (biopsy) from the stomach to examine under a microscope. TREATMENT  Depending on the cause of your gastritis, medicines may be prescribed. If you have a bacterial infection, such as an H pylori infection, antibiotics may be given. If your gastritis is caused by too much acid in the stomach, H2 blockers or antacids may be given. Your caregiver may recommend that you stop taking aspirin, ibuprofen, or other nonsteroidal anti-inflammatory drugs (NSAIDs). HOME CARE INSTRUCTIONS  Only take over-the-counter or prescription medicines as directed by  your caregiver.  If you were given antibiotic medicines, take them as directed. Finish them even if you start to feel better.  Drink enough fluids to keep your urine clear or pale yellow.  Avoid foods and drinks that make your symptoms worse, such as:  Caffeine or alcoholic drinks.  Chocolate.  Peppermint or mint flavorings.  Garlic and onions.  Spicy foods.  Citrus fruits, such as oranges, lemons, or limes.  Tomato-based foods such as sauce, chili, salsa, and pizza.  Fried and fatty foods.  Eat small, frequent meals instead of large meals. SEEK IMMEDIATE MEDICAL CARE IF:   You have black or dark red stools.  You vomit blood or material that looks like coffee grounds.  You are unable to keep fluids down.  Your abdominal pain gets worse.  You have a fever.  You do not feel better after 1 week.  You have any other questions or concerns. MAKE SURE YOU:  Understand these instructions.  Will watch your condition.  Will get help right away if you are not doing well or get worse.   This information is not intended to replace advice given to you by your health care provider. Make sure you discuss any questions you have with your health care provider.   Document Released: 11/27/2001 Document Revised: 06/03/2012 Document Reviewed: 01/16/2012 Elsevier Interactive Patient Education 2016 [employer] Gastroparesis Gastroparesis, also called delayed gastric emptying, is a condition in which food takes longer than normal to empty from the stomach. The condition is usually long-lasting (chronic). CAUSES This condition may be caused by:  An endocrine disorder, such as hypothyroidism or diabetes. Diabetes is the most common cause  of this condition.  A nervous system disease, such as Parkinson disease or multiple sclerosis.  Cancer, infection, or surgery of the stomach or vagus nerve.  A connective tissue disorder, such as scleroderma.  Certain medicines. In most  cases, the cause is not known. RISK FACTORS This condition is more likely to develop in:  People with certain disorders, including endocrine disorders, eating disorders, amyloidosis, and scleroderma.  People with certain diseases, including Parkinson disease or multiple sclerosis.  People with cancer or infection of the stomach or vagus nerve.  People who have had surgery on the stomach or vagus nerve.  People who take certain medicines.  Women. SYMPTOMS Symptoms of this condition include:  An early feeling of fullness when eating.  Nausea.  Weight loss.  Vomiting.  Heartburn.  Abdominal bloating.  Inconsistent blood glucose levels.  Lack of appetite.  Acid from the stomach coming up into the esophagus (gastroesophageal reflux).  Spasms of the stomach. Symptoms may come and go. DIAGNOSIS This condition is diagnosed with tests, such as:  Tests that check how long it takes food to move through the stomach and intestines. These tests include:  Upper gastrointestinal (GI) series. In this test, X-rays of the intestines are taken after you drink a liquid. The liquid makes the intestines show up better on the X-rays.  Gastric emptying scintigraphy. In this test, scans are taken after you eat food that contains a small amount of radioactive material.  Wireless capsule GI monitoring system. This test involves swallowing a capsule that records information about movement through the stomach.  Gastric manometry. This test measures electrical and muscular activity in the stomach. It is done with a thin tube that is passed down the throat and into the stomach.  Endoscopy. This test checks for abnormalities in the lining of the stomach. It is done with a long, thin tube that is passed down the throat and into the stomach.  An ultrasound. This test can help rule out gallbladder disease or pancreatitis as a cause of your symptoms. It uses sound waves to take pictures of the inside  of your body. TREATMENT There is no cure for gastroparesis. This condition may be managed with:  Treatment of the underlying condition causing the gastroparesis.  Lifestyle changes, including exercise and dietary changes. Dietary changes can include:  Changes in what and when you eat.  Eating smaller meals more often.  Eating low-fat foods.  Eating low-fiber forms of high-fiber foods, such as cooked vegetables instead of raw vegetables.  Having liquid foods in place of solid foods. Liquid foods are easier to digest.  Medicines. These may be given to control nausea and vomiting and to stimulate stomach muscles.  Getting food through a feeding tube. This may be done in severe cases.  A gastric neurostimulator. This is a device that is inserted into the body with surgery. It helps improve stomach emptying and control nausea and vomiting. HOME CARE INSTRUCTIONS  Follow your health care provider's instructions about exercise and diet.  Take medicines only as directed by your health care provider. SEEK MEDICAL CARE IF:  Your symptoms do not improve with treatment.  You have new symptoms. SEEK IMMEDIATE MEDICAL CARE IF:  You have severe abdominal pain that does not improve with treatment.  You have nausea that does not go away.  You cannot keep fluids down.   This information is not intended to replace advice given to you by your health care provider. Make sure you discuss any questions you  have with your health care provider.   Document Released: 12/03/2005 Document Revised: 04/19/2015 Document Reviewed: 11/29/2014 Elsevier Interactive Patient Education [employer].

## 2016-05-02 DIAGNOSIS — S62620D Displaced fracture of medial phalanx of right index finger, subsequent encounter for fracture with routine healing: Secondary | ICD-10-CM | POA: Diagnosis not present

## 2016-05-02 DIAGNOSIS — S66120D Laceration of flexor muscle, fascia and tendon of right index finger at wrist and hand level, subsequent encounter: Secondary | ICD-10-CM | POA: Diagnosis not present

## 2016-05-02 DIAGNOSIS — S66300D Unspecified injury of extensor muscle, fascia and tendon of right index finger at wrist and hand level, subsequent encounter: Secondary | ICD-10-CM | POA: Diagnosis not present

## 2016-05-07 ENCOUNTER — Telehealth: Payer: Self-pay | Admitting: Dietician

## 2016-05-09 NOTE — Telephone Encounter (Signed)
Called to follow up on blood sugars: he reports his Blood sugar is good because he is eating right. Scheduled an appointment to follow up Medical Nutrition Therapy for 05/17/16.

## 2016-05-12 ENCOUNTER — Inpatient Hospital Stay (HOSPITAL_COMMUNITY)
Admission: RE | Admit: 2016-05-12 | Discharge: 2016-05-16 | DRG: 513 | Disposition: A | Discharge status: Home / Self Care | Payer: Commercial Managed Care - HMO | Source: Ambulatory Visit | Attending: Orthopedic Surgery | Admitting: Orthopedic Surgery

## 2016-05-12 ENCOUNTER — Encounter (HOSPITAL_COMMUNITY): Payer: Self-pay

## 2016-05-12 ENCOUNTER — Emergency Department (HOSPITAL_COMMUNITY): Payer: Commercial Managed Care - HMO

## 2016-05-12 ENCOUNTER — Encounter (HOSPITAL_COMMUNITY): Payer: Self-pay | Admitting: *Deleted

## 2016-05-12 ENCOUNTER — Emergency Department (HOSPITAL_COMMUNITY)
Admission: EM | Admit: 2016-05-12 | Discharge: 2016-05-12 | Disposition: A | Discharge status: Home / Self Care | Payer: Commercial Managed Care - HMO | Source: Home / Self Care | Attending: Emergency Medicine | Admitting: Emergency Medicine

## 2016-05-12 ENCOUNTER — Encounter: Payer: Self-pay | Admitting: Orthopedic Surgery

## 2016-05-12 DIAGNOSIS — E1140 Type 2 diabetes mellitus with diabetic neuropathy, unspecified: Secondary | ICD-10-CM | POA: Diagnosis present

## 2016-05-12 DIAGNOSIS — E1169 Type 2 diabetes mellitus with other specified complication: Secondary | ICD-10-CM | POA: Diagnosis present

## 2016-05-12 DIAGNOSIS — Y838 Other surgical procedures as the cause of abnormal reaction of the patient, or of later complication, without mention of misadventure at the time of the procedure: Secondary | ICD-10-CM | POA: Diagnosis present

## 2016-05-12 DIAGNOSIS — Z79899 Other long term (current) drug therapy: Secondary | ICD-10-CM

## 2016-05-12 DIAGNOSIS — M86141 Other acute osteomyelitis, right hand: Secondary | ICD-10-CM | POA: Diagnosis not present

## 2016-05-12 DIAGNOSIS — F17210 Nicotine dependence, cigarettes, uncomplicated: Secondary | ICD-10-CM | POA: Diagnosis present

## 2016-05-12 DIAGNOSIS — M009 Pyogenic arthritis, unspecified: Secondary | ICD-10-CM | POA: Insufficient documentation

## 2016-05-12 DIAGNOSIS — Z8619 Personal history of other infectious and parasitic diseases: Secondary | ICD-10-CM

## 2016-05-12 DIAGNOSIS — B9689 Other specified bacterial agents as the cause of diseases classified elsewhere: Secondary | ICD-10-CM | POA: Diagnosis not present

## 2016-05-12 DIAGNOSIS — Z89021 Acquired absence of right finger(s): Secondary | ICD-10-CM | POA: Diagnosis not present

## 2016-05-12 DIAGNOSIS — I10 Essential (primary) hypertension: Secondary | ICD-10-CM | POA: Insufficient documentation

## 2016-05-12 DIAGNOSIS — R208 Other disturbances of skin sensation: Secondary | ICD-10-CM | POA: Diagnosis not present

## 2016-05-12 DIAGNOSIS — M79641 Pain in right hand: Secondary | ICD-10-CM | POA: Diagnosis not present

## 2016-05-12 DIAGNOSIS — S61411D Laceration without foreign body of right hand, subsequent encounter: Secondary | ICD-10-CM | POA: Diagnosis not present

## 2016-05-12 DIAGNOSIS — M79644 Pain in right finger(s): Secondary | ICD-10-CM | POA: Diagnosis present

## 2016-05-12 DIAGNOSIS — E119 Type 2 diabetes mellitus without complications: Secondary | ICD-10-CM | POA: Insufficient documentation

## 2016-05-12 DIAGNOSIS — T8741 Infection of amputation stump, right upper extremity: Principal | ICD-10-CM | POA: Diagnosis present

## 2016-05-12 DIAGNOSIS — L03011 Cellulitis of right finger: Secondary | ICD-10-CM | POA: Diagnosis not present

## 2016-05-12 DIAGNOSIS — R52 Pain, unspecified: Secondary | ICD-10-CM

## 2016-05-12 DIAGNOSIS — L089 Local infection of the skin and subcutaneous tissue, unspecified: Secondary | ICD-10-CM | POA: Diagnosis not present

## 2016-05-12 DIAGNOSIS — Z9889 Other specified postprocedural states: Secondary | ICD-10-CM | POA: Diagnosis not present

## 2016-05-12 LAB — CBC WITH DIFFERENTIAL/PLATELET
BASOS ABS: 0.1 10*3/uL (ref 0.0–0.1)
BASOS PCT: 0 %
Basophils Absolute: 0 10*3/uL (ref 0.0–0.1)
Basophils Relative: 1 %
EOS PCT: 1 %
Eosinophils Absolute: 0.1 10*3/uL (ref 0.0–0.7)
Eosinophils Absolute: 0.1 10*3/uL (ref 0.0–0.7)
Eosinophils Relative: 1 %
HEMATOCRIT: 38.5 % — AB (ref 39.0–52.0)
HEMATOCRIT: 37.4 % — AB (ref 39.0–52.0)
HEMOGLOBIN: 12.2 g/dL — AB (ref 13.0–17.0)
HEMOGLOBIN: 11.5 g/dL — AB (ref 13.0–17.0)
LYMPHS ABS: 4.3 10*3/uL — AB (ref 0.7–4.0)
LYMPHS PCT: 45 %
LYMPHS PCT: 44 %
Lymphs Abs: 3.8 10*3/uL (ref 0.7–4.0)
MCH: 23 pg — ABNORMAL LOW (ref 26.0–34.0)
MCH: 22.2 pg — ABNORMAL LOW (ref 26.0–34.0)
MCHC: 31.7 g/dL (ref 30.0–36.0)
MCHC: 30.7 g/dL (ref 30.0–36.0)
MCV: 72.6 fL — AB (ref 78.0–100.0)
MCV: 72.2 fL — ABNORMAL LOW (ref 78.0–100.0)
MONOS PCT: 8 %
MONOS PCT: 6 %
Monocytes Absolute: 0.8 10*3/uL (ref 0.1–1.0)
Monocytes Absolute: 0.5 10*3/uL (ref 0.1–1.0)
NEUTROS ABS: 4.2 10*3/uL (ref 1.7–7.7)
NEUTROS ABS: 4.2 10*3/uL (ref 1.7–7.7)
NEUTROS PCT: 49 %
Neutrophils Relative %: 45 %
PLATELETS: 269 10*3/uL (ref 150–400)
Platelets: 292 10*3/uL (ref 150–400)
RBC: 5.3 MIL/uL (ref 4.22–5.81)
RBC: 5.18 MIL/uL (ref 4.22–5.81)
RDW: 14.5 % (ref 11.5–15.5)
RDW: 14.4 % (ref 11.5–15.5)
WBC: 9.5 10*3/uL (ref 4.0–10.5)
WBC: 8.6 10*3/uL (ref 4.0–10.5)

## 2016-05-12 LAB — BASIC METABOLIC PANEL
ANION GAP: 6 (ref 5–15)
BUN: 12 mg/dL (ref 6–20)
CALCIUM: 9.6 mg/dL (ref 8.9–10.3)
CHLORIDE: 105 mmol/L (ref 101–111)
CO2: 26 mmol/L (ref 22–32)
Creatinine, Ser: 0.88 mg/dL (ref 0.61–1.24)
GFR calc Af Amer: >60 mL/min (ref >60)
GFR calc non Af Amer: >60 mL/min (ref >60)
GLUCOSE: 154 mg/dL — AB (ref 65–99)
Potassium: 3.8 mmol/L (ref 3.5–5.1)
Sodium: 137 mmol/L (ref 135–145)

## 2016-05-12 LAB — GLUCOSE, CAPILLARY
Glucose-Capillary: 134 mg/dL — ABNORMAL HIGH (ref 65–99)
Glucose-Capillary: 123 mg/dL — ABNORMAL HIGH (ref 65–99)

## 2016-05-12 LAB — C-REACTIVE PROTEIN: CRP: 0.5 mg/dL (ref <1.0)

## 2016-05-12 LAB — SEDIMENTATION RATE: SED RATE: 12 mm/h (ref 0–16)

## 2016-05-12 MED ORDER — BACLOFEN 10 MG PO TABS
10.0000 mg | ORAL_TABLET | Freq: Three times a day (TID) | ORAL | Status: DC | PRN
  Administered 2016-05-13 – 2016-05-14 (×2): 10 mg via ORAL
  Filled 2016-05-12 (×2): qty 1

## 2016-05-12 MED ORDER — FAMOTIDINE 20 MG PO TABS
20.0000 mg | ORAL_TABLET | Freq: Two times a day (BID) | ORAL | Status: DC
Start: 2016-05-12 — End: 2016-05-16
  Administered 2016-05-12 – 2016-05-16 (×7): 20 mg via ORAL
  Filled 2016-05-12 (×7): qty 1

## 2016-05-12 MED ORDER — GABAPENTIN 300 MG PO CAPS
300.0000 mg | ORAL_CAPSULE | Freq: Three times a day (TID) | ORAL | Status: DC
  Administered 2016-05-12 – 2016-05-13 (×3): 300 mg via ORAL
  Filled 2016-05-12 (×3): qty 1

## 2016-05-12 MED ORDER — CELECOXIB 200 MG PO CAPS
200.0000 mg | ORAL_CAPSULE | Freq: Every day | ORAL | Status: DC
  Administered 2016-05-12 – 2016-05-16 (×4): 200 mg via ORAL
  Filled 2016-05-12 (×4): qty 1

## 2016-05-12 MED ORDER — INSULIN ASPART 100 UNIT/ML ~~LOC~~ SOLN
0.0000 [IU] | Freq: Three times a day (TID) | SUBCUTANEOUS | Status: DC
  Administered 2016-05-16: 3 [IU] via SUBCUTANEOUS

## 2016-05-12 MED ORDER — HYDROMORPHONE HCL 1 MG/ML IJ SOLN
1.0000 mg | Freq: Once | INTRAMUSCULAR | Status: AC
  Administered 2016-05-12: 1 mg via INTRAVENOUS
  Filled 2016-05-12: qty 1

## 2016-05-12 MED ORDER — SIMVASTATIN 40 MG PO TABS
40.0000 mg | ORAL_TABLET | Freq: Every evening | ORAL | Status: DC
  Administered 2016-05-12 – 2016-05-15 (×4): 40 mg via ORAL
  Filled 2016-05-12 (×4): qty 1

## 2016-05-12 MED ORDER — CEFAZOLIN SODIUM 1-5 GM-% IV SOLN
1.0000 g | Freq: Three times a day (TID) | INTRAVENOUS | Status: DC
  Administered 2016-05-12 – 2016-05-13 (×3): 1 g via INTRAVENOUS
  Filled 2016-05-12 (×5): qty 50

## 2016-05-12 MED ORDER — ONDANSETRON HCL 4 MG/2ML IJ SOLN: 4.0000 mg | Freq: Four times a day (QID) | INTRAMUSCULAR | Status: DC | PRN

## 2016-05-12 MED ORDER — KETOROLAC TROMETHAMINE 15 MG/ML IJ SOLN
15.0000 mg | Freq: Once | INTRAMUSCULAR | Status: AC
  Administered 2016-05-12: 15 mg via INTRAVENOUS
  Filled 2016-05-12: qty 1

## 2016-05-12 MED ORDER — OXYCODONE-ACETAMINOPHEN 5-325 MG PO TABS
1.0000 | ORAL_TABLET | ORAL | Status: DC | PRN
  Administered 2016-05-12 – 2016-05-16 (×10): 2 via ORAL
  Filled 2016-05-12 (×10): qty 2

## 2016-05-12 MED ORDER — PIOGLITAZONE HCL 45 MG PO TABS
45.0000 mg | ORAL_TABLET | Freq: Every day | ORAL | Status: DC
  Administered 2016-05-13 – 2016-05-14 (×2): 45 mg via ORAL
  Filled 2016-05-12 (×5): qty 1

## 2016-05-12 MED ORDER — SODIUM CHLORIDE 0.9 % IV SOLN
INTRAVENOUS | Status: DC
  Administered 2016-05-12 – 2016-05-15 (×2): via INTRAVENOUS

## 2016-05-12 MED ORDER — HYDROMORPHONE HCL 1 MG/ML IJ SOLN
0.5000 mg | INTRAMUSCULAR | Status: DC | PRN
  Administered 2016-05-12 – 2016-05-15 (×9): 1 mg via INTRAVENOUS
  Filled 2016-05-12 (×9): qty 1

## 2016-05-12 MED ORDER — ONDANSETRON HCL 4 MG PO TABS: 4.0000 mg | ORAL_TABLET | Freq: Four times a day (QID) | ORAL | Status: DC | PRN

## 2016-05-12 MED ORDER — LISINOPRIL 20 MG PO TABS
20.0000 mg | ORAL_TABLET | Freq: Every day | ORAL | Status: DC
  Administered 2016-05-12 – 2016-05-16 (×4): 20 mg via ORAL
  Filled 2016-05-12 (×4): qty 1

## 2016-05-12 MED ORDER — SODIUM CHLORIDE 0.9 % IV BOLUS (SEPSIS)
1000.0000 mL | Freq: Once | INTRAVENOUS | Status: AC
  Administered 2016-05-12: 1000 mL via INTRAVENOUS

## 2016-05-12 NOTE — Progress Notes (Addendum)
Pt arrived to the unit. Oriented to the unit. AAOx3-4 ambulatory. MD made aware awaiting orders.

## 2016-05-12 NOTE — ED Notes (Signed)
The pt reports that he had surgery on his rt index finger April 17th.  For the past 3 days he has had increasing pain

## 2016-05-12 NOTE — Discharge Instructions (Signed)
Please see Dr. Berenice Primas as requested..   Bone and Joint Infections, Adult Bone infections (osteomyelitis) and joint infections (septic arthritis) occur when bacteria or other germs get inside a bone or a joint. This can happen if you have an infection in another part of your body that spreads through your blood. Germs from your skin or from outside of your body can also cause this type of infection if you have a wound or a broken bone (fracture) that breaks the skin. Anyone can get a bone infection or joint infection. You may be more likely to get this type of infection if you have a condition, such as diabetes, that lowers your ability to fight infection or increases your chances of getting an infection. Bone and joint infections can cause damage, and they can spread to other areas of your body. They need to be treated quickly. CAUSES Most bone and joint infections are caused by bacteria. They can also be caused by other germs, such as viruses and funguses. RISK FACTORS This condition is more likely to develop in:  People who recently had surgery, especially bone or joint surgery.  People who have a long-term (chronic) disease, such as:  HIV (human immunodeficiency virus).  Diabetes.  Rheumatoid arthritis.  Sickle cell anemia.  Elderly people.  People who take medicines that block or weaken the body's defense system (immune system).  People who have a condition that reduces their blood flow.  People who are on kidney dialysis.  People who have an artificial joint.  People who have had a joint or bone repaired with plates or screws (surgical hardware).  People who use or abuse IV drugs.  People who have had trauma, such as stepping on a nail. SYMPTOMS Symptoms vary depending on the type and location of your infection. Common symptoms of bone and joint infections include:  Fever and chills.  Redness and warmth.  Swelling.  Pain and stiffness.  Drainage of fluid or pus  near the infection.  Weight loss and fatigue.  Decreased ability to use a hand or foot. DIAGNOSIS This condition may be diagnosed based on symptoms, medical history, a physical exam, and diagnostic tests. Tests can help to identify the cause of the infection. You may have various tests, such as:  A sample of tissue, fluid, or blood taken to be examined under a microscope.  A procedure to remove fluid from the infected joint with a needle (joint aspiration) for testing in a lab.  Pus or discharge swabbed from a wound for testing to identify germs and to determine what type of medicine will kill them (culture and sensitivity).  Blood tests to look for evidence of infection and inflammation (biomarkers).  Imaging studies to determine how severe the bone or joint infection is. These may include:  X-rays.  CT scan.  MRI.  Bone scan. TREATMENT Treatment depends on the cause and type of infection. Antibiotic medicines are usually the first treatment for a bone or joint infection. Treatment with antibiotics may include:  Getting IV antibiotics. This may be done in a hospital at first. You may have to continue IV antibiotics at home for several weeks. You may also have to take antibiotics by mouth for several weeks after that.  Taking more than one kind of antibiotic. Treatment may start with a type of antibiotic that works against many different bacteria (broad spectrumantibiotics). IV antibiotics may be changed if tests show that another type may work better. Other treatments may include:  Draining fluid  from the joint by placing a needle into it (aspiration).  Surgery to remove:  Dead or dying tissue from a bone or joint.  An infected artificial joint.  Infected plates or screws that were used to repair a broken bone. HOME CARE INSTRUCTIONS  Take medicines only as directed by your health care provider.  Take your antibiotic medicine as directed by your health care provider.  Finish the antibiotic even if you start to feel better.  Follow instructions from your health care provider about how to take IV antibiotics at home.  Ask your health care provider if you have any restrictions on your activities.  Keep all follow-up visits as directed by your health care provider. This is important. SEEK MEDICAL CARE IF:  You have a fever or chills.  You have redness, warmth, pain, or swelling that returns after treatment. SEEK IMMEDIATE MEDICAL CARE IF:  You have rapid breathing or you have trouble breathing.  You have chest pain.  You cannot drink fluids or make urine.  The affected arm or leg swells, changes color, or turns blue.   This information is not intended to replace advice given to you by your health care provider. Make sure you discuss any questions you have with your health care provider.   Document Released: 12/03/2005 Document Revised: 04/19/2015 Document Reviewed: 12/01/2014 Elsevier Interactive Patient Education [employer].

## 2016-05-12 NOTE — ED Provider Notes (Signed)
CSN: [id number]     Arrival date & time 05/12/16  0131 History  By signing my name below, I, Stephania Fragmin, attest that this documentation has been prepared under the direction and in the presence of Varney Biles, MD. Electronically Signed: Stephania Fragmin, ED Scribe. 05/12/2016. 4:11 AM.    Chief Complaint  Patient presents with  . Wound Check   The history is provided by the patient. No language interpreter was used.    HPI Comments: Donald Palmer is a 55 y.o. male with a history of right index finger repair (orif) performed by Dr. [name] on 04/02/16, DM, DM, neuromuscular disorder, pancreatitis, hypertension, and hepatitis C, who presents to the Emergency Department complaining of gradual-onset, constant, 9/10 right index finger pain that has worsened over the past week. He states the most pain is located on the finger on the area that was surgically operated on. He reports associated numbness and tingling to the finger tip, as well as swelling that has progressed distally. Patient states he has been taking Percocet with only mild relief. He states he had accidentally "tapped" his finger, which was wrapped, twice, but has not had any significant impact to his finger. Patient states he last had a therapy session 2 weeks ago. He states he was doing well s/p his surgery until 1 week ago. Patient states he is scheduled to have the pin from his surgery removed on 05/16/16.   ROS 10 Systems reviewed and are negative for acute change except as noted in the HPI.     Past Medical History  Diagnosis Date  . Diabetes mellitus 2007  . Neuromuscular disorder ([place])   . Pancreatitis 01/09/2012  . Hypertension goal BP (blood pressure) < 140/80   . Hepatitis C     [id number]   Past Surgical History  Procedure Laterality Date  . Open reduction internal fixation (orif) distal phalanx Right 04/02/2016    Procedure: RIGHT INDEX FINGER REPAIR;  Surgeon: [name], MD;  Location: [facility];  Service: Orthopedics;  Laterality: Right;   Family History  Problem Relation Age of Onset  . Diabetes Mother   . Hypertension Mother   . Hyperlipidemia Mother   . Diabetes Brother   . Hypertension Brother   . Heart attack Neg Hx   . Sudden death Neg Hx    Social History  Substance Use Topics  . Smoking status: Current Some Day Smoker -- 0.10 packs/day    Types: Cigarettes, Cigars    Last Attempt to Quit: 01/05/2012  . Smokeless tobacco: Never Used     Comment: 2 cigs/week  . Alcohol Use: 3.0 oz/week    5 Cans of beer per week    Review of Systems  Musculoskeletal: Positive for myalgias.  Neurological: Positive for numbness.  All other systems reviewed and are negative.     Allergies  Metformin and related  Home Medications   Prior to Admission medications   Medication Sig Start Date End Date Taking? Authorizing Provider  baclofen (LIORESAL) 10 MG tablet Take 1 tablet (10 mg total) by mouth every 8 (eight) hours as needed for muscle spasms. 08/26/15  Yes [name], MD  Blood Glucose Monitoring Suppl (ACCU-CHEK AVIVA PLUS) W/DEVICE KIT 1 each by Does not apply route 2 (two) times daily. To check blood sugar twice daily. diag code E11.40. Non insulin dependent. Please check with pt that this is the equipment he needs. 09/08/15  Yes [name], MD  celecoxib (CELEBREX)  200 MG capsule Take 1 capsule (200 mg total) by mouth daily. 03/01/16 03/01/17 Yes [name], MD  clotrimazole-betamethasone (LOTRISONE) cream Apply 1 application topically 2 (two) times daily. 04/20/15  Yes Viona Gilmore [place], DPM  fluticasone (FLONASE) 50 MCG/ACT nasal spray Place 2 sprays into both nostrils daily. Patient taking differently: Place 2 sprays into both nostrils daily as needed for allergies.  05/24/15  Yes [name], MD  gabapentin (NEURONTIN) 300 MG capsule Take 1 capsule (300 mg total) by mouth 3 (three) times daily. Patient taking differently: Take 1  capsule (300 mg total) by mouth 3 (three) times daily as needed for pain 03/19/16  Yes [name], MD  glucose blood (FREESTYLE LITE) test strip Check blood sugar one time a day as instructed 02/10/16  Yes [name], MD  Lancet Devices MISC 1 each by Does not apply route 2 (two) times daily. Please use to check blood sugar 2 times daily. diag code E11.40. noninsulin dependent 09/01/15  Yes [name], MD  lisinopril (PRINIVIL,ZESTRIL) 20 MG tablet Take 1 tablet (20 mg total) by mouth daily. 01/06/16  Yes [name], MD  menthol-zinc oxide (GOLD BOND) powder Apply 1 application topically daily as needed (for pain).   Yes Historical Provider, MD  omeprazole (PRILOSEC) 20 MG capsule Take 1 capsule (20 mg total) by mouth daily. Patient taking differently: Take 1 capsule (20 mg total) by mouth daily as needed for stomach pains 03/19/16  Yes [name], MD  ondansetron (ZOFRAN ODT) 4 MG disintegrating tablet Take 1 tablet (4 mg total) by mouth every 8 (eight) hours as needed for nausea or vomiting. 04/28/16  Yes [name], MD  oxyCODONE-acetaminophen (PERCOCET/ROXICET) 5-325 MG tablet Take 1-2 tablets by mouth every 4 (four) hours as needed for moderate pain or severe pain (surgery on 04-02-16.  Beginning on 04-06-16, limit to 6 tablets daily). 04/02/16  Yes [name], MD  pioglitazone (ACTOS) 45 MG tablet Take 1 tablet (45 mg total) by mouth daily. 03/01/16  Yes [name], MD  ranitidine (ZANTAC) 150 MG tablet Take 1 tablet (150 mg total) by mouth 2 (two) times daily. 04/28/16  Yes [name], MD  simvastatin (ZOCOR) 40 MG tablet Take 1 tablet (40 mg total) by mouth every evening. 01/06/16 01/05/17 Yes [name], MD   BP 139/86 mmHg  Pulse 67  Temp(Src) 97.7 F (36.5 C) (Oral)  Resp 18  SpO2 98% Physical Exam  Constitutional: He is oriented to person, place, and time. He appears well-developed and well-nourished. No distress.  HENT:  Head:  Normocephalic and atraumatic.  Eyes: Conjunctivae and EOM are normal.  Neck: Neck supple. No tracheal deviation present.  Cardiovascular: Normal rate.   Pulmonary/Chest: Effort normal. No respiratory distress.  Musculoskeletal: Normal range of motion.  Right index finger has gross swelling. There is a pin on the radial side of the index finger, distal to DIP. Pt has slight exoriation of the skin of the index finger on the dorsal and radial side, without any purulent drainage. Pt has subjective numbness to the index finger, worse on the distal side, where he is not able to differentiate between sharp and dull. No significant tenderness with passive ROM of the index finger, the IP joint ROM is compromised secondary to surgical repair.   Neurological: He is alert and oriented to person, place, and time.  Skin: Skin is warm and dry.  Psychiatric: He has a normal mood and affect. His  behavior is normal.  Nursing note and vitals reviewed.   ED Course  Procedures (including critical care time)  DIAGNOSTIC STUDIES: Oxygen Saturation is 100% on RA, normal by my interpretation.    COORDINATION OF CARE: 3:46 AM - Pt made aware of XR result. Discussed treatment plan with pt at bedside. Pt verbalized understanding and agreed to plan.   @5 :00: Spoke with Dr. Berenice Primas, Orthopedic service. We discussed the nature of Ms. Dowland injury, the surgery, the symptomology and the Xrays and Physical exam findings. Dr. Berenice Primas wants patient to be seen in the clinic this morning. I asked him about the need for admission and iv antibiotics, and Dr. Berenice Primas reported that they might need to admit the patient anyways, but he would like to see the patient, remove the pin possibly and discuss the case with Dr. Grandville Silos, the original surgeon. Pt aware of the diagnosis and the plan. Refuses digital block - so will give iv meds and d.c. He is to stay NPO, and he is aware.   Labs Review Labs Reviewed  CBC WITH  DIFFERENTIAL/PLATELET - Abnormal; Notable for the following:    Hemoglobin 12.2 (*)    HCT 38.5 (*)    MCV 72.6 (*)    MCH 23.0 (*)    Lymphs Abs 4.3 (*)    All other components within normal limits  BASIC METABOLIC PANEL - Abnormal; Notable for the following:    Glucose, Bld 154 (*)    All other components within normal limits    Imaging Review Dg Hand Complete Right  05/12/2016  CLINICAL DATA:  Acute onset of worsening right index finger pain and swelling, with drainage. Initial encounter. EXAM: RIGHT HAND - COMPLETE 3+ VIEW COMPARISON:  Right index finger radiographs performed 03/26/2016 FINDINGS: There is lucency about the distal aspect of the second middle phalanx, at the site of the patient's pin, compatible with osteomyelitis. Associated diffuse soft tissue swelling is noted about the second digit. There is widening of the second distal interphalangeal joint, which likely reflects underlying joint infection. Remaining visualized joint spaces are grossly preserved. There is chronic deformity of the distal fifth metacarpal. IMPRESSION: Lucency noted at the distal aspect of the second middle phalanx, at the site of the patient's pain, likely reflecting osteomyelitis. Associated diffuse soft tissue swelling about the second digit. Widening of the second distal interphalangeal joint likely reflects underlying joint infection. Electronically Signed   By: Garald Balding M.D.   On: 05/12/2016 02:18   I have personally reviewed and evaluated these images and lab results as part of my medical decision-making.   EKG Interpretation None      MDM   Final diagnoses:  Acute osteomyelitis of right hand (HCC)  Pyogenic arthritis of right hand, due to unspecified organism [facility])    I personally performed the services described in this documentation, which was scribed in my presence. The recorded information has been reviewed and is accurate.   Pt with finger pain and swelling - xrays show osteo  and possible septic joint. Still has hardware in. Not septic, no systemic signs. Will page ortho. Pt had open distal phalanx fracture that was repaired last month.     Varney Biles, MD 05/12/16 [phone]

## 2016-05-13 DIAGNOSIS — E1140 Type 2 diabetes mellitus with diabetic neuropathy, unspecified: Secondary | ICD-10-CM

## 2016-05-13 LAB — GLUCOSE, CAPILLARY
Glucose-Capillary: 109 mg/dL — ABNORMAL HIGH (ref 65–99)
Glucose-Capillary: 142 mg/dL — ABNORMAL HIGH (ref 65–99)
Glucose-Capillary: 189 mg/dL — ABNORMAL HIGH (ref 65–99)

## 2016-05-13 MED ORDER — DEXTROSE 5 % IV SOLN
2.0000 g | INTRAVENOUS | Status: DC
  Administered 2016-05-13 – 2016-05-15 (×3): 2 g via INTRAVENOUS
  Filled 2016-05-13 (×4): qty 2

## 2016-05-13 MED ORDER — DEXTROSE 5 % IV SOLN: 2.0000 g | INTRAVENOUS | Status: DC

## 2016-05-13 MED ORDER — VANCOMYCIN HCL IN DEXTROSE 1-5 GM/200ML-% IV SOLN
1000.0000 mg | Freq: Three times a day (TID) | INTRAVENOUS | Status: DC
  Administered 2016-05-13 – 2016-05-16 (×8): 1000 mg via INTRAVENOUS
  Filled 2016-05-13 (×12): qty 200

## 2016-05-13 NOTE — Consult Note (Addendum)
[facility] for Infectious Disease  Date of Admission:  05/12/2016  Date of Consult:  05/13/2016  Reason for Consult: R 2nd digit infection Referring Physician: Berenice Primas  Impression/Recommendation R second digit osteomyelitis Change his atbx to vanco, ceftriaxone Await Cx fro Dr Berenice Primas office Hand to see  He had Tdap in 2013  Dm2 with neuropathy Check A1C  Hep C  Cured  Check HIV  Thank you so much for this interesting consult,   Bobby Rumpf (pager) [phone] [email or web address]  Donald Palmer is an 55 y.o. male.  HPI: 55 yo M with DM2 (with neuropathy, since 2007), Hep C ([facility] Dr Linus Salmons [facility]),  previous R index finger fracture, laceration of tendon while repairing a lawn mower. He underwent ORIF on 4-17.  He returns today with worsening pain and d/c from his wound. He was seen in ortho office and had minimal drainage, no pus on attempted aspirate.  In ED he had plain film suggesting osteomyelitis of his finger.   Past Medical History  Diagnosis Date  . Diabetes mellitus 2007  . Neuromuscular disorder ([place])   . Pancreatitis 01/09/2012  . Hypertension goal BP (blood pressure) < 140/80   . Hepatitis C     [id number]    Past Surgical History  Procedure Laterality Date  . Open reduction internal fixation (orif) distal phalanx Right 04/02/2016    Procedure: RIGHT INDEX FINGER REPAIR;  Surgeon: [name], MD;  Location: [facility];  Service: Orthopedics;  Laterality: Right;     Allergies  Allergen Reactions  . Metformin And Related Other (See Comments)    GI side effects. Stopped 2016    Medications:  Scheduled: .  ceFAZolin (ANCEF) IV  1 g Intravenous Q8H  . celecoxib  200 mg Oral Daily  . famotidine  20 mg Oral BID  . insulin aspart  0-15 Units Subcutaneous TID WC  . lisinopril  20 mg Oral Daily  . pioglitazone  45 mg Oral Q breakfast  . simvastatin  40 mg Oral QPM    Abtx:  Anti-infectives    Start     Dose/Rate  Route Frequency Ordered Stop   05/12/16 1700  ceFAZolin (ANCEF) IVPB 1 g/50 mL premix     1 g 100 mL/hr over 30 Minutes Intravenous Every 8 hours 05/12/16 1538        Total days of antibiotics: 1 ancef          Social History:  reports that he has been smoking Cigarettes and Cigars.  He has been smoking about 0.10 packs per day. He has never used smokeless tobacco. He reports that he drinks about 3.0 oz of alcohol per week. He reports that he does not use illicit drugs.  Family History  Problem Relation Age of Onset  . Diabetes Mother   . Hypertension Mother   . Hyperlipidemia Mother   . Diabetes Brother   . Hypertension Brother   . Heart attack Neg Hx   . Sudden death Neg Hx     General ROS: no vision change, no change in wt, no dysphagia, FSG at home 120-130, normal Bm, normal urine, + neuropathy, no proxiaml erythema or swelling. continuned hand pain. see HPI  Blood pressure 122/83, pulse 72, temperature 97.8 F (36.6 C), temperature source Oral, resp. rate 16, SpO2 100 %. General appearance: alert, cooperative and no distress Eyes: negative findings: conjunctivae and sclerae normal and pupils equal, round, reactive to light and accomodation Throat: normal findings:  oropharynx pink & moist without lesions or evidence of thrush Neck: no adenopathy and supple, symmetrical, trachea midline Lungs: clear to auscultation bilaterally Heart: regular rate and rhythm Abdomen: normal findings: bowel sounds normal and soft, non-tender Extremities: edema none and R 2nd digit wrapped. no proximal erythema or swelling.  Neurologic: Sensory: grossly normal light touch BLE.   No diabetic foot lesions   Results for orders placed or performed during the hospital encounter of 05/12/16 (from the past 48 hour(s))  CBC WITH DIFFERENTIAL     Status: Abnormal   Collection Time: 05/12/16  5:28 PM  Result Value Ref Range   WBC 8.6 4.0 - 10.5 K/uL   RBC 5.18 4.22 - 5.81 MIL/uL   Hemoglobin 11.5  (L) 13.0 - 17.0 g/dL   HCT 37.4 (L) 39.0 - 52.0 %   MCV 72.2 (L) 78.0 - 100.0 fL   MCH 22.2 (L) 26.0 - 34.0 pg   MCHC 30.7 30.0 - 36.0 g/dL   RDW 14.4 11.5 - 15.5 %   Platelets 269 150 - 400 K/uL   Neutrophils Relative % 49 %   Neutro Abs 4.2 1.7 - 7.7 K/uL   Lymphocytes Relative 44 %   Lymphs Abs 3.8 0.7 - 4.0 K/uL   Monocytes Relative 6 %   Monocytes Absolute 0.5 0.1 - 1.0 K/uL   Eosinophils Relative 1 %   Eosinophils Absolute 0.1 0.0 - 0.7 K/uL   Basophils Relative 0 %   Basophils Absolute 0.0 0.0 - 0.1 K/uL  Sedimentation rate     Status: None   Collection Time: 05/12/16  5:28 PM  Result Value Ref Range   Sed Rate 12 0 - 16 mm/hr  C-reactive protein     Status: None   Collection Time: 05/12/16  5:28 PM  Result Value Ref Range   CRP 0.5 <1.0 mg/dL  Glucose, capillary     Status: Abnormal   Collection Time: 05/12/16  6:02 PM  Result Value Ref Range   Glucose-Capillary 134 (H) 65 - 99 mg/dL  Glucose, capillary     Status: Abnormal   Collection Time: 05/12/16 10:10 PM  Result Value Ref Range   Glucose-Capillary 123 (H) 65 - 99 mg/dL  Glucose, capillary     Status: Abnormal   Collection Time: 05/13/16  6:30 AM  Result Value Ref Range   Glucose-Capillary 109 (H) 65 - 99 mg/dL  Glucose, capillary     Status: Abnormal   Collection Time: 05/13/16 11:40 AM  Result Value Ref Range   Glucose-Capillary 142 (H) 65 - 99 mg/dL   No results found for: SDES, SPECREQUEST, CULT, REPTSTATUS Dg Hand Complete Right  05/12/2016  CLINICAL DATA:  Acute onset of worsening right index finger pain and swelling, with drainage. Initial encounter. EXAM: RIGHT HAND - COMPLETE 3+ VIEW COMPARISON:  Right index finger radiographs performed 03/26/2016 FINDINGS: There is lucency about the distal aspect of the second middle phalanx, at the site of the patient's pin, compatible with osteomyelitis. Associated diffuse soft tissue swelling is noted about the second digit. There is widening of the second distal  interphalangeal joint, which likely reflects underlying joint infection. Remaining visualized joint spaces are grossly preserved. There is chronic deformity of the distal fifth metacarpal. IMPRESSION: Lucency noted at the distal aspect of the second middle phalanx, at the site of the patient's pain, likely reflecting osteomyelitis. Associated diffuse soft tissue swelling about the second digit. Widening of the second distal interphalangeal joint likely reflects underlying joint infection. Electronically Signed  By: Garald Balding M.D.   On: 05/12/2016 02:18   No results found for this or any previous visit (from the past 240 hour(s)).    05/13/2016, 3:09 PM     LOS: 1 day    Records and images were personally reviewed where available.

## 2016-05-13 NOTE — Progress Notes (Signed)
Pharmacy Antibiotic Note  Donald Palmer is a 55 y.o. male admitted on 05/12/2016 with osteo.  Pharmacy has been consulted for vancomycin and ceftriaxone dosing.  Plan: Vancomycin 1 g IV every 8 hours.  Goal trough 15-20 mcg/mL. CTX 2 g q24h     Temp (24hrs), Avg:98.2 F (36.8 C), Min:97.8 F (36.6 C), Max:98.6 F (37 C)   Recent Labs Lab 05/12/16 0153 05/12/16 1728  WBC 9.5 8.6  CREATININE 0.88  --     CrCl cannot be calculated (Unknown ideal weight.).    Allergies  Allergen Reactions  . Metformin And Related Other (See Comments)    GI side effects. Stopped 2016   Levester Fresh, PharmD, BCPS, [facility] Clinical Pharmacist Pager [phone] 05/13/2016 3:39 PM

## 2016-05-13 NOTE — Progress Notes (Addendum)
Subjective: The patient complains of pain in his right index finger. Last night he requested IV narcotics for pain management.  Objective: Vital signs in last 24 hours: Temp:  [97.8 F (36.6 C)-98.6 F (37 C)] 97.8 F (36.6 C) (05/28 0534) Pulse Rate:  [72-75] 72 (05/28 0534) Resp:  [16] 16 (05/28 0534) BP: (122-131)/(65-83) 122/83 mmHg (05/28 0534) SpO2:  [100 %] 100 % (05/28 0534)  Intake/Output from previous day:   Intake/Output this shift: Total I/O In: 120 [P.O.:120] Out: -  Recent Results (from the past 2160 hour(s))  Glucose, capillary     Status: Abnormal   Collection Time: 03/01/16  9:18 AM  Result Value Ref Range   Glucose-Capillary 153 (H) 65 - 99 mg/dL  POCT HgB A1C (CPT 83036)     Status: None   Collection Time: 03/01/16  9:30 AM  Result Value Ref Range   Hemoglobin A1C 9.2   Basic metabolic panel     Status: Abnormal   Collection Time: 03/26/16  3:45 PM  Result Value Ref Range   Sodium 137 135 - 145 mmol/L   Potassium 3.9 3.5 - 5.1 mmol/L   Chloride 105 101 - 111 mmol/L   CO2 25 22 - 32 mmol/L   Glucose, Bld 140 (H) 65 - 99 mg/dL   BUN 15 6 - 20 mg/dL   Creatinine, Ser 0.96 0.61 - 1.24 mg/dL   Calcium 9.2 8.9 - 10.3 mg/dL   GFR calc non Af Amer >60 >60 mL/min   GFR calc Af Amer >60 >60 mL/min    Comment: (NOTE) The eGFR has been calculated using the CKD EPI equation. This calculation has not been validated in all clinical situations. eGFR's persistently <60 mL/min signify possible Chronic Kidney Disease.    Anion gap 7 5 - 15  CBC with Differential     Status: Abnormal   Collection Time: 03/26/16  3:45 PM  Result Value Ref Range   WBC 6.5 4.0 - 10.5 K/uL   RBC 5.08 4.22 - 5.81 MIL/uL   Hemoglobin 11.9 (L) 13.0 - 17.0 g/dL   HCT 36.8 (L) 39.0 - 52.0 %   MCV 72.4 (L) 78.0 - 100.0 fL   MCH 23.4 (L) 26.0 - 34.0 pg   MCHC 32.3 30.0 - 36.0 g/dL   RDW 14.3 11.5 - 15.5 %   Platelets 205 150 - 400 K/uL   Neutrophils Relative % 45 %   Lymphocytes  Relative 46 %   Monocytes Relative 7 %   Eosinophils Relative 1 %   Basophils Relative 1 %   Neutro Abs 2.9 1.7 - 7.7 K/uL   Lymphs Abs 2.9 0.7 - 4.0 K/uL   Monocytes Absolute 0.5 0.1 - 1.0 K/uL   Eosinophils Absolute 0.1 0.0 - 0.7 K/uL   Basophils Absolute 0.1 0.0 - 0.1 K/uL   Smear Review MORPHOLOGY UNREMARKABLE   Glucose, capillary     Status: Abnormal   Collection Time: 04/02/16  7:29 AM  Result Value Ref Range   Glucose-Capillary 147 (H) 65 - 99 mg/dL  Glucose, capillary     Status: Abnormal   Collection Time: 04/02/16  9:04 AM  Result Value Ref Range   Glucose-Capillary 135 (H) 65 - 99 mg/dL  Lipase, blood     Status: None   Collection Time: 04/28/16 12:42 AM  Result Value Ref Range   Lipase 37 11 - 51 U/L  Comprehensive metabolic panel     Status: Abnormal   Collection Time: 04/28/16 12:42 AM  Result Value Ref Range   Sodium 138 135 - 145 mmol/L   Potassium 4.2 3.5 - 5.1 mmol/L   Chloride 101 101 - 111 mmol/L   CO2 26 22 - 32 mmol/L   Glucose, Bld 159 (H) 65 - 99 mg/dL   BUN 12 6 - 20 mg/dL   Creatinine, Ser 0.92 0.61 - 1.24 mg/dL   Calcium 10.4 (H) 8.9 - 10.3 mg/dL   Total Protein 7.6 6.5 - 8.1 g/dL   Albumin 3.9 3.5 - 5.0 g/dL   AST 18 15 - 41 U/L   ALT 16 (L) 17 - 63 U/L   Alkaline Phosphatase 86 38 - 126 U/L   Total Bilirubin 0.5 0.3 - 1.2 mg/dL   GFR calc non Af Amer >60 >60 mL/min   GFR calc Af Amer >60 >60 mL/min    Comment: (NOTE) The eGFR has been calculated using the CKD EPI equation. This calculation has not been validated in all clinical situations. eGFR's persistently <60 mL/min signify possible Chronic Kidney Disease.    Anion gap 11 5 - 15  CBC     Status: Abnormal   Collection Time: 04/28/16 12:42 AM  Result Value Ref Range   WBC 9.4 4.0 - 10.5 K/uL   RBC 6.03 (H) 4.22 - 5.81 MIL/uL   Hemoglobin 14.4 13.0 - 17.0 g/dL   HCT 43.7 39.0 - 52.0 %   MCV 72.5 (L) 78.0 - 100.0 fL   MCH 23.9 (L) 26.0 - 34.0 pg   MCHC 33.0 30.0 - 36.0 g/dL   RDW  14.1 11.5 - 15.5 %   Platelets 271 150 - 400 K/uL  CBC with Differential     Status: Abnormal   Collection Time: 05/12/16  1:53 AM  Result Value Ref Range   WBC 9.5 4.0 - 10.5 K/uL   RBC 5.30 4.22 - 5.81 MIL/uL   Hemoglobin 12.2 (L) 13.0 - 17.0 g/dL   HCT 38.5 (L) 39.0 - 52.0 %   MCV 72.6 (L) 78.0 - 100.0 fL   MCH 23.0 (L) 26.0 - 34.0 pg   MCHC 31.7 30.0 - 36.0 g/dL   RDW 14.5 11.5 - 15.5 %   Platelets 292 150 - 400 K/uL   Neutrophils Relative % 45 %   Lymphocytes Relative 45 %   Monocytes Relative 8 %   Eosinophils Relative 1 %   Basophils Relative 1 %   Neutro Abs 4.2 1.7 - 7.7 K/uL   Lymphs Abs 4.3 (H) 0.7 - 4.0 K/uL   Monocytes Absolute 0.8 0.1 - 1.0 K/uL   Eosinophils Absolute 0.1 0.0 - 0.7 K/uL   Basophils Absolute 0.1 0.0 - 0.1 K/uL   WBC Morphology ATYPICAL LYMPHOCYTES   Basic metabolic panel     Status: Abnormal   Collection Time: 05/12/16  1:53 AM  Result Value Ref Range   Sodium 137 135 - 145 mmol/L   Potassium 3.8 3.5 - 5.1 mmol/L   Chloride 105 101 - 111 mmol/L   CO2 26 22 - 32 mmol/L   Glucose, Bld 154 (H) 65 - 99 mg/dL   BUN 12 6 - 20 mg/dL   Creatinine, Ser 0.88 0.61 - 1.24 mg/dL   Calcium 9.6 8.9 - 10.3 mg/dL   GFR calc non Af Amer >60 >60 mL/min   GFR calc Af Amer >60 >60 mL/min    Comment: (NOTE) The eGFR has been calculated using the CKD EPI equation. This calculation has not been validated in all clinical situations. eGFR's  persistently <60 mL/min signify possible Chronic Kidney Disease.    Anion gap 6 5 - 15  CBC WITH DIFFERENTIAL     Status: Abnormal   Collection Time: 05/12/16  5:28 PM  Result Value Ref Range   WBC 8.6 4.0 - 10.5 K/uL   RBC 5.18 4.22 - 5.81 MIL/uL   Hemoglobin 11.5 (L) 13.0 - 17.0 g/dL   HCT 37.4 (L) 39.0 - 52.0 %   MCV 72.2 (L) 78.0 - 100.0 fL   MCH 22.2 (L) 26.0 - 34.0 pg   MCHC 30.7 30.0 - 36.0 g/dL   RDW 14.4 11.5 - 15.5 %   Platelets 269 150 - 400 K/uL   Neutrophils Relative % 49 %   Neutro Abs 4.2 1.7 - 7.7 K/uL    Lymphocytes Relative 44 %   Lymphs Abs 3.8 0.7 - 4.0 K/uL   Monocytes Relative 6 %   Monocytes Absolute 0.5 0.1 - 1.0 K/uL   Eosinophils Relative 1 %   Eosinophils Absolute 0.1 0.0 - 0.7 K/uL   Basophils Relative 0 %   Basophils Absolute 0.0 0.0 - 0.1 K/uL  Sedimentation rate     Status: None   Collection Time: 05/12/16  5:28 PM  Result Value Ref Range   Sed Rate 12 0 - 16 mm/hr  C-reactive protein     Status: None   Collection Time: 05/12/16  5:28 PM  Result Value Ref Range   CRP 0.5 <1.0 mg/dL  Glucose, capillary     Status: Abnormal   Collection Time: 05/12/16  6:02 PM  Result Value Ref Range   Glucose-Capillary 134 (H) 65 - 99 mg/dL  Glucose, capillary     Status: Abnormal   Collection Time: 05/12/16 10:10 PM  Result Value Ref Range   Glucose-Capillary 123 (H) 65 - 99 mg/dL  Glucose, capillary     Status: Abnormal   Collection Time: 05/13/16  6:30 AM  Result Value Ref Range   Glucose-Capillary 109 (H) 65 - 99 mg/dL  Glucose, capillary     Status: Abnormal   Collection Time: 05/13/16 11:40 AM  Result Value Ref Range   Glucose-Capillary 142 (H) 65 - 99 mg/dL   Sedimentation rate 12 mm CRP 0.5. Awaiting Gram stain results and cultures from office cultures yesterday.  Physical examination: Right index finger examination: Dressing clean and dry. No redness or streaking up the hand. Still has tenderness at the tip of the finger.  Assessment/Plan: Right index finger infection status post fracture repair with repair of extensor and flexor tendons at the DIP joint approximately 6 weeks ago. Diabetes mellitus. Plan: Continue IV Ancef. Await cultures taken as an outpatient yesterday. Will follow-up on them. Infectious disease consult for recommendations regarding antibiotics.    Keeler G 05/13/2016, 2:44 PM

## 2016-05-13 NOTE — H&P (Signed)
Donald Palmer is an 55 y.o. male.  HPI: the patient is a 55 year old otherwise healthy male who underwent repair of extensor and flexor tendons at the DIP joint approximately 6 weeks ago. He presented to the emergency room last night with severe worsening pain and drainage. X-ray showed that the internal fixation had failed and the joint appeared to be gapped open. He presented to my office this morning with increasing pain and swelling in the finger. In the emergency room the previous night his  Temperature was normal. His white count was normal. Pain was his biggest complaint. In the office pain continue to be his biggest complaint. There is no frank drainage from the wound. I spoke with Dr. Grandville Silos and he has not both felt that the proper course of action will be to pull the pad and see if he can get any kind of pus from this area. I then attempted an aspiration of the joint and got no frank pus. At that point I felt the appropriate course of action would be to admit him for IV antibiotics and elevation and send the scant drainage that came from the pinhole and aspiration to the lab and have infectious disease see him about the possibility of IV antibiotics and duration of antibiotics. Dr. Grandville Silos will see him ultimately in the hospital to make plans about whether debridement will be necessary on this admission.  Past Medical History  Diagnosis Date  . Diabetes mellitus 2007  . Neuromuscular disorder ([place])   . Pancreatitis 01/09/2012  . Hypertension goal BP (blood pressure) < 140/80   . Hepatitis C     [id number]    Past Surgical History  Procedure Laterality Date  . Open reduction internal fixation (orif) distal phalanx Right 04/02/2016    Procedure: RIGHT INDEX FINGER REPAIR;  Surgeon: [name], MD;  Location: [facility];  Service: Orthopedics;  Laterality: Right;    Family History  Problem Relation Age of Onset  . Diabetes Mother   . Hypertension Mother   .  Hyperlipidemia Mother   . Diabetes Brother   . Hypertension Brother   . Heart attack Neg Hx   . Sudden death Neg Hx     Social History:  reports that he has been smoking Cigarettes and Cigars.  He has been smoking about 0.10 packs per day. He has never used smokeless tobacco. He reports that he drinks about 3.0 oz of alcohol per week. He reports that he does not use illicit drugs.  Allergies:  Allergies  Allergen Reactions  . Metformin And Related Other (See Comments)    GI side effects. Stopped 2016    Medications:  Prior to Admission:  Prescriptions prior to admission  Medication Sig Dispense Refill Last Dose  . baclofen (LIORESAL) 10 MG tablet Take 1 tablet (10 mg total) by mouth every 8 (eight) hours as needed for muscle spasms. 60 each 3 Past Week at Unknown time  . celecoxib (CELEBREX) 200 MG capsule Take 1 capsule (200 mg total) by mouth daily. 30 capsule 5 05/12/2016 at Unknown time  . fluticasone (FLONASE) 50 MCG/ACT nasal spray Place 2 sprays into both nostrils daily. (Patient taking differently: Place 2 sprays into both nostrils daily as needed for allergies. ) 16 g 11 Past Week at Unknown time  . lisinopril (PRINIVIL,ZESTRIL) 20 MG tablet Take 1 tablet (20 mg total) by mouth daily. 30 tablet 11 05/12/2016 at Unknown time  . menthol-zinc oxide (GOLD BOND) powder Apply 1 application  topically daily as needed (for pain).   Past Week at Unknown time  . pioglitazone (ACTOS) 45 MG tablet Take 1 tablet (45 mg total) by mouth daily. 30 tablet 11 05/12/2016 at Unknown time  . ranitidine (ZANTAC) 150 MG tablet Take 1 tablet (150 mg total) by mouth 2 (two) times daily. 60 tablet 0 05/12/2016 at Unknown time  . simvastatin (ZOCOR) 40 MG tablet Take 1 tablet (40 mg total) by mouth every evening. 30 tablet 11 05/12/2016 at Unknown time  . Blood Glucose Monitoring Suppl (ACCU-CHEK AVIVA PLUS) W/DEVICE KIT 1 each by Does not apply route 2 (two) times daily. To check blood sugar twice daily. diag  code E11.40. Non insulin dependent. Please check with pt that this is the equipment he needs. 1 kit 0 05/11/2016 at Unknown time  . clotrimazole-betamethasone (LOTRISONE) cream Apply 1 application topically 2 (two) times daily. (Patient not taking: Reported on 05/12/2016) 45 g 2 Not Taking at Unknown time  . gabapentin (NEURONTIN) 300 MG capsule Take 1 capsule (300 mg total) by mouth 3 (three) times daily. (Patient not taking: Reported on 05/12/2016) 90 capsule 11 Not Taking at Unknown time  . glucose blood (FREESTYLE LITE) test strip Check blood sugar one time a day as instructed 50 each 5 05/11/2016 at Unknown time  . Lancet Devices MISC 1 each by Does not apply route 2 (two) times daily. Please use to check blood sugar 2 times daily. diag code E11.40. noninsulin dependent 100 each 11 05/11/2016 at Unknown time  . omeprazole (PRILOSEC) 20 MG capsule Take 1 capsule (20 mg total) by mouth daily. (Patient not taking: Reported on 05/12/2016) 30 capsule 11 Not Taking at Unknown time  . ondansetron (ZOFRAN ODT) 4 MG disintegrating tablet Take 1 tablet (4 mg total) by mouth every 8 (eight) hours as needed for nausea or vomiting. (Patient not taking: Reported on 05/12/2016) 20 tablet 0 Not Taking at Unknown time  . oxyCODONE-acetaminophen (PERCOCET/ROXICET) 5-325 MG tablet Take 1-2 tablets by mouth every 4 (four) hours as needed for moderate pain or severe pain (surgery on 04-02-16.  Beginning on 04-06-16, limit to 6 tablets daily). (Patient not taking: Reported on 05/12/2016) 80 tablet 0 Not Taking at Unknown time    Results for orders placed or performed during the hospital encounter of 05/12/16 (from the past 48 hour(s))  CBC WITH DIFFERENTIAL     Status: Abnormal   Collection Time: 05/12/16  5:28 PM  Result Value Ref Range   WBC 8.6 4.0 - 10.5 K/uL   RBC 5.18 4.22 - 5.81 MIL/uL   Hemoglobin 11.5 (L) 13.0 - 17.0 g/dL   HCT 37.4 (L) 39.0 - 52.0 %   MCV 72.2 (L) 78.0 - 100.0 fL   MCH 22.2 (L) 26.0 - 34.0 pg    MCHC 30.7 30.0 - 36.0 g/dL   RDW 14.4 11.5 - 15.5 %   Platelets 269 150 - 400 K/uL   Neutrophils Relative % 49 %   Neutro Abs 4.2 1.7 - 7.7 K/uL   Lymphocytes Relative 44 %   Lymphs Abs 3.8 0.7 - 4.0 K/uL   Monocytes Relative 6 %   Monocytes Absolute 0.5 0.1 - 1.0 K/uL   Eosinophils Relative 1 %   Eosinophils Absolute 0.1 0.0 - 0.7 K/uL   Basophils Relative 0 %   Basophils Absolute 0.0 0.0 - 0.1 K/uL  Sedimentation rate     Status: None   Collection Time: 05/12/16  5:28 PM  Result Value Ref Range  Sed Rate 12 0 - 16 mm/hr  C-reactive protein     Status: None   Collection Time: 05/12/16  5:28 PM  Result Value Ref Range   CRP 0.5 <1.0 mg/dL  Glucose, capillary     Status: Abnormal   Collection Time: 05/12/16  6:02 PM  Result Value Ref Range   Glucose-Capillary 134 (H) 65 - 99 mg/dL  Glucose, capillary     Status: Abnormal   Collection Time: 05/12/16 10:10 PM  Result Value Ref Range   Glucose-Capillary 123 (H) 65 - 99 mg/dL    Dg Hand Complete Right  05/12/2016  CLINICAL DATA:  Acute onset of worsening right index finger pain and swelling, with drainage. Initial encounter. EXAM: RIGHT HAND - COMPLETE 3+ VIEW COMPARISON:  Right index finger radiographs performed 03/26/2016 FINDINGS: There is lucency about the distal aspect of the second middle phalanx, at the site of the patient's pin, compatible with osteomyelitis. Associated diffuse soft tissue swelling is noted about the second digit. There is widening of the second distal interphalangeal joint, which likely reflects underlying joint infection. Remaining visualized joint spaces are grossly preserved. There is chronic deformity of the distal fifth metacarpal. IMPRESSION: Lucency noted at the distal aspect of the second middle phalanx, at the site of the patient's pain, likely reflecting osteomyelitis. Associated diffuse soft tissue swelling about the second digit. Widening of the second distal interphalangeal joint likely reflects  underlying joint infection. Electronically Signed   By: Garald Balding M.D.   On: 05/12/2016 02:18    ROS  ROS: I have reviewed the patient's review of systems thoroughly and there are no positive responses as relates to the HPI. EXAM: Blood pressure 131/65, pulse 75, temperature 98.6 F (37 C), temperature source Oral, resp. rate 16, SpO2 100 %. Physical Exam Well-developed well-nourished patient in no acute distress. Alert and oriented x3 HEENT:within normal limits Cardiac: Regular rate and rhythm Pulmonary: Lungs clear to auscultation Abdomen: Soft and nontender.  Normal active bowel sounds  Musculoskeletal: right index finger: Significant soft tissue swelling. Scant drainage. Mild instability. Pain to all range of motion.  No erythema or warmth. Assessment/Plan: 55 year old male status postpen reduction and fixation of middle phalanx fracture with repair of flexor and extensor tendons. The patient is 6 weeks out frominjury with concerns over swelling and the possibility of infection. He is admitted to the hospital for IV antibiotic therapy and we will consult infectious disease for duration of therapy. Dr. Grandville Silos will evaluate him in the hospital and give consideration of possible debridement on this hospital admission.  Devontaye Ground L 05/13/2016, 12:39 AM

## 2016-05-14 ENCOUNTER — Encounter (HOSPITAL_COMMUNITY): Payer: Self-pay | Admitting: General Practice

## 2016-05-14 LAB — GLUCOSE, CAPILLARY
GLUCOSE-CAPILLARY: 96 mg/dL (ref 65–99)
Glucose-Capillary: 123 mg/dL — ABNORMAL HIGH (ref 65–99)
Glucose-Capillary: 159 mg/dL — ABNORMAL HIGH (ref 65–99)
Glucose-Capillary: 131 mg/dL — ABNORMAL HIGH (ref 65–99)

## 2016-05-14 LAB — SURGICAL PCR SCREEN
MRSA, PCR: NEGATIVE
STAPHYLOCOCCUS AUREUS: NEGATIVE

## 2016-05-14 MED ORDER — CHLORHEXIDINE GLUCONATE 4 % EX LIQD
60.0000 mL | Freq: Once | CUTANEOUS | Status: DC
  Filled 2016-05-14: qty 60

## 2016-05-14 MED ORDER — POVIDONE-IODINE 10 % EX SWAB: 2.0000 "application " | Freq: Once | CUTANEOUS | Status: DC

## 2016-05-14 NOTE — Progress Notes (Signed)
   INFECTIOUS DISEASE PROGRESS NOTE  ID: Donald Palmer is a 55 y.o. male with  Principal Problem:   Acute osteomyelitis of right hand including fingers ([place]) Active Problems:   Infection of index finger   Acute osteomyelitis of left hand including fingers ([facility])  Subjective: frustrated  Abtx:  Anti-infectives    Start     Dose/Rate Route Frequency Ordered Stop   05/13/16 1600  cefTRIAXone (ROCEPHIN) 2 g in dextrose 5 % 50 mL IVPB     2 g 100 mL/hr over 30 Minutes Intravenous Every 24 hours 05/13/16 1539     05/13/16 1600  vancomycin (VANCOCIN) IVPB 1000 mg/200 mL premix     1,000 mg 200 mL/hr over 60 Minutes Intravenous Every 8 hours 05/13/16 1539     05/13/16 1530  ceFEPIme (MAXIPIME) 2 g in dextrose 5 % 50 mL IVPB  Status:  Discontinued     2 g 100 mL/hr over 30 Minutes Intravenous Every 24 hours 05/13/16 1529 05/13/16 1537   05/12/16 1700  ceFAZolin (ANCEF) IVPB 1 g/50 mL premix  Status:  Discontinued     1 g 100 mL/hr over 30 Minutes Intravenous Every 8 hours 05/12/16 1538 05/13/16 1529      Medications:  Scheduled: . cefTRIAXone (ROCEPHIN)  IV  2 g Intravenous Q24H  . celecoxib  200 mg Oral Daily  . chlorhexidine  60 mL Topical Once  . famotidine  20 mg Oral BID  . insulin aspart  0-15 Units Subcutaneous TID WC  . lisinopril  20 mg Oral Daily  . pioglitazone  45 mg Oral Q breakfast  . povidone-iodine  2 application Topical Once  . simvastatin  40 mg Oral QPM  . vancomycin  1,000 mg Intravenous Q8H    Objective: Vital signs in last 24 hours: Temp:  [98.1 F (36.7 C)-98.9 F (37.2 C)] 98.1 F (36.7 C) (05/29 0544) Pulse Rate:  [66-72] 66 (05/29 0544) Resp:  [16] 16 (05/29 0544) BP: (137-147)/(60-75) 137/60 mmHg (05/29 0544) SpO2:  [100 %] 100 % (05/29 0544)   General appearance: alert, cooperative and no distress Incision/Wound: R 2nd digit wrapped.   Lab Results  Recent Labs  05/12/16 0153 05/12/16 1728  WBC 9.5 8.6  HGB 12.2* 11.5*  HCT 38.5*  37.4*  NA 137  --   K 3.8  --   CL 105  --   CO2 26  --   BUN 12  --   CREATININE 0.88  --    Liver Panel No results for input(s): PROT, ALBUMIN, AST, ALT, ALKPHOS, BILITOT, BILIDIR, IBILI in the last 72 hours. Sedimentation Rate  Recent Labs  05/12/16 1728  ESRSEDRATE 12   C-Reactive Protein  Recent Labs  05/12/16 1728  CRP 0.5    Microbiology: No results found for this or any previous visit (from the past 240 hour(s)).  Studies/Results: No results found.   Assessment/Plan: R second digit osteo  DM2 with neuropathy  Total days of antibiotics: 2 vanco/ceftriaxone  Glc fairly well controlled.  A1C pending HIV Ab pending Await Cx results from Dr Berenice Primas office (none in [place]) No change in atbx for now.  Await pt, family decision re: long term atbx vs amputation.  He is leaning towards amputation tomorrow.          Bobby Rumpf Infectious Diseases (pager) [phone] [email or web address] 05/14/2016, 1:52 PM  LOS: 2 days

## 2016-05-14 NOTE — Progress Notes (Signed)
Patient with right IF swelling, pain. 2-3 open wounds No active drainage at moment, scant draining in dressing.  I d/w patient 2 alternatives: 1. Continue to work for digital salvage, likely with 1+ debridements and protracted antibiotic treatment with best foreseeable outcome being a stiff DIPJ with poor radial sensibility 2. Amputation thru P2  We discussed aspects related to each of these pathways, and he will consult with family today. I will call him later to see what he has decided. Plan on OR tomorrow either way.  Micheline Rough, MD [facility] [phone]

## 2016-05-15 ENCOUNTER — Encounter (HOSPITAL_COMMUNITY)
Admission: RE | Disposition: A | Discharge status: Home / Self Care | Payer: Self-pay | Source: Ambulatory Visit | Attending: Orthopedic Surgery

## 2016-05-15 ENCOUNTER — Inpatient Hospital Stay (HOSPITAL_COMMUNITY): Payer: Commercial Managed Care - HMO | Admitting: Anesthesiology

## 2016-05-15 ENCOUNTER — Encounter (HOSPITAL_COMMUNITY): Payer: Self-pay | Admitting: Anesthesiology

## 2016-05-15 DIAGNOSIS — Z8619 Personal history of other infectious and parasitic diseases: Secondary | ICD-10-CM

## 2016-05-15 DIAGNOSIS — M86141 Other acute osteomyelitis, right hand: Secondary | ICD-10-CM

## 2016-05-15 DIAGNOSIS — R208 Other disturbances of skin sensation: Secondary | ICD-10-CM

## 2016-05-15 DIAGNOSIS — B9689 Other specified bacterial agents as the cause of diseases classified elsewhere: Secondary | ICD-10-CM

## 2016-05-15 DIAGNOSIS — Z89021 Acquired absence of right finger(s): Secondary | ICD-10-CM

## 2016-05-15 DIAGNOSIS — E119 Type 2 diabetes mellitus without complications: Secondary | ICD-10-CM

## 2016-05-15 HISTORY — PX: I & D EXTREMITY: SHX5045

## 2016-05-15 LAB — GLUCOSE, CAPILLARY
GLUCOSE-CAPILLARY: 97 mg/dL (ref 65–99)
GLUCOSE-CAPILLARY: 106 mg/dL — AB (ref 65–99)
Glucose-Capillary: 90 mg/dL (ref 65–99)
Glucose-Capillary: 93 mg/dL (ref 65–99)
Glucose-Capillary: 137 mg/dL — ABNORMAL HIGH (ref 65–99)

## 2016-05-15 LAB — BASIC METABOLIC PANEL
Anion gap: 6 (ref 5–15)
BUN: 15 mg/dL (ref 6–20)
CHLORIDE: 103 mmol/L (ref 101–111)
CO2: 27 mmol/L (ref 22–32)
CREATININE: 0.9 mg/dL (ref 0.61–1.24)
Calcium: 9 mg/dL (ref 8.9–10.3)
GFR calc non Af Amer: >60 mL/min (ref >60)
Glucose, Bld: 185 mg/dL — ABNORMAL HIGH (ref 65–99)
POTASSIUM: 4 mmol/L (ref 3.5–5.1)
SODIUM: 136 mmol/L (ref 135–145)

## 2016-05-15 LAB — HEMOGLOBIN A1C
Hgb A1c MFr Bld: 7.7 % — ABNORMAL HIGH (ref 4.8–5.6)
Mean Plasma Glucose: 174 mg/dL

## 2016-05-15 LAB — HIV ANTIBODY (ROUTINE TESTING W REFLEX): HIV SCREEN 4TH GENERATION: NONREACTIVE

## 2016-05-15 SURGERY — IRRIGATION AND DEBRIDEMENT EXTREMITY
Anesthesia: Monitor Anesthesia Care | Site: Finger | Laterality: Right

## 2016-05-15 MED ORDER — 0.9 % SODIUM CHLORIDE (POUR BTL) OPTIME
TOPICAL | Status: DC | PRN
  Administered 2016-05-15: 1000 mL

## 2016-05-15 MED ORDER — LIDOCAINE HCL (CARDIAC) 20 MG/ML IV SOLN
INTRAVENOUS | Status: DC | PRN
  Administered 2016-05-15: 40 mg via INTRAVENOUS

## 2016-05-15 MED ORDER — ONDANSETRON HCL 4 MG/2ML IJ SOLN: 4.0000 mg | Freq: Four times a day (QID) | INTRAMUSCULAR | Status: DC | PRN

## 2016-05-15 MED ORDER — ONDANSETRON HCL 4 MG/2ML IJ SOLN
INTRAMUSCULAR | Status: DC | PRN
  Administered 2016-05-15: 4 mg via INTRAVENOUS

## 2016-05-15 MED ORDER — PROPOFOL 500 MG/50ML IV EMUL
INTRAVENOUS | Status: DC | PRN
  Administered 2016-05-15: 100 ug/kg/min via INTRAVENOUS

## 2016-05-15 MED ORDER — FENTANYL CITRATE (PF) 100 MCG/2ML IJ SOLN: 25.0000 ug | INTRAMUSCULAR | Status: DC | PRN

## 2016-05-15 MED ORDER — PROPOFOL 10 MG/ML IV BOLUS
INTRAVENOUS | Status: AC
  Filled 2016-05-15: qty 20

## 2016-05-15 MED ORDER — LIDOCAINE 2% (20 MG/ML) 5 ML SYRINGE
INTRAMUSCULAR | Status: AC
  Filled 2016-05-15: qty 5

## 2016-05-15 MED ORDER — FENTANYL CITRATE (PF) 100 MCG/2ML IJ SOLN
INTRAMUSCULAR | Status: DC | PRN
Start: 2016-05-15 — End: 2016-05-15
  Administered 2016-05-15 (×2): 50 ug via INTRAVENOUS

## 2016-05-15 MED ORDER — OXYCODONE HCL 5 MG PO TABS: 5.0000 mg | ORAL_TABLET | Freq: Once | ORAL | Status: DC | PRN

## 2016-05-15 MED ORDER — MIDAZOLAM HCL 5 MG/5ML IJ SOLN
INTRAMUSCULAR | Status: DC | PRN
  Administered 2016-05-15: 2 mg via INTRAVENOUS

## 2016-05-15 MED ORDER — FENTANYL CITRATE (PF) 250 MCG/5ML IJ SOLN
INTRAMUSCULAR | Status: AC
  Filled 2016-05-15: qty 5

## 2016-05-15 MED ORDER — LIDOCAINE HCL (PF) 1 % IJ SOLN
INTRAMUSCULAR | Status: AC
  Filled 2016-05-15: qty 30

## 2016-05-15 MED ORDER — OXYCODONE HCL 5 MG/5ML PO SOLN: 5.0000 mg | Freq: Once | ORAL | Status: DC | PRN

## 2016-05-15 MED ORDER — PROPOFOL 10 MG/ML IV BOLUS
INTRAVENOUS | Status: DC | PRN
  Administered 2016-05-15: 20 mg via INTRAVENOUS

## 2016-05-15 MED ORDER — BUPIVACAINE-EPINEPHRINE 0.5% -1:200000 IJ SOLN
INTRAMUSCULAR | Status: DC | PRN
  Administered 2016-05-15: 30 mL

## 2016-05-15 MED ORDER — MIDAZOLAM HCL 2 MG/2ML IJ SOLN
INTRAMUSCULAR | Status: AC
  Filled 2016-05-15: qty 2

## 2016-05-15 MED ORDER — BUPIVACAINE-EPINEPHRINE (PF) 0.5% -1:200000 IJ SOLN
INTRAMUSCULAR | Status: AC
  Filled 2016-05-15: qty 30

## 2016-05-15 MED ORDER — LACTATED RINGERS IV SOLN
INTRAVENOUS | Status: DC
  Administered 2016-05-15 (×3): via INTRAVENOUS

## 2016-05-15 MED ORDER — LIDOCAINE HCL 1 % IJ SOLN
INTRAMUSCULAR | Status: DC | PRN
  Administered 2016-05-15: 30 mL

## 2016-05-15 SURGICAL SUPPLY — 53 items
BANDAGE COBAN STERILE 2 (GAUZE/BANDAGES/DRESSINGS) ×3 IMPLANT
BLADE AVERAGE 25MMX9MM (BLADE)
BLADE AVERAGE 25X9 (BLADE) IMPLANT
BNDG COHESIVE 4X5 TAN NS LF (GAUZE/BANDAGES/DRESSINGS) IMPLANT
BNDG CONFORM 2 STRL LF (GAUZE/BANDAGES/DRESSINGS) ×3 IMPLANT
BNDG CONFORM 3 STRL LF (GAUZE/BANDAGES/DRESSINGS) IMPLANT
BNDG ELASTIC 2X5.8 VLCR STR LF (GAUZE/BANDAGES/DRESSINGS) IMPLANT
BNDG ESMARK 4X9 LF (GAUZE/BANDAGES/DRESSINGS) ×3 IMPLANT
BNDG GAUZE ELAST 4 BULKY (GAUZE/BANDAGES/DRESSINGS) IMPLANT
CHLORAPREP W/TINT 26ML (MISCELLANEOUS) ×3 IMPLANT
CONT SPECI 4OZ STER CLIK (MISCELLANEOUS) ×6 IMPLANT
CORDS BIPOLAR (ELECTRODE) ×3 IMPLANT
COVER SURGICAL LIGHT HANDLE (MISCELLANEOUS) ×3 IMPLANT
DRAPE SURG 17X23 STRL (DRAPES) ×3 IMPLANT
DRSG EMULSION OIL 3X3 NADH (GAUZE/BANDAGES/DRESSINGS) ×3 IMPLANT
ELECT REM PT RETURN 9FT ADLT (ELECTROSURGICAL) ×3
ELECTRODE REM PT RTRN 9FT ADLT (ELECTROSURGICAL) ×1 IMPLANT
GAUZE SPONGE 4X4 12PLY STRL (GAUZE/BANDAGES/DRESSINGS) ×3 IMPLANT
GAUZE XEROFORM 1X8 LF (GAUZE/BANDAGES/DRESSINGS) IMPLANT
GLOVE BIO SURGEON STRL SZ 6.5 (GLOVE) ×2 IMPLANT
GLOVE BIO SURGEON STRL SZ7.5 (GLOVE) ×3 IMPLANT
GLOVE BIO SURGEONS STRL SZ 6.5 (GLOVE) ×1
GLOVE BIOGEL PI IND STRL 6.5 (GLOVE) ×1 IMPLANT
GLOVE BIOGEL PI IND STRL 7.0 (GLOVE) ×1 IMPLANT
GLOVE BIOGEL PI IND STRL 8 (GLOVE) ×1 IMPLANT
GLOVE BIOGEL PI INDICATOR 6.5 (GLOVE) ×2
GLOVE BIOGEL PI INDICATOR 7.0 (GLOVE) ×2
GLOVE BIOGEL PI INDICATOR 8 (GLOVE) ×2
GLOVE SURG SS PI 7.0 STRL IVOR (GLOVE) ×3 IMPLANT
GOWN STRL REUS W/ TWL LRG LVL3 (GOWN DISPOSABLE) ×1 IMPLANT
GOWN STRL REUS W/ TWL XL LVL3 (GOWN DISPOSABLE) ×3 IMPLANT
GOWN STRL REUS W/TWL LRG LVL3 (GOWN DISPOSABLE) ×2
GOWN STRL REUS W/TWL XL LVL3 (GOWN DISPOSABLE) ×6
KIT BASIN OR (CUSTOM PROCEDURE TRAY) ×3 IMPLANT
NEEDLE HYPO 25X1 1.5 SAFETY (NEEDLE) ×3 IMPLANT
PACK ORTHO EXTREMITY (CUSTOM PROCEDURE TRAY) ×3 IMPLANT
PAD CAST 4YDX4 CTTN HI CHSV (CAST SUPPLIES) IMPLANT
PADDING CAST ABS 4INX4YD NS (CAST SUPPLIES) ×2
PADDING CAST ABS COTTON 4X4 ST (CAST SUPPLIES) ×1 IMPLANT
PADDING CAST COTTON 4X4 STRL (CAST SUPPLIES)
PENCIL BUTTON HOLSTER BLD 10FT (ELECTRODE) ×3 IMPLANT
SPONGE GAUZE 4X4 12PLY STER LF (GAUZE/BANDAGES/DRESSINGS) ×3 IMPLANT
SUT ETHILON 4 0 PS 2 18 (SUTURE) IMPLANT
SUT MNCRL AB 4-0 PS2 18 (SUTURE) IMPLANT
SUT PROLENE 4 0 PS 2 18 (SUTURE) ×3 IMPLANT
SUT VICRYL RAPIDE 4/0 PS 2 (SUTURE) IMPLANT
SWAB COLLECTION DEVICE MRSA (MISCELLANEOUS) ×3 IMPLANT
SWAB CULTURE ESWAB REG 1ML (MISCELLANEOUS) ×3 IMPLANT
SYRINGE 10CC LL (SYRINGE) ×3 IMPLANT
TOWEL OR 17X24 6PK STRL BLUE (TOWEL DISPOSABLE) ×3 IMPLANT
TUBE CONNECTING 12'X1/4 (SUCTIONS) ×1
TUBE CONNECTING 12X1/4 (SUCTIONS) ×2 IMPLANT
UNDERPAD 30X30 INCONTINENT (UNDERPADS AND DIAPERS) ×3 IMPLANT

## 2016-05-15 NOTE — Progress Notes (Signed)
Patient has elected to proceed with amputation through the middle phalanx rather than continued efforts with goal for digital salvage.  We will proceed momentarily, and I will send specimen to pathology to try to determine if the middle phalanx is involved with acute osteomyelitis.  I think d/c home tomorrow is reasonable, depending upon antibiotic plan/needs that will follow from ID recommendations.  Office cultures:  Few GPC in pairs and clusters, final report was NORMAL SKIN FLORA, with multiple organisms present, none predominant.  No Staph Aureus isolated.  No Group A Strep isolated.  Micheline Rough, MD Hand Surgery

## 2016-05-15 NOTE — Discharge Instructions (Addendum)
Discharge Instructions   Move your fingers as much as possible, making a full fist and fully opening the fist. Elevate your hand to reduce pain & swelling of the digits.  Ice over the operative site may be helpful to reduce pain & swelling.  DO NOT USE HEAT. Pain medicine has been prescribed for you.  Use your medicine as needed over the first 48 hours, and then you can begin to taper your use.  You may use Tylenol in place of your prescribed pain medication, but not IN ADDITION to it. Leave the dressing in place until you return to our office.  You may shower, but keep the bandage clean & dry.  You may drive a car when you are off of prescription pain medications and can safely control your vehicle with both hands. Our office will call you to arrange follow-up   Please call [phone] during normal business hours or [phone] after hours for any problems. Including the following:  - excessive redness of the incisions - drainage for more than 4 days - fever of more than 101.5 F  *Please note that pain medications will not be refilled after hours or on weekends.

## 2016-05-15 NOTE — Anesthesia Preprocedure Evaluation (Addendum)
Anesthesia Evaluation  Patient identified by MRN, date of birth, ID band Patient awake    Reviewed: Allergy & Precautions, NPO status , Patient's Chart, lab work & pertinent test results  Airway Mallampati: II  TM Distance: >3 FB Neck ROM: full    Dental  (+) Poor Dentition, Missing, Dental Advisory Given,    Pulmonary Current Smoker,    breath sounds clear to auscultation       Cardiovascular hypertension, + Peripheral Vascular Disease   Rhythm:regular Rate:Normal     Neuro/Psych  Neuromuscular disease    GI/Hepatic GERD  ,(+) Hepatitis -, C  Endo/Other  diabetes, Type 2  Renal/GU      Musculoskeletal   Abdominal   Peds  Hematology   Anesthesia Other Findings   Reproductive/Obstetrics                            Anesthesia Physical Anesthesia Plan  ASA: II  Anesthesia Plan: MAC   Post-op Pain Management:    Induction: Intravenous  Airway Management Planned: Simple Face Mask  Additional Equipment:   Intra-op Plan:   Post-operative Plan:   Informed Consent: I have reviewed the patients History and Physical, chart, labs and discussed the procedure including the risks, benefits and alternatives for the proposed anesthesia with the patient or authorized representative who has indicated his/her understanding and acceptance.     Plan Discussed with: CRNA, Anesthesiologist and Surgeon  Anesthesia Plan Comments:         Anesthesia Quick Evaluation

## 2016-05-15 NOTE — Progress Notes (Addendum)
    [facility] for Infectious Disease    Date of Admission:  05/12/2016   Total days of antibiotics 4        Day 3 ceftriaxone        Day 3 vanco           ID: Donald Palmer is a 55 y.o. male with acute osteo of right index finger POD#0  s/p amputation Principal Problem:   Acute osteomyelitis of right hand including fingers ([place]) Active Problems:   Infection of index finger   Acute osteomyelitis of left hand including fingers ([facility])    Subjective: Afebrile. Still some numbness since surgery  Medications:  . cefTRIAXone (ROCEPHIN)  IV  2 g Intravenous Q24H  . celecoxib  200 mg Oral Daily  . famotidine  20 mg Oral BID  . insulin aspart  0-15 Units Subcutaneous TID WC  . lisinopril  20 mg Oral Daily  . pioglitazone  45 mg Oral Q breakfast  . simvastatin  40 mg Oral QPM  . vancomycin  1,000 mg Intravenous Q8H    Objective: Vital signs in last 24 hours: Temp:  [97.2 F (36.2 C)-97.7 F (36.5 C)] 97.6 F (36.4 C) (05/30 1507) Pulse Rate:  [57-75] 58 (05/30 1537) Resp:  [8-18] 16 (05/30 1537) BP: (112-133)/(65-80) 133/74 mmHg (05/30 1537) SpO2:  [98 %-100 %] 100 % (05/30 1537) Weight:  [177 lb (80.287 kg)] 177 lb (80.287 kg) (05/30 1100) Physical Exam  Constitutional: He is oriented to person, place, and time. He appears well-developed and well-nourished. No distress.  HENT:  Mouth/Throat: Oropharynx is clear and moist. No oropharyngeal exudate.  Cardiovascular: Normal rate, regular rhythm and normal heart sounds. Exam reveals no gallop and no friction rub.  No murmur heard.  Pulmonary/Chest: Effort normal and breath sounds normal. No respiratory distress. He has no wheezes.  Abdominal: Soft. Bowel sounds are normal. He exhibits no distension. There is no tenderness.  Lymphadenopathy:  He has no cervical adenopathy.  Ext: right hand 2nd digit wrapped, shorter than middle digit Neurological: He is alert and oriented to person, place, and time.  Skin: Skin is warm and  dry. No rash noted. No erythema.  Psychiatric: He has a normal mood and affect. His behavior is normal.    Lab Results  Recent Labs  05/15/16 0332  NA 136  K 4.0  CL 103  CO2 27  BUN 15  CREATININE 0.90   Lab Results  Component Value Date   ESRSEDRATE 12 05/12/2016   Lab Results  Component Value Date   CRP 0.5 05/12/2016    Microbiology: 5/30 tissue cx  Assessment/Plan: Acute osteo of right index finger s/p amputation = can do 7 days of oral abtx when ready for discharge as part of mop-up. Will recommend bactrim DS 1 tab BID and cephalexin 500mg  PO QID. Will follow up culture to see if need to change regimen  Diabetes mellitus = well controlled, does not appear to be affected by recent infection  Hx of hep C infection = now cured. Still needs Q 6-12 month [facility] surveillance imaging as outpatient  Hand numbness = likely due to anesthesia wearing off from surgery. Anticipate to normalize  Baxter Flattery [facility] for Infectious Diseases Cell: [phone] Pager: [phone]  05/15/2016, 6:48 PM

## 2016-05-15 NOTE — Anesthesia Postprocedure Evaluation (Signed)
Anesthesia Post Note  Patient: Donald Palmer  Procedure(s) Performed: Procedure(s) (LRB): RIGHT INDEX FINGER FIRST THROUGH  MIDDLE PHALYNX  AMPUTATION (Right)  Patient location during evaluation: PACU Anesthesia Type: MAC Level of consciousness: awake and alert Pain management: pain level controlled Vital Signs Assessment: post-procedure vital signs reviewed and stable Respiratory status: spontaneous breathing, nonlabored ventilation, respiratory function stable and patient connected to nasal cannula oxygen Cardiovascular status: stable and blood pressure returned to baseline Anesthetic complications: no    Last Vitals:  Filed Vitals:   05/15/16 1522 05/15/16 1537  BP: 119/76 133/74  Pulse: 57 58  Temp:    Resp: 8 16    Last Pain:  Filed Vitals:   05/15/16 1538  PainSc: [place] Samrat [place]

## 2016-05-15 NOTE — Op Note (Signed)
05/12/2016 - 05/15/2016  2:06 PM  PATIENT:  Donald Palmer  55 y.o. male  PRE-OPERATIVE DIAGNOSIS:  Right index finger tip deep infection following incomplete amputation, possibly with osteomyelitis  POST-OPERATIVE DIAGNOSIS:  Same  PROCEDURE:  Right index finger amputation through the middle phalanx  SURGEON: Rayvon Char. Grandville Silos, MD  PHYSICIAN ASSISTANT: Morley Kos, OPA-C  ANESTHESIA:  local and MAC  SPECIMENS:   1.  Right index fingertip to pathology    2.  Right index finger amputated portion of middle phalanx to pathology for separate evaluation for acute osteomyelitis    3.  Right index finger soft tissues for microbiology analysis  DRAINS:   None  EBL:  less than 50 mL  PREOPERATIVE INDICATIONS:  Donald Palmer is a  55 y.o. male with history of near complete amputation of the right index fingertip.  He has had subsequent failure fixation and likely deep infection with a couple different draining sinuses.  Suspicion exists for possible osteomyelitis of the distal aspect of the middle phalanx.  After substantial conversation with him, detailing the pros and cons of continued digital salvage attempts versus amputation, he wished to proceed with amputation and understands that it will be thru the middle phalanx.  The risks benefits and alternatives were discussed with the patient preoperatively including but not limited to the risks of infection, bleeding, nerve injury, cardiopulmonary complications, the need for revision surgery, among others, and the patient verbalized understanding and consented to proceed.  OPERATIVE IMPLANTS: None  OPERATIVE PROCEDURE:  The patient was on scheduled antibiotics.  He was escorted to the operative theatre and placed in a supine position.  A surgical "time-out" was performed during which the planned procedure, proposed operative site, and the correct patient identity were compared to the operative consent and agreement confirmed by the circulating  nurse according to current facility policy.  Digital block was instilled by me with a mixture of lidocaine and Marcaine bearing epinephrine.  Following application of a tourniquet to the operative extremity, the exposed skin was prepped with Chloraprep and draped in the usual sterile fashion.  The limb was exsanguinated with an Esmarch bandage and the tourniquet inflated to approximately 138mmHg higher than systolic BP.  An incision was marked on the right index finger at the margin of the uninvolved tissue.  This allowed for a slightly longer ulnar soft tissue flap be created.  The skin was incised sharply with scalpel, and ultimately the fingertip was amputated through the DIP joint.  There was some snotty tissue consistent with chronic infection at the fracture site and within the soft tissues adjacent to the flexor tendon.  The FDP was transected quite far proximally after pulling it into the wound.  Subperiosteal dissection was carried along the middle phalanx, where bone cutter was used to cut into the diaphysis proximal to the condylar flare.  The specimen was passed off for independent separate analysis frosty myelitis.  Some additional portions of the middle phalanx was debrided with a rongeur as the bone was still a little soft.  The wound is copiously irrigated.  Rongeur was used to further debride any suspect soft tissues.  Tourniquet was released, additional hemostasis obtained, and the amputation closed loosely with 4-0 Prolene suture, making use of a slightly longer ulnar flap closure.  A light dressing was applied he was taken to room stable condition  DISPOSITION: He'll return to the floor for continued medical management of his infection and be discharged home once thought appropriate for  such, possibly tomorrow, largely dependent upon his antibiotic plan.

## 2016-05-15 NOTE — Transfer of Care (Signed)
Immediate Anesthesia Transfer of Care Note  Patient: GERRIT SCHREINER  Procedure(s) Performed: Procedure(s): RIGHT INDEX FINGER FIRST THROUGH  MIDDLE PHALYNX  AMPUTATION (Right)  Patient Location: PACU  Anesthesia Type:MAC  Level of Consciousness: oriented, sedated and patient cooperative  Airway & Oxygen Therapy: Patient Spontanous Breathing and Patient connected to nasal cannula oxygen  Post-op Assessment: Report given to RN and Post -op Vital signs reviewed and stable  Post vital signs: Reviewed  Last Vitals:  Filed Vitals:   05/15/16 0548 05/15/16 1254  BP: 129/77 129/80  Pulse: 72 75  Temp: 36.5 C 36.2 C  Resp: 18 18    Last Pain:  Filed Vitals:   05/15/16 1507  PainSc: 9       Patients Stated Pain Goal: 3 ([id number])  Complications: No apparent anesthesia complications

## 2016-05-15 NOTE — Anesthesia Procedure Notes (Signed)
Procedure Name: MAC Date/Time: 05/15/2016 2:26 PM Performed by: Jenne Campus Pre-anesthesia Checklist: Patient identified, Emergency Drugs available, Suction available, Patient being monitored and Timeout performed Patient Re-evaluated:Patient Re-evaluated prior to inductionOxygen Delivery Method: Simple face mask

## 2016-05-15 NOTE — Care Management Important Message (Signed)
Important Message  Patient Details  Name: Donald Palmer MRN: [id number] Date of Birth: [birth year]   Medicare Important Message Given:  Yes    Loann Quill 05/15/2016, 8:01 AM

## 2016-05-16 LAB — GLUCOSE, CAPILLARY: GLUCOSE-CAPILLARY: 167 mg/dL — AB (ref 65–99)

## 2016-05-16 MED ORDER — OXYCODONE-ACETAMINOPHEN 5-325 MG PO TABS: 1.0000 | ORAL_TABLET | ORAL | Status: DC | PRN

## 2016-05-16 MED ORDER — CEPHALEXIN 500 MG PO CAPS: 500.0000 mg | ORAL_CAPSULE | Freq: Four times a day (QID) | ORAL | Status: DC

## 2016-05-16 MED ORDER — SULFAMETHOXAZOLE-TRIMETHOPRIM 800-160 MG PO TABS: 1.0000 | ORAL_TABLET | Freq: Two times a day (BID) | ORAL | Status: DC

## 2016-05-16 NOTE — Progress Notes (Signed)
Donald Palmer to be D/C'd Home per MD order.  Discussed with the patient and all questions fully answered.  VSS, Skin clean, dry and intact without evidence of skin break down, no evidence of skin tears noted. IV catheter discontinued intact. Site without signs and symptoms of complications. Dressing and pressure applied.  An After Visit Summary was printed and given to the patient. Patient received prescription.  D/c education completed with patient and daughter including follow up instructions, medication list, d/c activities limitations if indicated, with other d/c instructions as indicated by MD - patient able to verbalize understanding, all questions fully answered.   Patient instructed to return to ED, call 911, or call MD for any changes in condition.   Patient will be escorted via WC, and D/C home via private auto.  [name] 05/16/2016 11:05 AM

## 2016-05-16 NOTE — Discharge Summary (Addendum)
Physician Discharge Summary  [name] MRN: [id number] DOB/AGE: 1961/10/24 55 y.o.  Admit date: 05/12/2016 Discharge date: 05/16/2016  Admission Diagnoses:  Acute osteomyelitis of right hand including fingers [facility])  Discharge Diagnoses:  Principal Problem:   Acute osteomyelitis of right hand including fingers ([place]) Active Problems:   Infection of index finger   Acute osteomyelitis of left hand including fingers [facility])   Past Medical History  Diagnosis Date  . Diabetes mellitus 2007  . Neuromuscular disorder ([place])   . Pancreatitis 01/09/2012  . Hypertension goal BP (blood pressure) < 140/80   . Hepatitis C     [id number]    Surgeries: Procedure(s): RIGHT INDEX FINGER FIRST THROUGH  MIDDLE PHALYNX  AMPUTATION on 05/12/2016 - 05/15/2016   Consultants (if any):    Discharged Condition: Improved  Hospital Course: Donald Palmer is an 55 y.o. male who was admitted 05/12/2016 with a diagnosis of Acute osteomyelitis of right hand including fingers ([place]) and went to the operating room on 05/12/2016 - 05/15/2016 and underwent the above named procedures.    He was given perioperative antibiotics and will be d/c on a week of PO antibiotics at the recommendation of Dr. Baxter Flattery of Infectious Diseases Anti-infectives    Start     Dose/Rate Route Frequency Ordered Stop   05/16/16 0000  sulfamethoxazole-trimethoprim (BACTRIM DS,SEPTRA DS) 800-160 MG tablet     1 tablet Oral 2 times daily 05/16/16 0837     05/16/16 0000  cephALEXin (KEFLEX) 500 MG capsule     500 mg Oral 4 times daily 05/16/16 0837     05/13/16 1600  cefTRIAXone (ROCEPHIN) 2 g in dextrose 5 % 50 mL IVPB     2 g 100 mL/hr over 30 Minutes Intravenous Every 24 hours 05/13/16 1539     05/13/16 1600  vancomycin (VANCOCIN) IVPB 1000 mg/200 mL premix     1,000 mg 200 mL/hr over 60 Minutes Intravenous Every 8 hours 05/13/16 1539     05/13/16 1530  ceFEPIme (MAXIPIME) 2 g in dextrose 5 % 50 mL IVPB  Status:  Discontinued      2 g 100 mL/hr over 30 Minutes Intravenous Every 24 hours 05/13/16 1529 05/13/16 1537   05/12/16 1700  ceFAZolin (ANCEF) IVPB 1 g/50 mL premix  Status:  Discontinued     1 g 100 mL/hr over 30 Minutes Intravenous Every 8 hours 05/12/16 1538 05/13/16 1529    .  He was given sequential compression devices, early ambulation, for DVT prophylaxis.  He benefited maximally from the hospital stay and there were no complications.    Recent vital signs:  Filed Vitals:   05/16/16 0048 05/16/16 0515  BP: 134/67 129/72  Pulse: 72 78  Temp: 98.4 F (36.9 C) 98.4 F (36.9 C)  Resp: 18 18    Recent laboratory studies:  Lab Results  Component Value Date   HGB 11.5* 05/12/2016   HGB 12.2* 05/12/2016   HGB 14.4 04/28/2016   Lab Results  Component Value Date   WBC 8.6 05/12/2016   PLT 269 05/12/2016   Lab Results  Component Value Date   INR 1.0 02/08/2015   Lab Results  Component Value Date   NA 136 05/15/2016   K 4.0 05/15/2016   CL 103 05/15/2016   CO2 27 05/15/2016   BUN 15 05/15/2016   CREATININE 0.90 05/15/2016   GLUCOSE 185* 05/15/2016    Discharge Medications:     Medication List    TAKE these medications  ACCU-CHEK AVIVA PLUS w/Device Kit  1 each by Does not apply route 2 (two) times daily. To check blood sugar twice daily. diag code E11.40. Non insulin dependent. Please check with pt that this is the equipment he needs.     baclofen 10 MG tablet  Commonly known as:  LIORESAL  Take 1 tablet (10 mg total) by mouth every 8 (eight) hours as needed for muscle spasms.     celecoxib 200 MG capsule  Commonly known as:  CELEBREX  Take 1 capsule (200 mg total) by mouth daily.     cephALEXin 500 MG capsule  Commonly known as:  KEFLEX  Take 1 capsule (500 mg total) by mouth 4 (four) times daily.     clotrimazole-betamethasone cream  Commonly known as:  LOTRISONE  Apply 1 application topically 2 (two) times daily.     fluticasone 50 MCG/ACT nasal spray   Commonly known as:  FLONASE  Place 2 sprays into both nostrils daily.     gabapentin 300 MG capsule  Commonly known as:  NEURONTIN  Take 1 capsule (300 mg total) by mouth 3 (three) times daily.     glucose blood test strip  Commonly known as:  FREESTYLE LITE  Check blood sugar one time a day as instructed     Lancet Devices Misc  1 each by Does not apply route 2 (two) times daily. Please use to check blood sugar 2 times daily. diag code E11.40. noninsulin dependent     lisinopril 20 MG tablet  Commonly known as:  PRINIVIL,ZESTRIL  Take 1 tablet (20 mg total) by mouth daily.     menthol-zinc oxide powder  Apply 1 application topically daily as needed (for pain).     omeprazole 20 MG capsule  Commonly known as:  PRILOSEC  Take 1 capsule (20 mg total) by mouth daily.     ondansetron 4 MG disintegrating tablet  Commonly known as:  ZOFRAN ODT  Take 1 tablet (4 mg total) by mouth every 8 (eight) hours as needed for nausea or vomiting.     oxyCODONE-acetaminophen 5-325 MG tablet  Commonly known as:  PERCOCET/ROXICET  Take 1-2 tablets by mouth every 4 (four) hours as needed for moderate pain or severe pain (surgery on 04-02-16.  Beginning on 04-06-16, limit to 6 tablets daily).     oxyCODONE-acetaminophen 5-325 MG tablet  Commonly known as:  PERCOCET/ROXICET  Take 1-2 tablets by mouth every 4 (four) hours as needed for moderate pain or severe pain (limit to 6 tablets daily).     pioglitazone 45 MG tablet  Commonly known as:  ACTOS  Take 1 tablet (45 mg total) by mouth daily.     ranitidine 150 MG tablet  Commonly known as:  ZANTAC  Take 1 tablet (150 mg total) by mouth 2 (two) times daily.     simvastatin 40 MG tablet  Commonly known as:  ZOCOR  Take 1 tablet (40 mg total) by mouth every evening.     sulfamethoxazole-trimethoprim 800-160 MG tablet  Commonly known as:  BACTRIM DS,SEPTRA DS  Take 1 tablet by mouth 2 (two) times daily.        Diagnostic Studies: Dg Hand  Complete Right  05/12/2016  CLINICAL DATA:  Acute onset of worsening right index finger pain and swelling, with drainage. Initial encounter. EXAM: RIGHT HAND - COMPLETE 3+ VIEW COMPARISON:  Right index finger radiographs performed 03/26/2016 FINDINGS: There is lucency about the distal aspect of the second middle phalanx, at the  site of the patient's pin, compatible with osteomyelitis. Associated diffuse soft tissue swelling is noted about the second digit. There is widening of the second distal interphalangeal joint, which likely reflects underlying joint infection. Remaining visualized joint spaces are grossly preserved. There is chronic deformity of the distal fifth metacarpal. IMPRESSION: Lucency noted at the distal aspect of the second middle phalanx, at the site of the patient's pain, likely reflecting osteomyelitis. Associated diffuse soft tissue swelling about the second digit. Widening of the second distal interphalangeal joint likely reflects underlying joint infection. Electronically Signed   By: Garald Balding M.D.   On: 05/12/2016 02:18    Disposition: 01-Home or Self Care        Follow-up Information    Schedule an appointment as soon as possible for a visit with Jolyn Nap., MD.   Specialty:  Orthopedic Surgery   Why:  for next week in the office   Contact information:   [place] [address] [phone]        Signed: Grandville Silos, Omid Deardorff A. 05/16/2016, 8:37 AM

## 2016-05-17 ENCOUNTER — Ambulatory Visit: Payer: Commercial Managed Care - HMO | Admitting: Dietician

## 2016-05-17 ENCOUNTER — Encounter (HOSPITAL_COMMUNITY): Payer: Self-pay | Admitting: Orthopedic Surgery

## 2016-05-20 LAB — AEROBIC/ANAEROBIC CULTURE W GRAM STAIN (SURGICAL/DEEP WOUND)

## 2016-05-20 LAB — AEROBIC/ANAEROBIC CULTURE (SURGICAL/DEEP WOUND)

## 2016-05-22 ENCOUNTER — Ambulatory Visit (INDEPENDENT_AMBULATORY_CARE_PROVIDER_SITE_OTHER): Payer: Commercial Managed Care - HMO | Admitting: Dietician

## 2016-05-22 VITALS — Wt 178.2 lb

## 2016-05-22 DIAGNOSIS — Z713 Dietary counseling and surveillance: Secondary | ICD-10-CM

## 2016-05-22 DIAGNOSIS — Z7984 Long term (current) use of oral hypoglycemic drugs: Secondary | ICD-10-CM | POA: Diagnosis not present

## 2016-05-22 DIAGNOSIS — E119 Type 2 diabetes mellitus without complications: Secondary | ICD-10-CM | POA: Diagnosis not present

## 2016-05-22 NOTE — Progress Notes (Signed)
  Medical Nutrition Therapy:  Appt start time: 1020 end time:  1056. Visit # 3  last seen in 01/2016  Assessment:  Primary concerns today: glycemic control.  Patient presents today with his meter. No weekly profile. He reports the change in her blood sugars is due to changing his food intake to smaller portions of starchy foods, no pork and is eating more veggies, chicken and fish  Learning Readiness: Contemplating/ready/change in progress  PHYSICAL ACTIVITY:- ADLs and walks, wants a car so he does not have to walk so much ANTHROPOMETRICS: weight-178.2# , BMI-26.7 SLEEP:not addressed today MEDICATIONS: actos 45 mg /day and he reports adherence to this except when blood sugars are in the 80s and 90s. He prefers them to be ~130.   BLOOD SUGAR: meter shows 5 blood sugars in past month, 20 in past 3 months, average is ~ 135-140 DIETARY INTAKE: smaller portions of starches, drinking gatorade and 2% milk, less sweets and sugary drinks, no pork, [place] sausage and bacon, more chicken, fish,  vegetables and fruits daily   Progress Towards Goal(s):  In progress.   Nutritional Diagnosis:  NB-1.1 Food and nutrition-related knowledge deficit As related to lack of sufficient prior diabetes training is improving, As evidenced by his improved ability to identify foods that he should be eating rather than foods that increase blood sugar- his blood sugar is also improved.     Intervention:  Nutrition education about self monitoring and support to help him continue  his new lifestyle behaviors that are helping to decrease his health risk.  Coordination of care: monitor adherence to actos.   Teaching Method Utilized: Visual, Auditory, Hands on Handouts given during visit include:AVS, meter download.  Barriers to learning/adherence to lifestyle change: willingness to change diet and support Demonstrated degree of understanding via:  Teach Back   Monitoring/Evaluation:  Dietary intake, exercise, meter, and  body weight in 12 week(s). In office, 4 weeks by phone

## 2016-05-22 NOTE — Patient Instructions (Signed)
You are doing wonderful at controlling your blood sugars!!!~  Keep eating chicken and fish and vegetables.  Spices are great- add them often- cinnamon, red pepper, garlic, onions, turmeric, lemon pepper  Eat 2 servings of fruit each day and 3 servings of veggies.  I will call you in July.

## 2016-05-23 ENCOUNTER — Encounter: Payer: Self-pay | Admitting: Internal Medicine

## 2016-05-23 ENCOUNTER — Ambulatory Visit (INDEPENDENT_AMBULATORY_CARE_PROVIDER_SITE_OTHER): Payer: Commercial Managed Care - HMO | Admitting: Internal Medicine

## 2016-05-23 VITALS — BP 119/77 | HR 89 | Temp 97.8°F | Ht 69.0 in | Wt 175.0 lb

## 2016-05-23 DIAGNOSIS — B182 Chronic viral hepatitis C: Secondary | ICD-10-CM

## 2016-05-23 DIAGNOSIS — L089 Local infection of the skin and subcutaneous tissue, unspecified: Secondary | ICD-10-CM | POA: Diagnosis not present

## 2016-05-23 DIAGNOSIS — K2970 Gastritis, unspecified, without bleeding: Secondary | ICD-10-CM | POA: Diagnosis not present

## 2016-05-23 DIAGNOSIS — M869 Osteomyelitis, unspecified: Secondary | ICD-10-CM

## 2016-05-23 DIAGNOSIS — K2990 Gastroduodenitis, unspecified, without bleeding: Secondary | ICD-10-CM

## 2016-05-23 NOTE — Progress Notes (Signed)
RFV: chronic hep c and right finger amputation Subjective:    [name], male    DOB: [birth year], 55 y.o.   MRN: [id number]  HPI 55 yo M with DM2 complicated by neuropathy, chronic Hep C without hepatic coma, treated by Dr Linus Salmons who sustained a right index finger fracture, laceration of tendon while repairing a lawn mower. He underwent ORIF on 4-17. He had persistent pain and swelling to his index finger. He was found to have osteomyelitis and subsequently underwent right index finger partial amputation. He was discharged on 7 day- mop up course of bactrim and cephalexin which he finished. hisOR culture eventually grew a few staph ludgunensis ( oxa R). He states that his finger is slowly healing. He undergoes dressing changes still at hand surgeon's office. He denies at purulent drainage. No erythema to his finger, still has some neuropathic pain with dressing changes.  In regards to his hepatitis c. He has finished treatment SVR 12 had negative viral load. Last liver imaging was in April 2016. He states that he occasionally gets sx c/w gastritis and was to get EGD.  Allergies  Allergen Reactions  . Metformin And Related Other (See Comments)    GI side effects. Stopped 2016   Current Outpatient Prescriptions on File Prior to Visit  Medication Sig Dispense Refill  . baclofen (LIORESAL) 10 MG tablet Take 1 tablet (10 mg total) by mouth every 8 (eight) hours as needed for muscle spasms. 60 each 3  . Blood Glucose Monitoring Suppl (ACCU-CHEK AVIVA PLUS) W/DEVICE KIT 1 each by Does not apply route 2 (two) times daily. To check blood sugar twice daily. diag code E11.40. Non insulin dependent. Please check with pt that this is the equipment he needs. 1 kit 0  . celecoxib (CELEBREX) 200 MG capsule Take 1 capsule (200 mg total) by mouth daily. 30 capsule 5  . cephALEXin (KEFLEX) 500 MG capsule Take 1 capsule (500 mg total) by mouth 4 (four) times daily. 28 capsule 0  .  clotrimazole-betamethasone (LOTRISONE) cream Apply 1 application topically 2 (two) times daily. 45 g 2  . fluticasone (FLONASE) 50 MCG/ACT nasal spray Place 2 sprays into both nostrils daily. (Patient taking differently: Place 2 sprays into both nostrils daily as needed for allergies. ) 16 g 11  . gabapentin (NEURONTIN) 300 MG capsule Take 1 capsule (300 mg total) by mouth 3 (three) times daily. 90 capsule 11  . glucose blood (FREESTYLE LITE) test strip Check blood sugar one time a day as instructed 50 each 5  . Lancet Devices MISC 1 each by Does not apply route 2 (two) times daily. Please use to check blood sugar 2 times daily. diag code E11.40. noninsulin dependent 100 each 11  . lisinopril (PRINIVIL,ZESTRIL) 20 MG tablet Take 1 tablet (20 mg total) by mouth daily. 30 tablet 11  . menthol-zinc oxide (GOLD BOND) powder Apply 1 application topically daily as needed (for pain).    Marland Kitchen omeprazole (PRILOSEC) 20 MG capsule Take 1 capsule (20 mg total) by mouth daily. 30 capsule 11  . ondansetron (ZOFRAN ODT) 4 MG disintegrating tablet Take 1 tablet (4 mg total) by mouth every 8 (eight) hours as needed for nausea or vomiting. 20 tablet 0  . oxyCODONE-acetaminophen (PERCOCET/ROXICET) 5-325 MG tablet Take 1-2 tablets by mouth every 4 (four) hours as needed for moderate pain or severe pain (surgery on 04-02-16.  Beginning on 04-06-16, limit to 6 tablets daily). 80 tablet 0  . pioglitazone (ACTOS) 45  MG tablet Take 1 tablet (45 mg total) by mouth daily. 30 tablet 11  . ranitidine (ZANTAC) 150 MG tablet Take 1 tablet (150 mg total) by mouth 2 (two) times daily. 60 tablet 0  . simvastatin (ZOCOR) 40 MG tablet Take 1 tablet (40 mg total) by mouth every evening. 30 tablet 11  . sulfamethoxazole-trimethoprim (BACTRIM DS,SEPTRA DS) 800-160 MG tablet Take 1 tablet by mouth 2 (two) times daily. 14 tablet 0   No current facility-administered medications on file prior to visit.   Active Ambulatory Problems    Diagnosis  Date Noted  . Type 2 diabetes mellitus with diabetic neuropathy ([place]) 12/17/2001  . Hypertension 01/30/2012  . Diabetic polyradiculopathy associated with type 2 diabetes mellitus ([place]) 04/30/2012  . Chronic hepatitis C ([place]) 05/13/2012  . GERD (gastroesophageal reflux disease) 07/08/2012  . Preventative health care 12/08/2012  . Allergic rhinitis 10/22/2014  . Hyperlipidemia associated with type 2 diabetes mellitus ([place]) 12/22/2014  . Opioid dependence ([place]) 03/18/2015  . Pre-ulcerative calluses 03/30/2015  . Atherosclerosis of aorta ([place]) 03/30/2015  . Diabetic radiculopathy ([place]) 09/08/2015  . Lumbar facet arthropathy 01/16/2016  . Erectile dysfunction associated with type 2 diabetes mellitus ([place]) 03/01/2016  . Infection of index finger 05/12/2016  . Acute osteomyelitis of right hand including fingers ([place]) 05/12/2016  . Acute osteomyelitis of left hand including fingers ([place]) 05/12/2016  . Diabetes mellitus type 2 in nonobese ([facility])   . Hand numbness    Resolved Ambulatory Problems    Diagnosis Date Noted  . Pancreatitis 01/09/2012  . Abdominal pain 01/30/2012  . Screening 01/30/2012  . Diabetic neuropathy ([employer]) 01/30/2012  . Headache(784.0) 01/30/2012  . Radiculopathy 05/13/2012  . Microcytosis 05/26/2012  . Leg pain 07/08/2012  . Lumbar radiculitis 09/16/2012  . Headache(784.0) 09/23/2012  . Hematospermia 06/22/2013  . Contact dermatitis 09/29/2013  . Nonproductive cough 11/17/2013  . Diabetic neuropathy ([place]) 10/22/2014  . Screening for STDs (sexually transmitted diseases) 02/08/2015  . Facet syndrome, lumbar 02/08/2015  . Black tarry stools 03/03/2015   Past Medical History  Diagnosis Date  . Diabetes mellitus 2007  . Neuromuscular disorder ([place])   . Hypertension goal BP (blood pressure) < 140/80   . Hepatitis C       Review of Systems  Constitutional: Negative for fever, chills, diaphoresis, activity change, appetite change, fatigue and unexpected weight  change.  HENT: Negative for congestion, sore throat, rhinorrhea, sneezing, trouble swallowing and sinus pressure.  Eyes: Negative for photophobia and visual disturbance.  Respiratory: Negative for cough, chest tightness, shortness of breath, wheezing and stridor.  Cardiovascular: Negative for chest pain, palpitations and leg swelling.  Gastrointestinal: Negative for nausea, vomiting, abdominal pain, diarrhea, constipation, blood in stool, abdominal distention and anal bleeding.  Genitourinary: Negative for dysuria, hematuria, flank pain and difficulty urinating.  Musculoskeletal: finger pain. Negative for myalgias, back pain, joint swelling, arthralgias and gait problem.  Skin: Negative for color change, pallor, rash and wound.  Neurological: Negative for dizziness, tremors, weakness and light-headedness.  Hematological: Negative for adenopathy. Does not bruise/bleed easily.  Psychiatric/Behavioral: Negative for behavioral problems, confusion, sleep disturbance, dysphoric mood, decreased concentration and agitation.       Objective:   Physical Exam BP 119/77 mmHg  Pulse 89  Temp(Src) 97.8 F (36.6 C) (Oral)  Ht 5' 9"  (1.753 m)  Wt 175 lb (79.379 kg)  BMI 25.83 kg/m2 gen = a x o by 3 in NAD Cors = nl s1,s2, no g/m/r pulm = CTAB no w/c/r Ext = right  index finger is wrapped  Lab Results  Component Value Date   ESRSEDRATE 12 05/12/2016   Lab Results  Component Value Date   CRP 0.5 05/12/2016       Assessment & Plan:  Osteomyelitis secondary to trauma = underwent amputation. OR cultures grew few S.ludgenesis. He had good margins plus 7 day course of oral abtx. Will check sed rate and crp today. If elevated, will consider extending oral abtx.  Chronic hepatitis c without hepatic coma = treated. Will check RUQ U/S  Gastritis = will have him get EGD, also to look at esophageal varices

## 2016-05-28 ENCOUNTER — Ambulatory Visit (HOSPITAL_COMMUNITY)
Admission: RE | Admit: 2016-05-28 | Discharge: 2016-05-28 | Disposition: A | Discharge status: Home / Self Care | Payer: Commercial Managed Care - HMO | Source: Ambulatory Visit | Attending: Internal Medicine | Admitting: Internal Medicine

## 2016-05-28 DIAGNOSIS — B182 Chronic viral hepatitis C: Secondary | ICD-10-CM | POA: Diagnosis not present

## 2016-05-28 DIAGNOSIS — K8020 Calculus of gallbladder without cholecystitis without obstruction: Secondary | ICD-10-CM | POA: Diagnosis not present

## 2016-05-30 DIAGNOSIS — L089 Local infection of the skin and subcutaneous tissue, unspecified: Secondary | ICD-10-CM | POA: Diagnosis not present

## 2016-06-06 ENCOUNTER — Encounter: Payer: Self-pay | Admitting: Internal Medicine

## 2016-06-07 ENCOUNTER — Ambulatory Visit (INDEPENDENT_AMBULATORY_CARE_PROVIDER_SITE_OTHER): Payer: Commercial Managed Care - HMO | Admitting: Internal Medicine

## 2016-06-07 ENCOUNTER — Encounter: Payer: Self-pay | Admitting: Internal Medicine

## 2016-06-07 VITALS — BP 130/72 | HR 89 | Temp 98.0°F | Ht 69.0 in | Wt 182.7 lb

## 2016-06-07 DIAGNOSIS — E1140 Type 2 diabetes mellitus with diabetic neuropathy, unspecified: Secondary | ICD-10-CM

## 2016-06-07 DIAGNOSIS — J309 Allergic rhinitis, unspecified: Secondary | ICD-10-CM

## 2016-06-07 DIAGNOSIS — E784 Other hyperlipidemia: Secondary | ICD-10-CM

## 2016-06-07 DIAGNOSIS — Z0000 Encounter for general adult medical examination without abnormal findings: Secondary | ICD-10-CM

## 2016-06-07 DIAGNOSIS — G894 Chronic pain syndrome: Secondary | ICD-10-CM

## 2016-06-07 DIAGNOSIS — N521 Erectile dysfunction due to diseases classified elsewhere: Secondary | ICD-10-CM | POA: Diagnosis not present

## 2016-06-07 DIAGNOSIS — Z23 Encounter for immunization: Secondary | ICD-10-CM | POA: Diagnosis not present

## 2016-06-07 DIAGNOSIS — M5416 Radiculopathy, lumbar region: Secondary | ICD-10-CM

## 2016-06-07 DIAGNOSIS — K219 Gastro-esophageal reflux disease without esophagitis: Secondary | ICD-10-CM

## 2016-06-07 DIAGNOSIS — E1169 Type 2 diabetes mellitus with other specified complication: Secondary | ICD-10-CM

## 2016-06-07 DIAGNOSIS — E785 Hyperlipidemia, unspecified: Secondary | ICD-10-CM

## 2016-06-07 DIAGNOSIS — Z8719 Personal history of other diseases of the digestive system: Secondary | ICD-10-CM

## 2016-06-07 DIAGNOSIS — I10 Essential (primary) hypertension: Secondary | ICD-10-CM

## 2016-06-07 DIAGNOSIS — G8929 Other chronic pain: Secondary | ICD-10-CM

## 2016-06-07 DIAGNOSIS — M47816 Spondylosis without myelopathy or radiculopathy, lumbar region: Secondary | ICD-10-CM

## 2016-06-07 DIAGNOSIS — L84 Corns and callosities: Secondary | ICD-10-CM

## 2016-06-07 DIAGNOSIS — Z7984 Long term (current) use of oral hypoglycemic drugs: Secondary | ICD-10-CM

## 2016-06-07 DIAGNOSIS — Z8619 Personal history of other infectious and parasitic diseases: Secondary | ICD-10-CM

## 2016-06-07 DIAGNOSIS — M545 Low back pain: Secondary | ICD-10-CM | POA: Diagnosis not present

## 2016-06-07 DIAGNOSIS — Z79899 Other long term (current) drug therapy: Secondary | ICD-10-CM

## 2016-06-07 MED ORDER — KETOROLAC TROMETHAMINE 30 MG/ML IJ SOLN
30.0000 mg | Freq: Once | INTRAMUSCULAR | Status: AC
  Administered 2016-06-07: 30 mg via INTRAMUSCULAR

## 2016-06-07 MED ORDER — TADALAFIL 10 MG PO TABS: 10.0000 mg | ORAL_TABLET | ORAL | Status: DC | PRN

## 2016-06-07 MED ORDER — BACLOFEN 10 MG PO TABS: 10.0000 mg | ORAL_TABLET | Freq: Three times a day (TID) | ORAL | Status: DC | PRN

## 2016-06-07 NOTE — Patient Instructions (Signed)
Great job on the sugar control Try the baclofen for your back pain Your foot lesion is a corn. Get a foot cream with salicylic acid - will help. If no better, let me know and I will send you to the foot doctor. Try the Cialis I am sending you to GI for stomach pain and colonoscopy

## 2016-06-07 NOTE — Progress Notes (Signed)
   Subjective:    [name], male    DOB: [birth year], 55 y.o.   MRN: [id number]  HPI  Donald Palmer is here for back pain. Please see the A&P for the status of the pt's chronic medical problems.  ROS : per ROS section and in problem oriented charting. All other systems are negative.  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   Review of Systems  HENT: Negative for rhinorrhea, sinus pressure and sneezing.   Eyes: Negative for redness and itching.  Genitourinary:       Erectile dysfxn  Painful knot on L lateral posterior foot Fried food "tears stomach up" L lower lumbar back pain that goes down to buttock Back cramping Amputated finger stump stiff and gets swollen if exercises it    Objective:   Physical Exam  Constitutional: He is oriented to person, place, and time. He appears well-developed and well-nourished. No distress.  HENT:  Head: Normocephalic and atraumatic.  Right Ear: External ear normal.  Left Ear: External ear normal.  Nose: Nose normal.  Eyes: Conjunctivae and EOM are normal.  Musculoskeletal: Normal range of motion. He exhibits no edema or tenderness.  Muscle spasm on L lower lumbar paraspinal muscle - tender and reproduces pain when pressed  Neurological: He is oriented to person, place, and time.  Strength nl, gait nl  Skin: Skin is warm and dry. He is not diaphoretic.  Corn on L lateral posterior foot Tattoo under L eye and on L neck  Psychiatric: He has a normal mood and affect. Judgment and thought content normal. His speech is rapid and/or pressured and tangential. His speech is not delayed and not slurred. He is hyperactive. Cognition and memory are normal. He is communicative.          Assessment & Plan:

## 2016-06-07 NOTE — Assessment & Plan Note (Signed)
He has had SVR. Chronic Hep Cchanged to h/o chronic hep C

## 2016-06-07 NOTE — Assessment & Plan Note (Signed)
States controlled and not using the steroid nasal spray.

## 2016-06-07 NOTE — Assessment & Plan Note (Signed)
3rd Hep B vaccination today. Doubt he will not get immunity as his Hep B S Ab negative after one full 3 dose series and the first 2 shots of this second round.  Referral to GI for colonoscopy

## 2016-06-07 NOTE — Assessment & Plan Note (Addendum)
He has h/o GERD and is back on his PPI after being off while on Harvoni. He went to ED in May for recurrent upper abdominal pain and was Rx'd and H2B. He is not taking both, but intermittently taking one or the other. He has been picking up his PPI every 30 days for past 3 months so assume compliant with it. Diet changes - no fried foods, beer, or sweets and seems to be better. Has been on PPI since 2013 and had pain severe enough to send him to ED. Therefore, needs GI eval to see if EGD is indicated.  PLAN : GI referral

## 2016-06-07 NOTE — Assessment & Plan Note (Signed)
BP is well controlled on lisinopril 20. No SE. Last microalb done i n2015 - consider repeating next appt.  PLAN:  Cont current meds

## 2016-06-07 NOTE — Assessment & Plan Note (Signed)
A1C decreased from 9.2 to 7.7 in May on Actos 45 QD. He rarely checks CBG which is OK and denies lows. He has changed his diet - no sweets and no fried foods. UTD on all DM QI metrics.  PLAN:  Cont current meds

## 2016-06-07 NOTE — Assessment & Plan Note (Addendum)
States he only tried viagra and didn't help. Now states never tried Cialis and would like to try it. Now using ring in order to get erection strong enough for penetration. We discussed medical and mental causes. I do not feel that checking a testosterone level is indicated as he has no other signs / sxs of deficiency and the risks of supplementation are still not well elucidated.   PLAN : trial of cialis

## 2016-06-07 NOTE — Assessment & Plan Note (Signed)
He cont on his Simva 40. Most recent LDL 138. Will need to assess compliance next appt.  PLAN:  Cont current meds

## 2016-06-07 NOTE — Assessment & Plan Note (Signed)
FOOT :  He c/o painful knot on L posterior lateral foot. On exam, he has a corn. It isn't a location that typically rubs on shoe but explained assoc btw friction and corn. He has DM with neuropathy so rec cream with salicylic acid and if no better, will refer to podiatry.  PLAN FOOT: Salicylic acid cream Watch for shoe rubbing   BACK: He has had multifactorial chronic LBP. I had gotten him slowly off opioids and he remains on celebrex, baclofen, and gabapentin. He states the pain moved from the R lower back to the L lower lumbar spine and moves into the butt cheek. The back also cramps. The pain is 24 hr but cannot say if it is better sitting / standing / lying. He requests a shot like he got three months ago. I cannot see that we gave him any injection. Exam is c/w muscle spasm. I doubt any underlying pathology (fracture) that caused the spasm. Will treat conservatively and have pt F/u.  PLAN :  Toradol x1 IM injection Cont celebrex, baclofen Avoid opioids.

## 2016-06-14 ENCOUNTER — Encounter: Payer: Self-pay | Admitting: Gastroenterology

## 2016-06-21 ENCOUNTER — Ambulatory Visit: Payer: Commercial Managed Care - HMO | Admitting: Internal Medicine

## 2016-06-25 ENCOUNTER — Telehealth: Payer: Self-pay | Admitting: Dietician

## 2016-06-25 NOTE — Telephone Encounter (Signed)
Called patient about his diabetes self care: he says his sugar is "Wonderful- last check was  146 after noon. He is eating right, did not go to the country for [place] because of sweets and other foods he doesn't;t want to eat.  He asked about cream for his corn. I read Dr. Zenovia Jarred instructions from his last visit to him. We discussed that he may want to call his foot doctor if needed or [place] if he has questions or concerns.  Note that he is high risk due to cigarette smoking and should be seeing a podiatrist ongoing for foot care. He has seen two different podiatrists in past year, last note was from 12/2015 and he was to follow up in 1 week and did not.  Explained this to him and He agreed to call his foot doctor.

## 2016-06-27 ENCOUNTER — Ambulatory Visit: Payer: Commercial Managed Care - HMO | Admitting: Internal Medicine

## 2016-06-29 DIAGNOSIS — S90852A Superficial foreign body, left foot, initial encounter: Secondary | ICD-10-CM | POA: Diagnosis not present

## 2016-07-07 IMAGING — RF DG FINGER INDEX 2+V*R*
1 series · 2 of 2 positions shown · non-contrast
Comparison: 03/26/2016

FLUOROSCOPY TIME:  0 minutes 7 seconds

Images obtained: 2

CLINICAL DATA: Repair of near amputation RIGHT index finger

EXAM:
DG C-ARM 1-60 MIN-NO REPORT; RIGHT INDEX FINGER 2+V

[Series 1: run · 2 of 2 slices shown]
[im 1/2]
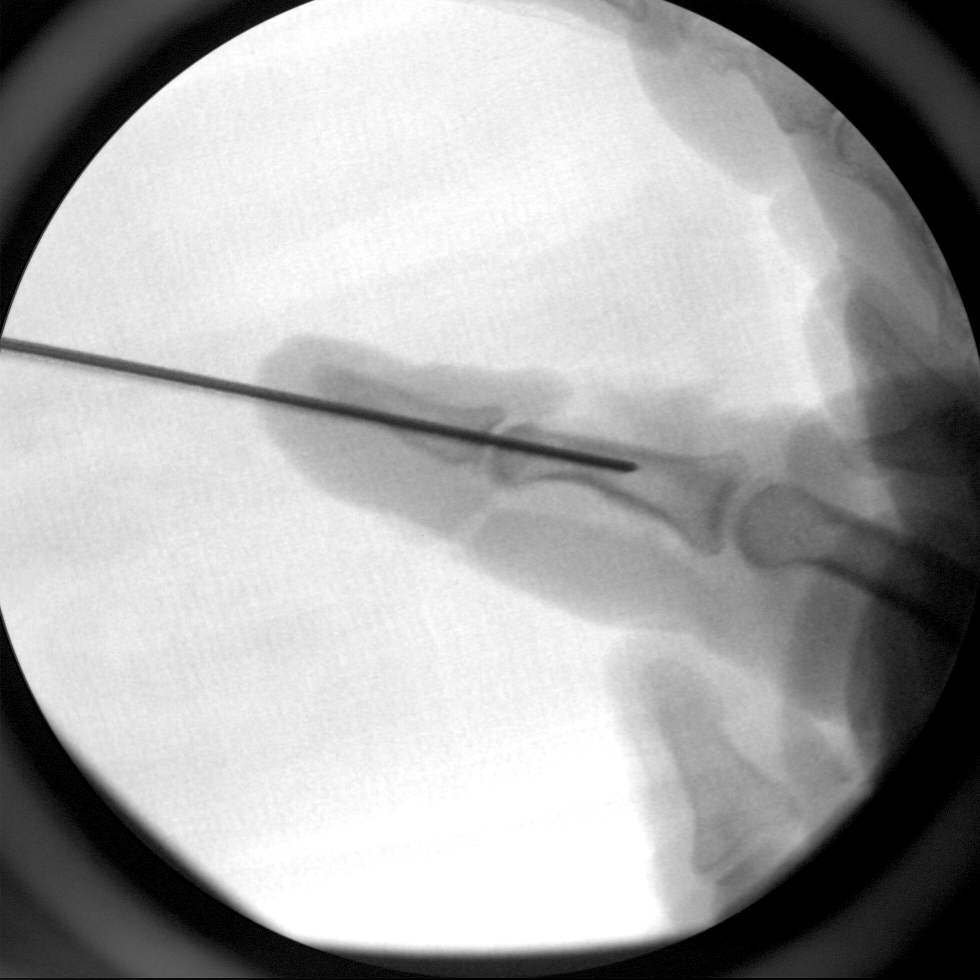
[im 2/2]
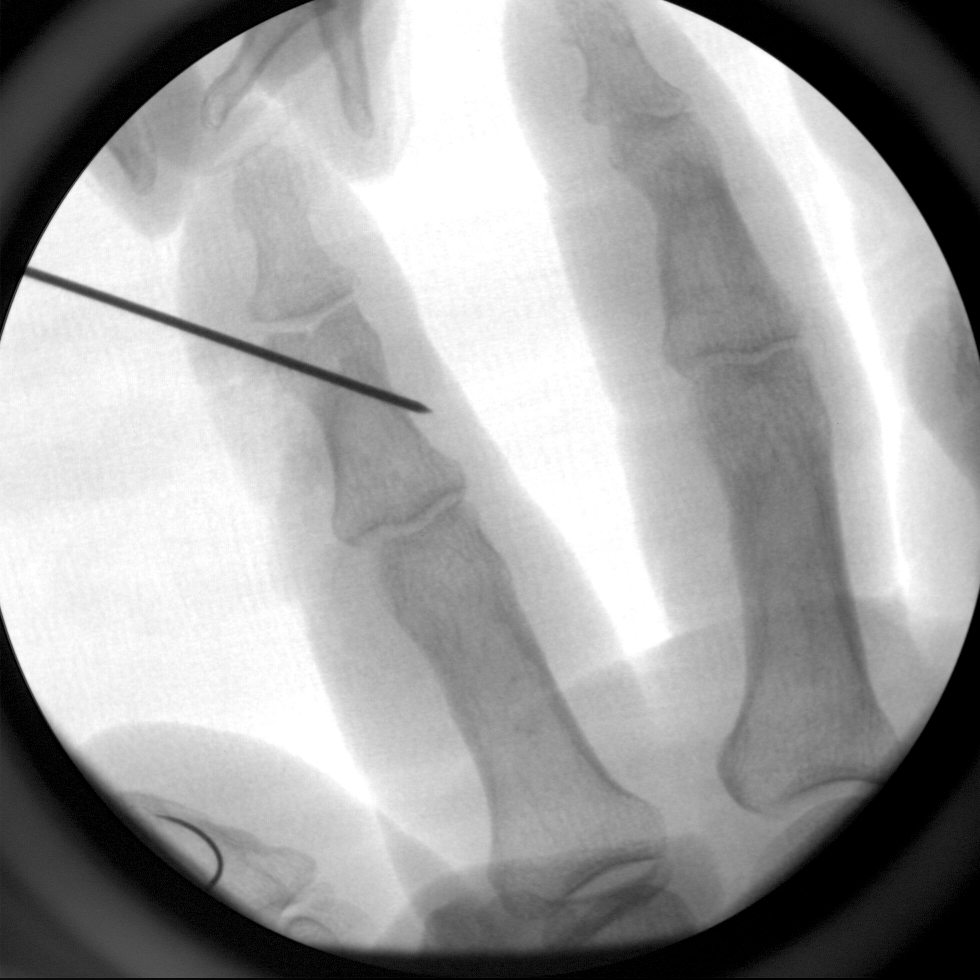

[2 of 2 positions shown; findings below may reference images not displayed]

FINDINGS: Images demonstrate placement of a K-wire across a reduced
intra-articular fracture of the head of the middle phalanx RIGHT
index finger.

Joint spaces appear preserved.
IMPRESSION: Post K-wire fixation of a reduced intra-articular fracture at head
of middle phalanx RIGHT index finger.

## 2016-07-10 ENCOUNTER — Encounter: Payer: Commercial Managed Care - HMO | Admitting: Physical Medicine & Rehabilitation

## 2016-07-11 ENCOUNTER — Ambulatory Visit: Payer: Commercial Managed Care - HMO | Admitting: Physical Medicine & Rehabilitation

## 2016-07-23 DIAGNOSIS — L089 Local infection of the skin and subcutaneous tissue, unspecified: Secondary | ICD-10-CM | POA: Diagnosis not present

## 2016-08-01 ENCOUNTER — Encounter: Payer: Self-pay | Admitting: Physical Medicine & Rehabilitation

## 2016-08-01 ENCOUNTER — Encounter
Payer: Commercial Managed Care - HMO | Attending: Physical Medicine & Rehabilitation | Admitting: Physical Medicine & Rehabilitation

## 2016-08-01 VITALS — BP 130/93 | HR 88 | Resp 16

## 2016-08-01 DIAGNOSIS — M5136 Other intervertebral disc degeneration, lumbar region: Secondary | ICD-10-CM | POA: Diagnosis not present

## 2016-08-01 DIAGNOSIS — M545 Low back pain: Secondary | ICD-10-CM | POA: Insufficient documentation

## 2016-08-01 DIAGNOSIS — M47816 Spondylosis without myelopathy or radiculopathy, lumbar region: Secondary | ICD-10-CM

## 2016-08-01 DIAGNOSIS — E1142 Type 2 diabetes mellitus with diabetic polyneuropathy: Secondary | ICD-10-CM | POA: Diagnosis not present

## 2016-08-01 DIAGNOSIS — G709 Myoneural disorder, unspecified: Secondary | ICD-10-CM | POA: Diagnosis not present

## 2016-08-01 DIAGNOSIS — I10 Essential (primary) hypertension: Secondary | ICD-10-CM | POA: Insufficient documentation

## 2016-08-01 DIAGNOSIS — E1144 Type 2 diabetes mellitus with diabetic amyotrophy: Secondary | ICD-10-CM | POA: Diagnosis not present

## 2016-08-01 DIAGNOSIS — G8929 Other chronic pain: Secondary | ICD-10-CM | POA: Diagnosis not present

## 2016-08-01 NOTE — Progress Notes (Signed)
Subjective:    [name], male    DOB: September 29, 1961, 55 y.o.   MRN: [id number]  HPI   Donald Palmer is here in follow up of his chronic back pain. He is still having low back pain which tends to shift from left to right. He got a silver Paediatric nurse through his [employer]. He began using the pool just recently where he's done some swimming and general stretching.   He had a recent finger infection which required surgery. He tells me his sugars are under better control.   Pain Inventory Average Pain 9 Pain Right Now 9 My pain is sharp, burning, stabbing and aching  In the last 24 hours, has pain interfered with the following? General activity 0 Relation with others 0 Enjoyment of life 0 What TIME of day is your pain at its worst? morning Sleep (in general) NA  Pain is worse with: walking, bending, sitting and standing Pain improves with: not answered Relief from Meds: non narcotic from [facility]  Mobility how many minutes can you walk? 30 ability to climb steps?  no do you drive?  yes  Function not employed: date last employed .  Neuro/Psych weakness trouble walking spasms dizziness  Prior Studies Any changes since last visit?  yes  Right index finger tip amputation  Physicians involved in your care Any changes since last visit?  no   Family History  Problem Relation Age of Onset  . Diabetes Mother   . Hypertension Mother   . Hyperlipidemia Mother   . Diabetes Brother   . Hypertension Brother   . Heart attack Neg Hx   . Sudden death Neg Hx    Social History   Social History  . Marital status: Single    Spouse name: N/A  . Number of children: N/A  . Years of education: N/A   Social History Main Topics  . Smoking status: Current Some Day Smoker    Packs/day: 0.10    Types: Cigarettes, Cigars  . Smokeless tobacco: Never Used     Comment: 2 cigs/week  . Alcohol use No  . Drug use:     Frequency: 1.0 time per week    Types: Marijuana   Comment: rarely  . Sexual activity: Yes    Birth control/ protection: None   Other Topics Concern  . None   Social History Narrative   Prior incarceration. Tattoos, inc ones obtained in prison.    On disability since 09/2013   Past Surgical History:  Procedure Laterality Date  . I&D EXTREMITY Right 05/15/2016   Procedure: RIGHT INDEX FINGER FIRST THROUGH  MIDDLE PHALYNX  AMPUTATION;  Surgeon: [name], MD;  Location: [facility];  Service: Orthopedics;  Laterality: Right;  . OPEN REDUCTION INTERNAL FIXATION (ORIF) DISTAL PHALANX Right 04/02/2016   Procedure: RIGHT INDEX FINGER REPAIR;  Surgeon: [name], MD;  Location: [facility];  Service: Orthopedics;  Laterality: Right;   Past Medical History:  Diagnosis Date  . Diabetes mellitus 2007  . Hepatitis C    [id number]  . Hypertension goal BP (blood pressure) < 140/80   . Neuromuscular disorder ([place])   . Pancreatitis 01/09/2012   BP (!) 130/93 (BP Location: Right Arm, Patient Position: Sitting, Cuff Size: Large)   Pulse 88   Resp 16   SpO2 98%   Opioid Risk Score:   Fall Risk Score:  `1  Depression screen [facility] 2/9  Depression screen [facility] 2/9 08/01/2016 06/07/2016 05/23/2016 05/23/2016 03/01/2016  01/06/2016 12/01/2015  Decreased Interest 0 0 0 0 0 0 0  Down, Depressed, Hopeless 0 0 0 0 0 0 0  PHQ - 2 Score 0 0 0 0 0 0 0  Altered sleeping - - - - - - -  Tired, decreased energy - - - - - - -  Change in appetite - - - - - - -  Feeling bad or failure about yourself  - - - - - - -  Trouble concentrating - - - - - - -  Moving slowly or fidgety/restless - - - - - - -  Suicidal thoughts - - - - - - -  PHQ-9 Score - - - - - - -  Some recent data might be hidden    Review of Systems  Constitutional: Positive for unexpected weight change.  Endocrine:       Blood sugar fluctuations  All other systems reviewed and are negative.      Objective:   Physical Exam General: Alert and oriented x 3, No apparent distress.  Cooperative with exam but needed to be re-directed numerous times to stay on task.  HEENT: Head is normocephalic, atraumatic, PERRLA, EOMI, sclera anicteric, oral mucosa pink and moist, dentition intact, ext ear canals clear,  Neck: Supple without JVD or lymphadenopathy  Heart: Reg rate and rhythm. No murmurs rubs or gallops  Chest: CTA bilaterally without wheezes, rales, or rhonchi; no distress  Abdomen: Soft, non-tender, non-distended, bowel sounds positive.  Extremities: No clubbing, cyanosis, or edema. Pulses are 2+  Skin: Clean and intact without signs of breakdown  Neuro: Pt is cognitively appropriate. Cranial nerves 2-12 are intact. Sensory exam remains decreased in a stocking glove distribution in both legs below the knees as well as the finger tips. He has some sensory loss along the medial right knee.. Reflexes are 2+ in all 4's. Fine motor coordination is intact. No tremors. Motor function is grossly 5/5 except for pain inhibition in the right leg.  Musculoskeletal: lumbar flexion to about 85 degrees. Facet maneuvers remain equivocal to positive on the right.  Muscle spasms Right lumbar paraspinals. SLR was equiovcal, FABER was negative.  Gait appeared generally normal. No gross spinal or pelvic symmetry abnl.  Psych: Pt's affect is appropriate. Pt is cooperative .    Assessment & Plan:   1. Chronic low back pain. 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 Relatively normal lumbar MRI from 2013  4. Diabetic polyneuropathy   Plan:  1. Hydrocodone prn per pcp (using rarely)  2. Improved glycemic control. 3. Facet stretches/exercises were reiterated. 4. His UDS was positive for THC---non-narcotic mgt only here.  5. Gabapentin resumed at 300mg  TID --this is helping 6. Follow up with me in about 6 months. 15 minutes of face to face patient care time were spent during this visit. All questions were encouraged and  answered.

## 2016-08-01 NOTE — Patient Instructions (Signed)
REMEMBER YOUR POSTURE WHEN YOU SIT AND STAND  TRY USING A HEATING PAD FOR YOUR LOW BACK   WHEN YOU EXERCISE, AVOID DOING ABDOMINAL OR BACK  EXERCISES WHERE YOUR FEET ARE NOT SUPPORTED.    CONTINUE WITH YOUR [employer] PROGRAM ALTHOUGH BE CAREFUL NOT TO "OVERDO" THINGS

## 2016-08-12 ENCOUNTER — Encounter (HOSPITAL_COMMUNITY): Payer: Self-pay | Admitting: Emergency Medicine

## 2016-08-12 ENCOUNTER — Emergency Department (HOSPITAL_COMMUNITY)
Admission: EM | Admit: 2016-08-12 | Discharge: 2016-08-12 | Disposition: A | Discharge status: Home / Self Care | Payer: Commercial Managed Care - HMO | Attending: Emergency Medicine | Admitting: Emergency Medicine

## 2016-08-12 DIAGNOSIS — Y999 Unspecified external cause status: Secondary | ICD-10-CM | POA: Diagnosis not present

## 2016-08-12 DIAGNOSIS — Y9389 Activity, other specified: Secondary | ICD-10-CM | POA: Diagnosis not present

## 2016-08-12 DIAGNOSIS — I10 Essential (primary) hypertension: Secondary | ICD-10-CM | POA: Insufficient documentation

## 2016-08-12 DIAGNOSIS — Z79899 Other long term (current) drug therapy: Secondary | ICD-10-CM | POA: Diagnosis not present

## 2016-08-12 DIAGNOSIS — Y92008 Other place in unspecified non-institutional (private) residence as the place of occurrence of the external cause: Secondary | ICD-10-CM | POA: Insufficient documentation

## 2016-08-12 DIAGNOSIS — X58XXXA Exposure to other specified factors, initial encounter: Secondary | ICD-10-CM | POA: Diagnosis not present

## 2016-08-12 DIAGNOSIS — F17210 Nicotine dependence, cigarettes, uncomplicated: Secondary | ICD-10-CM | POA: Diagnosis not present

## 2016-08-12 DIAGNOSIS — S0502XA Injury of conjunctiva and corneal abrasion without foreign body, left eye, initial encounter: Secondary | ICD-10-CM | POA: Diagnosis not present

## 2016-08-12 DIAGNOSIS — T1502XA Foreign body in cornea, left eye, initial encounter: Secondary | ICD-10-CM

## 2016-08-12 DIAGNOSIS — Z7984 Long term (current) use of oral hypoglycemic drugs: Secondary | ICD-10-CM | POA: Diagnosis not present

## 2016-08-12 DIAGNOSIS — E119 Type 2 diabetes mellitus without complications: Secondary | ICD-10-CM | POA: Diagnosis not present

## 2016-08-12 DIAGNOSIS — H5712 Ocular pain, left eye: Secondary | ICD-10-CM | POA: Diagnosis present

## 2016-08-12 MED ORDER — TETRACAINE HCL 0.5 % OP SOLN
2.0000 [drp] | Freq: Once | OPHTHALMIC | Status: AC
  Administered 2016-08-12: 2 [drp] via OPHTHALMIC
  Filled 2016-08-12: qty 4

## 2016-08-12 MED ORDER — TOBRAMYCIN 0.3 % OP SOLN
2.0000 [drp] | OPHTHALMIC | Status: DC
  Administered 2016-08-12: 2 [drp] via OPHTHALMIC
  Filled 2016-08-12: qty 5

## 2016-08-12 MED ORDER — HYDROCODONE-ACETAMINOPHEN 5-325 MG PO TABS
1.0000 | ORAL_TABLET | Freq: Once | ORAL | Status: AC
  Administered 2016-08-12: 1 via ORAL
  Filled 2016-08-12: qty 1

## 2016-08-12 MED ORDER — HYDROCODONE-ACETAMINOPHEN 5-325 MG PO TABS: 1.0000 | ORAL_TABLET | ORAL | 0 refills | Status: DC | PRN

## 2016-08-12 MED ORDER — FLUORESCEIN SODIUM 1 MG OP STRP
1.0000 | ORAL_STRIP | Freq: Once | OPHTHALMIC | Status: AC
  Administered 2016-08-12: 1 via OPHTHALMIC
  Filled 2016-08-12: qty 1

## 2016-08-12 NOTE — ED Notes (Signed)
Discharge instructions, follow up care, and rx x1 reviewed with patient. Patient verbalized understanding. 

## 2016-08-12 NOTE — Discharge Instructions (Signed)
Wear patch at all times until pain resolves.  Remove patch only for drop placement 3 times per day.  Vicodin for pain.

## 2016-08-12 NOTE — ED Provider Notes (Signed)
[facility] DEPT Provider Note   CSN: [id number] Arrival date & time: 08/12/16  7782     History   Chief Complaint Chief Complaint  Patient presents with  . Eye Pain    HPI Donald Palmer is a 55 y.o. male. He complains pain in his left eye. He was sweeping his porch and his gutters yesterday. He felt like something blew into his eye. He is difficulty keeping his eye open because of pain and blepharospasm since yesterday. No vision changes. He feels like it is red and swollen. Also feels like there is something still in his eye. HPI  Past Medical History:  Diagnosis Date  . Diabetes mellitus 2007  . Hepatitis C    [id number]  . Hypertension goal BP (blood pressure) < 140/80   . Neuromuscular disorder ([place])   . Pancreatitis 01/09/2012    Patient Active Problem List   Diagnosis Date Noted  . DDD (degenerative disc disease), lumbar 08/01/2016  . Diabetes mellitus type 2 in nonobese (HCC)   . Erectile dysfunction associated with type 2 diabetes mellitus ([place]) 03/01/2016  . Lumbar facet arthropathy 01/16/2016  . Diabetic radiculopathy ([place]) 09/08/2015  . Pre-ulcerative calluses 03/30/2015  . Atherosclerosis of aorta ([place]) 03/30/2015  . Chronic pain syndrome 03/18/2015  . Hyperlipidemia associated with type 2 diabetes mellitus ([place]) 12/22/2014  . Allergic rhinitis 10/22/2014  . Preventative health care 12/08/2012  . GERD (gastroesophageal reflux disease) 07/08/2012  . History of chronic hepatitis C 05/13/2012  . Diabetic polyradiculopathy associated with type 2 diabetes mellitus ([place]) 04/30/2012  . Hypertension 01/30/2012  . Type 2 diabetes mellitus with diabetic neuropathy ([place]) 12/17/2001    Past Surgical History:  Procedure Laterality Date  . I&D EXTREMITY Right 05/15/2016   Procedure: RIGHT INDEX FINGER FIRST THROUGH  MIDDLE PHALYNX  AMPUTATION;  Surgeon: [name], MD;  Location: [facility];  Service: Orthopedics;  Laterality: Right;  . OPEN REDUCTION INTERNAL  FIXATION (ORIF) DISTAL PHALANX Right 04/02/2016   Procedure: RIGHT INDEX FINGER REPAIR;  Surgeon: [name], MD;  Location: [facility];  Service: Orthopedics;  Laterality: Right;       Home Medications    Prior to Admission medications   Medication Sig Start Date End Date Taking? Authorizing Provider  baclofen (LIORESAL) 10 MG tablet Take 1 tablet (10 mg total) by mouth every 8 (eight) hours as needed for muscle spasms. 06/07/16   [name], MD  Blood Glucose Monitoring Suppl (ACCU-CHEK AVIVA PLUS) W/DEVICE KIT 1 each by Does not apply route 2 (two) times daily. To check blood sugar twice daily. diag code E11.40. Non insulin dependent. Please check with pt that this is the equipment he needs. 09/08/15   [name], MD  celecoxib (CELEBREX) 200 MG capsule Take 1 capsule (200 mg total) by mouth daily. 03/01/16 03/01/17  [name], MD  clotrimazole-betamethasone (LOTRISONE) cream Apply 1 application topically 2 (two) times daily. 04/20/15   [name], DPM  fluticasone (FLONASE) 50 MCG/ACT nasal spray Place 2 sprays into both nostrils daily. 05/24/15   [name], MD  gabapentin (NEURONTIN) 300 MG capsule Take 1 capsule (300 mg total) by mouth 3 (three) times daily. 03/19/16   [name], MD  glucose blood (FREESTYLE LITE) test strip Check blood sugar one time a day as instructed 02/10/16   [name], MD  HYDROcodone-acetaminophen (NORCO/VICODIN) 5-325 MG tablet Take 1 tablet by mouth every 4 (four) hours as needed. 08/12/16   Elta Guadeloupe  Jeneen Rinks, MD  Lancet Devices MISC 1 each by Does not apply route 2 (two) times daily. Please use to check blood sugar 2 times daily. diag code E11.40. noninsulin dependent 09/01/15   [name], MD  lisinopril (PRINIVIL,ZESTRIL) 20 MG tablet Take 1 tablet (20 mg total) by mouth daily. 01/06/16   [name], MD  menthol-zinc oxide (GOLD BOND) powder Apply 1 application topically daily as  needed (for pain).    Historical Provider, MD  omeprazole (PRILOSEC) 20 MG capsule Take 1 capsule (20 mg total) by mouth daily. 03/19/16   [name], MD  pioglitazone (ACTOS) 45 MG tablet Take 1 tablet (45 mg total) by mouth daily. 03/01/16   [name], MD  ranitidine (ZANTAC) 150 MG tablet Take 1 tablet (150 mg total) by mouth 2 (two) times daily. 04/28/16   [name], MD  simvastatin (ZOCOR) 40 MG tablet Take 1 tablet (40 mg total) by mouth every evening. 01/06/16 01/05/17  [name], MD  tadalafil (CIALIS) 10 MG tablet Take 1 tablet (10 mg total) by mouth as needed for erectile dysfunction. 06/07/16 06/07/17  [name], MD    Family History Family History  Problem Relation Age of Onset  . Diabetes Mother   . Hypertension Mother   . Hyperlipidemia Mother   . Diabetes Brother   . Hypertension Brother   . Heart attack Neg Hx   . Sudden death Neg Hx     Social History Social History  Substance Use Topics  . Smoking status: Current Some Day Smoker    Packs/day: 0.10    Types: Cigarettes, Cigars  . Smokeless tobacco: Never Used     Comment: 2 cigs/week  . Alcohol use No     Allergies   Metformin and related   Review of Systems Review of Systems  Constitutional: Negative for appetite change, chills, diaphoresis, fatigue and fever.  HENT: Negative for mouth sores, sore throat and trouble swallowing.   Eyes: Positive for photophobia, pain, redness and visual disturbance.  Respiratory: Negative for cough, chest tightness, shortness of breath and wheezing.   Cardiovascular: Negative for chest pain.  Gastrointestinal: Negative for abdominal distention, abdominal pain, diarrhea, nausea and vomiting.  Endocrine: Negative for polydipsia, polyphagia and polyuria.  Genitourinary: Negative for dysuria, frequency and hematuria.  Musculoskeletal: Negative for gait problem.  Skin: Negative for color change, pallor and rash.  Neurological: Negative for  dizziness, syncope, light-headedness and headaches.  Hematological: Does not bruise/bleed easily.  Psychiatric/Behavioral: Negative for behavioral problems and confusion.     Physical Exam Updated Vital Signs BP 120/78 (BP Location: Right Arm)   Pulse 66   Temp 97.4 F (36.3 C) (Oral)   Resp 18   Ht 5' 9" (1.753 m)   Wt 180 lb (81.6 kg)   SpO2 97%   BMI 26.58 kg/m   Physical Exam  Constitutional: He is oriented to person, place, and time. He appears well-developed and well-nourished. No distress.  HENT:  Head: Normocephalic.  Eyes: Conjunctivae are normal. Pupils are equal, round, and reactive to light. No scleral icterus.  Left eye with no lid edema or swelling. He T his lids tightly closed due to photosensitivity and blepharospasm. Tetracaine drops placed. There is a foreign body adjacent cornea on the sclera that is easily removed with the tip of the wet cotton tip applicator. No residual foreign bodies visually, or slit lamp. There is a small foreign body to 9:00 position on the cornea. This was  removed and the tip of an 18-gauge needle. Minimal residual corneal abrasion.  Neck: Normal range of motion. Neck supple. No thyromegaly present.  Cardiovascular: Normal rate and regular rhythm.  Exam reveals no gallop and no friction rub.   No murmur heard. Pulmonary/Chest: Effort normal and breath sounds normal. No respiratory distress. He has no wheezes. He has no rales.  Abdominal: Soft. Bowel sounds are normal. He exhibits no distension. There is no tenderness. There is no rebound.  Musculoskeletal: Normal range of motion.  Neurological: He is alert and oriented to person, place, and time.  Skin: Skin is warm and dry. No rash noted.  Psychiatric: He has a normal mood and affect. His behavior is normal.     ED Treatments / Results  Labs (all labs ordered are listed, but only abnormal results are displayed) Labs Reviewed - No data to display  EKG  EKG Interpretation None         Radiology No results found.  Procedures Procedures (including critical care time)  Medications Ordered in ED Medications  tobramycin (TOBREX) 0.3 % ophthalmic solution 2 drop (2 drops Left Eye Given 08/12/16 1011)  fluorescein ophthalmic strip 1 strip (1 strip Left Eye Given by Other 08/12/16 0938)  tetracaine (PONTOCAINE) 0.5 % ophthalmic solution 2 drop (2 drops Left Eye Given by Other 08/12/16 0865)  HYDROcodone-acetaminophen (NORCO/VICODIN) 5-325 MG per tablet 1 tablet (1 tablet Oral Given 08/12/16 1009)     Initial Impression / Assessment and Plan / ED Course  I have reviewed the triage vital signs and the nursing notes.  Pertinent labs & imaging results that were available during my care of the patient were reviewed by me and considered in my medical decision making (see chart for details).  Clinical Course    Foreign bodies removed from sclera, cornea. Eye was patched. Garamycin drops. Discharged home. Ophthalmology follow-up in 48 hours. Encouraged to wear the patch.  Final Clinical Impressions(s) / ED Diagnoses   Final diagnoses:  Corneal abrasion, left, initial encounter  Corneal foreign body, left, initial encounter    New Prescriptions New Prescriptions   HYDROCODONE-ACETAMINOPHEN (NORCO/VICODIN) 5-325 MG TABLET    Take 1 tablet by mouth every 4 (four) hours as needed.     Tanna Furry, MD 08/12/16 1018

## 2016-08-12 NOTE — ED Triage Notes (Signed)
Patient reports sweeping his porch and thinks something flew in his left eye. Patient reporting left eye pain.

## 2016-08-16 IMAGING — CR DG HAND COMPLETE 3+V*R*
3 series · 3 of 3 positions shown · non-contrast
Comparison: Right index finger radiographs performed 03/26/2016

CLINICAL DATA: Acute onset of worsening right index finger pain and
swelling, with drainage. Initial encounter.

EXAM:
RIGHT HAND - COMPLETE 3+ VIEW

[hand pa]
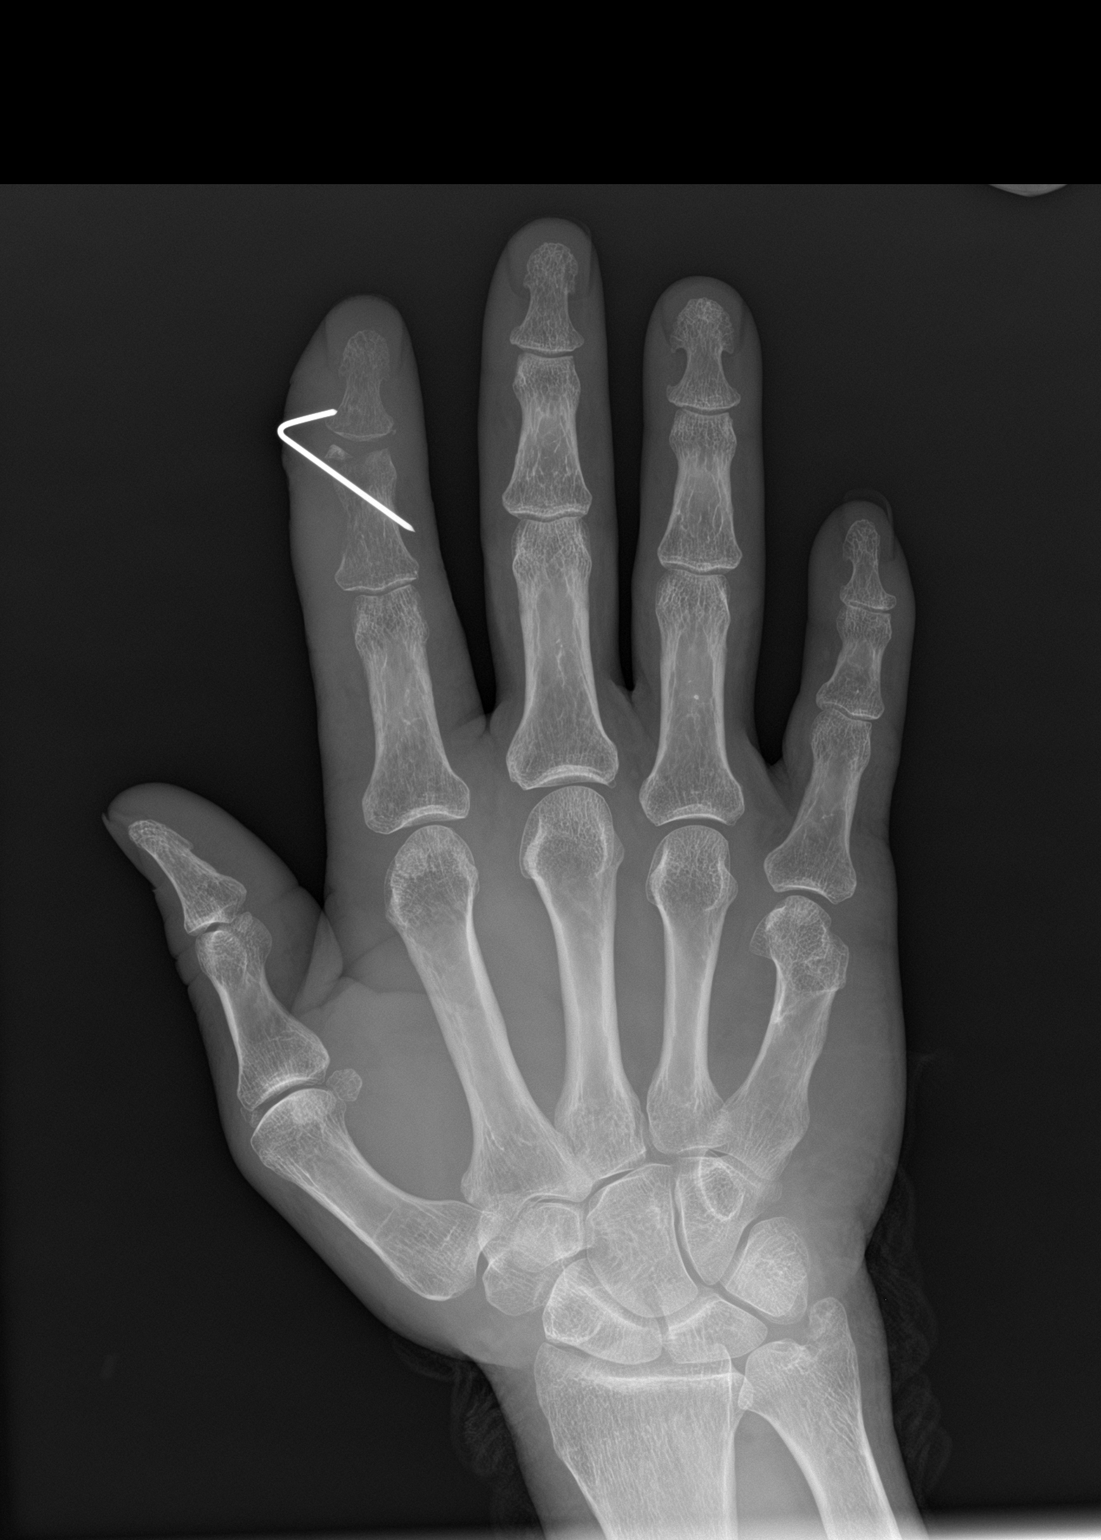

[hand obl]
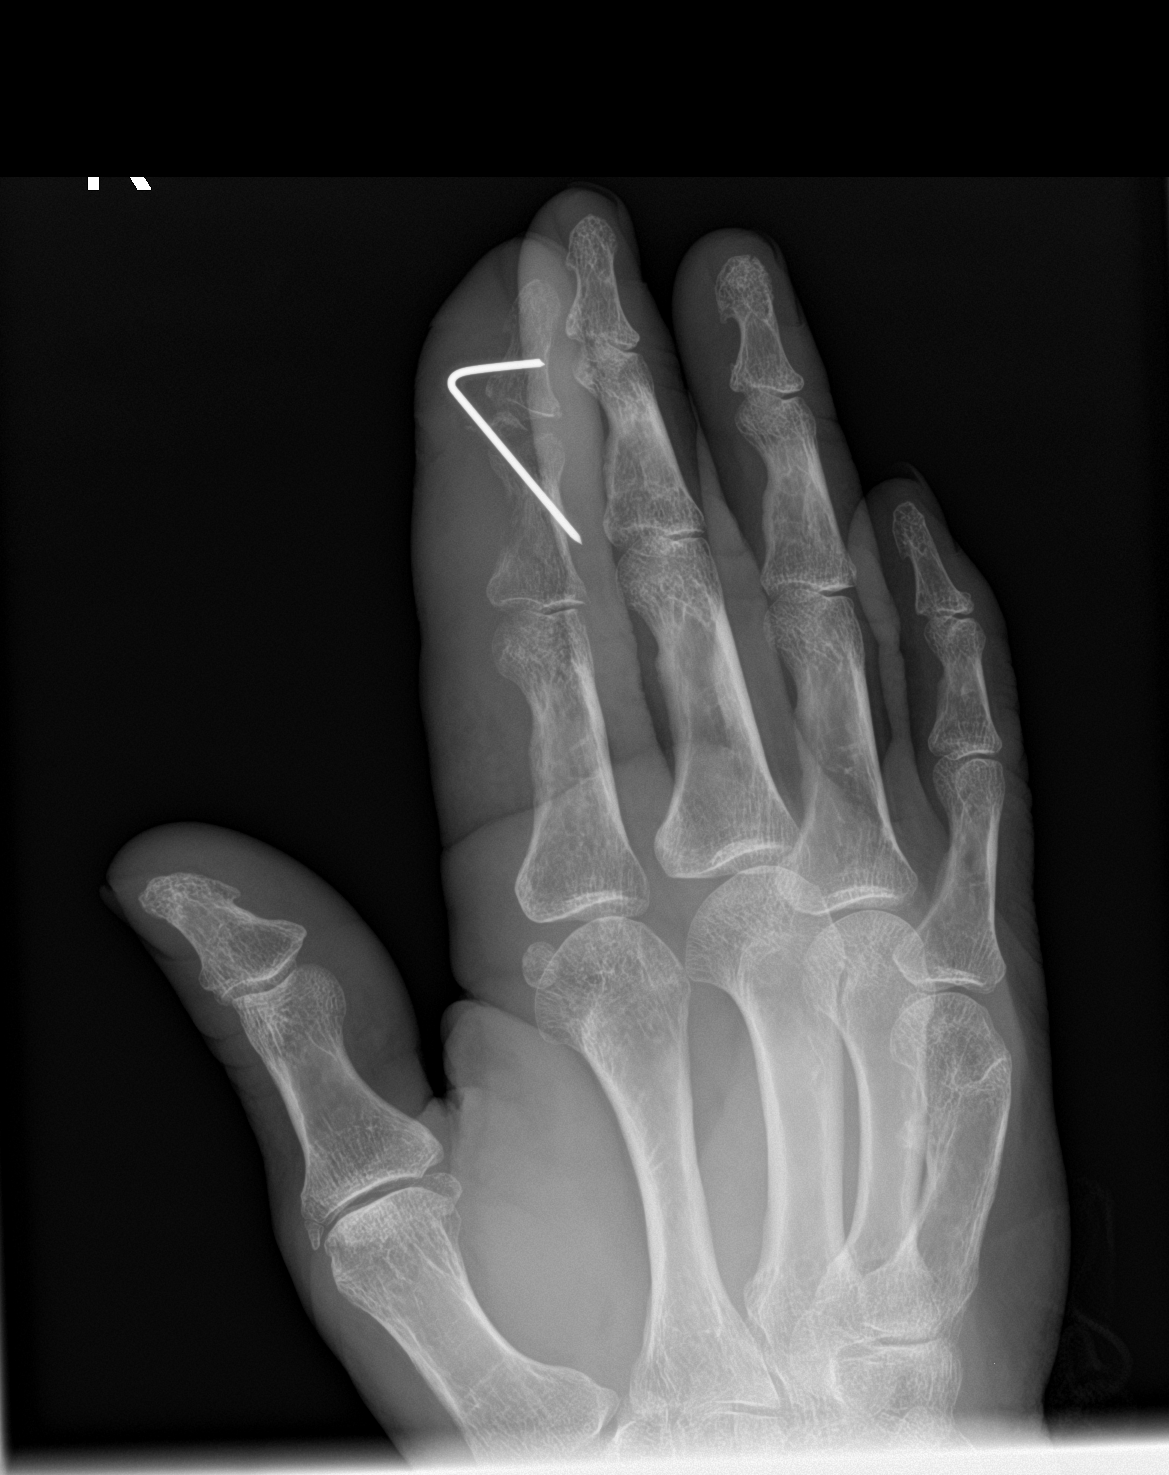

[hand lat]
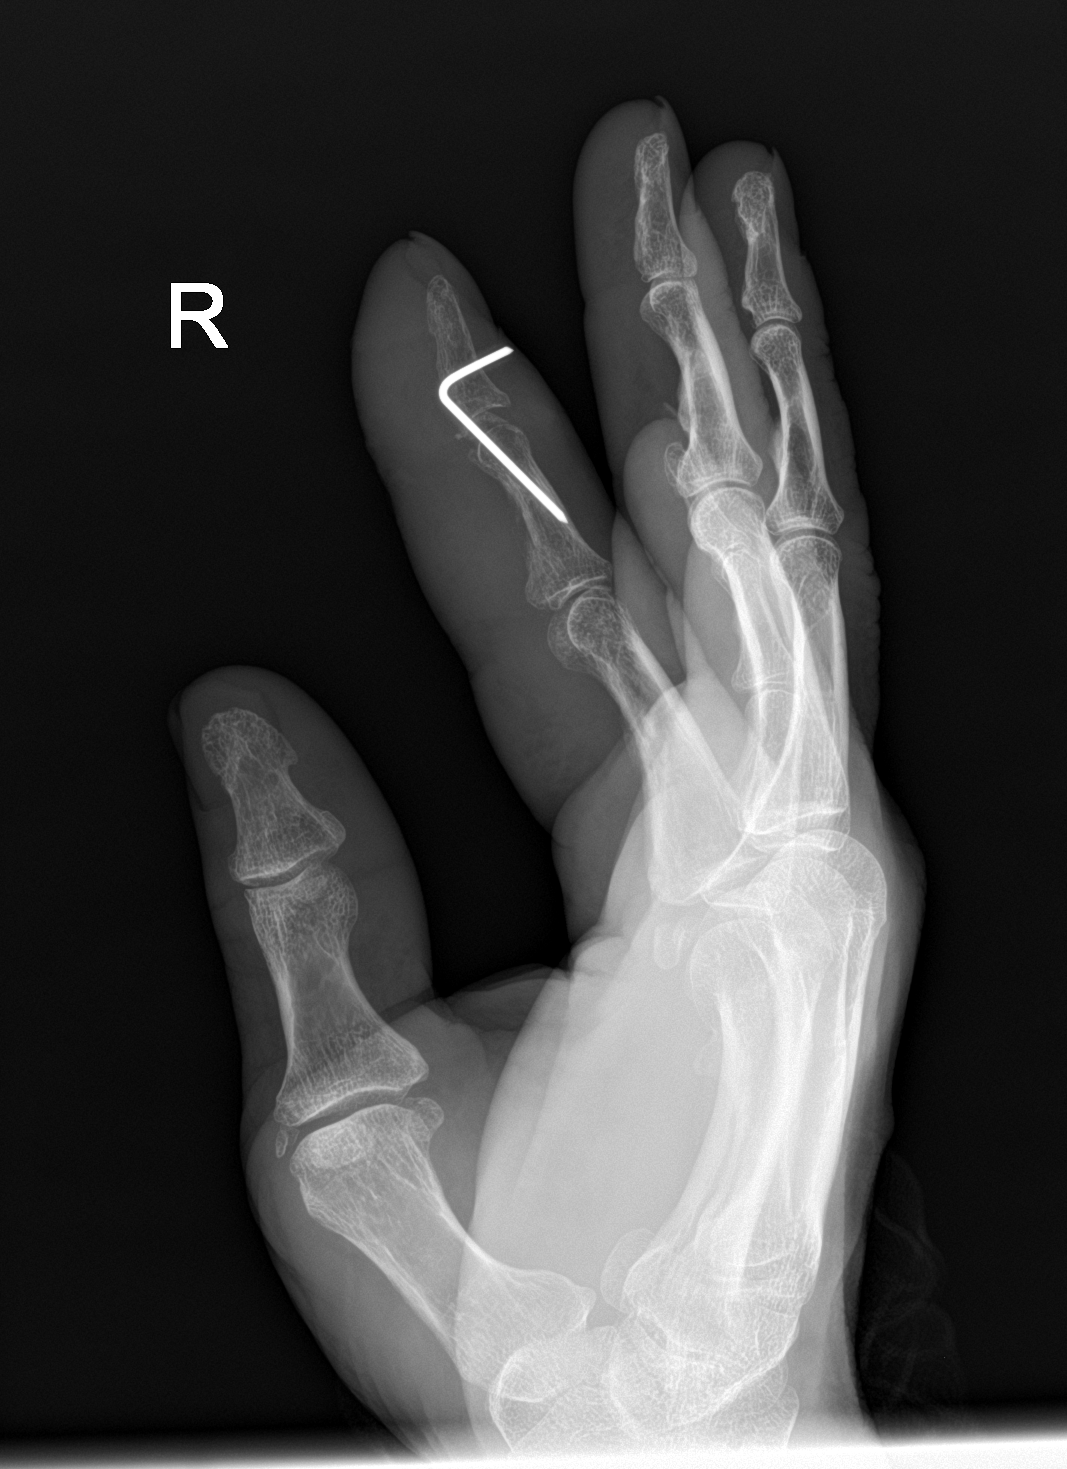

[3 of 3 positions shown; findings below may reference images not displayed]

FINDINGS: There is lucency about the distal aspect of the second middle
phalanx, at the site of the patient's pin, compatible with
osteomyelitis. Associated diffuse soft tissue swelling is noted
about the second digit. There is widening of the second distal
interphalangeal joint, which likely reflects underlying joint
infection.

Remaining visualized joint spaces are grossly preserved. There is
chronic deformity of the distal fifth metacarpal.
IMPRESSION: Lucency noted at the distal aspect of the second middle phalanx, at
the site of the patient's pain, likely reflecting osteomyelitis.
Associated diffuse soft tissue swelling about the second digit.
Widening of the second distal interphalangeal joint likely reflects
underlying joint infection.

## 2016-08-17 ENCOUNTER — Ambulatory Visit: Payer: Commercial Managed Care - HMO | Admitting: Gastroenterology

## 2016-09-12 ENCOUNTER — Ambulatory Visit: Payer: Commercial Managed Care - HMO | Admitting: Gastroenterology

## 2016-11-22 ENCOUNTER — Ambulatory Visit: Payer: Commercial Managed Care - HMO | Admitting: Internal Medicine

## 2016-12-19 ENCOUNTER — Other Ambulatory Visit: Payer: Self-pay | Admitting: Internal Medicine

## 2016-12-19 DIAGNOSIS — E1140 Type 2 diabetes mellitus with diabetic neuropathy, unspecified: Secondary | ICD-10-CM

## 2016-12-20 NOTE — Telephone Encounter (Signed)
Called pt - no answer; left message to give [place] a call back. Also sent message to front office.

## 2016-12-20 NOTE — Telephone Encounter (Signed)
Pls sch appt with me DM F/U. pls ask for a few date and time that he is available and I will come down to see him as I have no open CC slots. Thanls

## 2016-12-21 NOTE — Telephone Encounter (Addendum)
He said any day in the evening between 3 - 4 PM. Let me know the date and will call pt back.

## 2016-12-21 NOTE — Telephone Encounter (Signed)
Appt scheduled for Monday 1/8 at 3 PM.

## 2016-12-21 NOTE — Telephone Encounter (Signed)
3PM Mon, Tues, or WED next week - whenever we have staffing.

## 2016-12-24 ENCOUNTER — Encounter: Payer: Commercial Managed Care - HMO | Admitting: Internal Medicine

## 2016-12-25 ENCOUNTER — Other Ambulatory Visit: Payer: Self-pay | Admitting: Internal Medicine

## 2016-12-25 DIAGNOSIS — I152 Hypertension secondary to endocrine disorders: Secondary | ICD-10-CM

## 2016-12-26 ENCOUNTER — Encounter: Payer: Self-pay | Admitting: Internal Medicine

## 2016-12-26 ENCOUNTER — Ambulatory Visit (INDEPENDENT_AMBULATORY_CARE_PROVIDER_SITE_OTHER): Payer: Medicare HMO | Admitting: Internal Medicine

## 2016-12-26 VITALS — BP 119/70 | HR 85 | Temp 97.6°F | Ht 69.0 in | Wt 182.6 lb

## 2016-12-26 DIAGNOSIS — J301 Allergic rhinitis due to pollen: Secondary | ICD-10-CM

## 2016-12-26 DIAGNOSIS — Z8719 Personal history of other diseases of the digestive system: Secondary | ICD-10-CM

## 2016-12-26 DIAGNOSIS — N521 Erectile dysfunction due to diseases classified elsewhere: Secondary | ICD-10-CM

## 2016-12-26 DIAGNOSIS — E1142 Type 2 diabetes mellitus with diabetic polyneuropathy: Secondary | ICD-10-CM

## 2016-12-26 DIAGNOSIS — I10 Essential (primary) hypertension: Secondary | ICD-10-CM

## 2016-12-26 DIAGNOSIS — Z23 Encounter for immunization: Secondary | ICD-10-CM

## 2016-12-26 DIAGNOSIS — K219 Gastro-esophageal reflux disease without esophagitis: Secondary | ICD-10-CM

## 2016-12-26 DIAGNOSIS — Z7984 Long term (current) use of oral hypoglycemic drugs: Secondary | ICD-10-CM

## 2016-12-26 DIAGNOSIS — E1140 Type 2 diabetes mellitus with diabetic neuropathy, unspecified: Secondary | ICD-10-CM | POA: Diagnosis not present

## 2016-12-26 DIAGNOSIS — E1169 Type 2 diabetes mellitus with other specified complication: Secondary | ICD-10-CM

## 2016-12-26 DIAGNOSIS — F17210 Nicotine dependence, cigarettes, uncomplicated: Secondary | ICD-10-CM

## 2016-12-26 DIAGNOSIS — G894 Chronic pain syndrome: Secondary | ICD-10-CM

## 2016-12-26 DIAGNOSIS — J0101 Acute recurrent maxillary sinusitis: Secondary | ICD-10-CM

## 2016-12-26 DIAGNOSIS — E784 Other hyperlipidemia: Secondary | ICD-10-CM

## 2016-12-26 DIAGNOSIS — E785 Hyperlipidemia, unspecified: Secondary | ICD-10-CM

## 2016-12-26 DIAGNOSIS — E1149 Type 2 diabetes mellitus with other diabetic neurological complication: Secondary | ICD-10-CM | POA: Diagnosis not present

## 2016-12-26 DIAGNOSIS — Z0000 Encounter for general adult medical examination without abnormal findings: Secondary | ICD-10-CM

## 2016-12-26 DIAGNOSIS — D509 Iron deficiency anemia, unspecified: Secondary | ICD-10-CM

## 2016-12-26 DIAGNOSIS — Z8619 Personal history of other infectious and parasitic diseases: Secondary | ICD-10-CM

## 2016-12-26 DIAGNOSIS — J309 Allergic rhinitis, unspecified: Secondary | ICD-10-CM

## 2016-12-26 DIAGNOSIS — Z79899 Other long term (current) drug therapy: Secondary | ICD-10-CM

## 2016-12-26 LAB — GLUCOSE, CAPILLARY: GLUCOSE-CAPILLARY: 187 mg/dL — AB (ref 65–99)

## 2016-12-26 LAB — POCT GLYCOSYLATED HEMOGLOBIN (HGB A1C): HEMOGLOBIN A1C: 7.3

## 2016-12-26 MED ORDER — FLUTICASONE PROPIONATE 50 MCG/ACT NA SUSP: 2.0000 | Freq: Every day | NASAL | 11 refills | Status: DC

## 2016-12-26 NOTE — Patient Instructions (Addendum)
Great job on your blood pressure and sugar  When your pain is acting up, take the celebrex, baclofen, and gabapentin every day to help.  See me in 3 to 6 months

## 2016-12-27 DIAGNOSIS — E611 Iron deficiency: Secondary | ICD-10-CM | POA: Insufficient documentation

## 2016-12-27 NOTE — Progress Notes (Signed)
   Subjective:    [name], male    DOB: [birth year], 56 y.o.   MRN: [id number]  HPI  Donald Palmer is here for back pain F/U. Please see the A&P for the status of the pt's chronic medical problems.  ROS : per ROS section and in problem oriented charting. All other systems are negative.  PMHx, Soc hx, and / or Fam hx : Mother had abrupt onset of ABD pain which made her fall. She had surgery (?GB related?) and went to rehab to improve walking after the fall made her leg hurt.   Review of Systems  Constitutional: Negative for unexpected weight change.       Trying to exercise  HENT: Positive for rhinorrhea. Negative for sneezing.   Eyes: Negative for pain and itching.  Respiratory: Negative for cough.   Cardiovascular: Negative for leg swelling.  Gastrointestinal: Negative for abdominal distention and abdominal pain.  Genitourinary:       ED sxs  Musculoskeletal: Positive for back pain. Negative for gait problem.  Allergic/Immunologic: Positive for environmental allergies.       Objective:   Physical Exam  Constitutional: He is oriented to person, place, and time. He appears well-developed and well-nourished. No distress.  HENT:  Head: Normocephalic and atraumatic.  Right Ear: External ear normal.  Left Ear: External ear normal.  Nose: Nose normal.  Eyes: Conjunctivae and EOM are normal. Right eye exhibits no discharge. Left eye exhibits no discharge. No scleral icterus.  Cardiovascular: Normal rate, regular rhythm and normal heart sounds.   No murmur heard. Pulmonary/Chest: Effort normal and breath sounds normal. No respiratory distress. He has no wheezes.  Neurological: He is alert and oriented to person, place, and time.  Skin: Skin is warm and dry. No rash noted. He is not diaphoretic. No erythema. No pallor.  Multiple tattoos  Psychiatric: Judgment and thought content normal. His mood appears not anxious. His affect is not angry, not blunt, not labile and not  inappropriate. His speech is rapid and/or pressured and tangential. His speech is not delayed and not slurred. He is hyperactive and combative. He is not agitated and not aggressive. Cognition and memory are normal. He does not exhibit a depressed mood. He is communicative.          Assessment & Plan:

## 2016-12-27 NOTE — Assessment & Plan Note (Signed)
He asked about the status of his hepatitis C because a sexual partner had looked at his most recent AVS and saw hepatitis on his paperwork. He had completed [place] in 2016 and his viral load afterwords was undetectable. I printed off his lab work and handed it to him so that he could show it to his sexual partner. I reviewed his last infectious disease note which requested a right upper outer ultrasound which he completed and was normal.  PLAN : Printed off lab results and handed to patient

## 2016-12-27 NOTE — Assessment & Plan Note (Signed)
This problem is chronic but unchanged. Cialis was too costly. He uses a penile ring and some OTC meds and is happy with the results.

## 2016-12-27 NOTE — Assessment & Plan Note (Signed)
This is a new problem. HgB about 11.5. MCV low. Stool cards negative. In 20014, 20015, and 20016. Will need ferritin next blood draw. Consider thalassemia.   PLAN : ferrtitin next draw, stool cards, electrophoresis next draw.

## 2016-12-27 NOTE — Assessment & Plan Note (Signed)
This problem is chronic and stable. His A1C trend is 9.2 - 7.7 - 7.3 today. He is on Actos 45 and is tolerating it well without side effects. He has a glucometer and he brought it today that there are only a couple of values. All metrics are up-to-date with the exception of vaccination which we are starting to complete today. Since he is only on oral therapy and his A1c is almost at goal he does not have to check his CBG on a regular basis. I will see him back and 3-6 months.  PLAN:  Cont current meds

## 2016-12-27 NOTE — Assessment & Plan Note (Signed)
This problem is chronic and stable. He still uses flonase. Sxs overall controlled.  PLAN:  Cont current meds

## 2016-12-27 NOTE — Assessment & Plan Note (Signed)
This problem is chronic and stable. He takes lisinopril 20 mg daily and has no side effects. His blood pressure is well controlled.  PLAN:  Cont current meds   BP Readings from Last 3 Encounters:  12/26/16 119/70  08/12/16 120/78  08/01/16 (!) 130/93

## 2016-12-27 NOTE — Assessment & Plan Note (Signed)
This problem is chronic and improved. When his pain had decreased, he stopped taking his PPI daily. At that point his pain increased. Greasy food is his trigger. He resumed taking his PPI daily and is now symptom-free. He continues to try to modify his diet.  PLAN:  Cont current meds

## 2016-12-27 NOTE — Assessment & Plan Note (Signed)
This problem is chronic and stable. He is on simvastatin 40 mg daily. He takes this without side effects. This is for primary prevention.  PLAN:  Cont current meds

## 2016-12-27 NOTE — Assessment & Plan Note (Signed)
He got PCV 13 today.  He is agreeable to cont stool cards over colonoscopy.

## 2016-12-27 NOTE — Assessment & Plan Note (Addendum)
This problem is chronic and worsened. For the past month or so his back pain has increased. He did notthing to set off this flare. The pain is his typical pain radiating from the back to the right side. His right toe is numb. He has been taking his Celebrex as needed averaging about 3 week. He is taking his baclofen every day. He is also taking gabapentin as needed. Dr. Tessa Lerner had recommended he not wear a back brace and he has complied with this recommendation. He is trying to exercise as much as possible. His exam is stable today.  He had seen podiatry for the corn on his left posterior lateral foot in the corn had been shaved off and he is now pain-free.  Plan : I encouraged him to take his Celebrex, baclofen, and gabapentin as scheduled while his back pain is worse and daily. I discussed that when the back pain eases off he may return to as needed dosing.

## 2017-01-21 ENCOUNTER — Encounter: Payer: Medicare HMO | Attending: Physical Medicine & Rehabilitation | Admitting: Physical Medicine & Rehabilitation

## 2017-02-26 ENCOUNTER — Other Ambulatory Visit: Payer: Self-pay | Admitting: Internal Medicine

## 2017-02-26 DIAGNOSIS — E1140 Type 2 diabetes mellitus with diabetic neuropathy, unspecified: Secondary | ICD-10-CM

## 2017-03-09 ENCOUNTER — Encounter (HOSPITAL_COMMUNITY): Payer: Self-pay | Admitting: Emergency Medicine

## 2017-03-09 ENCOUNTER — Emergency Department (HOSPITAL_COMMUNITY)
Admission: EM | Admit: 2017-03-09 | Discharge: 2017-03-09 | Disposition: A | Discharge status: Home / Self Care | Payer: Medicare HMO | Attending: Emergency Medicine | Admitting: Emergency Medicine

## 2017-03-09 DIAGNOSIS — G8929 Other chronic pain: Secondary | ICD-10-CM | POA: Diagnosis not present

## 2017-03-09 DIAGNOSIS — M5441 Lumbago with sciatica, right side: Secondary | ICD-10-CM | POA: Diagnosis not present

## 2017-03-09 DIAGNOSIS — E1140 Type 2 diabetes mellitus with diabetic neuropathy, unspecified: Secondary | ICD-10-CM | POA: Diagnosis not present

## 2017-03-09 DIAGNOSIS — I10 Essential (primary) hypertension: Secondary | ICD-10-CM | POA: Insufficient documentation

## 2017-03-09 DIAGNOSIS — F17210 Nicotine dependence, cigarettes, uncomplicated: Secondary | ICD-10-CM | POA: Diagnosis not present

## 2017-03-09 DIAGNOSIS — M549 Dorsalgia, unspecified: Secondary | ICD-10-CM | POA: Diagnosis present

## 2017-03-09 HISTORY — DX: Other chronic pain: G89.29

## 2017-03-09 HISTORY — DX: Dorsalgia, unspecified: M54.9

## 2017-03-09 MED ORDER — PREDNISONE 20 MG PO TABS
60.0000 mg | ORAL_TABLET | Freq: Once | ORAL | Status: AC
  Administered 2017-03-09: 60 mg via ORAL
  Filled 2017-03-09: qty 3

## 2017-03-09 MED ORDER — PREDNISONE 10 MG PO TABS: ORAL_TABLET | ORAL | 0 refills | Status: DC

## 2017-03-09 MED ORDER — KETOROLAC TROMETHAMINE 30 MG/ML IJ SOLN
60.0000 mg | Freq: Once | INTRAMUSCULAR | Status: AC
  Administered 2017-03-09: 60 mg via INTRAMUSCULAR
  Filled 2017-03-09: qty 2

## 2017-03-09 NOTE — ED Notes (Signed)
Pt departed in NAD.  

## 2017-03-09 NOTE — Discharge Instructions (Signed)
Please read instructions below. Talk with your primary care provider about any new medications. Return for inability to control your bladder or bowels, weakness in your legs, fever, vomiting.

## 2017-03-09 NOTE — ED Provider Notes (Signed)
[facility] DEPT Provider Note   CSN: [id number] Arrival date & time: 03/09/17  0453     History   Chief Complaint Chief Complaint  Patient presents with  . Back Pain    HPI Donald Palmer is a 56 y.o. male.  Pt w PMHx chronic back pain w sciatica, DM, HTN, Hep C, presents w increased back pain that began Monday after he twisted when getting out of bed. Reports R sided pain w radiation down R leg. Denies weakness, bladder or bowel incontinence, HA, vision changes.      Past Medical History:  Diagnosis Date  . Back pain, chronic   . Diabetes mellitus 2007  . Hepatitis C    [id number]  . Hypertension goal BP (blood pressure) < 140/80   . Neuromuscular disorder ([place])   . Pancreatitis 01/09/2012    Patient Active Problem List   Diagnosis Date Noted  . Microcytic anemia 12/27/2016  . DDD (degenerative disc disease), lumbar 08/01/2016  . Diabetes mellitus type 2 in nonobese (HCC)   . Erectile dysfunction associated with type 2 diabetes mellitus ([place]) 03/01/2016  . Lumbar facet arthropathy 01/16/2016  . Diabetic radiculopathy ([place]) 09/08/2015  . Pre-ulcerative calluses 03/30/2015  . Atherosclerosis of aorta ([place]) 03/30/2015  . Chronic pain syndrome 03/18/2015  . Hyperlipidemia associated with type 2 diabetes mellitus ([place]) 12/22/2014  . Allergic rhinitis 10/22/2014  . Preventative health care 12/08/2012  . GERD (gastroesophageal reflux disease) 07/08/2012  . History of chronic hepatitis C 05/13/2012  . Diabetic polyradiculopathy associated with type 2 diabetes mellitus ([place]) 04/30/2012  . Hypertension 01/30/2012  . Type 2 diabetes mellitus with diabetic neuropathy ([place]) 12/17/2001    Past Surgical History:  Procedure Laterality Date  . I&D EXTREMITY Right 05/15/2016   Procedure: RIGHT INDEX FINGER FIRST THROUGH  MIDDLE PHALYNX  AMPUTATION;  Surgeon: [name], MD;  Location: [facility];  Service: Orthopedics;  Laterality: Right;  . OPEN REDUCTION INTERNAL FIXATION  (ORIF) DISTAL PHALANX Right 04/02/2016   Procedure: RIGHT INDEX FINGER REPAIR;  Surgeon: [name], MD;  Location: [facility];  Service: Orthopedics;  Laterality: Right;       Home Medications    Prior to Admission medications   Medication Sig Start Date End Date Taking? Authorizing Provider  ACCU-CHEK AVIVA PLUS test strip Check blood sugar one time a day as instructed 02/27/17   [name], MD  baclofen (LIORESAL) 10 MG tablet Take 1 tablet (10 mg total) by mouth every 8 (eight) hours as needed for muscle spasms. 06/07/16   [name], MD  Blood Glucose Monitoring Suppl (ACCU-CHEK AVIVA PLUS) W/DEVICE KIT 1 each by Does not apply route 2 (two) times daily. To check blood sugar twice daily. diag code E11.40. Non insulin dependent. Please check with pt that this is the equipment he needs. 09/08/15   [name], MD  clotrimazole-betamethasone (LOTRISONE) cream Apply 1 application topically 2 (two) times daily. 04/20/15   [name], DPM  fluticasone (FLONASE) 50 MCG/ACT nasal spray Place 2 sprays into both nostrils daily. 12/26/16   [name], MD  gabapentin (NEURONTIN) 300 MG capsule Take 1 capsule (300 mg total) by mouth 3 (three) times daily. 03/19/16   [name], MD  HYDROcodone-acetaminophen (NORCO/VICODIN) 5-325 MG tablet Take 1 tablet by mouth every 4 (four) hours as needed. 08/12/16   Tanna Furry, MD  Lancet Devices MISC 1 each by Does not apply route 2 (two) times daily. Please use to check  blood sugar 2 times daily. diag code E11.40. noninsulin dependent 09/01/15   [name], MD  lisinopril (PRINIVIL,ZESTRIL) 20 MG tablet Take 1 tablet (20 mg total) by mouth daily. 12/25/16   [name], MD  menthol-zinc oxide (GOLD BOND) powder Apply 1 application topically daily as needed (for pain).    Historical Provider, MD  omeprazole (PRILOSEC) 20 MG capsule Take 1 capsule (20 mg total) by mouth daily. 03/19/16    [name], MD  pioglitazone (ACTOS) 45 MG tablet Take 1 tablet (45 mg total) by mouth daily. 03/01/16   [name], MD  predniSONE (DELTASONE) 10 MG tablet Take 6 tablets for 3 days, then 4 tablets for 3 days, then 2 tablets for 3 days, then 1 tablet for 3 days. 03/09/17   [place] Nicole Russo, PA-C  ranitidine (ZANTAC) 150 MG tablet Take 1 tablet (150 mg total) by mouth 2 (two) times daily. 04/28/16   [name], MD  simvastatin (ZOCOR) 40 MG tablet Take 1 tablet (40 mg total) by mouth every evening. 12/20/16   [name], MD  tadalafil (CIALIS) 10 MG tablet Take 1 tablet (10 mg total) by mouth as needed for erectile dysfunction. 06/07/16 06/07/17  [name], MD    Family History Family History  Problem Relation Age of Onset  . Diabetes Mother   . Hypertension Mother   . Hyperlipidemia Mother   . Diabetes Brother   . Hypertension Brother   . Heart attack Neg Hx   . Sudden death Neg Hx     Social History Social History  Substance Use Topics  . Smoking status: Current Some Day Smoker    Packs/day: 0.10    Types: Cigarettes, Cigars  . Smokeless tobacco: Never Used     Comment: 2 cigs/week  . Alcohol use No     Allergies   Metformin and related   Review of Systems Review of Systems  Constitutional: Negative for chills and fever.  Eyes: Negative for visual disturbance.  Respiratory: Negative for shortness of breath.   Cardiovascular: Negative for chest pain.  Gastrointestinal: Negative for abdominal pain, nausea and vomiting.  Genitourinary: Negative for difficulty urinating and dysuria.  Musculoskeletal: Positive for back pain. Negative for neck pain.  Neurological: Positive for numbness (baseline numbness down back of R leg). Negative for weakness and headaches.  All other systems reviewed and are negative.    Physical Exam Updated Vital Signs BP (!) 170/98   Pulse 75   Temp 97.7 F (36.5 C) (Oral)   Resp 17   Ht 5' 9"  (1.753 m)    Wt 81.6 kg   SpO2 98%   BMI 26.58 kg/m   Physical Exam  Constitutional: He is oriented to person, place, and time. He appears well-developed and well-nourished.  HENT:  Head: Normocephalic and atraumatic.  Eyes: Conjunctivae are normal.  Neck: Normal range of motion. Neck supple.  Cardiovascular: Normal rate, regular rhythm, normal heart sounds and intact distal pulses.   Pulmonary/Chest: Effort normal and breath sounds normal.  Abdominal: Soft. Bowel sounds are normal. He exhibits no distension and no mass. There is no tenderness.  Musculoskeletal: Normal range of motion.  TTP right buttock. No spinal tenderness, no deformities.  Equal strength LE b/l, NV intact. Neg straight leg raise.  Neurological: He is alert and oriented to person, place, and time.  Skin: Skin is warm and dry.  Psychiatric: He has a normal mood and affect. His behavior is normal.  Nursing  note and vitals reviewed.    ED Treatments / Results  Labs (all labs ordered are listed, but only abnormal results are displayed) Labs Reviewed - No data to display  EKG  EKG Interpretation None       Radiology No results found.  Procedures Procedures (including critical care time)  Medications Ordered in ED Medications  ketorolac (TORADOL) 30 MG/ML injection 60 mg (60 mg Intramuscular Given 03/09/17 0706)  predniSONE (DELTASONE) tablet 60 mg (60 mg Oral Given 03/09/17 0704)     Initial Impression / Assessment and Plan / ED Course  I have reviewed the triage vital signs and the nursing notes.  Pertinent labs & imaging results that were available during my care of the patient were reviewed by me and considered in my medical decision making (see chart for details).     Patient w PMHx chronic back pain w sciatica, presents w acute worsening pain after climbing out of bed.  No neurological deficits and normal neuro exam.  No issues ambulating.  No loss of bowel or bladder control.  No concern for cauda  equina.  No fever, night sweats, weight loss, h/o cancer, IVDU.  RICE protocol. Toradol and dose of prednisone given in ED. Discharge w tapered prednisone, instructed pt to monitor blood glucose while taking Prednisone, encouraged PCP follow up.  Patient discussed with Carlisle Cater, PA-C.  Discussed results, findings, treatment and follow up. Patient advised of return precautions. Patient verbalized understanding and agreed with plan.   Final Clinical Impressions(s) / ED Diagnoses   Final diagnoses:  Chronic bilateral low back pain with right-sided sciatica    New Prescriptions Discharge Medication List as of 03/09/2017  7:12 AM    START taking these medications   Details  predniSONE (DELTASONE) 10 MG tablet Take 6 tablets for 3 days, then 4 tablets for 3 days, then 2 tablets for 3 days, then 1 tablet for 3 days., Print         [place] Nicole Russo, [place] 03/09/17 [place], MD 03/09/17 [phone]

## 2017-03-09 NOTE — ED Triage Notes (Addendum)
C/o R sided lower back pain since getting out of the bed quickly on Monday morning.  Reports history of chronic back pain.  Taking his meds without relief.

## 2017-03-11 ENCOUNTER — Telehealth: Payer: Self-pay | Admitting: Pharmacist

## 2017-03-12 NOTE — Progress Notes (Signed)
Steroid-Induced Hyperglycemia Prevention and Management MYRLE DUES is a 56 y.o. male who meets criteria for [facility] quality improvement program (diabetes patient prescribed short course of steroids).  A/P Current Regimen  Patient prescribed prednisone Take 6 tablets for 3 days, then 4 tablets for 3 days, then 2 tablets for 3 days, then 1 tablet for 3 days, currently on day 2 of therapy.   Prednisone indication: DDD, low back pain  Current DM regimen pioglitazone 45 mg daily  Home BG Monitoring  Patient does have a meter at home and does check BG at home.  CBGs at home 130s-140s  CBGs prior to steroid course 130s-140s, A1C prior to steroid course 7.3  S/Sx of hyper- or hypoglycemia: none, none  Medication Management  Additional treatment for BG control is not indicated at this time.  Physician preference level per protocol: 1  Patient Education  Advised patient to monitor BG while on steroid therapy (at least twice daily prior to first 2 meals of the day).  Patient educated about signs/symptoms and advised to contact clinic if hyper- or hypoglycemic.  Patient did  verbalize understanding of information and regimen by repeating back topics discussed.  Follow-up Patient states he will call if BG elevated. Will also reach out to patient via phone  Flossie Dibble 9:43 AM 03/12/2017

## 2017-03-13 ENCOUNTER — Other Ambulatory Visit: Payer: Self-pay | Admitting: Internal Medicine

## 2017-03-13 DIAGNOSIS — E1140 Type 2 diabetes mellitus with diabetic neuropathy, unspecified: Secondary | ICD-10-CM

## 2017-03-14 NOTE — Progress Notes (Signed)
Unable to reach patient for follow up 

## 2017-05-09 ENCOUNTER — Encounter: Payer: Medicare HMO | Admitting: Internal Medicine

## 2017-05-30 ENCOUNTER — Encounter: Payer: Self-pay | Admitting: Internal Medicine

## 2017-05-30 ENCOUNTER — Ambulatory Visit (INDEPENDENT_AMBULATORY_CARE_PROVIDER_SITE_OTHER): Payer: Medicare HMO | Admitting: Internal Medicine

## 2017-05-30 VITALS — BP 123/70 | HR 68 | Temp 98.4°F | Ht 69.0 in | Wt 179.0 lb

## 2017-05-30 DIAGNOSIS — Z79899 Other long term (current) drug therapy: Secondary | ICD-10-CM | POA: Diagnosis not present

## 2017-05-30 DIAGNOSIS — J309 Allergic rhinitis, unspecified: Secondary | ICD-10-CM

## 2017-05-30 DIAGNOSIS — G894 Chronic pain syndrome: Secondary | ICD-10-CM | POA: Diagnosis not present

## 2017-05-30 DIAGNOSIS — E1140 Type 2 diabetes mellitus with diabetic neuropathy, unspecified: Secondary | ICD-10-CM

## 2017-05-30 DIAGNOSIS — E784 Other hyperlipidemia: Secondary | ICD-10-CM

## 2017-05-30 DIAGNOSIS — Z7984 Long term (current) use of oral hypoglycemic drugs: Secondary | ICD-10-CM | POA: Diagnosis not present

## 2017-05-30 DIAGNOSIS — I700 Atherosclerosis of aorta: Secondary | ICD-10-CM

## 2017-05-30 DIAGNOSIS — I10 Essential (primary) hypertension: Secondary | ICD-10-CM

## 2017-05-30 DIAGNOSIS — E785 Hyperlipidemia, unspecified: Secondary | ICD-10-CM

## 2017-05-30 DIAGNOSIS — E109 Type 1 diabetes mellitus without complications: Secondary | ICD-10-CM | POA: Diagnosis not present

## 2017-05-30 DIAGNOSIS — F17210 Nicotine dependence, cigarettes, uncomplicated: Secondary | ICD-10-CM

## 2017-05-30 DIAGNOSIS — D509 Iron deficiency anemia, unspecified: Secondary | ICD-10-CM

## 2017-05-30 DIAGNOSIS — E1169 Type 2 diabetes mellitus with other specified complication: Secondary | ICD-10-CM

## 2017-05-30 DIAGNOSIS — Z8679 Personal history of other diseases of the circulatory system: Secondary | ICD-10-CM

## 2017-05-30 DIAGNOSIS — E139 Other specified diabetes mellitus without complications: Secondary | ICD-10-CM

## 2017-05-30 DIAGNOSIS — J301 Allergic rhinitis due to pollen: Secondary | ICD-10-CM

## 2017-05-30 DIAGNOSIS — K219 Gastro-esophageal reflux disease without esophagitis: Secondary | ICD-10-CM | POA: Diagnosis not present

## 2017-05-30 LAB — GLUCOSE, CAPILLARY: GLUCOSE-CAPILLARY: 124 mg/dL — AB (ref 65–99)

## 2017-05-30 LAB — POCT GLYCOSYLATED HEMOGLOBIN (HGB A1C): HEMOGLOBIN A1C: 7.4

## 2017-05-30 MED ORDER — LORATADINE 10 MG PO TABS: 10.0000 mg | ORAL_TABLET | Freq: Every day | ORAL | 4 refills | Status: DC

## 2017-05-30 NOTE — Assessment & Plan Note (Addendum)
This problem is chronic and stable. His A1c trend has been 9.2 - 7.7 - 7.3 - 7.4. He is on Actos 45 daily. He is having no side effects to this medication He brought in his meter and he hasn't checked his CBG about 5 times and they were all within goal. His A1c is just slightly elevated but I think the risk of adding a second agent outweighs the benefits and he can continue dietary changes at this time. We did not complete his foot examination as he was having significant mouth pain. He has an eye appointment for his diabetic eye check tomorrow.  PLAN:  Cont current meds Follow-up in 6 months

## 2017-05-30 NOTE — Assessment & Plan Note (Signed)
This problem is chronic and stable. There was an incidental finding on a CT in 2015. We're doing risk factor management with diabetes control. He is prescribed a statin but is not taking it daily and I have encouraged him to start taking it daily. He is asymptomatic in regards to the atherosclerosis.  PLAN : I encouraged him to take his statin daily

## 2017-05-30 NOTE — Assessment & Plan Note (Addendum)
This problem is chronic and stable. He is prescribed lisinopril 20. He brought in his medications today and he had for almost full bottles of lisinopril which have been filled monthly. On questioning, he does not take this daily but just takes it as needed when he feels his blood pressure is high. He states he took it yesterday but has not taken it every day this past week. I think his blood pressure today represents his blood pressure off lisinopril. Additionally, I reviewed his microalbumin levels and he has never had an elevated urinary microalbumin level. I discussed that when necessary use of lisinopril is not helpful and we decided to stop the medication and watch his blood pressure and get yearly microalbumin.  PLAN:  Stop lisinopril BMP  BP Readings from Last 3 Encounters:  05/30/17 123/70  03/09/17 (!) 170/98  12/26/16 119/70

## 2017-05-30 NOTE — Assessment & Plan Note (Signed)
This problem is chronic and stable. He recently went to see his orthopedic doctor and got an injection which provided pain relief for 2-3 months. He continues to have a burning sensation, but not today, in his right lower back that radiates down his right leg. His symptoms match with his prior MRI. He is on gabapentin, baclofen, and Celebrex. He has not had hydrocodone since August and I am not going to reinstitute this medication without any significant change in his symptoms and physical exam.  PLAN:  Cont current meds

## 2017-05-30 NOTE — Assessment & Plan Note (Addendum)
This problem is chronic and stable but has not been worked up. He had 2 HgB in May 2017 12.2 & 11.5. The first was preop and the second was postop.It is microcytic. He has had negative guaiac in 2014, 2015, in 2016. I do not want to see him that this was all due to surgery as the first hemoglobin was preop. Microcytic anemia in a 56 year old African-American is worrisome and I need to rule out malignancy.   PLAN : CBC, ferritin, electrophoresis to rule out thalassemia Colon cancer screening via colonoscopy to which the patient agreed

## 2017-05-30 NOTE — Assessment & Plan Note (Signed)
This problem is chronic and stable. He is on a PPI daily. He has never had an EGD. I had referred him to gastroenterology for consideration of an EGD last year because his pain had sent him to the ED. He never had this appointment. I am referring him for a diagnostic colonoscopy so am also asking GI to assess his need for an EGD due to long-standing symptomatic GERD.  PLAN:  Cont current meds Referral to GI for consideration of EGD

## 2017-05-30 NOTE — Progress Notes (Signed)
   Subjective:    [name], male    DOB: October 03, 1961, 56 y.o.   MRN: [id number]  HPI  Donald Palmer is here for DM and HTN F/U. Please see the A&P for the status of the pt's chronic medical problems.  ROS : per ROS section and in problem oriented charting. All other systems are negative.  PMHx, Soc hx, and / or Fam hx : His mother is now out of the rehabilitation center and at home. He is concerned because she is not walking as much as he thinks she should. Donald Palmer had paperwork regarding his disability which I assisted him in filling out.  Review of Systems  HENT: Positive for rhinorrhea and sneezing.   Eyes: Positive for visual disturbance. Negative for pain, discharge and itching.  Respiratory: Negative for cough and shortness of breath.   Musculoskeletal: Positive for back pain.       Objective:   Physical Exam  Constitutional: He appears well-developed and well-nourished. No distress.  HENT:  Head: Normocephalic and atraumatic.  Right Ear: External ear normal.  Left Ear: External ear normal.  Nose: Nose normal.  Eyes: Conjunctivae and EOM are normal.  Cardiovascular: Normal rate, regular rhythm and normal heart sounds.   No murmur heard. Pulmonary/Chest: Effort normal and breath sounds normal. No respiratory distress.  Musculoskeletal: Normal range of motion. He exhibits no edema.  Skin: He is not diaphoretic.  Psychiatric: He has a normal mood and affect. His behavior is normal. Judgment and thought content normal.          Assessment & Plan:

## 2017-05-30 NOTE — Patient Instructions (Signed)
1. Do not take lisinopril which is a medicine for blood pressure 2. I will monitor your blood pressure in our office  3. Take simvastatin which is a cholesterol medication 4. I hope you tooth feels better 4. You are anemic so I am referring you to the stomach doctors for colon cancer screening

## 2017-05-30 NOTE — Assessment & Plan Note (Signed)
This problem is chronic and uncontrolled. He uses Flonase daily but continues to have symptoms. He was agreeable to add on an allergy pill so I prescribed him loratadine 10 mg to his regimen.   PLAN :  loratadine 10 mg daily Continue Flonase

## 2017-05-30 NOTE — Assessment & Plan Note (Signed)
This problem is chronic and stable. He is prescribed simvastatin 40 mg, moderate intensity statin, for primary prevention. He brought in his medications today and he had for medication bottles almost all full of his simvastatin. He stated he is not taking medicine on a regular basis. The reason is because he feels okay. We discussed that it is indicated as he is diabetic to prevent coronary events. I encouraged him to start taking it daily. His 10 year cardiovascular risk is 30% temporary ACC guidelines, he should be on a high intensity statin. At this time, he is not taking his statin daily so I'm going to leave him on the simvastatin 40 and if he does start taking it daily, we will discuss increasing it to a high intensity.  PLAN encouraged him to take his statin daily:

## 2017-05-31 ENCOUNTER — Encounter: Payer: Self-pay | Admitting: Internal Medicine

## 2017-05-31 LAB — CBC
Hematocrit: 45.1 % (ref 37.5–51.0)
Hemoglobin: 14 g/dL (ref 13.0–17.7)
MCH: 23 pg — ABNORMAL LOW (ref 26.6–33.0)
MCHC: 31 g/dL — ABNORMAL LOW (ref 31.5–35.7)
MCV: 74 fL — AB (ref 79–97)
Platelets: 262 10*3/uL (ref 150–379)
RBC: 6.09 x10E6/uL — ABNORMAL HIGH (ref 4.14–5.80)
RDW: 16.4 % — ABNORMAL HIGH (ref 12.3–15.4)
WBC: 6.2 10*3/uL (ref 3.4–10.8)

## 2017-05-31 LAB — BMP8+ANION GAP
Anion Gap: 13 mmol/L (ref 10.0–18.0)
BUN/Creatinine Ratio: 13 (ref 9–20)
BUN: 13 mg/dL (ref 6–24)
CO2: 24 mmol/L (ref 20–29)
Calcium: 9.6 mg/dL (ref 8.7–10.2)
Chloride: 103 mmol/L (ref 96–106)
Creatinine, Ser: 1.01 mg/dL (ref 0.76–1.27)
GFR, EST AFRICAN AMERICAN: 96 mL/min/{1.73_m2} (ref >59)
GFR, EST NON AFRICAN AMERICAN: 83 mL/min/{1.73_m2} (ref >59)
GLUCOSE: 130 mg/dL — AB (ref 65–99)
POTASSIUM: 4.4 mmol/L (ref 3.5–5.2)
Sodium: 140 mmol/L (ref 134–144)

## 2017-05-31 LAB — FERRITIN: Ferritin: 37 ng/mL (ref 30–400)

## 2017-05-31 LAB — HEMOGLOBINOPATHY EVALUATION
HGB A: 97.8 % (ref 96.4–98.8)
HGB C: 0 %
HGB S: 0 %
HGB VARIANT: 0 %
Hemoglobin A2 Quantitation: 2.2 % (ref 1.8–3.2)
Hemoglobin F Quantitation: 0 % (ref 0.0–2.0)

## 2017-05-31 LAB — LIPID PANEL
CHOL/HDL RATIO: 3.4 ratio (ref 0.0–5.0)
Cholesterol, Total: 165 mg/dL (ref 100–199)
HDL: 49 mg/dL (ref >39)
LDL CALC: 97 mg/dL (ref 0–99)
Triglycerides: 97 mg/dL (ref 0–149)
VLDL Cholesterol Cal: 19 mg/dL (ref 5–40)

## 2017-06-12 ENCOUNTER — Emergency Department (HOSPITAL_COMMUNITY): Payer: Medicare HMO

## 2017-06-12 ENCOUNTER — Emergency Department (HOSPITAL_COMMUNITY)
Admission: EM | Admit: 2017-06-12 | Discharge: 2017-06-12 | Disposition: A | Discharge status: Home / Self Care | Payer: Medicare HMO | Attending: Emergency Medicine | Admitting: Emergency Medicine

## 2017-06-12 DIAGNOSIS — M5441 Lumbago with sciatica, right side: Secondary | ICD-10-CM | POA: Insufficient documentation

## 2017-06-12 DIAGNOSIS — I10 Essential (primary) hypertension: Secondary | ICD-10-CM | POA: Insufficient documentation

## 2017-06-12 DIAGNOSIS — Z7982 Long term (current) use of aspirin: Secondary | ICD-10-CM | POA: Diagnosis not present

## 2017-06-12 DIAGNOSIS — M545 Low back pain: Secondary | ICD-10-CM | POA: Diagnosis not present

## 2017-06-12 DIAGNOSIS — E119 Type 2 diabetes mellitus without complications: Secondary | ICD-10-CM | POA: Diagnosis not present

## 2017-06-12 DIAGNOSIS — G8929 Other chronic pain: Secondary | ICD-10-CM | POA: Diagnosis not present

## 2017-06-12 DIAGNOSIS — F17210 Nicotine dependence, cigarettes, uncomplicated: Secondary | ICD-10-CM | POA: Diagnosis not present

## 2017-06-12 DIAGNOSIS — Z79899 Other long term (current) drug therapy: Secondary | ICD-10-CM | POA: Diagnosis not present

## 2017-06-12 DIAGNOSIS — Z7984 Long term (current) use of oral hypoglycemic drugs: Secondary | ICD-10-CM | POA: Insufficient documentation

## 2017-06-12 DIAGNOSIS — M5431 Sciatica, right side: Secondary | ICD-10-CM

## 2017-06-12 MED ORDER — KETOROLAC TROMETHAMINE 60 MG/2ML IM SOLN
60.0000 mg | Freq: Once | INTRAMUSCULAR | Status: AC
  Administered 2017-06-12: 60 mg via INTRAMUSCULAR
  Filled 2017-06-12: qty 2

## 2017-06-12 MED ORDER — TRAMADOL HCL 50 MG PO TABS: 50.0000 mg | ORAL_TABLET | Freq: Four times a day (QID) | ORAL | 0 refills | Status: DC | PRN

## 2017-06-12 MED ORDER — CYCLOBENZAPRINE HCL 10 MG PO TABS: 10.0000 mg | ORAL_TABLET | Freq: Every day | ORAL | 0 refills | Status: DC

## 2017-06-12 MED ORDER — PREDNISONE 50 MG PO TABS: 50.0000 mg | ORAL_TABLET | Freq: Every day | ORAL | 0 refills | Status: DC

## 2017-06-12 NOTE — ED Triage Notes (Signed)
Pt presents with chronic back pain; pt sts pain has gotten worse for the last 2 months; sts he has disc problems; no pain medication works.  Numbness and tingling down R leg started today.

## 2017-06-12 NOTE — ED Notes (Signed)
Patient transported to X-ray 

## 2017-06-12 NOTE — Discharge Instructions (Signed)
He will need to see your primary doctor for recheck and follow up with possible need for MRI.  Return here as needed.  Use ice and heat on your lower back

## 2017-06-12 NOTE — ED Provider Notes (Signed)
[name] Note   CSN: [id number] Arrival date & time: 06/12/17  3536     History   Chief Complaint Chief Complaint  Patient presents with  . Back Pain    HPI Donald Palmer is a 56 y.o. male.  HPI Patient presents to the emergency department with lower back pain is chronic in nature.  The patient states that this been ongoing for several years, but noticed over the last few days.  He was having more pain in the right lower lumbar region.  He states that he has a burning type pain that goes into his right buttocks.  Patient states that the pain does seem to radiate into the right leg as well.  Patient states that he has only seen his primary doctor for these symptoms.  He states that pain medications do not seem to be helping with this pain.  Patient states that nothing seems make the condition better, but movement and palpation make the pain worse.  Patient denies rash, nausea, vomiting, abdominal pain, numbness, weakness, dizziness, neck pain, incontinence or syncope Past Medical History:  Diagnosis Date  . Back pain, chronic   . Diabetes mellitus 2007  . Hepatitis C    [id number]  . Hypertension goal BP (blood pressure) < 140/80   . Neuromuscular disorder ([place])   . Pancreatitis 01/09/2012    Patient Active Problem List   Diagnosis Date Noted  . Iron deficiency 12/27/2016  . Erectile dysfunction associated with type 2 diabetes mellitus ([place]) 03/01/2016  . Atherosclerosis of aorta ([place]) 03/30/2015  . Chronic pain syndrome 03/18/2015  . Hyperlipidemia associated with type 2 diabetes mellitus ([place]) 12/22/2014  . Allergic rhinitis 10/22/2014  . Preventative health care 12/08/2012  . GERD (gastroesophageal reflux disease) 07/08/2012  . History of chronic hepatitis C 05/13/2012  . Diabetic polyradiculopathy associated with type 2 diabetes mellitus ([place]) 04/30/2012  . History of hypertension 01/30/2012  . Type 2 diabetes mellitus with diabetic neuropathy ([place])  12/17/2001    Past Surgical History:  Procedure Laterality Date  . I&D EXTREMITY Right 05/15/2016   Procedure: RIGHT INDEX FINGER FIRST THROUGH  MIDDLE PHALYNX  AMPUTATION;  Surgeon: [name], MD;  Location: [facility];  Service: Orthopedics;  Laterality: Right;  . OPEN REDUCTION INTERNAL FIXATION (ORIF) DISTAL PHALANX Right 04/02/2016   Procedure: RIGHT INDEX FINGER REPAIR;  Surgeon: [name], MD;  Location: [facility];  Service: Orthopedics;  Laterality: Right;       Home Medications    Prior to Admission medications   Medication Sig Start Date End Date Taking? Authorizing Provider  ACCU-CHEK AVIVA PLUS test strip Check blood sugar one time a day as instructed 02/27/17   [name], MD  baclofen (LIORESAL) 10 MG tablet Take 1 tablet (10 mg total) by mouth every 8 (eight) hours as needed for muscle spasms. 06/07/16   [name], MD  Blood Glucose Monitoring Suppl (ACCU-CHEK AVIVA PLUS) W/DEVICE KIT 1 each by Does not apply route 2 (two) times daily. To check blood sugar twice daily. diag code E11.40. Non insulin dependent. Please check with pt that this is the equipment he needs. 09/08/15   [name], MD  clotrimazole-betamethasone (LOTRISONE) cream Apply 1 application topically 2 (two) times daily. 04/20/15   [name], DPM  fluticasone (FLONASE) 50 MCG/ACT nasal spray Place 2 sprays into both nostrils daily. 12/26/16   [name], MD  gabapentin (NEURONTIN) 300 MG capsule Take 1 capsule (300 mg  total) by mouth 3 (three) times daily. 03/19/16   [name], MD  Lancet Devices MISC 1 each by Does not apply route 2 (two) times daily. Please use to check blood sugar 2 times daily. diag code E11.40. noninsulin dependent 09/01/15   [name], MD  loratadine (CLARITIN) 10 MG tablet Take 1 tablet (10 mg total) by mouth daily. 05/30/17   [name], MD  menthol-zinc oxide (GOLD BOND) powder Apply 1  application topically daily as needed (for pain).    [provider]  omeprazole (PRILOSEC) 20 MG capsule Take 1 capsule (20 mg total) by mouth daily. 03/19/16   [name], MD  pioglitazone (ACTOS) 45 MG tablet Take 1 tablet (45 mg total) by mouth daily. 03/13/17   [name], MD  simvastatin (ZOCOR) 40 MG tablet Take 1 tablet (40 mg total) by mouth every evening. 12/20/16   [name], MD  tadalafil (CIALIS) 10 MG tablet Take 1 tablet (10 mg total) by mouth as needed for erectile dysfunction. 06/07/16 06/07/17  [name], MD    Family History Family History  Problem Relation Age of Onset  . Diabetes Mother   . Hypertension Mother   . Hyperlipidemia Mother   . Diabetes Brother   . Hypertension Brother   . Heart attack Neg Hx   . Sudden death Neg Hx     Social History Social History  Substance Use Topics  . Smoking status: Current Some Day Smoker    Packs/day: 0.10    Types: Cigarettes, Cigars  . Smokeless tobacco: Never Used     Comment: 2 cigs/week  . Alcohol use No     Allergies   Metformin and related   Review of Systems Review of Systems All other systems negative except as documented in the HPI. All pertinent positives and negatives as reviewed in the HPI.  Physical Exam Updated Vital Signs BP (!) 164/93 (BP Location: Right Arm)   Pulse 69   Temp 98.2 F (36.8 C) (Oral)   Resp 16   Ht '5\' 9"'$  (1.753 m)   Wt 81.2 kg (179 lb)   SpO2 100%   BMI 26.43 kg/m   Physical Exam  Constitutional: He is oriented to person, place, and time. He appears well-developed and well-nourished. No distress.  HENT:  Head: Normocephalic and atraumatic.  Eyes: Pupils are equal, round, and reactive to light.  Pulmonary/Chest: Effort normal.  Musculoskeletal:       Lumbar back: He exhibits tenderness and pain. He exhibits normal range of motion, no bony tenderness, no swelling, no deformity and no spasm.  Neurological: He is alert and  oriented to person, place, and time. He has normal strength. He displays normal reflexes. No sensory deficit. He exhibits normal muscle tone. Coordination and gait normal.  Skin: Skin is warm and dry.  Psychiatric: He has a normal mood and affect.  Nursing note and vitals reviewed.    ED Treatments / Results  Labs (all labs ordered are listed, but only abnormal results are displayed) Labs Reviewed - No data to display  EKG  EKG Interpretation None       Radiology Dg Lumbar Spine Complete  Result Date: 06/12/2017 CLINICAL DATA:  Chronic back pain which has increased over the past 2 months. Patient ports numbness and tingling in the right leg began today. EXAM: LUMBAR SPINE - COMPLETE 4+ VIEW COMPARISON:  Lumbar spine series dated February 08, 2015 FINDINGS: The lumbar vertebral bodies are  preserved in height. The disc space heights are well maintained with exception of minimal narrowing at L4-5. There is no spondylolisthesis. There is no significant facet joint hypertrophy. The pedicles and transverse processes are intact where visualized. The observed portions of the sacrum are normal. There is calcification in the wall of the abdominal aorta. IMPRESSION: Mild degenerative disc space narrowing at L4-5. No compression fracture nor other significant bony abnormality. If the patient's symptoms persist and remain unexplained, MRI of the lumbar spine would be a useful next imaging step. Electronically Signed   By: David  Martinique M.D.   On: 06/12/2017 07:20    Procedures Procedures (including critical care time)  Medications Ordered in ED Medications - No data to display   Initial Impression / Assessment and Plan / ED Course  I have reviewed the triage vital signs and the nursing notes.  Pertinent labs & imaging results that were available during my care of the patient were reviewed by me and considered in my medical decision making (see chart for details).   patient does not have any  neurological deficits noted on exam.  Patient will be referred to his primary Dr. for follow-up and possible MRI imaging.  Told to return here as needed.  Patient agrees the plan and all questions were answered    Final Clinical Impressions(s) / ED Diagnoses   Final diagnoses:  None    New Prescriptions New Prescriptions   No medications on file     Rebeca Allegra 06/12/17 Harrel Lemon    Jola Schmidt, MD 06/13/17 Rogene Houston

## 2017-06-14 DIAGNOSIS — H11001 Unspecified pterygium of right eye: Secondary | ICD-10-CM | POA: Diagnosis not present

## 2017-06-14 DIAGNOSIS — H40013 Open angle with borderline findings, low risk, bilateral: Secondary | ICD-10-CM | POA: Diagnosis not present

## 2017-06-14 DIAGNOSIS — H2513 Age-related nuclear cataract, bilateral: Secondary | ICD-10-CM | POA: Diagnosis not present

## 2017-06-14 DIAGNOSIS — E119 Type 2 diabetes mellitus without complications: Secondary | ICD-10-CM | POA: Diagnosis not present

## 2017-06-14 LAB — HM DIABETES EYE EXAM

## 2017-06-25 ENCOUNTER — Ambulatory Visit (INDEPENDENT_AMBULATORY_CARE_PROVIDER_SITE_OTHER): Payer: Medicare HMO | Admitting: Internal Medicine

## 2017-06-25 ENCOUNTER — Other Ambulatory Visit: Payer: Self-pay | Admitting: Dietician

## 2017-06-25 VITALS — BP 130/84 | HR 89 | Temp 98.2°F | Wt 175.3 lb

## 2017-06-25 DIAGNOSIS — M5416 Radiculopathy, lumbar region: Secondary | ICD-10-CM

## 2017-06-25 MED ORDER — KETOROLAC TROMETHAMINE 30 MG/ML IJ SOLN
60.0000 mg | Freq: Once | INTRAMUSCULAR | Status: AC
  Administered 2017-06-25: 60 mg via INTRAMUSCULAR

## 2017-06-25 MED ORDER — CYCLOBENZAPRINE HCL 10 MG PO TABS: 10.0000 mg | ORAL_TABLET | Freq: Every evening | ORAL | 0 refills | Status: DC | PRN

## 2017-06-25 NOTE — Assessment & Plan Note (Signed)
Back Pain Assessment Pt medical history, current sx, and exam indicate lumbar radiculopathy. Prior MR imaging in 2013 shows disc bulging and bilateral foraminal narrowing at L4-L5 consistent with current clinical picture. He has tried various pain medication regimens and physical therapy, and did not follow with a PMR referral. Today, the patient is frustrated with a lack of response to treatment and wishes to pursue other interventional treatment options. We will continue his current pain medication regimen of gabapentin, baclofen, and celebrex, and offer additional medications in short term as below.   Plan: -Neurosurgery referral for evaluation -Cyclobenzaprine 10 mg qhs PRN (#20) -Toradol 60 mg injection in clinic

## 2017-06-25 NOTE — Patient Instructions (Signed)
Thank you for coming in Donald Palmer.   We've provided a supply of Cyclobenzaprine to help with muscle spasms and given you a shot of Toradol to help with your pain today.   We've put in a referral to the spine surgeons for them to evaluate your back issue.

## 2017-06-25 NOTE — Telephone Encounter (Signed)
[facility] called about the Flexeril prescription. When I told them it had been printed they said they'd wait for him to bring it in.

## 2017-06-25 NOTE — Telephone Encounter (Signed)
Due to printer errors-rx did not print and was phoned into the pharmacy.  Pharmacy was unable to process rx with prescribing MD's #.  Rx phoned it pharmacy using attending's [facility]) NPI.Despina Hidden Cassady7/10/20182:23 PM

## 2017-06-25 NOTE — Progress Notes (Signed)
   CC: Back pain  HPI:  DonaldDonald Palmer is a 56 y.o. M with past medical history as detailed below who presents for lower back pain.   Donald Palmer has a history of chronic lower back pain, he states over the past two months his pain has worsened. It is right sided, constant pain with intermittent worsening, especially with movement or sitting for long periods. The pain radiates down the right buttock and leg, as well as the groin. He also notes numbness/tingling down his right leg. Denies saddle anesthesia, urinary or bowel incontinence, or numbness to left leg. He was seen in the ED on 6/27 for this pain, he received a toradol injection  Past Medical History:  Diagnosis Date  . Back pain, chronic   . Diabetes mellitus 2007  . Hepatitis C    [id number]  . Hypertension goal BP (blood pressure) < 140/80   . Neuromuscular disorder ([place])   . Pancreatitis 01/09/2012   Review of Systems:  Review of Systems  Constitutional: Negative for fever.  Musculoskeletal: Positive for back pain. Negative for falls.  Neurological: Positive for tingling. Negative for focal weakness.     Physical Exam:  Vitals:   06/25/17 1011  BP: 130/84  Pulse: 89  Temp: 98.2 F (36.8 C)  TempSrc: Oral  SpO2: 100%  Weight: 175 lb 4.8 oz (79.5 kg)   Physical Exam  Constitutional: He is oriented to person, place, and time. He appears well-developed and well-nourished.  Musculoskeletal:  Tenderness to palpation to R lumbar paraspinal muscles, positive straight leg test on RLE, 5/5 strength bilateral LEs   Neurological: He is alert and oriented to person, place, and time.  Decreased sensation in RLE compared to LLE on gross testing, normal patellar reflexes bilaterally  Skin: Skin is warm and dry.    Assessment & Plan:   See Encounters Tab for problem based charting.  Patient seen with Dr. Angelia Mould

## 2017-06-27 ENCOUNTER — Other Ambulatory Visit: Payer: Self-pay

## 2017-06-28 ENCOUNTER — Ambulatory Visit: Payer: Medicare HMO | Admitting: Internal Medicine

## 2017-06-28 NOTE — Progress Notes (Signed)
Internal Medicine Clinic Attending  I saw and evaluated the patient.  I personally confirmed the key portions of the history and exam documented by Dr. Harden and I reviewed pertinent patient test results.  The assessment, diagnosis, and plan were formulated together and I agree with the documentation in the resident's note.  

## 2017-07-02 ENCOUNTER — Encounter: Payer: Self-pay | Admitting: Internal Medicine

## 2017-07-03 NOTE — Addendum Note (Signed)
Addended by: Hulan Fray on: 07/03/2017 07:32 AM   Modules accepted: Orders

## 2017-07-04 ENCOUNTER — Other Ambulatory Visit: Payer: Self-pay

## 2017-07-04 NOTE — Telephone Encounter (Signed)
traMADol (ULTRAM) 50 MG tablet, refill request @ [facility].

## 2017-07-05 NOTE — Telephone Encounter (Signed)
Tramadol was from ED. Recently seen in [facility] for pain and refill not deemed appropriate / needed.  I have only recently gotten him off PRN opioids. Since we did not Rx the tramadol, will need appt.  Where are we with NS referral?

## 2017-07-08 NOTE — Telephone Encounter (Signed)
Patient is scheduled for 07/18/17 at 8:15 am with Dr. Lynnae January.  Just called patient to inform him of this appointment date and time.  No answer left detailed message on voicemail informing him of upcoming appointemnt and will also mail an appointment card.

## 2017-07-08 NOTE — Telephone Encounter (Signed)
Needs appt per dr Lynnae January

## 2017-07-18 ENCOUNTER — Ambulatory Visit (INDEPENDENT_AMBULATORY_CARE_PROVIDER_SITE_OTHER): Payer: Medicare HMO | Admitting: Internal Medicine

## 2017-07-18 ENCOUNTER — Encounter: Payer: Self-pay | Admitting: Internal Medicine

## 2017-07-18 VITALS — BP 159/85 | HR 80 | Temp 97.9°F | Ht 69.0 in | Wt 181.2 lb

## 2017-07-18 DIAGNOSIS — G894 Chronic pain syndrome: Secondary | ICD-10-CM | POA: Diagnosis not present

## 2017-07-18 DIAGNOSIS — E119 Type 2 diabetes mellitus without complications: Secondary | ICD-10-CM

## 2017-07-18 DIAGNOSIS — G5701 Lesion of sciatic nerve, right lower limb: Secondary | ICD-10-CM | POA: Diagnosis not present

## 2017-07-18 DIAGNOSIS — F17290 Nicotine dependence, other tobacco product, uncomplicated: Secondary | ICD-10-CM

## 2017-07-18 DIAGNOSIS — F17210 Nicotine dependence, cigarettes, uncomplicated: Secondary | ICD-10-CM

## 2017-07-18 DIAGNOSIS — K219 Gastro-esophageal reflux disease without esophagitis: Secondary | ICD-10-CM

## 2017-07-18 DIAGNOSIS — Z79899 Other long term (current) drug therapy: Secondary | ICD-10-CM | POA: Diagnosis not present

## 2017-07-18 LAB — GLUCOSE, CAPILLARY: GLUCOSE-CAPILLARY: 245 mg/dL — AB (ref 65–99)

## 2017-07-18 MED ORDER — CELECOXIB 200 MG PO CAPS: 200.0000 mg | ORAL_CAPSULE | Freq: Every day | ORAL | 5 refills | Status: DC

## 2017-07-18 MED ORDER — CYCLOBENZAPRINE HCL 10 MG PO TABS: 10.0000 mg | ORAL_TABLET | Freq: Every evening | ORAL | 0 refills | Status: DC | PRN

## 2017-07-18 MED ORDER — TRAMADOL HCL 50 MG PO TABS: 50.0000 mg | ORAL_TABLET | Freq: Two times a day (BID) | ORAL | 0 refills | Status: DC | PRN

## 2017-07-18 MED ORDER — OMEPRAZOLE 20 MG PO CPDR: DELAYED_RELEASE_CAPSULE | ORAL | 11 refills | Status: DC

## 2017-07-18 NOTE — Assessment & Plan Note (Signed)
Problem chronic and worse. He has a history of chronic back pain and it worse about 2 months ago. There was no trauma or trigger for the worsening. He had intermittent right-sided radicular pain but that seems to have resolved in the past couple weeks. He continues to have numbness of the right extremity in the radicular pattern. He still has lower back pain that is going from the right side to the left side. This is interfering with his sleep and is worse than baseline pain. He denies any weakness, falls, bowel or bladder dysfunction. He is using gabapentin 200 3 times a day, cyclobenzaprine 10 daily at bedtime, and tramadol. He got the tramadol on June 27 from the emergency department and got 15 pills. He has one pill left. It averages out to about 3 today. He states that he takes 1 in the morning and 2 before bed and it is very effective. He is having no side effects to these medications. He has not seen Dr. Tessa Lerner in about a year and a half there was also treating his chronic pain. He is no longer taking Celebrex although it appears to have fallen off his medication list rather than being discontinued by a physician. He had an MRI in 2013 that showed mild disc bulging at L4-L5 with bilateral mild foraminal encroachment.  ASSESSMENT : Acute on chronic low back pain with intermittent right radicular pain. This to be consistent with his 2013 MRI findings which showed encroachment of the right L4-L5 foramen. He was referred to neurosurgery at his last appointment but that has not occurred. He would like refills on the cyclobenzaprine and tramadol today. We discussed that this is okay for short-term management but that he would not be on the tramadol chronically. This is not because he has had red flag behavior with opioids in the past but rather the desire not to use controlled substances for a chronic medical issue. We discussed we will use this medication while he worked up his pain and found other treatment  options.  PLAN L MRI lumbar spine F/U NS referral Restart Celebrex Refill cyclobenzaprine QHS PRN Continue gabapentin Tramadol BID PRN F/U 2 weeks

## 2017-07-18 NOTE — Assessment & Plan Note (Signed)
This problem is chronic and stable. He is on omeprazole but takes it only as needed which is typically after he has eaten fried foods. On the 30 of June, he took 2 and then vomited. He felt this was due to the fact that the pills had been filled 6 months ago, although they do not expire until 2019. I explained that with likely a coincidence but that I would refill the medication since it was due.  PLAN : Refill omeprazole

## 2017-07-18 NOTE — Patient Instructions (Signed)
1. Take celebrex daily 2. Take flexeril at bedtime as needed 3. Take Tramadol only when absolutely needed 4. Get MRI 5. Return in 2 weeks

## 2017-07-18 NOTE — Progress Notes (Signed)
   Subjective:    [name], male    DOB: November 06, 1961, 56 y.o.   MRN: [id number]  HPI  Donald Palmer is here for LBP. Please see the A&P for the status of the pt's chronic medical problems.  ROS : per ROS section and in problem oriented charting. All other systems are negative.  PMHx, Soc hx, and / or Fam hx : Not working. 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  Review of Systems  Gastrointestinal: Positive for abdominal pain and vomiting.  Musculoskeletal: Positive for back pain and gait problem.  Psychiatric/Behavioral: Positive for sleep disturbance.       Objective:   Physical Exam  Constitutional: He appears well-developed and well-nourished. No distress.  HENT:  Head: Normocephalic and atraumatic.  Right Ear: External ear normal.  Left Ear: External ear normal.  Nose: Nose normal.  Eyes: Conjunctivae and EOM are normal.  Musculoskeletal: He exhibits no edema, tenderness or deformity.  Neg R SLR Strength 5/5 all muscles grps R Able to stand I without use of hands Gait nl Sl tenderness to palp R Lumbar paraspinal No sig muscle spasm  Skin: Skin is warm and dry. He is not diaphoretic.  + multiple tattoos  Psychiatric: He has a normal mood and affect. His behavior is normal. Judgment and thought content normal.          Assessment & Plan:

## 2017-07-25 DIAGNOSIS — M5416 Radiculopathy, lumbar region: Secondary | ICD-10-CM | POA: Diagnosis not present

## 2017-07-31 ENCOUNTER — Ambulatory Visit (HOSPITAL_COMMUNITY): Admission: RE | Admit: 2017-07-31 | Payer: Medicare HMO | Source: Ambulatory Visit

## 2017-08-08 ENCOUNTER — Ambulatory Visit (HOSPITAL_COMMUNITY): Admission: RE | Admit: 2017-08-08 | Payer: Medicare HMO | Source: Ambulatory Visit

## 2017-08-15 ENCOUNTER — Ambulatory Visit (HOSPITAL_COMMUNITY): Admission: RE | Admit: 2017-08-15 | Payer: Medicare HMO | Source: Ambulatory Visit

## 2017-08-22 ENCOUNTER — Ambulatory Visit (INDEPENDENT_AMBULATORY_CARE_PROVIDER_SITE_OTHER): Payer: Medicare HMO | Admitting: Internal Medicine

## 2017-08-22 DIAGNOSIS — Z23 Encounter for immunization: Secondary | ICD-10-CM | POA: Diagnosis not present

## 2017-08-22 DIAGNOSIS — E1165 Type 2 diabetes mellitus with hyperglycemia: Secondary | ICD-10-CM

## 2017-08-22 DIAGNOSIS — Z833 Family history of diabetes mellitus: Secondary | ICD-10-CM

## 2017-08-22 DIAGNOSIS — F17210 Nicotine dependence, cigarettes, uncomplicated: Secondary | ICD-10-CM

## 2017-08-22 DIAGNOSIS — G894 Chronic pain syndrome: Secondary | ICD-10-CM | POA: Diagnosis not present

## 2017-08-22 DIAGNOSIS — E1140 Type 2 diabetes mellitus with diabetic neuropathy, unspecified: Secondary | ICD-10-CM

## 2017-08-22 DIAGNOSIS — F17290 Nicotine dependence, other tobacco product, uncomplicated: Secondary | ICD-10-CM

## 2017-08-22 DIAGNOSIS — K219 Gastro-esophageal reflux disease without esophagitis: Secondary | ICD-10-CM

## 2017-08-22 DIAGNOSIS — Z8349 Family history of other endocrine, nutritional and metabolic diseases: Secondary | ICD-10-CM

## 2017-08-22 DIAGNOSIS — Z79899 Other long term (current) drug therapy: Secondary | ICD-10-CM | POA: Diagnosis not present

## 2017-08-22 DIAGNOSIS — Z8249 Family history of ischemic heart disease and other diseases of the circulatory system: Secondary | ICD-10-CM

## 2017-08-22 MED ORDER — GLUCOSE BLOOD VI STRP: ORAL_STRIP | 5 refills | Status: DC

## 2017-08-22 NOTE — Assessment & Plan Note (Signed)
This problem is chronic and improved. Taking Gaba 200 TID, Celebrex, and flexeril QHS. Cut back on tramadol to one per week bc "making sugar high" and pain decreased. Thinks the trigger for the flare was the woman he was seeing who was stressful. Did see neurosurg but I have not yet seen their notes. Did not yet the MRI.  Still with RLE numbness. Hard to pin down details but seems to have constant R foot numbness (diabetic neuropathy) and sometime posterior numbness from lower back to foot (radicular). The LBP is better and now just soreness on R side.  Since overall better, no red flags, minimal opiate use, no MRI needed.  PLAN : cont Gaba, celebrex, flexeril. OK for tramadol once weekly.

## 2017-08-22 NOTE — Assessment & Plan Note (Signed)
This problem is chronic and stable. He likes the new omeprazole, which is a blue capsule, bc effective and is taking it QD. No SE to med.  PLAN:  Cont current meds

## 2017-08-22 NOTE — Patient Instructions (Signed)
1. See me in December to recheck your diabetes

## 2017-08-22 NOTE — Progress Notes (Signed)
   Subjective:    [name], male    DOB: [birth year], 56 y.o.   MRN: [id number]  HPI  Donald Palmer is here for LBP F/U. Please see the A&P for the status of the pt's chronic medical problems.  ROS : per ROS section and in problem oriented charting. All other systems are negative.  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 Swimming one day per week   Review of Systems  Constitutional: Positive for activity change.  Endocrine:       Hyperglycemia, asymptomatic  Musculoskeletal: Positive for back pain. Negative for gait problem.  Neurological: Positive for numbness.  Psychiatric/Behavioral:       Decreased stress      Objective:   Physical Exam  Constitutional: He appears well-developed and well-nourished. No distress.  HENT:  Head: Normocephalic and atraumatic.  Right Ear: External ear normal.  Left Ear: External ear normal.  Nose: Nose normal.  Eyes: Conjunctivae and EOM are normal.  Musculoskeletal: Normal range of motion. He exhibits no edema or deformity.  Neurological: He is alert.  Gait nl  Skin: Skin is warm and dry. No rash noted. He is not diaphoretic. No erythema.  + Tattoos  Psychiatric: He has a normal mood and affect. His behavior is normal. Judgment and thought content normal.      Assessment & Plan:

## 2017-08-27 ENCOUNTER — Encounter: Payer: Self-pay | Admitting: *Deleted

## 2017-09-09 ENCOUNTER — Other Ambulatory Visit: Payer: Self-pay

## 2017-09-09 ENCOUNTER — Emergency Department (HOSPITAL_COMMUNITY)
Admission: EM | Admit: 2017-09-09 | Discharge: 2017-09-09 | Disposition: A | Discharge status: Home / Self Care | Payer: Medicare HMO | Attending: Emergency Medicine | Admitting: Emergency Medicine

## 2017-09-09 ENCOUNTER — Encounter (HOSPITAL_COMMUNITY): Payer: Self-pay | Admitting: Emergency Medicine

## 2017-09-09 ENCOUNTER — Emergency Department (HOSPITAL_COMMUNITY): Payer: Medicare HMO

## 2017-09-09 DIAGNOSIS — R739 Hyperglycemia, unspecified: Secondary | ICD-10-CM | POA: Insufficient documentation

## 2017-09-09 DIAGNOSIS — F17210 Nicotine dependence, cigarettes, uncomplicated: Secondary | ICD-10-CM | POA: Insufficient documentation

## 2017-09-09 DIAGNOSIS — Z79899 Other long term (current) drug therapy: Secondary | ICD-10-CM | POA: Diagnosis not present

## 2017-09-09 DIAGNOSIS — E860 Dehydration: Secondary | ICD-10-CM | POA: Diagnosis not present

## 2017-09-09 DIAGNOSIS — R51 Headache: Secondary | ICD-10-CM | POA: Insufficient documentation

## 2017-09-09 DIAGNOSIS — E1165 Type 2 diabetes mellitus with hyperglycemia: Secondary | ICD-10-CM | POA: Diagnosis not present

## 2017-09-09 LAB — BASIC METABOLIC PANEL
ANION GAP: 9 (ref 5–15)
BUN: 30 mg/dL — ABNORMAL HIGH (ref 6–20)
CALCIUM: 9.1 mg/dL (ref 8.9–10.3)
CO2: 22 mmol/L (ref 22–32)
Chloride: 99 mmol/L — ABNORMAL LOW (ref 101–111)
Creatinine, Ser: 2.75 mg/dL — ABNORMAL HIGH (ref 0.61–1.24)
GFR, EST AFRICAN AMERICAN: 28 mL/min — AB (ref >60)
GFR, EST NON AFRICAN AMERICAN: 24 mL/min — AB (ref >60)
GLUCOSE: 314 mg/dL — AB (ref 65–99)
Potassium: 4.7 mmol/L (ref 3.5–5.1)
Sodium: 130 mmol/L — ABNORMAL LOW (ref 135–145)

## 2017-09-09 LAB — CBC
HEMATOCRIT: 41.5 % (ref 39.0–52.0)
HEMOGLOBIN: 13.7 g/dL (ref 13.0–17.0)
MCH: 23.6 pg — ABNORMAL LOW (ref 26.0–34.0)
MCHC: 33 g/dL (ref 30.0–36.0)
MCV: 71.6 fL — ABNORMAL LOW (ref 78.0–100.0)
Platelets: 239 10*3/uL (ref 150–400)
RBC: 5.8 MIL/uL (ref 4.22–5.81)
RDW: 14.4 % (ref 11.5–15.5)
WBC: 8.7 10*3/uL (ref 4.0–10.5)

## 2017-09-09 LAB — CBG MONITORING, ED
GLUCOSE-CAPILLARY: 308 mg/dL — AB (ref 65–99)
GLUCOSE-CAPILLARY: 108 mg/dL — AB (ref 65–99)

## 2017-09-09 LAB — I-STAT CHEM 8, ED
BUN: 26 mg/dL — ABNORMAL HIGH (ref 6–20)
BUN: 23 mg/dL — ABNORMAL HIGH (ref 6–20)
CALCIUM ION: 1.11 mmol/L — AB (ref 1.15–1.40)
CREATININE: 1.6 mg/dL — AB (ref 0.61–1.24)
Calcium, Ion: 1.15 mmol/L (ref 1.15–1.40)
Chloride: 105 mmol/L (ref 101–111)
Chloride: 105 mmol/L (ref 101–111)
Creatinine, Ser: 1.3 mg/dL — ABNORMAL HIGH (ref 0.61–1.24)
GLUCOSE: 136 mg/dL — AB (ref 65–99)
Glucose, Bld: 122 mg/dL — ABNORMAL HIGH (ref 65–99)
HCT: 42 % (ref 39.0–52.0)
HEMATOCRIT: 42 % (ref 39.0–52.0)
HEMOGLOBIN: 14.3 g/dL (ref 13.0–17.0)
HEMOGLOBIN: 14.3 g/dL (ref 13.0–17.0)
POTASSIUM: 3.9 mmol/L (ref 3.5–5.1)
Potassium: 4.2 mmol/L (ref 3.5–5.1)
SODIUM: 140 mmol/L (ref 135–145)
Sodium: 138 mmol/L (ref 135–145)
TCO2: 23 mmol/L (ref 22–32)
TCO2: 24 mmol/L (ref 22–32)

## 2017-09-09 LAB — URINALYSIS, ROUTINE W REFLEX MICROSCOPIC
BILIRUBIN URINE: NEGATIVE
Glucose, UA: 50 mg/dL — AB
Hgb urine dipstick: NEGATIVE
Ketones, ur: NEGATIVE mg/dL
LEUKOCYTES UA: NEGATIVE
NITRITE: NEGATIVE
PH: 5 (ref 5.0–8.0)
Protein, ur: NEGATIVE mg/dL
SPECIFIC GRAVITY, URINE: 1.016 (ref 1.005–1.030)

## 2017-09-09 MED ORDER — ACETAMINOPHEN 325 MG PO TABS
650.0000 mg | ORAL_TABLET | Freq: Once | ORAL | Status: AC
  Administered 2017-09-09: 650 mg via ORAL
  Filled 2017-09-09: qty 2

## 2017-09-09 MED ORDER — ACETAMINOPHEN 500 MG PO TABS: 1000.0000 mg | ORAL_TABLET | Freq: Once | ORAL | Status: DC

## 2017-09-09 MED ORDER — INSULIN ASPART 100 UNIT/ML ~~LOC~~ SOLN
4.0000 [IU] | Freq: Once | SUBCUTANEOUS | Status: AC
  Administered 2017-09-09: 4 [IU] via INTRAVENOUS
  Filled 2017-09-09: qty 1

## 2017-09-09 MED ORDER — SODIUM CHLORIDE 0.9 % IV BOLUS (SEPSIS)
1000.0000 mL | Freq: Once | INTRAVENOUS | Status: AC
  Administered 2017-09-09: 1000 mL via INTRAVENOUS

## 2017-09-09 NOTE — ED Provider Notes (Signed)
[facility] DEPT [facility] Provider Note   CSN: [id number] Arrival date & time: 09/09/17  0221     History   Chief Complaint Chief Complaint  Patient presents with  . Hyperglycemia    HPI Donald Palmer is a 56 y.o. male with PMH/o DM who presents with generalized weakness and hyperglycemia. Patient reports that since Sunday morning he has felt generalized weakness. He denies any focal weakness. He states that he was attempting to work on a car yesterday and could not complete it because he just felt "weak and tired all over." He states he can ambulate but feels like "he is going to pass out." He denies any room spinning sensation. He stated he went into the house to rest but still felt weak all over. Patient reports that he gets like this when his sugar is high. Patient is on oral antihyperglycemic medication and states that he has been compliant. Patient reports generalized numbness but states that it is chronic. Patient denies any fevers, CP, SOB, abdominal pain, nausea/vomiting, dysuria, hematuria, vision changes, numbness/weakness of his extrmeities..   The history is provided by the patient.    Past Medical History:  Diagnosis Date  . Back pain, chronic   . Diabetes mellitus 2007  . Hepatitis C    [id number]  . Hypertension goal BP (blood pressure) < 140/80   . Microcytic anemia   . Neuromuscular disorder ([place])   . Pancreatitis 01/09/2012    Patient Active Problem List   Diagnosis Date Noted  . Iron deficiency 12/27/2016  . Erectile dysfunction associated with type 2 diabetes mellitus ([place]) 03/01/2016  . Atherosclerosis of aorta ([place]) 03/30/2015  . Chronic pain syndrome 03/18/2015  . Hyperlipidemia associated with type 2 diabetes mellitus ([place]) 12/22/2014  . Allergic rhinitis 10/22/2014  . Preventative health care 12/08/2012  . Lumbar radiculitis 09/16/2012  . GERD (gastroesophageal reflux disease) 07/08/2012  . History of chronic hepatitis C 05/13/2012  . Diabetic  polyradiculopathy associated with type 2 diabetes mellitus ([place]) 04/30/2012  . History of hypertension 01/30/2012  . Type 2 diabetes mellitus with diabetic neuropathy ([place]) 12/17/2001    Past Surgical History:  Procedure Laterality Date  . I&D EXTREMITY Right 05/15/2016   Procedure: RIGHT INDEX FINGER FIRST THROUGH  MIDDLE PHALYNX  AMPUTATION;  Surgeon: [name], MD;  Location: [facility];  Service: Orthopedics;  Laterality: Right;  . OPEN REDUCTION INTERNAL FIXATION (ORIF) DISTAL PHALANX Right 04/02/2016   Procedure: RIGHT INDEX FINGER REPAIR;  Surgeon: [name], MD;  Location: [facility];  Service: Orthopedics;  Laterality: Right;       Home Medications    Prior to Admission medications   Medication Sig Start Date End Date Taking? Authorizing Provider  Blood Glucose Monitoring Suppl (ACCU-CHEK AVIVA PLUS) W/DEVICE KIT 1 each by Does not apply route 2 (two) times daily. To check blood sugar twice daily. diag code E11.40. Non insulin dependent. Please check with pt that this is the equipment he needs. 09/08/15  Yes [name], MD  celecoxib (CELEBREX) 200 MG capsule Take 1 capsule (200 mg total) by mouth daily. 07/18/17 07/18/18 Yes [name], MD  cyclobenzaprine (FLEXERIL) 10 MG tablet Take 1 tablet (10 mg total) by mouth at bedtime as needed for muscle spasms. 07/18/17  Yes [name], MD  fluticasone (FLONASE) 50 MCG/ACT nasal spray Place 2 sprays into both nostrils daily. 12/26/16  Yes [name], MD  glucose blood (ACCU-CHEK AVIVA PLUS) test strip E11.40. Use to check  glucose once a day 08/22/17  Yes [name], MD  Lancet Devices MISC 1 each by Does not apply route 2 (two) times daily. Please use to check blood sugar 2 times daily. diag code E11.40. noninsulin dependent 09/01/15  Yes [name], MD  lisinopril (PRINIVIL,ZESTRIL) 20 MG tablet Take 20 mg by mouth daily.   Yes [provider]  omeprazole  (PRILOSEC) 20 MG capsule Take 1 capsule (20 mg total) by mouth daily. 07/18/17  Yes [name], MD  simvastatin (ZOCOR) 40 MG tablet Take 1 tablet (40 mg total) by mouth every evening. 12/20/16  Yes [name], MD  traMADol (ULTRAM) 50 MG tablet Take 1 tablet (50 mg total) by mouth every 12 (twelve) hours as needed for severe pain. 07/18/17  Yes [name], MD  baclofen (LIORESAL) 10 MG tablet Take 1 tablet (10 mg total) by mouth every 8 (eight) hours as needed for muscle spasms. Patient not taking: Reported on 09/09/2017 06/07/16   [name], MD  clotrimazole-betamethasone (LOTRISONE) cream Apply 1 application topically 2 (two) times daily. Patient not taking: Reported on 09/09/2017 04/20/15   [name], DPM  gabapentin (NEURONTIN) 300 MG capsule Take 1 capsule (300 mg total) by mouth 3 (three) times daily. Patient not taking: Reported on 09/09/2017 03/19/16   [name], MD  loratadine (CLARITIN) 10 MG tablet Take 1 tablet (10 mg total) by mouth daily. Patient not taking: Reported on 09/09/2017 05/30/17   [name], MD  pioglitazone (ACTOS) 45 MG tablet Take 1 tablet (45 mg total) by mouth daily. Patient not taking: Reported on 09/09/2017 03/13/17   [name], MD  tadalafil (CIALIS) 10 MG tablet Take 1 tablet (10 mg total) by mouth as needed for erectile dysfunction. 06/07/16 06/07/17  [name], MD    Family History Family History  Problem Relation Age of Onset  . Diabetes Mother   . Hypertension Mother   . Hyperlipidemia Mother   . Diabetes Brother   . Hypertension Brother   . Heart attack Neg Hx   . Sudden death Neg Hx     Social History Social History  Substance Use Topics  . Smoking status: Current Some Day Smoker    Packs/day: 0.10    Types: Cigarettes, Cigars  . Smokeless tobacco: Never Used     Comment: 3cigs/day.  . Alcohol use 3.0 oz/week    5 Cans of beer per week     Comment: Beer.      Allergies   Metformin and related   Review of Systems Review of Systems  Constitutional: Negative for chills and fever.  HENT: Negative for congestion.   Respiratory: Negative for shortness of breath.   Cardiovascular: Negative for chest pain.  Gastrointestinal: Negative for abdominal pain, nausea and vomiting.  Genitourinary: Negative for dysuria and hematuria.  Musculoskeletal: Negative for back pain and neck pain.  Neurological: Positive for weakness (generalized). Negative for dizziness, numbness and headaches.     Physical Exam Updated Vital Signs BP (!) 90/55   Pulse 80   Temp 99.9 F (37.7 C) (Oral)   Resp 18   SpO2 100%   Physical Exam  Constitutional: He is oriented to person, place, and time. He appears well-developed and well-nourished.  Sitting comfortably on examination table  HENT:  Head: Normocephalic and atraumatic.  Mouth/Throat: Oropharynx is clear and moist. Mucous membranes are dry.  Eyes: Pupils are equal, round, and reactive to light. Conjunctivae, EOM  and lids are normal. Right eye exhibits no nystagmus. Left eye exhibits no nystagmus.  Neck: Full passive range of motion without pain.  Cardiovascular: Normal rate, regular rhythm, normal heart sounds and normal pulses.  Exam reveals no gallop and no friction rub.   No murmur heard. Pulmonary/Chest: Effort normal and breath sounds normal.  Abdominal: Soft. Normal appearance. There is no tenderness. There is no rigidity and no guarding.  Musculoskeletal: Normal range of motion.  Neurological: He is alert and oriented to person, place, and time. GCS eye subscore is 4. GCS verbal subscore is 5. GCS motor subscore is 6.  Cranial nerves III-XII intact Follows commands, Moves all extremities  5/5 strength to BUE and BLE  Sensation intact throughout all major nerve distributions Normal finger to nose. No dysdiadochokinesia. No pronator drift. No gait abnormalities  No slurred speech. No facial  droop.  Negative Rhomberg.  Skin: Skin is warm and dry. Capillary refill takes less than 2 seconds.  Psychiatric: He has a normal mood and affect. His speech is normal.  Nursing note and vitals reviewed.    ED Treatments / Results  Labs (all labs ordered are listed, but only abnormal results are displayed) Labs Reviewed  BASIC METABOLIC PANEL - Abnormal; Notable for the following:       Result Value   Sodium 130 (*)    Chloride 99 (*)    Glucose, Bld 314 (*)    BUN 30 (*)    Creatinine, Ser 2.75 (*)    GFR calc non Af Amer 24 (*)    GFR calc Af Amer 28 (*)    All other components within normal limits  CBC - Abnormal; Notable for the following:    MCV 71.6 (*)    MCH 23.6 (*)    All other components within normal limits  URINALYSIS, ROUTINE W REFLEX MICROSCOPIC - Abnormal; Notable for the following:    APPearance HAZY (*)    Glucose, UA 50 (*)    All other components within normal limits  CBG MONITORING, ED - Abnormal; Notable for the following:    Glucose-Capillary 308 (*)    All other components within normal limits  CBG MONITORING, ED - Abnormal; Notable for the following:    Glucose-Capillary 108 (*)    All other components within normal limits  I-STAT CHEM 8, ED - Abnormal; Notable for the following:    BUN 26 (*)    Creatinine, Ser 1.60 (*)    Glucose, Bld 122 (*)    All other components within normal limits  I-STAT CHEM 8, ED - Abnormal; Notable for the following:    BUN 23 (*)    Creatinine, Ser 1.30 (*)    Glucose, Bld 136 (*)    Calcium, Ion 1.11 (*)    All other components within normal limits    EKG  EKG Interpretation  Date/Time:  Monday September 09 2017 12:32:42 EDT Ventricular Rate:  72 PR Interval:    QRS Duration: 73 QT Interval:  359 QTC Calculation: 393 R Axis:   30 Text Interpretation:  Sinus rhythm Normal ECG Confirmed by Orpah Greek [phone]) on 09/10/2017 7:48:56 AM       Radiology No results  found.  Procedures Procedures (including critical care time)  Medications Ordered in ED Medications  sodium chloride 0.9 % bolus 1,000 mL (0 mLs Intravenous Stopped 09/09/17 0859)  insulin aspart (novoLOG) injection 4 Units (4 Units Intravenous Given 09/09/17 0750)  sodium chloride 0.9 % bolus 1,000  mL (0 mLs Intravenous Stopped 09/09/17 1057)  acetaminophen (TYLENOL) tablet 650 mg (650 mg Oral Given 09/09/17 1204)     Initial Impression / Assessment and Plan / ED Course  I have reviewed the triage vital signs and the nursing notes.  Pertinent labs & imaging results that were available during my care of the patient were reviewed by me and considered in my medical decision making (see chart for details).     56 year old man past medical history diabetes presents with generalized weakness, lightheadedness and hyperglycemia. No history of fevers, nausea/vomiting, abdominal pain. Patient is afebrile, non-toxic appearing, sitting comfortably on examination table. Vital signs reviewed and stable. No neuro deficits noted on exam. Consider hyperglycemia vs dehydration vs acute infectious etiology. History/physical exam are not concerning for DKA. History/physical exam are not concerning for vertigo. Do not suspect CVA at this time given history/physical exam. Initial labs including CBC, BMP, UA ordered at triage. Will add additional EKG and CT head. IVF given for fluid resuscitation.   Labs reviewed. Initial BG is 314. BMP shows elevated BUN and Creatinine at 30 and 2.75. Records reviewed show that this is an increase from blood work obtained one month ago. Could likely be the cause of his symptoms. CBC unremarkable. UA pending. Will plan to give 4 units of subq inuslin and IVF for fluid resuscitation.   Repeat GBG after insulin shows repeat blood glucose is 108. Repeat BMP shows that BUN and Cr have improved at 26 and 1.60. Will give additional L of fluids.  EKG reviewed. Sinus, Rate 72. CT head  shows chronic vessel changes but no acute abnormalities.   Re-evaluation . Patient feels much better after 2L of fluid. Repeat neuro exam is normal. No evidence of deficits. I personally ambulated patient in the ED. He was able to ambulate without any difficulty. He reports he feels better and does not feel weak anymore. Will repeat BMP.   Repeat BMP shows creatinine is 1.30. Patient is hemodynamically stable for discharge at this time. Instructed patient to follow-up with PCP in 2 days. Strict return precautions discussed. Patient expresses understanding and agreement to plan.    Final Clinical Impressions(s) / ED Diagnoses   Final diagnoses:  Hyperglycemia  Dehydration    New Prescriptions Discharge Medication List as of 09/09/2017 12:59 PM       Volanda Napoleon, PA-C 09/11/17 1226    Ripley Fraise, MD 09/11/17 2309

## 2017-09-09 NOTE — ED Notes (Signed)
Pt unable to void at this time, 

## 2017-09-09 NOTE — Discharge Instructions (Signed)
Take your diabetes medication as directed.   Make sure you're drinking plenty of fluids and staying hydrated.  Follow-up with her primary care doctor in the next 24-48 hours for further evaluation.  Return the emergency Department for any worsening lightheadedness, chest pain, difficulty breathing, pain with AP, blood in urine, numbness/weakness of her arms or legs or any other worsening or concerning symptoms.

## 2017-09-09 NOTE — ED Triage Notes (Signed)
Patient is from home, having woozy head feeling which he states that happens when his sugar is high.  CBG by EMS was 414.  Patient is CAOx4.

## 2017-09-09 NOTE — ED Notes (Signed)
Pt presents today for high blood sugar.

## 2017-09-09 NOTE — ED Notes (Signed)
Checked pt CBG 308 RN Woody informed

## 2017-10-03 ENCOUNTER — Ambulatory Visit (INDEPENDENT_AMBULATORY_CARE_PROVIDER_SITE_OTHER): Payer: Medicare HMO | Admitting: Internal Medicine

## 2017-10-03 VITALS — BP 156/81 | HR 84 | Temp 97.7°F | Ht 69.0 in | Wt 187.7 lb

## 2017-10-03 DIAGNOSIS — Z89021 Acquired absence of right finger(s): Secondary | ICD-10-CM

## 2017-10-03 DIAGNOSIS — Z79899 Other long term (current) drug therapy: Secondary | ICD-10-CM | POA: Diagnosis not present

## 2017-10-03 DIAGNOSIS — E1140 Type 2 diabetes mellitus with diabetic neuropathy, unspecified: Secondary | ICD-10-CM

## 2017-10-03 DIAGNOSIS — Z7984 Long term (current) use of oral hypoglycemic drugs: Secondary | ICD-10-CM | POA: Diagnosis not present

## 2017-10-03 LAB — POCT GLYCOSYLATED HEMOGLOBIN (HGB A1C): Hemoglobin A1C: 8.3

## 2017-10-03 LAB — GLUCOSE, CAPILLARY: Glucose-Capillary: 219 mg/dL — ABNORMAL HIGH (ref 65–99)

## 2017-10-03 MED ORDER — GLIPIZIDE ER 2.5 MG PO TB24: 2.5000 mg | ORAL_TABLET | Freq: Every day | ORAL | 0 refills | Status: DC

## 2017-10-03 NOTE — Progress Notes (Signed)
CC: Diabetes  HPI:  [name] is a 56 y.o. ,ale with PMH as listed below who presents for follow up management of his diabetes. Please see problem based charting for status of patient's chronic medical issues.  T2DM: Last A1c was 7.4 on 05/30/17. Patient is currently prescribed Pioglitazone 45 mg daily. He went to the ED on 09/09/17 feeling weak and tired and found to have hyperglycemia with CBG of 314 and AKI with Creatinine 2.75 (normal at baseline). He was treated with 2 Liters NS and given 4 units Novolog. Repeat I-stat bmet showed improved creatinine to 1.3. POC CBG was 108. He says he was drinking a lot of [employer] which he has since discontinued. He has been very concerned about his diabetic control since his ED visit.   He did not bring his meter today but says he is checking his CBGs daily. He is seeing numbers from 180s - 290s. He has been having increased numbness/tingling in his hands and feet. He denies any increased urinary frequency, polydipsia, or loss of consciousness. He is trying to avoid sugary beverages but does occasionally drink sweet tea. He otherwise only drinks water or diet coke. He says he has been taking his Pioglitazone 45 mg tablets 6 times a day for the last 2 weeks due to his concern of his home glucose readings.    Past Medical History:  Diagnosis Date  . Back pain, chronic   . Diabetes mellitus 2007  . Hepatitis C    [id number]  . Hypertension goal BP (blood pressure) < 140/80   . Microcytic anemia   . Neuromuscular disorder ([place])   . Pancreatitis 01/09/2012   Review of Systems:   Review of Systems  Gastrointestinal: Negative for abdominal pain, nausea and vomiting.  Genitourinary: Negative for dysuria, frequency and urgency.  Musculoskeletal: Negative for falls.  Neurological: Positive for tingling. Negative for dizziness and loss of consciousness.  Endo/Heme/Allergies: Negative for polydipsia.     Physical Exam:  Vitals:   10/03/17 0852  BP: (!) 156/81  Pulse: 84  Temp: 97.7 F (36.5 C)  TempSrc: Oral  SpO2: 100%  Weight: 187 lb 11.2 oz (85.1 kg)  Height: 5\' 9"  (1.753 m)   Physical Exam  Constitutional: He is oriented to person, place, and time. He appears well-developed and well-nourished. No distress.  Cardiovascular: Normal rate and regular rhythm.   No murmur heard. Pulmonary/Chest: Effort normal. No respiratory distress. He has no wheezes. He has no rales.  Musculoskeletal:  S/p amputation of distal right 1st finger  Neurological: He is alert and oriented to person, place, and time.  Skin: Skin is warm. He is not diaphoretic.    Assessment & Plan:   See Encounters Tab for problem based charting.  Patient discussed with Dr. Daryll Drown  Type 2 diabetes mellitus with diabetic neuropathy ([place]) Repeat A1c today is 8.3. Previous trend has been 7.7 > 7.3 > 7.4. Patient with recent anxiety over his home blood glucose readings and has taken his Pioglitazone (Actos) six times per day over the last 2 weeks. Unfortunately, he did not bring his meter. He has been having increased neuropathic pain in his hands and feet as well. He is currently taking Gabapentin 300 mg once a day. He has had previous intolerance to Metformin.   I discussed several options today as far as changes we could make to his diabetes management including DPP4 inhibitors, Sulfonylureas, GLP-1 agonists, or retrialing low dose Metformin. He does not want  to retry Metformin due to his previous side effects. He would prefer an oral medication at this time rather than an injectable. - Will continue Pioglitazone 45 mg ONCE daily - Start Glipizide 2.5 mg once daily - Can use Gabapentin 300 mg TID - Continue dietary changes; exercise as tolerated - f/u scheduled in 2 months; see [place] sooner if needed

## 2017-10-03 NOTE — Assessment & Plan Note (Signed)
Repeat A1c today is 8.3. Previous trend has been 7.7 > 7.3 > 7.4. Patient with recent anxiety over his home blood glucose readings and has taken his Pioglitazone (Actos) six times per day over the last 2 weeks. Unfortunately, he did not bring his meter. He has been having increased neuropathic pain in his hands and feet as well. He is currently taking Gabapentin 300 mg once a day. He has had previous intolerance to Metformin.   I discussed several options today as far as changes we could make to his diabetes management including DPP4 inhibitors, Sulfonylureas, GLP-1 agonists, or retrialing low dose Metformin. He does not want to retry Metformin due to his previous side effects. He would prefer an oral medication at this time rather than an injectable. - Will continue Pioglitazone 45 mg ONCE daily - Start Glipizide 2.5 mg once daily - Can use Gabapentin 300 mg TID - Continue dietary changes; exercise as tolerated - f/u scheduled in 2 months; see [place] sooner if needed

## 2017-10-03 NOTE — Patient Instructions (Addendum)
It was a pleasure to meet you today Donald Palmer.  Your Hemoglobin A1c was 8.3 today.  For your diabetes: - Take Pioglitazone (Actos) 45 mg only ONCE per day - Start Glipizide 2.5 mg ONCE daily before breakfast - Continue to work on diet and avoid sugary beverages (sweet tea) - Can take Gabapentin 300 mg three times per day for your tingling pain  Please follow up with Dr. Lynnae January as scheduled or see [place] sooner if needed.  Diabetes Mellitus and Food It is important for you to manage your blood sugar (glucose) level. Your blood glucose level can be greatly affected by what you eat. Eating healthier foods in the appropriate amounts throughout the day at about the same time each day will help you control your blood glucose level. It can also help slow or prevent worsening of your diabetes mellitus. Healthy eating may even help you improve the level of your blood pressure and reach or maintain a healthy weight. General recommendations for healthful eating and cooking habits include:  Eating meals and snacks regularly. Avoid going long periods of time without eating to lose weight.  Eating a diet that consists mainly of plant-based foods, such as fruits, vegetables, nuts, legumes, and whole grains.  Using low-heat cooking methods, such as baking, instead of high-heat cooking methods, such as deep frying.  Work with your dietitian to make sure you understand how to use the Nutrition Facts information on food labels. How can food affect me? Carbohydrates Carbohydrates affect your blood glucose level more than any other type of food. Your dietitian will help you determine how many carbohydrates to eat at each meal and teach you how to count carbohydrates. Counting carbohydrates is important to keep your blood glucose at a healthy level, especially if you are using insulin or taking certain medicines for diabetes mellitus. Alcohol Alcohol can cause sudden decreases in blood glucose (hypoglycemia),  especially if you use insulin or take certain medicines for diabetes mellitus. Hypoglycemia can be a life-threatening condition. Symptoms of hypoglycemia (sleepiness, dizziness, and disorientation) are similar to symptoms of having too much alcohol. If your health care provider has given you approval to drink alcohol, do so in moderation and use the following guidelines:  Women should not have more than one drink per day, and men should not have more than two drinks per day. One drink is equal to: ? 12 oz of beer. ? 5 oz of wine. ? 1 oz of hard liquor.  Do not drink on an empty stomach.  Keep yourself hydrated. Have water, diet soda, or unsweetened iced tea.  Regular soda, juice, and other mixers might contain a lot of carbohydrates and should be counted.  What foods are not recommended? As you make food choices, it is important to remember that all foods are not the same. Some foods have fewer nutrients per serving than other foods, even though they might have the same number of calories or carbohydrates. It is difficult to get your body what it needs when you eat foods with fewer nutrients. Examples of foods that you should avoid that are high in calories and carbohydrates but low in nutrients include:  Trans fats (most processed foods list trans fats on the Nutrition Facts label).  Regular soda.  Juice.  Candy.  Sweets, such as cake, pie, doughnuts, and cookies.  Fried foods.  What foods can I eat? Eat nutrient-rich foods, which will nourish your body and keep you healthy. The food you should eat also will  depend on several factors, including:  The calories you need.  The medicines you take.  Your weight.  Your blood glucose level.  Your blood pressure level.  Your cholesterol level.  You should eat a variety of foods, including:  Protein. ? Lean cuts of meat. ? Proteins low in saturated fats, such as fish, egg whites, and beans. Avoid processed meats.  Fruits  and vegetables. ? Fruits and vegetables that may help control blood glucose levels, such as apples, mangoes, and yams.  Dairy products. ? Choose fat-free or low-fat dairy products, such as milk, yogurt, and cheese.  Grains, bread, pasta, and rice. ? Choose whole grain products, such as multigrain bread, whole oats, and brown rice. These foods may help control blood pressure.  Fats. ? Foods containing healthful fats, such as nuts, avocado, olive oil, canola oil, and fish.  Does everyone with diabetes mellitus have the same meal plan? Because every person with diabetes mellitus is different, there is not one meal plan that works for everyone. It is very important that you meet with a dietitian who will help you create a meal plan that is just right for you. This information is not intended to replace advice given to you by your health care provider. Make sure you discuss any questions you have with your health care provider. Document Released: 08/30/2005 Document Revised: 05/10/2016 Document Reviewed: 10/30/2013 Elsevier Interactive Patient Education  2017 [employer].

## 2017-10-04 NOTE — Progress Notes (Signed)
Internal Medicine Clinic Attending  Case discussed with Dr. Patel at the time of the visit.  We reviewed the resident's history and exam and pertinent patient test results.  I agree with the assessment, diagnosis, and plan of care documented in the resident's note.  

## 2017-10-14 ENCOUNTER — Other Ambulatory Visit: Payer: Self-pay | Admitting: Internal Medicine

## 2017-10-14 NOTE — Telephone Encounter (Signed)
gabapentin (NEURONTIN) 300 MG capsule, refill request @ [facility].

## 2017-11-11 ENCOUNTER — Other Ambulatory Visit: Payer: Self-pay | Admitting: Pharmacist

## 2017-11-11 NOTE — Patient Outreach (Signed)
Incoming call from [name] in response to the [facility] Medication Adherence Campaign. Speak with patient. HIPAA identifiers verified and verbal consent received.  Donald Palmer reports that he takes his simvastatin nightly at bedtime as directed. Reports that he might miss a dose about 2 times/week. Reports that he takes the medication right before bedtime. Counsel patient about the importance of medication adherence. Patient reports that he "just hasn't been paying attention to it." Discuss with patient strategies to improve medication adherence, such as taking the medication a little bit earlier in the evening so that he does not fall asleep before taking it, using an alarm on his phone and using a weekly pillbox. Patient verbalizes understanding.  Patient denies any further medication questions/concerns at this time.  Harlow Asa, PharmD, [facility] [phone]

## 2017-11-14 ENCOUNTER — Other Ambulatory Visit: Payer: Self-pay | Admitting: Internal Medicine

## 2017-11-14 NOTE — Telephone Encounter (Signed)
Patient requesting refill on muscle relaxers.

## 2017-11-14 NOTE — Telephone Encounter (Signed)
Next appt scheduled  11/28/17 with PCP.

## 2017-11-15 MED ORDER — CYCLOBENZAPRINE HCL 10 MG PO TABS: 10.0000 mg | ORAL_TABLET | Freq: Every evening | ORAL | 2 refills | Status: DC | PRN

## 2017-11-15 NOTE — Telephone Encounter (Signed)
duplicate

## 2017-11-27 ENCOUNTER — Telehealth: Payer: Self-pay | Admitting: Internal Medicine

## 2017-11-27 NOTE — Telephone Encounter (Signed)
A1C not due until Jan so utility of Dec CC appt low. Called pt to see if he is OK, any low or high sugars, and discussing cancelling 12/13 appt and resch CC for Jan.

## 2017-11-27 NOTE — Telephone Encounter (Signed)
Gars 125 - 135. No lows. No side effects to glip. OK to wait to JAn to F/U as too early for A1F.

## 2017-11-28 ENCOUNTER — Encounter: Payer: Medicare HMO | Admitting: Internal Medicine

## 2017-12-16 ENCOUNTER — Other Ambulatory Visit: Payer: Self-pay | Admitting: Internal Medicine

## 2017-12-18 NOTE — Telephone Encounter (Signed)
Got a 6 month refill in August and should have refills. Thanks

## 2017-12-23 ENCOUNTER — Other Ambulatory Visit: Payer: Self-pay

## 2017-12-23 ENCOUNTER — Encounter (INDEPENDENT_AMBULATORY_CARE_PROVIDER_SITE_OTHER): Payer: Self-pay

## 2017-12-23 ENCOUNTER — Encounter: Payer: Self-pay | Admitting: Internal Medicine

## 2017-12-23 ENCOUNTER — Ambulatory Visit (INDEPENDENT_AMBULATORY_CARE_PROVIDER_SITE_OTHER): Payer: Medicare HMO | Admitting: Internal Medicine

## 2017-12-23 VITALS — BP 127/63 | HR 86 | Temp 98.0°F | Ht 69.0 in | Wt 181.9 lb

## 2017-12-23 DIAGNOSIS — Z7984 Long term (current) use of oral hypoglycemic drugs: Secondary | ICD-10-CM

## 2017-12-23 DIAGNOSIS — R5383 Other fatigue: Secondary | ICD-10-CM | POA: Diagnosis not present

## 2017-12-23 DIAGNOSIS — M5116 Intervertebral disc disorders with radiculopathy, lumbar region: Secondary | ICD-10-CM | POA: Diagnosis not present

## 2017-12-23 DIAGNOSIS — G894 Chronic pain syndrome: Secondary | ICD-10-CM | POA: Diagnosis not present

## 2017-12-23 DIAGNOSIS — R5381 Other malaise: Secondary | ICD-10-CM

## 2017-12-23 DIAGNOSIS — M79605 Pain in left leg: Secondary | ICD-10-CM

## 2017-12-23 DIAGNOSIS — R200 Anesthesia of skin: Secondary | ICD-10-CM | POA: Diagnosis not present

## 2017-12-23 DIAGNOSIS — M545 Low back pain: Secondary | ICD-10-CM | POA: Diagnosis not present

## 2017-12-23 DIAGNOSIS — Z79899 Other long term (current) drug therapy: Secondary | ICD-10-CM

## 2017-12-23 DIAGNOSIS — Z79891 Long term (current) use of opiate analgesic: Secondary | ICD-10-CM | POA: Diagnosis not present

## 2017-12-23 DIAGNOSIS — E1140 Type 2 diabetes mellitus with diabetic neuropathy, unspecified: Secondary | ICD-10-CM | POA: Diagnosis not present

## 2017-12-23 LAB — POCT GLYCOSYLATED HEMOGLOBIN (HGB A1C): Hemoglobin A1C: 7.9

## 2017-12-23 LAB — GLUCOSE, CAPILLARY: Glucose-Capillary: 240 mg/dL — ABNORMAL HIGH (ref 65–99)

## 2017-12-23 NOTE — Progress Notes (Signed)
   CC: left leg pain and numbness  HPI:  [name] is a 57 y.o. male with history noted below that presents to the acute care clinic for left leg pain and numbness that started one month ago. He states he is sore throughout his entire body and thinks he has an infection. He denies fever/chills or nausea/vomiting. Patient also states that he feels numbness from his face down to his feet that is chronic. Patient denies abdominal pain or leg swelling and is able to tolerate oral intake. He states that he currently is adherent to his medications.  Past Medical History:  Diagnosis Date  . Back pain, chronic   . Diabetes mellitus 2007  . Hepatitis C    [id number]  . Hypertension goal BP (blood pressure) < 140/80   . Microcytic anemia   . Neuromuscular disorder ([place])   . Pancreatitis 01/09/2012    Review of Systems:  Review of Systems  Constitutional: Positive for malaise/fatigue. Negative for chills and fever.  Respiratory: Negative for shortness of breath.   Cardiovascular: Negative for chest pain.  Gastrointestinal: Negative for abdominal pain, nausea and vomiting.  Neurological: Negative for weakness.     Physical Exam:  Vitals:   12/23/17 0927  BP: 127/63  Pulse: 86  Temp: 98 F (36.7 C)  TempSrc: Oral  SpO2: 100%  Weight: 181 lb 14.4 oz (82.5 kg)  Height: 5\' 9"  (1.753 m)   Physical Exam  Constitutional: He is well-developed, well-nourished, and in no distress.  Cardiovascular: Normal rate, regular rhythm and normal heart sounds. Exam reveals no gallop and no friction rub.  No murmur heard. Pulmonary/Chest: Effort normal and breath sounds normal. No respiratory distress. He has no wheezes. He has no rales.  Musculoskeletal: He exhibits no edema.  No calf tenderness   Neurological:  5/5 motor strength in upper and lower extremities bilaterally  Decreased sensation noted on top of toes bilaterally   Skin: Skin is warm and dry. No rash noted. No erythema.  No open  wounds on feet bilaterally     Assessment & Plan:   See encounters tab for problem based medical decision making.    Patient discussed with Dr. Daryll Drown

## 2017-12-23 NOTE — Assessment & Plan Note (Signed)
Assessment: Malaise Patient describes a total body soreness including numbness throughout his body and feelings of fatigue for the past month. He specifically states that his left  leg is painful and numb. On exam there is no calf tenderness or erythema noted.  Will obtain lab work including CBC, ERS, CRP, Vitamin D, RPR, HIV  Plan -CBC, ERS, CRP, Vitamin D, RPR, HIV

## 2017-12-23 NOTE — Assessment & Plan Note (Signed)
Assessment: Uncontrolled type 2 diabetes with diabetic neuropathy Patient currently takes pioglitazone 40 mg once daily and glipizide 2.5 mg.  Previous hemoglobin A1c was 8.3 on 09/2017. Today's hemoglobin A1c is 7.9.  Will continue with glipizide and pioglitazone at current dose and have patient follow up with PCP.  Will also refer to podiatry per patient request  Plan -Continue glipizide and pioglitazone - podiatry

## 2017-12-23 NOTE — Patient Instructions (Signed)
Mr. Krygier,  It was a pleasure meeting you today. Imaging for your spine has been ordered. Please follow up with your primary care provider at your next appointment.

## 2017-12-23 NOTE — Assessment & Plan Note (Signed)
Assessment:  Chronic pain syndrome Patient had an MRI in 2013 that showed mild disc bulging at L4-L5 with bilateral mild foraminal encroachment.  He is being treated for chronic low back pain with intermittent right radicular pain with cyclobenzaprine, tramadol, Celebrex and gabapentin. Today he states that his left leg now has numbness and radicular pain. He was referred to neurosurgery in august 2018.  Per Dr. Hewitt Shorts note an MRI lumbar spine w/o contrast was recommended.  Since patient's symptoms are now worsening on the left side with conservative treatment I would agree MRI lumbar spine is needed.  Will order this.  Plan -MRI lumbar spine w/o contrast

## 2017-12-24 ENCOUNTER — Ambulatory Visit (INDEPENDENT_AMBULATORY_CARE_PROVIDER_SITE_OTHER): Payer: Medicare HMO | Admitting: Internal Medicine

## 2017-12-24 ENCOUNTER — Ambulatory Visit: Payer: Medicare HMO

## 2017-12-24 ENCOUNTER — Ambulatory Visit (HOSPITAL_COMMUNITY)
Admission: RE | Admit: 2017-12-24 | Discharge: 2017-12-24 | Disposition: A | Discharge status: Home / Self Care | Payer: Medicare HMO | Source: Ambulatory Visit | Attending: Internal Medicine | Admitting: Internal Medicine

## 2017-12-24 VITALS — BP 159/76 | HR 85 | Wt 183.7 lb

## 2017-12-24 DIAGNOSIS — E559 Vitamin D deficiency, unspecified: Secondary | ICD-10-CM | POA: Insufficient documentation

## 2017-12-24 DIAGNOSIS — M79605 Pain in left leg: Secondary | ICD-10-CM

## 2017-12-24 DIAGNOSIS — I82812 Embolism and thrombosis of superficial veins of left lower extremities: Secondary | ICD-10-CM | POA: Insufficient documentation

## 2017-12-24 DIAGNOSIS — Z7982 Long term (current) use of aspirin: Secondary | ICD-10-CM

## 2017-12-24 DIAGNOSIS — G8929 Other chronic pain: Secondary | ICD-10-CM

## 2017-12-24 DIAGNOSIS — I739 Peripheral vascular disease, unspecified: Secondary | ICD-10-CM | POA: Diagnosis not present

## 2017-12-24 LAB — VITAMIN D 25 HYDROXY (VIT D DEFICIENCY, FRACTURES): Vit D, 25-Hydroxy: 19 ng/mL — ABNORMAL LOW (ref 30.0–100.0)

## 2017-12-24 LAB — CBC WITH DIFFERENTIAL/PLATELET
Basophils Absolute: 0 10*3/uL (ref 0.0–0.2)
Basos: 1 %
EOS (ABSOLUTE): 0.1 10*3/uL (ref 0.0–0.4)
Eos: 1 %
Hematocrit: 45.4 % (ref 37.5–51.0)
Hemoglobin: 14.4 g/dL (ref 13.0–17.7)
IMMATURE GRANULOCYTES: 0 %
Immature Grans (Abs): 0 10*3/uL (ref 0.0–0.1)
Lymphocytes Absolute: 3.1 10*3/uL (ref 0.7–3.1)
Lymphs: 50 %
MCH: 23.1 pg — ABNORMAL LOW (ref 26.6–33.0)
MCHC: 31.7 g/dL (ref 31.5–35.7)
MCV: 73 fL — AB (ref 79–97)
MONOS ABS: 0.6 10*3/uL (ref 0.1–0.9)
Monocytes: 10 %
NEUTROS PCT: 38 %
Neutrophils Absolute: 2.3 10*3/uL (ref 1.4–7.0)
PLATELETS: 281 10*3/uL (ref 150–379)
RBC: 6.24 x10E6/uL — AB (ref 4.14–5.80)
RDW: 15.8 % — AB (ref 12.3–15.4)
WBC: 6.1 10*3/uL (ref 3.4–10.8)

## 2017-12-24 LAB — TSH: TSH: 1.02 u[IU]/mL (ref 0.450–4.500)

## 2017-12-24 LAB — HIV ANTIBODY (ROUTINE TESTING W REFLEX): HIV Screen 4th Generation wRfx: NONREACTIVE

## 2017-12-24 LAB — C-REACTIVE PROTEIN: CRP: 1.4 mg/L (ref 0.0–4.9)

## 2017-12-24 LAB — RPR: RPR Ser Ql: NONREACTIVE

## 2017-12-24 LAB — SEDIMENTATION RATE: SED RATE: 11 mm/h (ref 0–30)

## 2017-12-24 MED ORDER — CILOSTAZOL 100 MG PO TABS: 100.0000 mg | ORAL_TABLET | Freq: Two times a day (BID) | ORAL | 2 refills | Status: DC

## 2017-12-24 MED ORDER — TRAMADOL HCL 50 MG PO TABS: 50.0000 mg | ORAL_TABLET | Freq: Four times a day (QID) | ORAL | 0 refills | Status: DC | PRN

## 2017-12-24 MED ORDER — ASPIRIN EC 81 MG PO TBEC: 81.0000 mg | DELAYED_RELEASE_TABLET | Freq: Every day | ORAL | 2 refills | Status: DC

## 2017-12-24 MED ORDER — VITAMIN D 1000 UNITS PO TABS: 1000.0000 [IU] | ORAL_TABLET | Freq: Every day | ORAL | 2 refills | Status: DC

## 2017-12-24 MED ORDER — KETOROLAC TROMETHAMINE 30 MG/ML IJ SOLN
15.0000 mg | Freq: Once | INTRAMUSCULAR | Status: AC
  Administered 2017-12-24: 15 mg via INTRAMUSCULAR

## 2017-12-24 NOTE — Patient Instructions (Addendum)
Donald Palmer,  Referral has been made to better assess your left leg pain. Please start taking aspirin daily and cilostazol two times a day.  Please obtain imaging for your lower leg in order to rule out a clot.

## 2017-12-24 NOTE — Assessment & Plan Note (Addendum)
Assessment:  left leg pain Patient was recently seen in the acute care clinic yesterday 1/7 for generalized malaise and chronic pain. Blood work was obtained including CBC, TSH, sedimentation rate and CRP, RPR and HIV and vitamin D. WBC was 6.1, sedimentation rate TSH and CRP were within normal limits. RPR and HIV was nonreactive. Vitamin D was low at 19. Patient continues to complain of left lower extremity pain and in office ABIs were obtained.  Left ABI noted PAD and right ABI was normal at 1.12.  Will refer to [place] care and start aspirin, Cilostazol and do a short course of tramadol.  Will also obtain US to rule out DVT.  Plan -aspirin, cilostazol and tramadol -referral to [facility] heart care - Ultrasound lower extremity

## 2017-12-24 NOTE — Progress Notes (Signed)
   CC: Follow up on left leg pain  HPI:  [name] is a 57 y.o. male with history noted below that presents to the acute care clinic for follow-up on left leg pain. Please see problem based charting for the status of patient's chronic medical conditions.  Past Medical History:  Diagnosis Date  . Back pain, chronic   . Diabetes mellitus 2007  . Hepatitis C    [id number]  . Hypertension goal BP (blood pressure) < 140/80   . Microcytic anemia   . Neuromuscular disorder ([place])   . Pancreatitis 01/09/2012    Review of Systems:  Review of Systems  Constitutional: Negative for malaise/fatigue.  Respiratory: Negative for shortness of breath.   Cardiovascular: Negative for chest pain.  Neurological: Negative for weakness.     Physical Exam:  Vitals:   12/24/17 0929  BP: (!) 159/76  Pulse: 85  SpO2: 98%  Weight: 183 lb 11.2 oz (83.3 kg)   Physical Exam  Constitutional: He is well-developed, well-nourished, and in no distress.  Skin:  2+ pedal pulses bilaterally, no erythema noted in legs bilaterally, no calf tenderness    Assessment & Plan:   See encounters tab for problem based medical decision making.    Patient discussed with Dr. Daryll Drown

## 2017-12-24 NOTE — Progress Notes (Signed)
Internal Medicine Clinic Attending  Case discussed with Dr. Hoffman at the time of the visit.  We reviewed the resident's history and exam and pertinent patient test results.  I agree with the assessment, diagnosis, and plan of care documented in the resident's note.  

## 2017-12-24 NOTE — Assessment & Plan Note (Signed)
Assessment: Vitamin D deficiency Vitamin D level is 19. Will start patient on vitamin D replacement.  Plan Vitamin D 800 units daily

## 2017-12-24 NOTE — Addendum Note (Signed)
Addended by: Marcelino Duster on: 12/24/2017 11:44 AM   Modules accepted: Orders

## 2017-12-24 NOTE — Progress Notes (Signed)
LLE venous duplex prelim: negative for DVT. Superficial thrombosis noted in the left small saphenous vein. Landry Mellow, RDMS, RVT Called results to Dr. [place].

## 2018-01-03 ENCOUNTER — Telehealth: Payer: Self-pay | Admitting: Internal Medicine

## 2018-01-03 ENCOUNTER — Ambulatory Visit: Payer: Medicare HMO

## 2018-01-03 NOTE — Telephone Encounter (Signed)
PT CALLED WITH LEG PAIN HAS BLOOD CLOD IN LEG AND REFUSED APPT. TO SEE DOC, GAVE CALL TO NURSE.

## 2018-01-06 ENCOUNTER — Other Ambulatory Visit: Payer: Self-pay | Admitting: Internal Medicine

## 2018-01-06 DIAGNOSIS — I152 Hypertension secondary to endocrine disorders: Secondary | ICD-10-CM

## 2018-01-06 DIAGNOSIS — J0101 Acute recurrent maxillary sinusitis: Secondary | ICD-10-CM

## 2018-01-06 DIAGNOSIS — E1140 Type 2 diabetes mellitus with diabetic neuropathy, unspecified: Secondary | ICD-10-CM

## 2018-01-08 ENCOUNTER — Ambulatory Visit (INDEPENDENT_AMBULATORY_CARE_PROVIDER_SITE_OTHER): Payer: Medicare HMO | Admitting: Internal Medicine

## 2018-01-08 ENCOUNTER — Other Ambulatory Visit: Payer: Self-pay

## 2018-01-08 VITALS — BP 148/82 | HR 89 | Temp 97.9°F | Ht 69.0 in | Wt 191.8 lb

## 2018-01-08 DIAGNOSIS — Z7982 Long term (current) use of aspirin: Secondary | ICD-10-CM | POA: Diagnosis not present

## 2018-01-08 DIAGNOSIS — I10 Essential (primary) hypertension: Secondary | ICD-10-CM

## 2018-01-08 DIAGNOSIS — M79605 Pain in left leg: Secondary | ICD-10-CM

## 2018-01-08 DIAGNOSIS — Z79891 Long term (current) use of opiate analgesic: Secondary | ICD-10-CM

## 2018-01-08 DIAGNOSIS — G8929 Other chronic pain: Secondary | ICD-10-CM | POA: Diagnosis not present

## 2018-01-08 DIAGNOSIS — M48061 Spinal stenosis, lumbar region without neurogenic claudication: Secondary | ICD-10-CM

## 2018-01-08 DIAGNOSIS — E1151 Type 2 diabetes mellitus with diabetic peripheral angiopathy without gangrene: Secondary | ICD-10-CM

## 2018-01-08 DIAGNOSIS — M549 Dorsalgia, unspecified: Secondary | ICD-10-CM | POA: Diagnosis not present

## 2018-01-08 DIAGNOSIS — E1140 Type 2 diabetes mellitus with diabetic neuropathy, unspecified: Secondary | ICD-10-CM

## 2018-01-08 DIAGNOSIS — Z7984 Long term (current) use of oral hypoglycemic drugs: Secondary | ICD-10-CM | POA: Diagnosis not present

## 2018-01-08 MED ORDER — TRAMADOL HCL 50 MG PO TABS: 50.0000 mg | ORAL_TABLET | Freq: Four times a day (QID) | ORAL | 0 refills | Status: DC | PRN

## 2018-01-08 MED ORDER — GABAPENTIN 300 MG PO CAPS: 600.0000 mg | ORAL_CAPSULE | Freq: Three times a day (TID) | ORAL | 0 refills | Status: DC

## 2018-01-08 MED ORDER — GLUCOSE BLOOD VI STRP: ORAL_STRIP | 3 refills | Status: DC

## 2018-01-08 MED ORDER — KETOROLAC TROMETHAMINE 30 MG/ML IJ SOLN
30.0000 mg | Freq: Once | INTRAMUSCULAR | Status: AC
  Administered 2018-01-08: 30 mg via INTRAMUSCULAR

## 2018-01-08 MED ORDER — GLIPIZIDE ER 2.5 MG PO TB24: 2.5000 mg | ORAL_TABLET | Freq: Every day | ORAL | 0 refills | Status: DC

## 2018-01-08 NOTE — Assessment & Plan Note (Signed)
Assessment Currently on pioglitazone 40 mg once daily and glipizide 2.5 mg daily. He reports recently his blood sugars have been elevated more than normal, in the 150s to 200s range. He forgot to bring his glucometer today. A1c on 12/23/2017 was 7.9. Recent elevation of blood glucose may be related to pain and acute illness. Given that he does not have his glucometer today, we will not make any changes to his medications. He has follow-up with his PCP next week. Patient was reminded to bring his glucometer at that visit.  Plan - Continue pioglitazone 40 mg once daily - Refilled glipizide 2.5 mg daily - Follow up with PCP on 1/31

## 2018-01-08 NOTE — Patient Instructions (Addendum)
FOLLOW-UP INSTRUCTIONS When: 01/16/2018 (appointment already scheduled) For: BP and diabetes management What to bring: medications, glucometer   Donald Palmer,  It was a pleasure to meet you today.  Your leg pain is most likely due to nerve pain from long-term diabetes. We are changing the dose of your gabapentin. You will take 600mg  three times a day of your gabapentin. - For the next 3 days: please take 300mg  in the morning, 300mg  in the afternoon, and 600mg  at night. - After 3 more days (on 1/26): please take 300mg  in the morning, 600mg  in the afternoon, and 600mg  at night. - After 3 more days (on 1/29): please take 600mg  in the morning, 600mg  in the afternoon, and 600mg  at night.  You have an appointment with the heart doctors on 2/12.  Please follow up with our clinic on 1/31.

## 2018-01-08 NOTE — Assessment & Plan Note (Addendum)
Assessment He was seen in clinic on 1/7 and 1/8 for left leg pain. He returns with persistent left leg pain. Blood work has been normal thus far, with the exception of low vitamin D at 19. ABIs with PAD and left lower extremity. Referral placed to [facility] heart care and patient was initiated on aspirin, cilostazol, and short course of tramadol. Left lower extremity ultrasound was performed which was negative for DVT, but showed superficial thrombosis in left small saphenous vein. Unrelieved by cyclobenzaprine, gabapentin, tramadol, Celebrex, cilostazol, and aspirin.  Physical exam notable for no edema, erythema, or open wounds on his left lower extremity. He has normal strength but does endorse numbness and tenderness to palpation/paresthesias in his left lower extremity. Extremities are warm and without pallor.  Differential includes diabetic neuropathy, pain from superficial thrombophlebitis, lumbar radiculopathy, and PAD. The description and location of his pain does sound like diabetic neuropathy, however his pain is unilateral which is less consistent. Pain is unlikely to be from superficial thrombophlebitis given the severity of his pain. He does have known stenosis in his lumbar spine. He states that he has had radiculopathy in the past, however this was localized to his right lower extremity before. MRI order is in place. He also was found to have PAD in his left lower extremity, which could also be contributing to his pain. Referral was placed for [facility] at his last visit - he has an appointment scheduled for 2/12.  Plan - Increase gabapentin to 600 mg 3 times a day (Patient provided with instructions to ramp-up to 600 TID) --- Can consider switching to Lyrica if patient develops side effects on increased dose of gabapentin - MRI lumbar spine order already placed - Follow up with [facility] on 2/12 - Refilled short course of tramadol while undergoing work-up - Follow up with PCP on  1/31 (appointment already scheduled)

## 2018-01-08 NOTE — Progress Notes (Signed)
Internal Medicine Clinic Attending  I saw and evaluated the patient.  I personally confirmed the key portions of the history and exam documented by Dr. Huang and I reviewed pertinent patient test results.  The assessment, diagnosis, and plan were formulated together and I agree with the documentation in the resident's note.  

## 2018-01-08 NOTE — Progress Notes (Signed)
   CC: leg pain and elevated BG  HPI:  Mr.Matheu D Beckom is a 57 y.o. male with PMH of diabetes, hypertension, and chronic back pain who presents for follow up of pain in leg and elevated blood glucose.  He was seen in clinic on 1/7 with total body soreness, fatigue, and in particular painful and numb left leg. Lab work, including CBC, TSH, ESR, CRP, RPR, and HIV were within normal limits. He will return to clinic on 1/8 with persistent left leg pain. Left ABI performed in clinic showed PAD, with normal right ABI. He was referred to [facility] heart care and started on aspirin, cilostazol, and tramadol. Left lower extremity venous duplex ultrasound was ordered, which was negative for DVT, however superficial thrombosis noted in the left small saphenous vein.  He returns today with persistent left lower extremity pain. He reports that it is burning achy pain and feels numb and tingling. He states that it is often worse with walking, however it is now to the point where it hurts all the time. The pain is exacerbated by moving his foot a certain way at night and elevating his leg too high. He feels as if his "toes are going to explode" and that it feels like "someone is sticking needles in my toes". He reports that he has been taking all of the medications he has been prescribed.  He did not bring his glucometer today however he states in the last few days it has been more elevated than normal, in the 150s to 200s range. He does smoke 3-4 cigarettes a week.  Past Medical History:  Diagnosis Date  . Back pain, chronic   . Diabetes mellitus 2007  . Hepatitis C    [id number]  . Hypertension goal BP (blood pressure) < 140/80   . Microcytic anemia   . Neuromuscular disorder ([place])   . Pancreatitis 01/09/2012   Review of Systems:   GEN: Negative for fevers CV: Negative for chest pain PULM: Negative for SOB MSK: Positive for chronic back pain and left LE burning pain and numbness.  Physical Exam:  Vitals:    01/08/18 0958  BP: (!) 148/82  Pulse: 89  Temp: 97.9 F (36.6 C)  TempSrc: Oral  SpO2: 100%  Weight: 191 lb 12.8 oz (87 kg)  Height: '5\' 9"'$  (1.753 m)   GEN: Sitting in chair comfortably in NAD MSK: No edema or erythema of LLE. No open wounds present on LLE. TTP on left posterior calf, sole of foot, and toes. Normal strength (limited 2/2 pain). Numbness in LLE. Difficult to palpate DP on LLE. Extremities are warm and without pallor.  Assessment & Plan:   See Encounters Tab for problem based charting.  Patient seen with Dr. Angelia Mould

## 2018-01-13 DIAGNOSIS — B351 Tinea unguium: Secondary | ICD-10-CM | POA: Diagnosis not present

## 2018-01-13 DIAGNOSIS — M7989 Other specified soft tissue disorders: Secondary | ICD-10-CM | POA: Diagnosis not present

## 2018-01-13 DIAGNOSIS — B353 Tinea pedis: Secondary | ICD-10-CM | POA: Diagnosis not present

## 2018-01-15 ENCOUNTER — Telehealth: Payer: Self-pay

## 2018-01-15 NOTE — Telephone Encounter (Signed)
rtc to pt, he is wondering why the foot doctor prescribed him medicine without looking at his foot and changed his "blood thinner" also the medicine was $60.00 and he cant afford that also the podiatrist sent his scripts to some place in [place] not his usual [employer] . He was ask to call the podiatrist office and ask these questions and to also tell them to make sure his scripts go to [facility]. He was agreeable

## 2018-01-15 NOTE — Telephone Encounter (Signed)
Needs to speak with a nurse about med. Please call pt back.  

## 2018-01-15 NOTE — Telephone Encounter (Signed)
Thanks I do not have access to podiatry's notes

## 2018-01-15 NOTE — Telephone Encounter (Signed)
I ask him to have them send [place] the notes, I will call the office just in case

## 2018-01-15 NOTE — Telephone Encounter (Signed)
Left message for the office to call triage

## 2018-01-16 ENCOUNTER — Encounter: Payer: Medicare HMO | Admitting: Internal Medicine

## 2018-01-16 ENCOUNTER — Encounter: Payer: Self-pay | Admitting: Internal Medicine

## 2018-01-16 NOTE — Telephone Encounter (Signed)
Spoke to [facility], pt was prescribed some new creams for his feet but his pletal was not changed, they will call him and assist him with better understanding of the meds and where they will come from and also recheck his insurance info because they do not have his medicaid card

## 2018-01-20 DIAGNOSIS — M79672 Pain in left foot: Secondary | ICD-10-CM | POA: Diagnosis not present

## 2018-01-20 DIAGNOSIS — E1142 Type 2 diabetes mellitus with diabetic polyneuropathy: Secondary | ICD-10-CM | POA: Diagnosis not present

## 2018-01-20 DIAGNOSIS — E0842 Diabetes mellitus due to underlying condition with diabetic polyneuropathy: Secondary | ICD-10-CM | POA: Diagnosis not present

## 2018-01-22 ENCOUNTER — Telehealth: Payer: Self-pay | Admitting: Internal Medicine

## 2018-01-22 NOTE — Telephone Encounter (Signed)
Patient went to [facility],  He got a shot, he is wanting to see if can get it in a pills forms. He also said they told him he have type 1 & 2

## 2018-01-23 NOTE — Telephone Encounter (Signed)
No answer, rang and rang

## 2018-01-24 NOTE — Telephone Encounter (Signed)
Called pt - no answer; left message to give us a call back. 

## 2018-01-24 NOTE — Telephone Encounter (Signed)
States went to [place] and was given a shot for leg/feet pain. Tramadol and Gabapentin are not helping with the pain. Stated he took 800 mg Motrin this am which seemed to "do better".

## 2018-01-24 NOTE — Telephone Encounter (Signed)
He should not take IBU as he is on celebrex. Pain seems to be flaring freq recently. pls have him sch appt just to focus on pain - ACC or CC. Or he can get back into phys med - over due for appt.Pls make certain he is taking gaba sch and not PRN. Thanks

## 2018-01-27 DIAGNOSIS — M792 Neuralgia and neuritis, unspecified: Secondary | ICD-10-CM | POA: Diagnosis not present

## 2018-01-27 DIAGNOSIS — M7989 Other specified soft tissue disorders: Secondary | ICD-10-CM | POA: Diagnosis not present

## 2018-01-27 DIAGNOSIS — G609 Hereditary and idiopathic neuropathy, unspecified: Secondary | ICD-10-CM | POA: Diagnosis not present

## 2018-01-27 DIAGNOSIS — B351 Tinea unguium: Secondary | ICD-10-CM | POA: Diagnosis not present

## 2018-01-28 ENCOUNTER — Ambulatory Visit: Payer: Medicare HMO | Admitting: Interventional Cardiology

## 2018-01-28 NOTE — Telephone Encounter (Signed)
Pt called / informed "He should not take IBU as he is on celebrex" per Dr Lynnae January. Stated he knows not to take it . Has an appt on Thurs he stated will keep.

## 2018-01-29 ENCOUNTER — Encounter: Payer: Self-pay | Admitting: Internal Medicine

## 2018-01-29 DIAGNOSIS — I739 Peripheral vascular disease, unspecified: Secondary | ICD-10-CM | POA: Insufficient documentation

## 2018-01-30 ENCOUNTER — Ambulatory Visit (INDEPENDENT_AMBULATORY_CARE_PROVIDER_SITE_OTHER): Payer: Medicare HMO | Admitting: Internal Medicine

## 2018-01-30 ENCOUNTER — Encounter: Payer: Self-pay | Admitting: Internal Medicine

## 2018-01-30 ENCOUNTER — Other Ambulatory Visit: Payer: Self-pay

## 2018-01-30 ENCOUNTER — Other Ambulatory Visit: Payer: Self-pay | Admitting: Pharmacist

## 2018-01-30 VITALS — BP 141/108 | Temp 98.1°F | Wt 195.0 lb

## 2018-01-30 DIAGNOSIS — Z86718 Personal history of other venous thrombosis and embolism: Secondary | ICD-10-CM

## 2018-01-30 DIAGNOSIS — I739 Peripheral vascular disease, unspecified: Secondary | ICD-10-CM

## 2018-01-30 DIAGNOSIS — Z8679 Personal history of other diseases of the circulatory system: Secondary | ICD-10-CM | POA: Diagnosis not present

## 2018-01-30 DIAGNOSIS — Z7902 Long term (current) use of antithrombotics/antiplatelets: Secondary | ICD-10-CM

## 2018-01-30 DIAGNOSIS — E1151 Type 2 diabetes mellitus with diabetic peripheral angiopathy without gangrene: Secondary | ICD-10-CM

## 2018-01-30 DIAGNOSIS — R6889 Other general symptoms and signs: Secondary | ICD-10-CM | POA: Diagnosis not present

## 2018-01-30 DIAGNOSIS — G894 Chronic pain syndrome: Secondary | ICD-10-CM

## 2018-01-30 DIAGNOSIS — G629 Polyneuropathy, unspecified: Secondary | ICD-10-CM | POA: Diagnosis not present

## 2018-01-30 DIAGNOSIS — E1140 Type 2 diabetes mellitus with diabetic neuropathy, unspecified: Secondary | ICD-10-CM

## 2018-01-30 DIAGNOSIS — R269 Unspecified abnormalities of gait and mobility: Secondary | ICD-10-CM | POA: Diagnosis not present

## 2018-01-30 DIAGNOSIS — Z7982 Long term (current) use of aspirin: Secondary | ICD-10-CM | POA: Diagnosis not present

## 2018-01-30 DIAGNOSIS — Z79899 Other long term (current) drug therapy: Secondary | ICD-10-CM | POA: Diagnosis not present

## 2018-01-30 DIAGNOSIS — Z7984 Long term (current) use of oral hypoglycemic drugs: Secondary | ICD-10-CM

## 2018-01-30 DIAGNOSIS — I700 Atherosclerosis of aorta: Secondary | ICD-10-CM

## 2018-01-30 MED ORDER — CILOSTAZOL 100 MG PO TABS: 100.0000 mg | ORAL_TABLET | Freq: Two times a day (BID) | ORAL | 1 refills | Status: DC

## 2018-01-30 MED ORDER — PREGABALIN 25 MG PO CAPS: 25.0000 mg | ORAL_CAPSULE | Freq: Three times a day (TID) | ORAL | 1 refills | Status: DC

## 2018-01-30 MED ORDER — GLIPIZIDE ER 2.5 MG PO TB24: 2.5000 mg | ORAL_TABLET | Freq: Every day | ORAL | 1 refills | Status: DC

## 2018-01-30 MED ORDER — NICOTINE 10 MG IN INHA: RESPIRATORY_TRACT | 3 refills | Status: DC

## 2018-01-30 MED ORDER — KETOROLAC TROMETHAMINE 30 MG/ML IJ SOLN
30.0000 mg | Freq: Once | INTRAMUSCULAR | Status: AC
  Administered 2018-01-30: 30 mg via INTRAMUSCULAR

## 2018-01-30 NOTE — Progress Notes (Signed)
   Subjective:    [name], male    DOB: October 24, 1961, 57 y.o.   MRN: [id number]  HPI  IFEANYI Palmer is here for L leg pain. Please see the A&P for the status of the pt's chronic medical problems.  ROS : per ROS section and in problem oriented charting. All other systems are negative.  PMHx, Soc hx, and / or Fam hx : Due tot pain and trouble walking, gave up apartment. Living with male friend. 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   Review of Systems  Musculoskeletal: Positive for back pain and gait problem.  Neurological: Positive for weakness.       Objective:   Physical Exam  Constitutional: He appears well-developed and well-nourished. No distress.  HENT:  Head: Normocephalic and atraumatic.  Right Ear: External ear normal.  Left Ear: External ear normal.  Nose: Nose normal.  Eyes: Conjunctivae and EOM are normal. Right eye exhibits no discharge. Left eye exhibits no discharge. No scleral icterus.  Cardiovascular: Normal rate, regular rhythm and normal heart sounds.  No murmur heard. Musculoskeletal: He exhibits tenderness. He exhibits no edema or deformity.  Neurological: He is alert.  Skin: Skin is warm and dry. He is not diaphoretic.  + tattoos   Psychiatric: He has a normal mood and affect. His behavior is normal. Judgment and thought content normal.      Assessment & Plan:

## 2018-01-30 NOTE — Assessment & Plan Note (Signed)
This problem is chronic and stable. He is on Actos 45 and glipizide 2.5. His most recent A1c was 7.9 in January of this year. The prior A1c was 8.3. He is having no hypoglycemia. His long reveals that he is not checking frequently and had 22 test over a three-month period. His morning average is 159, late morning 152 mid afternoon 192. This indicates he may not have excellent control but has he had other more urgent issues today, and it is too early for an A1c, this will be delayed for his next appointment.  PLAN:  Cont current meds

## 2018-01-30 NOTE — Progress Notes (Signed)
Initiated Nicotrol for smoking cessation

## 2018-01-30 NOTE — Assessment & Plan Note (Addendum)
This problem is chronic and uncontrolled. He is here to follow-up on his left lower extremity pain. He states this has been going on since December 2018. He has been seen in the [facility] twice in January for this issue. He has had a lower extremity Doppler which showed a superficial acute thrombosis of the small saphenous vein. He also had ABIs in the clinic which indicated peripheral arterial disease in the left leg. He has had an MRI ordered for quite some time but has not completed this. He has been seen by neurosurgery but I do not have those records. The MRI and neurosurgery referral however were for right radicular pain.  He states his right toes are numb and the right plantar surface of his right foot burns. His left toes are numb and burns and this goes proximally to his knee with the posterior valf being much more symptomatic than the anterior surface of the lower extremity. He cannot remember if the numbness and burning started all at once or started distally and progressed proximally. The left hurts all the time but walking makes the pain worse. The pain will also wake him up at night. When this happens, sometimes putting the foot over the side of the bed will help to decrease but not relieve the pain. Other times it doesn't help and he has to get up and walk around to get the blood flowing. He is never without the pain in the left lower extremity. He does not think the gabapentin, which he is taking 2100 mg a day, is helping. He is also taking Celebrex but is not taking tramadol because he thinks it makes his sugar go up. He also has a prescription for Flexeril. He saw podiatry recently got a skin biopsy for small fiber neuropathy. I do not have access to their notes. He has not yet received the results of the biopsy.   He also states that his hands bilaterally numb and that he is dropping things out of his hands bilaterally. He also feels that his hands are swollen. He does not know if the hand started  after the lower extremity neuropathy symptoms.  On exam, he has a palpable right DP pulse but I cannot palpate the left DP pulse. His left DP pulse is dopplerable and biphasic. His left foot is cooler than the right foot.  Is identical on the left toes as the right toes. There are no ulcers or skin breakdown on the left foot. Foot exam as documented in the EHR. There is no skin mottling or white toes. He has +2 radial pulses bilaterally.  Assessment : The etiology of his left lower extremity pain is likely multifactorial. He likely has a component of a neuropathy that is causing the burning and numbness sensations bilaterally. Diabetes would be the most likely etiology but as this may not have been a gradual onset, distal to proximal, I'm going to assess for other etiologies of neuropathy. He also has some concerning signs and symptoms for peripheral arterial disease. His ABIs are abnormal and he does not have a palpable left DP pulse. He has an appointment with vascular on February 19. He is on aspirin, statin, and Pletal to treat his presumed PAD. Additionally, I am encouraged him to quit tobacco use as that is only going to exacerbate his symptoms.  PLAN : DC gabapentin Lyrica 25 3 times a day I encouraged him not to use a muscle relaxant as I do not think that this is going  to help the left lower extremity I removed tramadol from his medication list since he is not taking it as it causes hypoglycemia Follow-up skin biopsy for small fiber neuropathy Follow-up with vascular on February 19 Tobacco cessation and he was agreeable to try nicotine inhalers Continue Pletal, aspirin, statin Handicap sticker for 6 months only TSH, B12, SPEP

## 2018-01-30 NOTE — Assessment & Plan Note (Signed)
This problem is chronic and uncontrolled. He is on lisinopril 20 daily. He is having no side effects to this medication. His blood pressure is uncontrolled today that he is in acute pain.  PLAN:  Cont current meds F/U BP 1 month   BP Readings from Last 3 Encounters:  01/30/18 (!) 141/108  01/08/18 (!) 148/82  12/24/17 (!) 159/76

## 2018-01-30 NOTE — Patient Instructions (Addendum)
Dr. Lorretta Harp  [place] # [place]  Your pain might be due to neuropathy  1. I am checking more labs  2. Might be due tot your diabetes  3. I am changing gabapentin to lyrica  Or might be due to decreased blood flow in left leg  1. See Dr Gwenlyn Found  2. Stop smoking  3. Use nicotine inhaler as needed to stop smoking

## 2018-01-30 NOTE — Assessment & Plan Note (Signed)
-   See "chronic pain"

## 2018-01-31 LAB — PROTEIN ELECTROPHORESIS, SERUM, WITH REFLEX
A/G Ratio: 1.1 (ref 0.7–1.7)
ALBUMIN ELP: 3.8 g/dL (ref 2.9–4.4)
Alpha 1: 0.3 g/dL (ref 0.0–0.4)
Alpha 2: 0.8 g/dL (ref 0.4–1.0)
Beta: 1.1 g/dL (ref 0.7–1.3)
GLOBULIN, TOTAL: 3.4 g/dL (ref 2.2–3.9)
Gamma Globulin: 1.2 g/dL (ref 0.4–1.8)
Total Protein: 7.2 g/dL (ref 6.0–8.5)

## 2018-01-31 LAB — TSH: TSH: 1.11 u[IU]/mL (ref 0.450–4.500)

## 2018-01-31 LAB — VITAMIN B12: Vitamin B-12: 501 pg/mL (ref 232–1245)

## 2018-02-03 ENCOUNTER — Telehealth: Payer: Self-pay | Admitting: Internal Medicine

## 2018-02-03 NOTE — Telephone Encounter (Signed)
Needs to call family pharmacy in [place], HE WANTS MEDICATION FOR PATCHES TO STOP SMOKING, INSURANCE WOULD NOT PAY FOR INHALER TO HELP STOP SMOKING

## 2018-02-04 ENCOUNTER — Encounter: Payer: Self-pay | Admitting: Cardiovascular Disease

## 2018-02-04 ENCOUNTER — Ambulatory Visit (INDEPENDENT_AMBULATORY_CARE_PROVIDER_SITE_OTHER): Payer: Medicare HMO | Admitting: Cardiovascular Disease

## 2018-02-04 VITALS — BP 132/82 | HR 84 | Ht 69.0 in | Wt 195.0 lb

## 2018-02-04 DIAGNOSIS — I739 Peripheral vascular disease, unspecified: Secondary | ICD-10-CM

## 2018-02-04 DIAGNOSIS — E785 Hyperlipidemia, unspecified: Secondary | ICD-10-CM | POA: Diagnosis not present

## 2018-02-04 DIAGNOSIS — Z8679 Personal history of other diseases of the circulatory system: Secondary | ICD-10-CM

## 2018-02-04 DIAGNOSIS — E1169 Type 2 diabetes mellitus with other specified complication: Secondary | ICD-10-CM

## 2018-02-04 NOTE — Assessment & Plan Note (Signed)
History of essential hypertension blood pressure measured at 132/82. He is on lisinopril. Continue current meds at current dosing.

## 2018-02-04 NOTE — Progress Notes (Signed)
02/04/2018 Donald Palmer   12/20/1960  [id number]  Primary Physician Donald Crews, MD Primary Cardiologist: Donald Harp MD Donald Palmer, [place]  HPI:  Donald Palmer is a 57 y.o. single African-American male father of 79, grandfather of 37 grandchildren referred by Dr. Lynnae Palmer for cardiovascular vascular evaluation because of symptomatic left calf claudication. He has a history of 40-pack-years of tobacco as well as treated hypertension, diabetes and hyperlipidemia. He has never had a heart attack or stroke. He denies chest pain or shortness of breath. He is disabled because of his back. He is complaining of left calf claudication with segmental pressures to suggest PAD.   Current Meds  Medication Sig  . aspirin EC 81 MG tablet Take 1 tablet (81 mg total) by mouth daily.  . baclofen (LIORESAL) 10 MG tablet Take 1 tablet (10 mg total) by mouth every 8 (eight) hours as needed for muscle spasms.  . Blood Glucose Monitoring Suppl (ACCU-CHEK AVIVA PLUS) W/DEVICE KIT 1 each by Does not apply route 2 (two) times daily. To check blood sugar twice daily. diag code E11.40. Non insulin dependent. Please check with pt that this is the equipment he needs.  . celecoxib (CELEBREX) 200 MG capsule Take 1 capsule (200 mg total) by mouth daily.  . cholecalciferol (VITAMIN D) 1000 units tablet Take 1 tablet (1,000 Units total) by mouth daily.  . cilostazol (PLETAL) 100 MG tablet Take 1 tablet (100 mg total) by mouth 2 (two) times daily.  . clotrimazole-betamethasone (LOTRISONE) cream Apply 1 application topically 2 (two) times daily.  . cyclobenzaprine (FLEXERIL) 10 MG tablet Take 1 tablet (10 mg total) by mouth at bedtime as needed for up to 15 doses for muscle spasms.  . fluticasone (FLONASE) 50 MCG/ACT nasal spray Place 2 sprays into both nostrils daily. 120/4=30  . glipiZIDE (GLUCOTROL XL) 2.5 MG 24 hr tablet Take 1 tablet (2.5 mg total) by mouth daily with breakfast.  . glucose blood  (ACCU-CHEK AVIVA PLUS) test strip Check blood sugar one time a day as instructed (max check 2)  . Lancet Devices MISC 1 each by Does not apply route 2 (two) times daily. Please use to check blood sugar 2 times daily. diag code E11.40. noninsulin dependent  . lisinopril (PRINIVIL,ZESTRIL) 20 MG tablet Take 1 tablet (20 mg total) by mouth daily.  Marland Kitchen loratadine (CLARITIN) 10 MG tablet Take 1 tablet (10 mg total) by mouth daily.  . nicotine (NICOTROL) 10 MG inhaler For cravings, insert 1 cartridge into device, puff for up to 20 minutes. Use in place of cigarette, maximum 16 cartridges per day  . omeprazole (PRILOSEC) 20 MG capsule Take 1 capsule (20 mg total) by mouth daily.  . pioglitazone (ACTOS) 45 MG tablet Take 1 tablet (45 mg total) by mouth daily.  . pregabalin (LYRICA) 25 MG capsule Take 1 capsule (25 mg total) by mouth 3 (three) times daily.  . simvastatin (ZOCOR) 40 MG tablet Take 1 tablet (40 mg total) by mouth every evening.     Allergies  Allergen Reactions  . Metformin And Related Other (See Comments)    GI side effects. Stopped 2016    Social History   Socioeconomic History  . Marital status: Single    Spouse name: Not on file  . Number of children: Not on file  . Years of education: Not on file  . Highest education level: Not on file  Social Needs  . Financial resource strain: Not on file  .  Food insecurity - worry: Not on file  . Food insecurity - inability: Not on file  . Transportation needs - medical: Not on file  . Transportation needs - non-medical: Not on file  Occupational History  . Not on file  Tobacco Use  . Smoking status: Current Some Day Smoker    Packs/day: 0.10    Types: Cigarettes, Cigars  . Smokeless tobacco: Never Used  . Tobacco comment: 3cigs/day.  Substance and Sexual Activity  . Alcohol use: Yes    Alcohol/week: 3.0 oz    Types: 5 Cans of beer per week    Comment: Beer.  . Drug use: Yes    Frequency: 1.0 times per week    Types:  Marijuana    Comment: rarely  . Sexual activity: Yes  Other Topics Concern  . Not on file  Social History Narrative   Prior incarceration. Tattoos, inc ones obtained in prison.    On disability since 09/2013     Review of Systems: General: negative for chills, fever, night sweats or weight changes.  Cardiovascular: negative for chest pain, dyspnea on exertion, edema, orthopnea, palpitations, paroxysmal nocturnal dyspnea or shortness of breath Dermatological: negative for rash Respiratory: negative for cough or wheezing Urologic: negative for hematuria Abdominal: negative for nausea, vomiting, diarrhea, bright red blood per rectum, melena, or hematemesis Neurologic: negative for visual changes, syncope, or dizziness All other systems reviewed and are otherwise negative except as noted above.    Blood pressure 132/82, pulse 84, height 5' 9" (1.753 m), weight 195 lb (88.5 kg).  General appearance: alert and no distress Neck: no adenopathy, no carotid bruit, no JVD, supple, symmetrical, trachea midline and thyroid not enlarged, symmetric, no tenderness/mass/nodules Lungs: clear to auscultation bilaterally Heart: regular rate and rhythm, S1, S2 normal, no murmur, click, rub or gallop Extremities: extremities normal, atraumatic, no cyanosis or edema Pulses: 2+ and symmetric Skin: Skin color, texture, turgor normal. No rashes or lesions Neurologic: Alert and oriented X 3, normal strength and tone. Normal symmetric reflexes. Normal coordination and gait  EKG sinus rhythm 84 with voltage criteria for LVH. I personally reviewed this EKG.  ASSESSMENT AND PLAN:   PAD (peripheral artery disease) [facility]) Donald Palmer was referred to me by Dr. Lynnae Palmer for evaluation of symptomatic left calf claudication. He had segmental pressures performed on 01/03/18 that showed the possibility of severe PAD. He's had pain in his left calf preventing him from ambulating. We will get lower extremity arterial Doppler  studies and based on these results we'll decide whether to proceed with angiography.  History of hypertension History of essential hypertension blood pressure measured at 132/82. He is on lisinopril. Continue current meds at current dosing.  Hyperlipidemia associated with type 2 diabetes mellitus ([place]) History of hyperlipidemia on statin therapy with recent lipid profile performed 05/30/17 revealing an LDL of 97 and HDL of 49.      Donald Harp MD FACP,FACC,FAHA, [facility] 02/04/2018 2:48 PM

## 2018-02-04 NOTE — Telephone Encounter (Signed)
Only Medicaid would cover the patches. I can try to meet with patient to provide samples---can you please schedule pt appointment with me?  Dr. Lynnae January, he can likely quit with something other than patches. I can discuss options with him at appointment.  Thank you!

## 2018-02-04 NOTE — Assessment & Plan Note (Signed)
[name] was referred to me by Dr. Lynnae January for evaluation of symptomatic left calf claudication. He had segmental pressures performed on 01/03/18 that showed the possibility of severe PAD. He's had pain in his left calf preventing him from ambulating. We will get lower extremity arterial Doppler studies and based on these results we'll decide whether to proceed with angiography.

## 2018-02-04 NOTE — Telephone Encounter (Signed)
Thanks

## 2018-02-04 NOTE — Telephone Encounter (Signed)
Dr Maudie Mercury He is only smoking 6 or 7 cig per day so I am hesitant to start even the lowest patch. Will his insurance cover anything else? thanks

## 2018-02-04 NOTE — Patient Instructions (Addendum)
  Medication Instructions: Your physician recommends that you continue on your current medications as directed. Please refer to the Current Medication list given to you today.   Testing/Procedures: Your physician has requested that you have a lower extremity arterial duplex. During this test, ultrasound is used to evaluate arterial blood flow in the legs. Allow one hour for this exam. There are no restrictions or special instructions.  Your physician has requested that you have an ankle brachial index (ABI). During this test an ultrasound and blood pressure cuff are used to evaluate the arteries that supply the arms and legs with blood. Allow thirty minutes for this exam. There are no restrictions or special instructions.  Follow-Up: We will call you with the results of your tests to let you know about further follow-up.

## 2018-02-04 NOTE — Assessment & Plan Note (Signed)
History of hyperlipidemia on statin therapy with recent lipid profile performed 05/30/17 revealing an LDL of 97 and HDL of 49.

## 2018-02-05 ENCOUNTER — Encounter: Payer: Self-pay | Admitting: Cardiovascular Disease

## 2018-02-05 ENCOUNTER — Other Ambulatory Visit: Payer: Self-pay | Admitting: Pharmacist

## 2018-02-05 DIAGNOSIS — Z716 Tobacco abuse counseling: Secondary | ICD-10-CM

## 2018-02-05 MED ORDER — NICOTINE POLACRILEX 2 MG MT LOZG: LOZENGE | OROMUCOSAL | 3 refills | Status: DC

## 2018-02-05 NOTE — Telephone Encounter (Signed)
Please disregard, spoke to patient and he is interested in lozenge. Sent Rx, advised patient to schedule appointment if further help or samples needed. Thank you!

## 2018-02-07 ENCOUNTER — Ambulatory Visit (HOSPITAL_COMMUNITY)
Admission: RE | Admit: 2018-02-07 | Discharge: 2018-02-07 | Disposition: A | Discharge status: Home / Self Care | Payer: Medicare HMO | Source: Ambulatory Visit | Attending: Cardiology | Admitting: Cardiology

## 2018-02-07 DIAGNOSIS — I739 Peripheral vascular disease, unspecified: Secondary | ICD-10-CM | POA: Diagnosis not present

## 2018-02-07 DIAGNOSIS — I771 Stricture of artery: Secondary | ICD-10-CM | POA: Insufficient documentation

## 2018-02-07 DIAGNOSIS — R9389 Abnormal findings on diagnostic imaging of other specified body structures: Secondary | ICD-10-CM | POA: Diagnosis not present

## 2018-02-07 DIAGNOSIS — E785 Hyperlipidemia, unspecified: Secondary | ICD-10-CM | POA: Diagnosis not present

## 2018-02-07 DIAGNOSIS — E1151 Type 2 diabetes mellitus with diabetic peripheral angiopathy without gangrene: Secondary | ICD-10-CM | POA: Insufficient documentation

## 2018-02-07 DIAGNOSIS — I70202 Unspecified atherosclerosis of native arteries of extremities, left leg: Secondary | ICD-10-CM | POA: Insufficient documentation

## 2018-02-07 DIAGNOSIS — I10 Essential (primary) hypertension: Secondary | ICD-10-CM | POA: Insufficient documentation

## 2018-02-10 DIAGNOSIS — G609 Hereditary and idiopathic neuropathy, unspecified: Secondary | ICD-10-CM | POA: Diagnosis not present

## 2018-02-10 DIAGNOSIS — B351 Tinea unguium: Secondary | ICD-10-CM | POA: Diagnosis not present

## 2018-02-10 DIAGNOSIS — M7989 Other specified soft tissue disorders: Secondary | ICD-10-CM | POA: Diagnosis not present

## 2018-02-11 ENCOUNTER — Ambulatory Visit (HOSPITAL_COMMUNITY)
Admission: RE | Admit: 2018-02-11 | Discharge: 2018-02-11 | Disposition: A | Discharge status: Home / Self Care | Payer: Medicare HMO | Source: Ambulatory Visit | Attending: Internal Medicine | Admitting: Internal Medicine

## 2018-02-11 DIAGNOSIS — H524 Presbyopia: Secondary | ICD-10-CM | POA: Diagnosis not present

## 2018-02-11 DIAGNOSIS — G894 Chronic pain syndrome: Secondary | ICD-10-CM | POA: Diagnosis not present

## 2018-02-11 DIAGNOSIS — M5126 Other intervertebral disc displacement, lumbar region: Secondary | ICD-10-CM | POA: Insufficient documentation

## 2018-02-11 DIAGNOSIS — H52209 Unspecified astigmatism, unspecified eye: Secondary | ICD-10-CM | POA: Diagnosis not present

## 2018-02-11 DIAGNOSIS — M549 Dorsalgia, unspecified: Secondary | ICD-10-CM | POA: Insufficient documentation

## 2018-02-11 DIAGNOSIS — H5213 Myopia, bilateral: Secondary | ICD-10-CM | POA: Diagnosis not present

## 2018-02-11 DIAGNOSIS — M5127 Other intervertebral disc displacement, lumbosacral region: Secondary | ICD-10-CM | POA: Diagnosis not present

## 2018-02-13 ENCOUNTER — Ambulatory Visit (INDEPENDENT_AMBULATORY_CARE_PROVIDER_SITE_OTHER): Payer: Medicare HMO | Admitting: Internal Medicine

## 2018-02-13 DIAGNOSIS — E1140 Type 2 diabetes mellitus with diabetic neuropathy, unspecified: Secondary | ICD-10-CM | POA: Diagnosis not present

## 2018-02-13 DIAGNOSIS — Z7982 Long term (current) use of aspirin: Secondary | ICD-10-CM | POA: Diagnosis not present

## 2018-02-13 DIAGNOSIS — I739 Peripheral vascular disease, unspecified: Secondary | ICD-10-CM

## 2018-02-13 DIAGNOSIS — R269 Unspecified abnormalities of gait and mobility: Secondary | ICD-10-CM | POA: Diagnosis not present

## 2018-02-13 DIAGNOSIS — G894 Chronic pain syndrome: Secondary | ICD-10-CM | POA: Diagnosis not present

## 2018-02-13 DIAGNOSIS — F17210 Nicotine dependence, cigarettes, uncomplicated: Secondary | ICD-10-CM

## 2018-02-13 DIAGNOSIS — E1151 Type 2 diabetes mellitus with diabetic peripheral angiopathy without gangrene: Secondary | ICD-10-CM

## 2018-02-13 DIAGNOSIS — Z79899 Other long term (current) drug therapy: Secondary | ICD-10-CM | POA: Diagnosis not present

## 2018-02-13 NOTE — Progress Notes (Signed)
   Subjective:    Patient ID: AMDREW OBOYLE, male    DOB: [birth year], 57 y.o.   MRN: [id number]  HPI  KIEGAN MACARAEG is here for PAD F/U. Please see the A&P for the status of the pt's chronic medical problems.  ROS : per ROS section and in problem oriented charting. All other systems are negative.  PMHx, Soc hx, and / or Fam hx : Still living with girlfriend. On 4 cigarettes per week.  Review of Systems  Musculoskeletal: Positive for gait problem and myalgias.  Skin: Positive for wound.  Neurological:       + le NEUROPATHY       Objective:   Physical Exam  Constitutional: He appears well-developed and well-nourished. No distress.  HENT:  Head: Normocephalic and atraumatic.  Right Ear: External ear normal.  Left Ear: External ear normal.  Nose: Nose normal.  Eyes: Conjunctivae and EOM are normal. Right eye exhibits no discharge. Left eye exhibits no discharge. No scleral icterus.  Musculoskeletal: He exhibits no edema, tenderness or deformity.  Neurological: He is alert.  Skin: Skin is warm and dry. He is not diaphoretic.  Well healed skin bx Inf L lateral malleolus   Psychiatric: He has a normal mood and affect. His behavior is normal. Judgment and thought content normal.  Hyper, pressured speech  L foot warm, good cap refill, no palpable DP puilse     Assessment & Plan:

## 2018-02-13 NOTE — Patient Instructions (Addendum)
1. Take a baby aspirin (81mg ) daily 2. You have decreased blow flow to your left leg that is causing you pain 3. You must see Dr Gwenlyn Found ASAP - MARCH 6 10:45 4. Stop smoking!! This is very important 5. See me in 4 weeks to follow up on your leg

## 2018-02-14 NOTE — Assessment & Plan Note (Signed)
This problem is chronic and uncontrolled. Art dopplers on the 22nd showed occluded pop artery. He is symptomatic. Still smoking, 4 cig per day, although trying to quit. Has not gotten the nicotine replacement therapy (lozenges). Was not aware he was to take ASA although I and other MD's have told him and on AVS. Again instructed to take ASA 81 mg QD and to stop smoking. Gave him a print out of the arteries in LE and where th blockage is. Explained he likely has both PAD and neuropathy +/- LBP. We need to fix PAD first and then reassess pain.  No appt with Dr Gwenlyn Found. Nurse Ulis Rias got him sch on the 6th.   PLAN : F/U Dr Gwenlyn Found ASA 81 mg Stop tobacco Walk Pletal Statin

## 2018-02-14 NOTE — Assessment & Plan Note (Signed)
His neuropathy is chronic and uncontrolled. He changed from gaba to lyrica & thinks it is working better. Request records from podiatry.   PLAN:  Cont current meds

## 2018-02-14 NOTE — Assessment & Plan Note (Signed)
He again asked for an aide. His "girl" with whom he lives, is an Engineer, production. He states due to all of the pain in his left leg that he needs help getting up, dressing, bathing, and walking. He was able to put her into his tires prior to coming to his appointment, he was able to drive independently to the clinic, he was able to walk independently from the parking lot into the clinic, and he was able to walk independently from the clinic back to his car. I explained that because his left leg pain exacerbation is temporarily and will be relieved once he gets better arterial flow into that left leg, any impediments he has in his ADLs are temporary and an aide is not required.

## 2018-02-19 ENCOUNTER — Encounter: Payer: Self-pay | Admitting: Cardiovascular Disease

## 2018-02-19 ENCOUNTER — Ambulatory Visit (INDEPENDENT_AMBULATORY_CARE_PROVIDER_SITE_OTHER): Payer: Medicare HMO | Admitting: Cardiovascular Disease

## 2018-02-19 VITALS — BP 138/86 | HR 87 | Ht 69.0 in | Wt 197.0 lb

## 2018-02-19 DIAGNOSIS — E1169 Type 2 diabetes mellitus with other specified complication: Secondary | ICD-10-CM | POA: Diagnosis not present

## 2018-02-19 DIAGNOSIS — Z0000 Encounter for general adult medical examination without abnormal findings: Secondary | ICD-10-CM | POA: Diagnosis not present

## 2018-02-19 DIAGNOSIS — I739 Peripheral vascular disease, unspecified: Secondary | ICD-10-CM | POA: Diagnosis not present

## 2018-02-19 DIAGNOSIS — I700 Atherosclerosis of aorta: Secondary | ICD-10-CM | POA: Diagnosis not present

## 2018-02-19 NOTE — H&P (View-Only) (Signed)
Mr. Pevehouse returns today for follow-up of his Doppler studies performed on 02/07/18 and evaluation of left calf claudication. Left ABI was 0.66. He did have an occluded left popliteal artery. He is very symptomatic. We'll plan on performing angiography and potential endovascular therapy for [name] including claudication the week after next.  Lorretta Harp, M.D., [place], [facility], [place] [address] [place], [place]  [address]  [phone] 02/19/2018 11:33 AM

## 2018-02-19 NOTE — Patient Instructions (Addendum)
   [place] [address] [place] [address] Dept: [phone] Loc: [place]  02/19/2018  You are scheduled for a Peripheral Angiogram on Monday, March 18 with Dr. Quay Burow.  1. Please arrive at the [facility] (Main Entrance A) at [facility]: [address] [place], [place] [address] at 7:30 AM (two hours before your procedure to ensure your preparation). Free valet parking service is available.   Special note: Every effort is made to have your procedure done on time. Please understand that emergencies sometimes delay scheduled procedures.  2. Diet: Do not eat or drink anything after midnight prior to your procedure except sips of water to take medications.  3. Labs: Please have labs done today in our office.  4. Medication instructions in preparation for your procedure:  Do not take any diabetic medications on the morning of your procedure.  On the morning of your procedure, take your Aspirin and any morning medicines NOT listed above.  You may use sips of water.  5. Plan for one night stay--bring personal belongings. 6. Bring a current list of your medications and current insurance cards. 7. You MUST have a responsible person to drive you home. 8. Someone MUST be with you the first 24 hours after you arrive home or your discharge will be delayed. 9. Please wear clothes that are easy to get on and off and wear slip-on shoes.  Thank you for allowing [place] to care for you!   -- [facility]   Post-procedure Follow-up:  1 WEEK AFTER ANGIO: Your physician has requested that you have a lower extremity arterial duplex. During this test, ultrasound is used to evaluate arterial blood flow in the legs. Allow one hour for this exam. There are no restrictions or special instructions.  Your physician has requested that you have an ankle  brachial index (ABI). During this test an ultrasound and blood pressure cuff are used to evaluate the arteries that supply the arms and legs with blood. Allow thirty minutes for this exam. There are no restrictions or special instructions.  Your physician recommends that you schedule a follow-up appointment 2 WEEKS AFTER ANGIO.

## 2018-02-19 NOTE — Progress Notes (Signed)
Mr. Salahuddin returns today for follow-up of his Doppler studies performed on 02/07/18 and evaluation of left calf claudication. Left ABI was 0.66. He did have an occluded left popliteal artery. He is very symptomatic. We'll plan on performing angiography and potential endovascular therapy for [name] including claudication the week after next.  Lorretta Harp, M.D., [place], [facility], [place] [address] [place],   [address]  [phone] 02/19/2018 11:33 AM

## 2018-02-19 NOTE — Assessment & Plan Note (Signed)
Donald Palmer returns today for follow-up of his Doppler studies performed on 02/07/18 and evaluation of left calf claudication. Left ABI was 0.66. He did have an occluded left popliteal artery. He is very symptomatic. We'll plan on performing angiography and potential endovascular therapy for [name] including claudication the week after next.

## 2018-02-20 LAB — PROTIME-INR
INR: 1 (ref 0.8–1.2)
Prothrombin Time: 10.3 s (ref 9.1–12.0)

## 2018-02-20 LAB — BASIC METABOLIC PANEL
BUN/Creatinine Ratio: 8 — ABNORMAL LOW (ref 9–20)
BUN: 10 mg/dL (ref 6–24)
CALCIUM: 10 mg/dL (ref 8.7–10.2)
CHLORIDE: 100 mmol/L (ref 96–106)
CO2: 25 mmol/L (ref 20–29)
Creatinine, Ser: 1.26 mg/dL (ref 0.76–1.27)
GFR calc Af Amer: 73 mL/min/{1.73_m2} (ref >59)
GFR calc non Af Amer: 63 mL/min/{1.73_m2} (ref >59)
Glucose: 155 mg/dL — ABNORMAL HIGH (ref 65–99)
Potassium: 4.7 mmol/L (ref 3.5–5.2)
Sodium: 140 mmol/L (ref 134–144)

## 2018-02-20 LAB — CBC WITH DIFFERENTIAL/PLATELET
BASOS ABS: 0.1 10*3/uL (ref 0.0–0.2)
Basos: 1 %
EOS (ABSOLUTE): 0.1 10*3/uL (ref 0.0–0.4)
Eos: 1 %
Hematocrit: 42.6 % (ref 37.5–51.0)
Hemoglobin: 13.6 g/dL (ref 13.0–17.7)
IMMATURE GRANS (ABS): 0 10*3/uL (ref 0.0–0.1)
IMMATURE GRANULOCYTES: 0 %
LYMPHS: 46 %
Lymphocytes Absolute: 3 10*3/uL (ref 0.7–3.1)
MCH: 22.7 pg — ABNORMAL LOW (ref 26.6–33.0)
MCHC: 31.9 g/dL (ref 31.5–35.7)
MCV: 71 fL — ABNORMAL LOW (ref 79–97)
Monocytes Absolute: 0.6 10*3/uL (ref 0.1–0.9)
Monocytes: 9 %
NEUTROS ABS: 2.8 10*3/uL (ref 1.4–7.0)
NEUTROS PCT: 43 %
PLATELETS: 262 10*3/uL (ref 150–379)
RBC: 6 x10E6/uL — ABNORMAL HIGH (ref 4.14–5.80)
RDW: 16.8 % — AB (ref 12.3–15.4)
WBC: 6.5 10*3/uL (ref 3.4–10.8)

## 2018-02-20 LAB — APTT: APTT: 28 s (ref 24–33)

## 2018-02-20 LAB — TSH: TSH: 1.19 u[IU]/mL (ref 0.450–4.500)

## 2018-02-24 ENCOUNTER — Encounter: Payer: Self-pay | Admitting: Cardiovascular Disease

## 2018-02-25 ENCOUNTER — Telehealth: Payer: Self-pay | Admitting: Internal Medicine

## 2018-02-25 NOTE — Telephone Encounter (Signed)
Stated he called had meds transferred fro [facility] to [facility].

## 2018-02-25 NOTE — Telephone Encounter (Signed)
Patent want all his medicine called to [place]

## 2018-02-26 ENCOUNTER — Other Ambulatory Visit: Payer: Self-pay | Admitting: Cardiovascular Disease

## 2018-02-26 DIAGNOSIS — I739 Peripheral vascular disease, unspecified: Secondary | ICD-10-CM

## 2018-03-03 ENCOUNTER — Other Ambulatory Visit: Payer: Self-pay

## 2018-03-03 ENCOUNTER — Encounter (HOSPITAL_COMMUNITY): Payer: Self-pay | Admitting: Cardiovascular Disease

## 2018-03-03 ENCOUNTER — Ambulatory Visit
Admission: RE | Admit: 2018-03-03 | Discharge: 2018-03-03 | Disposition: A | Discharge status: Home / Self Care | Payer: Medicare HMO | Source: Ambulatory Visit | Attending: Cardiovascular Disease | Admitting: Cardiovascular Disease

## 2018-03-03 ENCOUNTER — Ambulatory Visit (HOSPITAL_COMMUNITY)
Admission: RE | Disposition: A | Discharge status: Home / Self Care | Payer: Self-pay | Source: Ambulatory Visit | Attending: Cardiovascular Disease

## 2018-03-03 ENCOUNTER — Ambulatory Visit (HOSPITAL_COMMUNITY)
Admission: RE | Admit: 2018-03-03 | Discharge: 2018-03-04 | Disposition: A | Discharge status: Home / Self Care | Payer: Medicare HMO | Source: Ambulatory Visit | Attending: Cardiovascular Disease | Admitting: Cardiovascular Disease

## 2018-03-03 DIAGNOSIS — Z888 Allergy status to other drugs, medicaments and biological substances status: Secondary | ICD-10-CM | POA: Insufficient documentation

## 2018-03-03 DIAGNOSIS — Z7902 Long term (current) use of antithrombotics/antiplatelets: Secondary | ICD-10-CM | POA: Insufficient documentation

## 2018-03-03 DIAGNOSIS — I10 Essential (primary) hypertension: Secondary | ICD-10-CM | POA: Insufficient documentation

## 2018-03-03 DIAGNOSIS — Z79899 Other long term (current) drug therapy: Secondary | ICD-10-CM | POA: Diagnosis not present

## 2018-03-03 DIAGNOSIS — I739 Peripheral vascular disease, unspecified: Secondary | ICD-10-CM | POA: Diagnosis not present

## 2018-03-03 DIAGNOSIS — Z7984 Long term (current) use of oral hypoglycemic drugs: Secondary | ICD-10-CM | POA: Diagnosis not present

## 2018-03-03 DIAGNOSIS — I70212 Atherosclerosis of native arteries of extremities with intermittent claudication, left leg: Secondary | ICD-10-CM | POA: Insufficient documentation

## 2018-03-03 DIAGNOSIS — E785 Hyperlipidemia, unspecified: Secondary | ICD-10-CM | POA: Diagnosis not present

## 2018-03-03 DIAGNOSIS — Z7982 Long term (current) use of aspirin: Secondary | ICD-10-CM | POA: Diagnosis not present

## 2018-03-03 DIAGNOSIS — E119 Type 2 diabetes mellitus without complications: Secondary | ICD-10-CM | POA: Diagnosis not present

## 2018-03-03 DIAGNOSIS — E1151 Type 2 diabetes mellitus with diabetic peripheral angiopathy without gangrene: Secondary | ICD-10-CM | POA: Diagnosis not present

## 2018-03-03 DIAGNOSIS — F17210 Nicotine dependence, cigarettes, uncomplicated: Secondary | ICD-10-CM | POA: Insufficient documentation

## 2018-03-03 DIAGNOSIS — I7092 Chronic total occlusion of artery of the extremities: Secondary | ICD-10-CM | POA: Diagnosis not present

## 2018-03-03 HISTORY — PX: PERIPHERAL VASCULAR ATHERECTOMY: CATH118256

## 2018-03-03 HISTORY — PX: ABDOMINAL AORTOGRAM: CATH118222

## 2018-03-03 HISTORY — DX: Peripheral vascular disease, unspecified: I73.9

## 2018-03-03 HISTORY — PX: LOWER EXTREMITY INTERVENTION: CATH118252

## 2018-03-03 LAB — GLUCOSE, CAPILLARY
GLUCOSE-CAPILLARY: 220 mg/dL — AB (ref 65–99)
Glucose-Capillary: 190 mg/dL — ABNORMAL HIGH (ref 65–99)
Glucose-Capillary: 97 mg/dL (ref 65–99)
Glucose-Capillary: 178 mg/dL — ABNORMAL HIGH (ref 65–99)

## 2018-03-03 LAB — CBC
HCT: 37.5 % — ABNORMAL LOW (ref 39.0–52.0)
HEMOGLOBIN: 11.7 g/dL — AB (ref 13.0–17.0)
MCH: 22.7 pg — ABNORMAL LOW (ref 26.0–34.0)
MCHC: 31.2 g/dL (ref 30.0–36.0)
MCV: 72.8 fL — ABNORMAL LOW (ref 78.0–100.0)
Platelets: 213 10*3/uL (ref 150–400)
RBC: 5.15 MIL/uL (ref 4.22–5.81)
RDW: 15.3 % (ref 11.5–15.5)
WBC: 5.4 10*3/uL (ref 4.0–10.5)

## 2018-03-03 LAB — POCT ACTIVATED CLOTTING TIME
ACTIVATED CLOTTING TIME: 164 s
Activated Clotting Time: 252 seconds
Activated Clotting Time: 202 seconds
Activated Clotting Time: 224 seconds

## 2018-03-03 SURGERY — LOWER EXTREMITY INTERVENTION
Anesthesia: LOCAL

## 2018-03-03 MED ORDER — ONDANSETRON HCL 4 MG/2ML IJ SOLN: 4.0000 mg | Freq: Four times a day (QID) | INTRAMUSCULAR | Status: DC | PRN

## 2018-03-03 MED ORDER — FENTANYL CITRATE (PF) 100 MCG/2ML IJ SOLN
INTRAMUSCULAR | Status: AC
  Filled 2018-03-03: qty 2

## 2018-03-03 MED ORDER — HEPARIN SODIUM (PORCINE) 1000 UNIT/ML IJ SOLN
INTRAMUSCULAR | Status: AC
  Filled 2018-03-03: qty 1

## 2018-03-03 MED ORDER — HEPARIN (PORCINE) IN NACL 2-0.9 UNIT/ML-% IJ SOLN
INTRAMUSCULAR | Status: AC
  Filled 2018-03-03: qty 1000

## 2018-03-03 MED ORDER — TADALAFIL 5 MG PO TABS
10.0000 mg | ORAL_TABLET | ORAL | Status: DC | PRN
  Filled 2018-03-03: qty 2

## 2018-03-03 MED ORDER — SIMVASTATIN 20 MG PO TABS: 40.0000 mg | ORAL_TABLET | Freq: Every evening | ORAL | Status: DC

## 2018-03-03 MED ORDER — SODIUM CHLORIDE 0.9% FLUSH: 3.0000 mL | INTRAVENOUS | Status: DC | PRN

## 2018-03-03 MED ORDER — CLOPIDOGREL BISULFATE 300 MG PO TABS
ORAL_TABLET | ORAL | Status: AC
  Filled 2018-03-03: qty 1

## 2018-03-03 MED ORDER — HEPARIN SODIUM (PORCINE) 1000 UNIT/ML IJ SOLN
INTRAMUSCULAR | Status: DC | PRN
  Administered 2018-03-03: 8000 [IU] via INTRAVENOUS
  Administered 2018-03-03: 3000 [IU] via INTRAVENOUS

## 2018-03-03 MED ORDER — CLOPIDOGREL BISULFATE 75 MG PO TABS
75.0000 mg | ORAL_TABLET | Freq: Every day | ORAL | Status: DC
  Administered 2018-03-04: 75 mg via ORAL
  Filled 2018-03-03: qty 1

## 2018-03-03 MED ORDER — HEPARIN (PORCINE) IN NACL 2-0.9 UNIT/ML-% IJ SOLN
INTRAMUSCULAR | Status: AC | PRN
  Administered 2018-03-03 (×2): 500 mL

## 2018-03-03 MED ORDER — ANGIOPLASTY BOOK
Freq: Once | Status: AC
  Administered 2018-03-04: 1
  Filled 2018-03-03: qty 1

## 2018-03-03 MED ORDER — LIDOCAINE HCL (PF) 1 % IJ SOLN
INTRAMUSCULAR | Status: DC | PRN
  Administered 2018-03-03: 38 mL

## 2018-03-03 MED ORDER — ASPIRIN 81 MG PO CHEW
CHEWABLE_TABLET | ORAL | Status: AC
  Administered 2018-03-03: 81 mg via ORAL
  Filled 2018-03-03: qty 1

## 2018-03-03 MED ORDER — ACETAMINOPHEN 325 MG PO TABS
650.0000 mg | ORAL_TABLET | ORAL | Status: DC | PRN
  Administered 2018-03-03 – 2018-03-04 (×2): 650 mg via ORAL
  Filled 2018-03-03 (×2): qty 2

## 2018-03-03 MED ORDER — ATORVASTATIN CALCIUM 80 MG PO TABS
80.0000 mg | ORAL_TABLET | Freq: Every day | ORAL | Status: DC
  Administered 2018-03-03: 19:00:00 80 mg via ORAL
  Filled 2018-03-03: qty 1

## 2018-03-03 MED ORDER — INSULIN ASPART 100 UNIT/ML ~~LOC~~ SOLN
0.0000 [IU] | Freq: Three times a day (TID) | SUBCUTANEOUS | Status: DC
  Administered 2018-03-04: 07:00:00 5 [IU] via SUBCUTANEOUS

## 2018-03-03 MED ORDER — SODIUM CHLORIDE 0.9 % IV SOLN: INTRAVENOUS | Status: AC

## 2018-03-03 MED ORDER — MORPHINE SULFATE (PF) 2 MG/ML IV SOLN: 2.0000 mg | INTRAVENOUS | Status: DC | PRN

## 2018-03-03 MED ORDER — PANTOPRAZOLE SODIUM 40 MG PO TBEC
40.0000 mg | DELAYED_RELEASE_TABLET | Freq: Every day | ORAL | Status: DC
  Administered 2018-03-03 – 2018-03-04 (×2): 40 mg via ORAL
  Filled 2018-03-03 (×2): qty 1

## 2018-03-03 MED ORDER — LABETALOL HCL 5 MG/ML IV SOLN: 10.0000 mg | INTRAVENOUS | Status: DC | PRN

## 2018-03-03 MED ORDER — HYDRALAZINE HCL 20 MG/ML IJ SOLN: 5.0000 mg | INTRAMUSCULAR | Status: DC | PRN

## 2018-03-03 MED ORDER — ASPIRIN 81 MG PO CHEW
81.0000 mg | CHEWABLE_TABLET | ORAL | Status: AC
  Administered 2018-03-03: 81 mg via ORAL

## 2018-03-03 MED ORDER — ASPIRIN EC 81 MG PO TBEC: 81.0000 mg | DELAYED_RELEASE_TABLET | Freq: Every day | ORAL | Status: DC

## 2018-03-03 MED ORDER — SODIUM CHLORIDE 0.9 % WEIGHT BASED INFUSION
3.0000 mL/kg/h | INTRAVENOUS | Status: DC
  Administered 2018-03-03: 3 mL/kg/h via INTRAVENOUS

## 2018-03-03 MED ORDER — SODIUM CHLORIDE 0.9 % WEIGHT BASED INFUSION: 1.0000 mL/kg/h | INTRAVENOUS | Status: DC

## 2018-03-03 MED ORDER — LISINOPRIL 10 MG PO TABS
20.0000 mg | ORAL_TABLET | Freq: Every day | ORAL | Status: DC
  Administered 2018-03-03 – 2018-03-04 (×2): 20 mg via ORAL
  Filled 2018-03-03 (×2): qty 2

## 2018-03-03 MED ORDER — ATROPINE SULFATE 1 MG/10ML IJ SOSY
PREFILLED_SYRINGE | INTRAMUSCULAR | Status: AC
  Filled 2018-03-03: qty 10

## 2018-03-03 MED ORDER — FENTANYL CITRATE (PF) 100 MCG/2ML IJ SOLN
INTRAMUSCULAR | Status: DC | PRN
  Administered 2018-03-03 (×2): 25 ug via INTRAVENOUS

## 2018-03-03 MED ORDER — ASPIRIN EC 81 MG PO TBEC
81.0000 mg | DELAYED_RELEASE_TABLET | Freq: Every day | ORAL | Status: DC
  Administered 2018-03-04: 81 mg via ORAL
  Filled 2018-03-03: qty 1

## 2018-03-03 MED ORDER — CLOPIDOGREL BISULFATE 300 MG PO TABS
ORAL_TABLET | ORAL | Status: DC | PRN
  Administered 2018-03-03: 300 mg via ORAL

## 2018-03-03 MED ORDER — MIDAZOLAM HCL 2 MG/2ML IJ SOLN
INTRAMUSCULAR | Status: AC
  Filled 2018-03-03: qty 2

## 2018-03-03 MED ORDER — IODIXANOL 320 MG/ML IV SOLN
INTRAVENOUS | Status: DC | PRN
  Administered 2018-03-03: 150 mL via INTRAVENOUS

## 2018-03-03 MED ORDER — MIDAZOLAM HCL 2 MG/2ML IJ SOLN
INTRAMUSCULAR | Status: DC | PRN
  Administered 2018-03-03: 1 mg via INTRAVENOUS

## 2018-03-03 MED ORDER — INSULIN ASPART 100 UNIT/ML ~~LOC~~ SOLN
0.0000 [IU] | Freq: Every day | SUBCUTANEOUS | Status: DC
  Administered 2018-03-03: 23:00:00 2 [IU] via SUBCUTANEOUS

## 2018-03-03 MED ORDER — LIDOCAINE HCL 1 % IJ SOLN
INTRAMUSCULAR | Status: AC
  Filled 2018-03-03: qty 40

## 2018-03-03 MED ORDER — SODIUM CHLORIDE 0.9 % IV SOLN: 250.0000 mL | INTRAVENOUS | Status: DC | PRN

## 2018-03-03 MED ORDER — SODIUM CHLORIDE 0.9% FLUSH
3.0000 mL | Freq: Two times a day (BID) | INTRAVENOUS | Status: DC
  Administered 2018-03-03 (×2): 3 mL via INTRAVENOUS

## 2018-03-03 SURGICAL SUPPLY — 27 items
BALLN COYOTE ES OTW 2X40X144 (BALLOONS) ×4
BALLN IN.PACT DCB 4X120 (BALLOONS) ×4
BALLOON COYOTE ES OTW 2X40X144 (BALLOONS) ×3 IMPLANT
CATH ANGIO 5F PIGTAIL 65CM (CATHETERS) ×4 IMPLANT
CATH CROSS OVER TEMPO 5F (CATHETERS) ×4 IMPLANT
CATH HAWKONE LS STANDARD TIP (CATHETERS) ×4
CATH HAWKONE LS STD TIP (CATHETERS) ×3 IMPLANT
CATH NAVICROSS ANGLED 135CM (MICROCATHETER) ×4 IMPLANT
DCB IN.PACT 4X120 (BALLOONS) ×3 IMPLANT
DEVICE CONTINUOUS FLUSH (MISCELLANEOUS) ×4 IMPLANT
GUIDEWIRE LT ZIPWIRE 035X260 (WIRE) ×4 IMPLANT
KIT ENCORE 26 ADVANTAGE (KITS) ×4 IMPLANT
KIT PV (KITS) ×4 IMPLANT
SHEATH AVANTI 11CM 5FR (SHEATH) ×4 IMPLANT
SHEATH AVANTI 11CM 7FR (SHEATH) ×4 IMPLANT
SHEATH HIGHFLEX ANSEL 7FR 55CM (SHEATH) ×4 IMPLANT
STOPCOCK MORSE 400PSI 3WAY (MISCELLANEOUS) ×4 IMPLANT
SYRINGE MEDRAD AVANTA MACH 7 (SYRINGE) ×4 IMPLANT
TAPE VIPERTRACK RADIOPAQ (MISCELLANEOUS) ×3 IMPLANT
TAPE VIPERTRACK RADIOPAQUE (MISCELLANEOUS) ×1
TRANSDUCER W/STOPCOCK (MISCELLANEOUS) ×4 IMPLANT
TRAY PV CATH (CUSTOM PROCEDURE TRAY) ×4 IMPLANT
TUBING CIL FLEX 10 FLL-RA (TUBING) ×4 IMPLANT
WIRE AQUATRAK .035X150 ANG (WIRE) ×4 IMPLANT
WIRE HITORQ VERSACORE ST 145CM (WIRE) ×4 IMPLANT
WIRE ROSEN-J .035X180CM (WIRE) ×4 IMPLANT
WIRE SPARTACORE .014X300CM (WIRE) ×4 IMPLANT

## 2018-03-03 NOTE — Progress Notes (Signed)
Site area: right groin  Site Prior to Removal:  Level 0  Pressure Applied For 20 MINUTES    Minutes Beginning at 1357  Manual:   Yes.    Patient Status During Pull:  AAO X4  Post Pull Groin Site:  Level 0  Post Pull Instruction given: yes  Post Pull Pulses Present:  Yes.    Dressing Applied:  Yes.    Comments Tolerated procedure well

## 2018-03-03 NOTE — Discharge Summary (Signed)
Discharge Summary    [name],  MRN: [id number], DOB/AGE: [birth year] 57 y.o.  Admit date: 03/03/2018 Discharge date: 03/04/2018  Primary Care Provider: Larey Dresser A Primary Cardiologist: Dr. Gwenlyn Found   Discharge Diagnoses    Active Problems:   PAD (peripheral artery disease) ([place])   Claudication in peripheral vascular disease ([facility])   Allergies Allergies  Allergen Reactions  . Metformin And Related Other (See Comments)    GI side effects. Stopped 2016    Diagnostic Studies/Procedures    PV angiogram: 03/03/18  Final Impression: Successful Hawk 1 directional atherectomy followed by drug eluting balloon angioplasty of an occluded left popliteal artery resulting reduction of a total occlusion to 50th 60% stenosis in the P2 segment had a flexion point. I do not want to put in a stent at this time nor do I want to risk dissection. The sheath will be removed once the ACT falls below 1's 7 the pressure held. The patient will be treated with dual antiplatelet therapy. He'll be discharged on the morning. We will obtain lower extremity arterial Doppler studies in our [facility] line office next week and I will see him back 2-3 weeks thereafter.  Quay Burow. MD, [facility] _____________   History of Present Illness     57 y.o. who has a history of 40-pack-years of tobacco as well as treated hypertension, diabetes and hyperlipidemia. He has never had a heart attack or stroke. He denied chest pain or shortness of breath. He is disabled because of his back. He had complaining of left calf claudication with segmental pressures to suggest PAD .Doppler studies performed on 02/07/18 for evaluation of left calf claudication. Left ABI was 0.66. He did have an occluded left popliteal artery. He was very symptomatic. Given his findings he was set up for outpatient PV angiography.   Hospital Course     Underwent PV angiogram with Dr. Gwenlyn Found noted above with successful atherectomy  followed by drug eluting balloon angioplasty of an occluded left popliteal artery resulting reduction of a total occlusion to 50- 60%. Will plan for DAPT with ASA/plavix. His statin was changed from Simvastatin to Atorvastatin. Morning labs were stable. Able to ambulate without any complications. Instructed to stop his Pletal and Celebrex given addition of plavix/asa.   General: Well developed, well nourished, male appearing in no acute distress. Head: Normocephalic, atraumatic.  Neck: Supple without bruits, JVD. Lungs:  Resp regular and unlabored, CTA. Heart: RRR, S1, S2, no S3, S4, or murmur; no rub. Abdomen: Soft, non-tender, non-distended with normoactive bowel sounds. No hepatomegaly. No rebound/guarding. No obvious abdominal masses. Extremities: No clubbing, cyanosis, edema. Distal pedal pulses are 2+ bilaterally. R femoral cath site stable with bruising but hematoma Neuro: Alert and oriented X 3. Moves all extremities spontaneously. Psych: Normal affect.  Donald Palmer was seen by Dr. Martinique and determined stable for discharge home. Follow up in the office has been arranged. Medications are listed below.   _____________  Discharge Vitals Blood pressure 121/72, pulse 77, temperature 98.4 F (36.9 C), temperature source Oral, resp. rate 14, height _0  (1.753 m), weight 191 lb 12.8 oz (87 kg), SpO2 100 %.  Filed Weights   03/03/18 0901 03/04/18 0628  Weight: 195 lb (88.5 kg) 191 lb 12.8 oz (87 kg)    Labs & Radiologic Studies    CBC Recent Labs    03/03/18 1722 03/04/18 0608  WBC 5.4 6.0  HGB 11.7* 10.8*  HCT 37.5* 34.3*  MCV  72.8* 73.1*  PLT 213 785   Basic Metabolic Panel Recent Labs    03/04/18 0608  NA 135  K 4.3  CL 103  CO2 25  GLUCOSE 293*  BUN 10  CREATININE 1.16  CALCIUM 8.7*   Liver Function Tests No results for input(s): AST, ALT, ALKPHOS, BILITOT, PROT, ALBUMIN in the last 72 hours. No results for input(s): LIPASE, AMYLASE in the last 72  hours. Cardiac Enzymes No results for input(s): CKTOTAL, CKMB, CKMBINDEX, TROPONINI in the last 72 hours. BNP Invalid input(s): POCBNP D-Dimer No results for input(s): DDIMER in the last 72 hours. Hemoglobin A1C No results for input(s): HGBA1C in the last 72 hours. Fasting Lipid Panel No results for input(s): CHOL, HDL, LDLCALC, TRIG, CHOLHDL, LDLDIRECT in the last 72 hours. Thyroid Function Tests No results for input(s): TSH, T4TOTAL, T3FREE, THYROIDAB in the last 72 hours.  Invalid input(s): FREET3 _____________  Dg Chest 2 View  Result Date: 03/03/2018 CLINICAL DATA:  Peripheral artery disease, claudication EXAM: CHEST - 2 VIEW COMPARISON:  11/16/2013 FINDINGS: The heart size and mediastinal contours are within normal limits. Both lungs are clear. The visualized skeletal structures are unremarkable. IMPRESSION: No active cardiopulmonary disease. Electronically Signed   By: Franchot Gallo M.D.   On: 03/03/2018 08:31   Mr Lumbar Spine Wo Contrast  Result Date: 02/11/2018 CLINICAL DATA:  Back pain for over 6 weeks. EXAM: MRI LUMBAR SPINE WITHOUT CONTRAST TECHNIQUE: Multiplanar, multisequence MR imaging of the lumbar spine was performed. No intravenous contrast was administered. COMPARISON:  Lumbar spine MRI 07/15/2012 FINDINGS: Segmentation:  Standard. Alignment:  Physiologic. Vertebrae: No fracture, evidence of discitis, or aggressive bone lesion. Probable hemangioma in the left L4 inferior articular process. Conus medullaris and cauda equina: Conus extends to the L1 level. Conus and cauda equina appear normal. Paraspinal and other soft tissues: Negative Disc levels: T12- L1: Unremarkable. L1-L2: Unremarkable. L2-L3: Unremarkable. L3-L4: Small right foraminal disc protrusion that is noncompressive. L4-L5: Mild disc bulging. Small right foraminal protrusion that is noncompressive. Patent spinal canal L5-S1:Minor disc bulging.  Negative facets. IMPRESSION: 1. No compressive stenosis throughout  the lumbar spine. 2. Small right foraminal disc protrusions at L3-4 and L4-5. Electronically Signed   By: Monte Fantasia M.D.   On: 02/11/2018 17:31   Disposition   Pt is being discharged home today in good condition.  Follow-up Plans & Appointments    Follow-up Information    [facility] Follow up on 03/17/2018.   Specialty:  Cardiology Why:  at 12:30pm for your follow up dopplers Contact information: [address] [place] [phone]       Lorretta Harp, MD Follow up on 03/04/2018.   Specialties:  Cardiology, Radiology Why:  The office will call you with a follow up appt.  Contact information: [address] [place] [address] [phone]          Discharge Instructions    Call MD for:  redness, tenderness, or signs of infection (pain, swelling, redness, odor or green/yellow discharge around incision site)   Complete by:  As directed    Diet - low sodium heart healthy   Complete by:  As directed    Discharge instructions   Complete by:  As directed    Groin Site Care Refer to this sheet in the next few weeks. These instructions provide you with information on caring for yourself after your procedure. Your caregiver may also give you more specific instructions. Your treatment  has been planned according to current medical practices, but problems sometimes occur. Call your caregiver if you have any problems or questions after your procedure. HOME CARE INSTRUCTIONS You may shower 24 hours after the procedure. Remove the bandage (dressing) and gently wash the site with plain soap and water. Gently pat the site dry.  Do not apply powder or lotion to the site.  Do not sit in a bathtub, swimming pool, or whirlpool for 5 to 7 days.  No bending, squatting, or lifting anything over 10 pounds (4.5 kg) as directed by your caregiver.  Inspect the site at least twice daily.  Do not drive home if you are  discharged the same day of the procedure. Have someone else drive you.  You may drive 24 hours after the procedure unless otherwise instructed by your caregiver.  What to expect: Any bruising will usually fade within 1 to 2 weeks.  Blood that collects in the tissue (hematoma) may be painful to the touch. It should usually decrease in size and tenderness within 1 to 2 weeks.  SEEK IMMEDIATE MEDICAL CARE IF: You have unusual pain at the groin site or down the affected leg.  You have redness, warmth, swelling, or pain at the groin site.  You have drainage (other than a small amount of blood on the dressing).  You have chills.  You have a fever or persistent symptoms for more than 72 hours.  You have a fever and your symptoms suddenly get worse.  Your leg becomes pale, cool, tingly, or numb.  You have heavy bleeding from the site. Hold pressure on the site. Marland Kitchen  PLEASE DO NOT MISS ANY DOSES OF YOUR PLAVIX!!!!! Also keep a log of you blood pressures and bring back to your follow up appt. Please call the office with any questions.   Patients taking blood thinners should generally stay away from medicines like ibuprofen, Advil, Motrin, naproxen, and Aleve due to risk of stomach bleeding. You may take Tylenol as directed or talk to your primary doctor about alternatives.  Some studies suggest Prilosec/Omeprazole interacts with Plavix. We changed your Prilosec/Omeprazole to the equivalent dose of Protonix for less chance of interaction.   Increase activity slowly   Complete by:  As directed       Discharge Medications     Medication List    STOP taking these medications   celecoxib 200 MG capsule Commonly known as:  CELEBREX   cilostazol 100 MG tablet Commonly known as:  PLETAL   omeprazole 20 MG capsule Commonly known as:  PRILOSEC Replaced by:  pantoprazole 40 MG tablet   simvastatin 40 MG tablet Commonly known as:  ZOCOR     TAKE these medications   ACCU-CHEK AVIVA PLUS  w/Device Kit 1 each by Does not apply route 2 (two) times daily. To check blood sugar twice daily. diag code E11.40. Non insulin dependent. Please check with pt that this is the equipment he needs.   aspirin EC 81 MG tablet Take 1 tablet (81 mg total) by mouth daily.   atorvastatin 80 MG tablet Commonly known as:  LIPITOR Take 1 tablet (80 mg total) by mouth daily at 6 PM.   baclofen 10 MG tablet Commonly known as:  LIORESAL Take 1 tablet (10 mg total) by mouth every 8 (eight) hours as needed for muscle spasms.   cholecalciferol 1000 units tablet Commonly known as:  VITAMIN D Take 1 tablet (1,000 Units total) by mouth daily.   clopidogrel 75 MG  tablet Commonly known as:  PLAVIX Take 1 tablet (75 mg total) by mouth daily with breakfast.   clotrimazole-betamethasone cream Commonly known as:  LOTRISONE Apply 1 application topically 2 (two) times daily.   cyclobenzaprine 10 MG tablet Commonly known as:  FLEXERIL Take 1 tablet (10 mg total) by mouth at bedtime as needed for up to 15 doses for muscle spasms. What changed:  when to take this   fluticasone 50 MCG/ACT nasal spray Commonly known as:  FLONASE Place 2 sprays into both nostrils daily. 120/4=30 What changed:  See the new instructions.   glipiZIDE 2.5 MG 24 hr tablet Commonly known as:  GLUCOTROL XL Take 1 tablet (2.5 mg total) by mouth daily with breakfast. What changed:  when to take this   glucose blood test strip Commonly known as:  ACCU-CHEK AVIVA PLUS Check blood sugar one time a day as instructed (max check 2)   Lancet Devices Misc 1 each by Does not apply route 2 (two) times daily. Please use to check blood sugar 2 times daily. diag code E11.40. noninsulin dependent   lisinopril 20 MG tablet Commonly known as:  PRINIVIL,ZESTRIL Take 1 tablet (20 mg total) by mouth daily.   loratadine 10 MG tablet Commonly known as:  CLARITIN Take 1 tablet (10 mg total) by mouth daily.   nicotine polacrilex 2 MG  lozenge Commonly known as:  COMMIT Take 1 lozenge (2 mg total) by mouth every 2 (two) hours. May also take 1 lozenge (2 mg total) every hour as needed for smoking cessation.   pantoprazole 40 MG tablet Commonly known as:  PROTONIX Take 1 tablet (40 mg total) by mouth daily. Replaces:  omeprazole 20 MG capsule   pioglitazone 45 MG tablet Commonly known as:  ACTOS Take 1 tablet (45 mg total) by mouth daily. What changed:  when to take this   pregabalin 25 MG capsule Commonly known as:  LYRICA Take 1 capsule (25 mg total) by mouth 3 (three) times daily. What changed:  when to take this   tadalafil 10 MG tablet Commonly known as:  CIALIS Take 1 tablet (10 mg total) by mouth as needed for erectile dysfunction.       Outstanding Labs/Studies   Follow up dopplers, LFTs/FLP in 6 weeks if tolerating statin.   Duration of Discharge Encounter   Greater than 30 minutes including physician time.  Signed, Reino Bellis NP-C 03/04/2018, 9:25 AM

## 2018-03-03 NOTE — Interval H&P Note (Signed)
History and Physical Interval Note:  03/03/2018 9:49 AM  Donald Palmer  has presented today for surgery, with the diagnosis of Claudication  The various methods of treatment have been discussed with the patient and family. After consideration of risks, benefits and other options for treatment, the patient has consented to  Procedure(s): LOWER EXTREMITY INTERVENTION (N/A) as a surgical intervention .  The patient's history has been reviewed, patient examined, no change in status, stable for surgery.  I have reviewed the patient's chart and labs.  Questions were answered to the patient's satisfaction.     Quay Burow

## 2018-03-04 ENCOUNTER — Telehealth: Payer: Self-pay | Admitting: Cardiovascular Disease

## 2018-03-04 DIAGNOSIS — E1151 Type 2 diabetes mellitus with diabetic peripheral angiopathy without gangrene: Secondary | ICD-10-CM | POA: Diagnosis not present

## 2018-03-04 DIAGNOSIS — Z7982 Long term (current) use of aspirin: Secondary | ICD-10-CM | POA: Diagnosis not present

## 2018-03-04 DIAGNOSIS — E785 Hyperlipidemia, unspecified: Secondary | ICD-10-CM | POA: Diagnosis not present

## 2018-03-04 DIAGNOSIS — Z79899 Other long term (current) drug therapy: Secondary | ICD-10-CM | POA: Diagnosis not present

## 2018-03-04 DIAGNOSIS — I70212 Atherosclerosis of native arteries of extremities with intermittent claudication, left leg: Secondary | ICD-10-CM | POA: Diagnosis not present

## 2018-03-04 DIAGNOSIS — I7092 Chronic total occlusion of artery of the extremities: Secondary | ICD-10-CM | POA: Diagnosis not present

## 2018-03-04 DIAGNOSIS — E119 Type 2 diabetes mellitus without complications: Secondary | ICD-10-CM | POA: Diagnosis not present

## 2018-03-04 DIAGNOSIS — I739 Peripheral vascular disease, unspecified: Secondary | ICD-10-CM | POA: Diagnosis not present

## 2018-03-04 DIAGNOSIS — I10 Essential (primary) hypertension: Secondary | ICD-10-CM | POA: Diagnosis not present

## 2018-03-04 DIAGNOSIS — F17210 Nicotine dependence, cigarettes, uncomplicated: Secondary | ICD-10-CM | POA: Diagnosis not present

## 2018-03-04 LAB — BASIC METABOLIC PANEL
Anion gap: 7 (ref 5–15)
BUN: 10 mg/dL (ref 6–20)
CALCIUM: 8.7 mg/dL — AB (ref 8.9–10.3)
CHLORIDE: 103 mmol/L (ref 101–111)
CO2: 25 mmol/L (ref 22–32)
CREATININE: 1.16 mg/dL (ref 0.61–1.24)
GFR calc non Af Amer: >60 mL/min (ref >60)
Glucose, Bld: 293 mg/dL — ABNORMAL HIGH (ref 65–99)
Potassium: 4.3 mmol/L (ref 3.5–5.1)
SODIUM: 135 mmol/L (ref 135–145)

## 2018-03-04 LAB — CBC
HCT: 34.3 % — ABNORMAL LOW (ref 39.0–52.0)
Hemoglobin: 10.8 g/dL — ABNORMAL LOW (ref 13.0–17.0)
MCH: 23 pg — ABNORMAL LOW (ref 26.0–34.0)
MCHC: 31.5 g/dL (ref 30.0–36.0)
MCV: 73.1 fL — AB (ref 78.0–100.0)
PLATELETS: 215 10*3/uL (ref 150–400)
RBC: 4.69 MIL/uL (ref 4.22–5.81)
RDW: 15.5 % (ref 11.5–15.5)
WBC: 6 10*3/uL (ref 4.0–10.5)

## 2018-03-04 LAB — GLUCOSE, CAPILLARY: GLUCOSE-CAPILLARY: 253 mg/dL — AB (ref 65–99)

## 2018-03-04 MED ORDER — ATORVASTATIN CALCIUM 80 MG PO TABS: 80.0000 mg | ORAL_TABLET | Freq: Every day | ORAL | 1 refills | Status: DC

## 2018-03-04 MED ORDER — CLOPIDOGREL BISULFATE 75 MG PO TABS: 75.0000 mg | ORAL_TABLET | Freq: Every day | ORAL | 1 refills | Status: DC

## 2018-03-04 MED ORDER — PANTOPRAZOLE SODIUM 40 MG PO TBEC: 40.0000 mg | DELAYED_RELEASE_TABLET | Freq: Every day | ORAL | 1 refills | Status: DC

## 2018-03-04 MED FILL — Heparin Sodium (Porcine) 2 Unit/ML in Sodium Chloride 0.9%: INTRAMUSCULAR | Qty: 1000 | Status: AC

## 2018-03-04 MED FILL — Lidocaine HCl Local Inj 1%: INTRAMUSCULAR | Qty: 20 | Status: AC

## 2018-03-04 NOTE — Telephone Encounter (Signed)
Returned call to patient of Dr. Gwenlyn Found who had PV intervention yesterday. He did not listen to his VM, just called our number back. I informed him that I was unaware who tried to reach him. He states he will be discharged today, explained he will likely f/up with MD after his dopplers on 4/1. No further assistance needed.

## 2018-03-04 NOTE — Telephone Encounter (Signed)
New Message:    Pt is returning a call from earlier and is currently in the hospital.

## 2018-03-17 ENCOUNTER — Ambulatory Visit (HOSPITAL_COMMUNITY)
Admission: RE | Admit: 2018-03-17 | Discharge: 2018-03-17 | Disposition: A | Discharge status: Home / Self Care | Payer: Medicare HMO | Source: Ambulatory Visit | Attending: Cardiology | Admitting: Cardiology

## 2018-03-17 DIAGNOSIS — E119 Type 2 diabetes mellitus without complications: Secondary | ICD-10-CM | POA: Insufficient documentation

## 2018-03-17 DIAGNOSIS — I739 Peripheral vascular disease, unspecified: Secondary | ICD-10-CM | POA: Insufficient documentation

## 2018-03-17 DIAGNOSIS — I10 Essential (primary) hypertension: Secondary | ICD-10-CM | POA: Insufficient documentation

## 2018-03-17 DIAGNOSIS — F17200 Nicotine dependence, unspecified, uncomplicated: Secondary | ICD-10-CM | POA: Insufficient documentation

## 2018-03-17 DIAGNOSIS — E785 Hyperlipidemia, unspecified: Secondary | ICD-10-CM | POA: Diagnosis not present

## 2018-03-18 ENCOUNTER — Telehealth: Payer: Self-pay | Admitting: *Deleted

## 2018-03-18 NOTE — Telephone Encounter (Signed)
Courtesy call made to patient to follow up leg pain-no answer, message left on recorder.Donald Palmer, [name]4/2/201910:00 AM

## 2018-03-21 ENCOUNTER — Other Ambulatory Visit: Payer: Self-pay | Admitting: Internal Medicine

## 2018-03-21 DIAGNOSIS — E1140 Type 2 diabetes mellitus with diabetic neuropathy, unspecified: Secondary | ICD-10-CM

## 2018-03-24 NOTE — Telephone Encounter (Signed)
Pls sch PCP appt DM F/U April or May

## 2018-03-26 ENCOUNTER — Encounter: Payer: Self-pay | Admitting: Cardiovascular Disease

## 2018-03-26 ENCOUNTER — Ambulatory Visit (INDEPENDENT_AMBULATORY_CARE_PROVIDER_SITE_OTHER): Payer: Medicare HMO | Admitting: Cardiovascular Disease

## 2018-03-26 VITALS — BP 117/81 | HR 94 | Ht 69.0 in | Wt 188.6 lb

## 2018-03-26 DIAGNOSIS — Z0000 Encounter for general adult medical examination without abnormal findings: Secondary | ICD-10-CM | POA: Diagnosis not present

## 2018-03-26 DIAGNOSIS — I700 Atherosclerosis of aorta: Secondary | ICD-10-CM | POA: Diagnosis not present

## 2018-03-26 DIAGNOSIS — I739 Peripheral vascular disease, unspecified: Secondary | ICD-10-CM | POA: Diagnosis not present

## 2018-03-26 NOTE — Patient Instructions (Signed)
Medication Instructions:  Your physician recommends that you continue on your current medications as directed. Please refer to the Current Medication list given to you today.   Labwork: Your physician recommends that you return for lab work in: [place] (lipid/liver)   Testing/Procedures: none  Follow-Up: We request that you follow-up in: 6 months with an extender and in 12 months with Dr Andria Rhein will receive a reminder letter in the mail two months in advance. If you don't receive a letter, please call our office to schedule the follow-up appointment.    Any Other Special Instructions Will Be Listed Below (If Applicable).     If you need a refill on your cardiac medications before your next appointment, please call your pharmacy.

## 2018-03-26 NOTE — Assessment & Plan Note (Signed)
Donald Palmer returns today after his recent endovascular procedure which I performed on 03/03/18. He had an occluded left popliteal artery with three-vessel runoff and a 34-40% mid right SFA. He had left calf claudication. I performed heart one direction atherectomy followed by drug-eluting balloon angioplasty reducing a total occlusion to 50% without dissection. I decided not to perform stenting given the location. His claudication has completely resolved and his Dopplers have essentially normalized. He is on aspirin and Plavix.

## 2018-03-26 NOTE — Progress Notes (Signed)
03/26/2018 Donald Palmer   December 27, 1960  [id number]  Primary Physician [name], MD Primary Cardiologist: Lorretta Harp MD Lupe Carney, [place]  HPI:  Donald Palmer is a 57 y.o.  single African-American male father of 15, grandfather of 75 grandchildren referred by Dr. Lynnae January for cardiovascular vascular evaluation because of symptomatic left calf claudication. I last saw him in the office 02/19/18. His left ABI was 0.66 with nuclear popliteal artery.He has a history of 40-pack-years of tobacco as well as treated hypertension, diabetes and hyperlipidemia. He has never had a heart attack or stroke. He denies chest pain or shortness of breath. He is disabled because of his back. He is complaining of left calf claudication with segmental pressures to suggest PAD.   I then performed peripheral angiography, directional atherectomyon  03/03/18 revealing occluded left popliteal artery in the P2 segment and perform directional atherectomy with drug-eluting balloon angioplasty. His follow-up left ABI improved 0.83 and his claudication has completely resolved. He does continue to smoke a little bit and we talked about this.    Current Meds  Medication Sig  . aspirin EC 81 MG tablet Take 1 tablet (81 mg total) by mouth daily.  Marland Kitchen atorvastatin (LIPITOR) 80 MG tablet Take 1 tablet (80 mg total) by mouth daily at 6 PM.  . baclofen (LIORESAL) 10 MG tablet Take 1 tablet (10 mg total) by mouth every 8 (eight) hours as needed for muscle spasms.  . Blood Glucose Monitoring Suppl (ACCU-CHEK AVIVA PLUS) W/DEVICE KIT 1 each by Does not apply route 2 (two) times daily. To check blood sugar twice daily. diag code E11.40. Non insulin dependent. Please check with pt that this is the equipment he needs.  . cholecalciferol (VITAMIN D) 1000 units tablet Take 1 tablet (1,000 Units total) by mouth daily.  . clopidogrel (PLAVIX) 75 MG tablet Take 1 tablet (75 mg total) by mouth daily with breakfast.  .  clotrimazole-betamethasone (LOTRISONE) cream Apply 1 application topically 2 (two) times daily.  . cyclobenzaprine (FLEXERIL) 10 MG tablet Take 1 tablet (10 mg total) by mouth at bedtime as needed for up to 15 doses for muscle spasms. (Patient taking differently: Take 10 mg by mouth at bedtime. )  . fluticasone (FLONASE) 50 MCG/ACT nasal spray Place 2 sprays into both nostrils daily. 120/4=30 (Patient taking differently: Place 2 sprays into both nostrils daily)  . glipiZIDE (GLUCOTROL XL) 2.5 MG 24 hr tablet Take 1 tablet (2.5 mg total) by mouth daily with breakfast. (Patient taking differently: Take 2.5 mg by mouth 2 (two) times daily. )  . glucose blood (ACCU-CHEK AVIVA PLUS) test strip Check blood sugar one time a day as instructed (max check 2)  . Lancet Devices MISC 1 each by Does not apply route 2 (two) times daily. Please use to check blood sugar 2 times daily. diag code E11.40. noninsulin dependent  . lisinopril (PRINIVIL,ZESTRIL) 20 MG tablet Take 1 tablet (20 mg total) by mouth daily.  Marland Kitchen loratadine (CLARITIN) 10 MG tablet Take 1 tablet (10 mg total) by mouth daily.  . nicotine polacrilex (COMMIT) 2 MG lozenge Take 1 lozenge (2 mg total) by mouth every 2 (two) hours. May also take 1 lozenge (2 mg total) every hour as needed for smoking cessation. (Patient taking differently: No sig reported)  . pantoprazole (PROTONIX) 40 MG tablet Take 1 tablet (40 mg total) by mouth daily.  . pioglitazone (ACTOS) 45 MG tablet take ONE tablet BY MOUTH DAILY  .  pregabalin (LYRICA) 25 MG capsule Take 1 capsule (25 mg total) by mouth 3 (three) times daily. (Patient taking differently: Take 25 mg by mouth daily. )     Allergies  Allergen Reactions  . Metformin And Related Other (See Comments)    GI side effects. Stopped 2016    Social History   Socioeconomic History  . Marital status: Single    Spouse name: Not on file  . Number of children: Not on file  . Years of education: Not on file  . Highest  education level: Not on file  Occupational History  . Not on file  Social Needs  . Financial resource strain: Not on file  . Food insecurity:    Worry: Not on file    Inability: Not on file  . Transportation needs:    Medical: Not on file    Non-medical: Not on file  Tobacco Use  . Smoking status: Current Some Day Smoker    Packs/day: 0.10    Years: 30.00    Pack years: 3.00    Types: Cigarettes, Cigars  . Smokeless tobacco: Never Used  . Tobacco comment: 3cigs/day.  Substance and Sexual Activity  . Alcohol use: Yes    Alcohol/week: 3.0 oz    Types: 5 Cans of beer per week    Comment: Beer.  . Drug use: Yes    Frequency: 1.0 times per week    Types: Marijuana    Comment: rarely  . Sexual activity: Yes  Lifestyle  . Physical activity:    Days per week: Not on file    Minutes per session: Not on file  . Stress: Not on file  Relationships  . Social connections:    Talks on phone: Not on file    Gets together: Not on file    Attends religious service: Not on file    Active member of club or organization: Not on file    Attends meetings of clubs or organizations: Not on file    Relationship status: Not on file  . Intimate partner violence:    Fear of current or ex partner: Not on file    Emotionally abused: Not on file    Physically abused: Not on file    Forced sexual activity: Not on file  Other Topics Concern  . Not on file  Social History Narrative   Prior incarceration. Tattoos, inc ones obtained in prison.    On disability since 09/2013     Review of Systems: General: negative for chills, fever, night sweats or weight changes.  Cardiovascular: negative for chest pain, dyspnea on exertion, edema, orthopnea, palpitations, paroxysmal nocturnal dyspnea or shortness of breath Dermatological: negative for rash Respiratory: negative for cough or wheezing Urologic: negative for hematuria Abdominal: negative for nausea, vomiting, diarrhea, bright red blood per  rectum, melena, or hematemesis Neurologic: negative for visual changes, syncope, or dizziness All other systems reviewed and are otherwise negative except as noted above.    Blood pressure 117/81, pulse 94, height 5' 9"  (1.753 m), weight 188 lb 9.6 oz (85.5 kg).  General appearance: alert and no distress Neck: no adenopathy, no carotid bruit, no JVD, supple, symmetrical, trachea midline and thyroid not enlarged, symmetric, no tenderness/mass/nodules Lungs: clear to auscultation bilaterally Heart: regular rate and rhythm, S1, S2 normal, no murmur, click, rub or gallop Extremities: extremities normal, atraumatic, no cyanosis or edema Pulses: 2+ and symmetric Skin: Skin color, texture, turgor normal. No rashes or lesions Neurologic: Alert and oriented X 3,  normal strength and tone. Normal symmetric reflexes. Normal coordination and gait  EKG not performed today  ASSESSMENT AND PLAN:   Claudication in peripheral vascular disease [facility]) Mr. Montour returns today after his recent endovascular procedure which I performed on 03/03/18. He had an occluded left popliteal artery with three-vessel runoff and a 34-40% mid right SFA. He had left calf claudication. I performed heart one direction atherectomy followed by drug-eluting balloon angioplasty reducing a total occlusion to 50% without dissection. I decided not to perform stenting given the location. His claudication has completely resolved and his Dopplers have essentially normalized. He is on aspirin and Plavix.      Lorretta Harp MD FACP,FACC,FAHA, [facility] 03/26/2018 11:55 AM

## 2018-04-10 ENCOUNTER — Ambulatory Visit (INDEPENDENT_AMBULATORY_CARE_PROVIDER_SITE_OTHER): Payer: Medicare HMO | Admitting: Internal Medicine

## 2018-04-10 ENCOUNTER — Other Ambulatory Visit: Payer: Self-pay | Admitting: Pharmacist

## 2018-04-10 VITALS — BP 117/50 | HR 92 | Temp 98.2°F | Wt 188.3 lb

## 2018-04-10 DIAGNOSIS — I700 Atherosclerosis of aorta: Secondary | ICD-10-CM | POA: Diagnosis not present

## 2018-04-10 DIAGNOSIS — I739 Peripheral vascular disease, unspecified: Secondary | ICD-10-CM

## 2018-04-10 DIAGNOSIS — Z79899 Other long term (current) drug therapy: Secondary | ICD-10-CM

## 2018-04-10 DIAGNOSIS — N521 Erectile dysfunction due to diseases classified elsewhere: Secondary | ICD-10-CM | POA: Diagnosis not present

## 2018-04-10 DIAGNOSIS — M545 Low back pain: Secondary | ICD-10-CM | POA: Diagnosis not present

## 2018-04-10 DIAGNOSIS — G894 Chronic pain syndrome: Secondary | ICD-10-CM | POA: Diagnosis not present

## 2018-04-10 DIAGNOSIS — Z9862 Peripheral vascular angioplasty status: Secondary | ICD-10-CM

## 2018-04-10 DIAGNOSIS — E1151 Type 2 diabetes mellitus with diabetic peripheral angiopathy without gangrene: Secondary | ICD-10-CM

## 2018-04-10 DIAGNOSIS — F17210 Nicotine dependence, cigarettes, uncomplicated: Secondary | ICD-10-CM | POA: Diagnosis not present

## 2018-04-10 DIAGNOSIS — E1140 Type 2 diabetes mellitus with diabetic neuropathy, unspecified: Secondary | ICD-10-CM | POA: Diagnosis not present

## 2018-04-10 DIAGNOSIS — E611 Iron deficiency: Secondary | ICD-10-CM | POA: Diagnosis not present

## 2018-04-10 DIAGNOSIS — E1142 Type 2 diabetes mellitus with diabetic polyneuropathy: Secondary | ICD-10-CM | POA: Diagnosis not present

## 2018-04-10 DIAGNOSIS — I70208 Unspecified atherosclerosis of native arteries of extremities, other extremity: Secondary | ICD-10-CM

## 2018-04-10 DIAGNOSIS — R1031 Right lower quadrant pain: Secondary | ICD-10-CM | POA: Diagnosis not present

## 2018-04-10 DIAGNOSIS — R6882 Decreased libido: Secondary | ICD-10-CM | POA: Diagnosis not present

## 2018-04-10 DIAGNOSIS — R269 Unspecified abnormalities of gait and mobility: Secondary | ICD-10-CM | POA: Diagnosis not present

## 2018-04-10 DIAGNOSIS — Z7984 Long term (current) use of oral hypoglycemic drugs: Secondary | ICD-10-CM

## 2018-04-10 DIAGNOSIS — E1169 Type 2 diabetes mellitus with other specified complication: Secondary | ICD-10-CM

## 2018-04-10 DIAGNOSIS — Z7982 Long term (current) use of aspirin: Secondary | ICD-10-CM

## 2018-04-10 DIAGNOSIS — M5416 Radiculopathy, lumbar region: Secondary | ICD-10-CM | POA: Diagnosis not present

## 2018-04-10 DIAGNOSIS — N529 Male erectile dysfunction, unspecified: Secondary | ICD-10-CM | POA: Diagnosis not present

## 2018-04-10 DIAGNOSIS — L818 Other specified disorders of pigmentation: Secondary | ICD-10-CM

## 2018-04-10 DIAGNOSIS — Z1211 Encounter for screening for malignant neoplasm of colon: Secondary | ICD-10-CM

## 2018-04-10 LAB — POCT GLYCOSYLATED HEMOGLOBIN (HGB A1C): Hemoglobin A1C: 8.6

## 2018-04-10 LAB — GLUCOSE, CAPILLARY: Glucose-Capillary: 305 mg/dL — ABNORMAL HIGH (ref 65–99)

## 2018-04-10 MED ORDER — LIRAGLUTIDE 18 MG/3ML ~~LOC~~ SOPN: 0.6000 mg | PEN_INJECTOR | Freq: Every day | SUBCUTANEOUS | 0 refills | Status: DC

## 2018-04-10 MED ORDER — PREGABALIN 50 MG PO CAPS: 50.0000 mg | ORAL_CAPSULE | Freq: Three times a day (TID) | ORAL | 1 refills | Status: DC

## 2018-04-10 NOTE — Progress Notes (Signed)
Price check Victoza $3 per PCP

## 2018-04-10 NOTE — Patient Instructions (Signed)
1. Please mail in the stool cards for colon cancer screening 2. I am getting an Xray of your Right hip to see if causing the groin pain 3. Please increase Lyrica to 50 mg three times a day to help the neuropathy 4. See Dr Lynnae January 3 months for diabetes follow up 5. Stop Glipizide for your sugar 6. Start liraglutide injection for your sugar and to prevent the low blood flow in leg from returning. It is at your pharmacy 7. Stop smoking!!! 8. Come back in 2-3 weeks for continuous glucose monitor placement 9. One week after placement, return for [facility] and [facility]ch Penny appt

## 2018-04-10 NOTE — Progress Notes (Signed)
90-day supply of Victoza quoted by pharmacy as $3 copay. Prescription re-sent for 90-day supply

## 2018-04-10 NOTE — Progress Notes (Signed)
   Subjective:    [name], male    DOB: September 22, 1961, 57 y.o.   MRN: [id number]  HPI  Donald Palmer is here for PAD F/U. Please see the A&P for the status of the pt's chronic medical problems.  ROS : per ROS section and in problem oriented charting. All other systems are negative.  PMHx, Soc hx, and / or Fam hx : His "girl" smokes. He is down to about 3 cigarettes per day.   Review of Systems  Constitutional: Negative for activity change and appetite change.  Endocrine:       Decreased libido  Musculoskeletal: Positive for arthralgias, back pain and gait problem.  Neurological:       Neuropathy feet bilaterally       Objective:   Physical Exam  Constitutional: He appears well-developed and well-nourished. No distress.  HENT:  Head: Normocephalic and atraumatic.  Right Ear: External ear normal.  Left Ear: External ear normal.  Nose: Nose normal.  Eyes: Conjunctivae and EOM are normal. Right eye exhibits no discharge. Left eye exhibits no discharge. No scleral icterus.  Cardiovascular: Normal rate, regular rhythm and normal heart sounds.  No murmur heard. Musculoskeletal: He exhibits no edema, tenderness or deformity.  Back symmetrical with point tenderness right paraspinal area lower lumbar region  Neurological: He is alert.  Skin: Skin is warm and dry. He is not diaphoretic.  Multiple tattoos  Psychiatric: He has a normal mood and affect. His behavior is normal. Judgment and thought content normal.      Assessment & Plan:

## 2018-04-11 LAB — HEPATIC FUNCTION PANEL
ALT: 16 IU/L (ref 0–44)
AST: 22 IU/L (ref 0–40)
Albumin: 4.4 g/dL (ref 3.5–5.5)
Alkaline Phosphatase: 118 IU/L — ABNORMAL HIGH (ref 39–117)
Bilirubin Total: 0.2 mg/dL (ref 0.0–1.2)
Bilirubin, Direct: 0.07 mg/dL (ref 0.00–0.40)
TOTAL PROTEIN: 7.4 g/dL (ref 6.0–8.5)

## 2018-04-11 LAB — LIPID PANEL
CHOLESTEROL TOTAL: 148 mg/dL (ref 100–199)
Chol/HDL Ratio: 3.9 ratio (ref 0.0–5.0)
HDL: 38 mg/dL — AB (ref >39)
LDL CALC: 88 mg/dL (ref 0–99)
TRIGLYCERIDES: 109 mg/dL (ref 0–149)
VLDL CHOLESTEROL CAL: 22 mg/dL (ref 5–40)

## 2018-04-11 NOTE — Assessment & Plan Note (Addendum)
This problem is chronic and improved. I have reviewed his medical course and summarized here : arteria dopplers in overview; on 3/18 atherectomy, drug eluting balloon angioplasty of occluded L pop artery reduction of a total occlusion to 50 - 60% stenosis. He picked up his statin and pletal this month. He states he is taking an ASA. Does not know how long Dr Gwenlyn Found wants him on dual anti-plt meds, next appt with Nov. He is not walking much 2/2 other back pain, R groin pain, and B neuropathy.  He is still smoking about 2 cigarettes/week.  His significant other smokes which is making it hard for him to completely quit.  He understands that Dr. Alvester Chou told him if he does not quit smoking there is a very high chance that he will get a another occlusion of a peripheral artery.  PLAN : ASA Pletal High inten statin Walking

## 2018-04-11 NOTE — Assessment & Plan Note (Signed)
This problem is chronic and uncontrolled.  He had been on gabapentin for his diabetic neuropathy but did not feel that this is controlling his symptoms.  I switched him to pregabalin 25 mg 3 times daily.  He states he still has numbness of his toes along with pain.  This feels like a sticking pain.  It is worst in his left foot that is also present in the right foot.  I am working on getting better diabetic control.  Since he has both negative and positive symptoms, he is likely hopefully will respond to pharmacologic therapy.  I am increasing his pre-gabalin to 50 TID.  PLAN : DM control Increase pregabalin 50 TID

## 2018-04-11 NOTE — Assessment & Plan Note (Signed)
This problem is chronic and worsened.  His A1c trend has been 8.3 -7.9 -8.6 today.  He is on Actos 45 and glipizide 2.5.  Due to his complicating peripheral arterial disease, I am going to stop his glipizide and substitute liraglutide today.  The pharmacy team instructed him how to use this medication and sent it into his preferred pharmacy and discovered it is only a $3 co-pay.  Because he is having erectile dysfunction, neuropathy, and peripheral arterial disease, I would like to get his A1c <7.  Therefore, he will return a couple of weeks after starting the liraglutide to have a continuous glucose monitor placed to help titrate the medication and determine his control.  PLAN : Continue Actos Stop glipizide Start liraglutide Return in 1 to 2 weeks for continuous glucose monitor

## 2018-04-11 NOTE — Assessment & Plan Note (Signed)
This problem is chronic and uncontrolled.  I stressed today the importance of colon cancer screening and offered him the option of either stool cards or a colonoscopy.  He chose stool cards.  He went to the lab and obtained stool cards and I have encouraged him to mail those back in.  PLAN : colon cancer screening

## 2018-04-11 NOTE — Assessment & Plan Note (Addendum)
This problem is chronic and uncontrolled.  He continues to complain of pain in his right sided lumbar back that radiates through his right groin and into the testicle.  There is no radicular pain down his leg.  He states first that this is been going on years.  On further clarification, the radicular pain down his leg has been going on for years but the pain around the groin is not that chronic.  He had an MRI of his lumbar spine in February of this year which did not show any etiology for this pain.  The location and description of the pain sounds more like hip pathology and I have ordered a plain film of his right hip.  PLAN : plain film R hip PT

## 2018-04-11 NOTE — Assessment & Plan Note (Signed)
This problem is chronic and uncontrolled.  He continues to complain of decreased sex drive.  This could likely be multifactorial including poorly controlled diabetes, peripheral arterial disease, smoking, and pain.  I am going to start by trying to get his diabetes under control.  His peripheral arterial disease has been intervened on and is improved.  He has been counseled extensively on smoking cessation.  And I am starting to work-up his pain today.  PLAN : multi -Interventional approach

## 2018-04-16 DIAGNOSIS — Z1211 Encounter for screening for malignant neoplasm of colon: Secondary | ICD-10-CM | POA: Diagnosis not present

## 2018-04-23 ENCOUNTER — Other Ambulatory Visit: Payer: Self-pay

## 2018-04-23 ENCOUNTER — Ambulatory Visit: Payer: Medicare HMO | Attending: Internal Medicine | Admitting: Physical Therapy

## 2018-04-23 ENCOUNTER — Other Ambulatory Visit: Payer: Medicare HMO

## 2018-04-23 ENCOUNTER — Encounter: Payer: Self-pay | Admitting: Physical Therapy

## 2018-04-23 DIAGNOSIS — G8929 Other chronic pain: Secondary | ICD-10-CM | POA: Diagnosis not present

## 2018-04-23 DIAGNOSIS — M5441 Lumbago with sciatica, right side: Secondary | ICD-10-CM | POA: Insufficient documentation

## 2018-04-23 DIAGNOSIS — M6281 Muscle weakness (generalized): Secondary | ICD-10-CM | POA: Insufficient documentation

## 2018-04-23 DIAGNOSIS — R262 Difficulty in walking, not elsewhere classified: Secondary | ICD-10-CM

## 2018-04-23 DIAGNOSIS — Z1211 Encounter for screening for malignant neoplasm of colon: Secondary | ICD-10-CM

## 2018-04-23 NOTE — Addendum Note (Signed)
Addended by: Truddie Crumble on: 04/23/2018 04:40 PM   Modules accepted: Orders

## 2018-04-23 NOTE — Therapy (Signed)
[facility] [address]  [place], [place], [address] Phone: [phone]   Fax:  [phone]  Physical Therapy Evaluation  Patient Details  Name: LANIS STORLIE MRN: [id number] Date of Birth: [birth year] Referring Provider: Larey Dresser, MD   Encounter Date: 04/23/2018  PT End of Session - 04/23/18 0832    Visit Number  1    Number of Visits  12    Date for PT Re-Evaluation  06/06/18    Authorization Type  [employer] Medicare HMO + Medicaid    PT Start Time  0830    PT Stop Time  0939    PT Time Calculation (min)  69 min    Activity Tolerance  Patient tolerated treatment well    Behavior During Therapy  [facility] for tasks assessed/performed       Past Medical History:  Diagnosis Date  . Back pain, chronic   . Diabetes mellitus 2007  . Hepatitis C    [id number]  . Hypertension goal BP (blood pressure) < 140/80   . Microcytic anemia   . Neuromuscular disorder ([place])   . PAD (peripheral artery disease) ([place])   . Pancreatitis 01/09/2012    Past Surgical History:  Procedure Laterality Date  . ABDOMINAL AORTOGRAM N/A 03/03/2018   Procedure: ABDOMINAL AORTOGRAM;  Surgeon: Lorretta Harp, MD;  Location: [facility];  Service: Cardiovascular;  Laterality: N/A;  . I&D EXTREMITY Right 05/15/2016   Procedure: RIGHT INDEX FINGER FIRST THROUGH  MIDDLE PHALYNX  AMPUTATION;  Surgeon: [name], MD;  Location: [facility];  Service: Orthopedics;  Laterality: Right;  . LOWER EXTREMITY INTERVENTION Bilateral 03/03/2018   Procedure: LOWER EXTREMITY INTERVENTION;  Surgeon: Lorretta Harp, MD;  Location: [facility];  Service: Cardiovascular;  Laterality: Bilateral;  . OPEN REDUCTION INTERNAL FIXATION (ORIF) DISTAL PHALANX Right 04/02/2016   Procedure: RIGHT INDEX FINGER REPAIR;  Surgeon: [name], MD;  Location: [facility];  Service: Orthopedics;  Laterality: Right;  . PERIPHERAL VASCULAR ATHERECTOMY  Left 03/03/2018   Procedure: PERIPHERAL VASCULAR ATHERECTOMY;  Surgeon: Lorretta Harp, MD;  Location: [facility];  Service: Cardiovascular;  Laterality: Left;  SFA WITH PTA DRUG COATED BALLOON    There were no vitals filed for this visit.   Subjective Assessment - 04/23/18 0832    Subjective  Pt. is reporting that he has experienced low back pain for along time, stating that it has been present since 2007-2008. Pt. reporting that he has "bulging discs" in the low back. Pain typically radiating into the R groin and down the R LE.    Limitations  Sitting;Standing    How long can you sit comfortably?  Less than 5 minutes    How long can you stand comfortably?  Less than 5 minutes    Patient Stated Goals  Decrease Pain in Back    Currently in Pain?  Yes    Pain Score  9     Pain Location  Back    Pain Orientation  Mid;Right    Pain Descriptors / Indicators  Throbbing;Sore    Pain Type  Chronic pain    Pain Radiating Towards  Radiates down into the R Groin and Lower Leg    Pain Onset  More than a month ago    Pain Frequency  Constant    Aggravating Factors   Sitting, Standing    Pain Relieving Factors  TENS, Heat/Ice (Not much relief)  Effect of Pain on Daily Activities  Work, Unable to [employer] PT Assessment - 04/23/18 [phone]      Assessment   Medical Diagnosis  Low Back Pain + R Groin Pain    Referring Provider  Larey Dresser, MD    Onset Date/Surgical Date  -- Since 2007-2008    Next MD Visit  -- Follow Up in Approx 3 Weeks    Prior Therapy  Yes (Low Back)       Balance Screen   Has the patient fallen in the past 6 months  No    Has the patient had a decrease in activity level because of a fear of falling?   No    Is the patient reluctant to leave their home because of a fear of falling?   No      Home Film/video editor residence    Living Arrangements  Spouse/significant other    Available Help at Discharge  Family     Type of [place] to enter    Entrance Stairs-Number of Steps  5    Entrance Stairs-Rails  Right;Left    Home Layout  One level    [place] - single point      Prior Function   Level of [place]  Unemployed    Leisure  Relax      Observation/Other Assessments   Focus on Therapeutic Outcomes (FOTO)   Lumbar - 42% (58% Limitation); 52% Predicted (48% Predicted Limitation)       Posture/Postural Control   Posture/Postural Control  Postural limitations    Postural Limitations  Forward head;Rounded Shoulders      ROM / Strength   AROM / PROM / Strength  AROM;Strength      AROM   Overall AROM Comments  B Hip ROM WFL    AROM Assessment Site  Lumbar    Lumbar Flexion  2 inch below knee    Lumbar Extension  50% Limitation    Lumbar - Right Side Bend  75% Limiation pain + apprehension to attempt movement    Lumbar - Left Side Bend  to knee joint line pulling sensation    Lumbar - Right Rotation  WFL    Lumbar - Left Rotation  [facility]      Strength   Strength Assessment Site  Knee;Hip    Right/Left Hip  Right;Left    Right Hip Flexion  4-/5    Right Hip Extension  4/5    Right Hip External Rotation   4/5    Right Hip Internal Rotation  4-/5    Right Hip ABduction  4/5    Right Hip ADduction  4/5    Left Hip Flexion  4/5    Left Hip Extension  4/5    Left Hip External Rotation  4+/5    Left Hip Internal Rotation  4/5    Left Hip ABduction  4+/5    Left Hip ADduction  4/5    Right/Left Knee  Right;Left    Right Knee Flexion  4+/5    Right Knee Extension  4+/5    Left Knee Flexion  4+/5    Left Knee Extension  4/5      Flexibility   Soft Tissue Assessment /Muscle Length  yes    Hamstrings  Moderate Tightness R>L  Quadriceps  B Mild/Mod Tightness    ITB  B Moderate Tightness    Piriformis  B Moderate Tightness (Pain w/ R)       Palpation   Palpation comment  TTP B Lumbar Paraspinals w/ Moderate Tightness       Special Tests    Special Tests  Hip Special Tests    Hip Special Tests   Ober's Test      Ober's Test   Findings  Negative    Side  Right;Left                Objective measurements completed on examination: See above findings.      [facility] Adult PT Treatment/Exercise - 04/23/18 0842      Self-Care   Self-Care  Other Self-Care Comments    Other Self-Care Comments   Pt. educated on STM to R Lumbar Paraspinals      Exercises   Exercises  Lumbar      Lumbar Exercises: Stretches   Active Hamstring Stretch  1 rep;30 seconds    Active Hamstring Stretch Limitations  seated    Piriformis Stretch  Right;1 rep;30 seconds    Piriformis Stretch Limitations  seated w/ hip hinge    Other Lumbar Stretch Exercise  Prayer Stretch w/ PBall (Forward/Right/Left) x 30"       Lumbar Exercises: Supine   Other Supine Lumbar Exercises  Lower Trunk Rotations x 10 reps      Modalities   Modalities  Electrical Stimulation;Moist Heat      Moist Heat Therapy   Number Minutes Moist Heat  15 Minutes    Moist Heat Location  Lumbar Spine      Electrical Stimulation   Electrical Stimulation Location  Lumbar Spine    Electrical Stimulation Action  IFC    Electrical Stimulation Parameters  80-150 Hz, 15 Minutes    Electrical Stimulation Goals  Pain             PT Education - 04/23/18 0934    Education provided  Yes    Education Details  Pt. educated on evaluation findings, plan of care, and initial HEP    Person(s) Educated  Patient    Methods  Explanation;Demonstration;Handout    Comprehension  Returned demonstration;Verbalized understanding       PT Short Term Goals - 04/23/18 0949      PT SHORT TERM GOAL #1   Title  Indepedent w/ Initial HEP    Status  New    Target Date  05/09/18      PT SHORT TERM GOAL #2   Title  Perform gait assessment to ensure patient is utilizing [facility] appropriately    Status  New    Target Date  05/09/18      PT SHORT TERM GOAL #3   Title   Pt. will report understanding of proper posture/body mechanics to prevent LBP    Status  New    Target Date  05/09/18        PT Long Term Goals - 04/23/18 0951      PT LONG TERM GOAL #1   Title  Indepedent w/ ongoing HEP    Status  New    Target Date  06/06/18      PT LONG TERM GOAL #2   Title  Pt. will demonstrate >/= to 4/5 strength in R Hip musculature to improve proximal stability     Status  New    Target Date  06/06/18  PT LONG TERM GOAL #3   Title  Pt. will demonstrate Lumbar ROM WFL    Status  New    Target Date  06/06/18      PT LONG TERM GOAL #4   Title  Pt. will report increased tolerance for standing >/= 20 minutes w/o LBP     Status  New    Target Date  06/06/18             Plan - 04/23/18 0956    Clinical Impression Statement  Donald Palmer is a 57 y.o. male that presents to OP PT with chronic LBP that has been present for 11-12 years. Pt. reporting that he has been informed he has bulging discs in his lumbar spine, and prefers to have surgery. LBP radiates in R groin and LE, and stays relatively constant. Pt. is currently ambulating w/ SPC, in which he reported using to help stabilize himself and offload the back and R LE. Pt. was TTP at L2-4 region, w/ moderate tightness in lumbar paraspinals. Pt. also demonstrating decreased Lumbar ROM and decreased hip strength in R LE in comparison to the L. Pt. currently unable to work due to impairments, and wishes to get back to a light duty job. Pt. reporting increased relief from electrical stimulation, therefore ended session w/ heat/estim to allow for further decrease in pain. Pt. will benefit from skilled physical therapy to address impairments and allow for patient to get back to functional activities.     History and Personal Factors relevant to plan of care:  Arthritis, Diabetes, Hypertension    Clinical Presentation  Evolving    Clinical Presentation due to:  Pt. is a male suffering from chronic LBP with multiple  comorbities that may impact POC    Clinical Decision Making  Moderate    Rehab Potential  Good    PT Frequency  2x / week    PT Duration  6 weeks    PT Treatment/Interventions  ADLs/Self Care Home Management;Cryotherapy;Moist Heat;Traction;Ultrasound;Electrical Stimulation;Iontophoresis 4mg /ml Dexamethasone;DME Instruction;Gait training;Stair training;Functional mobility training;Therapeutic activities;Therapeutic exercise;Balance training;Neuromuscular re-education;Patient/family education;Manual techniques;Taping;Dry needling;Passive range of motion    Consulted and Agree with Plan of Care  Patient       Patient will benefit from skilled therapeutic intervention in order to improve the following deficits and impairments:  Abnormal gait, Decreased balance, Decreased endurance, Decreased range of motion, Difficulty walking, Increased muscle spasms, Pain, Postural dysfunction, Impaired flexibility, Decreased strength, Decreased knowledge of use of DME, Decreased activity tolerance  Visit Diagnosis: Chronic midline low back pain with right-sided sciatica  Difficulty in walking, not elsewhere classified  Muscle weakness (generalized)     Problem List Patient Active Problem List   Diagnosis Date Noted  . Claudication in peripheral vascular disease ([place]) 03/03/2018  . PAD (peripheral artery disease) ([place]) 01/29/2018  . Vitamin D deficiency 12/24/2017  . Iron deficiency 12/27/2016  . Erectile dysfunction associated with type 2 diabetes mellitus ([place]) 03/01/2016  . Atherosclerosis of aorta ([place]) 03/30/2015  . Chronic pain syndrome 03/18/2015  . Hyperlipidemia associated with type 2 diabetes mellitus ([place]) 12/22/2014  . Allergic rhinitis 10/22/2014  . Preventative health care 12/08/2012  . Lumbar radiculitis 09/16/2012  . GERD (gastroesophageal reflux disease) 07/08/2012  . History of chronic hepatitis C 05/13/2012  . Diabetic polyradiculopathy associated with type 2 diabetes mellitus  ([place]) 04/30/2012  . History of hypertension 01/30/2012  . Type 2 diabetes mellitus with diabetic neuropathy ([place]) 12/17/2001    Baldomero Lamy, SPT 04/23/2018, 12:48 PM  [facility] [address]  [place], [place], [address] Phone: [phone]   Fax:  [phone]  Name: AXL RODINO MRN: [id number] Date of Birth: 1961/04/17

## 2018-04-24 LAB — FECAL OCCULT BLOOD, IMMUNOCHEMICAL: Fecal Occult Bld: NEGATIVE

## 2018-04-28 ENCOUNTER — Ambulatory Visit: Payer: Medicare HMO

## 2018-04-29 ENCOUNTER — Telehealth: Payer: Self-pay | Admitting: *Deleted

## 2018-04-29 DIAGNOSIS — R748 Abnormal levels of other serum enzymes: Secondary | ICD-10-CM

## 2018-04-29 NOTE — Telephone Encounter (Signed)
-----   Message from Lorretta Harp, MD sent at 04/13/2018  2:25 PM EDT ----- On  High dose statin drug with acceptable FLP. Alp phos mildly elevated. Repeat LFTs 3 months

## 2018-04-29 NOTE — Telephone Encounter (Signed)
Spoke with pt, aware of lab results and need for repeat lab work. Lab orders mailed to the pt

## 2018-04-30 ENCOUNTER — Other Ambulatory Visit: Payer: Self-pay

## 2018-04-30 ENCOUNTER — Ambulatory Visit (INDEPENDENT_AMBULATORY_CARE_PROVIDER_SITE_OTHER): Payer: Medicare HMO | Admitting: Internal Medicine

## 2018-04-30 VITALS — BP 137/88 | HR 79 | Temp 97.8°F | Ht 69.0 in | Wt 191.4 lb

## 2018-04-30 DIAGNOSIS — R5383 Other fatigue: Secondary | ICD-10-CM

## 2018-04-30 DIAGNOSIS — M545 Low back pain: Secondary | ICD-10-CM | POA: Diagnosis not present

## 2018-04-30 DIAGNOSIS — M5116 Intervertebral disc disorders with radiculopathy, lumbar region: Secondary | ICD-10-CM | POA: Diagnosis not present

## 2018-04-30 DIAGNOSIS — R110 Nausea: Secondary | ICD-10-CM

## 2018-04-30 DIAGNOSIS — G8929 Other chronic pain: Secondary | ICD-10-CM

## 2018-04-30 DIAGNOSIS — R29898 Other symptoms and signs involving the musculoskeletal system: Secondary | ICD-10-CM | POA: Diagnosis not present

## 2018-04-30 DIAGNOSIS — E119 Type 2 diabetes mellitus without complications: Secondary | ICD-10-CM | POA: Diagnosis not present

## 2018-04-30 DIAGNOSIS — Z7984 Long term (current) use of oral hypoglycemic drugs: Secondary | ICD-10-CM

## 2018-04-30 LAB — BASIC METABOLIC PANEL
ANION GAP: 8 (ref 5–15)
BUN: 8 mg/dL (ref 6–20)
CALCIUM: 9.4 mg/dL (ref 8.9–10.3)
CO2: 26 mmol/L (ref 22–32)
CREATININE: 1.06 mg/dL (ref 0.61–1.24)
Chloride: 104 mmol/L (ref 101–111)
GFR calc non Af Amer: >60 mL/min (ref >60)
Glucose, Bld: 175 mg/dL — ABNORMAL HIGH (ref 65–99)
Potassium: 4.2 mmol/L (ref 3.5–5.1)
Sodium: 138 mmol/L (ref 135–145)

## 2018-04-30 LAB — CK TOTAL AND CKMB (NOT AT ARMC)
CK TOTAL: 201 U/L (ref 49–397)
CK, MB: 2.6 ng/mL (ref 0.5–5.0)
Relative Index: 1.3 (ref 0.0–2.5)

## 2018-04-30 NOTE — Patient Instructions (Signed)
I recommend you stop taking the Victoza for now and we are also checking some blood tests today. I have some concern your decreased appetite and/or medication effects could be causing this new symptom.  We will need to see how you do with stopping the medicine for a few days. Further testing might be needed if this is not getting better.

## 2018-04-30 NOTE — Progress Notes (Signed)
   CC: Nausea and fatigue  HPI:  Donald Palmer is a 57 y.o. male with PMHx detailed below presenting after waking up with new bilateral leg weakness today. He has a history of chronic low back pain from lumbar disc disease with some radiculopathy that has been well controlled lately. He has been feeling nauseated and somewhat poorly since starting Victoza after seeing his PCP last month. Home blood sugars during this time are down to the low 100s, from previous around 150s, without any extremes. He is having multiple bowel movements daily but denies any watery diarrhea. He denies any vomiting from this nausea, just decreased eating.  See problem based assessment and plan below for additional details.  Weakness of both lower extremities He describes a an acute onset of leg weakness he describes as wobbly or drunken.  On exam there is some mild proximal muscle group weakness but otherwise strength is intact.  Reflexes are intact and symmetric.  His gait is slightly wide and short steps.  This exam is reassuring against severe neurologic impingement or injury.  The only recent change was starting his Victoza but he has not recorded any glycemic events since this.  Muscle weakness would be a very rare side effect of starting a GLP-1 agonist.  It is possible the GI upset it is causing has led to some poor intake or hypokalemia which could explain weakness. Plan: Check BMP and CK today Recommended holding Victoza Follow-up in 1 week   Past Medical History:  Diagnosis Date  . Back pain, chronic   . Diabetes mellitus 2007  . Hepatitis C    [id number]  . Hypertension goal BP (blood pressure) < 140/80   . Microcytic anemia   . Neuromuscular disorder ([place])   . PAD (peripheral artery disease) ([place])   . Pancreatitis 01/09/2012    Review of Systems: Review of Systems  Respiratory: Negative for shortness of breath.   Cardiovascular: Negative for claudication and leg swelling.  Gastrointestinal:  Positive for nausea.  Musculoskeletal: Negative for falls.  Neurological: Positive for focal weakness. Negative for sensory change.     Physical Exam: Vitals:   04/30/18 1105  BP: 137/88  Pulse: 79  Temp: 97.8 F (36.6 C)  TempSrc: Oral  SpO2: 100%  Weight: 191 lb 6.4 oz (86.8 kg)  Height: 5\' 9"  (1.753 m)   GENERAL- alert, co-operative, NAD HEENT- Atraumatic, oral mucosa appears moist CARDIAC- RRR, no murmurs, rubs or gallops. RESP- CTAB, no wheezes or crackles. ABDOMEN- Soft, nontender BACK- Normal curvature, no paraspinal tenderness NEURO- Patellar reflexes intact bilaterally, 4/5 hip and proximal leg strength with 5/5 distal leg strength, normal sensation throughout legs and feet EXTREMITIES- symmetric, no pedal edema. SKIN- Warm, dry PSYCH- Normal mood and affect, appropriate thought content and speech.   Assessment & Plan:   See encounters tab for problem based medical decision making.   Patient discussed with Dr. Angelia Mould

## 2018-05-01 ENCOUNTER — Other Ambulatory Visit: Payer: Self-pay | Admitting: Cardiology

## 2018-05-04 NOTE — Assessment & Plan Note (Addendum)
He describes a an acute onset of leg weakness he describes as wobbly or drunken.  On exam there is some mild proximal muscle group weakness but otherwise strength is intact.  Reflexes are intact and symmetric.  His gait is slightly wide and short steps.  This exam is reassuring against severe neurologic impingement or injury.  The only recent change was starting his Victoza but he has not recorded any glycemic events since this.  Muscle weakness would be a very rare side effect of starting a GLP-1 agonist.  It is possible the GI upset it is causing has led to some poor intake or hypokalemia which could explain weakness. Plan: Check BMP and CK today Recommended holding Victoza Follow-up in 1 week

## 2018-05-05 ENCOUNTER — Telehealth: Payer: Self-pay | Admitting: Internal Medicine

## 2018-05-05 ENCOUNTER — Other Ambulatory Visit: Payer: Self-pay | Admitting: Cardiology

## 2018-05-05 NOTE — Progress Notes (Signed)
Internal Medicine Clinic Attending  Case discussed with Dr. Rice at the time of the visit.  We reviewed the resident's history and exam and pertinent patient test results.  I agree with the assessment, diagnosis, and plan of care documented in the resident's note.  

## 2018-05-05 NOTE — Telephone Encounter (Signed)
Rx has been sent to the pharmacy electronically. ° °

## 2018-05-05 NOTE — Telephone Encounter (Signed)
Patient is calling about lab results, patient said he called last week and no one called back

## 2018-05-06 ENCOUNTER — Encounter: Payer: Self-pay | Admitting: Physical Therapy

## 2018-05-06 ENCOUNTER — Ambulatory Visit: Payer: Medicare HMO | Admitting: Physical Therapy

## 2018-05-06 DIAGNOSIS — M5441 Lumbago with sciatica, right side: Principal | ICD-10-CM

## 2018-05-06 DIAGNOSIS — M6281 Muscle weakness (generalized): Secondary | ICD-10-CM | POA: Diagnosis not present

## 2018-05-06 DIAGNOSIS — G8929 Other chronic pain: Secondary | ICD-10-CM

## 2018-05-06 DIAGNOSIS — R262 Difficulty in walking, not elsewhere classified: Secondary | ICD-10-CM | POA: Diagnosis not present

## 2018-05-06 NOTE — Telephone Encounter (Addendum)
I called [name] back again this morning, left VM to return call.  [name] called back promptly and I discussed normal lab results without evidence of hypokalemia or elevated creatinine kinase. He has improvement in leg strength since stopping the Victoza. I recommended he remain off the medicine otherwise no new change in treatment today. He expressed a good understanding of this plan.

## 2018-05-06 NOTE — Therapy (Signed)
[facility] [address]  [place], [place], [address] Phone: [phone]   Fax:  [phone]  Physical Therapy Treatment  Patient Details  Name: Donald Palmer MRN: [id number] Date of Birth: 03-Aug-1961 Referring Provider: Larey Dresser, MD   Encounter Date: 05/06/2018  PT End of Session - 05/06/18 1714    Visit Number  2    Number of Visits  12    Date for PT Re-Evaluation  06/06/18    Authorization Type  [employer] Medicare HMO + Medicaid    PT Start Time  6606 Pt arrived late    PT Stop Time  1749    PT Time Calculation (min)  35 min    Activity Tolerance  Patient tolerated treatment well    Behavior During Therapy  [facility] for tasks assessed/performed       Past Medical History:  Diagnosis Date  . Back pain, chronic   . Diabetes mellitus 2007  . Hepatitis C    [id number]  . Hypertension goal BP (blood pressure) < 140/80   . Microcytic anemia   . Neuromuscular disorder ([place])   . PAD (peripheral artery disease) ([place])   . Pancreatitis 01/09/2012    Past Surgical History:  Procedure Laterality Date  . ABDOMINAL AORTOGRAM N/A 03/03/2018   Procedure: ABDOMINAL AORTOGRAM;  Surgeon: Lorretta Harp, MD;  Location: [place] [facility];  Service: Cardiovascular;  Laterality: N/A;  . I&D EXTREMITY Right 05/15/2016   Procedure: RIGHT INDEX FINGER FIRST THROUGH  MIDDLE PHALYNX  AMPUTATION;  Surgeon: [name], MD;  Location: [place];  Service: Orthopedics;  Laterality: Right;  . LOWER EXTREMITY INTERVENTION Bilateral 03/03/2018   Procedure: LOWER EXTREMITY INTERVENTION;  Surgeon: Lorretta Harp, MD;  Location: [place] [facility];  Service: Cardiovascular;  Laterality: Bilateral;  . OPEN REDUCTION INTERNAL FIXATION (ORIF) DISTAL PHALANX Right 04/02/2016   Procedure: RIGHT INDEX FINGER REPAIR;  Surgeon: [name], MD;  Location: [place];  Service: Orthopedics;  Laterality: Right;  . PERIPHERAL  VASCULAR ATHERECTOMY Left 03/03/2018   Procedure: PERIPHERAL VASCULAR ATHERECTOMY;  Surgeon: Lorretta Harp, MD;  Location: [place] [facility];  Service: Cardiovascular;  Laterality: Left;  SFA WITH PTA DRUG COATED BALLOON    There were no vitals filed for this visit.  Subjective Assessment - 05/06/18 1715    Subjective  Pt reporting he had a MD visit earlier this week due to worsening LE weakness - all testing came back normal. States he received a call from [facility] stating he could get a home TENS unit for $50 which is to be shipped to his home and the [facility] rep, Larene Beach, will meet with him to train him in set-up and use of the device.    Patient Stated Goals  Decrease Pain in Back    Currently in Pain?  Yes    Pain Score  7     Pain Location  Leg    Pain Orientation  Left;Right;Lower    Pain Descriptors / Indicators  Tiring    Pain Type  Chronic pain                       [facility] Adult PT Treatment/Exercise - 05/06/18 1714      Exercises   Exercises  Lumbar      Lumbar Exercises: Stretches   Active Hamstring Stretch  30 seconds;2 reps;Right;Left    Single Knee to Chest Stretch  30  seconds;2 reps;Right;Left    Double Knee to Chest Stretch  10 seconds;5 reps    Double Knee to Chest Stretch Limitations  heels on green Pball    Lower Trunk Rotation  10 seconds;5 reps    Piriformis Stretch  30 seconds;2 reps;Right;Left each    Piriformis Stretch Limitations  seated w/ hip hinge & supine KTOS    Other Lumbar Stretch Exercise  Seated 3 way prayer stretch with green Pball 2x30" each (pt reporting he completes this stretch leaning over table at home)      Lumbar Exercises: Supine   Pelvic Tilt  10 reps;5 seconds    Bridge  10 reps;5 seconds    Bridge Limitations  cues to avoid holding breath             PT Education - 05/06/18 1749    Education provided  Yes    Education Details  HEP update    Person(s) Educated  Patient    Methods   Explanation;Demonstration;Handout    Comprehension  Verbalized understanding;Returned demonstration       PT Short Term Goals - 05/06/18 1719      PT SHORT TERM GOAL #1   Title  Indepedent w/ Initial HEP    Status  On-going      PT SHORT TERM GOAL #2   Title  Perform gait assessment to ensure patient is utilizing [place] appropriately    Status  On-going      PT SHORT TERM GOAL #3   Title  Pt. will report understanding of proper posture/body mechanics to prevent LBP    Status  On-going        PT Long Term Goals - 05/06/18 1720      PT LONG TERM GOAL #1   Title  Indepedent w/ ongoing HEP    Status  On-going      PT LONG TERM GOAL #2   Title  Pt. will demonstrate >/= to 4/5 strength in R Hip musculature to improve proximal stability     Status  On-going      PT LONG TERM GOAL #3   Title  Pt. will demonstrate Lumbar ROM WFL    Status  On-going      PT LONG TERM GOAL #4   Title  Pt. will report increased tolerance for standing >/= 20 minutes w/o LBP     Status  On-going            Plan - 05/06/18 1720    Clinical Impression Statement  Initial HEP reviewed with pt requiring cues to avoid bouncing during stretches, but otherwise minimal corrections necessary. Pt reporting poor tolerance for prone exercises with prior therapy session, therefore focused on flexibility and initial lumbopelvic strengthening in hooklying with pt reporting good tolerance and requesting instructions for completion of exercises at home. Treatment time limited due to late arrival and pt declined heat/estim to end session today.    Rehab Potential  Good    PT Treatment/Interventions  ADLs/Self Care Home Management;Cryotherapy;Moist Heat;Traction;Ultrasound;Electrical Stimulation;Iontophoresis 4mg /ml Dexamethasone;DME Instruction;Gait training;Stair training;Functional mobility training;Therapeutic activities;Therapeutic exercise;Balance training;Neuromuscular re-education;Patient/family education;Manual  techniques;Taping;Dry needling;Passive range of motion    Consulted and Agree with Plan of Care  Patient       Patient will benefit from skilled therapeutic intervention in order to improve the following deficits and impairments:  Abnormal gait, Decreased balance, Decreased endurance, Decreased range of motion, Difficulty walking, Increased muscle spasms, Pain, Postural dysfunction, Impaired flexibility, Decreased strength, Decreased knowledge of use of DME,  Decreased activity tolerance  Visit Diagnosis: Chronic midline low back pain with right-sided sciatica  Difficulty in walking, not elsewhere classified  Muscle weakness (generalized)     Problem List Patient Active Problem List   Diagnosis Date Noted  . Weakness of both lower extremities 04/30/2018  . Claudication in peripheral vascular disease ([place]) 03/03/2018  . PAD (peripheral artery disease) ([place]) 01/29/2018  . Vitamin D deficiency 12/24/2017  . Iron deficiency 12/27/2016  . Erectile dysfunction associated with type 2 diabetes mellitus ([place]) 03/01/2016  . Atherosclerosis of aorta ([place]) 03/30/2015  . Chronic pain syndrome 03/18/2015  . Hyperlipidemia associated with type 2 diabetes mellitus ([place]) 12/22/2014  . Allergic rhinitis 10/22/2014  . Preventative health care 12/08/2012  . Lumbar radiculitis 09/16/2012  . GERD (gastroesophageal reflux disease) 07/08/2012  . History of chronic hepatitis C 05/13/2012  . Diabetic polyradiculopathy associated with type 2 diabetes mellitus ([place]) 04/30/2012  . History of hypertension 01/30/2012  . Type 2 diabetes mellitus with diabetic neuropathy ([place]) 12/17/2001    Percival Spanish, PT, MPT 05/06/2018, 6:18 PM  [facility] [address]  [place], [place], [address] Phone: [phone]   Fax:  [phone]  Name: Donald Palmer MRN: [id number] Date of Birth: 18-Nov-1961

## 2018-05-08 ENCOUNTER — Ambulatory Visit: Payer: Medicare HMO

## 2018-05-08 DIAGNOSIS — M6281 Muscle weakness (generalized): Secondary | ICD-10-CM

## 2018-05-08 DIAGNOSIS — M5441 Lumbago with sciatica, right side: Secondary | ICD-10-CM | POA: Diagnosis not present

## 2018-05-08 DIAGNOSIS — G8929 Other chronic pain: Secondary | ICD-10-CM

## 2018-05-08 DIAGNOSIS — R262 Difficulty in walking, not elsewhere classified: Secondary | ICD-10-CM

## 2018-05-08 NOTE — Patient Instructions (Signed)

## 2018-05-08 NOTE — Therapy (Signed)
[facility] [address]  [place], [place], [address] Phone: [phone]   Fax:  [phone]  Physical Therapy Treatment  Patient Details  Name: Donald Palmer MRN: [id number] Date of Birth: [birth year] Referring Provider: Larey Dresser, MD   Encounter Date: 05/08/2018  PT End of Session - 05/08/18 1553    Visit Number  3    Number of Visits  12    Date for PT Re-Evaluation  06/06/18    Authorization Type  [employer] Medicare HMO + Medicaid    PT Start Time  1543 Pt. arrived late     PT Stop Time  1614    PT Time Calculation (min)  31 min    Activity Tolerance  Patient tolerated treatment well    Behavior During Therapy  [facility] for tasks assessed/performed       Past Medical History:  Diagnosis Date  . Back pain, chronic   . Diabetes mellitus 2007  . Hepatitis C    [id number]  . Hypertension goal BP (blood pressure) < 140/80   . Microcytic anemia   . Neuromuscular disorder ([place])   . PAD (peripheral artery disease) ([place])   . Pancreatitis 01/09/2012    Past Surgical History:  Procedure Laterality Date  . ABDOMINAL AORTOGRAM N/A 03/03/2018   Procedure: ABDOMINAL AORTOGRAM;  Surgeon: Lorretta Harp, MD;  Location: [place] [facility];  Service: Cardiovascular;  Laterality: N/A;  . I&D EXTREMITY Right 05/15/2016   Procedure: RIGHT INDEX FINGER FIRST THROUGH  MIDDLE PHALYNX  AMPUTATION;  Surgeon: [name], MD;  Location: [place];  Service: Orthopedics;  Laterality: Right;  . LOWER EXTREMITY INTERVENTION Bilateral 03/03/2018   Procedure: LOWER EXTREMITY INTERVENTION;  Surgeon: Lorretta Harp, MD;  Location: [place] [facility];  Service: Cardiovascular;  Laterality: Bilateral;  . OPEN REDUCTION INTERNAL FIXATION (ORIF) DISTAL PHALANX Right 04/02/2016   Procedure: RIGHT INDEX FINGER REPAIR;  Surgeon: [name], MD;  Location: [place];  Service: Orthopedics;  Laterality: Right;  . PERIPHERAL  VASCULAR ATHERECTOMY Left 03/03/2018   Procedure: PERIPHERAL VASCULAR ATHERECTOMY;  Surgeon: Lorretta Harp, MD;  Location: [place] [facility];  Service: Cardiovascular;  Laterality: Left;  SFA WITH PTA DRUG COATED BALLOON    There were no vitals filed for this visit.  Subjective Assessment - 05/08/18 1547    Subjective  Pt. reporting he has been "working on cars today".  Pt. reporting, "the disks are pinching on the nerves".      Patient Stated Goals  Decrease Pain in Back    Currently in Pain?  Yes    Pain Score  8     Pain Location  Leg    Pain Orientation  Left;Right    Pain Descriptors / Indicators  Throbbing    Pain Type  Chronic pain    Pain Radiating Towards  Ratiates down into R buttocks     Pain Onset  More than a month ago    Pain Frequency  Constant    Aggravating Factors   sitting, standing     Pain Relieving Factors  TENS     Multiple Pain Sites  No                       [facility] Adult PT Treatment/Exercise - 05/08/18 1604      Ambulation/Gait   Ambulation/Gait  Yes    Ambulation/Gait Assistance  5: Supervision    Ambulation  Distance (Feet)  180 Feet    Gait Pattern  Step-through pattern;Decreased stride length    Gait Comments  Pt. ambulating without signficant gait abnormality however did require cueing for use of SPC for proper sequencing as pt. notes "I need to walk with my cane to keep from falling I just left it in the car because I was working on cars today".        Lumbar Exercises: Stretches   Single Knee to Chest Stretch  30 seconds;2 reps;Right;Left      Lumbar Exercises: Aerobic   Nustep  Lvl 5, 6 min       Lumbar Exercises: Supine   Pelvic Tilt  5 seconds;15 reps    Pelvic Tilt Limitations  Cues required for proper motion     Bridge  15 reps;3 seconds    Bridge Limitations  cues to avoid holding breath             PT Education - 05/08/18 1817    Education provided  Yes    Education Details  Posture and body mechanics  handout     Person(s) Educated  Patient    Methods  Explanation;Demonstration;Verbal cues;Handout    Comprehension  Verbalized understanding;Returned demonstration;Verbal cues required;Need further instruction       PT Short Term Goals - 05/06/18 1719      PT SHORT TERM GOAL #1   Title  Indepedent w/ Initial HEP    Status  On-going      PT SHORT TERM GOAL #2   Title  Perform gait assessment to ensure patient is utilizing [facility] appropriately    Status  On-going      PT SHORT TERM GOAL #3   Title  Pt. will report understanding of proper posture/body mechanics to prevent LBP    Status  On-going        PT Long Term Goals - 05/06/18 1720      PT LONG TERM GOAL #1   Title  Indepedent w/ ongoing HEP    Status  On-going      PT LONG TERM GOAL #2   Title  Pt. will demonstrate >/= to 4/5 strength in R Hip musculature to improve proximal stability     Status  On-going      PT LONG TERM GOAL #3   Title  Pt. will demonstrate Lumbar ROM WFL    Status  On-going      PT LONG TERM GOAL #4   Title  Pt. will report increased tolerance for standing >/= 20 minutes w/o LBP     Status  On-going            Plan - 05/08/18 1556    Clinical Impression Statement  Pt. arrived late to session today thus treatment time limited.  Treatment focusing on gentle lumbopelvic strengthening and stretching of proximal hip musculature as pt. noting relief from pain with these activities.  Did review proper body mechanics with daily/household tasks and proper lifting technique as to reduce lumbar strain.  Pt. verbalized understanding however will likely benefit from further skilled instruction with this.  Pt. seen in session today without his SPC and ambulating with good overall stability.  Pt. he "normally", ambulates with SPC because he needs this to keep from falling.  Pt. noting, "My pain is coming from pinched nerves and I don't know why they won't just do surgery."  Pt. movement patterns and behavior in  today's visit inconsistent with high reported pain levels.  Will  continue to progress toward goals in coming visits.      PT Treatment/Interventions  ADLs/Self Care Home Management;Cryotherapy;Moist Heat;Traction;Ultrasound;Electrical Stimulation;Iontophoresis 4mg /ml Dexamethasone;DME Instruction;Gait training;Stair training;Functional mobility training;Therapeutic activities;Therapeutic exercise;Balance training;Neuromuscular re-education;Patient/family education;Manual techniques;Taping;Dry needling;Passive range of motion    Consulted and Agree with Plan of Care  Patient       Patient will benefit from skilled therapeutic intervention in order to improve the following deficits and impairments:  Abnormal gait, Decreased balance, Decreased endurance, Decreased range of motion, Difficulty walking, Increased muscle spasms, Pain, Postural dysfunction, Impaired flexibility, Decreased strength, Decreased knowledge of use of DME, Decreased activity tolerance  Visit Diagnosis: Chronic midline low back pain with right-sided sciatica  Difficulty in walking, not elsewhere classified  Muscle weakness (generalized)     Problem List Patient Active Problem List   Diagnosis Date Noted  . Weakness of both lower extremities 04/30/2018  . Claudication in peripheral vascular disease ([place]) 03/03/2018  . PAD (peripheral artery disease) ([place]) 01/29/2018  . Vitamin D deficiency 12/24/2017  . Iron deficiency 12/27/2016  . Erectile dysfunction associated with type 2 diabetes mellitus ([place]) 03/01/2016  . Atherosclerosis of aorta ([place]) 03/30/2015  . Chronic pain syndrome 03/18/2015  . Hyperlipidemia associated with type 2 diabetes mellitus ([place]) 12/22/2014  . Allergic rhinitis 10/22/2014  . Preventative health care 12/08/2012  . Lumbar radiculitis 09/16/2012  . GERD (gastroesophageal reflux disease) 07/08/2012  . History of chronic hepatitis C 05/13/2012  . Diabetic polyradiculopathy associated with type 2  diabetes mellitus ([place]) 04/30/2012  . History of hypertension 01/30/2012  . Type 2 diabetes mellitus with diabetic neuropathy ([place]) 12/17/2001    Bess Harvest, PTA 05/08/18 6:30 PM  [facility] [address]  [place], [place], [address] Phone: [phone]   Fax:  [phone]  Name: Donald Palmer MRN: [id number] Date of Birth: [birth year]

## 2018-05-13 ENCOUNTER — Ambulatory Visit: Payer: Medicare HMO

## 2018-05-15 ENCOUNTER — Ambulatory Visit: Payer: Medicare HMO

## 2018-05-15 DIAGNOSIS — R262 Difficulty in walking, not elsewhere classified: Secondary | ICD-10-CM

## 2018-05-15 DIAGNOSIS — M5441 Lumbago with sciatica, right side: Secondary | ICD-10-CM | POA: Diagnosis not present

## 2018-05-15 DIAGNOSIS — G8929 Other chronic pain: Secondary | ICD-10-CM | POA: Diagnosis not present

## 2018-05-15 DIAGNOSIS — M6281 Muscle weakness (generalized): Secondary | ICD-10-CM

## 2018-05-15 NOTE — Therapy (Signed)
[facility] [address]  [place], [place], [address] Phone: [phone]   Fax:  [phone]  Physical Therapy Treatment  Patient Details  Name: Donald Palmer MRN: [id number] Date of Birth: 1961-10-06 Referring Provider: Larey Dresser, MD   Encounter Date: 05/15/2018  PT End of Session - 05/15/18 1544    Visit Number  4    Number of Visits  12    Date for PT Re-Evaluation  06/06/18    Authorization Type  [employer] Medicare HMO + Medicaid    PT Start Time  1751    PT Stop Time  1631    PT Time Calculation (min)  53 min    Activity Tolerance  Patient tolerated treatment well    Behavior During Therapy  [facility] for tasks assessed/performed       Past Medical History:  Diagnosis Date  . Back pain, chronic   . Diabetes mellitus 2007  . Hepatitis C    [id number]  . Hypertension goal BP (blood pressure) < 140/80   . Microcytic anemia   . Neuromuscular disorder ([place])   . PAD (peripheral artery disease) ([place])   . Pancreatitis 01/09/2012    Past Surgical History:  Procedure Laterality Date  . ABDOMINAL AORTOGRAM N/A 03/03/2018   Procedure: ABDOMINAL AORTOGRAM;  Surgeon: Lorretta Harp, MD;  Location: [place] [facility];  Service: Cardiovascular;  Laterality: N/A;  . I&D EXTREMITY Right 05/15/2016   Procedure: RIGHT INDEX FINGER FIRST THROUGH  MIDDLE PHALYNX  AMPUTATION;  Surgeon: [name], MD;  Location: [place];  Service: Orthopedics;  Laterality: Right;  . LOWER EXTREMITY INTERVENTION Bilateral 03/03/2018   Procedure: LOWER EXTREMITY INTERVENTION;  Surgeon: Lorretta Harp, MD;  Location: [place] [facility];  Service: Cardiovascular;  Laterality: Bilateral;  . OPEN REDUCTION INTERNAL FIXATION (ORIF) DISTAL PHALANX Right 04/02/2016   Procedure: RIGHT INDEX FINGER REPAIR;  Surgeon: [name], MD;  Location: [place];  Service: Orthopedics;  Laterality: Right;  . PERIPHERAL VASCULAR ATHERECTOMY  Left 03/03/2018   Procedure: PERIPHERAL VASCULAR ATHERECTOMY;  Surgeon: Lorretta Harp, MD;  Location: [place] [facility];  Service: Cardiovascular;  Laterality: Left;  SFA WITH PTA DRUG COATED BALLOON    There were no vitals filed for this visit.  Subjective Assessment - 05/15/18 1541    Subjective  Pt. noting, "My disks are damaged".      Patient Stated Goals  Decrease Pain in Back    Currently in Pain?  Yes    Pain Score  8     Pain Location  Buttocks    Pain Orientation  Right    Pain Descriptors / Indicators  Stabbing;Sharp    Pain Type  Chronic pain    Pain Radiating Towards  Radiates into R lower back    Pain Onset  More than a month ago    Pain Frequency  Constant    Aggravating Factors   Prolonged walking     Multiple Pain Sites  No                       [facility] Adult PT Treatment/Exercise - 05/15/18 1552      Self-Care   Self-Care  Other Self-Care Comments    Other Self-Care Comments   Self-ball release to R glute with ball on wall; pt. noting some relief with this      Lumbar Exercises: Stretches   Single Knee to Chest  Stretch  30 seconds;2 reps;Right;Left Pt. noting good relief with this stretch     Hip Flexor Stretch  Right;1 rep;60 seconds      Lumbar Exercises: Aerobic   Nustep  Lvl 5, 7 min       Lumbar Exercises: Supine   Clam  10 reps;3 seconds    Clam Limitations  with red looped TB at knees     Bent Knee Raise  10 reps;3 seconds    Bent Knee Raise Limitations  with red looped TB at knees     Bridge  15 reps;3 seconds    Bridge Limitations  cues to avoid holding breath    Bridge with clamshell  10 reps with hip abd/ER isometrics into red looped TB at knees     Other Supine Lumbar Exercises  Hooklying adduction ball squeeze with abdom. brace 5" x 10 reps       Moist Heat Therapy   Number Minutes Moist Heat  15 Minutes    Moist Heat Location  Lumbar Spine      Electrical Stimulation   Electrical Stimulation Location  Lumbar Spine     Electrical Stimulation Action  IFC    Electrical Stimulation Parameters  to tolerance, 15'    Electrical Stimulation Goals  Pain      Manual Therapy   Manual Therapy  Soft tissue mobilization;Myofascial release    Manual therapy comments  L sidelying with R LE elevated on bolster     Soft tissue mobilization  STM to R buttocks in area of tenderness     Myofascial Release  TPR to R superior buttocks in area of tenderness               PT Short Term Goals - 05/15/18 1549      PT SHORT TERM GOAL #1   Title  Indepedent w/ Initial HEP    Status  Achieved      PT SHORT TERM GOAL #2   Title  Perform gait assessment to ensure patient is utilizing [facility] appropriately    Status  Achieved      PT SHORT TERM GOAL #3   Title  Pt. will report understanding of proper posture/body mechanics to prevent LBP    Status  On-going        PT Long Term Goals - 05/06/18 1720      PT LONG TERM GOAL #1   Title  Indepedent w/ ongoing HEP    Status  On-going      PT LONG TERM GOAL #2   Title  Pt. will demonstrate >/= to 4/5 strength in R Hip musculature to improve proximal stability     Status  On-going      PT LONG TERM GOAL #3   Title  Pt. will demonstrate Lumbar ROM WFL    Status  On-going      PT LONG TERM GOAL #4   Title  Pt. will report increased tolerance for standing >/= 20 minutes w/o LBP     Status  On-going            Plan - 05/15/18 1545    Clinical Impression Statement  Pt. reporting increased LBP today.  Pt. movement pattern not consistent with high reported pain levels.  Tolerated advancement in lumbopelvic strengthening activities well today in session.  Did encourage pt. to reach out to TENS rep with phone contact # provided to pt. as pt. still has not received TENS unit.  Pt. ended session  with E-stim/moist heat to lumbar spine to decrease post-exercise pain.  Will continue to progress toward goals.    PT Treatment/Interventions  ADLs/Self Care Home  Management;Cryotherapy;Moist Heat;Traction;Ultrasound;Electrical Stimulation;Iontophoresis 4mg /ml Dexamethasone;DME Instruction;Gait training;Stair training;Functional mobility training;Therapeutic activities;Therapeutic exercise;Balance training;Neuromuscular re-education;Patient/family education;Manual techniques;Taping;Dry needling;Passive range of motion    Consulted and Agree with Plan of Care  Patient       Patient will benefit from skilled therapeutic intervention in order to improve the following deficits and impairments:  Abnormal gait, Decreased balance, Decreased endurance, Decreased range of motion, Difficulty walking, Increased muscle spasms, Pain, Postural dysfunction, Impaired flexibility, Decreased strength, Decreased knowledge of use of DME, Decreased activity tolerance  Visit Diagnosis: Chronic midline low back pain with right-sided sciatica  Difficulty in walking, not elsewhere classified  Muscle weakness (generalized)     Problem List Patient Active Problem List   Diagnosis Date Noted  . Weakness of both lower extremities 04/30/2018  . Claudication in peripheral vascular disease ([place]) 03/03/2018  . PAD (peripheral artery disease) ([place]) 01/29/2018  . Vitamin D deficiency 12/24/2017  . Iron deficiency 12/27/2016  . Erectile dysfunction associated with type 2 diabetes mellitus ([place]) 03/01/2016  . Atherosclerosis of aorta ([place]) 03/30/2015  . Chronic pain syndrome 03/18/2015  . Hyperlipidemia associated with type 2 diabetes mellitus ([place]) 12/22/2014  . Allergic rhinitis 10/22/2014  . Preventative health care 12/08/2012  . Lumbar radiculitis 09/16/2012  . GERD (gastroesophageal reflux disease) 07/08/2012  . History of chronic hepatitis C 05/13/2012  . Diabetic polyradiculopathy associated with type 2 diabetes mellitus ([place]) 04/30/2012  . History of hypertension 01/30/2012  . Type 2 diabetes mellitus with diabetic neuropathy ([place]) 12/17/2001    Bess Harvest,  PTA 05/15/18 5:25 PM  [facility] [address]  [place], [place], [address] Phone: [phone]   Fax:  [phone]  Name: Donald Palmer MRN: [id number] Date of Birth: Mar 03, 1961

## 2018-05-20 ENCOUNTER — Ambulatory Visit: Payer: Medicare HMO | Admitting: Physical Therapy

## 2018-05-22 ENCOUNTER — Ambulatory Visit: Payer: Medicare HMO

## 2018-05-23 ENCOUNTER — Other Ambulatory Visit: Payer: Self-pay | Admitting: *Deleted

## 2018-05-23 DIAGNOSIS — I739 Peripheral vascular disease, unspecified: Secondary | ICD-10-CM

## 2018-05-27 ENCOUNTER — Ambulatory Visit: Payer: Medicare HMO

## 2018-05-29 ENCOUNTER — Ambulatory Visit: Payer: Medicare HMO | Attending: Internal Medicine | Admitting: Physical Therapy

## 2018-05-29 DIAGNOSIS — M6281 Muscle weakness (generalized): Secondary | ICD-10-CM

## 2018-05-29 DIAGNOSIS — G8929 Other chronic pain: Secondary | ICD-10-CM | POA: Diagnosis not present

## 2018-05-29 DIAGNOSIS — M5441 Lumbago with sciatica, right side: Secondary | ICD-10-CM | POA: Diagnosis not present

## 2018-05-29 DIAGNOSIS — R262 Difficulty in walking, not elsewhere classified: Secondary | ICD-10-CM | POA: Insufficient documentation

## 2018-05-29 NOTE — Therapy (Signed)
[facility] [address]  [place], [place], [address] Phone: [phone]   Fax:  [phone]  Physical Therapy Treatment  Patient Details  Name: Donald Palmer MRN: [id number] Date of Birth: 1961/12/11 Referring Provider: Larey Dresser, MD   Encounter Date: 05/29/2018  PT End of Session - 05/29/18 1622    Visit Number  5    Number of Visits  12    Date for PT Re-Evaluation  06/06/18    Authorization Type  [employer] Medicare HMO + Medicaid    PT Start Time  1622    PT Stop Time  1646    PT Time Calculation (min)  24 min    Activity Tolerance  Patient tolerated treatment well    Behavior During Therapy  Agitated       Past Medical History:  Diagnosis Date  . Back pain, chronic   . Diabetes mellitus 2007  . Hepatitis C    [id number]  . Hypertension goal BP (blood pressure) < 140/80   . Microcytic anemia   . Neuromuscular disorder ([place])   . PAD (peripheral artery disease) ([place])   . Pancreatitis 01/09/2012    Past Surgical History:  Procedure Laterality Date  . ABDOMINAL AORTOGRAM N/A 03/03/2018   Procedure: ABDOMINAL AORTOGRAM;  Surgeon: Lorretta Harp, MD;  Location: [facility];  Service: Cardiovascular;  Laterality: N/A;  . I&D EXTREMITY Right 05/15/2016   Procedure: RIGHT INDEX FINGER FIRST THROUGH  MIDDLE PHALYNX  AMPUTATION;  Surgeon: [name], MD;  Location: [facility];  Service: Orthopedics;  Laterality: Right;  . LOWER EXTREMITY INTERVENTION Bilateral 03/03/2018   Procedure: LOWER EXTREMITY INTERVENTION;  Surgeon: Lorretta Harp, MD;  Location: [facility];  Service: Cardiovascular;  Laterality: Bilateral;  . OPEN REDUCTION INTERNAL FIXATION (ORIF) DISTAL PHALANX Right 04/02/2016   Procedure: RIGHT INDEX FINGER REPAIR;  Surgeon: [name], MD;  Location: [facility];  Service: Orthopedics;  Laterality: Right;  . PERIPHERAL VASCULAR ATHERECTOMY Left 03/03/2018   Procedure: PERIPHERAL VASCULAR ATHERECTOMY;  Surgeon: Lorretta Harp, MD;  Location: [facility];  Service: Cardiovascular;  Laterality: Left;  SFA WITH PTA DRUG COATED BALLOON    There were no vitals filed for this visit.  Subjective Assessment - 05/29/18 1628    Subjective  Pt arriving to PT stating "I don't want to do PT. I've got nerve damage and if I do anything, my body just wants to shut down." Keeps repeating "it shuts me down."    Limitations  Standing    How long can you sit comfortably?  1 hour    How long can you stand comfortably?  ~5 minutes    Patient Stated Goals  Decrease Pain in Back    Currently in Pain?  Yes    Pain Score  6     Pain Location  Back    Pain Orientation  Lower    Pain Descriptors / Indicators  Tingling    Pain Type  Chronic pain    Pain Radiating Towards  down back of B legs to calves    Pain Onset  More than a month ago    Pain Frequency  Constant    Aggravating Factors   bending forward or squatting    Pain Relieving Factors  laying down, relaxing    Effect of Pain on Daily Activities  "it shuts my system down"  [facility] PT Assessment - 05/29/18 1622      Assessment   Medical Diagnosis  Low Back Pain + R Groin Pain      Observation/Other Assessments   Focus on Therapeutic Outcomes (FOTO)   Lumbar - 44% (56% Limitation)      AROM   Lumbar Flexion  hands to ankles    Lumbar Extension  25% limitation    Lumbar - Right Side Bend  to knee joint line    Lumbar - Left Side Bend  to knee joint line pain    Lumbar - Right Rotation  WFL    Lumbar - Left Rotation  [facility]      Strength   Right Hip Flexion  5/5    Right Hip Extension  4+/5    Right Hip External Rotation   4+/5 pain in groin & buttock    Right Hip Internal Rotation  4+/5    Right Hip ABduction  4+/5    Right Hip ADduction  4+/5    Left Hip Flexion  5/5    Left Hip Extension  4+/5    Left Hip External Rotation  4+/5    Left Hip Internal Rotation  4+/5    Left Hip  ABduction  4+/5    Right Knee Flexion  4+/5    Right Knee Extension  5/5    Left Knee Flexion  4+/5    Left Knee Extension  5/5                             PT Short Term Goals - 05/29/18 1633      PT SHORT TERM GOAL #1   Title  Indepedent w/ Initial HEP    Status  Achieved      PT SHORT TERM GOAL #2   Title  Perform gait assessment to ensure patient is utilizing [facility] appropriately    Status  Achieved      PT SHORT TERM GOAL #3   Title  Pt. will report understanding of proper posture/body mechanics to prevent LBP    Status  Achieved        PT Long Term Goals - 05/29/18 1633      PT LONG TERM GOAL #1   Title  Indepedent w/ ongoing HEP    Status  Achieved      PT LONG TERM GOAL #2   Title  Pt. will demonstrate >/= to 4/5 strength in R Hip musculature to improve proximal stability     Status  Achieved      PT LONG TERM GOAL #3   Title  Pt. will demonstrate Lumbar ROM WFL    Status  Achieved      PT LONG TERM GOAL #4   Title  Pt. will report increased tolerance for standing >/= 20 minutes w/o LBP     Status  Not Met            Plan - 05/29/18 1649    Clinical Impression Statement  Donald Palmer arriving to PT today expressing a desire to discontinue physical therapy stating "you can do anything for nerve damage and that's what I've got ... it just shuts me down". Pt did acknowledge improvement in back pain, stating "you fixed my back pain, now it's just the nerve damage in my legs". Pt not open to education in role of PT in addressing radicular pain and insisting that there is nothing PT can do for  him. Goals assessed and pt was able to achieve all goals except standing tolerance, but states limitation is now due to "nerve pain" rather "back pain". Will proceed with discharge from PT per patient request.    PT Treatment/Interventions  ADLs/Self Care Home Management;Cryotherapy;Moist Heat;Traction;Ultrasound;Electrical Stimulation;Iontophoresis 26m/ml  Dexamethasone;DME Instruction;Gait training;Stair training;Functional mobility training;Therapeutic activities;Therapeutic exercise;Balance training;Neuromuscular re-education;Patient/family education;Manual techniques;Taping;Dry needling;Passive range of motion    PT Next Visit Plan  Discharge    Consulted and Agree with Plan of Care  Patient       Patient will benefit from skilled therapeutic intervention in order to improve the following deficits and impairments:  Abnormal gait, Decreased balance, Decreased endurance, Decreased range of motion, Difficulty walking, Increased muscle spasms, Pain, Postural dysfunction, Impaired flexibility, Decreased strength, Decreased knowledge of use of DME, Decreased activity tolerance  Visit Diagnosis: Chronic midline low back pain with right-sided sciatica  Difficulty in walking, not elsewhere classified  Muscle weakness (generalized)     Problem List Patient Active Problem List   Diagnosis Date Noted  . Weakness of both lower extremities 04/30/2018  . Claudication in peripheral vascular disease ([place] 03/03/2018  . PAD (peripheral artery disease) ([place] 01/29/2018  . Vitamin D deficiency 12/24/2017  . Iron deficiency 12/27/2016  . Erectile dysfunction associated with type 2 diabetes mellitus ([facility] 03/01/2016  . Atherosclerosis of aorta ([facility] 03/30/2015  . Chronic pain syndrome 03/18/2015  . Hyperlipidemia associated with type 2 diabetes mellitus ([place] 12/22/2014  . Allergic rhinitis 10/22/2014  . Preventative health care 12/08/2012  . Lumbar radiculitis 09/16/2012  . GERD (gastroesophageal reflux disease) 07/08/2012  . History of chronic hepatitis C 05/13/2012  . Diabetic polyradiculopathy associated with type 2 diabetes mellitus ([place] 04/30/2012  . History of hypertension 01/30/2012  . Type 2 diabetes mellitus with diabetic neuropathy ([facility] 12/17/2001    PHYSICAL THERAPY DISCHARGE SUMMARY  Visits from Start of Care: 5  Current  functional level related to goals / functional outcomes:   Refer to above clinical impression.   Remaining deficits:   As above.   Education / Equipment:   HEP, pTraining and development officereducation  Plan: Patient agrees to discharge.  Patient goals were partially met. Patient is being discharged due to the patient's request.  ?????       Donald Palmer PT, MPT 05/29/2018, 5:10 PM  [facility] [place] [address]Phone: [phone]  Fax:  [phone] Name: Donald BRENDLEMRN: [id number] of Birth: 211-19-1962

## 2018-06-02 ENCOUNTER — Ambulatory Visit: Payer: Medicare HMO

## 2018-06-09 ENCOUNTER — Ambulatory Visit: Payer: Medicare HMO

## 2018-06-12 ENCOUNTER — Ambulatory Visit: Payer: Medicare HMO | Admitting: Physical Therapy

## 2018-06-25 ENCOUNTER — Other Ambulatory Visit: Payer: Self-pay | Admitting: Internal Medicine

## 2018-07-03 ENCOUNTER — Encounter: Payer: Medicare HMO | Admitting: Internal Medicine

## 2018-07-29 ENCOUNTER — Encounter: Payer: Self-pay | Admitting: Internal Medicine

## 2018-07-29 ENCOUNTER — Encounter (INDEPENDENT_AMBULATORY_CARE_PROVIDER_SITE_OTHER): Payer: Self-pay

## 2018-07-29 ENCOUNTER — Other Ambulatory Visit: Payer: Self-pay

## 2018-07-29 ENCOUNTER — Ambulatory Visit (INDEPENDENT_AMBULATORY_CARE_PROVIDER_SITE_OTHER): Payer: Medicare HMO | Admitting: Internal Medicine

## 2018-07-29 VITALS — BP 141/90 | HR 81 | Temp 98.1°F | Ht 69.0 in | Wt 183.5 lb

## 2018-07-29 DIAGNOSIS — M109 Gout, unspecified: Secondary | ICD-10-CM | POA: Insufficient documentation

## 2018-07-29 MED ORDER — COLCHICINE 0.6 MG PO TABS: 0.6000 mg | ORAL_TABLET | Freq: Every day | ORAL | 0 refills | Status: DC

## 2018-07-29 MED ORDER — KETOROLAC TROMETHAMINE 30 MG/ML IJ SOLN
30.0000 mg | Freq: Once | INTRAMUSCULAR | Status: AC
  Administered 2018-07-29: 30 mg via INTRAMUSCULAR

## 2018-07-29 NOTE — Progress Notes (Signed)
Internal Medicine Clinic Attending  I saw and evaluated the patient.  I personally confirmed the key portions of the history and exam documented by Dr. Eileen Stanford and I reviewed pertinent patient test results.  The assessment, diagnosis, and plan were formulated together and I agree with the documentation in the resident's note. As this is the pt's first gout attack and his renal fxn is nl, he does not need uric lowering therapy.

## 2018-07-29 NOTE — Progress Notes (Signed)
   CC: Acute right toe pain  HPI:  [name] is a 57 y.o. with past medical history as listed below presented with acute onset right toe pain.  He was in his usual state of health until 3 days ago when he began experiencing sharp pain of the right great toe.  He reports that he woke up in the morning with this episode.  He denies any alleviating factors and he tried soaking his leg in hydrogen peroxide without any relief.  The pain is worse with ambulation and touch.  He currently reports of worsening achy pain as he rates pain at 10/10, swelling and redness.  He also denies any radiating symptoms, fevers, chills, diaphoresis, hypertension, recent alcohol use (reports that he drinks occasionally).   Podagra: Acute onset 10/10 achy pain of the right great toe, worse with ambulation and touch.  Physical exam noticeable for swelling, erythematous, warm to touch tender to palpation.  It is unclear as to what could have precipitated his acute gout flare but however given his history of occasional alcohol use it is likely primitive cause. -s/p IM Toradol 30 mg -Given prescription for colchicine with these instructions --Colchicine 1.2 mg x 1 dose --Colchicine 0.6 mg 1 hour after the first dose --Colchicine 0.6 mg twice daily x 2days -Patient advised to decrease alcohol intake and increase hydration.  Past Medical History:  Diagnosis Date  . Back pain, chronic   . Diabetes mellitus 2007  . Hepatitis C    [id number]  . Hypertension goal BP (blood pressure) < 140/80   . Microcytic anemia   . Neuromuscular disorder ([place])   . PAD (peripheral artery disease) ([place])   . Pancreatitis 01/09/2012   Review of Systems: As per HPI  Physical Exam:  Vitals:   07/29/18 0930  BP: (!) 141/90  Pulse: 81  Temp: 98.1 F (36.7 C)  TempSrc: Oral  SpO2: 100%  Weight: 183 lb 8 oz (83.2 kg)  Height: 5\' 9"  (1.753 m)   Constitutional: In moderate distress secondary to right toe pain Cardiovascular:  Regular rate and rhythm, no murmurs, gallops, rubs Respiratory: Clear to auscultation bilaterally Extremity: Right great toe swollen, erythematous, warm to touch, tender to palpation  Assessment & Plan:   See Encounters Tab for problem based charting.  Patient discussed with Dr. Lynnae January

## 2018-07-29 NOTE — Assessment & Plan Note (Signed)
Podagra: Acute onset 10/10 achy pain of the right great toe, worse with ambulation and touch.  Physical exam noticeable for swelling, erythematous, warm to touch tender to palpation.  It is unclear as to what could have precipitated his acute gout flare but however given his history of occasional alcohol use it is likely primitive cause.  -s/p IM Toradol 30 mg -Given prescription for colchicine with these instructions --Colchicine 1.2 mg x 1 dose --Colchicine 0.6 mg 1 hour after the first dose --Colchicine 0.6 mg twice daily x 2days -Patient advised to decrease alcohol intake and increase hydration.

## 2018-07-29 NOTE — Patient Instructions (Signed)
[name],  It was a pleasure taking care of you here at the clinic and so you are going through so much pain.  I think you have an acute gout flare given you some medications for it.  1.  Given you a Toradol injection  2. I am also given you a medication called colchicine.  Here with instructions: -Take 2 pills on the first day -Take 1 pill an hour after the first dose -After the first day, take 1 pill twice a day for an additional 2 days  This should help with the pain I hope you feel better.  Dr. Eileen Stanford

## 2018-07-31 ENCOUNTER — Encounter: Payer: Self-pay | Admitting: Internal Medicine

## 2018-07-31 ENCOUNTER — Ambulatory Visit (INDEPENDENT_AMBULATORY_CARE_PROVIDER_SITE_OTHER): Payer: Medicare HMO | Admitting: Internal Medicine

## 2018-07-31 ENCOUNTER — Other Ambulatory Visit: Payer: Self-pay

## 2018-07-31 VITALS — BP 137/93 | HR 89 | Temp 98.5°F | Wt 177.0 lb

## 2018-07-31 DIAGNOSIS — G894 Chronic pain syndrome: Secondary | ICD-10-CM | POA: Diagnosis not present

## 2018-07-31 DIAGNOSIS — Z720 Tobacco use: Secondary | ICD-10-CM | POA: Diagnosis not present

## 2018-07-31 DIAGNOSIS — E1151 Type 2 diabetes mellitus with diabetic peripheral angiopathy without gangrene: Secondary | ICD-10-CM

## 2018-07-31 DIAGNOSIS — Z794 Long term (current) use of insulin: Secondary | ICD-10-CM

## 2018-07-31 DIAGNOSIS — Z79899 Other long term (current) drug therapy: Secondary | ICD-10-CM

## 2018-07-31 DIAGNOSIS — J3489 Other specified disorders of nose and nasal sinuses: Secondary | ICD-10-CM | POA: Diagnosis not present

## 2018-07-31 DIAGNOSIS — Z8679 Personal history of other diseases of the circulatory system: Secondary | ICD-10-CM

## 2018-07-31 DIAGNOSIS — I739 Peripheral vascular disease, unspecified: Secondary | ICD-10-CM

## 2018-07-31 DIAGNOSIS — R269 Unspecified abnormalities of gait and mobility: Secondary | ICD-10-CM | POA: Diagnosis not present

## 2018-07-31 DIAGNOSIS — E1140 Type 2 diabetes mellitus with diabetic neuropathy, unspecified: Secondary | ICD-10-CM

## 2018-07-31 DIAGNOSIS — Z7982 Long term (current) use of aspirin: Secondary | ICD-10-CM

## 2018-07-31 DIAGNOSIS — I10 Essential (primary) hypertension: Secondary | ICD-10-CM | POA: Diagnosis not present

## 2018-07-31 DIAGNOSIS — M109 Gout, unspecified: Secondary | ICD-10-CM | POA: Diagnosis not present

## 2018-07-31 DIAGNOSIS — E611 Iron deficiency: Secondary | ICD-10-CM

## 2018-07-31 LAB — GLUCOSE, CAPILLARY: Glucose-Capillary: 215 mg/dL — ABNORMAL HIGH (ref 70–99)

## 2018-07-31 LAB — POCT GLYCOSYLATED HEMOGLOBIN (HGB A1C): Hemoglobin A1C: 8.9 % — AB (ref 4.0–5.6)

## 2018-07-31 MED ORDER — PEN NEEDLES 32G X 4 MM MISC: 1.0000 | Freq: Every day | 5 refills | Status: DC

## 2018-07-31 NOTE — Progress Notes (Signed)
   Subjective:    [name], male    DOB: 1961/07/30, 57 y.o.   MRN: [id number]  HPI  Donald Palmer is here for DMF/U. Please see the A&P for the status of the pt's chronic medical problems.  ROS : per ROS section and in problem oriented charting. All other systems are negative.  PMHx, Soc hx, and / or Fam hx : Down to one tobacco cig per day. Sig other smokes a lot  Review of Systems  HENT: Positive for rhinorrhea.   Gastrointestinal:       No GERD sxs  Musculoskeletal: Positive for arthralgias and gait problem.       Objective:   Physical Exam  Constitutional: He appears well-developed and well-nourished. No distress.  HENT:  Head: Normocephalic and atraumatic.  Right Ear: External ear normal.  Left Ear: External ear normal.  Nose: Nose normal.  Eyes: Conjunctivae and EOM are normal. Right eye exhibits no discharge. Left eye exhibits no discharge. No scleral icterus.  Cardiovascular: Normal rate, regular rhythm and normal heart sounds.  Pulmonary/Chest: Effort normal and breath sounds normal.  Musculoskeletal: He exhibits no edema or deformity.  Still with L podagra but decreased pain and erythema and edema  Neurological: He is alert.  Skin: Skin is warm and dry. He is not diaphoretic.  Psychiatric: He has a normal mood and affect. His behavior is normal. Judgment and thought content normal.          Assessment & Plan:

## 2018-07-31 NOTE — Assessment & Plan Note (Signed)
This problem is improving on colchicine. We discussed that he might get another attack and if he does, to let me know. He does not need uric acid lowering as first attack and renal fxn nl.  PLAN : finish colchine and follow

## 2018-07-31 NOTE — Assessment & Plan Note (Signed)
The R lumbar / hip / groin / testicular pain resolved once he stopped oral DM meds and started liraglutide. He still has chronic pain but non opioid requiring. Has muscle relaxers.   PLAN : follow

## 2018-07-31 NOTE — Assessment & Plan Note (Signed)
This problem is chronic and uncontrolled. He had been on lisinopril but had been taking it PRN if he felet his BP was too high. His BP had been controlled at that time so I did not press the issue. BP now creeping up and I will need to rx an agent next appt.  PLAN : will need med next appt  BP Readings from Last 3 Encounters:  07/31/18 (!) 137/93  07/29/18 (!) 141/90  04/30/18 137/88

## 2018-07-31 NOTE — Assessment & Plan Note (Signed)
This problem is chronic and worsening. A1C trend is 7.9 - 8.6 - 8.9. I had added liraglutide and intended that he cont the actos but he stopped the actos. CBG was elevated today at 289 and A1C is also uncontrolled. He feels much better off the oral meds and only on the liraglutide (he calls it insulin). Many of his aches and pains resolved so he likes the injection. He is still on 0.6 mg as he did not increase to 1.2. He is drinking sodas and Gatorade.   PLAN : increase liraglutide to 1.2 QD A1C 3 months Stop sodas and Gatorade

## 2018-07-31 NOTE — Patient Instructions (Signed)
  1. Your sugar test, A1C, is high 2. Increase your sugar shot to 1.2 3. Stop sodas, Kool-aid, and Gatorade 4. I will refer you to cardiac rehab 5. Let me know if the gout comes back 6. See me in 3 months

## 2018-07-31 NOTE — Assessment & Plan Note (Signed)
He is on ASA, Pletal, and high intensity statin (liptor 80). He knows stopping tobacco is key. Not doing aerobic exercising. He states he can only walk a certain distance bc pain - not claudication pain. Has F/U with cards PA in October.  PLAN : stop tobacco  Cont meds Cardiac rehab

## 2018-07-31 NOTE — Assessment & Plan Note (Signed)
This problem is chronic and stable. He had nl stool cards this year. Ferritin was 37 in June 2018. Last HgB 10.8 but post op.   PLAN : follow

## 2018-08-01 ENCOUNTER — Other Ambulatory Visit: Payer: Self-pay | Admitting: Pharmacist

## 2018-08-01 NOTE — Patient Outreach (Signed)
[facility]) Care Management  08/01/2018  Donald Palmer 1961/06/10 [id number]   Outreach call to Donald Palmer regarding his request for follow up from the [facility] Medication Adherence Campaign. HIPAA identifiers verified and verbal consent received.  Mr. Stamour reports that he takes his atorvastatin 80 mg once daily as directed. Denies any missed doses. Counsel patient on importance of medication adherence.  Mr. Bagnall confirms that he did increase his Victoza dose to 1.2 mg daily as directed by his PCP at his Office Visit yesterday.  Mr. Erekson denies interest in having a [facility] RNCM contact him for diabetes education or other nursing needs. Denies any medication questions/concerns at this time.  Will close pharmacy episode at this time.  Harlow Asa, PharmD, [facility] [phone]

## 2018-08-19 ENCOUNTER — Telehealth (HOSPITAL_COMMUNITY): Payer: Self-pay

## 2018-08-19 NOTE — Telephone Encounter (Signed)
Patients insurance is active and benefits verified for SET Program - CPT code [phone]. Patient has a Midwife which [facility] follows Medicare guidelines in which Medicare covers CPT code [phone]. No co-pay, no deductible, no oop, no co-insurnace,and no pre-authorization is required. Reference #[id number]

## 2018-08-20 ENCOUNTER — Other Ambulatory Visit: Payer: Self-pay | Admitting: Internal Medicine

## 2018-08-20 DIAGNOSIS — I70212 Atherosclerosis of native arteries of extremities with intermittent claudication, left leg: Secondary | ICD-10-CM

## 2018-08-21 ENCOUNTER — Telehealth (HOSPITAL_COMMUNITY): Payer: Self-pay

## 2018-08-21 NOTE — Telephone Encounter (Signed)
Patients insurance is active and benefits verified through [facility] - Patient has a Careers information officer. [employer] follows Medicare guidelines and covers SET program at 100%.  Patient also has Medicaid but will not cover SET program.   Patient see's Dr.Berry on 09/25/18. Will place in f/u appt bin.

## 2018-09-04 ENCOUNTER — Telehealth: Payer: Self-pay | Admitting: Internal Medicine

## 2018-09-25 ENCOUNTER — Inpatient Hospital Stay (HOSPITAL_COMMUNITY): Admission: RE | Admit: 2018-09-25 | Payer: Medicare HMO | Source: Ambulatory Visit

## 2018-09-26 ENCOUNTER — Ambulatory Visit (HOSPITAL_COMMUNITY)
Admission: RE | Admit: 2018-09-26 | Payer: Medicare HMO | Source: Ambulatory Visit | Attending: Cardiovascular Disease | Admitting: Cardiovascular Disease

## 2018-09-29 NOTE — Addendum Note (Signed)
Addended by: Hulan Fray on: 09/29/2018 07:29 PM   Modules accepted: Orders

## 2018-09-30 ENCOUNTER — Encounter (HOSPITAL_COMMUNITY): Payer: Self-pay

## 2018-10-13 ENCOUNTER — Other Ambulatory Visit: Payer: Self-pay | Admitting: Internal Medicine

## 2018-10-13 DIAGNOSIS — E1140 Type 2 diabetes mellitus with diabetic neuropathy, unspecified: Secondary | ICD-10-CM

## 2018-10-16 ENCOUNTER — Other Ambulatory Visit: Payer: Self-pay

## 2018-10-16 NOTE — Telephone Encounter (Signed)
Called pt - no answer; left message to call the office. There are refills on pen needles.

## 2018-10-16 NOTE — Telephone Encounter (Signed)
Insulin Pen Needle (PEN NEEDLES) 32G X 4 MM MISC, Refill request @  [facility] [place], [place] - [place] [phone] (Phone) [phone] (Fax)

## 2018-10-22 ENCOUNTER — Ambulatory Visit: Payer: Medicare HMO

## 2018-10-23 ENCOUNTER — Ambulatory Visit: Payer: Medicare HMO

## 2018-10-24 ENCOUNTER — Ambulatory Visit (INDEPENDENT_AMBULATORY_CARE_PROVIDER_SITE_OTHER): Payer: Medicare HMO | Admitting: Internal Medicine

## 2018-10-24 ENCOUNTER — Other Ambulatory Visit: Payer: Self-pay

## 2018-10-24 VITALS — BP 157/92 | HR 84 | Temp 98.1°F | Ht 69.0 in | Wt 181.8 lb

## 2018-10-24 DIAGNOSIS — Z720 Tobacco use: Secondary | ICD-10-CM

## 2018-10-24 DIAGNOSIS — M546 Pain in thoracic spine: Secondary | ICD-10-CM

## 2018-10-24 DIAGNOSIS — Z23 Encounter for immunization: Secondary | ICD-10-CM | POA: Diagnosis not present

## 2018-10-24 DIAGNOSIS — Z791 Long term (current) use of non-steroidal anti-inflammatories (NSAID): Secondary | ICD-10-CM

## 2018-10-24 DIAGNOSIS — M5416 Radiculopathy, lumbar region: Secondary | ICD-10-CM | POA: Diagnosis not present

## 2018-10-24 DIAGNOSIS — Z79899 Other long term (current) drug therapy: Secondary | ICD-10-CM | POA: Diagnosis not present

## 2018-10-24 MED ORDER — MELOXICAM 15 MG PO TABS: 15.0000 mg | ORAL_TABLET | Freq: Every day | ORAL | 0 refills | Status: DC

## 2018-10-24 MED ORDER — METHOCARBAMOL 500 MG PO TABS: 500.0000 mg | ORAL_TABLET | Freq: Four times a day (QID) | ORAL | 0 refills | Status: DC

## 2018-10-24 MED ORDER — KETOROLAC TROMETHAMINE 30 MG/ML IJ SOLN
30.0000 mg | Freq: Once | INTRAMUSCULAR | Status: AC
  Administered 2018-10-24: 30 mg via INTRAMUSCULAR

## 2018-10-24 NOTE — Patient Instructions (Addendum)
Thank you for allowing [place] to provide your care today. Today we discussed your back pain. We think that this is related to the muscles in your back.     Today we made some changes to your medications.   You can start taking Mobic once a day for 7 day  You can start robaxin up to 4 times a day for 7 days.  We have sent these medications to your pharmacy.   Please follow-up in 2-3 weeks with your primary care provider to evaluate your back pain and your diabetes.    Should you have any questions or concerns please call the internal medicine clinic at [phone].

## 2018-10-24 NOTE — Assessment & Plan Note (Signed)
Obtained flu shot

## 2018-10-24 NOTE — Progress Notes (Signed)
CC: back pain  HPI: [name] is a 57 y.o.  with a PMH listed below presenting for back pain.  He reports that pain is located on his left upper back, going on for about a month, is a burning/sharp sensation, it's a constant pain that is worse with movement. Denies any radiation. He has tried a Theatre stage manager, he thinks Flexeril, and gabapentin states neither of these helps.  He denies any rashes or lesions over the area.  Denies any trauma, increase in exercise, or increase in activities.  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  He denies any weakness, numbness or tingling in his hands.  He denies any fevers, chills, diarrhea, constipation, nausea, vomiting, appetite changes, or any abdominal pain.  He does have a history of a lumbar radiculitis however this is in his left lower back and he states that it feels very different than this pain.  He denies any history of chickenpox or varicella vaccine.   Past Medical History:  Diagnosis Date  . Back pain, chronic   . Diabetes mellitus 2007  . Hepatitis C    [id number]  . Hypertension goal BP (blood pressure) < 140/80   . Microcytic anemia   . Neuromuscular disorder ([place])   . PAD (peripheral artery disease) ([place])   . Pancreatitis 01/09/2012   Review of Systems: Refer to history of present illness and assessment and plans for pertinent review of systems, all others reviewed and negative.  Physical Exam:  Vitals:   10/24/18 0928  BP: (!) 157/92  Pulse: 84  Temp: 98.1 F (36.7 C)  TempSrc: Oral  SpO2: 100%  Weight: 181 lb 12.8 oz (82.5 kg)  Height: 5\' 9"  (1.753 m)    Physical Exam  Constitutional: He is oriented to person, place, and time and well-developed, well-nourished, and in no distress.  HENT:  Head: Normocephalic and atraumatic.  Eyes: Pupils are equal, round, and reactive to light. Conjunctivae and EOM are normal.  Neck: Normal range of motion. Neck supple. No  thyromegaly present.  Cardiovascular: Normal rate, regular rhythm and normal heart sounds. Exam reveals no gallop and no friction rub.  No murmur heard. Pulmonary/Chest: Effort normal and breath sounds normal. No respiratory distress. He has no wheezes.  Abdominal: Soft. Bowel sounds are normal. He exhibits no distension.  Musculoskeletal: Normal range of motion. He exhibits tenderness (Tender to palpation over left upper back near rhomboid muscles). He exhibits no deformity.  Neurological: He is alert and oriented to person, place, and time. Gait normal.  Skin: Skin is warm and dry. No rash noted. No erythema.  No lesions, rashes, and erythema noted on back  Psychiatric: Mood and affect normal.    Social History   Socioeconomic History  . Marital status: Single    Spouse name: Not on file  . Number of children: Not on file  . Years of education: Not on file  . Highest education level: Not on file  Occupational History  . Not on file  Social Needs  . Financial resource strain: Not on file  . Food insecurity:    Worry: Not on file    Inability: Not on file  . Transportation needs:    Medical: Not on file    Non-medical: Not on file  Tobacco Use  . Smoking status: Current Some Day Smoker    Packs/day: 0.10    Years: 30.00    Pack  years: 3.00    Types: Cigarettes, Cigars  . Smokeless tobacco: Never Used  . Tobacco comment: 5 cigs per week  Substance and Sexual Activity  . Alcohol use: Yes    Alcohol/week: 5.0 standard drinks    Types: 5 Cans of beer per week    Comment: Beer.  . Drug use: Yes    Frequency: 1.0 times per week    Types: Marijuana    Comment: rarely  . Sexual activity: Yes  Lifestyle  . Physical activity:    Days per week: Not on file    Minutes per session: Not on file  . Stress: Not on file  Relationships  . Social connections:    Talks on phone: Not on file    Gets together: Not on file    Attends religious service: Not on file    Active member  of club or organization: Not on file    Attends meetings of clubs or organizations: Not on file    Relationship status: Not on file  . Intimate partner violence:    Fear of current or ex partner: Not on file    Emotionally abused: Not on file    Physically abused: Not on file    Forced sexual activity: Not on file  Other Topics Concern  . Not on file  Social History Narrative   Prior incarceration. Tattoos, inc ones obtained in prison.    On disability since 09/2013    Family History  Problem Relation Age of Onset  . Diabetes Mother   . Hypertension Mother   . Hyperlipidemia Mother   . Diabetes Brother   . Hypertension Brother   . Heart attack Neg Hx   . Sudden death Neg Hx     Assessment & Plan:   See Encounters Tab for problem based charting.  Patient seen with Dr. Angelia Mould

## 2018-10-24 NOTE — Assessment & Plan Note (Signed)
He reports that pain is located on his left upper back, going on for about a month, is a burning/sharp sensation, it's a constant pain that is worse with movement. Denies any radiation. He has tried a Theatre stage manager, he thinks Flexeril, and gabapentin states neither of these helps.  He denies any rashes or lesions over the area.  Denies any trauma, increase in exercise, or increase in activities.  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  He denies any weakness, numbness or tingling in his hands.  He denies any fevers, chills, diarrhea, constipation, nausea, vomiting, appetite changes, or any abdominal pain.  He does have a history of a lumbar radiculitis however this is in his left lower back and he states that it feels very different than this pain.  He denies any history of chickenpox or varicella vaccine.  On exam he has significant tenderness to palpation on the left upper back near the rhomboid muscles.  Denies any pain in the spinal areas.  No rashes or lesions over the area.  His strength and sensation in his upper extremities are normal.  Assessment: Appears to be more musculoskeletal in nature, he has no neurological deficits, no rashes or lesions, there is no radiation of the pain.  On exam he is significantly tender to palpation over the rhomboid muscles.  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  This may have caused inflammation his muscles.  He is requesting some sort of shot for the pain, we gave him a Toradol shot in the clinic and he reports significant improvement with this.  Plan: -Prescribed Mobic 15 mg daily for 7 days -Prescribed Robaxin every 6 hours for 7 days -Asked patient to follow-up in 2 to 3 weeks to reevaluate pain and diabetes

## 2018-10-27 ENCOUNTER — Other Ambulatory Visit: Payer: Self-pay | Admitting: Internal Medicine

## 2018-10-27 NOTE — Telephone Encounter (Signed)
Rx Robaxin on the 8th by Dr Sherry Ruffing. Should not combine the two muscle relaxers

## 2018-10-27 NOTE — Progress Notes (Signed)
Internal Medicine Clinic Attending  I saw and evaluated the patient.  I personally confirmed the key portions of the history and exam documented by Dr. Krienke and I reviewed pertinent patient test results.  The assessment, diagnosis, and plan were formulated together and I agree with the documentation in the resident's note.    

## 2018-10-29 ENCOUNTER — Emergency Department (HOSPITAL_COMMUNITY)
Admission: EM | Admit: 2018-10-29 | Discharge: 2018-10-29 | Disposition: A | Discharge status: Home / Self Care | Payer: Medicare HMO | Attending: Emergency Medicine | Admitting: Emergency Medicine

## 2018-10-29 ENCOUNTER — Other Ambulatory Visit: Payer: Self-pay

## 2018-10-29 ENCOUNTER — Emergency Department (HOSPITAL_COMMUNITY): Payer: Medicare HMO

## 2018-10-29 ENCOUNTER — Encounter (HOSPITAL_COMMUNITY): Payer: Self-pay | Admitting: Emergency Medicine

## 2018-10-29 DIAGNOSIS — Z794 Long term (current) use of insulin: Secondary | ICD-10-CM | POA: Diagnosis not present

## 2018-10-29 DIAGNOSIS — Z7982 Long term (current) use of aspirin: Secondary | ICD-10-CM | POA: Insufficient documentation

## 2018-10-29 DIAGNOSIS — I10 Essential (primary) hypertension: Secondary | ICD-10-CM | POA: Insufficient documentation

## 2018-10-29 DIAGNOSIS — N5082 Scrotal pain: Secondary | ICD-10-CM | POA: Insufficient documentation

## 2018-10-29 DIAGNOSIS — E119 Type 2 diabetes mellitus without complications: Secondary | ICD-10-CM | POA: Diagnosis not present

## 2018-10-29 DIAGNOSIS — R1030 Lower abdominal pain, unspecified: Secondary | ICD-10-CM | POA: Insufficient documentation

## 2018-10-29 DIAGNOSIS — F17210 Nicotine dependence, cigarettes, uncomplicated: Secondary | ICD-10-CM | POA: Diagnosis not present

## 2018-10-29 DIAGNOSIS — Z7902 Long term (current) use of antithrombotics/antiplatelets: Secondary | ICD-10-CM | POA: Insufficient documentation

## 2018-10-29 DIAGNOSIS — R109 Unspecified abdominal pain: Secondary | ICD-10-CM | POA: Diagnosis not present

## 2018-10-29 DIAGNOSIS — M546 Pain in thoracic spine: Secondary | ICD-10-CM

## 2018-10-29 LAB — BASIC METABOLIC PANEL
Anion gap: 5 (ref 5–15)
BUN: 10 mg/dL (ref 6–20)
CALCIUM: 9.6 mg/dL (ref 8.9–10.3)
CO2: 27 mmol/L (ref 22–32)
CREATININE: 1.14 mg/dL (ref 0.61–1.24)
Chloride: 105 mmol/L (ref 98–111)
GFR calc Af Amer: >60 mL/min (ref >60)
GFR calc non Af Amer: >60 mL/min (ref >60)
GLUCOSE: 134 mg/dL — AB (ref 70–99)
Potassium: 4 mmol/L (ref 3.5–5.1)
Sodium: 137 mmol/L (ref 135–145)

## 2018-10-29 LAB — CBC WITH DIFFERENTIAL/PLATELET
Abs Immature Granulocytes: 0.02 10*3/uL (ref 0.00–0.07)
Basophils Absolute: 0 10*3/uL (ref 0.0–0.1)
Basophils Relative: 0 %
EOS ABS: 0.1 10*3/uL (ref 0.0–0.5)
EOS PCT: 2 %
HEMATOCRIT: 46.2 % (ref 39.0–52.0)
Hemoglobin: 13.4 g/dL (ref 13.0–17.0)
Immature Granulocytes: 0 %
LYMPHS ABS: 2.9 10*3/uL (ref 0.7–4.0)
Lymphocytes Relative: 43 %
MCH: 21.1 pg — AB (ref 26.0–34.0)
MCHC: 29 g/dL — AB (ref 30.0–36.0)
MCV: 72.6 fL — AB (ref 80.0–100.0)
MONO ABS: 0.7 10*3/uL (ref 0.1–1.0)
MONOS PCT: 10 %
Neutro Abs: 3.1 10*3/uL (ref 1.7–7.7)
Neutrophils Relative %: 45 %
Platelets: 300 10*3/uL (ref 150–400)
RBC: 6.36 MIL/uL — ABNORMAL HIGH (ref 4.22–5.81)
RDW: 15.8 % — AB (ref 11.5–15.5)
WBC: 6.8 10*3/uL (ref 4.0–10.5)
nRBC: 0 % (ref 0.0–0.2)

## 2018-10-29 LAB — URINALYSIS, ROUTINE W REFLEX MICROSCOPIC
BILIRUBIN URINE: NEGATIVE
GLUCOSE, UA: NEGATIVE mg/dL
Hgb urine dipstick: NEGATIVE
KETONES UR: NEGATIVE mg/dL
Leukocytes, UA: NEGATIVE
Nitrite: NEGATIVE
PH: 6 (ref 5.0–8.0)
Protein, ur: NEGATIVE mg/dL
SPECIFIC GRAVITY, URINE: 1.015 (ref 1.005–1.030)

## 2018-10-29 MED ORDER — KETOROLAC TROMETHAMINE 30 MG/ML IJ SOLN
30.0000 mg | Freq: Once | INTRAMUSCULAR | Status: AC
  Administered 2018-10-29: 30 mg via INTRAVENOUS
  Filled 2018-10-29: qty 1

## 2018-10-29 MED ORDER — HYDROCODONE-ACETAMINOPHEN 5-325 MG PO TABS: 2.0000 | ORAL_TABLET | Freq: Four times a day (QID) | ORAL | 0 refills | Status: DC | PRN

## 2018-10-29 NOTE — ED Triage Notes (Signed)
Pt reports left sided flank pain that started 1 month ago and has been progressively getting worse. Pt reports a history of right sided chronic back pain. Pt denies any urinary symptoms.

## 2018-10-29 NOTE — ED Provider Notes (Signed)
TIME SEEN: 2:06 AM  CHIEF COMPLAINT: Left-sided back pain  HPI: Patient is a 57 year old male with history of tonic right-sided back pain, hypertension, diabetes, hepatitis C who presents to the emergency department with 1 month of left-sided aching back pain.  States pain is progressively worsened and is now radiating into his lower abdomen and into his scrotum.  He states he is concerned he could have a kidney stone.  He has never had a kidney stone before.  States he was seen as an outpatient last week and given Robaxin and Mobic which have not provided him any relief.  Denies numbness, tingling or focal weakness.  No bowel or bladder incontinence.  No fever, nausea, vomiting or diarrhea.  No injury to his back.  Pain is worse with movement and palpation.  ROS: See HPI Constitutional: no fever  Eyes: no drainage  ENT: no runny nose   Cardiovascular:  no chest pain  Resp: no SOB  GI: no vomiting GU: no dysuria Integumentary: no rash  Allergy: no hives  Musculoskeletal: no leg swelling  Neurological: no slurred speech ROS otherwise negative  PAST MEDICAL HISTORY/PAST SURGICAL HISTORY:  Past Medical History:  Diagnosis Date  . Back pain, chronic   . Diabetes mellitus 2007  . Hepatitis C    [id number]  . Hypertension goal BP (blood pressure) < 140/80   . Microcytic anemia   . Neuromuscular disorder ([place])   . PAD (peripheral artery disease) ([place])   . Pancreatitis 01/09/2012    MEDICATIONS:  Prior to Admission medications   Medication Sig Start Date End Date Taking? Authorizing Provider  ACCU-CHEK AVIVA PLUS test strip check blood sugar one time A DAY as instructed (max check TWO) 10/13/18   [name], MD  aspirin EC 81 MG tablet Take 1 tablet (81 mg total) by mouth daily. 12/24/17 12/24/18  Kalman Shan Ratliff, DO  atorvastatin (LIPITOR) 80 MG tablet TAKE ONE TABLET BY MOUTH DAILY AT SIX p.m. 05/05/18   Lelon Perla, MD  baclofen (LIORESAL) 10 MG tablet Take 1  tablet (10 mg total) by mouth every 8 (eight) hours as needed for muscle spasms. Patient not taking: Reported on 07/31/2018 06/07/16   [name], MD  Blood Glucose Monitoring Suppl (ACCU-CHEK AVIVA PLUS) W/DEVICE KIT 1 each by Does not apply route 2 (two) times daily. To check blood sugar twice daily. diag code E11.40. Non insulin dependent. Please check with pt that this is the equipment he needs. 09/08/15   [name], MD  cholecalciferol (VITAMIN D) 1000 units tablet Take 1 tablet (1,000 Units total) by mouth daily. Patient not taking: Reported on 07/31/2018 12/24/17   Kalman Shan Ratliff, DO  clopidogrel (PLAVIX) 75 MG tablet TAKE ONE TABLET BY MOUTH DAILY with breakfast 05/01/18   Cheryln Manly, NP  clotrimazole-betamethasone (LOTRISONE) cream Apply 1 application topically 2 (two) times daily. 04/20/15   [name], DPM  colchicine 0.6 MG tablet Take 1 tablet (0.6 mg total) by mouth daily for 7 doses. 07/29/18 08/05/18  Jean Rosenthal, MD  cyclobenzaprine (FLEXERIL) 10 MG tablet TAKE ONE TABLET BY MOUTH AT BEDTIME AS NEEDED FOR MUSCLE SPASMS Patient not taking: Reported on 07/31/2018 06/25/18   [name], MD  fluticasone [facility]) 50 MCG/ACT nasal spray Place 2 sprays into both nostrils daily. 120/4=30 Patient taking differently: Place 2 sprays into both nostrils daily 01/07/18   [name], MD  Insulin Pen Needle (PEN NEEDLES) 32G X 4 MM MISC  1 each by Does not apply route daily. Use to injected liraglutide once a day 07/31/18   [name], MD  Lancet Devices MISC 1 each by Does not apply route 2 (two) times daily. Please use to check blood sugar 2 times daily. diag code E11.40. noninsulin dependent 09/01/15   [name], MD  liraglutide (VICTOZA) 18 MG/3ML SOPN Inject 0.1 mLs (0.6 mg total) into the skin daily. After 1 week, increase to 1.2 mg daily 04/10/18   [name], MD  lisinopril (PRINIVIL,ZESTRIL) 20 MG tablet Take  1 tablet (20 mg total) by mouth daily. 01/07/18   [name], MD  loratadine (CLARITIN) 10 MG tablet Take 1 tablet (10 mg total) by mouth daily. Patient not taking: Reported on 07/31/2018 05/30/17   [name], MD  meloxicam (MOBIC) 15 MG tablet Take 1 tablet (15 mg total) by mouth daily. 10/24/18   Asencion Noble, MD  methocarbamol (ROBAXIN) 500 MG tablet Take 1 tablet (500 mg total) by mouth 4 (four) times daily. 10/24/18   Asencion Noble, MD  nicotine polacrilex (COMMIT) 2 MG lozenge Take 1 lozenge (2 mg total) by mouth every 2 (two) hours. May also take 1 lozenge (2 mg total) every hour as needed for smoking cessation. Patient taking differently: No sig reported 02/05/18   Forde Dandy, PharmD  pantoprazole (PROTONIX) 40 MG tablet TAKE ONE TABLET BY MOUTH DAILY Patient not taking: Reported on 07/31/2018 05/05/18   Lelon Perla, MD  pregabalin (LYRICA) 50 MG capsule Take 1 capsule (50 mg total) by mouth 3 (three) times daily. 04/10/18 04/10/19  [name], MD    ALLERGIES:  Allergies  Allergen Reactions  . Metformin And Related Other (See Comments)    GI side effects. Stopped 2016    SOCIAL HISTORY:  Social History   Tobacco Use  . Smoking status: Current Some Day Smoker    Packs/day: 0.10    Years: 30.00    Pack years: 3.00    Types: Cigarettes, Cigars  . Smokeless tobacco: Never Used  . Tobacco comment: 5 cigs per week  Substance Use Topics  . Alcohol use: Yes    Alcohol/week: 5.0 standard drinks    Types: 5 Cans of beer per week    Comment: Beer.    FAMILY HISTORY: Family History  Problem Relation Age of Onset  . Diabetes Mother   . Hypertension Mother   . Hyperlipidemia Mother   . Diabetes Brother   . Hypertension Brother   . Heart attack Neg Hx   . Sudden death Neg Hx     EXAM: BP (!) 170/99 (BP Location: Right Arm)   Pulse 70   Temp 97.7 F (36.5 C) (Oral)   Resp 18   SpO2 100%  CONSTITUTIONAL: Alert and oriented and  responds appropriately to questions. Well-appearing; well-nourished HEAD: Normocephalic EYES: Conjunctivae clear, pupils appear equal, EOMI ENT: normal nose; moist mucous membranes NECK: Supple, no meningismus, no nuchal rigidity, no LAD  CARD: RRR; S1 and S2 appreciated; no murmurs, no clicks, no rubs, no gallops RESP: Normal chest excursion without splinting or tachypnea; breath sounds clear and equal bilaterally; no wheezes, no rhonchi, no rales, no hypoxia or respiratory distress, speaking full sentences ABD/GI: Normal bowel sounds; non-distended; soft, non-tender, no rebound, no guarding, no peritoneal signs, no hepatosplenomegaly GU:  Normal external genitalia, circumcised male, normal penile shaft, no blood or discharge at the urethral meatus, no testicular masses or tenderness on exam,  no scrotal masses or swelling, no hernias appreciated, 2+ femoral pulses bilaterally; no perineal erythema, warmth, subcutaneous air or crepitus; no high riding testicle, normal bilateral cremasteric reflex.  Chaperone present for exam. BACK:  The back appears normal and is tender over the thoracic paraspinal muscles on the left but no midline spinal tenderness or step-off or deformity EXT: Normal ROM in all joints; non-tender to palpation; no edema; normal capillary refill; no cyanosis, no calf tenderness or swelling    SKIN: Normal color for age and race; warm; no rash NEURO: Moves all extremities equally strength 5/5 in all 4 extremities, sensation to light touch intact diffusely, normal speech, normal gait, no saddle anesthesia PSYCH: The patient's mood and manner are appropriate. Grooming and personal hygiene are appropriate.  MEDICAL DECISION MAKING: Patient here with left-sided flank pain.  This seems to be musculoskeletal in nature but he is concerned he could have a kidney stone given pain is now radiated into his scrotum.  His genital exam is normal.  Doubt testicular torsion, epididymitis.  I do not  feel it is unreasonable given pain radiating into the inguinal area to obtain CT study to rule out stone.  We will also obtain labs, urine.  Patient drove himself to the emergency department.  Will give Toradol for pain.  ED PROGRESS: Patient reports no significant improvement in pain after Toradol.  He does not have anyone to drive him home.  States muscle relaxers have not been helping him.  We discussed that we cannot provide him with any sedative medications if he plans to drive.  Labs are unremarkable.  CT scan shows no acute abnormality specifically no kidney stone or sign of pyelonephritis.  Urine shows no blood and no sign of infection.  I suspect that this is musculoskeletal pain.  Will discharge with prescription of Vicodin for further pain control.   At this time, I do not feel there is any life-threatening condition present. I have reviewed and discussed all results (EKG, imaging, lab, urine as appropriate) and exam findings with patient/family. I have reviewed nursing notes and appropriate previous records.  I feel the patient is safe to be discharged home without further emergent workup and can continue workup as an outpatient as needed. Discussed usual and customary return precautions. Patient/family verbalize understanding and are comfortable with this plan.  Outpatient follow-up has been provided if needed. All questions have been answered.     Ward, Delice Bison, DO 10/29/18 [phone]

## 2018-10-29 NOTE — Discharge Instructions (Signed)
Your labs, urine and CT scan today were normal.  No sign of kidney infection, kidney stone, UTI.  This is likely musculoskeletal back pain.  Please continue your meloxicam and methocarbamol as prescribed.

## 2018-10-29 NOTE — ED Notes (Addendum)
Asked pt for urine 2x

## 2018-10-29 NOTE — ED Notes (Signed)
Patient verbalizes understanding of discharge instructions. Opportunity for questioning and answers were provided. Armband removed by staff, pt discharged from ED home via POV.  

## 2018-11-07 ENCOUNTER — Other Ambulatory Visit: Payer: Self-pay | Admitting: Cardiology

## 2018-11-10 ENCOUNTER — Other Ambulatory Visit: Payer: Self-pay

## 2018-11-10 NOTE — Telephone Encounter (Signed)
HYDROcodone-acetaminophen (NORCO/VICODIN) 5-325 MG tablet   Refill request @  [place], [place] - [place] [phone] (Phone) [phone] (Fax)

## 2018-11-10 NOTE — Telephone Encounter (Signed)
Last rx written 10/29/18. Last OV 10/24/18 in [facility]. Next OV 11/27/18. UDS 01/09/12.

## 2018-11-10 NOTE — Telephone Encounter (Signed)
He is not on chronic opioids and I do not feel (so far) that he requires opioids. The ED prescribed it.

## 2018-11-27 ENCOUNTER — Ambulatory Visit (INDEPENDENT_AMBULATORY_CARE_PROVIDER_SITE_OTHER): Payer: Medicare HMO | Admitting: Internal Medicine

## 2018-11-27 ENCOUNTER — Encounter: Payer: Self-pay | Admitting: Internal Medicine

## 2018-11-27 VITALS — BP 138/82 | HR 95 | Temp 98.5°F | Resp 20 | Wt 177.8 lb

## 2018-11-27 DIAGNOSIS — Z8679 Personal history of other diseases of the circulatory system: Secondary | ICD-10-CM

## 2018-11-27 DIAGNOSIS — G580 Intercostal neuropathy: Secondary | ICD-10-CM

## 2018-11-27 DIAGNOSIS — E1142 Type 2 diabetes mellitus with diabetic polyneuropathy: Secondary | ICD-10-CM | POA: Diagnosis not present

## 2018-11-27 DIAGNOSIS — Z720 Tobacco use: Secondary | ICD-10-CM

## 2018-11-27 DIAGNOSIS — E1140 Type 2 diabetes mellitus with diabetic neuropathy, unspecified: Secondary | ICD-10-CM

## 2018-11-27 DIAGNOSIS — J0101 Acute recurrent maxillary sinusitis: Secondary | ICD-10-CM

## 2018-11-27 DIAGNOSIS — G894 Chronic pain syndrome: Secondary | ICD-10-CM | POA: Diagnosis not present

## 2018-11-27 DIAGNOSIS — E1151 Type 2 diabetes mellitus with diabetic peripheral angiopathy without gangrene: Secondary | ICD-10-CM | POA: Diagnosis not present

## 2018-11-27 DIAGNOSIS — Z794 Long term (current) use of insulin: Secondary | ICD-10-CM

## 2018-11-27 DIAGNOSIS — Z7982 Long term (current) use of aspirin: Secondary | ICD-10-CM | POA: Diagnosis not present

## 2018-11-27 DIAGNOSIS — M109 Gout, unspecified: Secondary | ICD-10-CM | POA: Diagnosis not present

## 2018-11-27 DIAGNOSIS — Z79899 Other long term (current) drug therapy: Secondary | ICD-10-CM

## 2018-11-27 DIAGNOSIS — I739 Peripheral vascular disease, unspecified: Secondary | ICD-10-CM

## 2018-11-27 DIAGNOSIS — I152 Hypertension secondary to endocrine disorders: Secondary | ICD-10-CM

## 2018-11-27 LAB — GLUCOSE, CAPILLARY: GLUCOSE-CAPILLARY: 146 mg/dL — AB (ref 70–99)

## 2018-11-27 LAB — POCT GLYCOSYLATED HEMOGLOBIN (HGB A1C): Hemoglobin A1C: 6.9 % — AB (ref 4.0–5.6)

## 2018-11-27 MED ORDER — LIRAGLUTIDE 18 MG/3ML ~~LOC~~ SOPN: 1.2000 mg | PEN_INJECTOR | Freq: Every day | SUBCUTANEOUS | 3 refills | Status: DC

## 2018-11-27 MED ORDER — ATORVASTATIN CALCIUM 80 MG PO TABS: ORAL_TABLET | ORAL | 3 refills | Status: DC

## 2018-11-27 MED ORDER — ASPIRIN EC 81 MG PO TBEC: 81.0000 mg | DELAYED_RELEASE_TABLET | Freq: Every day | ORAL | 3 refills | Status: AC

## 2018-11-27 MED ORDER — FLUTICASONE PROPIONATE 50 MCG/ACT NA SUSP: NASAL | 11 refills | Status: DC

## 2018-11-27 MED ORDER — PANTOPRAZOLE SODIUM 40 MG PO TBEC: 40.0000 mg | DELAYED_RELEASE_TABLET | Freq: Every day | ORAL | 3 refills | Status: DC

## 2018-11-27 MED ORDER — PREGABALIN 50 MG PO CAPS: 50.0000 mg | ORAL_CAPSULE | Freq: Two times a day (BID) | ORAL | 1 refills | Status: DC

## 2018-11-27 MED ORDER — CYCLOBENZAPRINE HCL 10 MG PO TABS: ORAL_TABLET | ORAL | 2 refills | Status: DC

## 2018-11-27 MED ORDER — KETOROLAC TROMETHAMINE 30 MG/ML IJ SOLN
30.0000 mg | Freq: Once | INTRAMUSCULAR | Status: AC
  Administered 2018-11-27: 30 mg via INTRAMUSCULAR

## 2018-11-27 MED ORDER — LISINOPRIL 20 MG PO TABS: 20.0000 mg | ORAL_TABLET | Freq: Every day | ORAL | 3 refills | Status: DC

## 2018-11-27 NOTE — Assessment & Plan Note (Signed)
R posterior thoracic pain : This is a chronic and stable issue.  He has been seen in [facility] previously for this and he states he went to the ED but their note talks about flank and lumbar pain.  He has had this pain since October.  Initially, it was attributed to musculoskeletal pain.  The ED worked up a kidney stone.  It is in an area of about 6 or 7 cm over the left scapula area.  He states it is in the skin and is a burning sensation or like somebody is sticking a pin into the skin.  He also describes a soreness in the area and can be tender to palpation.  It is 24 hours a day and nothing makes it worse or better.  Movement either of the shoulder or the thoracic spine does not make it worse.  He demonstrated by moving his shoulder all around and twisting and bending without increased discomfort.  He has been tried on gabapentin, Flexeril, Mobic, Robaxin and states none of those have helped.  He states the shot the ER gave him which was Toradol did help for a day or 2.  On examination, the skin is normal in appearance without any induration, erythema, or thickening.  He has a free range of motion.  There is slight tenderness to palpation over the indicated area.  ASSESSMENT : This does not fit a classic disease prescription for me.  This is not shingles as it is nonradicular and the pain has been on 3 months.  I do not think that this is musculoskeletal as it does not worsen with movement.  There is a little bit of tenderness with palpation of the muscle however.  I do not think that this is pulmonary since describes a very superficial discomfort and he has had no other pulmonary symptoms.  I considered.Scleredema but do not think that this is likely as she has no skin changes typical for this condition.  However, 3 months may be too early into this condition for the skin changes to be developed?  The best diagnosis I can come up with would be a neuropathic pain.  It would have to involve more than 1 dermatome  nerve root since it is a fairly wide area but I am going to start with an MRI of the thoracic spine to see if there is any nerve compression.  I am also recommending that he resume his Lyrica as that might help to provide some relief while we work this up.  PLAN : MRI thoracic spine Lyrica 50 twice daily

## 2018-11-27 NOTE — Patient Instructions (Signed)
1. See me in January 2. I gave you paper prescriptions for your medicines 3. I am getting an MRI of your back

## 2018-11-27 NOTE — Assessment & Plan Note (Signed)
This problem is chronic and stable.  He was supposed to be on Lyrica 50 3 times daily.  He is not taking this medicine.  I am starting Lyrica 50 twice daily as I think a twice daily dosing will be better compliance than 3 times daily.  I can escalate as needed.  PLAN : Lyrica 50 twice daily

## 2018-11-27 NOTE — Progress Notes (Signed)
   Subjective:    [name], male    DOB: [birth year], 57 y.o.   MRN: [id number]  HPI  Donald Palmer is here for DM F/U. Please see the A&P for the status of the pt's chronic medical problems.  ROS : per ROS section and in problem oriented charting. All other systems are negative.  PMHx, Soc hx, and / or Fam hx :  Working as a Training and development officer  Review of Systems  Gastrointestinal: Negative for abdominal pain, constipation, diarrhea and vomiting.       Occ abd cramping  Musculoskeletal: Positive for back pain and gait problem.  Skin: Negative for color change, rash and wound.       Objective:   Physical Exam Constitutional:      General: He is not in acute distress.    Appearance: Normal appearance. He is not ill-appearing, toxic-appearing or diaphoretic.  HENT:     Head: Normocephalic and atraumatic.     Right Ear: External ear normal.     Left Ear: External ear normal.     Nose: Nose normal. No congestion or rhinorrhea.  Eyes:     Extraocular Movements: Extraocular movements intact.     Conjunctiva/sclera: Conjunctivae normal.  Musculoskeletal: Normal range of motion.        General: No swelling or deformity.  Skin:    General: Skin is warm and dry.     Findings: No bruising, erythema, lesion or rash.  Neurological:     Mental Status: He is alert.  Psychiatric:        Mood and Affect: Mood normal.        Behavior: Behavior normal.        Thought Content: Thought content normal.        Judgment: Judgment normal.           Assessment & Plan:

## 2018-11-27 NOTE — Assessment & Plan Note (Signed)
He has had no further episodes of gout pain.

## 2018-11-27 NOTE — Assessment & Plan Note (Addendum)
This problem is chronic and stable.  His A1c trend has been 8.6 - 8.9 - 6.9.  He is prescribed Liraglutide 1.2 QD and had been taking it daily until about a week ago when he lost it at his daughters..  I had intended for him to stay on Actos but he got confused and stopped it.  I recommended that he resume at his last appointment but he has not restarted it.  His A1c is at goal today and he denies any hypoglycemia.  He did not bring in his meter today.  I think it is okay to continue the liraglutide as monotherapy.  PLAN:  Cont current meds

## 2018-11-27 NOTE — Assessment & Plan Note (Signed)
This problem is chronic and stable.  He stated he is still taking his aspirin but is not taking any Plavix.  He did not go to his outpatient cardiology follow-up appointment in October.  He is smoking about 4 cigarettes a week.  He understands he needs to quit smoking.  I encouraged him to resume his Plavix.  He remains on his Lipitor 80 mg high intensity statin.  We are working on risk factor management to prevent further progression of his PAD.  PLAN:  Cont current meds

## 2018-11-27 NOTE — Assessment & Plan Note (Signed)
This problem is chronic and stable.  At his last appointment, he stated he was taking his lisinopril as needed.  Today, he states he is taking it every other day.  His GFR is greater than 60.  I am checking a microalbumin level today although he was unable to give a urine sample so I will check it in January.  There are other superseding issues today so I did not stress the importance of taking lisinopril daily.  My blood pressure goal in him would be less than 130/80 so he is very close to goal.   PLAN:  Cont current meds   BP Readings from Last 3 Encounters:  11/27/18 138/82  10/29/18 (!) 152/92  10/24/18 (!) 157/92

## 2019-01-02 ENCOUNTER — Encounter: Payer: Self-pay | Admitting: Internal Medicine

## 2019-01-02 ENCOUNTER — Ambulatory Visit: Payer: Medicare HMO

## 2019-01-05 DIAGNOSIS — R079 Chest pain, unspecified: Secondary | ICD-10-CM | POA: Diagnosis not present

## 2019-01-05 DIAGNOSIS — M546 Pain in thoracic spine: Secondary | ICD-10-CM | POA: Diagnosis not present

## 2019-01-14 ENCOUNTER — Encounter: Payer: Self-pay | Admitting: *Deleted

## 2019-01-15 ENCOUNTER — Ambulatory Visit: Payer: Medicare HMO | Admitting: Internal Medicine

## 2019-01-20 ENCOUNTER — Other Ambulatory Visit: Payer: Self-pay | Admitting: Internal Medicine

## 2019-01-20 DIAGNOSIS — E1140 Type 2 diabetes mellitus with diabetic neuropathy, unspecified: Secondary | ICD-10-CM

## 2019-01-21 NOTE — Telephone Encounter (Signed)
Next appt scheduled 2/20 with PCP. Last refill was "Print".

## 2019-01-29 ENCOUNTER — Ambulatory Visit: Payer: Medicare HMO | Admitting: Internal Medicine

## 2019-01-30 ENCOUNTER — Ambulatory Visit (HOSPITAL_COMMUNITY)
Admission: RE | Admit: 2019-01-30 | Discharge: 2019-01-30 | Disposition: A | Discharge status: Home / Self Care | Payer: Medicare HMO | Source: Ambulatory Visit | Attending: Internal Medicine | Admitting: Internal Medicine

## 2019-01-30 ENCOUNTER — Other Ambulatory Visit (HOSPITAL_COMMUNITY): Payer: Self-pay | Admitting: Internal Medicine

## 2019-01-30 ENCOUNTER — Other Ambulatory Visit: Payer: Self-pay

## 2019-01-30 ENCOUNTER — Ambulatory Visit (INDEPENDENT_AMBULATORY_CARE_PROVIDER_SITE_OTHER): Payer: Medicare HMO | Admitting: Internal Medicine

## 2019-01-30 DIAGNOSIS — G580 Intercostal neuropathy: Secondary | ICD-10-CM | POA: Insufficient documentation

## 2019-01-30 DIAGNOSIS — D1809 Hemangioma of other sites: Secondary | ICD-10-CM | POA: Diagnosis not present

## 2019-01-30 DIAGNOSIS — E1141 Type 2 diabetes mellitus with diabetic mononeuropathy: Secondary | ICD-10-CM | POA: Diagnosis not present

## 2019-01-30 DIAGNOSIS — M549 Dorsalgia, unspecified: Secondary | ICD-10-CM

## 2019-01-30 MED ORDER — PREGABALIN 100 MG PO CAPS: 100.0000 mg | ORAL_CAPSULE | Freq: Two times a day (BID) | ORAL | 1 refills | Status: DC

## 2019-01-30 MED ORDER — CARBAMAZEPINE ER 400 MG PO TB12: 400.0000 mg | ORAL_TABLET | Freq: Every day | ORAL | 1 refills | Status: DC

## 2019-01-30 NOTE — Patient Instructions (Addendum)
Donald Palmer,  I am sorry to hear about your back pain. I have increased your lyrica to 100 mg twice a day and started a new medication call carbamazepine (aka tegretol). Please take the new medicine once a day.   Please keep you scheduled appointment with Dr. Lynnae January this month. If you have any questions or concerns, call our clinic at [phone] or after hours call [phone] and ask for the internal medicine resident on call. Thank you!  Dr. Philipp Ovens

## 2019-01-30 NOTE — Assessment & Plan Note (Addendum)
Patient is here for follow up of back pain. He has pain isolated to his left T4 dermatome. It starts under his scapula and radiates around to the front involving the nipple. He does not cross midline. There is no rash or skin changes. He reports sharp stabbing pain with allodynia. On exam it is very painful to even light touch. The pain is very distressing for him and disruptive to his sleep. He is upset and demanding pain medications. Reports none of the medicines previously prescribed are helping. He had an MRI today to rule out impinged nerve. The radiology read is pending. Discussed with patient that if MRI is negative, pain is likely from a diabetic mononeuropathy. He has a history of diabetes with peripheral neuropathy in his feet. Explained that opioid pain medications are inappropriate to treat this type of pain but we will continue to work with him to find the right regimen to help him. Will increase lyrica today and add carbamazepine. -- F/u MRI results -- Increase lyrica 100 mg BID -- Start carbamazepine ER 400 mg daily  -- F/u PCP

## 2019-01-30 NOTE — Progress Notes (Signed)
   CC: Back pain  HPI:  Mr.Aquarius D Ericsson is a 58 y.o. male with past medical history outlined below here for back pain. For the details of today's visit, please refer to the assessment and plan.  Past Medical History:  Diagnosis Date  . Back pain, chronic   . Diabetes mellitus 2007  . Hepatitis C    [id number]  . Hypertension goal BP (blood pressure) < 140/80   . Microcytic anemia   . Neuromuscular disorder ([place])   . PAD (peripheral artery disease) ([place])   . Pancreatitis 01/09/2012    ROS  Physical Exam:  Vitals:   01/30/19 1603  BP: (!) 165/77  Pulse: (!) 104  Temp: (!) 97 F (36.1 C)  TempSrc: Oral  SpO2: 99%  Weight: 184 lb 9.6 oz (83.7 kg)  Height: 5\' 9"  (1.753 m)    Constitutional: NAD, appears comfortable Cardiovascular: RRR, no m/r/g Pulmonary/Chest: CTAB, no wheezes, rales, or rhonchi.  MSK: Pain to light palpation over left T4 dermatome. No overlying skin changes or gross abnormalities. Thoracic spine is non tender to palpation.  Psychiatric: Normal mood and affect  Assessment & Plan:   See Encounters Tab for problem based charting.  Patient discussed with Dr. Lynnae January

## 2019-02-02 ENCOUNTER — Telehealth: Payer: Self-pay | Admitting: *Deleted

## 2019-02-02 ENCOUNTER — Other Ambulatory Visit: Payer: Self-pay | Admitting: *Deleted

## 2019-02-02 DIAGNOSIS — E1141 Type 2 diabetes mellitus with diabetic mononeuropathy: Secondary | ICD-10-CM

## 2019-02-02 IMAGING — CT CT RENAL STONE PROTOCOL
2 of 4 series · 16 of 46 positions shown, 18 images · non-contrast
Comparison: MRI of the lumbar spine February 11, 2018 and CT
abdomen and pelvis December 14, 2014

CLINICAL DATA: LEFT flank pain for months. History of chronic back
pain, hepatitis C, pancreatitis, diabetes.

EXAM:
CT ABDOMEN AND PELVIS WITHOUT CONTRAST
TECHNIQUE: Multidetector CT imaging of the abdomen and pelvis was performed
following the standard protocol without IV contrast.

[Series 3: renal stone 5.0 · axial · 0.80mm/px · z∈[+830,+1230]mm · 13 of 88 slices shown, 15 images]
[im 4/88  soft-tissue]
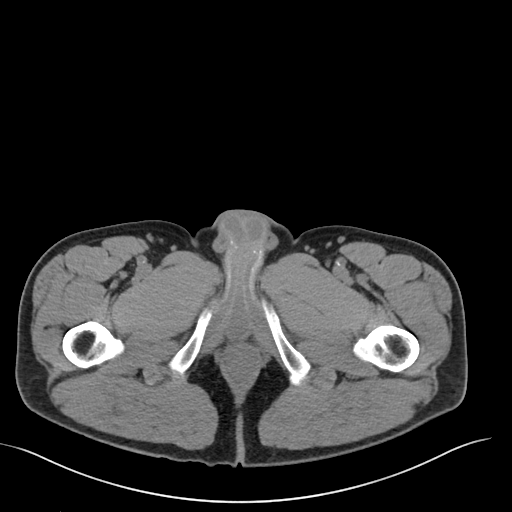
[im 4/88  bone]
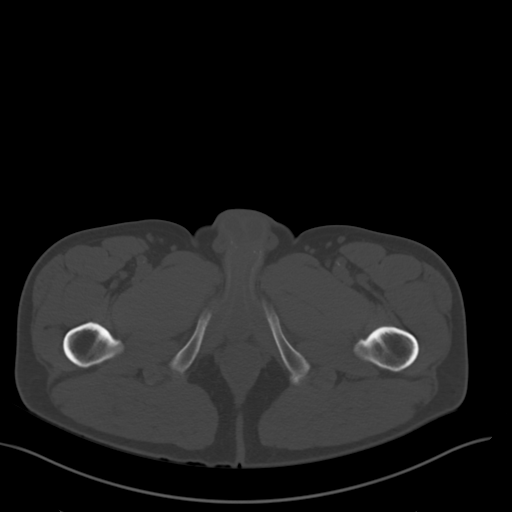
[im 11/88  soft-tissue]
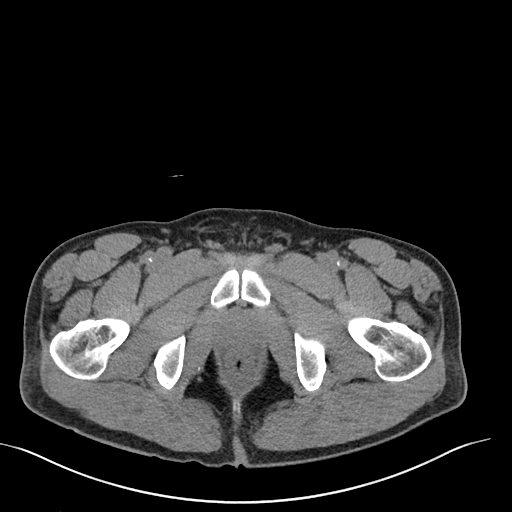
[im 17/88  soft-tissue]
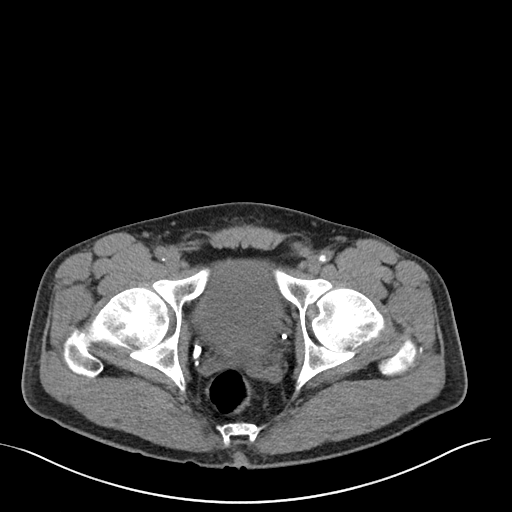
[im 24/88  soft-tissue]
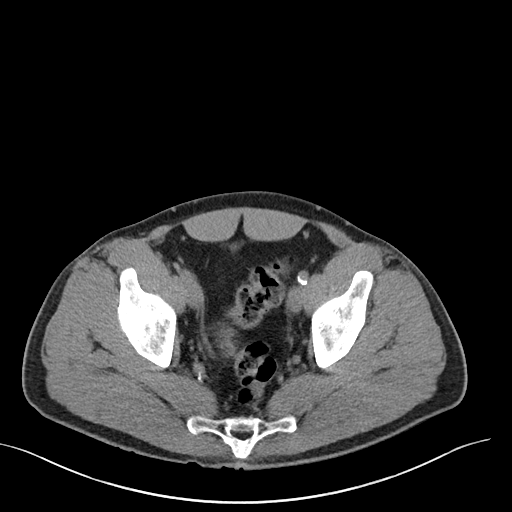
[im 31/88  soft-tissue]
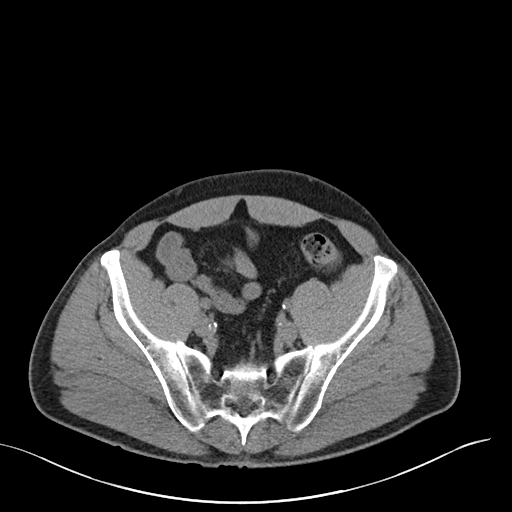
[im 37/88  soft-tissue]
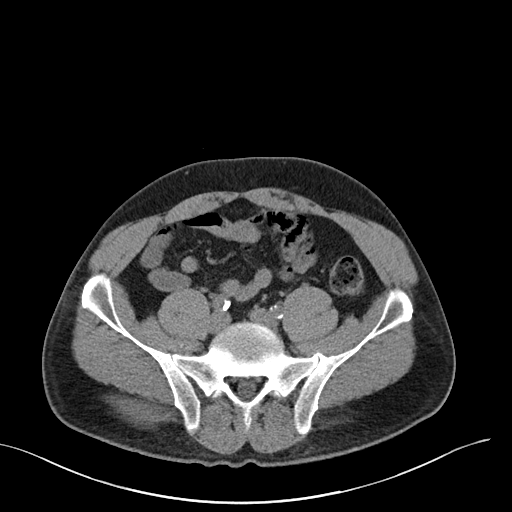
[im 44/88  soft-tissue]
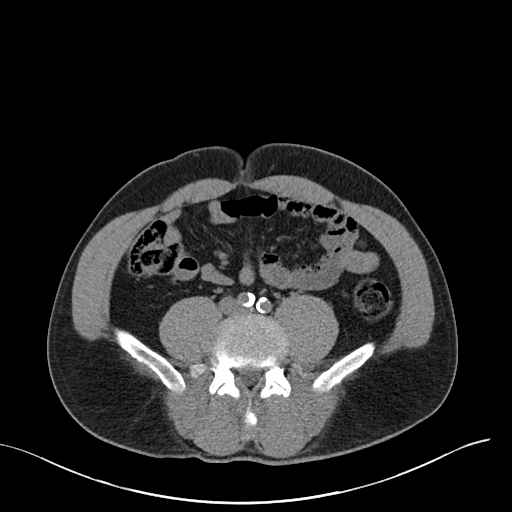
[im 51/88  soft-tissue]
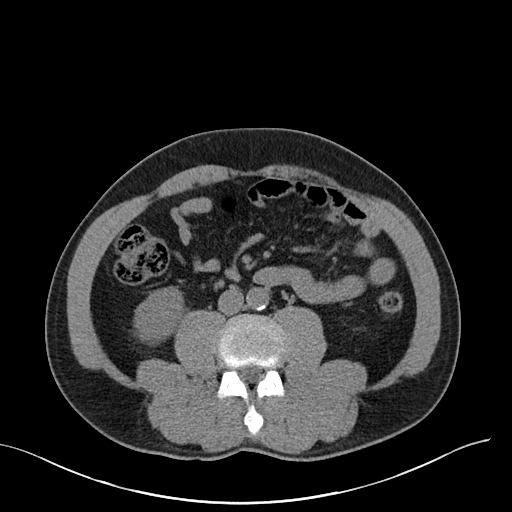
[im 57/88  soft-tissue]
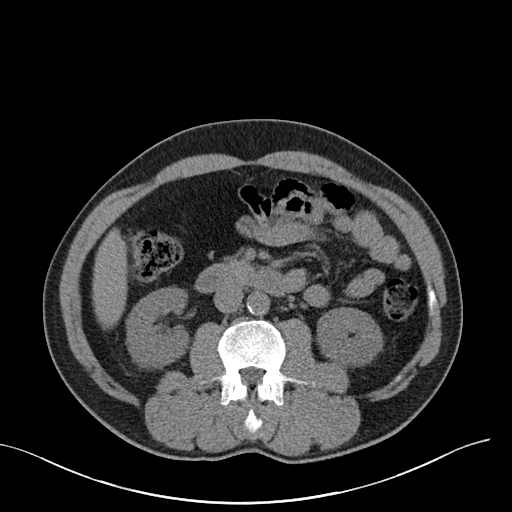
[im 57/88  bone]
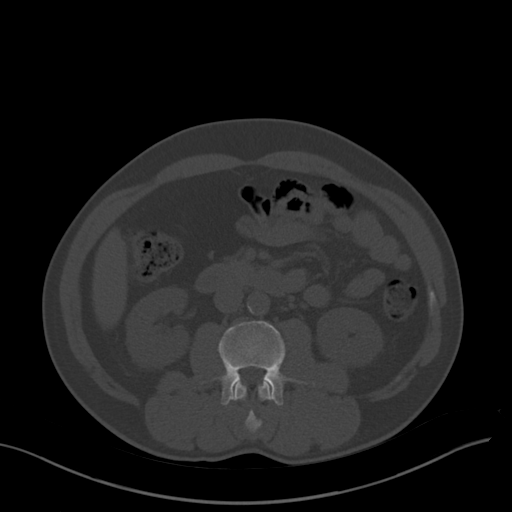
[im 64/88  soft-tissue]
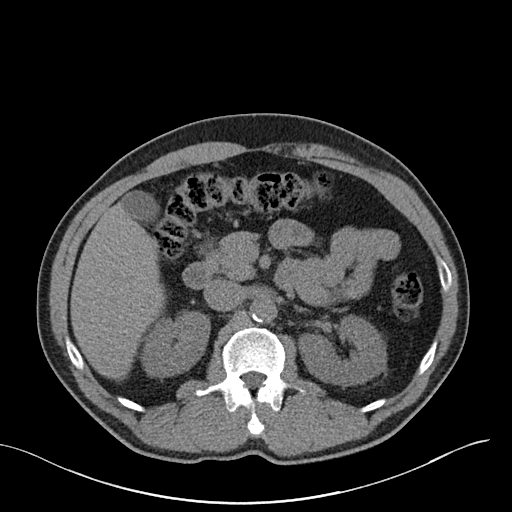
[im 71/88  soft-tissue]
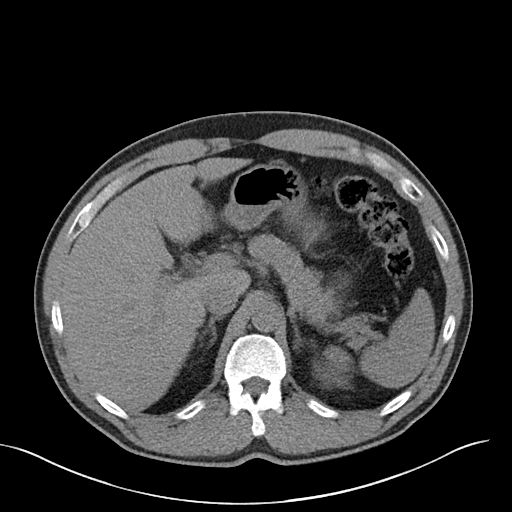
[im 77/88  soft-tissue]
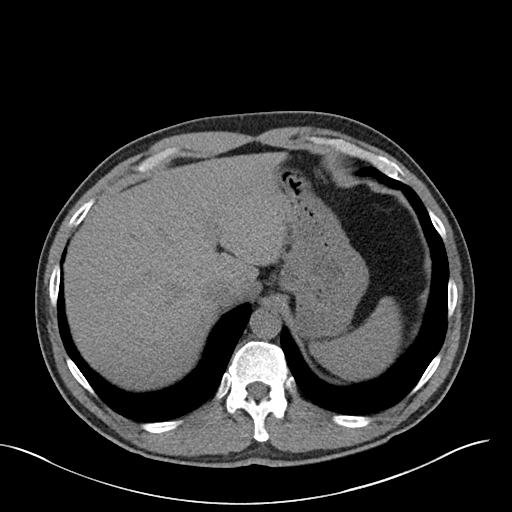
[im 84/88  soft-tissue]
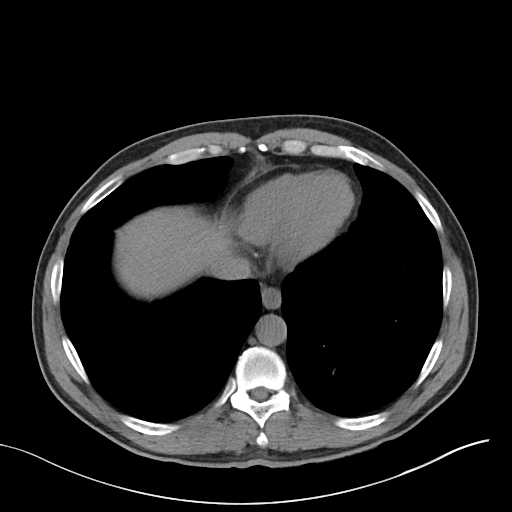

[Series 6: renal stone 3.0 cor · coronal · 0.78mm/px · 3 of 95 slices shown]
[im 32/95  soft-tissue]
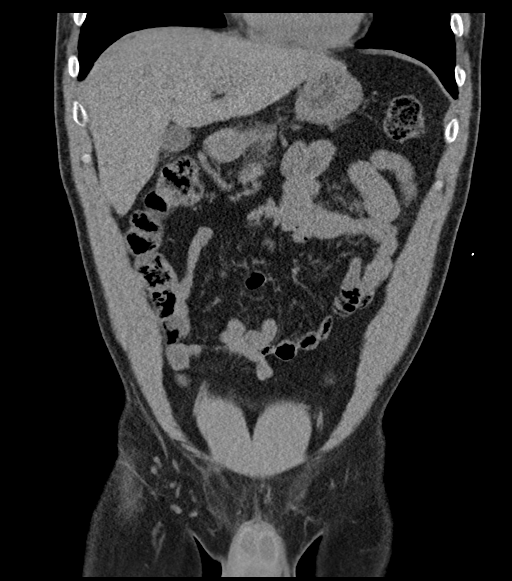
[im 42/95  soft-tissue]
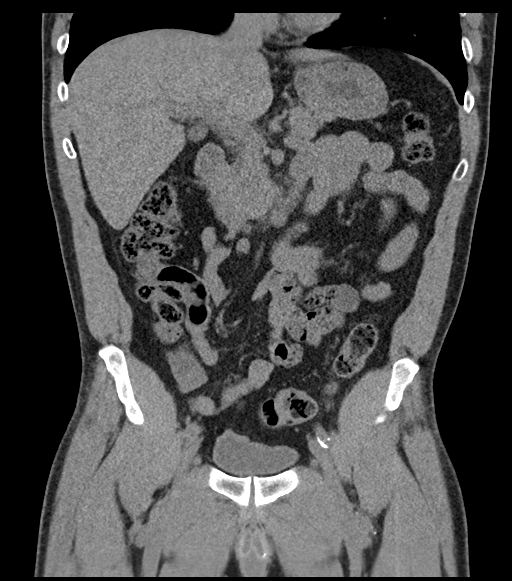
[im 53/95  soft-tissue]
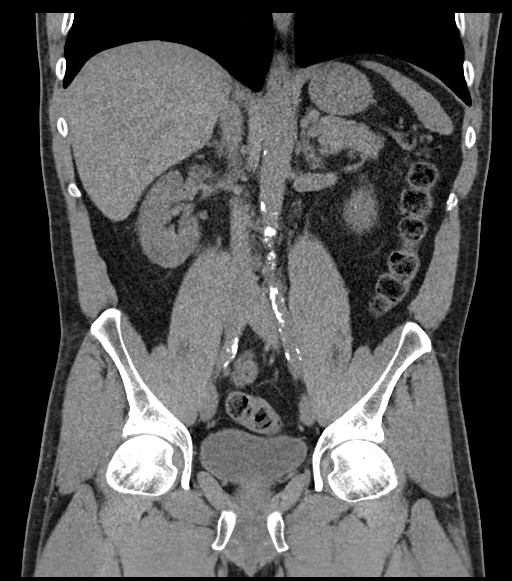

[16 of 46 positions shown; findings below may reference images not displayed]

FINDINGS: LOWER CHEST: Lung bases are clear. Included heart size is normal. No
pericardial effusion.

HEPATOBILIARY: Liver and gallbladder are normal.

PANCREAS: Normal.

SPLEEN: Normal.

ADRENALS/URINARY TRACT: Kidneys are orthotopic, demonstrating
symmetric enhancement. No nephrolithiasis, hydronephrosis or solid
renal masses. The unopacified ureters are normal in course and
caliber. Delayed imaging through the kidneys demonstrates symmetric
prompt contrast excretion within the proximal urinary collecting
system. Urinary bladder is partially distended and unremarkable.
Normal adrenal glands.

STOMACH/BOWEL: The stomach, small and large bowel are normal in
course and caliber without inflammatory changes. Small amount of
retained large bowel stool. Normal appendix.

VASCULAR/LYMPHATIC: Aortoiliac vessels are normal in course and
caliber. Mild calcific atherosclerosis. No lymphadenopathy by CT
size criteria.

REPRODUCTIVE: Mild prostatomegaly.

OTHER: No intraperitoneal free fluid or free air. Small bilateral
fat containing inguinal hernias.

MUSCULOSKELETAL: Nonacute.  RIGHT os acetabulum.
IMPRESSION: 1. No urolithiasis, obstructive uropathy or acute
intra-abdominal/pelvic process.
2. Small volume retained large bowel stool.  No bowel obstruction.
3.  Aortic Atherosclerosis ([id number]).

## 2019-02-02 MED ORDER — CARBAMAZEPINE ER 400 MG PO TB12: 400.0000 mg | ORAL_TABLET | Freq: Every day | ORAL | 1 refills | Status: DC

## 2019-02-02 MED ORDER — PREGABALIN 100 MG PO CAPS: 100.0000 mg | ORAL_CAPSULE | Freq: Two times a day (BID) | ORAL | 1 refills | Status: DC

## 2019-02-02 NOTE — Progress Notes (Signed)
Internal Medicine Clinic Attending  Case discussed with Dr. Philipp Ovens at the time of the visit.  We reviewed the resident's history and exam and pertinent patient test results.  I agree with the assessment, diagnosis, and plan of care documented in the resident's note.  I talked to Donald Palmer briefly after his appt and assured him I agreed with Dr Rivka Safer tx plan.

## 2019-02-05 ENCOUNTER — Encounter: Payer: Self-pay | Admitting: Internal Medicine

## 2019-02-05 ENCOUNTER — Ambulatory Visit (INDEPENDENT_AMBULATORY_CARE_PROVIDER_SITE_OTHER): Payer: Medicare HMO | Admitting: Internal Medicine

## 2019-02-05 VITALS — BP 130/77 | HR 94 | Temp 98.0°F | Resp 20 | Wt 195.0 lb

## 2019-02-05 DIAGNOSIS — J0101 Acute recurrent maxillary sinusitis: Secondary | ICD-10-CM | POA: Diagnosis not present

## 2019-02-05 DIAGNOSIS — E1141 Type 2 diabetes mellitus with diabetic mononeuropathy: Secondary | ICD-10-CM | POA: Diagnosis not present

## 2019-02-05 DIAGNOSIS — J301 Allergic rhinitis due to pollen: Secondary | ICD-10-CM | POA: Diagnosis not present

## 2019-02-05 DIAGNOSIS — G894 Chronic pain syndrome: Secondary | ICD-10-CM

## 2019-02-05 DIAGNOSIS — E1169 Type 2 diabetes mellitus with other specified complication: Secondary | ICD-10-CM | POA: Diagnosis not present

## 2019-02-05 DIAGNOSIS — Z0000 Encounter for general adult medical examination without abnormal findings: Secondary | ICD-10-CM

## 2019-02-05 DIAGNOSIS — I739 Peripheral vascular disease, unspecified: Secondary | ICD-10-CM | POA: Diagnosis not present

## 2019-02-05 DIAGNOSIS — E1140 Type 2 diabetes mellitus with diabetic neuropathy, unspecified: Secondary | ICD-10-CM | POA: Diagnosis not present

## 2019-02-05 DIAGNOSIS — K219 Gastro-esophageal reflux disease without esophagitis: Secondary | ICD-10-CM

## 2019-02-05 DIAGNOSIS — E785 Hyperlipidemia, unspecified: Secondary | ICD-10-CM

## 2019-02-05 DIAGNOSIS — I152 Hypertension secondary to endocrine disorders: Secondary | ICD-10-CM

## 2019-02-05 DIAGNOSIS — Z8679 Personal history of other diseases of the circulatory system: Secondary | ICD-10-CM

## 2019-02-05 MED ORDER — FLUTICASONE PROPIONATE 50 MCG/ACT NA SUSP: NASAL | 11 refills | Status: DC

## 2019-02-05 MED ORDER — PREGABALIN 50 MG PO CAPS: 50.0000 mg | ORAL_CAPSULE | Freq: Two times a day (BID) | ORAL | 1 refills | Status: DC

## 2019-02-05 MED ORDER — LISINOPRIL 20 MG PO TABS: 20.0000 mg | ORAL_TABLET | Freq: Every day | ORAL | 3 refills | Status: DC

## 2019-02-05 MED ORDER — KETOROLAC TROMETHAMINE 30 MG/ML IJ SOLN
30.0000 mg | Freq: Once | INTRAMUSCULAR | Status: AC
  Administered 2019-02-05: 30 mg via INTRAMUSCULAR

## 2019-02-05 MED ORDER — CARBAMAZEPINE ER 400 MG PO TB12: 400.0000 mg | ORAL_TABLET | Freq: Every day | ORAL | 1 refills | Status: DC

## 2019-02-05 NOTE — Assessment & Plan Note (Signed)
This problem is chronic and likely controlled.  He had 3 bottles of his PPI but was not taking them because he thought they were his blood clot medication.  If he is asymptomatic, he likely does not need a PPI.

## 2019-02-05 NOTE — Assessment & Plan Note (Signed)
This problem is chronic and uncontrolled.  He states he was requested by his vascular physician to follow-up but I do not see an appointment in epic.  He had 3 bottles of his Plavix and at least 1 if not 2 bottles of atorvastatin.  He did not have any bottles of aspirin.  He smokes on the weekdays and uses 7 cigarettes over a week.  He states he is not walking because the neuropathy hurts too bad.  When he walks, he gets left calf claudication.  We discussed that he needs to take his medication on a regular basis and to quit smoking.  He knows to follow-up with his vascular physician.  I collaborated with our clinical pharmacy team who went over all of his medications, disposed of expired medications, consolidated other medications, and completed medication education.  Hopefully, this will improve his compliance with his chronic medications.  PLAN:  Cont current meds

## 2019-02-05 NOTE — Assessment & Plan Note (Signed)
This problem is chronic and uncontrolled.  He requested and received a refill on his nasal corticosteroid today...  PLAN:  Cont current meds

## 2019-02-05 NOTE — Patient Instructions (Signed)
1. Sch an afternoon appt in one month 2, I sent in your allergy nose spray 3. I am sending you to neurology

## 2019-02-05 NOTE — Assessment & Plan Note (Signed)
He requested a work excuse for today and tomorrow which I provided.  He requested an afternoon follow-up appointment and I informed him he could but it would not be with me.  Follow-up will need to include an A1c, assess response to pregabalin and carbamazepine and whether he is taking them as prescribed, and status of neurology appointment and nerve conduction studies.  Additionally, I would ensure he has follow-up with his vascular surgeon due to the return of claudication.

## 2019-02-05 NOTE — Assessment & Plan Note (Signed)
This problem is chronic and uncontrolled.  He has known PAD.  He is supposed to be on Lipitor 80, a high intensity statin for secondary prevention but according to his bottles of medication, he is not compliant.  Education and medication reconciliation were completed today.  PLAN:  Cont current meds

## 2019-02-05 NOTE — Assessment & Plan Note (Signed)
This problem is chronic and moderately controlled.  He had bottles of lisinopril 20 mg but he is not taking this medication daily if at all.  Medication reconciliation was done today to try to improve compliance.  PLAN:  Cont current meds   BP Readings from Last 3 Encounters:  02/05/19 130/77  01/30/19 (!) 165/77  11/27/18 138/82

## 2019-02-05 NOTE — Assessment & Plan Note (Signed)
This problem is chronic and well controlled.  He is on liraglutide 1.2 daily.  He had a bottle of p.o. glitazone but told me he was not taking it.  His most recent A1c was 6.9.  He denies any hypoglycemic episodes.  His diabetes is complicated by diabetic neuropathy.  PLAN:  Cont current meds -liraglutide as monotherapy

## 2019-02-05 NOTE — Progress Notes (Signed)
   Subjective:    [name], male    DOB: 1961-10-30, 58 y.o.   MRN: [id number]  HPI  [name] is here for L thoracic pain. Please see the A&P for the status of the pt's chronic medical problems.  ROS : per ROS section and in problem oriented charting. All other systems are negative.  PMHx, Soc hx, and / or Fam hx : working part time as a Training and development officer.   Review of Systems  HENT: Positive for rhinorrhea.   Eyes: Positive for itching.  Cardiovascular:       + L calf claudication  Musculoskeletal: Positive for back pain.  Neurological: Negative for dizziness and light-headedness.  Psychiatric/Behavioral: Positive for sleep disturbance.       Objective:   Physical Exam Constitutional:      General: He is not in acute distress.    Appearance: Normal appearance. He is normal weight. He is not ill-appearing or diaphoretic.  HENT:     Head: Normocephalic and atraumatic.     Right Ear: External ear normal.     Left Ear: External ear normal.     Nose: Nose normal.  Eyes:     Extraocular Movements: Extraocular movements intact.     Conjunctiva/sclera: Conjunctivae normal.  Musculoskeletal: Normal range of motion.  Skin:    General: Skin is warm and dry.  Neurological:     General: No focal deficit present.     Mental Status: He is alert. Mental status is at baseline.     Comments: There is no skin discoloration over the posterior or anterior thoracic area.  There are no skin lesions.  He has full range of motion.  He was able to bend down and get under the computer to get a medicine bottle but had fallen.  He indicates sensitivity, burning, and tingling over the left T4 dermatome  Psychiatric:        Mood and Affect: Mood normal.        Behavior: Behavior normal.        Thought Content: Thought content normal.        Judgment: Judgment normal.           Assessment & Plan:

## 2019-02-05 NOTE — Assessment & Plan Note (Signed)
This problem is chronic and uncontrolled.  At our last visit, he only described discomfort over the scapular region.  When he recently saw Dr. Tobi Bastos, she was able to get a history of radicular pain in the T4 dermatome that extended anteriorly to the nipple.  He confirmed this additional history today.  He states none of the medicines are working.  He brought in all of his medications and he had 2 bottles of pregabalin- 25 mg and 50 mg.  Both bottles were full.  He had not been able to get to the drugstore to get the carbamazepine that was prescribed at his last appointment due to transportation issues. We discussed that these medicines will not work if not taken on a regular basis.  He remains concerned that he has not had pain relief.  I brought up a picture of thoracic dermatomes and we discussed his MRI results which were normal.  He requested and received a paper copy of his MRI results.  We discussed that this likely is a mononeuropathy of a peripheral nerve that could be diabetic related.  I discussed that since this is not a typical presentation, that I would get nerve conduction studies and refer him to neurology.  I discussed that the pregabalin and the carbamazepine are the best medications for this type of pain.  Since he is not taking the pregabalin, I backed down on the dose to 50 mg once a day and encouraged him to pick up the carbamazepine.  He requested and received a Toradol shot today.  PLAN :  Pregabalin 50 twice daily Carbamazepine extended release 400 a day Nerve conduction studies Neurology referral

## 2019-03-12 ENCOUNTER — Ambulatory Visit: Payer: Medicare HMO | Admitting: Internal Medicine

## 2019-04-01 ENCOUNTER — Ambulatory Visit (HOSPITAL_COMMUNITY)
Admission: RE | Admit: 2019-04-01 | Discharge: 2019-04-01 | Disposition: A | Discharge status: Home / Self Care | Payer: Medicare HMO | Source: Ambulatory Visit | Attending: Cardiovascular Disease | Admitting: Cardiovascular Disease

## 2019-04-01 ENCOUNTER — Other Ambulatory Visit: Payer: Self-pay

## 2019-04-01 DIAGNOSIS — Z0000 Encounter for general adult medical examination without abnormal findings: Secondary | ICD-10-CM | POA: Diagnosis not present

## 2019-04-01 DIAGNOSIS — I739 Peripheral vascular disease, unspecified: Secondary | ICD-10-CM | POA: Diagnosis not present

## 2019-04-01 DIAGNOSIS — I700 Atherosclerosis of aorta: Secondary | ICD-10-CM | POA: Diagnosis not present

## 2019-04-02 ENCOUNTER — Ambulatory Visit (HOSPITAL_COMMUNITY): Payer: Medicare HMO

## 2019-04-02 ENCOUNTER — Telehealth: Payer: Self-pay

## 2019-04-02 NOTE — Telephone Encounter (Signed)
I contacted the pt in regards to his 4/27 appt. Pt was advised, due to current COVID 19 pandemic, our office is severely reducing in office visits, in order to minimize the risk to our patients and healthcare providers.   Pt was offered a earlier new pt vv visit and declined. Pt requested to be scheduled for a face to face visit and I have scheduled him for July 2nd.

## 2019-04-06 ENCOUNTER — Telehealth: Payer: Self-pay | Admitting: *Deleted

## 2019-04-06 NOTE — Telephone Encounter (Signed)
LVM letting patient know that his appointment with Dr. Gwenlyn Found for tomorrow has been rescheduled for August 12, 2019 at 11:30 am.

## 2019-04-07 ENCOUNTER — Ambulatory Visit: Payer: Medicare HMO | Admitting: Cardiovascular Disease

## 2019-04-09 NOTE — Telephone Encounter (Signed)
review 

## 2019-04-13 ENCOUNTER — Ambulatory Visit: Payer: Medicaid Other | Admitting: Neurology

## 2019-05-07 ENCOUNTER — Ambulatory Visit: Payer: Medicare HMO | Admitting: Internal Medicine

## 2019-05-14 ENCOUNTER — Telehealth: Payer: Self-pay

## 2019-05-14 NOTE — Telephone Encounter (Signed)
Patient is doing well on his blood pressure and DM medications. He is more worried about a blood clot and back pain. Pt went to the wrong clinic for his last appt and missed his appt at our clinic. Will ask front desk to re-schedule his appointment.

## 2019-05-19 ENCOUNTER — Telehealth: Payer: Self-pay

## 2019-05-19 NOTE — Telephone Encounter (Signed)
Called pt to set up OV with Dr. Gwenlyn Found on 6/5 to review doppler results. Pt states still having pain in LLE. Informed pt to discuss this with Dr. Gwenlyn Found during 6/5 appt at 9:15am. Pt verbalized understanding.

## 2019-05-21 ENCOUNTER — Other Ambulatory Visit: Payer: Self-pay | Admitting: Internal Medicine

## 2019-05-22 ENCOUNTER — Ambulatory Visit: Payer: Medicare HMO | Admitting: Cardiovascular Disease

## 2019-05-22 NOTE — Telephone Encounter (Signed)
At his December and February appointment he stated this medication was not working.  He is now on pregabalin and carbamazepine.

## 2019-05-29 NOTE — Telephone Encounter (Signed)
Routed to [employer] - OK to put in DOD spot on 6/17?

## 2019-05-29 NOTE — Telephone Encounter (Signed)
Follow up   Patient states that he was not  aware of the appointment on 05/22/19. He wants to set up another appointment. Please call.

## 2019-06-03 ENCOUNTER — Encounter: Payer: Self-pay | Admitting: Cardiovascular Disease

## 2019-06-03 ENCOUNTER — Other Ambulatory Visit: Payer: Self-pay

## 2019-06-03 ENCOUNTER — Ambulatory Visit (INDEPENDENT_AMBULATORY_CARE_PROVIDER_SITE_OTHER): Payer: Medicare HMO | Admitting: Cardiovascular Disease

## 2019-06-03 ENCOUNTER — Telehealth: Payer: Self-pay | Admitting: *Deleted

## 2019-06-03 VITALS — BP 122/80 | HR 100 | Temp 98.2°F | Ht 69.0 in | Wt 181.0 lb

## 2019-06-03 DIAGNOSIS — Z01812 Encounter for preprocedural laboratory examination: Secondary | ICD-10-CM

## 2019-06-03 DIAGNOSIS — E1169 Type 2 diabetes mellitus with other specified complication: Secondary | ICD-10-CM

## 2019-06-03 DIAGNOSIS — E785 Hyperlipidemia, unspecified: Secondary | ICD-10-CM | POA: Diagnosis not present

## 2019-06-03 DIAGNOSIS — I739 Peripheral vascular disease, unspecified: Secondary | ICD-10-CM | POA: Diagnosis not present

## 2019-06-03 DIAGNOSIS — Z8679 Personal history of other diseases of the circulatory system: Secondary | ICD-10-CM

## 2019-06-03 LAB — BASIC METABOLIC PANEL
BUN/Creatinine Ratio: 9 (ref 9–20)
BUN: 9 mg/dL (ref 6–24)
CO2: 25 mmol/L (ref 20–29)
Calcium: 10.2 mg/dL (ref 8.7–10.2)
Chloride: 97 mmol/L (ref 96–106)
Creatinine, Ser: 1.04 mg/dL (ref 0.76–1.27)
GFR calc Af Amer: 91 mL/min/{1.73_m2} (ref >59)
GFR calc non Af Amer: 79 mL/min/{1.73_m2} (ref >59)
Glucose: 222 mg/dL — ABNORMAL HIGH (ref 65–99)
Potassium: 5.1 mmol/L (ref 3.5–5.2)
Sodium: 136 mmol/L (ref 134–144)

## 2019-06-03 LAB — CBC
Hematocrit: 45.4 % (ref 37.5–51.0)
Hemoglobin: 13.9 g/dL (ref 13.0–17.7)
MCH: 22.6 pg — ABNORMAL LOW (ref 26.6–33.0)
MCHC: 30.6 g/dL — ABNORMAL LOW (ref 31.5–35.7)
MCV: 74 fL — ABNORMAL LOW (ref 79–97)
Platelets: 269 10*3/uL (ref 150–450)
RBC: 6.15 x10E6/uL — ABNORMAL HIGH (ref 4.14–5.80)
RDW: 15.4 % (ref 11.6–15.4)
WBC: 6.4 10*3/uL (ref 3.4–10.8)

## 2019-06-03 LAB — TSH: TSH: 0.927 u[IU]/mL (ref 0.450–4.500)

## 2019-06-03 NOTE — Assessment & Plan Note (Signed)
History of PAD status post directional atherectomy and drug-coated balloon angioplasty of a left popliteal CTO by myself 03/03/2018 with three-vessel runoff.  His post procedure ABI improved 0. 83.  He has had recurrent symptoms since April with a decline in his ABI 0. .72 and what appears to be an occluded left popliteal artery.  He will need re-angiography and intervention intervention.

## 2019-06-03 NOTE — H&P (View-Only) (Signed)
06/03/2019 BRENEN BEIGEL   [birth year]  [id number]  Primary Physician [name], MD Primary Cardiologist: Lorretta Harp MD Lupe Carney, [place]  HPI:  Donald Palmer is a 58 y.o.  single African-American male father of 40, grandfather of 48 grandchildren referred by Dr. Lynnae January for cardiovascular vascular evaluation because of symptomatic left calf claudication.I last saw him in the office 03/26/2018. His left ABI was 0.66 with nuclear popliteal artery.He has a history of 40-pack-years of tobacco as well as treated hypertension, diabetes and hyperlipidemia. He has never had a heart attack or stroke. He denies chest pain or shortness of breath. He is disabled because of his back. He is complaining of left calf claudication with segmental pressures to suggest PAD.   I then performed peripheral angiography, directional atherectomy followed by drug-coated balloon angioplasty on 03/03/18 revealing occluded left popliteal artery in the P2 segment and perform directional atherectomy with drug-eluting balloon angioplasty. His follow-up left ABI improved 0.83 and his claudication has completely resolved.  Since I saw him in the office over a year ago he apparently did well until past April when he developed recurrent claudication and numbness in his left foot.  He has continued to smoke.  Follow-up Dopplers performed 04/01/2019 revealed a decline in his left ABI to 0.72 with an occluded distal left SFA and popliteal artery.  He denies chest pain or shortness of breath.  No outpatient medications have been marked as taking for the 06/03/19 encounter (Office Visit) with Lorretta Harp, MD.     Allergies  Allergen Reactions  . Metformin And Related Other (See Comments)    GI side effects. Stopped 2016    Social History   Socioeconomic History  . Marital status: Single    Spouse name: Not on file  . Number of children: Not on file  . Years of education: Not on file  . Highest  education level: Not on file  Occupational History  . Not on file  Social Needs  . Financial resource strain: Not on file  . Food insecurity    Worry: Not on file    Inability: Not on file  . Transportation needs    Medical: Not on file    Non-medical: Not on file  Tobacco Use  . Smoking status: Current Some Day Smoker    Packs/day: 0.10    Years: 30.00    Pack years: 3.00    Types: Cigarettes, Cigars  . Smokeless tobacco: Never Used  . Tobacco comment: 5 cigs per week  Substance and Sexual Activity  . Alcohol use: Yes    Alcohol/week: 5.0 standard drinks    Types: 5 Cans of beer per week    Comment: Beer.  . Drug use: Yes    Frequency: 1.0 times per week    Types: Marijuana    Comment: rarely  . Sexual activity: Yes  Lifestyle  . Physical activity    Days per week: Not on file    Minutes per session: Not on file  . Stress: Not on file  Relationships  . Social Herbalist on phone: Not on file    Gets together: Not on file    Attends religious service: Not on file    Active member of club or organization: Not on file    Attends meetings of clubs or organizations: Not on file    Relationship status: Not on file  . Intimate partner violence  Fear of current or ex partner: Not on file    Emotionally abused: Not on file    Physically abused: Not on file    Forced sexual activity: Not on file  Other Topics Concern  . Not on file  Social History Narrative   Prior incarceration. Tattoos, inc ones obtained in prison.    On disability since 09/2013     Review of Systems: General: negative for chills, fever, night sweats or weight changes.  Cardiovascular: negative for chest pain, dyspnea on exertion, edema, orthopnea, palpitations, paroxysmal nocturnal dyspnea or shortness of breath Dermatological: negative for rash Respiratory: negative for cough or wheezing Urologic: negative for hematuria Abdominal: negative for nausea, vomiting, diarrhea, bright red  blood per rectum, melena, or hematemesis Neurologic: negative for visual changes, syncope, or dizziness All other systems reviewed and are otherwise negative except as noted above.    Blood pressure 122/80, pulse 100, temperature 98.2 F (36.8 C), height 5\' 9"  (1.753 m), weight 181 lb (82.1 kg).  General appearance: alert and no distress Neck: no adenopathy, no carotid bruit, no JVD, supple, symmetrical, trachea midline and thyroid not enlarged, symmetric, no tenderness/mass/nodules Lungs: clear to auscultation bilaterally Heart: regular rate and rhythm, S1, S2 normal, no murmur, click, rub or gallop Extremities: extremities normal, atraumatic, no cyanosis or edema Pulses: 2+ and symmetric Skin: Skin color, texture, turgor normal. No rashes or lesions Neurologic: Alert and oriented X 3, normal strength and tone. Normal symmetric reflexes. Normal coordination and gait  EKG sinus tachycardia at 100 with septal Q waves and inferior Q waves.  I personally reviewed this EKG.  ASSESSMENT AND PLAN:   History of hypertension  History of essential hypertension with blood pressure measured today in the office of 122/80.  He is on lisinopril.  Hyperlipidemia associated with type 2 diabetes mellitus ([facility]) History of hyperlipidemia on statin therapy revealing total cholesterol 148, LDL of 88 and HDL 38  PAD (peripheral artery disease) ([facility]) History of PAD status post directional atherectomy and drug-coated balloon angioplasty of a left popliteal CTO by myself 03/03/2018 with three-vessel runoff.  His post procedure ABI improved 0. 83.  He has had recurrent symptoms since April with a decline in his ABI 0. .72 and what appears to be an occluded left popliteal artery.  He will need re-angiography and intervention intervention.      Lorretta Harp MD FACP,FACC,FAHA, [facility] 06/03/2019 10:50 AM

## 2019-06-03 NOTE — Patient Instructions (Addendum)
    [address] [place] [address] Dept: [phone] Loc: [place]  06/03/2019  You are scheduled for a Peripheral Angiogram on Monday, June 29 with Dr. Quay Burow.  1. Please arrive at the [facility] (Main Entrance A) at [facility]: [address] [place], [place] [address] at 5:30 AM (This time is two hours before your procedure to ensure your preparation). Free valet parking service is available.   Special note: Every effort is made to have your procedure done on time. Please understand that emergencies sometimes delay scheduled procedures.  2. Diet: Do not eat solid foods after midnight.  The patient may have clear liquids until 5am upon the day of the procedure.  3. Labs: You will need to have blood drawn TODAY: CBC, BMP, TSH  Go to: [facility]  [address] [place], [place] [address] FOR YOUR COVID-19 TEST. YOU MUST HAVE YOUR COVID-19 TEST COMPLETED 3 DAYS PRIOR TO YOUR UPCOMING PROCEDURE/TEST. YOUR APPOINTMENT IS 06/10/2019 AT 9:10AM. YOU WILL ALSO NEED TO QUARANTINE YOURSELF AFTER THE COVID-19 TEST UNTIL THE DAY OF YOUR PROCEDURE/TEST. RESULTS WILL  BE POSTED IN OUR SYSTEM IN 48 HOURS OR LESS.   4. Medication instructions in preparation for your procedure:  HOLD YOUR LIRAGLUTIDE (VICTOZA) ON THE MORNING OF YOUR PROCEDURE  On the morning of your procedure, take your Plavix/Clopidogrel and any morning medicines NOT listed above.  You may use sips of water.  5. Plan for one night stay--bring personal belongings. 6. Bring a current list of your medications and current insurance cards. 7. You MUST have a responsible person to drive you home. 8. Someone MUST be with you the first 24 hours after you arrive home or your discharge will be delayed. 9. Please wear clothes that are easy to get on and off  and wear slip-on shoes.   Testing/Procedures: Your physician has requested that you have a lower or upper extremity arterial duplex. This test is an ultrasound of the arteries in the legs or arms. It looks at arterial blood flow in the legs and arms. Allow one hour for Lower and Upper Arterial scans. There are no restrictions or special instructions. [facility] has requested that you have an ankle brachial index (ABI). During this test an ultrasound and blood pressure cuff are used to evaluate the arteries that supply the arms and legs with blood. Allow thirty minutes for this exam. There are no restrictions or special instructions. 2 WEEKS POST PROCEDURE  Follow-Up: At [facility], you and your health needs are our priority.  As part of our continuing mission to provide you with exceptional heart care, we have created designated Provider Care Teams.  These Care Teams include your primary Cardiologist (physician) and Advanced Practice Providers (APPs -  Physician Assistants and Nurse Practitioners) who all work together to provide you with the care you need, when you need it. You will need a follow up appointment 3 weeks AFTER PROCEDURE WITH DR. Gwenlyn Found.

## 2019-06-03 NOTE — Telephone Encounter (Signed)
Pt calls from dr berry's office, states dr berry told him he needs to see "doc butcher" as soon as he can, pt has a nodule on bottom of foot that is making his entire leg hurt, dr berry told him there is nothing he can do but "Nurse, learning disability" should see him. Your schedule is full till 7/2, pt refused that, stating" nurse helen this is urgent" please advise

## 2019-06-03 NOTE — Telephone Encounter (Signed)
I do not have any July appt as on inpt. [facility] appt

## 2019-06-03 NOTE — Assessment & Plan Note (Signed)
History of essential hypertension with blood pressure measured today in the office of 122/80.  He is on lisinopril.

## 2019-06-03 NOTE — Assessment & Plan Note (Signed)
History of hyperlipidemia on statin therapy revealing total cholesterol 148, LDL of 88 and HDL 38

## 2019-06-03 NOTE — Progress Notes (Signed)
06/03/2019 Donald Palmer   01-Feb-1961  [id number]  Primary Physician [name], MD Primary Cardiologist: Lorretta Harp MD Lupe Carney, [place]  HPI:  Donald Palmer is a 58 y.o.  single African-American male father of 65, grandfather of 28 grandchildren referred by Dr. Lynnae January for cardiovascular vascular evaluation because of symptomatic left calf claudication.I last saw him in the office 03/26/2018. His left ABI was 0.66 with nuclear popliteal artery.He has a history of 40-pack-years of tobacco as well as treated hypertension, diabetes and hyperlipidemia. He has never had a heart attack or stroke. He denies chest pain or shortness of breath. He is disabled because of his back. He is complaining of left calf claudication with segmental pressures to suggest PAD.   I then performed peripheral angiography, directional atherectomy followed by drug-coated balloon angioplasty on 03/03/18 revealing occluded left popliteal artery in the P2 segment and perform directional atherectomy with drug-eluting balloon angioplasty. His follow-up left ABI improved 0.83 and his claudication has completely resolved.  Since I saw him in the office over a year ago he apparently did well until past April when he developed recurrent claudication and numbness in his left foot.  He has continued to smoke.  Follow-up Dopplers performed 04/01/2019 revealed a decline in his left ABI to 0.72 with an occluded distal left SFA and popliteal artery.  He denies chest pain or shortness of breath.  No outpatient medications have been marked as taking for the 06/03/19 encounter (Office Visit) with Lorretta Harp, MD.     Allergies  Allergen Reactions  . Metformin And Related Other (See Comments)    GI side effects. Stopped 2016    Social History   Socioeconomic History  . Marital status: Single    Spouse name: Not on file  . Number of children: Not on file  . Years of education: Not on file  . Highest  education level: Not on file  Occupational History  . Not on file  Social Needs  . Financial resource strain: Not on file  . Food insecurity    Worry: Not on file    Inability: Not on file  . Transportation needs    Medical: Not on file    Non-medical: Not on file  Tobacco Use  . Smoking status: Current Some Day Smoker    Packs/day: 0.10    Years: 30.00    Pack years: 3.00    Types: Cigarettes, Cigars  . Smokeless tobacco: Never Used  . Tobacco comment: 5 cigs per week  Substance and Sexual Activity  . Alcohol use: Yes    Alcohol/week: 5.0 standard drinks    Types: 5 Cans of beer per week    Comment: Beer.  . Drug use: Yes    Frequency: 1.0 times per week    Types: Marijuana    Comment: rarely  . Sexual activity: Yes  Lifestyle  . Physical activity    Days per week: Not on file    Minutes per session: Not on file  . Stress: Not on file  Relationships  . Social Herbalist on phone: Not on file    Gets together: Not on file    Attends religious service: Not on file    Active member of club or organization: Not on file    Attends meetings of clubs or organizations: Not on file    Relationship status: Not on file  . Intimate partner violence  Fear of current or ex partner: Not on file    Emotionally abused: Not on file    Physically abused: Not on file    Forced sexual activity: Not on file  Other Topics Concern  . Not on file  Social History Narrative   Prior incarceration. Tattoos, inc ones obtained in prison.    On disability since 09/2013     Review of Systems: General: negative for chills, fever, night sweats or weight changes.  Cardiovascular: negative for chest pain, dyspnea on exertion, edema, orthopnea, palpitations, paroxysmal nocturnal dyspnea or shortness of breath Dermatological: negative for rash Respiratory: negative for cough or wheezing Urologic: negative for hematuria Abdominal: negative for nausea, vomiting, diarrhea, bright red  blood per rectum, melena, or hematemesis Neurologic: negative for visual changes, syncope, or dizziness All other systems reviewed and are otherwise negative except as noted above.    Blood pressure 122/80, pulse 100, temperature 98.2 F (36.8 C), height 5\' 9"  (1.753 m), weight 181 lb (82.1 kg).  General appearance: alert and no distress Neck: no adenopathy, no carotid bruit, no JVD, supple, symmetrical, trachea midline and thyroid not enlarged, symmetric, no tenderness/mass/nodules Lungs: clear to auscultation bilaterally Heart: regular rate and rhythm, S1, S2 normal, no murmur, click, rub or gallop Extremities: extremities normal, atraumatic, no cyanosis or edema Pulses: 2+ and symmetric Skin: Skin color, texture, turgor normal. No rashes or lesions Neurologic: Alert and oriented X 3, normal strength and tone. Normal symmetric reflexes. Normal coordination and gait  EKG sinus tachycardia at 100 with septal Q waves and inferior Q waves.  I personally reviewed this EKG.  ASSESSMENT AND PLAN:   History of hypertension  History of essential hypertension with blood pressure measured today in the office of 122/80.  He is on lisinopril.  Hyperlipidemia associated with type 2 diabetes mellitus ([facility]) History of hyperlipidemia on statin therapy revealing total cholesterol 148, LDL of 88 and HDL 38  PAD (peripheral artery disease) (HCC) History of PAD status post directional atherectomy and drug-coated balloon angioplasty of a left popliteal CTO by myself 03/03/2018 with three-vessel runoff.  His post procedure ABI improved 0. 83.  He has had recurrent symptoms since April with a decline in his ABI 0. .72 and what appears to be an occluded left popliteal artery.  He will need re-angiography and intervention intervention.      Lorretta Harp MD FACP,FACC,FAHA, [facility] 06/03/2019 10:50 AM

## 2019-06-09 DIAGNOSIS — H04123 Dry eye syndrome of bilateral lacrimal glands: Secondary | ICD-10-CM | POA: Diagnosis not present

## 2019-06-10 ENCOUNTER — Other Ambulatory Visit: Payer: Self-pay

## 2019-06-10 ENCOUNTER — Telehealth (HOSPITAL_COMMUNITY): Payer: Self-pay | Admitting: *Deleted

## 2019-06-10 ENCOUNTER — Other Ambulatory Visit (HOSPITAL_COMMUNITY): Payer: Medicare HMO

## 2019-06-10 DIAGNOSIS — I739 Peripheral vascular disease, unspecified: Secondary | ICD-10-CM

## 2019-06-10 LAB — HM DIABETES EYE EXAM

## 2019-06-11 ENCOUNTER — Telehealth: Payer: Self-pay | Admitting: *Deleted

## 2019-06-11 NOTE — Telephone Encounter (Signed)
Pt contacted pre-catheterization scheduled at [facility] for: Monday June 15, 2019 7:30 AM Verified arrival time and place: [facility] Entrance A at: 5:30 AM  Covid-19 test date: 06/12/19  No solid food after midnight prior to cath, clear liquids until 5 AM day of procedure. Contrast allergy: no  Hold: Insulin-AM of procedure. Pt states he does not take Victoza.  Except hold medications AM meds can be  taken pre-cath with sip of water including: ASA 81 mg Plavix 75 mg  Pt aware he will not be able to drive home and need someone to observe 24 hours after procedure.  Due to Covid-19 pandemic no visitors are allowed in the hospital (unless cognitive impairment).  Their designated party will be called when their procedure is over for an update and to arrange pick up.  Patients are required to wear a mask when they enter the hospital.  I reviewed procedure instructions/mask/visitor information with patient, he verbalized understanding.      Confirmed patient has responsible person to drive home post procedure and observe 24 hours after arriving home:

## 2019-06-11 NOTE — Telephone Encounter (Signed)
Spoke with pt and rescheduled pt for 6/26 at 9:25am. Pt aware to present to [facility]  [address] [place], [place] [address] FOR COVID-19 TEST

## 2019-06-11 NOTE — Telephone Encounter (Signed)
    COVID-19 Pre-Screening Questions:  . In the past 7 to 10 days have you had a cough,  shortness of breath, headache, congestion, fever (100 or greater) body aches, chills, sore throat, or sudden loss of taste or sense of smell? no . Have you been around anyone with known Covid 19? no . Have you been around anyone who is awaiting Covid 19 test results in the past 7 to 10 days? no . Have you been around anyone who has been exposed to Covid 19, or has mentioned symptoms of Covid 19 within the past 7 to 10 days? no   I reviewed Covid-19 screening questions with patient.

## 2019-06-11 NOTE — Telephone Encounter (Signed)
Pt scheduled for abdominal aortogram 06/15/19 Dr Quay Burow. Appt for pre procedure Covid-19 06/10/19 was cancelled, reason patient.  I called home/mobile phone number listed. I received voice mail message-- left message on voice mail for patient that Covid-19 needed to be done today or tomorrow, call our office to schedule appt.  I will forward to Dr Leticia Clas for review.

## 2019-06-12 ENCOUNTER — Other Ambulatory Visit (HOSPITAL_COMMUNITY)
Admission: RE | Admit: 2019-06-12 | Discharge: 2019-06-12 | Disposition: A | Discharge status: Home / Self Care | Payer: Medicare HMO | Source: Ambulatory Visit | Attending: Cardiovascular Disease | Admitting: Cardiovascular Disease

## 2019-06-12 DIAGNOSIS — Z1159 Encounter for screening for other viral diseases: Secondary | ICD-10-CM | POA: Insufficient documentation

## 2019-06-12 LAB — SARS CORONAVIRUS 2 (TAT 6-24 HRS): SARS Coronavirus 2: NEGATIVE

## 2019-06-15 ENCOUNTER — Telehealth: Payer: Self-pay | Admitting: Neurology

## 2019-06-15 ENCOUNTER — Encounter (HOSPITAL_COMMUNITY)
Admission: RE | Disposition: A | Discharge status: Home / Self Care | Payer: Self-pay | Source: Home / Self Care | Attending: Cardiovascular Disease

## 2019-06-15 ENCOUNTER — Encounter (HOSPITAL_COMMUNITY): Payer: Self-pay | Admitting: Cardiovascular Disease

## 2019-06-15 ENCOUNTER — Ambulatory Visit (HOSPITAL_COMMUNITY)
Admission: RE | Admit: 2019-06-15 | Discharge: 2019-06-16 | Disposition: A | Discharge status: Home / Self Care | Payer: Medicare HMO | Attending: Cardiovascular Disease | Admitting: Cardiovascular Disease

## 2019-06-15 ENCOUNTER — Other Ambulatory Visit: Payer: Self-pay

## 2019-06-15 DIAGNOSIS — E785 Hyperlipidemia, unspecified: Secondary | ICD-10-CM | POA: Diagnosis not present

## 2019-06-15 DIAGNOSIS — F17210 Nicotine dependence, cigarettes, uncomplicated: Secondary | ICD-10-CM | POA: Diagnosis not present

## 2019-06-15 DIAGNOSIS — Z79899 Other long term (current) drug therapy: Secondary | ICD-10-CM | POA: Insufficient documentation

## 2019-06-15 DIAGNOSIS — E1169 Type 2 diabetes mellitus with other specified complication: Secondary | ICD-10-CM | POA: Diagnosis not present

## 2019-06-15 DIAGNOSIS — Z7902 Long term (current) use of antithrombotics/antiplatelets: Secondary | ICD-10-CM | POA: Insufficient documentation

## 2019-06-15 DIAGNOSIS — I10 Essential (primary) hypertension: Secondary | ICD-10-CM | POA: Insufficient documentation

## 2019-06-15 DIAGNOSIS — Z7982 Long term (current) use of aspirin: Secondary | ICD-10-CM | POA: Insufficient documentation

## 2019-06-15 DIAGNOSIS — I739 Peripheral vascular disease, unspecified: Secondary | ICD-10-CM | POA: Diagnosis present

## 2019-06-15 DIAGNOSIS — I7092 Chronic total occlusion of artery of the extremities: Secondary | ICD-10-CM

## 2019-06-15 DIAGNOSIS — E1151 Type 2 diabetes mellitus with diabetic peripheral angiopathy without gangrene: Secondary | ICD-10-CM | POA: Diagnosis not present

## 2019-06-15 DIAGNOSIS — Z794 Long term (current) use of insulin: Secondary | ICD-10-CM | POA: Insufficient documentation

## 2019-06-15 DIAGNOSIS — I70203 Unspecified atherosclerosis of native arteries of extremities, bilateral legs: Secondary | ICD-10-CM

## 2019-06-15 HISTORY — PX: ABDOMINAL AORTOGRAM W/LOWER EXTREMITY: CATH118223

## 2019-06-15 HISTORY — PX: PERIPHERAL VASCULAR INTERVENTION: CATH118257

## 2019-06-15 LAB — POCT ACTIVATED CLOTTING TIME
Activated Clotting Time: 230 seconds
Activated Clotting Time: 296 seconds
Activated Clotting Time: 202 seconds
Activated Clotting Time: 164 seconds

## 2019-06-15 LAB — GLUCOSE, CAPILLARY
Glucose-Capillary: 145 mg/dL — ABNORMAL HIGH (ref 70–99)
Glucose-Capillary: 109 mg/dL — ABNORMAL HIGH (ref 70–99)
Glucose-Capillary: 131 mg/dL — ABNORMAL HIGH (ref 70–99)

## 2019-06-15 SURGERY — ABDOMINAL AORTOGRAM W/LOWER EXTREMITY
Anesthesia: LOCAL

## 2019-06-15 MED ORDER — SODIUM CHLORIDE 0.9% FLUSH: 3.0000 mL | Freq: Two times a day (BID) | INTRAVENOUS | Status: DC

## 2019-06-15 MED ORDER — PREGABALIN 50 MG PO CAPS
100.0000 mg | ORAL_CAPSULE | Freq: Two times a day (BID) | ORAL | Status: DC
  Administered 2019-06-15 – 2019-06-16 (×3): 100 mg via ORAL
  Filled 2019-06-15 (×3): qty 2

## 2019-06-15 MED ORDER — SODIUM CHLORIDE 0.9 % IV SOLN: INTRAVENOUS | Status: AC

## 2019-06-15 MED ORDER — SODIUM CHLORIDE 0.9 % IV SOLN: 250.0000 mL | INTRAVENOUS | Status: DC | PRN

## 2019-06-15 MED ORDER — LABETALOL HCL 5 MG/ML IV SOLN
10.0000 mg | INTRAVENOUS | Status: DC | PRN
  Administered 2019-06-15: 10 mg via INTRAVENOUS

## 2019-06-15 MED ORDER — SODIUM CHLORIDE 0.9 % WEIGHT BASED INFUSION
3.0000 mL/kg/h | INTRAVENOUS | Status: DC
  Administered 2019-06-15: 3 mL/kg/h via INTRAVENOUS

## 2019-06-15 MED ORDER — LIDOCAINE HCL (PF) 1 % IJ SOLN
INTRAMUSCULAR | Status: DC | PRN
  Administered 2019-06-15: 20 mL via INTRADERMAL

## 2019-06-15 MED ORDER — ACETAMINOPHEN 325 MG PO TABS
650.0000 mg | ORAL_TABLET | ORAL | Status: DC | PRN
  Administered 2019-06-15: 21:00:00 650 mg via ORAL
  Filled 2019-06-15: qty 2

## 2019-06-15 MED ORDER — LISINOPRIL 20 MG PO TABS
20.0000 mg | ORAL_TABLET | Freq: Every day | ORAL | Status: DC
  Administered 2019-06-16: 20 mg via ORAL
  Filled 2019-06-15: qty 1

## 2019-06-15 MED ORDER — SODIUM CHLORIDE 0.9 % WEIGHT BASED INFUSION: 1.0000 mL/kg/h | INTRAVENOUS | Status: DC

## 2019-06-15 MED ORDER — FENTANYL CITRATE (PF) 100 MCG/2ML IJ SOLN
INTRAMUSCULAR | Status: DC | PRN
  Administered 2019-06-15 (×2): 25 ug via INTRAVENOUS

## 2019-06-15 MED ORDER — HEPARIN SODIUM (PORCINE) 1000 UNIT/ML IJ SOLN
INTRAMUSCULAR | Status: DC | PRN
  Administered 2019-06-15: 9000 [IU] via INTRAVENOUS

## 2019-06-15 MED ORDER — HEPARIN SODIUM (PORCINE) 1000 UNIT/ML IJ SOLN
INTRAMUSCULAR | Status: AC
  Filled 2019-06-15: qty 1

## 2019-06-15 MED ORDER — ASPIRIN EC 81 MG PO TBEC: 81.0000 mg | DELAYED_RELEASE_TABLET | Freq: Every day | ORAL | Status: DC

## 2019-06-15 MED ORDER — LIDOCAINE HCL (PF) 1 % IJ SOLN
INTRAMUSCULAR | Status: AC
  Filled 2019-06-15: qty 30

## 2019-06-15 MED ORDER — ASPIRIN 81 MG PO CHEW: 81.0000 mg | CHEWABLE_TABLET | ORAL | Status: DC

## 2019-06-15 MED ORDER — LABETALOL HCL 5 MG/ML IV SOLN
INTRAVENOUS | Status: AC
  Filled 2019-06-15: qty 4

## 2019-06-15 MED ORDER — ACCU-CHEK AVIVA PLUS W/DEVICE KIT: 1.0000 | PACK | Freq: Two times a day (BID) | Status: DC

## 2019-06-15 MED ORDER — LIRAGLUTIDE 18 MG/3ML ~~LOC~~ SOPN: 1.2000 mg | PEN_INJECTOR | Freq: Every day | SUBCUTANEOUS | Status: DC

## 2019-06-15 MED ORDER — SODIUM CHLORIDE 0.9% FLUSH: 3.0000 mL | INTRAVENOUS | Status: DC | PRN

## 2019-06-15 MED ORDER — HEPARIN (PORCINE) IN NACL 1000-0.9 UT/500ML-% IV SOLN
INTRAVENOUS | Status: DC | PRN
  Administered 2019-06-15 (×2): 500 mL

## 2019-06-15 MED ORDER — ANGIOPLASTY BOOK
Freq: Once | Status: AC
  Administered 2019-06-16: 06:00:00
  Filled 2019-06-15: qty 1

## 2019-06-15 MED ORDER — HEPARIN (PORCINE) IN NACL 1000-0.9 UT/500ML-% IV SOLN
INTRAVENOUS | Status: AC
  Filled 2019-06-15: qty 1000

## 2019-06-15 MED ORDER — CLOPIDOGREL BISULFATE 75 MG PO TABS: 75.0000 mg | ORAL_TABLET | Freq: Every day | ORAL | Status: DC

## 2019-06-15 MED ORDER — ASPIRIN EC 81 MG PO TBEC
81.0000 mg | DELAYED_RELEASE_TABLET | Freq: Every day | ORAL | Status: DC
  Administered 2019-06-16: 81 mg via ORAL
  Filled 2019-06-15: qty 1

## 2019-06-15 MED ORDER — IODIXANOL 320 MG/ML IV SOLN
INTRAVENOUS | Status: DC | PRN
  Administered 2019-06-15: 200 mL via INTRA_ARTERIAL

## 2019-06-15 MED ORDER — SODIUM CHLORIDE 0.9% FLUSH
3.0000 mL | Freq: Two times a day (BID) | INTRAVENOUS | Status: DC
  Administered 2019-06-15 – 2019-06-16 (×2): 3 mL via INTRAVENOUS

## 2019-06-15 MED ORDER — CARBAMAZEPINE ER 200 MG PO TB12
400.0000 mg | ORAL_TABLET | Freq: Every day | ORAL | Status: DC
  Administered 2019-06-15 – 2019-06-16 (×2): 400 mg via ORAL
  Filled 2019-06-15 (×2): qty 2

## 2019-06-15 MED ORDER — CLOPIDOGREL BISULFATE 300 MG PO TABS
ORAL_TABLET | ORAL | Status: DC | PRN
  Administered 2019-06-15: 300 mg via ORAL

## 2019-06-15 MED ORDER — FENTANYL CITRATE (PF) 100 MCG/2ML IJ SOLN
INTRAMUSCULAR | Status: AC
  Filled 2019-06-15: qty 2

## 2019-06-15 MED ORDER — PANTOPRAZOLE SODIUM 40 MG PO TBEC
40.0000 mg | DELAYED_RELEASE_TABLET | Freq: Every day | ORAL | Status: DC
  Administered 2019-06-16: 40 mg via ORAL
  Filled 2019-06-15: qty 1

## 2019-06-15 MED ORDER — ONDANSETRON HCL 4 MG/2ML IJ SOLN: 4.0000 mg | Freq: Four times a day (QID) | INTRAMUSCULAR | Status: DC | PRN

## 2019-06-15 MED ORDER — CLOPIDOGREL BISULFATE 300 MG PO TABS
ORAL_TABLET | ORAL | Status: AC
  Filled 2019-06-15: qty 1

## 2019-06-15 MED ORDER — PREGABALIN 50 MG PO CAPS: 50.0000 mg | ORAL_CAPSULE | Freq: Two times a day (BID) | ORAL | Status: DC

## 2019-06-15 MED ORDER — ATORVASTATIN CALCIUM 80 MG PO TABS
80.0000 mg | ORAL_TABLET | Freq: Every day | ORAL | Status: DC
  Administered 2019-06-15: 80 mg via ORAL
  Filled 2019-06-15: qty 1

## 2019-06-15 MED ORDER — HYDRALAZINE HCL 20 MG/ML IJ SOLN: 5.0000 mg | INTRAMUSCULAR | Status: DC | PRN

## 2019-06-15 MED ORDER — ATORVASTATIN CALCIUM 80 MG PO TABS: 80.0000 mg | ORAL_TABLET | Freq: Every day | ORAL | Status: DC

## 2019-06-15 MED ORDER — CLOPIDOGREL BISULFATE 75 MG PO TABS
75.0000 mg | ORAL_TABLET | Freq: Every day | ORAL | Status: DC
  Administered 2019-06-16: 75 mg via ORAL
  Filled 2019-06-15: qty 1

## 2019-06-15 SURGICAL SUPPLY — 23 items
BALLN COYOTE OTW 2.5X60X150 (BALLOONS) ×3
BALLN IN.PACT DCB 6X120 (BALLOONS) ×3
CATH ANGIO 5F PIGTAIL 65CM (CATHETERS) ×3
CATH CROSS OVER TEMPO 5F (CATHETERS) ×3
CATH HAWKONE LX EXTENDED TIP (CATHETERS) ×3
CATH VIANCE CROSS STAND 150CM (MICROCATHETER) ×3
DEVICE CONTINUOUS FLUSH (MISCELLANEOUS) ×3
DEVICE SPIDERFX EMB PROT 6MM (WIRE) ×3
KIT ENCORE 26 ADVANTAGE (KITS) ×3
KIT PV (KITS) ×3
SHEATH HIGHFLEX ANSEL 7FR 55CM (SHEATH) ×3
SHEATH PINNACLE 5F 10CM (SHEATH) ×3
SHEATH PINNACLE 7F 10CM (SHEATH) ×3
STOPCOCK MORSE 400PSI 3WAY (MISCELLANEOUS) ×3
SYR MEDRAD MARK 7 150ML (SYRINGE) ×3
TAPE VIPERTRACK RADIOPAQ (MISCELLANEOUS) ×2
TAPE VIPERTRACK RADIOPAQUE (MISCELLANEOUS) ×1
TRANSDUCER W/STOPCOCK (MISCELLANEOUS) ×3
TRAY PV CATH (CUSTOM PROCEDURE TRAY) ×3
TUBING CIL FLEX 10 FLL-RA (TUBING) ×3
WIRE HITORQ VERSACORE ST 145CM (WIRE) ×3
WIRE ROSEN-J .035X180CM (WIRE) ×3
WIRE SPARTACORE .014X300CM (WIRE) ×3

## 2019-06-15 NOTE — Interval H&P Note (Signed)
History and Physical Interval Note:  06/15/2019 7:40 AM  Donald Palmer  has presented today for surgery, with the diagnosis of pad.  The various methods of treatment have been discussed with the patient and family. After consideration of risks, benefits and other options for treatment, the patient has consented to  Procedure(s): ABDOMINAL AORTOGRAM W/LOWER EXTREMITY (N/A) as a surgical intervention.  The patient's history has been reviewed, patient examined, no change in status, stable for surgery.  I have reviewed the patient's chart and labs.  Questions were answered to the patient's satisfaction.     Quay Burow

## 2019-06-15 NOTE — Telephone Encounter (Signed)
Due to current COVID 19 pandemic, our office is severely reducing in office visits until further notice, in order to minimize the risk to our patients and healthcare providers.   I called patient regarding his 7/2 appointment. Patient is currently in the hospital. Patient states that he is unsure about this appointment and he feels that he does not need it. I advised patient that I would cancel this appt and he can call back if needed.

## 2019-06-15 NOTE — Progress Notes (Signed)
    7 Fr. R F/A sheath was removed, and manual pressure was held for 27 min. R groin was soft and non tender. Sterile gauze was applied at the site. R and L DP were obtained with doppler before and after the sheath pull.  Bed rest started at 1145 X 6 hr. Instructions were given to patient about bed rest.  HR 66 SR BP149/81 sPO2 100% on R/A

## 2019-06-16 ENCOUNTER — Ambulatory Visit: Payer: Medicare HMO

## 2019-06-16 DIAGNOSIS — E1151 Type 2 diabetes mellitus with diabetic peripheral angiopathy without gangrene: Secondary | ICD-10-CM | POA: Diagnosis not present

## 2019-06-16 DIAGNOSIS — F17210 Nicotine dependence, cigarettes, uncomplicated: Secondary | ICD-10-CM | POA: Diagnosis not present

## 2019-06-16 DIAGNOSIS — E1169 Type 2 diabetes mellitus with other specified complication: Secondary | ICD-10-CM | POA: Diagnosis not present

## 2019-06-16 DIAGNOSIS — Z7982 Long term (current) use of aspirin: Secondary | ICD-10-CM | POA: Diagnosis not present

## 2019-06-16 DIAGNOSIS — Z794 Long term (current) use of insulin: Secondary | ICD-10-CM | POA: Diagnosis not present

## 2019-06-16 DIAGNOSIS — Z7902 Long term (current) use of antithrombotics/antiplatelets: Secondary | ICD-10-CM | POA: Diagnosis not present

## 2019-06-16 DIAGNOSIS — E785 Hyperlipidemia, unspecified: Secondary | ICD-10-CM | POA: Diagnosis not present

## 2019-06-16 DIAGNOSIS — I10 Essential (primary) hypertension: Secondary | ICD-10-CM | POA: Diagnosis not present

## 2019-06-16 DIAGNOSIS — Z79899 Other long term (current) drug therapy: Secondary | ICD-10-CM | POA: Diagnosis not present

## 2019-06-16 DIAGNOSIS — I739 Peripheral vascular disease, unspecified: Secondary | ICD-10-CM

## 2019-06-16 LAB — BASIC METABOLIC PANEL
Anion gap: 8 (ref 5–15)
BUN: 8 mg/dL (ref 6–20)
CO2: 26 mmol/L (ref 22–32)
Calcium: 9.2 mg/dL (ref 8.9–10.3)
Chloride: 103 mmol/L (ref 98–111)
Creatinine, Ser: 1.1 mg/dL (ref 0.61–1.24)
GFR calc Af Amer: >60 mL/min (ref >60)
GFR calc non Af Amer: >60 mL/min (ref >60)
Glucose, Bld: 116 mg/dL — ABNORMAL HIGH (ref 70–99)
Potassium: 3.9 mmol/L (ref 3.5–5.1)
Sodium: 137 mmol/L (ref 135–145)

## 2019-06-16 LAB — GLUCOSE, CAPILLARY: Glucose-Capillary: 110 mg/dL — ABNORMAL HIGH (ref 70–99)

## 2019-06-16 LAB — CBC
HCT: 44.5 % (ref 39.0–52.0)
Hemoglobin: 13.8 g/dL (ref 13.0–17.0)
MCH: 22.7 pg — ABNORMAL LOW (ref 26.0–34.0)
MCHC: 31 g/dL (ref 30.0–36.0)
MCV: 73.2 fL — ABNORMAL LOW (ref 80.0–100.0)
Platelets: 280 10*3/uL (ref 150–400)
RBC: 6.08 MIL/uL — ABNORMAL HIGH (ref 4.22–5.81)
RDW: 14.9 % (ref 11.5–15.5)
WBC: 7.2 10*3/uL (ref 4.0–10.5)
nRBC: 0 % (ref 0.0–0.2)

## 2019-06-16 MED ORDER — CLOPIDOGREL BISULFATE 75 MG PO TABS: 75.0000 mg | ORAL_TABLET | Freq: Every day | ORAL | 1 refills | Status: DC

## 2019-06-16 MED FILL — CLOPIDOGREL 75 MG TABLET: 75 | 90 days supply | Qty: 90 | Fill #0

## 2019-06-16 NOTE — Discharge Summary (Addendum)
Discharge Summary    [name],  MRN: [id number], DOB/AGE: January 27, 1961 58 y.o.  Admit date: 06/15/2019 Discharge date: 06/16/2019  Primary Care Provider: Larey Dresser A Primary Cardiologist: Dr. Gwenlyn Found   Discharge Diagnoses    Active Problems:   PAD (peripheral artery disease) ([place])   Claudication in peripheral vascular disease (HCC)   Allergies Allergies  Allergen Reactions  . Metformin And Related Other (See Comments)    GI side effects. Stopped 2016    Diagnostic Studies/Procedures    PV angiogram: 06/15/19  Angiographic Data:   1: Abdominal aorta- mildly atherosclerotic 2: Left lower extremity- there is a 40% ostial left SFA, moderately long segment mid left SFA CTO, with three-vessel runoff.  The previously atherectomized left popliteal artery was widely patent 3: Right lower extremity- 30 to 40% segmental mid left SFA with three-vessel runoff  IMPRESSION: Donald Palmer has a widely patent left popliteal artery now 15 months post atherectomy and drug-coated balloon angioplasty of a left P2 segment directional atherectomy and drug-coated balloon angioplasty.  He did have a 50% mid left SFA at that time which has progressed to a chronic total occlusion.  He is symptomatic.  We will proceed with directional arthrectomy with drug-coated balloon angioplasty.  Final Impression: Successful mid left SFA CTO directional atherectomy followed by drug-coated balloon angioplasty using spider distal protection for lifestyle limiting claudication.  The previously atherectomized and drug-coated balloon angioplasty left popliteal CTO performed March 2019 with widely patent.  The patient did have a small linear dissection in the mid left SFA at the site of drug-coated balloon angioplasty which I suspect will heal nicely.  It is unclear whether he was compliant with his antiplatelet drugs and I did give him an additional 300 mg of p.o. Plavix.  We talked about the importance  of smoking cessation.  The Ansell sheath was then withdrawn across the bifurcation and exchanged over an 035 versa core wire for a short 7 [place] sheath which was then secured in place.  The patient left lab in stable condition.  The sheath will be removed once ACT falls below 170 pressure held.  Patient will be hydrated overnight and discharged home in the morning on dual antiplatelet therapy.  We will arrange lower extremity arterial Doppler studies in our [facility] line office next week and I will see him back in the office 2 to 3 weeks thereafter.  Donald Palmer. MD, [facility] _____________   History of Present Illness     58 y.o. AAM who is followed by Dr. Gwenlyn Found as an outpatient. His left ABI was 0.66 with nuclear popliteal artery. He has a history of 40-pack-years of tobacco as well as treated hypertension, diabetes and hyperlipidemia. He has never had a heart attack or stroke. He is disabled because of his back.  Dr. Gwenlyn Found performed peripheral angiography, directional atherectomy followed by drug-coated balloon angioplasty on3/18/19 revealing occluded left popliteal artery in the P2 segment and perform directional atherectomy with drug-eluting balloon angioplasty. His follow-up left ABI improved 0.83 and his claudication did resolved.  He was recently seen in the office and he apparently did well until April when he developed recurrent claudication and numbness in his left foot.  He had continued to smoke.  Follow-up Dopplers performed 04/01/2019 revealed a decline in his left ABI to 0.72 with an occluded distal left SFA and popliteal artery.  He denied chest pain or shortness of breath. Given symptoms he was set up for outpatient PV angiogram.  Hospital Course     Underwent successful mid left SFA CTO directional atherectomy followed by drug-coated balloon angioplasty using spider distal protection with Dr. Gwenlyn Found on 06/15/19. Did have a small linear dissection of the mid left SFA at the site of  angioplasty but felt this would heal appropriately. He was continued on DAPT with ASA/plavix. No complications noted post cath. Morning labs were stable. Follow up dopplers were arranged. Smoking cessation advised.   General: Well developed, well nourished, male appearing in no acute distress. Head: Normocephalic, atraumatic.  Neck: Supple without bruits, no JVD. Lungs:  Resp regular and unlabored, CTA. Heart: RRR, S1, S2, no murmur; no rub. Abdomen: Soft, non-tender, non-distended with normoactive bowel sounds. Extremities: No clubbing, cyanosis, edema. Distal pedal pulses are 2+ bilaterally. Right femoral cath site stable without bruising or hematoma Neuro: Alert and oriented X 3. Moves all extremities spontaneously. Psych: Normal affect.  Donald Palmer was seen by Dr. Angelena Form and determined stable for discharge home. Follow up in the office has been arranged. Medications are listed below.   _____________  Discharge Vitals Blood pressure (!) 164/92, pulse 72, temperature 98 F (36.7 C), temperature source Oral, resp. rate 18, height 5' 9"  (1.753 m), weight 81.4 kg, SpO2 100 %.  Filed Weights   06/15/19 0549 06/16/19 0500  Weight: 83.9 kg 81.4 kg    Labs & Radiologic Studies    CBC Recent Labs    06/16/19 0521  WBC 7.2  HGB 13.8  HCT 44.5  MCV 73.2*  PLT 570   Basic Metabolic Panel Recent Labs    06/16/19 0521  NA 137  K 3.9  CL 103  CO2 26  GLUCOSE 116*  BUN 8  CREATININE 1.10  CALCIUM 9.2   Liver Function Tests No results for input(s): AST, ALT, ALKPHOS, BILITOT, PROT, ALBUMIN in the last 72 hours. No results for input(s): LIPASE, AMYLASE in the last 72 hours. Cardiac Enzymes No results for input(s): CKTOTAL, CKMB, CKMBINDEX, TROPONINI in the last 72 hours. BNP Invalid input(s): POCBNP D-Dimer No results for input(s): DDIMER in the last 72 hours. Hemoglobin A1C No results for input(s): HGBA1C in the last 72 hours. Fasting Lipid Panel No results for  input(s): CHOL, HDL, LDLCALC, TRIG, CHOLHDL, LDLDIRECT in the last 72 hours. Thyroid Function Tests No results for input(s): TSH, T4TOTAL, T3FREE, THYROIDAB in the last 72 hours.  Invalid input(s): FREET3 _____________  No results found. Disposition   Pt is being discharged home today in good condition.  Follow-up Plans & Appointments    Follow-up Information    [facility] Follow up on 06/30/2019.   Specialty: Cardiology Why: at 10am for your follow up dopplers.  Contact information: [address] [place] [phone]       Lorretta Harp, MD Follow up on 07/08/2019.   Specialties: Cardiology, Radiology Why: at 2pm for your follow up appt.  Contact information: [address] [place] [address] [phone]          Discharge Instructions    Call MD for:  redness, tenderness, or signs of infection (pain, swelling, redness, odor or green/yellow discharge around incision site)   Complete by: As directed    Diet - low sodium heart healthy   Complete by: As directed    Discharge instructions   Complete by: As directed    Groin Site Care Refer to this sheet in the next few weeks. These instructions provide you with information on  caring for yourself after your procedure. Your caregiver may also give you more specific instructions. Your treatment has been planned according to current medical practices, but problems sometimes occur. Call your caregiver if you have any problems or questions after your procedure. HOME CARE INSTRUCTIONS You may shower 24 hours after the procedure. Remove the bandage (dressing) and gently wash the site with plain soap and water. Gently pat the site dry.  Do not apply powder or lotion to the site.  Do not sit in a bathtub, swimming pool, or whirlpool for 5 to 7 days.  No bending, squatting, or lifting anything over 10 pounds (4.5 kg) as directed by your caregiver.  Inspect  the site at least twice daily.  Do not drive home if you are discharged the same day of the procedure. Have someone else drive you.  You may drive 24 hours after the procedure unless otherwise instructed by your caregiver.  What to expect: Any bruising will usually fade within 1 to 2 weeks.  Blood that collects in the tissue (hematoma) may be painful to the touch. It should usually decrease in size and tenderness within 1 to 2 weeks.  SEEK IMMEDIATE MEDICAL CARE IF: You have unusual pain at the groin site or down the affected leg.  You have redness, warmth, swelling, or pain at the groin site.  You have drainage (other than a small amount of blood on the dressing).  You have chills.  You have a fever or persistent symptoms for more than 72 hours.  You have a fever and your symptoms suddenly get worse.  Your leg becomes pale, cool, tingly, or numb.  You have heavy bleeding from the site. Hold pressure on the site. Marland Kitchen  PLEASE DO NOT MISS ANY DOSES OF YOUR PLAVIX!!!!! Also keep a log of you blood pressures and bring back to your follow up appt. Please call the office with any questions.   Patients taking blood thinners should generally stay away from medicines like ibuprofen, Advil, Motrin, naproxen, and Aleve due to risk of stomach bleeding. You may take Tylenol as directed or talk to your primary doctor about alternatives.   Increase activity slowly   Complete by: As directed        Discharge Medications     Medication List    TAKE these medications   Accu-Chek Aviva Plus test strip Generic drug: glucose blood check blood sugar one time A DAY as instructed (max check TWO)   Accu-Chek Aviva Plus w/Device Kit 1 each by Does not apply route 2 (two) times daily. To check blood sugar twice daily. diag code E11.40. Non insulin dependent. Please check with pt that this is the equipment he needs.   aspirin EC 81 MG tablet Take 1 tablet (81 mg total) by mouth daily.   atorvastatin 80  MG tablet Commonly known as: LIPITOR TAKE 1 TABLET BY MOUTH EVERY DAY AT 6PM What changed:   how much to take  how to take this  when to take this  additional instructions   carbamazepine 400 MG 12 hr tablet Commonly known as: TEGRETOL XR Take 1 tablet (400 mg total) by mouth daily. What changed: when to take this   clopidogrel 75 MG tablet Commonly known as: PLAVIX Take 1 tablet (75 mg total) by mouth daily.   cyclobenzaprine 10 MG tablet Commonly known as: FLEXERIL Take 10 mg by mouth at bedtime as needed for muscle spasms.   fluticasone 50 MCG/ACT nasal spray Commonly known as:  FLONASE Place 2 sprays into both nostrils daily. 120/4=30 What changed:   how much to take  how to take this  when to take this  reasons to take this  additional instructions   Lancet Devices Misc 1 each by Does not apply route 2 (two) times daily. Please use to check blood sugar 2 times daily. diag code E11.40. noninsulin dependent   liraglutide 18 MG/3ML Sopn Commonly known as: Victoza Inject 0.2 mLs (1.2 mg total) into the skin daily.   lisinopril 20 MG tablet Commonly known as: ZESTRIL Take 1 tablet (20 mg total) by mouth daily.   pantoprazole 40 MG tablet Commonly known as: PROTONIX Take 40 mg by mouth daily.   Pen Needles 32G X 4 MM Misc 1 each by Does not apply route daily. Use to injected liraglutide once a day   pregabalin 100 MG capsule Commonly known as: LYRICA Take 100 mg by mouth 2 (two) times a day. What changed: Another medication with the same name was removed. Continue taking this medication, and follow the directions you see here.        Acute coronary syndrome (MI, NSTEMI, STEMI, etc) this admission?: No.     Outstanding Labs/Studies   Follow up dopplers.   Duration of Discharge Encounter   Greater than 30 minutes including physician time.  Signed, Reino Bellis NP-C 06/16/2019, 9:05 AM   I have personally seen and examined this patient. I  agree with the assessment and plan as outlined above.  He is s/p PTA of the left SFA per Dr. Gwenlyn Found on 06/15/19. He is doing well this am. No pain in his leg. BP stable No events overnight. Right groin without hematoma. Will d/c home today and plan f/u per PV clinic recs. Continue ASA, Plavix and statin.   Lauree Chandler 06/16/2019 9:08 AM

## 2019-06-18 ENCOUNTER — Ambulatory Visit: Payer: Self-pay | Admitting: Neurology

## 2019-06-29 ENCOUNTER — Encounter: Payer: Self-pay | Admitting: *Deleted

## 2019-06-30 ENCOUNTER — Ambulatory Visit (HOSPITAL_COMMUNITY)
Admission: RE | Admit: 2019-06-30 | Discharge: 2019-06-30 | Disposition: A | Discharge status: Home / Self Care | Payer: Medicare HMO | Source: Ambulatory Visit | Attending: Cardiology | Admitting: Cardiology

## 2019-06-30 ENCOUNTER — Other Ambulatory Visit (HOSPITAL_COMMUNITY): Payer: Self-pay | Admitting: Cardiovascular Disease

## 2019-06-30 ENCOUNTER — Other Ambulatory Visit: Payer: Self-pay

## 2019-06-30 DIAGNOSIS — Z9862 Peripheral vascular angioplasty status: Secondary | ICD-10-CM

## 2019-06-30 DIAGNOSIS — I739 Peripheral vascular disease, unspecified: Secondary | ICD-10-CM | POA: Diagnosis not present

## 2019-07-08 ENCOUNTER — Other Ambulatory Visit: Payer: Self-pay

## 2019-07-08 ENCOUNTER — Ambulatory Visit (INDEPENDENT_AMBULATORY_CARE_PROVIDER_SITE_OTHER): Payer: Medicare HMO | Admitting: Cardiovascular Disease

## 2019-07-08 ENCOUNTER — Telehealth: Payer: Self-pay | Admitting: Cardiovascular Disease

## 2019-07-08 ENCOUNTER — Encounter: Payer: Self-pay | Admitting: Cardiovascular Disease

## 2019-07-08 ENCOUNTER — Encounter (INDEPENDENT_AMBULATORY_CARE_PROVIDER_SITE_OTHER): Payer: Self-pay

## 2019-07-08 VITALS — BP 134/86 | HR 107 | Temp 98.1°F | Ht 69.0 in | Wt 176.0 lb

## 2019-07-08 DIAGNOSIS — Z8679 Personal history of other diseases of the circulatory system: Secondary | ICD-10-CM

## 2019-07-08 DIAGNOSIS — I739 Peripheral vascular disease, unspecified: Secondary | ICD-10-CM | POA: Diagnosis not present

## 2019-07-08 NOTE — Assessment & Plan Note (Signed)
History of PAD status post left SFA CTO directional atherectomy and drug-coated balloon angioplasty by myself 06/15/2019.  He had excellent angiographic and clinical result.  His follow-up Doppler studies performed 7/14 7/20 showed an improved/normalized left ABI with a widely patent left SFA.  Aspirin and Plavix.  2+ pedal pulses on exam.  LEAs in 6 months followed by return office visit.

## 2019-07-08 NOTE — Patient Instructions (Signed)
Medication Instructions:  Your physician recommends that you continue on your current medications as directed. Please refer to the Current Medication list given to you today.  If you need a refill on your cardiac medications before your next appointment, please call your pharmacy.   Lab work: NONE If you have labs (blood work) drawn today and your tests are completely normal, you will receive your results only by: Marland Kitchen MyChart Message (if you have MyChart) OR . A paper copy in the mail If you have any lab test that is abnormal or we need to change your treatment, we will call you to review the results.  Testing/Procedures: Your physician has requested that you have a lower or upper extremity arterial duplex. This test is an ultrasound of the arteries in the legs or arms. It looks at arterial blood flow in the legs and arms. Allow one hour for Lower and Upper Arterial scans. There are no restrictions or special instructions TO BE SCHEDULED FOR 6 MONTHS  Your physician has requested that you have an ankle brachial index (ABI). During this test an ultrasound and blood pressure cuff are used to evaluate the arteries that supply the arms and legs with blood. Allow thirty minutes for this exam. There are no restrictions or special instructions. TO BE SCHEDULED FOR 6 MONTHS   Follow-Up: At [facility], you and your health needs are our priority.  As part of our continuing mission to provide you with exceptional heart care, we have created designated Provider Care Teams.  These Care Teams include your primary Cardiologist (physician) and Advanced Practice Providers (APPs -  Physician Assistants and Nurse Practitioners) who all work together to provide you with the care you need, when you need it. You will need a follow up appointment in 6 months WITH DR. Gwenlyn Found.  Please call our office 2 months in advance to schedule this appointment. PLEASE HAVE YOUR ULTRASOUNDS COMPLETED BEFORE YOUR NEXT APPOINTMENT  WITH DR. Gwenlyn Found

## 2019-07-08 NOTE — Telephone Encounter (Signed)

## 2019-07-08 NOTE — Progress Notes (Signed)
07/08/2019 EDU ON   [birth year]  [id number]  Primary Physician [name], MD Primary Cardiologist: Lorretta Harp MD Lupe Carney, [place]  HPI:  Donald Palmer is a 58 y.o.  single African-American male father of 78, grandfather of 14 grandchildren referred by Dr. Lynnae January for cardiovascular vascular evaluation because of symptomatic left calf claudication.I last saw him in the office  06/03/2019. His left ABI was 0.66 with nuclear popliteal artery.He has a history of 40-pack-years of tobacco as well as treated hypertension, diabetes and hyperlipidemia. He has never had a heart attack or stroke. He denies chest pain or shortness of breath. He is disabled because of his back. He is complaining of left calf claudication with segmental pressures to suggest PAD.  I then performed peripheral angiography, directional atherectomy followed by drug-coated balloon angioplasty on3/18/19 revealing occluded left popliteal artery in the P2 segment and perform directional atherectomy with drug-eluting balloon angioplasty. His follow-up left ABI improved 0.83 and his claudication has completely resolved.  Since I saw him in the office over a year ago he apparently did well until past April when he developed recurrent claudication and numbness in his left foot.  He has continued to smoke.  Follow-up Dopplers performed 04/01/2019 revealed a decline in his left ABI to 0.72 with an occluded distal left SFA and popliteal artery.    I performed peripheral angiography and intervention on him 06/15/2019 demonstrating a mid left SFA CTO with three-vessel runoff.  I performed [facility] 1 directional atherectomy followed by drug-coated balloon angioplasty with excellent angiographic result.  His Dopplers normalized his claudication resolved.    Current Meds  Medication Sig  . ACCU-CHEK AVIVA PLUS test strip check blood sugar one time A DAY as instructed (max check TWO)  . aspirin EC 81 MG tablet Take 1  tablet (81 mg total) by mouth daily.  Marland Kitchen atorvastatin (LIPITOR) 80 MG tablet TAKE 1 TABLET BY MOUTH EVERY DAY AT 6PM (Patient taking differently: Take 80 mg by mouth daily at 6 PM. )  . Blood Glucose Monitoring Suppl (ACCU-CHEK AVIVA PLUS) W/DEVICE KIT 1 each by Does not apply route 2 (two) times daily. To check blood sugar twice daily. diag code E11.40. Non insulin dependent. Please check with pt that this is the equipment he needs.  . carbamazepine (TEGRETOL XR) 400 MG 12 hr tablet Take 1 tablet (400 mg total) by mouth daily. (Patient taking differently: Take 400 mg by mouth every evening. )  . clopidogrel (PLAVIX) 75 MG tablet Take 1 tablet (75 mg total) by mouth daily.  . cyclobenzaprine (FLEXERIL) 10 MG tablet Take 10 mg by mouth at bedtime as needed for muscle spasms.  . fluticasone (FLONASE) 50 MCG/ACT nasal spray Place 2 sprays into both nostrils daily. 120/4=30 (Patient taking differently: Place 2 sprays into both nostrils daily as needed for allergies. )  . Insulin Pen Needle (PEN NEEDLES) 32G X 4 MM MISC 1 each by Does not apply route daily. Use to injected liraglutide once a day  . Lancet Devices MISC 1 each by Does not apply route 2 (two) times daily. Please use to check blood sugar 2 times daily. diag code E11.40. noninsulin dependent  . liraglutide (VICTOZA) 18 MG/3ML SOPN Inject 0.2 mLs (1.2 mg total) into the skin daily.  Marland Kitchen lisinopril (PRINIVIL,ZESTRIL) 20 MG tablet Take 1 tablet (20 mg total) by mouth daily.  . pantoprazole (PROTONIX) 40 MG tablet Take 40 mg by mouth daily.  . pregabalin (LYRICA)  100 MG capsule Take 100 mg by mouth 2 (two) times a day.     Allergies  Allergen Reactions  . Metformin And Related Other (See Comments)    GI side effects. Stopped 2016    Social History   Socioeconomic History  . Marital status: Single    Spouse name: Not on file  . Number of children: Not on file  . Years of education: Not on file  . Highest education level: Not on file   Occupational History  . Not on file  Social Needs  . Financial resource strain: Not on file  . Food insecurity    Worry: Not on file    Inability: Not on file  . Transportation needs    Medical: Not on file    Non-medical: Not on file  Tobacco Use  . Smoking status: Current Some Day Smoker    Packs/day: 0.10    Years: 30.00    Pack years: 3.00    Types: Cigarettes, Cigars  . Smokeless tobacco: Never Used  . Tobacco comment: 5 cigs per week  Substance and Sexual Activity  . Alcohol use: Yes    Alcohol/week: 5.0 standard drinks    Types: 5 Cans of beer per week    Comment: Beer.  . Drug use: Yes    Frequency: 1.0 times per week    Types: Marijuana    Comment: rarely  . Sexual activity: Yes  Lifestyle  . Physical activity    Days per week: Not on file    Minutes per session: Not on file  . Stress: Not on file  Relationships  . Social Herbalist on phone: Not on file    Gets together: Not on file    Attends religious service: Not on file    Active member of club or organization: Not on file    Attends meetings of clubs or organizations: Not on file    Relationship status: Not on file  . Intimate partner violence    Fear of current or ex partner: Not on file    Emotionally abused: Not on file    Physically abused: Not on file    Forced sexual activity: Not on file  Other Topics Concern  . Not on file  Social History Narrative   Prior incarceration. Tattoos, inc ones obtained in prison.    On disability since 09/2013     Review of Systems: General: negative for chills, fever, night sweats or weight changes.  Cardiovascular: negative for chest pain, dyspnea on exertion, edema, orthopnea, palpitations, paroxysmal nocturnal dyspnea or shortness of breath Dermatological: negative for rash Respiratory: negative for cough or wheezing Urologic: negative for hematuria Abdominal: negative for nausea, vomiting, diarrhea, bright red blood per rectum, melena, or  hematemesis Neurologic: negative for visual changes, syncope, or dizziness All other systems reviewed and are otherwise negative except as noted above.    Blood pressure 134/86, pulse (!) 107, temperature 98.1 F (36.7 C), height _0  (1.753 m), weight 176 lb (79.8 kg).  General appearance: alert and no distress Neck: no adenopathy, no carotid bruit, no JVD, supple, symmetrical, trachea midline and thyroid not enlarged, symmetric, no tenderness/mass/nodules Lungs: clear to auscultation bilaterally Heart: regular rate and rhythm, S1, S2 normal, no murmur, click, rub or gallop Extremities: extremities normal, atraumatic, no cyanosis or edema Pulses: 2+ and symmetric Skin: Skin color, texture, turgor normal. No rashes or lesions Neurologic: Alert and oriented X 3, normal strength and tone. Normal symmetric  reflexes. Normal coordination and gait  EKG sinus tachycardia at 107 left axis deviation/left anterior fascicular block and T wave changes.  I personally reviewed this EKG.  ASSESSMENT AND PLAN:   Claudication in peripheral vascular disease ([place]) History of PAD status post left SFA CTO directional atherectomy and drug-coated balloon angioplasty by myself 06/15/2019.  He had excellent angiographic and clinical result.  His follow-up Doppler studies performed 7/14 7/20 showed an improved/normalized left ABI with a widely patent left SFA.  Aspirin and Plavix.  2+ pedal pulses on exam.  LEAs in 6 months followed by return office visit.      Lorretta Harp MD FACP,FACC,FAHA, FSCAI 07/08/2019 1:40 PM

## 2019-07-14 ENCOUNTER — Other Ambulatory Visit: Payer: Self-pay | Admitting: Internal Medicine

## 2019-07-20 NOTE — Addendum Note (Signed)
Addended by: Hulan Fray on: 07/20/2019 03:38 PM   Modules accepted: Orders

## 2019-07-30 ENCOUNTER — Encounter: Payer: Medicare HMO | Admitting: Internal Medicine

## 2019-08-12 ENCOUNTER — Ambulatory Visit: Payer: Medicare HMO | Admitting: Cardiovascular Disease

## 2019-08-27 ENCOUNTER — Other Ambulatory Visit: Payer: Self-pay | Admitting: Internal Medicine

## 2019-08-27 MED ORDER — PANTOPRAZOLE SODIUM 40 MG PO TBEC: 40.0000 mg | DELAYED_RELEASE_TABLET | Freq: Every day | ORAL | 3 refills | Status: DC

## 2019-08-27 NOTE — Telephone Encounter (Signed)
Pt needs refill on his stomach medicine; pt contact [facility] [phone] - [place], [place] - [address] AT [place]

## 2019-10-08 ENCOUNTER — Encounter: Payer: Self-pay | Admitting: Dietician

## 2019-10-08 ENCOUNTER — Other Ambulatory Visit: Payer: Self-pay

## 2019-10-08 ENCOUNTER — Ambulatory Visit (INDEPENDENT_AMBULATORY_CARE_PROVIDER_SITE_OTHER): Payer: Medicare HMO | Admitting: Internal Medicine

## 2019-10-08 ENCOUNTER — Encounter: Payer: Self-pay | Admitting: Internal Medicine

## 2019-10-08 ENCOUNTER — Ambulatory Visit (INDEPENDENT_AMBULATORY_CARE_PROVIDER_SITE_OTHER): Payer: Medicare HMO | Admitting: Dietician

## 2019-10-08 VITALS — BP 125/80 | HR 99 | Temp 98.2°F | Ht 69.0 in | Wt 180.6 lb

## 2019-10-08 DIAGNOSIS — Z7982 Long term (current) use of aspirin: Secondary | ICD-10-CM

## 2019-10-08 DIAGNOSIS — G894 Chronic pain syndrome: Secondary | ICD-10-CM | POA: Diagnosis not present

## 2019-10-08 DIAGNOSIS — K219 Gastro-esophageal reflux disease without esophagitis: Secondary | ICD-10-CM

## 2019-10-08 DIAGNOSIS — E1140 Type 2 diabetes mellitus with diabetic neuropathy, unspecified: Secondary | ICD-10-CM

## 2019-10-08 DIAGNOSIS — E1169 Type 2 diabetes mellitus with other specified complication: Secondary | ICD-10-CM

## 2019-10-08 DIAGNOSIS — I739 Peripheral vascular disease, unspecified: Secondary | ICD-10-CM

## 2019-10-08 DIAGNOSIS — E785 Hyperlipidemia, unspecified: Secondary | ICD-10-CM | POA: Diagnosis not present

## 2019-10-08 DIAGNOSIS — Z8679 Personal history of other diseases of the circulatory system: Secondary | ICD-10-CM | POA: Diagnosis not present

## 2019-10-08 DIAGNOSIS — L818 Other specified disorders of pigmentation: Secondary | ICD-10-CM | POA: Diagnosis not present

## 2019-10-08 DIAGNOSIS — Z79899 Other long term (current) drug therapy: Secondary | ICD-10-CM

## 2019-10-08 DIAGNOSIS — Z720 Tobacco use: Secondary | ICD-10-CM | POA: Diagnosis not present

## 2019-10-08 DIAGNOSIS — E1151 Type 2 diabetes mellitus with diabetic peripheral angiopathy without gangrene: Secondary | ICD-10-CM | POA: Diagnosis not present

## 2019-10-08 DIAGNOSIS — Z7902 Long term (current) use of antithrombotics/antiplatelets: Secondary | ICD-10-CM

## 2019-10-08 DIAGNOSIS — Z794 Long term (current) use of insulin: Secondary | ICD-10-CM

## 2019-10-08 DIAGNOSIS — E1142 Type 2 diabetes mellitus with diabetic polyneuropathy: Secondary | ICD-10-CM

## 2019-10-08 DIAGNOSIS — E1159 Type 2 diabetes mellitus with other circulatory complications: Secondary | ICD-10-CM | POA: Diagnosis not present

## 2019-10-08 LAB — POCT GLYCOSYLATED HEMOGLOBIN (HGB A1C): Hemoglobin A1C: 9 % — AB (ref 4.0–5.6)

## 2019-10-08 LAB — GLUCOSE, CAPILLARY: Glucose-Capillary: 269 mg/dL — ABNORMAL HIGH (ref 70–99)

## 2019-10-08 MED ORDER — BLOOD GLUCOSE MONITOR KIT: PACK | 0 refills | Status: DC

## 2019-10-08 MED ORDER — GLUCOSE BLOOD VI STRP: ORAL_STRIP | 12 refills | Status: DC

## 2019-10-08 NOTE — Patient Instructions (Signed)
Please record the time, amount and what food drinks and activities you have while wearing the continuous glucose monitor (CGM).  Bring the folder with you to follow up appointments. If your monitor falls off, please place it in the bag provided in your folder and bring it back with you to your next appointment.   Do not have a CT or an MRI while wearing the CGM.   You will return in 1 week to see me and a doctor for the first of two CGM downloads.   You will also return in 2 weeks to have your second download and the CGM removed.  Debera Lat, RD 10/08/2019 3:04 PM

## 2019-10-08 NOTE — Assessment & Plan Note (Addendum)
This problem is chronic and worsened.  His A1c trend has been 6.9 - 9.0 today.  He is on liraglutide 1.2 daily.  He prefers his CBG today 140 as he states when it is lower that he feels bad and did not take something to increase the sugar.  He also does not take the liraglutide when his sugar is closer to 100.  I started education today that he needs to take the liraglutide every day regardless of his CBG, that liraglutide is unlikely to cause hypoglycemia, but just because he feels fine does not mean he is not at risk for diabetic complications, & that his PAD will do better with better diabetic control.  He has continued a lot of diabetic education.  Butch Penny placed a CGM today and he will return in 1 week for download #1.  I also sent him an Accu-Chek glucometer as well as the brand he prefers but he also requested supplies for his Precision XTRA meter.  PLAN : liraglutide 1.2 every day regardless of fasting CBG CGM Order diabetic testing supplies Return to clinic in 1 week for CGM download #1

## 2019-10-08 NOTE — Assessment & Plan Note (Signed)
This problem is chronic and controlled.  He is on atorvastatin 80 high intensity statin for secondary prevention.  I emphasized the need to take it every day and it lifelong.  PLAN:  Cont current meds

## 2019-10-08 NOTE — Progress Notes (Signed)
   Subjective:    [name], male    DOB: Jan 19, 1961, 58 y.o.   MRN: [id number]  HPI  Donald Palmer is here for DM F/U. Please see the A&P for the status of the pt's chronic medical problems.  ROS : per ROS section and in problem oriented charting. All other systems are negative.  PMHx, Soc hx, and / or Fam hx : working at [employer] on [employer]. Sig other smokes. He is at 3-4 cig per month.  Review of Systems  Musculoskeletal: Negative for gait problem.       Calf tenderness after long shift.  Denies neuropathy Endorses intermittent GERD symptoms Back pain and foot pain and thoracic pain follow-up results     Objective:   Physical Exam Constitutional:      Appearance: Normal appearance. He is normal weight.  HENT:     Head: Normocephalic and atraumatic.     Nose: Nose normal.  Eyes:     Extraocular Movements: Extraocular movements intact.     Conjunctiva/sclera: Conjunctivae normal.  Pulmonary:     Effort: Pulmonary effort is normal. No respiratory distress.  Skin:    General: Skin is dry.     Comments: + tattoos  Neurological:     Mental Status: He is alert.  Psychiatric:        Attention and Perception: He is inattentive.        Mood and Affect: Mood normal.        Speech: Speech is rapid and pressured.        Behavior: Behavior normal. Behavior is cooperative.        Thought Content: Thought content normal.        Judgment: Judgment is impulsive.     Comments: Low healthcare literacy which can be perceived as impaired memory       Assessment & Plan:

## 2019-10-08 NOTE — Assessment & Plan Note (Signed)
This problem is chronic and improved.  He denies any neuropathic pain so I attempted to destroy his pregabalin since he has not been taking it but he wanted to hang onto it just in case.

## 2019-10-08 NOTE — Patient Instructions (Addendum)
Please come back in 1 week for first CGM download  I sent diabetes supplies to [facility]

## 2019-10-08 NOTE — Progress Notes (Signed)
Documentation for Freestyle Libre Pro Continuous glucose monitoring Freestyle Libre Pro CGM sensor placed today. Patient was educated about wearing sensor, keeping food, activity and medication log and when to call office. Patient was educated about how to care for the sensor and not to have an MRI, CT or Diathermy while wearing the sensor. Follow up was arranged with the patient for 1 week.  Lab Results  Component Value Date   HGBA1C 9.0 (A) 10/08/2019   HGBA1C 6.9 (A) 11/27/2018   HGBA1C 8.9 (A) 07/31/2018   HGBA1C 8.6 04/10/2018   HGBA1C 7.9 12/23/2017    Lot #: [id number] Serial #: [id number] Expiration Date: 01/19/2020  Debera Lat, RD 10/08/2019 3:02 PM.

## 2019-10-08 NOTE — Assessment & Plan Note (Addendum)
This problem is chronic and well controlled.  He a bottle of lisinopril 20 mg but does not take it consistently. I am more concerned about intermitted use than uncontrolled BP now. Will stop & follow. I called pharmacy to cancel all extraneous meds and refills.  PLAN : stop lisinopril Follow BP  BP Readings from Last 3 Encounters:  10/08/19 125/80  07/08/19 134/86  06/16/19 (!) 164/92

## 2019-10-08 NOTE — Assessment & Plan Note (Addendum)
This problem is chronic and controlled.  He had 2 bottles of Plavix and 1 bottle of atorvastatin.  He could not definitively say whether he was taking 1 Plavix from each bottle or not.  He combined the 2 bottles of Plavix until 1 and I threw away the extra bottle.  I emphasized repeatedly that they work in different methods and that he did in fact need to take both the antiplatelet and the statin.  He told me he does have a bottle of aspirin at home but then asked me what kind of aspirin he needed to take.  I recommended a baby 81 mg aspirin a day.  He states he is only smoking 3 cigarettes a month.  He understands he needs to quit.  He is very happy with the results from Dr. Naida Sleight interventions as he is no longer having his leg or back pain.  He is going to need continuous education doing for medication adherence and tobacco avoidance.  PLAN : ASA 81 QD Plavix 75 daily Atorvastatin 80 daily Tobacco avoidance Follow-up Dr. Gwenlyn Found in January

## 2019-10-08 NOTE — Assessment & Plan Note (Signed)
This problem is chronic and improved.  The only pain he complained of was some calf tightness or discomfort after working.  He is using the Flexeril as needed to treat this.  He denies foot neuropathic pain and I tried to remove the pregabalin but he wanted to keep it.  He states his back pain and leg pain and thoracic pain have all resolved.  PLAN : Flexeril as needed Reassess need for pregabalin at his next appointment

## 2019-10-08 NOTE — Assessment & Plan Note (Signed)
This problem is chronic and stable.  At his last appointment, he had stated he was not having symptoms and not taking a PPI.  This appointment, he states he is taking it.  I left his Protonix on his medication list.  PLAN:  Cont current meds

## 2019-10-15 ENCOUNTER — Ambulatory Visit: Payer: Medicare HMO

## 2019-10-15 ENCOUNTER — Ambulatory Visit (INDEPENDENT_AMBULATORY_CARE_PROVIDER_SITE_OTHER): Payer: Medicare HMO | Admitting: Dietician

## 2019-10-15 DIAGNOSIS — E1140 Type 2 diabetes mellitus with diabetic neuropathy, unspecified: Secondary | ICD-10-CM | POA: Diagnosis not present

## 2019-10-15 DIAGNOSIS — Z713 Dietary counseling and surveillance: Secondary | ICD-10-CM

## 2019-10-15 NOTE — Patient Instructions (Signed)
Mr. Bernardy-   today we talked about  Your Continuous glucose monitoring results.   The plan we made is for you to drink more water or no sugar added drinks in the evening to see if this will lower your blood sugars between 10 Pm and 3 AM AND your A1C in 3 months  Please make a follow up with me in about 3 weeks.   Please bring your meter so we can follow up on how your blood sugars are doing with the changes above.   Please call with questions or concerns.  Sincerely,   Butch Penny [phone]

## 2019-10-15 NOTE — Progress Notes (Signed)
  Medical Nutrition Therapy:  Appt start time: 1120 end time:  1135. Total time: 15 Visit # 1  10/15/2019 Donald Palmer [id number]  Assessment:  Primary concerns today: [name] presents late for CGM download #1. We had limited time today to explore his diabetes self care in detail. His sensor fell off yesterday. He says it was bothering him. It was downloaded and reviewed with him He did not want it replaced. It showed his blood sugar mostly in target, no low blood sugars and 33% high and 6% very high. We discussed his blood sugar goals which he states are around 140 mg/dl. He states at his work he is around food and this makes controlling his blood sugars difficult. He saw the rise in blood sugar in the late evening between 9-11 PM that stayed elevated most days until~ 3 AM on the download and stated he is at home most of this time. He stated that he will consider trying to drink less milk and sweet tea during this time to try to decrease the rise above goals.  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   Nutritional Diagnosis:  NB-1.4 Self-monitoring deficit As related to him not knowing what happens to blood sugar after eating.  As evidenced by his report and understanding verbalized about what he may need to work on changing. .    Intervention:  Nutrition education about sources of carbohydrates, understanding CGM results. Action Goal: drink more water when at home, drink smaller  portions of milk and sweet tea  Outcome goal: lower blood sugars in the PM and possible A1C Coordination of care: none  Teaching Method Utilized: Visual, Auditory,Hands on Handouts given during visit include:CGM results Barriers to learning/adherence to lifestyle change: competing values Demonstrated degree of understanding via:  Teach Back   Monitoring/Evaluation:  Dietary intake, exercise, meter, and body weight in 3 week(s). Debera Lat, RD 10/15/2019 1:42 PM.

## 2019-10-28 ENCOUNTER — Other Ambulatory Visit: Payer: Self-pay

## 2019-10-28 ENCOUNTER — Other Ambulatory Visit: Payer: Self-pay | Admitting: Internal Medicine

## 2019-10-28 MED ORDER — PEN NEEDLES 32G X 4 MM MISC: 1.0000 | Freq: Every day | 3 refills | Status: DC

## 2019-10-28 NOTE — Telephone Encounter (Signed)
Insulin Pen Needle (PEN NEEDLES) 32G X 4 MM MISC   Refill request @  [facility] [id number] - [place], [place] - [address] AT [place] [phone] (Phone) [phone] (Fax)   Pt would like this be filled by today.

## 2019-10-28 NOTE — Telephone Encounter (Signed)
RTC TO PT, ASK HIM TO CALL HIS PHARM, script has been sent

## 2019-10-28 NOTE — Telephone Encounter (Signed)
Pt calling back in reference to his Refill Request for his Pen Needles. Please call pt back.

## 2019-11-03 ENCOUNTER — Other Ambulatory Visit: Payer: Self-pay | Admitting: Internal Medicine

## 2019-11-25 DIAGNOSIS — H5213 Myopia, bilateral: Secondary | ICD-10-CM | POA: Diagnosis not present

## 2019-11-25 DIAGNOSIS — H524 Presbyopia: Secondary | ICD-10-CM | POA: Diagnosis not present

## 2019-11-25 DIAGNOSIS — H52209 Unspecified astigmatism, unspecified eye: Secondary | ICD-10-CM | POA: Diagnosis not present

## 2019-11-26 ENCOUNTER — Other Ambulatory Visit: Payer: Self-pay | Admitting: Internal Medicine

## 2019-11-30 ENCOUNTER — Other Ambulatory Visit: Payer: Self-pay | Admitting: Internal Medicine

## 2019-12-21 ENCOUNTER — Other Ambulatory Visit: Payer: Self-pay | Admitting: Internal Medicine

## 2019-12-21 NOTE — Telephone Encounter (Signed)
Needs refill on   pregabalin (LYRICA) 100 MG capsule     ;pt contact [facility] [id number] - [place], [place] - [address] AT [place]

## 2019-12-23 NOTE — Telephone Encounter (Signed)
Needs PCP appt end of January DM FU

## 2019-12-24 NOTE — Telephone Encounter (Signed)
Patient has been sch of his Appt on 01/14/2020 @ 8:15 am.

## 2020-01-04 ENCOUNTER — Other Ambulatory Visit: Payer: Self-pay | Admitting: Internal Medicine

## 2020-01-07 ENCOUNTER — Ambulatory Visit (HOSPITAL_COMMUNITY)
Admission: RE | Admit: 2020-01-07 | Payer: Medicare HMO | Source: Ambulatory Visit | Attending: Cardiovascular Disease | Admitting: Cardiovascular Disease

## 2020-01-07 ENCOUNTER — Encounter (HOSPITAL_COMMUNITY): Payer: Medicare HMO

## 2020-01-10 ENCOUNTER — Other Ambulatory Visit: Payer: Self-pay | Admitting: Internal Medicine

## 2020-01-12 ENCOUNTER — Telehealth: Payer: Self-pay | Admitting: Internal Medicine

## 2020-01-12 NOTE — Telephone Encounter (Signed)
Needs refill on muscle reflexer; pt contact [facility], [place] - [address] AT [place]

## 2020-01-12 NOTE — Telephone Encounter (Signed)
Called pt and informed him med is at [facility], he was agreeable

## 2020-01-14 ENCOUNTER — Encounter: Payer: Medicare HMO | Admitting: Internal Medicine

## 2020-01-18 ENCOUNTER — Other Ambulatory Visit (HOSPITAL_COMMUNITY): Payer: Self-pay | Admitting: Cardiovascular Disease

## 2020-01-18 ENCOUNTER — Ambulatory Visit (HOSPITAL_COMMUNITY)
Admission: RE | Admit: 2020-01-18 | Discharge: 2020-01-18 | Disposition: A | Discharge status: Home / Self Care | Payer: Medicare HMO | Source: Ambulatory Visit | Attending: Cardiology | Admitting: Cardiology

## 2020-01-18 ENCOUNTER — Other Ambulatory Visit: Payer: Self-pay

## 2020-01-18 DIAGNOSIS — I739 Peripheral vascular disease, unspecified: Secondary | ICD-10-CM

## 2020-01-18 DIAGNOSIS — Z9862 Peripheral vascular angioplasty status: Secondary | ICD-10-CM

## 2020-01-19 DIAGNOSIS — I739 Peripheral vascular disease, unspecified: Secondary | ICD-10-CM

## 2020-01-28 ENCOUNTER — Ambulatory Visit (INDEPENDENT_AMBULATORY_CARE_PROVIDER_SITE_OTHER): Payer: Medicare HMO | Admitting: Internal Medicine

## 2020-01-28 ENCOUNTER — Other Ambulatory Visit: Payer: Self-pay

## 2020-01-28 ENCOUNTER — Encounter: Payer: Self-pay | Admitting: Internal Medicine

## 2020-01-28 VITALS — BP 127/76 | HR 90 | Wt 183.1 lb

## 2020-01-28 DIAGNOSIS — Z794 Long term (current) use of insulin: Secondary | ICD-10-CM | POA: Diagnosis not present

## 2020-01-28 DIAGNOSIS — Z79899 Other long term (current) drug therapy: Secondary | ICD-10-CM | POA: Diagnosis not present

## 2020-01-28 DIAGNOSIS — G894 Chronic pain syndrome: Secondary | ICD-10-CM | POA: Diagnosis not present

## 2020-01-28 DIAGNOSIS — E1140 Type 2 diabetes mellitus with diabetic neuropathy, unspecified: Secondary | ICD-10-CM | POA: Diagnosis not present

## 2020-01-28 LAB — POCT GLYCOSYLATED HEMOGLOBIN (HGB A1C): Hemoglobin A1C: 9.8 % — AB (ref 4.0–5.6)

## 2020-01-28 LAB — GLUCOSE, CAPILLARY: Glucose-Capillary: 261 mg/dL — ABNORMAL HIGH (ref 70–99)

## 2020-01-28 MED ORDER — PREGABALIN 100 MG PO CAPS: ORAL_CAPSULE | ORAL | 2 refills | Status: DC

## 2020-01-28 NOTE — Progress Notes (Unsigned)
   Subjective:    [name], male    DOB: February 17, 1961, 59 y.o.   MRN: [id number]  HPI  RUBE BOSWORTH is here for diabetic neuropathy. Please see the A&P for the status of the pt's chronic medical problems.  ROS : per ROS section and in problem oriented charting. All other systems are negative.  PMHx, Soc hx, and / or Fam hx : working at [employer] from 2 per week to 4 or 5.   Review of Systems  Gastrointestinal:       Foamy throw up when takes the pregabalin as a capsule  Neurological:       + distal B neuropathy to lower calves  Psychiatric/Behavioral: Positive for sleep disturbance.       Objective:   Physical Exam Vitals and nursing note reviewed.  Constitutional:      General: He is not in acute distress.    Appearance: Normal appearance. He is normal weight. He is not ill-appearing, toxic-appearing or diaphoretic.  Neurological:     General: No focal deficit present.     Mental Status: He is alert. Mental status is at baseline.  Psychiatric:        Mood and Affect: Mood normal.        Thought Content: Thought content normal.        Judgment: Judgment normal.     Comments: Pressured speech            Assessment & Plan:

## 2020-01-28 NOTE — Addendum Note (Signed)
Addended by: Marcelino Duster on: 01/28/2020 04:32 PM   Modules accepted: Orders

## 2020-01-28 NOTE — Assessment & Plan Note (Signed)
This problem is chronic and uncontrolled.  He states that his work hours have increased to almost 40 hours a week.  That means he is on his feet for those hours.  He is having throbbing ache from his toes up to the inferior margin of his calf muscle.  He has no pain superior to the calf.  He has been taking the pregabalin 200 mg at bedtime and states it helps his pain, helps his sleep, and also helps his back.  He wants to increase the dose.  He states that he has been running apart the capsules and taking the medicine inside as is because otherwise, he gets foaminess in his mouth.  I encouraged him not to do this.  ASSESSMENT : Diabetic neuropathy secondary to uncontrolled diabetes and increased work hours  PLAN: Increase pregabalin to 100 mg in the morning and 200 mg at night.

## 2020-01-28 NOTE — Patient Instructions (Signed)
Take pregabalin (the red pill) one pill in the morning and two at night. Do not open the capsules.  Root beer has a lot of sugar. Water is the best drink and will not increase your sugar.

## 2020-01-28 NOTE — Progress Notes (Signed)
   Subjective:    [name], male    DOB: 1961-10-19, 59 y.o.   MRN: [id number]  HPI  ACHILLES ROZMUS is here for diabetic neuropathy. Please see the A&P for the status of the pt's chronic medical problems.  ROS : per ROS section and in problem oriented charting. All other systems are negative.  PMHx, Soc hx, and / or Fam hx : working at [employer] from 2 per week to 4 or 5.   Review of Systems  Gastrointestinal:       Foamy throw up when takes the pregabalin as a capsule  Neurological:       + distal B neuropathy to lower calves  Psychiatric/Behavioral: Positive for sleep disturbance    Objective:   Physical Exam   Physical Exam Vitals and nursing note reviewed.  Constitutional:      General: He is not in acute distress.    Appearance: Normal appearance. He is normal weight. He is not ill-appearing, toxic-appearing or diaphoretic.  Neurological:     General: No focal deficit present.     Mental Status: He is alert. Mental status is at baseline.  Psychiatric:        Mood and Affect: Mood normal.        Thought Content: Thought content normal.        Judgment: Judgment normal.     Comments: Pressured speech       Assessment & Plan:

## 2020-01-28 NOTE — Assessment & Plan Note (Signed)
This problem is chronic and uncontrolled.  His A1c trend has been 6.9 - 9.0 - 9.6 today.  He is on liraglutide 1.2.  He had worn the CGM for 1 week and 33% of his values were high, primarily between 10 PM and 3 AM.  Butch Penny, our diabetes educator, worked with him and he was going to cut out his sweet tea and milk during those times to decrease his hyperglycemia.  He states he has cut that out but substituted root beer, and not the sugar-free kind.  I encouraged him to stop the root beer and focus on water.  I will collaborate with Butch Penny to formulate a management plan.  I am not certain how well he will respond to dietary manipulation even though that would be first-line therapy.  I would likely need to start a second agent.  The combination of degludec insulin and liraglutide might be a great option.  PLAN : Collaborate with Butch Penny to formulate new treatment plan

## 2020-01-29 LAB — MICROALBUMIN / CREATININE URINE RATIO
Creatinine, Urine: 166.1 mg/dL
Microalb/Creat Ratio: 6 mg/g creat (ref 0–29)
Microalbumin, Urine: 9.6 ug/mL

## 2020-02-02 ENCOUNTER — Other Ambulatory Visit: Payer: Self-pay | Admitting: Internal Medicine

## 2020-02-02 DIAGNOSIS — E1140 Type 2 diabetes mellitus with diabetic neuropathy, unspecified: Secondary | ICD-10-CM

## 2020-02-05 ENCOUNTER — Other Ambulatory Visit: Payer: Self-pay | Admitting: Internal Medicine

## 2020-02-05 DIAGNOSIS — E1140 Type 2 diabetes mellitus with diabetic neuropathy, unspecified: Secondary | ICD-10-CM

## 2020-02-12 ENCOUNTER — Other Ambulatory Visit: Payer: Self-pay | Admitting: Internal Medicine

## 2020-02-15 ENCOUNTER — Other Ambulatory Visit: Payer: Self-pay | Admitting: *Deleted

## 2020-02-16 MED ORDER — ATORVASTATIN CALCIUM 80 MG PO TABS: 80.0000 mg | ORAL_TABLET | Freq: Every day | ORAL | 3 refills | Status: DC

## 2020-02-16 NOTE — Addendum Note (Signed)
Addended by: Larey Dresser A on: 02/16/2020 08:45 AM   Modules accepted: Orders

## 2020-02-18 ENCOUNTER — Other Ambulatory Visit: Payer: Self-pay | Admitting: Internal Medicine

## 2020-02-18 DIAGNOSIS — J0101 Acute recurrent maxillary sinusitis: Secondary | ICD-10-CM

## 2020-02-23 ENCOUNTER — Telehealth: Payer: Self-pay | Admitting: Dietician

## 2020-02-23 NOTE — Telephone Encounter (Signed)
Called Donald Palmer per Dr. Zenovia Jarred request to discuss adding back pioglitazone to help lower his blood sugar/a1c. - He reports his blood sugars are better now because he stopped eating fried food from the [employer] where he works.  He states it was 120 yesterday. I asked for more blood sugars and for him to check while we were on the phone explaining that 1 blood sugar is not enough information to evaluate his overall diabetes care. He again reported that it was 120. He is not interested in wearing a Continuous glucose monitor.   He agrees to restarting pioglitazone, continue to check at least daily and a phone call in 1 week to review more blood sugars. request prescription for pioglitazone be sent to his pharmacy per his request. He has a follow up with Dr. Lynnae January in May.

## 2020-02-24 ENCOUNTER — Encounter: Payer: Self-pay | Admitting: *Deleted

## 2020-02-24 ENCOUNTER — Ambulatory Visit: Payer: Self-pay | Admitting: *Deleted

## 2020-02-24 ENCOUNTER — Other Ambulatory Visit: Payer: Self-pay | Admitting: Internal Medicine

## 2020-02-24 DIAGNOSIS — I739 Peripheral vascular disease, unspecified: Secondary | ICD-10-CM

## 2020-02-24 DIAGNOSIS — E1140 Type 2 diabetes mellitus with diabetic neuropathy, unspecified: Secondary | ICD-10-CM

## 2020-02-24 MED ORDER — PIOGLITAZONE HCL 30 MG PO TABS: 30.0000 mg | ORAL_TABLET | Freq: Every day | ORAL | 1 refills | Status: DC

## 2020-02-24 NOTE — Chronic Care Management (AMB) (Addendum)
  Chronic Care Management   Note  02/24/2020 Name: Donald Palmer MRN: [id number] DOB: 04/23/1961  In response to referral received from primary care provider for chronic disease management assistance , attempted to reach patient via cell phone to discuss Chronic Care management program. No answer, HIPAA compliant voice mail left requesting return call.    Follow up plan:  If no return call from patient, the care management team will reach out to the patient again over the next 7 days.   Kelli Churn RN, CCM, [facility] RN Care Manager [phone]

## 2020-02-24 NOTE — Telephone Encounter (Signed)
Thank you Butch Penny! I am starting at 30 mg.

## 2020-03-03 ENCOUNTER — Ambulatory Visit: Payer: Self-pay | Admitting: *Deleted

## 2020-03-03 DIAGNOSIS — E1140 Type 2 diabetes mellitus with diabetic neuropathy, unspecified: Secondary | ICD-10-CM

## 2020-03-03 DIAGNOSIS — Z8679 Personal history of other diseases of the circulatory system: Secondary | ICD-10-CM

## 2020-03-03 NOTE — Chronic Care Management (AMB) (Signed)
  Care Management   Note  03/03/2020 Name: Donald Palmer MRN: [id number] DOB: 1961-09-17     In response to referral received from primary care provider for chronic disease management assistance , second attempt to reach patient via cell phone to discuss Chronic Care management program. No answer, HIPAA compliant voice mail left requesting return call.    Follow up plan:  If no return call from patient, the care management team will reach out to the patient again over the next 7-10 days.   Kelli Churn RN, CCM, [facility] RN Care Manager [phone]

## 2020-03-09 ENCOUNTER — Ambulatory Visit: Payer: Self-pay | Admitting: *Deleted

## 2020-03-09 DIAGNOSIS — I739 Peripheral vascular disease, unspecified: Secondary | ICD-10-CM

## 2020-03-09 DIAGNOSIS — E1140 Type 2 diabetes mellitus with diabetic neuropathy, unspecified: Secondary | ICD-10-CM

## 2020-03-09 DIAGNOSIS — Z8679 Personal history of other diseases of the circulatory system: Secondary | ICD-10-CM

## 2020-03-09 NOTE — Chronic Care Management (AMB) (Signed)
  Chronic Care Management   Outreach Note  03/09/2020 Name: Donald Palmer MRN: [id number] DOB: [birth year]  Referred by: [name], MD Reason for referral : Chronic Care Management (Type 2 DM, HTN, PAD)   Third unsuccessful telephone outreach was attempted today. The patient was referred to the case management team for assistance with care management and care coordination. The patient's primary care provider has been notified of our unsuccessful attempts to make or maintain contact with the patient. The care management team is pleased to engage with this patient at any time in the future should he be interested in assistance from the care management team.   Follow Up Plan: No further follow up required: unable to successfully contact patient  Kelli Churn RN, CCM, [facility] RN Care Manager [phone]

## 2020-03-10 NOTE — Progress Notes (Signed)
Internal Medicine Clinic Resident   I have personally reviewed this encounter including the documentation in this note and/or discussed this patient with the care management provider. I will address any urgent items identified by the care management provider and will communicate my actions to the patient's PCP. I have reviewed the patient's CCM visit with my supervising attending.  Gabriella Guile, MD 03/10/2020    

## 2020-03-11 NOTE — Telephone Encounter (Signed)
Thank you Donna 

## 2020-03-11 NOTE — Telephone Encounter (Signed)
Called to follow up on restarting actos: Donald Palmer reports his blood sugar is  126 today/150 yesterday Monday it was 310 from eating at [employer]; [employer] root beer,  So he started taking victoza two shots a day.  To "get it down", says he will stop taking the victoza twice a day now that he picked up the Actos and started it Wednesday. He states he checks his feet and they are good. Confirmed follow up appointment with Dr. Lynnae January in May.

## 2020-03-14 NOTE — Progress Notes (Signed)
Internal Medicine Clinic Attending  CCM services provided by the care management provider and their documentation were discussed with Dr. Harbrecht. We reviewed the pertinent findings, urgent action items addressed by the resident and non-urgent items to be addressed by the PCP.  I agree with the assessment, diagnosis, and plan of care documented in the CCM and resident's note.  Yun Gutierrez, MD 03/14/2020 

## 2020-04-20 ENCOUNTER — Other Ambulatory Visit: Payer: Self-pay

## 2020-04-20 ENCOUNTER — Encounter (HOSPITAL_COMMUNITY): Payer: Self-pay | Admitting: Emergency Medicine

## 2020-04-20 ENCOUNTER — Emergency Department (HOSPITAL_COMMUNITY): Payer: Medicare HMO

## 2020-04-20 ENCOUNTER — Emergency Department (HOSPITAL_COMMUNITY)
Admission: EM | Admit: 2020-04-20 | Discharge: 2020-04-20 | Disposition: A | Discharge status: Home / Self Care | Payer: Medicare HMO | Attending: Emergency Medicine | Admitting: Emergency Medicine

## 2020-04-20 DIAGNOSIS — F17210 Nicotine dependence, cigarettes, uncomplicated: Secondary | ICD-10-CM | POA: Insufficient documentation

## 2020-04-20 DIAGNOSIS — Z794 Long term (current) use of insulin: Secondary | ICD-10-CM | POA: Diagnosis not present

## 2020-04-20 DIAGNOSIS — E1140 Type 2 diabetes mellitus with diabetic neuropathy, unspecified: Secondary | ICD-10-CM | POA: Diagnosis not present

## 2020-04-20 DIAGNOSIS — Z79899 Other long term (current) drug therapy: Secondary | ICD-10-CM | POA: Diagnosis not present

## 2020-04-20 DIAGNOSIS — F1210 Cannabis abuse, uncomplicated: Secondary | ICD-10-CM | POA: Diagnosis not present

## 2020-04-20 DIAGNOSIS — M79674 Pain in right toe(s): Secondary | ICD-10-CM | POA: Diagnosis not present

## 2020-04-20 DIAGNOSIS — M7989 Other specified soft tissue disorders: Secondary | ICD-10-CM | POA: Diagnosis not present

## 2020-04-20 DIAGNOSIS — F17290 Nicotine dependence, other tobacco product, uncomplicated: Secondary | ICD-10-CM | POA: Diagnosis not present

## 2020-04-20 MED ORDER — NAPROXEN 500 MG PO TABS: 500.0000 mg | ORAL_TABLET | Freq: Two times a day (BID) | ORAL | 0 refills | Status: DC

## 2020-04-20 MED ORDER — KETOROLAC TROMETHAMINE 15 MG/ML IJ SOLN
15.0000 mg | Freq: Once | INTRAMUSCULAR | Status: AC
  Administered 2020-04-20: 15 mg via INTRAMUSCULAR
  Filled 2020-04-20: qty 1

## 2020-04-20 NOTE — Discharge Instructions (Addendum)
You were seen today for toe pain.  Your x-rays are negative for fracture.  You are likely either suffering from a hotspot related to an ill fitting shoe versus inflammatory arthritis.  Take medications as prescribed.  Make sure that you are wearing well fitting shoes.  Monitor your skin closely.  Because you have diabetes, you are at high risk for ulceration and infection.

## 2020-04-20 NOTE — ED Provider Notes (Signed)
[facility] EMERGENCY DEPARTMENT Provider Note   CSN: [id number] Arrival date & time: 04/20/20  0355     History Chief Complaint  Patient presents with  . Toe Pain    Donald Palmer is a 59 y.o. male.  HPI     This a 59 year old male with history of diabetes, hypertension, peripheral artery disease who presents with right great toe pain.  Patient reports 2 to 3-day history of worsening right great toe pain.  He denies any history of inflammatory arthritis, gout, trauma.  He did start wearing some new shoes at work several weeks ago.  He states tonight that he woke up in pain and noted redness to the right great toe.  He rates his pain at 10 out of 10.  He has not taken anything for the pain.  Denies any fevers.  Denies any significant alcohol use or pork ingestion.  Denies numbness or tingling to the foot.  Past Medical History:  Diagnosis Date  . Back pain, chronic   . Diabetes mellitus 2007  . Hepatitis C    [id number]  . Hypertension goal BP (blood pressure) < 140/80   . Microcytic anemia   . Neuromuscular disorder ([place])   . PAD (peripheral artery disease) ([place])   . Pancreatitis 01/09/2012    Patient Active Problem List   Diagnosis Date Noted  . Claudication in peripheral vascular disease ([place]) 06/15/2019  . Diabetic mononeuropathy ([place]) 01/30/2019  . Podagra 07/29/2018  . PAD (peripheral artery disease) ([place]) 01/29/2018  . Vitamin D deficiency 12/24/2017  . Iron deficiency 12/27/2016  . Erectile dysfunction associated with type 2 diabetes mellitus ([place]) 03/01/2016  . Atherosclerosis of aorta ([place]) 03/30/2015  . Chronic pain syndrome 03/18/2015  . Hyperlipidemia associated with type 2 diabetes mellitus ([place]) 12/22/2014  . Allergic rhinitis 10/22/2014  . Preventative health care 12/08/2012  . GERD (gastroesophageal reflux disease) 07/08/2012  . History of chronic hepatitis C 05/13/2012  . Diabetic polyradiculopathy associated with type 2 diabetes  mellitus ([place]) 04/30/2012  . History of hypertension 01/30/2012  . Type 2 diabetes mellitus with diabetic neuropathy ([place]) 12/17/2001    Past Surgical History:  Procedure Laterality Date  . ABDOMINAL AORTOGRAM N/A 03/03/2018   Procedure: ABDOMINAL AORTOGRAM;  Surgeon: Lorretta Harp, MD;  Location: [facility];  Service: Cardiovascular;  Laterality: N/A;  . ABDOMINAL AORTOGRAM W/LOWER EXTREMITY N/A 06/15/2019   Procedure: ABDOMINAL AORTOGRAM W/LOWER EXTREMITY;  Surgeon: Lorretta Harp, MD;  Location: [facility];  Service: Cardiovascular;  Laterality: N/A;  . I & D EXTREMITY Right 05/15/2016   Procedure: RIGHT INDEX FINGER FIRST THROUGH  MIDDLE PHALYNX  AMPUTATION;  Surgeon: [name], MD;  Location: [facility];  Service: Orthopedics;  Laterality: Right;  . LOWER EXTREMITY INTERVENTION Bilateral 03/03/2018   Procedure: LOWER EXTREMITY INTERVENTION;  Surgeon: Lorretta Harp, MD;  Location: [facility];  Service: Cardiovascular;  Laterality: Bilateral;  . OPEN REDUCTION INTERNAL FIXATION (ORIF) DISTAL PHALANX Right 04/02/2016   Procedure: RIGHT INDEX FINGER REPAIR;  Surgeon: [name], MD;  Location: [facility];  Service: Orthopedics;  Laterality: Right;  . PERIPHERAL VASCULAR ATHERECTOMY Left 03/03/2018   Procedure: PERIPHERAL VASCULAR ATHERECTOMY;  Surgeon: Lorretta Harp, MD;  Location: [facility];  Service: Cardiovascular;  Laterality: Left;  SFA WITH PTA DRUG COATED BALLOON  . PERIPHERAL VASCULAR INTERVENTION Left 06/15/2019   Procedure: PERIPHERAL VASCULAR INTERVENTION;  Surgeon: Lorretta Harp, MD;  Location: [facility];  Service: Cardiovascular;  Laterality: Left;       Family History  Problem Relation Age of Onset  . Diabetes Mother   . Hypertension Mother   . Hyperlipidemia Mother   . Diabetes Brother   . Hypertension Brother   . Heart attack Neg Hx   . Sudden death Neg Hx     Social History   Tobacco Use    . Smoking status: Current Some Day Smoker    Packs/day: 0.10    Years: 30.00    Pack years: 3.00    Types: Cigarettes, Cigars  . Smokeless tobacco: Never Used  . Tobacco comment: 5 cigs per week  Substance Use Topics  . Alcohol use: Yes    Alcohol/week: 5.0 standard drinks    Types: 5 Cans of beer per week    Comment: Beer.  . Drug use: Yes    Frequency: 1.0 times per week    Types: Marijuana    Comment: rarely    Home Medications Prior to Admission medications   Medication Sig Start Date End Date Taking? Authorizing Provider  Accu-Chek FastClix Lancets MISC USE TO CHECK BLOOD SUGAR TWICE DAILY 11/26/19   [name], MD  ACCU-CHEK GUIDE test strip USE TO CHECK GLUCOSE TWICE DAILY 02/12/20   [name], MD  atorvastatin (LIPITOR) 80 MG tablet Take 1 tablet (80 mg total) by mouth daily at 6 PM. 02/16/20   [name], MD  blood glucose meter kit and supplies KIT Use to check glucose twice a day 10/08/19   [name], MD  Blood Glucose Monitoring Suppl (ACCU-CHEK AVIVA PLUS) W/DEVICE KIT 1 each by Does not apply route 2 (two) times daily. To check blood sugar twice daily. diag code E11.40. Non insulin dependent. Please check with pt that this is the equipment he needs. 09/08/15   [name], MD  clopidogrel (PLAVIX) 75 MG tablet Take 1 tablet (75 mg total) by mouth daily. 06/16/19   Cheryln Manly, NP  cyclobenzaprine (FLEXERIL) 10 MG tablet TAKE 1 TABLET BY MOUTH AT BEDTIME AS NEEDED FOR MUSCLE SPASMS 01/11/20   [name], MD  fluticasone [facility]) 50 MCG/ACT nasal spray USE 2 SPRAYS IN [facility] NOSTRIL DAILY 02/18/20   [name], MD  Insulin Pen Needle (PEN NEEDLES) 32G X 4 MM MISC 1 each by Does not apply route daily. Use to injected liraglutide once a day 10/28/19   [name], MD  Lancet Devices MISC 1 each by Does not apply route 2 (two) times daily. Please use to check blood sugar 2 times daily. diag code  E11.40. noninsulin dependent 09/01/15   [name], MD  naproxen (NAPROSYN) 500 MG tablet Take 1 tablet (500 mg total) by mouth 2 (two) times daily. 04/20/20   Londen Bok, Barbette Hair, MD  pantoprazole (PROTONIX) 40 MG tablet TAKE 1 TABLET BY MOUTH DAILY 01/06/20   [name], MD  pioglitazone (ACTOS) 30 MG tablet Take 1 tablet (30 mg total) by mouth daily. 02/24/20   [name], MD  pregabalin (LYRICA) 100 MG capsule Take one pill in the morning and two pills at bedtime. 01/28/20   [name], MD  VICTOZA 18 MG/3ML SOPN ADMINISTER 1.2 MG UNDER THE SKIN DAILY 02/02/20   [name], MD    Allergies    Metformin and related  Review of Systems   Review of Systems  Constitutional: Negative for fever.  Musculoskeletal:  Right great toe pain  Skin: Positive for color change.  Neurological: Negative for weakness and numbness.  All other systems reviewed and are negative.   Physical Exam Updated Vital Signs BP 137/88 (BP Location: Right Arm)   Pulse 100   Temp 98.6 F (37 C) (Oral)   Resp 16   Ht 1.753 m (5' 9" )   Wt 90 kg   SpO2 100%   BMI 29.30 kg/m   Physical Exam Vitals and nursing note reviewed.  Constitutional:      Appearance: He is well-developed.  HENT:     Head: Normocephalic and atraumatic.     Nose: Nose normal.     Mouth/Throat:     Mouth: Mucous membranes are moist.  Eyes:     Pupils: Pupils are equal, round, and reactive to light.  Cardiovascular:     Rate and Rhythm: Normal rate and regular rhythm.     Heart sounds: Normal heart sounds. No murmur.  Pulmonary:     Effort: Pulmonary effort is normal. No respiratory distress.     Breath sounds: Normal breath sounds. No wheezing.  Abdominal:     Palpations: Abdomen is soft.     Tenderness: There is no abdominal tenderness.  Musculoskeletal:     Cervical back: Neck supple.     Comments: Tenderness palpation over the lateral aspect of the first MTP joint, slight  swelling noted, there is punctate erythema over the overlying skin of the MTP joint that does not extend proximally, normal range of motion of the toes, 1+ DP pulse  Skin:    General: Skin is warm and dry.  Neurological:     Mental Status: He is alert and oriented to person, place, and time.  Psychiatric:        Mood and Affect: Mood normal.       ED Results / Procedures / Treatments   Labs (all labs ordered are listed, but only abnormal results are displayed) Labs Reviewed - No data to display  EKG None  Radiology DG Toe Great Right  Result Date: 04/20/2020 CLINICAL DATA:  Pain, erythema EXAM: RIGHT GREAT TOE COMPARISON:  None. FINDINGS: Mild soft tissue swelling of the first digit. No radiographically evident ulceration, soft tissue gas or foreign body. No acute bony abnormality. Specifically, no fracture, subluxation, or dislocation. No erosion, periostitis or destructive change to suggest radiographic evidence of osteomyelitis. IMPRESSION: 1. Mild soft tissue swelling of the first digit. No radiographically evident ulceration, soft tissue gas or foreign body. 2. No acute bony abnormality, specifically no acute fracture or radiograph evidence of osteomyelitis. Electronically Signed   By: Lovena Le M.D.   On: 04/20/2020 04:45    Procedures Procedures (including critical care time)  Medications Ordered in ED Medications  ketorolac (TORADOL) 15 MG/ML injection 15 mg (15 mg Intramuscular Given 04/20/20 0433)    ED Course  I have reviewed the triage vital signs and the nursing notes.  Pertinent labs & imaging results that were available during my care of the patient were reviewed by me and considered in my medical decision making (see chart for details).    MDM Rules/Calculators/A&P                       Patient presents with great toe pain.  He has a small area of erythema right over the bony prominence of the MTP.  Recently reports new shoes.  It appears most consistent  with a small hotspot likely related  to ill fitting shoes.  Low suspicion at this time for infection.  Other considerations include inflammatory arthritis such as gout.  He denies any significant injury.  X-rays are negative for fracture.  Given his diabetes, I discussed with him that he needs to closely monitor this as if it ulcerates it could become infected.  Recommend getting well fitting shoes and monitoring closely.  Ice and naproxen as needed for pain.  After history, exam, and medical workup I feel the patient has been appropriately medically screened and is safe for discharge home. Pertinent diagnoses were discussed with the patient. Patient was given return precautions.   Final Clinical Impression(s) / ED Diagnoses Final diagnoses:  Pain of right great toe    Rx / DC Orders ED Discharge Orders         Ordered    naproxen (NAPROSYN) 500 MG tablet  2 times daily     04/20/20 0505           Philipe Laswell, Barbette Hair, MD 04/20/20 [id number]

## 2020-04-20 NOTE — ED Triage Notes (Signed)
Patient reports right great toe pain with swelling onset this week , denies injury/ambulatory.

## 2020-04-20 NOTE — ED Notes (Signed)
Pt ambulated to room, noted to have a steady gait. °

## 2020-04-20 NOTE — ED Notes (Signed)
Discharge instructions reviewed with pt. Pt verbalized understanding.   

## 2020-04-28 ENCOUNTER — Ambulatory Visit (INDEPENDENT_AMBULATORY_CARE_PROVIDER_SITE_OTHER): Payer: Medicare HMO | Admitting: Internal Medicine

## 2020-04-28 ENCOUNTER — Other Ambulatory Visit: Payer: Self-pay

## 2020-04-28 VITALS — BP 120/78 | HR 77 | Temp 97.6°F | Ht 69.0 in | Wt 179.0 lb

## 2020-04-28 DIAGNOSIS — Z8679 Personal history of other diseases of the circulatory system: Secondary | ICD-10-CM

## 2020-04-28 DIAGNOSIS — N521 Erectile dysfunction due to diseases classified elsewhere: Secondary | ICD-10-CM | POA: Diagnosis not present

## 2020-04-28 DIAGNOSIS — E1140 Type 2 diabetes mellitus with diabetic neuropathy, unspecified: Secondary | ICD-10-CM

## 2020-04-28 DIAGNOSIS — K219 Gastro-esophageal reflux disease without esophagitis: Secondary | ICD-10-CM

## 2020-04-28 DIAGNOSIS — E1169 Type 2 diabetes mellitus with other specified complication: Secondary | ICD-10-CM | POA: Diagnosis not present

## 2020-04-28 DIAGNOSIS — I739 Peripheral vascular disease, unspecified: Secondary | ICD-10-CM | POA: Diagnosis not present

## 2020-04-28 DIAGNOSIS — Z0000 Encounter for general adult medical examination without abnormal findings: Secondary | ICD-10-CM

## 2020-04-28 DIAGNOSIS — Z7984 Long term (current) use of oral hypoglycemic drugs: Secondary | ICD-10-CM

## 2020-04-28 DIAGNOSIS — G894 Chronic pain syndrome: Secondary | ICD-10-CM | POA: Diagnosis not present

## 2020-04-28 LAB — GLUCOSE, CAPILLARY: Glucose-Capillary: 169 mg/dL — ABNORMAL HIGH (ref 70–99)

## 2020-04-28 LAB — POCT GLYCOSYLATED HEMOGLOBIN (HGB A1C): Hemoglobin A1C: 8.2 % — AB (ref 4.0–5.6)

## 2020-04-28 MED ORDER — PIOGLITAZONE HCL 45 MG PO TABS
45.0000 mg | ORAL_TABLET | Freq: Every day | ORAL | 1 refills | Status: DC
Start: 2020-04-28 — End: 2020-09-26

## 2020-04-28 MED ORDER — KETOROLAC TROMETHAMINE 30 MG/ML IJ SOLN
30.0000 mg | Freq: Once | INTRAMUSCULAR | Status: AC
  Administered 2020-04-28: 30 mg via INTRAMUSCULAR

## 2020-04-28 MED ORDER — TADALAFIL 10 MG PO TABS
10.0000 mg | ORAL_TABLET | ORAL | 0 refills | Status: DC | PRN
Start: 2020-04-28 — End: 2021-06-05

## 2020-04-28 MED ORDER — OMEPRAZOLE 40 MG PO CPDR
40.0000 mg | DELAYED_RELEASE_CAPSULE | Freq: Every day | ORAL | 1 refills | Status: DC
Start: 2020-04-28 — End: 2020-09-26

## 2020-04-28 NOTE — Assessment & Plan Note (Signed)
This problem is chronic and uncontrolled.  He currently is on pantoprazole and states that it makes him sick and he throws up 1 to 2 hours after taking it because it gets stuck in his distal esophagus.  He states he has been crushing it up before taking it.  He is taking it because he is getting epigastric pain for the past 1 to 2 days.  He denies dysphagia to any other medications.  He denies nonsteroidal use but on review, the ER gave him naproxen and he states he is taking it twice a day.  He denies any alcohol usage.  He states that prior stomach pill, which chart review shows it is omeprazole 40, doing much better job and he requests returning to omeprazole.   PLAN : Start pantoprazole Omeprazole 40 daily CBC as he is having epigastric pain, new nonsteroidal usage, and I worry about gastritis

## 2020-04-28 NOTE — Assessment & Plan Note (Signed)
This problem is chronic and uncontrolled today as he is having a flare of his right-sided back pain with radiation into his right groin.  It started this morning and he states it is very similar to every other flare he has had.  He requests a Toradol injection as it has worked in the past.  Has not affected his gait or ability to drive.  He is on a muscle relaxer and Lyrica 100 mg in the morning and 200 mg at night for his chronic pain issues.  Additionally, the ER started naproxen May 5 for his toe pain and he is taking that as prescribed.  PLAN:  Cont current meds Toradol 30 mg IM 1 dose only I stressed that he needed to take the naproxen with food

## 2020-04-28 NOTE — Assessment & Plan Note (Signed)
This problem is chronic and well controlled.  He is having no recurrence of his claudication pain.  He has prescribed atorvastatin 80 and Plavix 75.  I did not check adherence of these medications today.  PLAN:  Cont current meds

## 2020-04-28 NOTE — Patient Instructions (Signed)
1. Take the toe medicine with food! 2. I will increase your sugar pill to 45mg  to help your sugar 3. Try the new stomach pill

## 2020-04-28 NOTE — Assessment & Plan Note (Addendum)
This problem is chronic and uncontrolled.  His A1c trend has been 9 - 9.6 - 8.2 today.  He is on liraglutide 1.2 weekly and Actos 30 mg daily.  The Actos was added at his most recent appointment.  He is having no side effects to either of these medicines.  He states his blood sugar has been better controlled after he moved out of his girlfriend's home.  The lowest he has seen has been 50 and he states he does not feel great and takes candy.  He prefers his sugar to be 130-150 range as that is when he feels best.  He states that he is checking his CBG every other day.  We downloaded his meter and he tested 7 times over the past month.  The average was 185, lowest 89 and highest 225.  As he is young with comorbidities, including neuropathy and peripheral vascular disease and erectile dysfunction, I would like to get his A1c closer to 7.  He agreed to increase his Actos to 45 mg although I doubt this is going to get him at goal and will need to incrase his liraglutide to 1.8 and he might need a third agent at his next appointment.  PLAN:  Cont current meds except increase Actos to 45 mg Return to clinic in 3 months BMP

## 2020-04-28 NOTE — Progress Notes (Signed)
   Subjective:    [name], male    DOB: 1961/03/20, 59 y.o.   MRN: [id number]  HPI  Donald Palmer is here for DM FU. Please see the A&P for the status of the pt's chronic medical problems.  ROS : per ROS section and in problem oriented charting. All other systems are negative.  PMHx, Soc hx, and / or Fam hx : She is still working at [employer] about 3 days a week.  He left his girlfriend and has been able to decrease his smoking to about 3 or 4 cigarettes a week.  He moved out of the girlfriend's house into his own apartment.  Review of Systems  Constitutional: Positive for fatigue. Negative for fever and unexpected weight change.  Respiratory: Negative for cough.   Cardiovascular: Negative for chest pain.  Gastrointestinal: Positive for abdominal pain.  Musculoskeletal: Positive for back pain.  Psychiatric/Behavioral: Negative for sleep disturbance.  He is feeling poorly today after waking up with recurrence of his right back pain that is going into his groin.     Objective:   Physical Exam Constitutional:      General: He is not in acute distress.    Appearance: Normal appearance. He is normal weight. He is not ill-appearing, toxic-appearing or diaphoretic.  HENT:     Head: Normocephalic and atraumatic.     Right Ear: External ear normal.     Left Ear: External ear normal.     Nose: Nose normal. No rhinorrhea.  Eyes:     Extraocular Movements: Extraocular movements intact.     Conjunctiva/sclera: Conjunctivae normal.  Skin:    Comments: Multiple Tattoos, no visible lesions  Neurological:     General: No focal deficit present.     Mental Status: He is alert. Mental status is at baseline.  Psychiatric:        Mood and Affect: Mood normal.        Behavior: Behavior normal.        Thought Content: Thought content normal.        Judgment: Judgment normal.       Assessment & Plan:

## 2020-04-28 NOTE — Assessment & Plan Note (Signed)
He does not intend to take the Covid vaccine and did not have any questions.  He does need colon cancer screening but we did not discuss that today.

## 2020-04-28 NOTE — Assessment & Plan Note (Signed)
This problem is chronic and uncontrolled.  It is likely a consequence of his uncontrolled diabetes and peripheral arterial disease.  He states the Viagra was too expensive and requests trying Cialis.  I prescribed Cialis in 2017 but unclear if he ever received it.  He is not on any nitrates.  I think he is healthy enough to have sexual intercourse.  I prescribed Cialis so he could check on the price.  PLAN : Cialis

## 2020-04-28 NOTE — Assessment & Plan Note (Signed)
This problem is chronic and stable.  His blood pressure is well controlled today without any pharmaceuticals.  The blood pressure on May 5 was when he was in pain in the ER and is not accurate.  Continue to follow.  PLAN: follow   BP Readings from Last 3 Encounters:  04/28/20 120/78  04/20/20 (!) 137/107  01/28/20 127/76

## 2020-04-29 LAB — CBC
Hematocrit: 48.1 % (ref 37.5–51.0)
Hemoglobin: 14.7 g/dL (ref 13.0–17.7)
MCH: 22.5 pg — ABNORMAL LOW (ref 26.6–33.0)
MCHC: 30.6 g/dL — ABNORMAL LOW (ref 31.5–35.7)
MCV: 74 fL — ABNORMAL LOW (ref 79–97)
Platelets: 322 10*3/uL (ref 150–450)
RBC: 6.53 x10E6/uL — ABNORMAL HIGH (ref 4.14–5.80)
RDW: 15.8 % — ABNORMAL HIGH (ref 11.6–15.4)
WBC: 5.5 10*3/uL (ref 3.4–10.8)

## 2020-04-29 LAB — BMP8+ANION GAP
Anion Gap: 13 mmol/L (ref 10.0–18.0)
BUN/Creatinine Ratio: 9 (ref 9–20)
BUN: 10 mg/dL (ref 6–24)
CO2: 25 mmol/L (ref 20–29)
Calcium: 9.8 mg/dL (ref 8.7–10.2)
Chloride: 99 mmol/L (ref 96–106)
Creatinine, Ser: 1.07 mg/dL (ref 0.76–1.27)
GFR calc Af Amer: 87 mL/min/{1.73_m2} (ref >59)
GFR calc non Af Amer: 76 mL/min/{1.73_m2} (ref >59)
Glucose: 127 mg/dL — ABNORMAL HIGH (ref 65–99)
Potassium: 5 mmol/L (ref 3.5–5.2)
Sodium: 137 mmol/L (ref 134–144)

## 2020-06-06 ENCOUNTER — Encounter: Payer: Self-pay | Admitting: *Deleted

## 2020-06-10 ENCOUNTER — Emergency Department (HOSPITAL_COMMUNITY)
Admission: EM | Admit: 2020-06-10 | Discharge: 2020-06-10 | Disposition: A | Discharge status: Home / Self Care | Payer: Medicare HMO | Attending: Emergency Medicine | Admitting: Emergency Medicine

## 2020-06-10 ENCOUNTER — Other Ambulatory Visit: Payer: Self-pay

## 2020-06-10 ENCOUNTER — Encounter (HOSPITAL_COMMUNITY): Payer: Self-pay

## 2020-06-10 DIAGNOSIS — Y939 Activity, unspecified: Secondary | ICD-10-CM | POA: Insufficient documentation

## 2020-06-10 DIAGNOSIS — F17210 Nicotine dependence, cigarettes, uncomplicated: Secondary | ICD-10-CM | POA: Insufficient documentation

## 2020-06-10 DIAGNOSIS — W57XXXA Bitten or stung by nonvenomous insect and other nonvenomous arthropods, initial encounter: Secondary | ICD-10-CM | POA: Diagnosis not present

## 2020-06-10 DIAGNOSIS — Z7902 Long term (current) use of antithrombotics/antiplatelets: Secondary | ICD-10-CM | POA: Diagnosis not present

## 2020-06-10 DIAGNOSIS — I10 Essential (primary) hypertension: Secondary | ICD-10-CM | POA: Diagnosis not present

## 2020-06-10 DIAGNOSIS — Z79899 Other long term (current) drug therapy: Secondary | ICD-10-CM | POA: Insufficient documentation

## 2020-06-10 DIAGNOSIS — Z7984 Long term (current) use of oral hypoglycemic drugs: Secondary | ICD-10-CM | POA: Insufficient documentation

## 2020-06-10 DIAGNOSIS — Y999 Unspecified external cause status: Secondary | ICD-10-CM | POA: Diagnosis not present

## 2020-06-10 DIAGNOSIS — E119 Type 2 diabetes mellitus without complications: Secondary | ICD-10-CM | POA: Diagnosis not present

## 2020-06-10 DIAGNOSIS — S90862A Insect bite (nonvenomous), left foot, initial encounter: Secondary | ICD-10-CM | POA: Insufficient documentation

## 2020-06-10 DIAGNOSIS — Y929 Unspecified place or not applicable: Secondary | ICD-10-CM | POA: Insufficient documentation

## 2020-06-10 MED ORDER — CEPHALEXIN 500 MG PO CAPS
500.0000 mg | ORAL_CAPSULE | Freq: Three times a day (TID) | ORAL | 0 refills | Status: AC
Start: 2020-06-10 — End: 2020-06-15

## 2020-06-10 NOTE — ED Provider Notes (Signed)
[facility] EMERGENCY DEPARTMENT Provider Note   CSN: [id number] Arrival date & time: 06/10/20  1110     History Chief Complaint  Patient presents with  . Insect Bite    Donald Palmer is a 59 y.o. male.  HPI  Patient is a 59 year old diabetic male presented today with left foot pain.  He states that he has had a bump on his foot for the past 3 weeks.  He states that it is achy and uncomfortable he denies any drainage from the area but states that there is a area of swelling.  He denies any fevers, chills nausea vomiting lightheadedness or dizziness.  He states he is able to walk but that is painful to do so.  Denies any difficulty moving his ankle or foot.  He denies any worsening in the area.     Past Medical History:  Diagnosis Date  . Back pain, chronic   . Diabetes mellitus 2007  . Hepatitis C    [id number]  . Hypertension goal BP (blood pressure) < 140/80   . Microcytic anemia   . Neuromuscular disorder ([place])   . PAD (peripheral artery disease) ([place])   . Pancreatitis 01/09/2012    Patient Active Problem List   Diagnosis Date Noted  . Claudication in peripheral vascular disease ([place]) 06/15/2019  . Diabetic mononeuropathy ([place]) 01/30/2019  . Podagra 07/29/2018  . PAD (peripheral artery disease) ([place]) 01/29/2018  . Vitamin D deficiency 12/24/2017  . Iron deficiency 12/27/2016  . Erectile dysfunction associated with type 2 diabetes mellitus ([place]) 03/01/2016  . Atherosclerosis of aorta ([place]) 03/30/2015  . Chronic pain syndrome 03/18/2015  . Hyperlipidemia associated with type 2 diabetes mellitus ([place]) 12/22/2014  . Allergic rhinitis 10/22/2014  . Preventative health care 12/08/2012  . GERD (gastroesophageal reflux disease) 07/08/2012  . History of chronic hepatitis C 05/13/2012  . Diabetic polyradiculopathy associated with type 2 diabetes mellitus ([place]) 04/30/2012  . History of hypertension 01/30/2012  . Type 2 diabetes mellitus with diabetic  neuropathy ([place]) 12/17/2001    Past Surgical History:  Procedure Laterality Date  . ABDOMINAL AORTOGRAM N/A 03/03/2018   Procedure: ABDOMINAL AORTOGRAM;  Surgeon: Lorretta Harp, MD;  Location: [facility];  Service: Cardiovascular;  Laterality: N/A;  . ABDOMINAL AORTOGRAM W/LOWER EXTREMITY N/A 06/15/2019   Procedure: ABDOMINAL AORTOGRAM W/LOWER EXTREMITY;  Surgeon: Lorretta Harp, MD;  Location: [facility];  Service: Cardiovascular;  Laterality: N/A;  . I & D EXTREMITY Right 05/15/2016   Procedure: RIGHT INDEX FINGER FIRST THROUGH  MIDDLE PHALYNX  AMPUTATION;  Surgeon: [name], MD;  Location: [facility];  Service: Orthopedics;  Laterality: Right;  . LOWER EXTREMITY INTERVENTION Bilateral 03/03/2018   Procedure: LOWER EXTREMITY INTERVENTION;  Surgeon: Lorretta Harp, MD;  Location: [facility];  Service: Cardiovascular;  Laterality: Bilateral;  . OPEN REDUCTION INTERNAL FIXATION (ORIF) DISTAL PHALANX Right 04/02/2016   Procedure: RIGHT INDEX FINGER REPAIR;  Surgeon: [name], MD;  Location: [facility];  Service: Orthopedics;  Laterality: Right;  . PERIPHERAL VASCULAR ATHERECTOMY Left 03/03/2018   Procedure: PERIPHERAL VASCULAR ATHERECTOMY;  Surgeon: Lorretta Harp, MD;  Location: [facility];  Service: Cardiovascular;  Laterality: Left;  SFA WITH PTA DRUG COATED BALLOON  . PERIPHERAL VASCULAR INTERVENTION Left 06/15/2019   Procedure: PERIPHERAL VASCULAR INTERVENTION;  Surgeon: Lorretta Harp, MD;  Location: [facility];  Service: Cardiovascular;  Laterality: Left;       Family History  Problem Relation Age of Onset  . Diabetes Mother   . Hypertension Mother   . Hyperlipidemia Mother   . Diabetes Brother   . Hypertension Brother   . Heart attack Neg Hx   . Sudden death Neg Hx     Social History   Tobacco Use  . Smoking status: Current Some Day Smoker    Packs/day: 0.10    Years: 30.00    Pack years: 3.00     Types: Cigarettes, Cigars  . Smokeless tobacco: Never Used  . Tobacco comment: 5 cigs per week  Vaping Use  . Vaping Use: Never used  Substance Use Topics  . Alcohol use: Yes    Alcohol/week: 5.0 standard drinks    Types: 5 Cans of beer per week    Comment: Beer.  . Drug use: Yes    Frequency: 1.0 times per week    Types: Marijuana    Comment: rarely    Home Medications Prior to Admission medications   Medication Sig Start Date End Date Taking? Authorizing Provider  Accu-Chek FastClix Lancets MISC USE TO CHECK BLOOD SUGAR TWICE DAILY 11/26/19   [name], MD  ACCU-CHEK GUIDE test strip USE TO CHECK GLUCOSE TWICE DAILY 02/12/20   [name], MD  atorvastatin (LIPITOR) 80 MG tablet Take 1 tablet (80 mg total) by mouth daily at 6 PM. 02/16/20   [name], MD  blood glucose meter kit and supplies KIT Use to check glucose twice a day 10/08/19   [name], MD  Blood Glucose Monitoring Suppl (ACCU-CHEK AVIVA PLUS) W/DEVICE KIT 1 each by Does not apply route 2 (two) times daily. To check blood sugar twice daily. diag code E11.40. Non insulin dependent. Please check with pt that this is the equipment he needs. 09/08/15   [name], MD  cephALEXin (KEFLEX) 500 MG capsule Take 1 capsule (500 mg total) by mouth 3 (three) times daily for 5 days. 06/10/20 06/15/20  Tedd Sias, PA  clopidogrel (PLAVIX) 75 MG tablet Take 1 tablet (75 mg total) by mouth daily. 06/16/19   Cheryln Manly, NP  cyclobenzaprine (FLEXERIL) 10 MG tablet TAKE 1 TABLET BY MOUTH AT BEDTIME AS NEEDED FOR MUSCLE SPASMS 01/11/20   [name], MD  fluticasone [facility]) 50 MCG/ACT nasal spray USE 2 SPRAYS IN [facility] NOSTRIL DAILY 02/18/20   [name], MD  Insulin Pen Needle (PEN NEEDLES) 32G X 4 MM MISC 1 each by Does not apply route daily. Use to injected liraglutide once a day 10/28/19   [name], MD  Lancet Devices MISC 1 each by Does not apply route  2 (two) times daily. Please use to check blood sugar 2 times daily. diag code E11.40. noninsulin dependent 09/01/15   [name], MD  naproxen (NAPROSYN) 500 MG tablet Take 1 tablet (500 mg total) by mouth 2 (two) times daily. 04/20/20   Horton, Barbette Hair, MD  omeprazole (PRILOSEC) 40 MG capsule Take 1 capsule (40 mg total) by mouth daily. 04/28/20   [name], MD  pantoprazole (PROTONIX) 40 MG tablet TAKE 1 TABLET BY MOUTH DAILY 01/06/20   [name], MD  pioglitazone (ACTOS) 45 MG tablet Take 1 tablet (45 mg total) by mouth daily. 04/28/20 04/28/21  [name], MD  pregabalin (LYRICA) 100 MG capsule Take one pill in the morning and two pills at bedtime. 01/28/20   [name], MD  tadalafil (CIALIS) 10 MG tablet  Take 1 tablet (10 mg total) by mouth as needed for erectile dysfunction. 04/28/20 04/28/21  [name], MD  VICTOZA 18 MG/3ML SOPN ADMINISTER 1.2 MG UNDER THE SKIN DAILY 02/02/20   [name], MD    Allergies    Metformin and related  Review of Systems   Review of Systems  Constitutional: Negative for chills and fever.  HENT: Negative for congestion.   Respiratory: Negative for shortness of breath.   Cardiovascular: Negative for chest pain.  Gastrointestinal: Negative for abdominal pain.  Musculoskeletal: Negative for neck pain.  Skin:       Insect bite    Physical Exam Updated Vital Signs BP 109/67 (BP Location: Right Arm)   Pulse 83   Temp 98.9 F (37.2 C) (Oral)   Resp 18   Ht _0  (1.676 m)   Wt 83.9 kg   SpO2 100%   BMI 29.86 kg/m   Physical Exam Vitals and nursing note reviewed.  Constitutional:      General: He is not in acute distress.    Appearance: Normal appearance. He is not ill-appearing.  HENT:     Head: Normocephalic and atraumatic.  Eyes:     General: No scleral icterus.       Right eye: No discharge.        Left eye: No discharge.     Conjunctiva/sclera: Conjunctivae normal.    Pulmonary:     Effort: Pulmonary effort is normal.     Breath sounds: No stridor.  Musculoskeletal:     Comments: See skin. FROM of left foot and ankle  Skin:    General: Skin is warm and dry.     Capillary Refill: Capillary refill takes less than 2 seconds.     Comments: There is a 1 cm in diameter dark skin tone colored raised area.  No discharge.  Very mild fluctuance-needle aspiration shows blood with no discharge.  Area is tender to touch.  No significant erythema.  Neurological:     Mental Status: He is alert and oriented to person, place, and time. Mental status is at baseline.  Psychiatric:        Mood and Affect: Mood normal.        Behavior: Behavior normal.     ED Results / Procedures / Treatments   Labs (all labs ordered are listed, but only abnormal results are displayed) Labs Reviewed - No data to display  EKG None  Radiology No results found.  Procedures .Marland KitchenIncision and Drainage  Date/Time: 06/10/2020 10:36 PM Performed by: Tedd Sias, PA Authorized by: Tedd Sias, PA   Consent:    Consent obtained:  Verbal   Consent given by:  Patient   Risks discussed:  Bleeding, incomplete drainage, infection and pain   Alternatives discussed:  No treatment, delayed treatment, alternative treatment, observation and referral Location:    Indications for incision and drainage: Circular raised lesion.   Size:  1 cm   Location:  Lower extremity   Lower extremity location:  Foot   Foot location:  L foot Pre-procedure details:    Skin preparation:  Chloraprep Anesthesia (see MAR for exact dosages):    Anesthesia method:  None Procedure type:    Complexity:  Simple Procedure details:    Needle aspiration: yes     Needle size:  18 G   Drainage:  Bloody   Drainage amount:  Scant   Wound treatment:  Wound left open   Packing materials:  None  Post-procedure details:    Patient tolerance of procedure:  Tolerated well, no immediate complications    (including critical care time)  Medications Ordered in ED Medications - No data to display  ED Course  I have reviewed the triage vital signs and the nursing notes.  Pertinent labs & imaging results that were available during my care of the patient were reviewed by me and considered in my medical decision making (see chart for details).    MDM Rules/Calculators/A&P                          Patient is 59 year old diabetic gentleman with a area of concern to the top of his left foot.  Is about a 1 cm raised area with no erythema and no purulent drainage.  Is was very mildly fluctuant I used an 18-gauge needle to deroof it.  There is no purulent drainage there was some bleeding.  Patient is very concerned about infection he is afebrile and well-appearing however as it is on his foot and he is diabetic I will go ahead and place him on Keflex.  He will follow-up with his primary care doctor I recommended a podiatrist.  Final Clinical Impression(s) / ED Diagnoses Final diagnoses:  Insect bite of left foot, initial encounter    Rx / DC Orders ED Discharge Orders         Ordered    cephALEXin (KEFLEX) 500 MG capsule  3 times daily     Discontinue  Reprint     06/10/20 [place], Jerico Grisso S, [place] 06/10/20 [place], [place], DO 06/13/20 1628

## 2020-06-10 NOTE — ED Triage Notes (Signed)
Pt reports insect bite to left upper foot for the past 3 weeks, no drainage noted at this time or any significant swelling or redness. Pt c.o pain to the area. Pt ambulatory.

## 2020-06-10 NOTE — Discharge Instructions (Signed)
Please take antibiotics as prescribed.  Please follow-up with your primary care doctor for reevaluation of the wound.  Please keep area clean with warm soapy water and pat dry.  Keep covered when outside.  Use tylenol 1000mg  every 6 hours for pain.

## 2020-08-29 ENCOUNTER — Encounter: Payer: Medicare HMO | Admitting: Student in an Organized Health Care Education/Training Program

## 2020-09-26 ENCOUNTER — Encounter: Payer: Self-pay | Admitting: Student in an Organized Health Care Education/Training Program

## 2020-09-26 ENCOUNTER — Ambulatory Visit (INDEPENDENT_AMBULATORY_CARE_PROVIDER_SITE_OTHER): Payer: Medicare HMO | Admitting: Student in an Organized Health Care Education/Training Program

## 2020-09-26 VITALS — BP 132/75 | HR 100 | Temp 97.6°F | Ht 69.0 in | Wt 181.0 lb

## 2020-09-26 DIAGNOSIS — K219 Gastro-esophageal reflux disease without esophagitis: Secondary | ICD-10-CM

## 2020-09-26 DIAGNOSIS — M109 Gout, unspecified: Secondary | ICD-10-CM | POA: Diagnosis not present

## 2020-09-26 DIAGNOSIS — E1140 Type 2 diabetes mellitus with diabetic neuropathy, unspecified: Secondary | ICD-10-CM

## 2020-09-26 DIAGNOSIS — M7581 Other shoulder lesions, right shoulder: Secondary | ICD-10-CM | POA: Diagnosis not present

## 2020-09-26 DIAGNOSIS — M7582 Other shoulder lesions, left shoulder: Secondary | ICD-10-CM

## 2020-09-26 DIAGNOSIS — I739 Peripheral vascular disease, unspecified: Secondary | ICD-10-CM | POA: Diagnosis not present

## 2020-09-26 DIAGNOSIS — E1142 Type 2 diabetes mellitus with diabetic polyneuropathy: Secondary | ICD-10-CM | POA: Diagnosis not present

## 2020-09-26 LAB — POCT GLYCOSYLATED HEMOGLOBIN (HGB A1C): Hemoglobin A1C: 8.8 % — AB (ref 4.0–5.6)

## 2020-09-26 LAB — GLUCOSE, CAPILLARY: Glucose-Capillary: 366 mg/dL — ABNORMAL HIGH (ref 70–99)

## 2020-09-26 MED ORDER — CYCLOBENZAPRINE HCL 10 MG PO TABS: 10.0000 mg | ORAL_TABLET | Freq: Every day | ORAL | 2 refills | Status: DC | PRN

## 2020-09-26 MED ORDER — PREGABALIN 100 MG PO CAPS: ORAL_CAPSULE | ORAL | 2 refills | Status: DC

## 2020-09-26 MED ORDER — CLOPIDOGREL BISULFATE 75 MG PO TABS: 75.0000 mg | ORAL_TABLET | Freq: Every day | ORAL | 3 refills | Status: DC

## 2020-09-26 MED ORDER — XULTOPHY 100-3.6 UNIT-MG/ML ~~LOC~~ SOPN: 16.0000 [IU] | PEN_INJECTOR | Freq: Every day | SUBCUTANEOUS | 5 refills | Status: DC

## 2020-09-26 MED ORDER — ATORVASTATIN CALCIUM 80 MG PO TABS: 80.0000 mg | ORAL_TABLET | Freq: Every day | ORAL | 3 refills | Status: DC

## 2020-09-26 MED ORDER — NAPROXEN 500 MG PO TABS: 500.0000 mg | ORAL_TABLET | Freq: Two times a day (BID) | ORAL | 2 refills | Status: DC | PRN

## 2020-09-26 MED ORDER — OMEPRAZOLE 40 MG PO CPDR: 40.0000 mg | DELAYED_RELEASE_CAPSULE | Freq: Every day | ORAL | 1 refills | Status: DC

## 2020-09-26 NOTE — Assessment & Plan Note (Signed)
1 episode of acute inflammation of the right first MTP about 5 months ago, very consistent with a diagnosis of gout.  At risk due to diabetes and metabolic syndrome.  We will plan to treat flares as needed.  Refilled naproxen.  If he has more flares in the future, will talk to him about allopurinol for prophylaxis.  However would like to see much better consistency with medication adherence before trying allopurinol.

## 2020-09-26 NOTE — Progress Notes (Signed)
   Assessment and Plan:  See Encounters tab for problem-based medical decision making.   __________________________________________________________  HPI:   59 year old person here for follow-up of diabetes.  This is my first time meeting the patient, former patient of Dr. Lynnae January.  He recently moved to [place], lives with his partner.  Brought his medications in today and there were a lot of redundant pill bottles, he does not have an organization system right now for taking his medications.  Reports that he takes the Victoza intermittently.  Currently on disability since 2013, used to work as a Dealer.  He is on disability due to diabetic neuropathy.  He has 4 children, all grown, they live in [place].  No recent illnesses, no admissions.  He reports being at an emergency department about 5 months ago for an acute flare of monoarticular arthritis at the right first MTP.  Reports recently having the Covid vaccine about a month ago.  __________________________________________________________  Problem List: Patient Active Problem List   Diagnosis Date Noted  . PAD (peripheral artery disease) ([place]) 01/29/2018    Priority: High  . Type 2 diabetes mellitus with diabetic neuropathy ([place]) 12/17/2001    Priority: High  . Atherosclerosis of aorta ([place]) 03/30/2015    Priority: Medium  . Hyperlipidemia associated with type 2 diabetes mellitus ([place]) 12/22/2014    Priority: Medium  . Diabetic polyradiculopathy associated with type 2 diabetes mellitus ([place]) 04/30/2012    Priority: Medium  . Gout 07/29/2018    Priority: Low  . Erectile dysfunction associated with type 2 diabetes mellitus ([place]) 03/01/2016    Priority: Low  . Preventative health care 12/08/2012    Priority: Low  . GERD (gastroesophageal reflux disease) 07/08/2012    Priority: Low    Medications: Reconciled today in Epic __________________________________________________________  Physical Exam:  Vital Signs: Vitals:    09/26/20 1030  BP: 132/75  Pulse: 100  Temp: 97.6 F (36.4 C)  TempSrc: Oral  SpO2: 100%  Weight: 181 lb (82.1 kg)  Height: 5\' 9"  (1.753 m)    Gen: Well appearing, NAD Neck: No cervical LAD, No thyromegaly or nodules CV: RRR, no murmurs Pulm: Normal effort, CTA throughout, no wheezing Ext: Warm, no edema, normal joints, both shoulders have good range of motion, no impingement, normal internal and external rotation, some pain on the left with empty can test.

## 2020-09-26 NOTE — Assessment & Plan Note (Addendum)
Symptomatically stable on omeprazole, I removed the pantoprazole from his medication list and his medication bag.

## 2020-09-26 NOTE — Assessment & Plan Note (Signed)
Mild amount of pain in both shoulders, right worse than left.  Not function limiting.  Exam is reassuring, no effusions, no signs of osteoarthritis.  No impingement syndrome right now, no signs of rotator cuff tear.  Encouraged conservative managements.  Talked about potential benefit of subacromial steroid injection to improve function in the future, but would want to see better control of his diabetes first.

## 2020-09-26 NOTE — Assessment & Plan Note (Signed)
Hemoglobin A1c shows poor control at 8.8%.  Lots of confusion about his medications, not good consistency with taking the medications.  He has diabetes related complications including diabetic neuropathy.  Fasting sugar this morning very high at 366.  At this point I am going to discontinue pioglitazone, patient is not taking it anyways.  Will discontinue Victoza, start Xultophy 16 units daily.  He already has pen needles and supplies for checking his blood sugars.  I will have our diabetes educator call him in 1-2 weeks to see how he did with the transition.  Really stressed the importance of consistency with medications so that we can make adjustments to get his diabetes under control.  Intolerant to Metformin.

## 2020-09-26 NOTE — Assessment & Plan Note (Signed)
Symptomatically stable.  No function limiting claudication.  I refilled Plavix, stressed the importance of consistency.  Continue with atorvastatin 80 mg daily.

## 2020-09-26 NOTE — Assessment & Plan Note (Signed)
Symptomatically stable.  I refilled Lyrica 100 mg to be used daily.

## 2020-09-29 DIAGNOSIS — R69 Illness, unspecified: Secondary | ICD-10-CM | POA: Diagnosis not present

## 2020-10-11 ENCOUNTER — Telehealth: Payer: Self-pay | Admitting: Dietician

## 2020-10-11 DIAGNOSIS — E1140 Type 2 diabetes mellitus with diabetic neuropathy, unspecified: Secondary | ICD-10-CM

## 2020-10-12 MED ORDER — PEN NEEDLES 32G X 4 MM MISC: 3 refills | Status: DC

## 2020-10-12 MED ORDER — ACCU-CHEK SOFTCLIX LANCETS MISC: 5 refills | Status: DC

## 2020-10-12 MED ORDER — ACCU-CHEK GUIDE W/DEVICE KIT: PACK | 1 refills | Status: DC

## 2020-10-12 MED ORDER — ACCU-CHEK GUIDE VI STRP: ORAL_STRIP | 5 refills | Status: DC

## 2020-10-12 NOTE — Telephone Encounter (Signed)
Called Mr Vollmer per request of Dr. Evette Doffing to follow up on how he was doing on xultophy. Mr. Kelleher says he only had money to pick up half of what was at his pharmacy and plans to pick up the other half tomorrow which should include the Xultophy. He also requests a meter, strips lancets and pen needles also be sent to [facility] in [place] , [place].  He states he has continued to take the Victoza and does not know what his blood sugar is because he has not been checking because he does not think his meter is accurate. I informed him that his insurance may not pay for another meter. We agreed to a follow up call in 1 week. He asks that other [facility] be taken off his preferred pharmacy list.

## 2020-10-28 ENCOUNTER — Encounter: Payer: Self-pay | Admitting: Dietician

## 2020-10-28 NOTE — Telephone Encounter (Addendum)
Unable to reach patient by phone or leave a message.plan to try again later and also send a letter asking him to call the office.

## 2020-11-09 DIAGNOSIS — S40011A Contusion of right shoulder, initial encounter: Secondary | ICD-10-CM | POA: Diagnosis not present

## 2020-11-09 DIAGNOSIS — E119 Type 2 diabetes mellitus without complications: Secondary | ICD-10-CM | POA: Diagnosis not present

## 2020-11-09 DIAGNOSIS — S161XXA Strain of muscle, fascia and tendon at neck level, initial encounter: Secondary | ICD-10-CM | POA: Diagnosis not present

## 2020-11-09 DIAGNOSIS — M50323 Other cervical disc degeneration at C6-C7 level: Secondary | ICD-10-CM | POA: Diagnosis not present

## 2020-11-09 DIAGNOSIS — Z720 Tobacco use: Secondary | ICD-10-CM | POA: Diagnosis not present

## 2020-11-09 DIAGNOSIS — I10 Essential (primary) hypertension: Secondary | ICD-10-CM | POA: Diagnosis not present

## 2020-11-09 DIAGNOSIS — Z794 Long term (current) use of insulin: Secondary | ICD-10-CM | POA: Diagnosis not present

## 2020-11-09 DIAGNOSIS — Z888 Allergy status to other drugs, medicaments and biological substances status: Secondary | ICD-10-CM | POA: Diagnosis not present

## 2021-01-25 ENCOUNTER — Ambulatory Visit (HOSPITAL_COMMUNITY)
Admission: RE | Admit: 2021-01-25 | Payer: Medicare HMO | Source: Ambulatory Visit | Attending: Cardiovascular Disease | Admitting: Cardiovascular Disease

## 2021-04-20 ENCOUNTER — Encounter: Payer: Self-pay | Admitting: *Deleted

## 2021-04-20 NOTE — Progress Notes (Unsigned)

## 2021-04-21 NOTE — Progress Notes (Unsigned)
Things That May Be Affecting Your Health:  Alcohol  Hearing loss  Pain    Depression  Home Safety  Sexual Health   Diabetes  Lack of physical activity  Stress   Difficulty with daily activities  Loneliness  Tiredness   Drug use  Medicines  Tobacco use   Falls  Motor Vehicle Safety  Weight   Food choices  Oral Health  Other    YOUR PERSONALIZED HEALTH PLAN : 1. Schedule your next subsequent Medicare Wellness visit in one year 2. Attend all of your regular appointments to address your medical issues 3. Complete the preventative screenings and services   Annual Wellness Visit   Medicare Covered Preventative Screenings and [place] Men and Women Who How Often Need? Date of Last Service Action  Abdominal Aortic Aneurysm Adults with AAA risk factors Once      Alcohol Misuse and Counseling All Adults Screening once a year if no alcohol misuse. Counseling up to 4 face to face sessions.     Bone Density Measurement  Adults at risk for osteoporosis Once every 2 yrs      Lipid Panel Z13.6 All adults without CV disease Once every 5 yrs       Colorectal Cancer   Stool sample or  Colonoscopy All adults 104 and older   Once every year  Every 10 years Yes       Depression All Adults Once a year  Today   Diabetes Screening Blood glucose, post glucose load, or GTT Z13.1  All adults at risk  Pre-diabetics  Once per year  Twice per year      Diabetes  Self-Management Training All adults Diabetics 10 hrs first year; 2 hours subsequent years. Requires Copay Yes    Glaucoma  Diabetics  Family history of glaucoma  African Americans 12 yrs +  Hispanic Americans 80 yrs + Annually - requires coppay      Hepatitis C Z72.89 or F19.20  High Risk for HCV  Born between 1945 and 1965  Annually  Once      HIV Z11.4 All adults based on risk  Annually btw ages 61 & 13 regardless of risk  Annually > 65 yrs if at increased risk      Lung Cancer Screening  Asymptomatic adults aged 60-77 with 30 pack yr history and current smoker OR quit within the last 15 yrs Annually Must have counseling and shared decision making documentation before first screen      Medical Nutrition Therapy Adults with   Diabetes  Renal disease  Kidney transplant within past 3 yrs 3 hours first year; 2 hours subsequent years     Obesity and Counseling All adults Screening once a year Counseling if BMI 30 or higher  Today   Tobacco Use Counseling Adults who use tobacco  Up to 8 visits in one year     Vaccines Z23  Hepatitis B  Influenza   Pneumonia  Adults   Once  Once every flu season  Two different vaccines separated by one year     Next Annual Wellness Visit People with Medicare Every year  Today     Services & Screenings Women Who How Often Need  Date of Last Service Action  Mammogram  Z12.31 Women over 63 One baseline ages 33-39. Annually ager 40 yrs+      Pap tests All women Annually if high risk. Every 2 yrs for normal risk women  Screening for cervical cancer with   Pap (Z01.419 nl or Z01.411abnl) &  HPV Z11.51 Women aged 35 to 38 Once every 5 yrs     Screening pelvic and breast exams All women Annually if high risk. Every 2 yrs for normal risk women     Sexually Transmitted Diseases  Chlamydia  Gonorrhea  Syphilis All at risk adults Annually for non pregnant females at increased risk         [place] Men Who How Ofter Need  Date of Last Service Action  Prostate Cancer - DRE & PSA Men over 50 Annually.  DRE might require a copay.        Sexually Transmitted Diseases  Syphilis All at risk adults Annually for men at increased risk      Health Maintenance List Health Maintenance  Topic Date Due  . COLONOSCOPY (Pts 45-55yrs Insurance coverage will need to be confirmed)  Never done  . LIPID PANEL  04/11/2019  . COLON CANCER SCREENING ANNUAL FOBT  04/17/2019  . OPHTHALMOLOGY EXAM  06/09/2020  . FOOT EXAM   10/07/2020  . HEMOGLOBIN A1C  12/27/2020  . URINE MICROALBUMIN  01/27/2021  . COVID-19 Vaccine (3 - Booster for Pfizer series) 03/05/2021  . INFLUENZA VACCINE  07/17/2021  . TETANUS/TDAP  07/21/2022  . PNEUMOCOCCAL POLYSACCHARIDE VACCINE AGE 27-64 HIGH RISK  Completed  . Hepatitis C Screening  Completed  . HIV Screening  Completed  . HPV VACCINES  Aged Out

## 2021-05-22 ENCOUNTER — Encounter: Payer: Medicare HMO | Admitting: Student in an Organized Health Care Education/Training Program

## 2021-05-22 ENCOUNTER — Telehealth: Payer: Self-pay

## 2021-05-22 NOTE — Progress Notes (Deleted)
Subjective:   [name] male   DOB: [birth year] 60 y.o.   MRN: [id number]  HPI: [name] is a 60 y.o. male with a past medical history as below who presents for a routine follow up.  Tendinitis of both shoulders - sxs? Future injection?  Gout - naproxen - frequency?  Diabetes - Xultophy (indulin degludec and liraglutide)    Past Medical History:  Diagnosis Date  . Back pain, chronic   . Diabetes mellitus 2007  . Hepatitis C    [id number]  . History of chronic hepatitis C 05/13/2012   First noted 2013. RF inc tattoos obtained while incarcerated. Harvoni 2016 SVR 2017   . Hypertension goal BP (blood pressure) < 140/80   . Microcytic anemia   . Neuromuscular disorder ([place])   . PAD (peripheral artery disease) ([place])   . Pancreatitis 01/09/2012   Current Outpatient Medications  Medication Sig Dispense Refill  . Accu-Chek Softclix Lancets lancets check blood sugar 2 times a day 100 each 5  . atorvastatin (LIPITOR) 80 MG tablet Take 1 tablet (80 mg total) by mouth daily at 6 PM. 90 tablet 3  . blood glucose meter kit and supplies KIT Use to check glucose twice a day 1 each 0  . Blood Glucose Monitoring Suppl (ACCU-CHEK GUIDE) w/Device KIT Check blood sugar 2 times a day 1 kit 1  . clopidogrel (PLAVIX) 75 MG tablet Take 1 tablet (75 mg total) by mouth daily. 90 tablet 3  . cyclobenzaprine (FLEXERIL) 10 MG tablet Take 1 tablet (10 mg total) by mouth daily as needed for muscle spasms. 30 tablet 2  . fluticasone (FLONASE) 50 MCG/ACT nasal spray USE 2 SPRAYS IN EACH NOSTRIL DAILY 16 g 11  . glucose blood (ACCU-CHEK GUIDE) test strip USE TO CHECK GLUCOSE TWICE DAILY 100 strip 5  . Insulin Degludec-Liraglutide (XULTOPHY) 100-3.6 UNIT-MG/ML SOPN Inject 16 Units into the skin daily. 6 mL 5  . Insulin Pen Needle (PEN NEEDLES) 32G X 4 MM MISC Use to inject liraglutide once a day 100 each 3  . Lancet Devices MISC 1 each by Does not apply route 2 (two) times daily. Please  use to check blood sugar 2 times daily. diag code E11.40. noninsulin dependent 100 each 11  . naproxen (NAPROSYN) 500 MG tablet Take 1 tablet (500 mg total) by mouth 2 (two) times daily as needed for moderate pain. 30 tablet 2  . omeprazole (PRILOSEC) 40 MG capsule Take 1 capsule (40 mg total) by mouth daily. 90 capsule 1  . pregabalin (LYRICA) 100 MG capsule Take one pill in the morning and two pills at bedtime. 90 capsule 2  . tadalafil (CIALIS) 10 MG tablet Take 1 tablet (10 mg total) by mouth as needed for erectile dysfunction. 15 tablet 0   No current facility-administered medications for this visit.   Family History  Problem Relation Age of Onset  . Diabetes Mother   . Hypertension Mother   . Hyperlipidemia Mother   . Diabetes Brother   . Hypertension Brother   . Heart attack Neg Hx   . Sudden death Neg Hx    Social History   Socioeconomic History  . Marital status: Single    Spouse name: Not on file  . Number of children: Not on file  . Years of education: Not on file  . Highest education level: Not on file  Occupational History  . Not on file  Tobacco Use  . Smoking status: Current  Some Day Smoker    Packs/day: 0.10    Years: 30.00    Pack years: 3.00    Types: Cigarettes, Cigars  . Smokeless tobacco: Never Used  . Tobacco comment: 5 cigs per week  Vaping Use  . Vaping Use: Never used  Substance and Sexual Activity  . Alcohol use: Yes    Alcohol/week: 5.0 standard drinks    Types: 5 Cans of beer per week    Comment: Beer.  . Drug use: Yes    Frequency: 1.0 times per week    Types: Marijuana    Comment: rarely  . Sexual activity: Yes  Other Topics Concern  . Not on file  Social History Narrative   Prior incarceration. Tattoos, inc ones obtained in prison.    On disability since 09/2013   Social Determinants of Health   Financial Resource Strain: Not on file  Food Insecurity: Not on file  Transportation Needs: Not on file  Physical Activity: Not on  file  Stress: Not on file  Social Connections: Not on file   Review of Systems: {Review Of Systems:30496} Objective:  Physical Exam: There were no vitals filed for this visit. {Exam, Complete:17964} Assessment & Plan:  1. ***

## 2021-05-29 ENCOUNTER — Emergency Department (HOSPITAL_COMMUNITY): Payer: Medicare HMO

## 2021-05-29 ENCOUNTER — Other Ambulatory Visit: Payer: Self-pay

## 2021-05-29 ENCOUNTER — Emergency Department (HOSPITAL_COMMUNITY)
Admission: EM | Admit: 2021-05-29 | Discharge: 2021-05-29 | Disposition: A | Discharge status: Home / Self Care | Payer: Medicare HMO | Attending: Emergency Medicine | Admitting: Emergency Medicine

## 2021-05-29 DIAGNOSIS — M5459 Other low back pain: Secondary | ICD-10-CM | POA: Diagnosis not present

## 2021-05-29 DIAGNOSIS — N521 Erectile dysfunction due to diseases classified elsewhere: Secondary | ICD-10-CM | POA: Diagnosis not present

## 2021-05-29 DIAGNOSIS — Z794 Long term (current) use of insulin: Secondary | ICD-10-CM | POA: Diagnosis not present

## 2021-05-29 DIAGNOSIS — E1140 Type 2 diabetes mellitus with diabetic neuropathy, unspecified: Secondary | ICD-10-CM | POA: Insufficient documentation

## 2021-05-29 DIAGNOSIS — M545 Low back pain, unspecified: Secondary | ICD-10-CM | POA: Insufficient documentation

## 2021-05-29 DIAGNOSIS — Z79899 Other long term (current) drug therapy: Secondary | ICD-10-CM | POA: Insufficient documentation

## 2021-05-29 DIAGNOSIS — F17210 Nicotine dependence, cigarettes, uncomplicated: Secondary | ICD-10-CM | POA: Diagnosis not present

## 2021-05-29 DIAGNOSIS — G8929 Other chronic pain: Secondary | ICD-10-CM

## 2021-05-29 DIAGNOSIS — E1151 Type 2 diabetes mellitus with diabetic peripheral angiopathy without gangrene: Secondary | ICD-10-CM | POA: Insufficient documentation

## 2021-05-29 DIAGNOSIS — Z7902 Long term (current) use of antithrombotics/antiplatelets: Secondary | ICD-10-CM | POA: Insufficient documentation

## 2021-05-29 DIAGNOSIS — I10 Essential (primary) hypertension: Secondary | ICD-10-CM | POA: Insufficient documentation

## 2021-05-29 DIAGNOSIS — E785 Hyperlipidemia, unspecified: Secondary | ICD-10-CM | POA: Diagnosis not present

## 2021-05-29 DIAGNOSIS — E1169 Type 2 diabetes mellitus with other specified complication: Secondary | ICD-10-CM | POA: Diagnosis not present

## 2021-05-29 DIAGNOSIS — I739 Peripheral vascular disease, unspecified: Secondary | ICD-10-CM | POA: Insufficient documentation

## 2021-05-29 MED ORDER — CLOPIDOGREL BISULFATE 75 MG PO TABS: 75.0000 mg | ORAL_TABLET | Freq: Every day | ORAL | 1 refills | Status: DC

## 2021-05-29 MED ORDER — KETOROLAC TROMETHAMINE 30 MG/ML IJ SOLN
15.0000 mg | Freq: Once | INTRAMUSCULAR | Status: AC
  Administered 2021-05-29: 15 mg via INTRAMUSCULAR
  Filled 2021-05-29: qty 1

## 2021-05-29 MED ORDER — LIDOCAINE 5 % EX PTCH
1.0000 | MEDICATED_PATCH | Freq: Once | CUTANEOUS | Status: DC
  Administered 2021-05-29: 1 via TRANSDERMAL
  Filled 2021-05-29: qty 1

## 2021-05-29 MED ORDER — METHOCARBAMOL 500 MG PO TABS: 500.0000 mg | ORAL_TABLET | Freq: Two times a day (BID) | ORAL | 0 refills | Status: DC

## 2021-05-29 NOTE — ED Provider Notes (Addendum)
Emergency Medicine Provider Triage Evaluation Note  Donald Palmer , a 60 y.o. male  was evaluated in triage.  Pt complains of bilateral back pain.  Pain radiates into bilateral low back.  Patient reports that pain started this weekend.  Patient reports pain is similar previous episodes.  Patient is requesting "shot in his low back."  No recent falls or injuries.  Denies any IV drug use, history of cancer.  Review of Systems  Positive: Low back pain with radiation to bilateral lower legs Negative: Fevers, chills, unexpected weight loss, night sweats, bowel or bladder dysfunction, numbness, weakness, chest pain, shortness of breath, abdominal pain  Physical Exam  There were no vitals taken for this visit. Gen:   Awake, no distress   Resp:  Normal effort  MSK:   Moves extremities without difficulty, no midline tenderness or deformity to cervical, thoracic, or lumbar spine.  Has no tenderness to thoracic or lumbar paraspinal muscles Other:  Able to stand and ambulate without difficulty  Medical Decision Making  Medically screening exam initiated at 7:37 AM.  Appropriate orders placed.  Donald Palmer was informed that the remainder of the evaluation will be completed by another provider, this initial triage assessment does not replace that evaluation, and the importance of remaining in the ED until their evaluation is complete.  The patient appears stable so that the remainder of the work up may be completed by another provider.      Loni Beckwith, PA-C 05/29/21 0740    Loni Beckwith, PA-C 05/29/21 0746    Lorelle Gibbs, DO 05/29/21 [phone]

## 2021-05-29 NOTE — ED Triage Notes (Signed)
Pt with hx of back problems, here for eval of lower back pain with radiation down both legs. Denies injury. Repeatedly saying in triage "I just need that shot."

## 2021-05-29 NOTE — ED Provider Notes (Signed)
[facility] EMERGENCY DEPARTMENT Provider Note   CSN: [id number] Arrival date & time: 05/29/21  2119     History Chief Complaint  Patient presents with   Back Pain    Donald Palmer is a 60 y.o. male.  Donald Palmer is a 60 y.o. male with a history of hypertension, diabetes, PAD, hep C, who presents to the emergency department for evaluation of bilateral low back pain.  Pain has been present over the past 3 days.  He denies any inciting injury or trauma.  He has had many prior episodes of similar pain and reports a chronic disc issue.  He reports when he comes to the hospital they usually give him a shot that helped significantly with the pain.  He reports pain radiates down into his legs but does not have any numbness or weakness.  No difficulty walking.  No loss of bowel or bladder control or saddle anesthesia.  No fevers or chills.  No history of cancer or IV drug use.  The history is provided by the patient.      Past Medical History:  Diagnosis Date   Back pain, chronic    Diabetes mellitus 2007   Hepatitis C    [id number]   History of chronic hepatitis C 05/13/2012   First noted 2013. RF inc tattoos obtained while incarcerated. Harvoni 2016 SVR 2017    Hypertension goal BP (blood pressure) < 140/80    Microcytic anemia    Neuromuscular disorder (HCC)    PAD (peripheral artery disease) ([place])    Pancreatitis 01/09/2012    Patient Active Problem List   Diagnosis Date Noted   Tendinitis of both rotator cuffs 09/26/2020   Gout 07/29/2018   PAD (peripheral artery disease) ([place]) 01/29/2018   Erectile dysfunction associated with type 2 diabetes mellitus ([place]) 03/01/2016   Atherosclerosis of aorta ([place]) 03/30/2015   Hyperlipidemia associated with type 2 diabetes mellitus ([place]) 12/22/2014   Preventative health care 12/08/2012   GERD (gastroesophageal reflux disease) 07/08/2012   Diabetic polyradiculopathy associated with type 2 diabetes mellitus ([place]) 04/30/2012    Type 2 diabetes mellitus with diabetic neuropathy ([place]) 12/17/2001    Past Surgical History:  Procedure Laterality Date   ABDOMINAL AORTOGRAM N/A 03/03/2018   Procedure: ABDOMINAL AORTOGRAM;  Surgeon: Lorretta Harp, MD;  Location: [place] [facility];  Service: Cardiovascular;  Laterality: N/A;   ABDOMINAL AORTOGRAM W/LOWER EXTREMITY N/A 06/15/2019   Procedure: ABDOMINAL AORTOGRAM W/LOWER EXTREMITY;  Surgeon: Lorretta Harp, MD;  Location: [place] [facility];  Service: Cardiovascular;  Laterality: N/A;   I & D EXTREMITY Right 05/15/2016   Procedure: RIGHT INDEX FINGER FIRST THROUGH  MIDDLE PHALYNX  AMPUTATION;  Surgeon: [name], MD;  Location: [place];  Service: Orthopedics;  Laterality: Right;   LOWER EXTREMITY INTERVENTION Bilateral 03/03/2018   Procedure: LOWER EXTREMITY INTERVENTION;  Surgeon: Lorretta Harp, MD;  Location: [place] [facility];  Service: Cardiovascular;  Laterality: Bilateral;   OPEN REDUCTION INTERNAL FIXATION (ORIF) DISTAL PHALANX Right 04/02/2016   Procedure: RIGHT INDEX FINGER REPAIR;  Surgeon: [name], MD;  Location: [place];  Service: Orthopedics;  Laterality: Right;   PERIPHERAL VASCULAR ATHERECTOMY Left 03/03/2018   Procedure: PERIPHERAL VASCULAR ATHERECTOMY;  Surgeon: Lorretta Harp, MD;  Location: [place] [facility];  Service: Cardiovascular;  Laterality: Left;  SFA WITH PTA DRUG COATED BALLOON   PERIPHERAL VASCULAR INTERVENTION Left 06/15/2019   Procedure: PERIPHERAL VASCULAR INTERVENTION;  Surgeon: Quay Burow  J, MD;  Location: [place] [facility];  Service: Cardiovascular;  Laterality: Left;       Family History  Problem Relation Age of Onset   Diabetes Mother    Hypertension Mother    Hyperlipidemia Mother    Diabetes Brother    Hypertension Brother    Heart attack Neg Hx    Sudden death Neg Hx     Social History   Tobacco Use   Smoking status: Some Days    Packs/day: 0.10    Years: 30.00    Pack years:  3.00    Types: Cigarettes, Cigars   Smokeless tobacco: Never   Tobacco comments:    5 cigs per week  Vaping Use   Vaping Use: Never used  Substance Use Topics   Alcohol use: Yes    Alcohol/week: 5.0 standard drinks    Types: 5 Cans of beer per week    Comment: Beer.   Drug use: Yes    Frequency: 1.0 times per week    Types: Marijuana    Comment: rarely    Home Medications Prior to Admission medications   Medication Sig Start Date End Date Taking? Authorizing Provider  Accu-Chek Softclix Lancets lancets check blood sugar 2 times a day 10/12/20   Axel Filler, MD  atorvastatin (LIPITOR) 80 MG tablet Take 1 tablet (80 mg total) by mouth daily at 6 PM. 09/26/20   Axel Filler, MD  blood glucose meter kit and supplies KIT Use to check glucose twice a day 10/08/19   [name], MD  Blood Glucose Monitoring Suppl (ACCU-CHEK GUIDE) w/Device KIT Check blood sugar 2 times a day 10/12/20   Axel Filler, MD  clopidogrel (PLAVIX) 75 MG tablet Take 1 tablet (75 mg total) by mouth daily. 09/26/20   Axel Filler, MD  cyclobenzaprine (FLEXERIL) 10 MG tablet Take 1 tablet (10 mg total) by mouth daily as needed for muscle spasms. 09/26/20   Axel Filler, MD  fluticasone [facility]) 50 MCG/ACT nasal spray USE 2 SPRAYS IN [facility] NOSTRIL DAILY 02/18/20   [name], MD  glucose blood (ACCU-CHEK GUIDE) test strip USE TO CHECK GLUCOSE TWICE DAILY 10/12/20   Axel Filler, MD  Insulin Degludec-Liraglutide (XULTOPHY) 100-3.6 UNIT-MG/ML SOPN Inject 16 Units into the skin daily. 09/26/20   Axel Filler, MD  Insulin Pen Needle (PEN NEEDLES) 32G X 4 MM MISC Use to inject liraglutide once a day 10/12/20   Axel Filler, MD  Lancet Devices MISC 1 each by Does not apply route 2 (two) times daily. Please use to check blood sugar 2 times daily. diag code E11.40. noninsulin dependent 09/01/15   [name], MD  naproxen  (NAPROSYN) 500 MG tablet Take 1 tablet (500 mg total) by mouth 2 (two) times daily as needed for moderate pain. 09/26/20   Axel Filler, MD  omeprazole (PRILOSEC) 40 MG capsule Take 1 capsule (40 mg total) by mouth daily. 09/26/20   Axel Filler, MD  pregabalin (LYRICA) 100 MG capsule Take one pill in the morning and two pills at bedtime. 09/26/20   Axel Filler, MD  tadalafil (CIALIS) 10 MG tablet Take 1 tablet (10 mg total) by mouth as needed for erectile dysfunction. 04/28/20 04/28/21  [name], MD    Allergies    Metformin and related  Review of Systems   Review of Systems  Constitutional:  Negative for chills and fever.  HENT: Negative.  Respiratory:  Negative for shortness of breath.   Cardiovascular:  Negative for chest pain.  Gastrointestinal:  Negative for abdominal pain, constipation, diarrhea, nausea and vomiting.  Genitourinary:  Negative for dysuria, flank pain, frequency and hematuria.  Musculoskeletal:  Positive for back pain. Negative for arthralgias, gait problem, joint swelling, myalgias and neck pain.  Skin:  Negative for color change, rash and wound.  Neurological:  Negative for weakness and numbness.  All other systems reviewed and are negative.  Physical Exam Updated Vital Signs BP (!) 149/111 (BP Location: Right Arm)   Pulse 91   Temp 97.7 F (36.5 C) (Oral)   Resp 16   SpO2 100%   Physical Exam Vitals and nursing note reviewed.  Constitutional:      General: He is not in acute distress.    Appearance: Normal appearance. He is well-developed. He is not ill-appearing or diaphoretic.  HENT:     Head: Atraumatic.  Eyes:     General:        Right eye: No discharge.        Left eye: No discharge.  Cardiovascular:     Pulses:          Radial pulses are 2+ on the right side and 2+ on the left side.       Dorsalis pedis pulses are 2+ on the right side and 2+ on the left side.       Posterior tibial pulses are 2+ on  the right side and 2+ on the left side.  Pulmonary:     Effort: Pulmonary effort is normal. No respiratory distress.  Abdominal:     General: Bowel sounds are normal. There is no distension.     Palpations: Abdomen is soft. There is no mass.     Tenderness: There is no abdominal tenderness. There is no guarding.     Comments: Abdomen soft, nondistended, nontender to palpation in all quadrants without guarding or peritoneal signs, no CVA tenderness bilaterally  Musculoskeletal:     Cervical back: Neck supple.     Comments: Tenderness to palpation across the low back, no palpable step-off or deformity.  Pain made worse with range of motion of the lower extremities  Skin:    General: Skin is warm and dry.     Capillary Refill: Capillary refill takes less than 2 seconds.  Neurological:     Mental Status: He is alert and oriented to person, place, and time.     Comments: Alert, clear speech, following commands. Moving all extremities without difficulty. Bilateral lower extremities with 5/5 strength in proximal and distal muscle groups and with dorsi and plantar flexion. Sensation intact in bilateral lower extremities. 2+ patellar DTRs bilaterally. Ambulatory with steady gait  Psychiatric:        Behavior: Behavior normal.    ED Results / Procedures / Treatments   Labs (all labs ordered are listed, but only abnormal results are displayed) Labs Reviewed - No data to display  EKG None  Radiology DG Lumbar Spine Complete  Result Date: 05/29/2021 CLINICAL DATA:  Low back pain EXAM: LUMBAR SPINE - COMPLETE 4+ VIEW COMPARISON:  06/12/2017 FINDINGS: There is no evidence of lumbar spine fracture. Alignment is normal. Intervertebral disc spaces are maintained. Atherosclerotic calcification abdominal aorta. IMPRESSION: Negative. Aortic atherosclerosis. Electronically Signed   By: Franchot Gallo M.D.   On: 05/29/2021 08:24    Procedures Procedures   Medications Ordered in ED Medications   ketorolac (TORADOL) 30 MG/ML injection 15 mg (  has no administration in time range)  lidocaine (LIDODERM) 5 % 1 patch (has no administration in time range)    ED Course  I have reviewed the triage vital signs and the nursing notes.  Pertinent labs & imaging results that were available during my care of the patient were reviewed by me and considered in my medical decision making (see chart for details).    MDM Rules/Calculators/A&P                          Patient presents with complaint of back pain.  Patient is nontoxic appearing, vitals are WNL. Patient has normal neurologic exam, no point/focal midline tenderness to palpation. Ambulatory in the ED.  No back pain red flags.  Does have history of peripheral vascular disease, but has palpable and equal pulses in bilateral lower extremities, warm to the touch with normal coloration, low suspicion for PAD contributing to symptoms today.  No urinary sxs. Most likely muscle strain versus spasm. Considered UTI/pyelonephritis, kidney stone, aortic aneurysm/dissection, cauda equina or epidural abscess however these do not feel these diagnoses fit clinical picture at this time.  We will treat supportively with medications at home and have patient follow closely with PCP.  I discussed treatment plan, need for PCP follow-up, and return precautions with the patient. Provided opportunity for questions, patient confirmed understanding and is in agreement with plan.   Final Clinical Impression(s) / ED Diagnoses Final diagnoses:  Acute exacerbation of chronic low back pain  PAD (peripheral artery disease) ([place])    Rx / DC Orders ED Discharge Orders          Ordered    methocarbamol (ROBAXIN) 500 MG tablet  2 times daily        05/29/21 1001    clopidogrel (PLAVIX) 75 MG tablet  Daily        05/29/21 1001             Benedetto Goad [place], [place] 05/29/21 1003    Daleen Bo, MD 05/29/21 1810

## 2021-05-29 NOTE — Discharge Instructions (Addendum)
You were seen here today for Back Pain: Low back pain is discomfort in the lower back that may be due to injuries to muscles and ligaments around the spine. Occasionally, it may be caused by a problem to a part of the spine called a disc. Your back pain should be treated with medicines listed below as well as back exercises and this back pain should get better over the next 2 weeks. Most patients get completely well in 4 weeks. It is important to know however, if you develop severe or worsening pain, low back pain with fever, numbness, weakness or inability to walk or urinate, you should return to the ER immediately.  Please follow up with your doctor this week for a recheck if still having symptoms.  HOME INSTRUCTIONS Self - care:  The application of heat can help soothe the pain.  Maintaining your daily activities, including walking (this is encouraged), as it will help you get better faster than just staying in bed. Do not life, push, pull anything more than 10 pounds for the next week. I am attaching back exercises that you can do at home to help facilitate your recovery.   Back Exercises - I have attached a handout on back exercises that can be done at home to help facilitate your recovery.   Medications are also useful to help with pain control.   Acetaminophen.  This medication is generally safe, and found over the counter. Take as directed for your age. You should not take more than 8 of the extra strength (500mg ) pills a day (max dose is 4000mg  total OVER one day)  Non steroidal anti inflammatory: This includes medications including Ibuprofen, naproxen and Mobic; These medications help both pain and swelling and are very useful in treating back pain.  They should be taken with food, as they can cause stomach upset, and more seriously, stomach bleeding. Do not combine the medications. If you can not take medications such as ibuprofen and naproxen, medications such as Diclofenac (Voltaren) gel can  be applied directly to the area of pain.   Lidocaine Patch: Salon Pas lidocaine patches (blue and silver box) can be purchased over the counter and worn for 12 hours for local pain relief    Muscle relaxants:  These medications can help with muscle tightness that is a cause of lower back pain.  Most of these medications can cause drowsiness, and it is not safe to drive or use dangerous machinery while taking them. They are primarily helpful when taken at night before sleep.   You will need to follow up with your primary healthcare provider or the Orthopedist in 1-2 weeks for reassessment and persistent symptoms.  Be aware that if you develop new symptoms, such as a fever, leg weakness, difficulty with or loss of control of your urine or bowels, abdominal pain, or more severe pain, you will need to seek medical attention and/or return to the Emergency department. Additional Information:  Your vital signs today were: BP (!) 149/111 (BP Location: Right Arm)   Pulse 91   Temp 97.7 F (36.5 C) (Oral)   Resp 16   SpO2 100%  If your blood pressure (BP) was elevated above 135/85 this visit, please have this repeated by your doctor within one month. ---------------

## 2021-05-30 ENCOUNTER — Encounter: Payer: Medicare HMO | Admitting: Internal Medicine

## 2021-06-05 ENCOUNTER — Encounter: Payer: Self-pay | Admitting: Internal Medicine

## 2021-06-05 ENCOUNTER — Other Ambulatory Visit: Payer: Self-pay

## 2021-06-05 ENCOUNTER — Ambulatory Visit (INDEPENDENT_AMBULATORY_CARE_PROVIDER_SITE_OTHER): Payer: Medicare HMO | Admitting: Internal Medicine

## 2021-06-05 VITALS — BP 121/74 | HR 76 | Temp 97.8°F | Ht 70.0 in | Wt 178.4 lb

## 2021-06-05 DIAGNOSIS — N521 Erectile dysfunction due to diseases classified elsewhere: Secondary | ICD-10-CM

## 2021-06-05 DIAGNOSIS — J302 Other seasonal allergic rhinitis: Secondary | ICD-10-CM

## 2021-06-05 DIAGNOSIS — E1169 Type 2 diabetes mellitus with other specified complication: Secondary | ICD-10-CM | POA: Diagnosis not present

## 2021-06-05 DIAGNOSIS — E1140 Type 2 diabetes mellitus with diabetic neuropathy, unspecified: Secondary | ICD-10-CM

## 2021-06-05 DIAGNOSIS — E785 Hyperlipidemia, unspecified: Secondary | ICD-10-CM | POA: Diagnosis not present

## 2021-06-05 DIAGNOSIS — I739 Peripheral vascular disease, unspecified: Secondary | ICD-10-CM | POA: Diagnosis not present

## 2021-06-05 DIAGNOSIS — M109 Gout, unspecified: Secondary | ICD-10-CM | POA: Diagnosis not present

## 2021-06-05 LAB — POCT GLYCOSYLATED HEMOGLOBIN (HGB A1C): Hemoglobin A1C: 11.1 % — AB (ref 4.0–5.6)

## 2021-06-05 LAB — GLUCOSE, CAPILLARY: Glucose-Capillary: 222 mg/dL — ABNORMAL HIGH (ref 70–99)

## 2021-06-05 MED ORDER — ACCU-CHEK GUIDE VI STRP
ORAL_STRIP | 5 refills | Status: DC
Start: 2021-06-05 — End: 2022-08-29

## 2021-06-05 MED ORDER — CYCLOBENZAPRINE HCL 10 MG PO TABS: 10.0000 mg | ORAL_TABLET | Freq: Every day | ORAL | 2 refills | Status: DC | PRN

## 2021-06-05 MED ORDER — TADALAFIL 10 MG PO TABS: 10.0000 mg | ORAL_TABLET | ORAL | 0 refills | Status: DC | PRN

## 2021-06-05 MED ORDER — ATORVASTATIN CALCIUM 80 MG PO TABS: 80.0000 mg | ORAL_TABLET | Freq: Every day | ORAL | 3 refills | Status: DC

## 2021-06-05 MED ORDER — ACCU-CHEK SOFTCLIX LANCETS MISC: 5 refills | Status: DC

## 2021-06-05 MED ORDER — NAPROXEN 500 MG PO TABS
500.0000 mg | ORAL_TABLET | Freq: Two times a day (BID) | ORAL | 2 refills | Status: DC | PRN
Start: 2021-06-05 — End: 2021-10-16

## 2021-06-05 MED ORDER — ACCU-CHEK GUIDE W/DEVICE KIT: PACK | 1 refills | Status: AC

## 2021-06-05 MED ORDER — PEN NEEDLES 32G X 4 MM MISC: 3 refills | Status: DC

## 2021-06-05 MED ORDER — FLUTICASONE PROPIONATE 50 MCG/ACT NA SUSP: NASAL | 11 refills | Status: DC

## 2021-06-05 MED ORDER — XULTOPHY 100-3.6 UNIT-MG/ML ~~LOC~~ SOPN: 16.0000 [IU] | PEN_INJECTOR | Freq: Every day | SUBCUTANEOUS | 5 refills | Status: DC

## 2021-06-05 MED ORDER — PREGABALIN 100 MG PO CAPS: ORAL_CAPSULE | ORAL | 2 refills | Status: DC

## 2021-06-05 MED ORDER — CLOPIDOGREL BISULFATE 75 MG PO TABS: 75.0000 mg | ORAL_TABLET | Freq: Every day | ORAL | 1 refills | Status: DC

## 2021-06-05 MED ORDER — LANCET DEVICES MISC: 1.0000 | Freq: Two times a day (BID) | 11 refills | Status: DC

## 2021-06-05 NOTE — Assessment & Plan Note (Addendum)
DIABETES TYPE 2 FOLLOW-UP: Anti-hyperglycemic agents: Xultophy 16U daily Secondary agents: lipitor 80mg  daily Last A1C: 8.8 A1C today 11.1  Med Adherence:  []  Yes    [x]  No Numbness of the feet? []  Yes    [x]  No Retinopathy hx? []  Yes    [x]  No Last eye exam: 05/2019 Foot exam performed in the office today  Assessment: uncontrolled type 2 diabetes mellitus in the setting of medication non-adherence and poor follow up with provider. He has not been taking medication for the past 6 months or so Discussed the importance of adherence to medications today. He reported that he stopped taking it as he felt that "the good lord is taking care of me" and reports that he has been feeling great. Reviewed long term complications of poor glycemic control.  He did not tolerate metformin in the past.  Plan:  -will start by just having him resume Xutophy -check urine microalbumin creatinine ratio -referral placed to our clinical pharmacist for assistance in medication adherence, education -continue lyrica -he will follow up with his PCP in 3 months  -encouraged retinal screening at his next eye visit

## 2021-06-05 NOTE — Progress Notes (Signed)
Office Visit   [name], male    DOB: 06/12/1961, 60 y.o.   MRN: [id number]   PCP: Axel Filler, MD   Subjective:   Donald Palmer is a 60 y.o. year old male who presents for follow up of diabetes, PAD. Please refer to problem based charting for assessment and plan.      ACTIVE MEDICATIONS   Outpatient Medications Prior to Visit  Medication Sig Dispense Refill   blood glucose meter kit and supplies KIT Use to check glucose twice a day 1 each 0   methocarbamol (ROBAXIN) 500 MG tablet Take 1 tablet (500 mg total) by mouth 2 (two) times daily. 20 tablet 0   Accu-Chek Softclix Lancets lancets check blood sugar 2 times a day 100 each 5   atorvastatin (LIPITOR) 80 MG tablet Take 1 tablet (80 mg total) by mouth daily at 6 PM. 90 tablet 3   Blood Glucose Monitoring Suppl (ACCU-CHEK GUIDE) w/Device KIT Check blood sugar 2 times a day 1 kit 1   clopidogrel (PLAVIX) 75 MG tablet Take 1 tablet (75 mg total) by mouth daily. 30 tablet 1   cyclobenzaprine (FLEXERIL) 10 MG tablet Take 1 tablet (10 mg total) by mouth daily as needed for muscle spasms. 30 tablet 2   fluticasone (FLONASE) 50 MCG/ACT nasal spray USE 2 SPRAYS IN EACH NOSTRIL DAILY 16 g 11   glucose blood (ACCU-CHEK GUIDE) test strip USE TO CHECK GLUCOSE TWICE DAILY 100 strip 5   Insulin Degludec-Liraglutide (XULTOPHY) 100-3.6 UNIT-MG/ML SOPN Inject 16 Units into the skin daily. 6 mL 5   Insulin Pen Needle (PEN NEEDLES) 32G X 4 MM MISC Use to inject liraglutide once a day 100 each 3   Lancet Devices MISC 1 each by Does not apply route 2 (two) times daily. Please use to check blood sugar 2 times daily. diag code E11.40. noninsulin dependent 100 each 11   naproxen (NAPROSYN) 500 MG tablet Take 1 tablet (500 mg total) by mouth 2 (two) times daily as needed for moderate pain. 30 tablet 2   omeprazole (PRILOSEC) 40 MG capsule Take 1 capsule (40 mg total) by mouth daily. 90 capsule 1   pregabalin (LYRICA) 100 MG capsule Take  one pill in the morning and two pills at bedtime. 90 capsule 2   tadalafil (CIALIS) 10 MG tablet Take 1 tablet (10 mg total) by mouth as needed for erectile dysfunction. 15 tablet 0   No facility-administered medications prior to visit.     Objective:   BP 121/74 (BP Location: Right Arm, Patient Position: Sitting, Cuff Size: Small)   Pulse 76   Temp 97.8 F (36.6 C) (Oral)   Ht 5' 10"  (1.778 m)   Wt 178 lb 6.4 oz (80.9 kg)   SpO2 100%   BMI 25.60 kg/m  Wt Readings from Last 3 Encounters:  06/05/21 178 lb 6.4 oz (80.9 kg)  05/29/21 185 lb (83.9 kg)  09/26/20 181 lb (82.1 kg)   BP Readings from Last 3 Encounters:  06/05/21 121/74  05/29/21 (!) 152/99  09/26/20 132/75   Physical Exam Cardiac: RRR, no LE edema Pulm: lungs clear Skin: foot exam performed today; see detailed charting  Health Maintenance:   Health Maintenance  Topic Date Due   Pneumococcal Vaccine 6-71 Years old (1 - PCV) Never done   Zoster Vaccines- Shingrix (1 of 2) Never done   COLONOSCOPY (Pts 45-21yr Insurance coverage will need to be confirmed)  Never done  LIPID PANEL  04/11/2019   COLON CANCER SCREENING ANNUAL FOBT  04/17/2019   OPHTHALMOLOGY EXAM  06/09/2020   FOOT EXAM  10/07/2020   URINE MICROALBUMIN  01/27/2021   COVID-19 Vaccine (3 - Booster for Pfizer series) 02/05/2021   INFLUENZA VACCINE  07/17/2021   HEMOGLOBIN A1C  09/05/2021   TETANUS/TDAP  07/21/2022   PNEUMOCOCCAL POLYSACCHARIDE VACCINE AGE 14-64 HIGH RISK  Completed   Hepatitis C Screening  Completed   HIV Screening  Completed   HPV VACCINES  Aged Out     Assessment & Plan:   Problem List Items Addressed This Visit       Cardiovascular and Mediastinum   PAD (peripheral artery disease) ([place]) (Chronic)    He is prescribed aspirin and plavix however has not been taking it. Discussed the importance of consistent use.  -plavix refilled and sent to pharmacy       Relevant Medications   clopidogrel (PLAVIX) 75 MG tablet    atorvastatin (LIPITOR) 80 MG tablet   tadalafil (CIALIS) 10 MG tablet     Endocrine   Type 2 diabetes mellitus with diabetic neuropathy (HCC) - Primary (Chronic)     DIABETES TYPE 2 FOLLOW-UP: Anti-hyperglycemic agents: Xultophy 16U daily Secondary agents: lipitor 3m daily Last A1C: 8.8 A1C today 11.1  Med Adherence:  []  Yes    [x]  No Numbness of the feet? []  Yes    [x]  No Retinopathy hx? []  Yes    [x]  No Last eye exam: 05/2019 Foot exam performed in the office today  Assessment: uncontrolled type 2 diabetes mellitus in the setting of medication non-adherence and poor follow up with provider. He has not been taking medication for the past 6 months or so Discussed the importance of adherence to medications today. He reported that he stopped taking it as he felt that "the good lord is taking care of me" and reports that he has been feeling great. Reviewed long term complications of poor glycemic control.  He did not tolerate metformin in the past.  Plan:  -will start by just having him resume Xutophy -check urine microalbumin creatinine ratio -referral placed to our clinical pharmacist for assistance in medication adherence, education -continue lyrica -he will follow up with his PCP in 3 months  -encouraged retinal screening at his next eye visit         Relevant Medications   pregabalin (LYRICA) 100 MG capsule   Insulin Degludec-Liraglutide (XULTOPHY) 100-3.6 UNIT-MG/ML SOPN   Insulin Pen Needle (PEN NEEDLES) 32G X 4 MM MISC   glucose blood (ACCU-CHEK GUIDE) test strip   Blood Glucose Monitoring Suppl (ACCU-CHEK GUIDE) w/Device KIT   atorvastatin (LIPITOR) 80 MG tablet   Accu-Chek Softclix Lancets lancets   Other Relevant Orders   POC Hbg A1C (Completed)   Glucose, capillary (Completed)   Microalbumin / Creatinine Urine Ratio   Basic metabolic panel   Amb Referral to Clinical Pharmacist   Hyperlipidemia associated with type 2 diabetes mellitus ([place] (Chronic)     Encouraged him to resume lipitor -lipitor 870mrefill sent to pharmacy       Relevant Medications   Insulin Degludec-Liraglutide (XULTOPHY) 100-3.6 UNIT-MG/ML SOPN   atorvastatin (LIPITOR) 80 MG tablet   Erectile dysfunction associated with type 2 diabetes mellitus (HCC) (Chronic)    Provided cialis refill at pt's request. Symptoms are persistent and likely attributable to uncontrolled diabetes.       Relevant Medications   Insulin Degludec-Liraglutide (XULTOPHY) 100-3.6 UNIT-MG/ML SOPN   atorvastatin (LIPITOR) 80  MG tablet     Other   Gout (Chronic)   Relevant Medications   naproxen (NAPROSYN) 500 MG tablet   Other Visit Diagnoses     Seasonal allergies       Relevant Medications   fluticasone (FLONASE) 50 MCG/ACT nasal spray       He will follow up with Dr. Evette Doffing in 3 months   Pt discussed with Dr. Marty Heck, MD Internal Medicine Resident PGY-2 [facility] Pager: [phone] 06/05/2021 1:17 PM

## 2021-06-05 NOTE — Assessment & Plan Note (Signed)
He is prescribed aspirin and plavix however has not been taking it. Discussed the importance of consistent use.  -plavix refilled and sent to pharmacy

## 2021-06-05 NOTE — Assessment & Plan Note (Signed)
Encouraged him to resume lipitor -lipitor 80mg  refill sent to pharmacy

## 2021-06-05 NOTE — Patient Instructions (Signed)
I would strongly encourage you to restart the Xultophy. Long term uncontrolled diabetes can lead to many complications. Ideally, our A1C goal should be under 7. Yours was 11

## 2021-06-05 NOTE — Assessment & Plan Note (Signed)
Provided cialis refill at pt's request. Symptoms are persistent and likely attributable to uncontrolled diabetes.

## 2021-06-06 ENCOUNTER — Telehealth: Payer: Self-pay | Admitting: *Deleted

## 2021-06-06 LAB — BASIC METABOLIC PANEL
BUN/Creatinine Ratio: 13 (ref 10–24)
BUN: 14 mg/dL (ref 8–27)
CO2: 23 mmol/L (ref 20–29)
Calcium: 9.8 mg/dL (ref 8.6–10.2)
Chloride: 97 mmol/L (ref 96–106)
Creatinine, Ser: 1.05 mg/dL (ref 0.76–1.27)
Glucose: 218 mg/dL — ABNORMAL HIGH (ref 65–99)
Potassium: 4.5 mmol/L (ref 3.5–5.2)
Sodium: 138 mmol/L (ref 134–144)
eGFR: 81 mL/min/{1.73_m2} (ref >59)

## 2021-06-06 LAB — MICROALBUMIN / CREATININE URINE RATIO
Creatinine, Urine: 164.7 mg/dL
Microalb/Creat Ratio: 4 mg/g creat (ref 0–29)
Microalbumin, Urine: 7.3 ug/mL

## 2021-06-06 NOTE — Telephone Encounter (Signed)
Information was sent to [facility] [facility] [employer] through CoverMyMeds for [place] for Tadalafil 10 mg tablets.  Awaiting determination within 3-7 days.  [phone].  Sander Nephew, RN 06/06/2021 11:00 AM.

## 2021-06-15 NOTE — Progress Notes (Signed)
Internal Medicine Clinic Attending  Case discussed with Dr. Christian  At the time of the visit.  We reviewed the resident's history and exam and pertinent patient test results.  I agree with the assessment, diagnosis, and plan of care documented in the resident's note.  

## 2021-06-21 ENCOUNTER — Encounter: Payer: Self-pay | Admitting: Student in an Organized Health Care Education/Training Program

## 2021-06-30 ENCOUNTER — Encounter: Payer: Medicare HMO | Admitting: Pharmacist

## 2021-06-30 NOTE — Progress Notes (Deleted)
Subjective:    [name], male    DOB: [birth year], 60 y.o.   MRN: [id number]  HPI Patient is a 60 y.o. male who presents for diabetes management. He is in *** spirits and presents {w-w/o:315700} assistance. Patient was referred and last seen by provider, Dr. Darrick Meigs, on 06/05/21.  PMH significant for ***.   Patient reports diabetes was diagnosed in ***.   Insurance coverage/medication affordability: ***  Family/Social history: ***  Current diabetes medications include: *** Current hypertension medications include: *** Current hyperlipidemia medications include: *** Patient states that {He/she (caps):30048} {Is/is not:9024} taking {his/her/their:21314} medications as prescribed. Patient {Actions; denies-reports:120008} adherence with medications. Patient states that {He/she (caps):30048} misses {his/her/their:21314} medications *** times per week, on average.  Do you feel that your medications are working for you?  {YES NO:22349}  Have you been experiencing any side effects to the medications prescribed? {YES NO:22349}  Do you have any problems obtaining medications due to transportation or finances?  {YES [id number]     Patient reported dietary habits:  Eats *** meals/day and *** snacks/day; [place] with *** meals/day and *** snacks/day Breakfast:*** Lunch:*** Dinner:*** Snacks:*** Drinks:***  Patient-reported exercise habits: ***   Patient {Actions; denies-reports:120008} hypoglycemic events. Patient {Actions; denies-reports:120008} polyuria (increased urination).  Patient {Actions; denies-reports:120008} polyphagia (increased appetite).  Patient {Actions; denies-reports:120008} polydipsia (increased thirst).  Patient {Actions; denies-reports:120008} neuropathy (nerve pain). Patient {Actions; denies-reports:120008} visual changes. Patient {Actions; denies-reports:120008} self foot exams.   Home fasting blood sugars: ***  2 hour post-meal/random blood  sugars: ***  Objective:   Labs:   Physical Exam  ROS  Lab Results  Component Value Date   HGBA1C 11.1 (A) 06/05/2021   HGBA1C 8.8 (A) 09/26/2020   HGBA1C 8.2 (A) 04/28/2020    There were no vitals filed for this visit.  Lab Results  Component Value Date   MICRALBCREAT 4 06/05/2021    Lipid Panel     Component Value Date/Time   CHOL 148 04/10/2018 1140   TRIG 109 04/10/2018 1140   HDL 38 (L) 04/10/2018 1140   CHOLHDL 3.9 04/10/2018 1140   CHOLHDL 4.4 10/28/2014 1112   VLDL 39 10/28/2014 1112   LDLCALC 88 04/10/2018 1140    Clinical Atherosclerotic Cardiovascular Disease (ASCVD): {YES/NO:21197} The ASCVD Risk score Mikey Bussing DC Jr., et al., 2013) failed to calculate for the following reasons:   Cannot find a previous HDL lab   Cannot find a previous total cholesterol lab   PHQ-9 Score: ***  Assessment/Plan:   ***T1/T2DM {ACTION; IS/IS TML:46503546} controlled likely due to ***. Medication adherence appears ***. Additional pharmacotherapy {ACTION; IS/IS FKC:12751700}. Patient with a history of intolerance to ***. Will initiate ***. Benefits of medication include ***. Patient educated on purpose, proper use and potential adverse effects of ***.  Following instruction patient verbalized understanding of treatment plan.    {Meds adjust:18428} basal insulin *** (insulin ***). Patient will continue to titrate 1 unit every *** days if fasting blood sugar > 100mg /dl until fasting blood sugars reach goal or next visit. {Meds adjust:18428}  rapid insulin *** (insulin ***) to ***.  {Meds adjust:18428} GLP-1 *** (generic name***) to ***.  {Meds adjust:18428} SGLT2-I *** (generic name***) to ***. Counseled on sick day rules for ***. Extensively discussed pathophysiology of diabetes, dietary effects on blood sugar control, and recommended lifestyle interventions,  Patient will adhere to dietary modifications Patient will exercise *** with goal to increase towards target of at least  150 minutes of moderate intensity exercise weekly Counseled on  s/sx of and management of hypoglycemia Next A1C anticipated ***.   ASCVD risk - primary***secondary prevention in patient with diabetes. Last LDL {Is/is not:9024} controlled. ASCVD risk score {Is/is not:9024} >20%  - {Desc; low/moderate/high:110033} intensity statin indicated. Aspirin {Is/is not:9024} indicated.   {Meds adjust:18428} aspirin *** mg  {Meds adjust:18428} ***statin *** mg.   Hypertension longstanding*** currently ***.  Blood pressure goal = *** mmHg. Medication adherence ***.  Blood pressure control is suboptimal due to ***.  *** Extensively discussed pathophysiology of blood pressure, dietary effects on blood pressure control, and recommended lifestyle interventions  Follow-up appointment *** to review sugar readings. Written patient instructions provided.  This appointment required *** minutes of patient care (this includes precharting, chart review, review of results, and face-to-face care).  Thank you for involving pharmacy to assist in providing this patient's care.  Patient seen with ***

## 2021-07-04 NOTE — Addendum Note (Signed)
Addended by: Hulan Fray on: 07/04/2021 06:13 PM   Modules accepted: Orders

## 2021-08-15 ENCOUNTER — Encounter: Payer: Self-pay | Admitting: Pharmacist

## 2021-08-15 ENCOUNTER — Ambulatory Visit (INDEPENDENT_AMBULATORY_CARE_PROVIDER_SITE_OTHER): Payer: Medicare HMO | Admitting: Pharmacist

## 2021-08-15 DIAGNOSIS — Z0000 Encounter for general adult medical examination without abnormal findings: Secondary | ICD-10-CM

## 2021-08-15 NOTE — Progress Notes (Signed)
This AWV is being conducted by [facility] only. The patient was located at home and I was located in [facility]. The patient's identity was confirmed using their DOB and current address. The patient or his/her legal guardian has consented to being evaluated through a telephone encounter and understands the associated risks (an examination cannot be done and the patient may need to come in for an appointment) / benefits (allows the patient to remain at home, decreasing exposure to coronavirus). I personally spent 34 minutes conducting the AWV.  Subjective:   Donald Palmer is a 60 y.o. male who presents for a Medicare Annual Wellness Visit.  The following items have been reviewed and updated today in the appropriate area in the EMR.   Health Risk Assessment  Height, weight, BMI, and BP Visual acuity if needed Depression screen Fall risk / safety level Advance directive discussion Medical and family history were reviewed and updated Updating list of other providers & suppliers Medication reconciliation, including over the counter medicines Cognitive screen Written screening schedule Risk Factor list Personalized health advice, risky behaviors, and treatment advice  Social History   Social History Narrative   Prior incarceration. Tattoos, inc ones obtained in prison.    On disability since 09/2013         Objective:    Vitals: There were no vitals taken for this visit. Vitals are unable to obtained due to [id number] public health emergency  Activities of Daily Living In your present state of health, do you have any difficulty performing the following activities: 06/05/2021 09/26/2020  Hearing? N N  Vision? N N  Difficulty concentrating or making decisions? N N  Walking or climbing stairs? N N  Dressing or bathing? N N  Doing errands, shopping? N N  Some recent data might be hidden    Goals  Goals      Blood Pressure < 140/90     HEMOGLOBIN A1C < 7.0     LDL CALC < 100         Fall Risk Fall Risk  06/05/2021 09/26/2020 04/28/2020 02/05/2019 01/30/2019  Falls in the past year? 0 0 0 0 0  Number falls in past yr: 0 0 0 0 -  Injury with Fall? 0 0 0 - -  Risk for fall due to : - - - - -  Risk for fall due to: Comment - - - - -  Follow up - - - Falls evaluation completed -    Depression Screen PHQ 2/9 Scores 06/05/2021 09/26/2020 04/28/2020 01/28/2020  PHQ - 2 Score 0 0 0 0  PHQ- 9 Score - 0 0 0     Cognitive Testing Six-Item Cognitive Screener   "I would like to ask you some questions that ask you to use your memory. I am going to name three objects. Please wait until I say all three words, then repeat them. Remember what they are  because I am going to ask you to name them again in a few minutes. Please repeat these words for me: APPLE--TABLE--PENNY." (Interviewer may repeat names 3 times if necessary but repetition not scored.)  Did patient correctly repeat all three words? Yes - may proceed with screen  What year is this? Correct What month is this? Correct What day of the week is this? Correct  What were the three objects I asked you to remember? Apple Correct Table Incorrect Penny Correct  Score one point for each incorrect answer.  A score of  2 or more points warrants additional investigation.  Patient's score 1     Assessment and Plan:    During the course of the visit the patient was educated and counseled about appropriate screening and preventive services as documented in the assessment and plan.  Patient is due to follow-up in clinic but has difficulty due to work schedule. Patient requested to make provider appointment. At that appointment patient is due for lipid panel. Patient also aware he is due for colonoscopy and eye exam.  The printed AVS was given to the patient and included an updated screening schedule, a list of risk factors, and personalized health advice.        Hughes Better, RPH-CPP  08/15/2021

## 2021-08-16 NOTE — Patient Instructions (Addendum)
Things That May Be Affecting Your Health:   Alcohol   Hearing loss  X Pain     Depression   Home Safety   Sexual Health   X Diabetes   Lack of physical activity   Stress    Difficulty with daily activities   Loneliness   Tiredness    Drug use   Medicines  X Tobacco use    Falls   Motor Vehicle Safety   Weight    Food choices   Oral Health   Other      YOUR PERSONALIZED HEALTH PLAN : 1. Schedule your next subsequent Medicare Wellness visit in one year 2. Attend all of your regular appointments to address your medical issues 3. Complete the preventative screenings and services 4. You are due for a colonoscopy and your yearly eye exam 5. Please make an appointment to be seen in clinic as you are due for a follow-up and also need your yearly lipid panel drawn to check your cholesterol     Annual Wellness Visit                       Medicare Covered Preventative Screenings and [place] Men and Women Who How Often Need? Date of Last Service Action  Abdominal Aortic Aneurysm Adults with AAA risk factors Once        Alcohol Misuse and Counseling All Adults Screening once a year if no alcohol misuse. Counseling up to 4 face to face sessions.        Bone Density Measurement  Adults at risk for osteoporosis Once every 2 yrs        Lipid Panel Z13.6 All adults without CV disease Once every 5 yrs            Colorectal Cancer  Stool sample or Colonoscopy All adults 12 and older   Once every year Every 10 years Yes            Depression All Adults Once a year   Today    Diabetes Screening Blood glucose, post glucose load, or GTT Z13.1 All adults at risk Pre-diabetics Once per year Twice per year          Diabetes  Self-Management Training All adults Diabetics 10 hrs first year; 2 hours subsequent years. Requires Copay Yes      Glaucoma Diabetics Family history of glaucoma African Americans 41 yrs + Hispanic Americans 56 yrs + Annually - requires coppay           Hepatitis C Z72.89 or F19.20 High Risk for HCV Born between 1945 and 1965 Annually Once          HIV Z11.4 All adults based on risk Annually btw ages 61 & 29 regardless of risk Annually > 65 yrs if at increased risk          Lung Cancer Screening Asymptomatic adults aged 78-77 with 30 pack yr history and current smoker OR quit within the last 15 yrs Annually Must have counseling and shared decision making documentation before first screen          Medical Nutrition Therapy Adults with  Diabetes Renal disease Kidney transplant within past 3 yrs 3 hours first year; 2 hours subsequent years        Obesity and Counseling All adults Screening once a year Counseling if BMI 30 or higher   Today    Tobacco Use Counseling Adults who use tobacco  Up to 8 visits in one year        Vaccines Z23 Hepatitis B Influenza  Pneumonia  Adults   Once Once every flu season Two different vaccines separated by one year        Next Annual Wellness Visit People with Medicare Every year   Today        Services & Screenings Women Who How Often Need  Date of Last Service Action  Mammogram  Z12.31 Women over 59 One baseline ages 56-39. Annually ager 40 yrs+          Pap tests All women Annually if high risk. Every 2 yrs for normal risk women          Screening for cervical cancer with  Pap (Z01.419 nl or Z01.411abnl) & HPV Z11.51 Women aged 33 to 1 Once every 5 yrs        Screening pelvic and breast exams All women Annually if high risk. Every 2 yrs for normal risk women        Sexually Transmitted Diseases Chlamydia Gonorrhea Syphilis All at risk adults Annually for non pregnant females at increased risk                [place] Men Who How Ofter Need  Date of Last Service Action  Prostate Cancer - DRE & PSA Men over 50 Annually.  DRE might require a copay.              Sexually Transmitted Diseases Syphilis All at risk adults Annually for men at increased risk           Health Maintenance List     Health Maintenance  Topic Date Due   COLONOSCOPY (Pts 45-39yr Insurance coverage will need to be confirmed)  Never done   LIPID PANEL  04/11/2019   COLON CANCER SCREENING ANNUAL FOBT  04/17/2019   OPHTHALMOLOGY EXAM  06/09/2020   FOOT EXAM  10/07/2020   HEMOGLOBIN A1C  12/27/2020   URINE MICROALBUMIN  01/27/2021   COVID-19 Vaccine (3 - Booster for Pfizer series) 03/05/2021   INFLUENZA VACCINE  07/17/2021   TETANUS/TDAP  07/21/2022   PNEUMOCOCCAL POLYSACCHARIDE VACCINE AGE 25-64 HIGH RISK  Completed   Hepatitis C Screening  Completed   HIV Screening  Completed   HPV VACCINES  Aged Out    Colonoscopy, Adult A colonoscopy is a procedure to look at the entire large intestine. This procedure is done using a long, thin, flexible tube that has a camera on the end. You may have a colonoscopy: As a part of normal colorectal screening. If you have certain symptoms, such as: A low number of red blood cells in your blood (anemia). Diarrhea that does not go away. Pain in your abdomen. Blood in your stool. A colonoscopy can help screen for and diagnose medical problems, including: Tumors. Extra tissue that grows where mucus forms (polyps). Inflammation. Areas of bleeding. Tell your health care provider about: Any allergies you have. All medicines you are taking, including vitamins, herbs, eye drops, creams, and over-the-counter medicines. Any problems you or family members have had with anesthetic medicines. Any blood disorders you have. Any surgeries you have had. Any medical conditions you have. Any problems you have had with having bowel movements. Whether you are pregnant or may be pregnant. What are the risks? Generally, this is a safe procedure. However, problems may occur, including: Bleeding. Damage to your intestine. Allergic reactions to medicines given during the procedure. Infection. This  is rare. What happens before the procedure? Eating  and drinking restrictions Follow instructions from your health care provider about eating or drinking restrictions, which may include: A few days before the procedure: Follow a low-fiber diet. Avoid nuts, seeds, dried fruit, raw fruits, and vegetables. 1-3 days before the procedure: Eat only gelatin dessert or ice pops. Drink only clear liquids, such as water, clear juice, clear broth or bouillon, black coffee or tea, or clear soft drinks or sports drinks. Avoid liquids that contain red or purple dye. The day of the procedure: Do not eat solid foods. You may continue to drink clear liquids until up to 2 hours before the procedure. Do not eat or drink anything starting 2 hours before the procedure, or within the time period that your health care provider recommends. Bowel prep If you were prescribed a bowel prep to take by mouth (orally) to clean out your colon: Take it as told by your health care provider. Starting the day before your procedure, you will need to drink a large amount of liquid medicine. The liquid will cause you to have many bowel movements of loose stool until your stool becomes almost clear or light green. If your skin or the opening between the buttocks (anus) gets irritated from diarrhea, you may relieve the irritation using: Wipes with medicine in them, such as adult wet wipes with aloe and vitamin E. A product to soothe skin, such as petroleum jelly. If you vomit while drinking the bowel prep: Take a break for up to 60 minutes. Begin the bowel prep again. Call your health care provider if you keep vomiting or you cannot take the bowel prep without vomiting. To clean out your colon, you may also be given: Laxative medicines. These help you have a bowel movement. Instructions for enema use. An enema is liquid medicine injected into your rectum. Medicines Ask your health care provider about: Changing or stopping your regular medicines or supplements. This is especially  important if you are taking iron supplements, diabetes medicines, or blood thinners. Taking medicines such as aspirin and ibuprofen. These medicines can thin your blood. Do not take these medicines unless your health care provider tells you to take them. Taking over-the-counter medicines, vitamins, herbs, and supplements. General instructions Ask your health care provider what steps will be taken to help prevent infection. These may include washing skin with a germ-killing soap. Plan to have someone take you home from the hospital or clinic. What happens during the procedure?  An IV will be inserted into one of your veins. You may be given one or more of the following: A medicine to help you relax (sedative). A medicine to numb the area (local anesthetic). A medicine to make you fall asleep (general anesthetic). This is rarely needed. You will lie on your side with your knees bent. The tube will: Have oil or gel put on it (be lubricated). Be inserted into your anus. Be gently eased through all parts of your large intestine. Air will be sent into your colon to keep it open. This may cause some pressure or cramping. Images will be taken with the camera and will appear on a screen. A small tissue sample may be removed to be looked at under a microscope (biopsy). The tissue may be sent to a lab for testing if any signs of problems are found. If small polyps are found, they may be removed and checked for cancer cells. When the procedure is finished, the tube will be  removed. The procedure may vary among health care providers and hospitals. What happens after the procedure? Your blood pressure, heart rate, breathing rate, and blood oxygen level will be monitored until you leave the hospital or clinic. You may have a small amount of blood in your stool. You may pass gas and have mild cramping or bloating in your abdomen. This is caused by the air that was used to open your colon during the  exam. Do not drive for 24 hours after the procedure. It is up to you to get the results of your procedure. Ask your health care provider, or the department that is doing the procedure, when your results will be ready. Summary A colonoscopy is a procedure to look at the entire large intestine. Follow instructions from your health care provider about eating and drinking before the procedure. If you were prescribed an oral bowel prep to clean out your colon, take it as told by your health care provider. During the colonoscopy, a flexible tube with a camera on its end is inserted into the anus and then passed into the other parts of the large intestine. This information is not intended to replace advice given to you by your health care provider. Make sure you discuss any questions you have with your health care provider. Document Revised: 06/26/2019 Document Reviewed: 06/26/2019 Elsevier Patient Education  2022 [place] for Type 2 Diabetes A screening test for type 2 diabetes (type 2 diabetes mellitus) is a blood test to measure your blood sugar (glucose) level. This test is done to check for early signs of diabetes, before you develop symptoms.  Type 2 diabetes is a long-term (chronic) disease. In type 2 diabetes, one or both of these problems may be present: The pancreas does not make enough of a hormone called insulin. Cells in the body do not respond properly to insulin that the body makes (insulin resistance). Normally, insulin allows blood sugar (glucose) to enter cells in the body. The cells use glucose for energy. Insulin resistance or lack of insulin causes excess glucose to build up in the blood instead of going into cells. This results in high blood glucose levels (hyperglycemia), which can cause many complications. You may be screened for type 2 diabetes as part of your regular health care, especially if you have a high risk for diabetes. Screening can help to identify type  2 diabetes at its early stage (prediabetes). Identifying and treating prediabetes may delay or prevent the development of type 2 diabetes. Tell a health care provider about: All medicines you are taking, including vitamins, herbs, eye drops, creams, and over-the-counter medicines. Any bleeding problems you have. Any medical conditions you have. Whether you are pregnant or may be pregnant. Who should be screened for type 2 diabetes? Adults Adults age 1 and older. These adults should be screened once every three years. Adults who are any age, are overweight, and have one other risk factor. These adults should be screened once every three years. Adults who have normal blood glucose levels and two or more risk factors. These adults may be screened once every year (annually). Women who have had gestational diabetes in the past. These women should be screened once every three years. Pregnant women who have risk factors. These women should be screened at their first prenatal visit and again between weeks 24 and 28 of pregnancy. Children and adolescents Children and adolescents should be screened for type 2 diabetes if they are overweight and have any  of the following risk factors: A family history of type 2 diabetes. Being a member of a high-risk ethnic group. Signs of insulin resistance or conditions that are associated with insulin resistance. A mother who had gestational diabetes while pregnant. Screening should be done at least once every three years, starting at age 20 or at the onset of puberty, whichever comes first. Your health care provider or your child's health care provider may recommend having a screening more or less often. What are the risk factors for type 2 diabetes? The following are factors that may make you more likely to develop type 2 diabetes and can be modified: Not getting enough exercise. Having high blood pressure. Having low levels of good cholesterol (HDL-C) or high  levels of blood fats (triglycerides). Having high blood glucose in a previous blood test. Being overweight or obese. The following are factors that may make you more likely to develop type 2 diabetes and can not be modified: Having a parent or sibling (first-degree relative) who has diabetes. Being of American-Indian, African-American, Hispanic/Latino, Asian, or [place] descent. Being older than age 82. Having a history of diabetes during pregnancy (gestational diabetes). Having certain diseases or conditions that may be caused by insulin resistance, including: Acanthosis nigricans. This is a condition that causes dark skin on the neck, armpits, and groin. Polycystic ovary syndrome (PCOS). Cardiovascular heart disease. What happens during screening? During screening, your health care provider may ask questions about: Your health and your risk factors, including your activity level and any medical conditions that you have. The health of your first-degree relatives. Past pregnancies, if this applies. Your health care provider will also do a physical exam, including a blood pressure measurement and blood tests. There are four blood tests that can be used to screen for type 2 diabetes. You may have one or more of the following: A fasting blood glucose (FBG) test. You will not be allowed to eat (you will fast) for 8 hours or more before a blood sample is taken. A random blood glucose test. This test checks your blood glucose at any time of the day regardless of when you ate. An oral glucose tolerance test (OGTT). This test measures your blood glucose at two times: After you have not eaten (have fasted) overnight. This is your baseline glucose level. Two hours after you drink a glucose-containing beverage. An A1C (hemoglobin A1C) blood test. This test provides information about blood glucose control over the previous 2-3 months. What do the results mean? Your test results are a  measurement of how much glucose is in your blood. Normal blood glucose levels mean that you do not have diabetes or prediabetes. High blood glucose levels may mean that you have prediabetes or diabetes. Depending on the results, other tests may be needed to confirm the diagnosis. You may be diagnosed with type 2 diabetes if: Your FBG level is 126 mg/dL (7.0 mmol/L) or higher. Your random blood glucose level is 200 mg/dL (11.1 mmol/L) or higher. Your A1C level is 6.5% or higher. Your OGTT result is higher than 200 mg/dL (11.1 mmol/L). These blood tests may be repeated to confirm your diagnosis. Talk with your health care provider about what your results mean. Summary A screening test for type 2 diabetes (type 2 diabetes mellitus) is a blood test to measure your blood sugar (glucose) level. Know what your risk factors are for developing type 2 diabetes. If you are at risk, get screening tests as often as told by your  health care provider. Screening may help you identify type 2 diabetes at its early stage (prediabetes). Identifying and treating prediabetes may delay or prevent the development of type 2 diabetes. This information is not intended to replace advice given to you by your health care provider. Make sure you discuss any questions you have with your health care provider. Document Revised: 02/27/2021 Document Reviewed: 02/27/2021 Elsevier Patient Education  [place].  Diabetes Mellitus and [place] care is an important part of your health, especially when you have diabetes. Diabetes may cause you to have problems because of poor blood flow (circulation) to your feet and legs, which can cause your skin to: Become thinner and drier. Break more easily. Heal more slowly. Peel and crack. You may also have nerve damage (neuropathy) in your legs and feet, causing decreased feeling in them. This means that you may not notice minor injuries to your feet that could lead to more  serious problems. Noticing and addressing any potential problems early is the best way to prevent future foot problems. How to care for your feet Foot hygiene  Wash your feet daily with warm water and mild soap. Do not use hot water. Then, pat your feet and the areas between your toes until they are completely dry. Do not soak your feet as this can dry your skin. Trim your toenails straight across. Do not dig under them or around the cuticle. File the edges of your nails with an emery board or nail file. Apply a moisturizing lotion or petroleum jelly to the skin on your feet and to dry, brittle toenails. Use lotion that does not contain alcohol and is unscented. Do not apply lotion between your toes. Shoes and socks Wear clean socks or stockings every day. Make sure they are not too tight. Do not wear knee-high stockings since they may decrease blood flow to your legs. Wear shoes that fit properly and have enough cushioning. Always look in your shoes before you put them on to be sure there are no objects inside. To break in new shoes, wear them for just a few hours a day. This prevents injuries on your feet. Wounds, scrapes, corns, and calluses  Check your feet daily for blisters, cuts, bruises, sores, and redness. If you cannot see the bottom of your feet, use a mirror or ask someone for help. Do not cut corns or calluses or try to remove them with medicine. If you find a minor scrape, cut, or break in the skin on your feet, keep it and the skin around it clean and dry. You may clean these areas with mild soap and water. Do not clean the area with peroxide, alcohol, or iodine. If you have a wound, scrape, corn, or callus on your foot, look at it several times a day to make sure it is healing and not infected. Check for: Redness, swelling, or pain. Fluid or blood. Warmth. Pus or a bad smell. General tips Do not cross your legs. This may decrease blood flow to your feet. Do not use heating pads  or hot water bottles on your feet. They may burn your skin. If you have lost feeling in your feet or legs, you may not know this is happening until it is too late. Protect your feet from hot and cold by wearing shoes, such as at the beach or on hot pavement. Schedule a complete foot exam at least once a year (annually) or more often if you have foot problems.  Report any cuts, sores, or bruises to your health care provider immediately. Where to find more information American Diabetes Association: www.diabetes.org Association of Diabetes Care & Education Specialists: www.diabeteseducator.org Contact a health care provider if: You have a medical condition that increases your risk of infection and you have any cuts, sores, or bruises on your feet. You have an injury that is not healing. You have redness on your legs or feet. You feel burning or tingling in your legs or feet. You have pain or cramps in your legs and feet. Your legs or feet are numb. Your feet always feel cold. You have pain around any toenails. Get help right away if: You have a wound, scrape, corn, or callus on your foot and: You have pain, swelling, or redness that gets worse. You have fluid or blood coming from the wound, scrape, corn, or callus. Your wound, scrape, corn, or callus feels warm to the touch. You have pus or a bad smell coming from the wound, scrape, corn, or callus. You have a fever. You have a red line going up your leg. Summary Check your feet every day for blisters, cuts, bruises, sores, and redness. Apply a moisturizing lotion or petroleum jelly to the skin on your feet and to dry, brittle toenails. Wear shoes that fit properly and have enough cushioning. If you have foot problems, report any cuts, sores, or bruises to your health care provider immediately. Schedule a complete foot exam at least once a year (annually) or more often if you have foot problems. This information is not intended to replace  advice given to you by your health care provider. Make sure you discuss any questions you have with your health care provider. Document Revised: 06/23/2020 Document Reviewed: 06/23/2020 Elsevier Patient Education  2022 [place] Prevention in the Home, Adult Falls can cause injuries and can happen to people of all ages. There are many things you can do to make your home safe and to help prevent falls. Ask for help when making these changes. What actions can I take to prevent falls? General Instructions Use good lighting in all rooms. Replace any light bulbs that burn out. Turn on the lights in dark areas. Use night-lights. Keep items that you use often in easy-to-reach places. Lower the shelves around your home if needed. Set up your furniture so you have a clear path. Avoid moving your furniture around. Do not have throw rugs or other things on the floor that can make you trip. Avoid walking on wet floors. If any of your floors are uneven, fix them. Add color or contrast paint or tape to clearly mark and help you see: Grab bars or handrails. First and last steps of staircases. Where the edge of each step is. If you use a stepladder: Make sure that it is fully opened. Do not climb a closed stepladder. Make sure the sides of the stepladder are locked in place. Ask someone to hold the stepladder while you use it. Know where your pets are when moving through your home. What can I do in the bathroom?   Keep the floor dry. Clean up any water on the floor right away. Remove soap buildup in the tub or shower. Use nonskid mats or decals on the floor of the tub or shower. Attach bath mats securely with double-sided, nonslip rug tape. If you need to sit down in the shower, use a plastic, nonslip stool. Install grab bars by the toilet and in the tub  and shower. Do not use towel bars as grab bars. What can I do in the bedroom? Make sure that you have a light by your bed that is easy  to reach. Do not use any sheets or blankets for your bed that hang to the floor. Have a firm chair with side arms that you can use for support when you get dressed. What can I do in the kitchen? Clean up any spills right away. If you need to reach something above you, use a step stool with a grab bar. Keep electrical cords out of the way. Do not use floor polish or wax that makes floors slippery. What can I do with my stairs? Do not leave any items on the stairs. Make sure that you have a light switch at the top and the bottom of the stairs. Make sure that there are handrails on both sides of the stairs. Fix handrails that are broken or loose. Install nonslip stair treads on all your stairs. Avoid having throw rugs at the top or bottom of the stairs. Choose a carpet that does not hide the edge of the steps on the stairs. Check carpeting to make sure that it is firmly attached to the stairs. Fix carpet that is loose or worn. What can I do on the outside of my home? Use bright outdoor lighting. Fix the edges of walkways and driveways and fix any cracks. Remove anything that might make you trip as you walk through a door, such as a raised step or threshold. Trim any bushes or trees on paths to your home. Check to see if handrails are loose or broken and that both sides of all steps have handrails. Install guardrails along the edges of any raised decks and porches. Clear paths of anything that can make you trip, such as tools or rocks. Have leaves, snow, or ice cleared regularly. Use sand or salt on paths during winter. Clean up any spills in your garage right away. This includes grease or oil spills. What other actions can I take? Wear shoes that: Have a low heel. Do not wear high heels. Have rubber bottoms. Feel good on your feet and fit well. Are closed at the toe. Do not wear open-toe sandals. Use tools that help you move around if needed. These  include: Canes. Walkers. Scooters. Crutches. Review your medicines with your doctor. Some medicines can make you feel dizzy. This can increase your chance of falling. Ask your doctor what else you can do to help prevent falls. Where to find more information Centers for Disease Control and Prevention, [employer]: http://www.wolf.info/ National Institute on Aging: http://kim-miller.com/ Contact a doctor if: You are afraid of falling at home. You feel weak, drowsy, or dizzy at home. You fall at home. Summary There are many simple things that you can do to make your home safe and to help prevent falls. Ways to make your home safe include removing things that can make you trip and installing grab bars in the bathroom. Ask for help when making these changes in your home. This information is not intended to replace advice given to you by your health care provider. Make sure you discuss any questions you have with your health care provider. Document Revised: 07/06/2020 Document Reviewed: 07/06/2020 Elsevier Patient Education  Leslie Maintenance, Male Adopting a healthy lifestyle and getting preventive care are important in promoting health and wellness. Ask your health care provider about: The right schedule for you to have regular  tests and exams. Things you can do on your own to prevent diseases and keep yourself healthy. What should I know about diet, weight, and exercise? Eat a healthy diet  Eat a diet that includes plenty of vegetables, fruits, low-fat dairy products, and lean protein. Do not eat a lot of foods that are high in solid fats, added sugars, or sodium. Maintain a healthy weight Body mass index (BMI) is a measurement that can be used to identify possible weight problems. It estimates body fat based on height and weight. Your health care provider can help determine your BMI and help you achieve or maintain a healthy weight. Get regular exercise Get regular exercise. This is  one of the most important things you can do for your health. Most adults should: Exercise for at least 150 minutes each week. The exercise should increase your heart rate and make you sweat (moderate-intensity exercise). Do strengthening exercises at least twice a week. This is in addition to the moderate-intensity exercise. Spend less time sitting. Even light physical activity can be beneficial. Watch cholesterol and blood lipids Have your blood tested for lipids and cholesterol at 60 years of age, then have this test every 5 years. You may need to have your cholesterol levels checked more often if: Your lipid or cholesterol levels are high. You are older than 60 years of age. You are at high risk for heart disease. What should I know about cancer screening? Many types of cancers can be detected early and may often be prevented. Depending on your health history and family history, you may need to have cancer screening at various ages. This may include screening for: Colorectal cancer. Prostate cancer. Skin cancer. Lung cancer. What should I know about heart disease, diabetes, and high blood pressure? Blood pressure and heart disease High blood pressure causes heart disease and increases the risk of stroke. This is more likely to develop in people who have high blood pressure readings, are of African descent, or are overweight. Talk with your health care provider about your target blood pressure readings. Have your blood pressure checked: Every 3-5 years if you are 72-26 years of age. Every year if you are 42 years old or older. If you are between the ages of 47 and 20 and are a current or former smoker, ask your health care provider if you should have a one-time screening for abdominal aortic aneurysm (AAA). Diabetes Have regular diabetes screenings. This checks your fasting blood sugar level. Have the screening done: Once every three years after age 50 if you are at a normal weight and  have a low risk for diabetes. More often and at a younger age if you are overweight or have a high risk for diabetes. What should I know about preventing infection? Hepatitis B If you have a higher risk for hepatitis B, you should be screened for this virus. Talk with your health care provider to find out if you are at risk for hepatitis B infection. Hepatitis C Blood testing is recommended for: Everyone born from [birth year] through 1965. Anyone with known risk factors for hepatitis C. Sexually transmitted infections (STIs) You should be screened each year for STIs, including gonorrhea and chlamydia, if: You are sexually active and are younger than 60 years of age. You are older than 60 years of age and your health care provider tells you that you are at risk for this type of infection. Your sexual activity has changed since you were last screened, and you  are at increased risk for chlamydia or gonorrhea. Ask your health care provider if you are at risk. Ask your health care provider about whether you are at high risk for HIV. Your health care provider may recommend a prescription medicine to help prevent HIV infection. If you choose to take medicine to prevent HIV, you should first get tested for HIV. You should then be tested every 3 months for as long as you are taking the medicine. Follow these instructions at home: Lifestyle Do not use any products that contain nicotine or tobacco, such as cigarettes, e-cigarettes, and chewing tobacco. If you need help quitting, ask your health care provider. Do not use street drugs. Do not share needles. Ask your health care provider for help if you need support or information about quitting drugs. Alcohol use Do not drink alcohol if your health care provider tells you not to drink. If you drink alcohol: Limit how much you have to 0-2 drinks a day. Be aware of how much alcohol is in your drink. In the [place] one drink equals one 12 oz bottle of beer (355 mL),  one 5 oz glass of wine (148 mL), or one 1 oz glass of hard liquor (44 mL). General instructions Schedule regular health, dental, and eye exams. Stay current with your vaccines. Tell your health care provider if: You often feel depressed. You have ever been abused or do not feel safe at home. Summary Adopting a healthy lifestyle and getting preventive care are important in promoting health and wellness. Follow your health care provider's instructions about healthy diet, exercising, and getting tested or screened for diseases. Follow your health care provider's instructions on monitoring your cholesterol and blood pressure. This information is not intended to replace advice given to you by your health care provider. Make sure you discuss any questions you have with your health care provider. Document Revised: 02/10/2021 Document Reviewed: 11/26/2018 Elsevier Patient Education  2022 [place].  Steps to Quit Smoking Smoking tobacco is the leading cause of preventable death. It can affect almost every organ in the body. Smoking puts you and people around you at risk for many serious, long-lasting (chronic) diseases. Quitting smoking can be hard, but it is one of the best things that you can do for your health. It is never too late to quit. How do I get ready to quit? When you decide to quit smoking, make a plan to help you succeed. Before you quit: Pick a date to quit. Set a date within the next 2 weeks to give you time to prepare. Write down the reasons why you are quitting. Keep this list in places where you will see it often. Tell your family, friends, and co-workers that you are quitting. Their support is important. Talk with your doctor about the choices that may help you quit. Find out if your health insurance will pay for these treatments. Know the people, places, things, and activities that make you want to smoke (triggers). Avoid them. What first steps can I take to quit  smoking? Throw away all cigarettes at home, at work, and in your car. Throw away the things that you use when you smoke, such as ashtrays and lighters. Clean your car. Make sure to empty the ashtray. Clean your home, including curtains and carpets. What can I do to help me quit smoking? Talk with your doctor about taking medicines and seeing a counselor at the same time. You are more likely to succeed when you do both.  If you are pregnant or breastfeeding, talk with your doctor about counseling or other ways to quit smoking. Do not take medicine to help you quit smoking unless your doctor tells you to do so. To quit smoking: Quit right away Quit smoking totally, instead of slowly cutting back on how much you smoke over a period of time. Go to counseling. You are more likely to quit if you go to counseling sessions regularly. Take medicine You may take medicines to help you quit. Some medicines need a prescription, and some you can buy over-the-counter. Some medicines may contain a drug called nicotine to replace the nicotine in cigarettes. Medicines may: Help you to stop having the desire to smoke (cravings). Help to stop the problems that come when you stop smoking (withdrawal symptoms). Your doctor may ask you to use: Nicotine patches, gum, or lozenges. Nicotine inhalers or sprays. Non-nicotine medicine that is taken by mouth. Find resources Find resources and other ways to help you quit smoking and remain smoke-free after you quit. These resources are most helpful when you use them often. They include: Online chats with a Social worker. Phone quitlines. Printed Furniture conservator/restorer. Support groups or group counseling. Text messaging programs. Mobile phone apps. Use apps on your mobile phone or tablet that can help you stick to your quit plan. There are many free apps for mobile phones and tablets as well as websites. Examples include Quit Guide from the [employer] and smokefree.gov  What things  can I do to make it easier to quit?  Talk to your family and friends. Ask them to support and encourage you. Call a phone quitline (1-800-QUIT-NOW), reach out to support groups, or work with a Social worker. Ask people who smoke to not smoke around you. Avoid places that make you want to smoke, such as: Bars. Parties. Smoke-break areas at work. Spend time with people who do not smoke. Lower the stress in your life. Stress can make you want to smoke. Try these things to help your stress: Getting regular exercise. Doing deep-breathing exercises. Doing yoga. Meditating. Doing a body scan. To do this, close your eyes, focus on one area of your body at a time from head to toe. Notice which parts of your body are tense. Try to relax the muscles in those areas. How will I feel when I quit smoking? Day 1 to 3 weeks Within the first 24 hours, you may start to have some problems that come from quitting tobacco. These problems are very bad 2-3 days after you quit, but they do not often last for more than 2-3 weeks. You may get these symptoms: Mood swings. Feeling restless, nervous, angry, or annoyed. Trouble concentrating. Dizziness. Strong desire for high-sugar foods and nicotine. Weight gain. Trouble pooping (constipation). Feeling like you may vomit (nausea). Coughing or a sore throat. Changes in how the medicines that you take for other issues work in your body. Depression. Trouble sleeping (insomnia). Week 3 and afterward After the first 2-3 weeks of quitting, you may start to notice more positive results, such as: Better sense of smell and taste. Less coughing and sore throat. Slower heart rate. Lower blood pressure. Clearer skin. Better breathing. Fewer sick days. Quitting smoking can be hard. Do not give up if you fail the first time. Some people need to try a few times before they succeed. Do your best to stick to your quit plan, and talk with your doctor if you have any questions  or concerns. Summary Smoking tobacco is the  leading cause of preventable death. Quitting smoking can be hard, but it is one of the best things that you can do for your health. When you decide to quit smoking, make a plan to help you succeed. Quit smoking right away, not slowly over a period of time. When you start quitting, seek help from your doctor, family, or friends. This information is not intended to replace advice given to you by your health care provider. Make sure you discuss any questions you have with your health care provider. Document Revised: 08/28/2019 Document Reviewed: 02/21/2019 Elsevier Patient Education  [place].

## 2021-08-17 ENCOUNTER — Telehealth: Payer: Self-pay | Admitting: Dietician

## 2021-08-17 NOTE — Telephone Encounter (Signed)
Left voicemail for return call  

## 2021-08-22 ENCOUNTER — Other Ambulatory Visit: Payer: Self-pay | Admitting: *Deleted

## 2021-08-22 DIAGNOSIS — I739 Peripheral vascular disease, unspecified: Secondary | ICD-10-CM

## 2021-08-22 NOTE — Telephone Encounter (Signed)
Next appt scheduled 9/19 with PCP. 

## 2021-08-23 MED ORDER — CLOPIDOGREL BISULFATE 75 MG PO TABS: 75.0000 mg | ORAL_TABLET | Freq: Every day | ORAL | 1 refills | Status: DC

## 2021-08-28 NOTE — Progress Notes (Signed)
I discussed the AWV findings with the provider who conducted the visit. I was present in the office suite and immediately available to provide assistance and direction throughout the time the service was provided.  Mitzi Hansen, MD Internal Medicine Resident PGY-2 [facility] 08/28/2021 10:16 AM

## 2021-09-04 ENCOUNTER — Encounter: Payer: Medicaid Other | Admitting: Student in an Organized Health Care Education/Training Program

## 2021-09-04 NOTE — Progress Notes (Signed)
Reviewed and agree as documented.

## 2021-09-07 ENCOUNTER — Encounter: Payer: Medicaid Other | Admitting: Internal Medicine

## 2021-09-19 ENCOUNTER — Other Ambulatory Visit: Payer: Self-pay | Admitting: Internal Medicine

## 2021-09-19 MED ORDER — CYCLOBENZAPRINE HCL 10 MG PO TABS: 10.0000 mg | ORAL_TABLET | Freq: Every day | ORAL | 0 refills | Status: DC | PRN

## 2021-09-19 NOTE — Telephone Encounter (Signed)
Call to Donald Palmer about his diabetes eye exam. He says he has an eye doctor appointment on October 8th or 18th at [facility] [address]. He would like a refill on muscle relaxer (I confirmed that he meant Flexeril). He is looking forward to his appointment with Dr. Evette Doffing to discuss his "controlled'  blood pressure and better controlled blood sugars. He says his new job is very active.

## 2021-09-19 NOTE — Addendum Note (Signed)
Addended by: Resa Miner on: 09/19/2021 02:31 PM   Modules accepted: Orders

## 2021-10-03 ENCOUNTER — Other Ambulatory Visit: Payer: Self-pay

## 2021-10-03 DIAGNOSIS — I739 Peripheral vascular disease, unspecified: Secondary | ICD-10-CM

## 2021-10-03 MED ORDER — CLOPIDOGREL BISULFATE 75 MG PO TABS: 75.0000 mg | ORAL_TABLET | Freq: Every day | ORAL | 1 refills | Status: DC

## 2021-10-16 ENCOUNTER — Encounter: Payer: Self-pay | Admitting: Student in an Organized Health Care Education/Training Program

## 2021-10-16 ENCOUNTER — Ambulatory Visit (INDEPENDENT_AMBULATORY_CARE_PROVIDER_SITE_OTHER): Payer: BC Managed Care – PPO | Admitting: Student in an Organized Health Care Education/Training Program

## 2021-10-16 VITALS — BP 150/89 | HR 78 | Temp 98.2°F | Ht 70.0 in | Wt 176.2 lb

## 2021-10-16 DIAGNOSIS — E1169 Type 2 diabetes mellitus with other specified complication: Secondary | ICD-10-CM | POA: Diagnosis not present

## 2021-10-16 DIAGNOSIS — M545 Low back pain, unspecified: Secondary | ICD-10-CM

## 2021-10-16 DIAGNOSIS — E1140 Type 2 diabetes mellitus with diabetic neuropathy, unspecified: Secondary | ICD-10-CM | POA: Diagnosis not present

## 2021-10-16 DIAGNOSIS — M479 Spondylosis, unspecified: Secondary | ICD-10-CM

## 2021-10-16 DIAGNOSIS — Z0000 Encounter for general adult medical examination without abnormal findings: Secondary | ICD-10-CM

## 2021-10-16 DIAGNOSIS — Z23 Encounter for immunization: Secondary | ICD-10-CM

## 2021-10-16 DIAGNOSIS — G8929 Other chronic pain: Secondary | ICD-10-CM | POA: Diagnosis not present

## 2021-10-16 DIAGNOSIS — I10 Essential (primary) hypertension: Secondary | ICD-10-CM

## 2021-10-16 DIAGNOSIS — E785 Hyperlipidemia, unspecified: Secondary | ICD-10-CM | POA: Diagnosis not present

## 2021-10-16 LAB — POCT GLYCOSYLATED HEMOGLOBIN (HGB A1C): Hemoglobin A1C: 8 % — AB (ref 4.0–5.6)

## 2021-10-16 LAB — GLUCOSE, CAPILLARY: Glucose-Capillary: 163 mg/dL — ABNORMAL HIGH (ref 70–99)

## 2021-10-16 MED ORDER — KETOROLAC TROMETHAMINE 30 MG/ML IJ SOLN
30.0000 mg | Freq: Once | INTRAMUSCULAR | Status: AC
  Administered 2021-10-16: 30 mg via INTRAMUSCULAR

## 2021-10-16 MED ORDER — XULTOPHY 100-3.6 UNIT-MG/ML ~~LOC~~ SOPN: 20.0000 [IU] | PEN_INJECTOR | Freq: Every day | SUBCUTANEOUS | 5 refills | Status: DC

## 2021-10-16 MED ORDER — DULOXETINE HCL 30 MG PO CPEP: 30.0000 mg | ORAL_CAPSULE | Freq: Every day | ORAL | 3 refills | Status: DC

## 2021-10-16 MED ORDER — LOSARTAN POTASSIUM 50 MG PO TABS: 50.0000 mg | ORAL_TABLET | Freq: Every day | ORAL | 3 refills | Status: DC

## 2021-10-16 NOTE — Assessment & Plan Note (Signed)
Reports regularly taking 16 units Xultophy daily with no issues. A1c 8.0 today, and will likely benefit from increased glucose control.  - Increase Xultophy 16 units to 20 units daily.

## 2021-10-16 NOTE — Assessment & Plan Note (Signed)
Continue taking Atorvastatin 80mg 

## 2021-10-16 NOTE — Assessment & Plan Note (Signed)
Flu shot today 

## 2021-10-16 NOTE — Patient Instructions (Signed)
Thank you for coming to see [place] today!  Today we discussed your back pain and diabetes control.  Important changes today: - Start taking duloxetine 30mg  daily for your back pain. - Increase your Xultophy from 16 units to 20 units. - Start taking Losartan 50mg  daily for blood pressure.  Continue taking all other medications as instructed.

## 2021-10-16 NOTE — Assessment & Plan Note (Signed)
Chronic right-sided low back pain, with radiation of pain down into buttocks bilaterally. Pain has been worse for the past two months despite taking flexeril and naproxen. Lumbar spine x-ray 05/2021 showed no evidence of fracture, no spinal misalignment, and regular disc spacing. Discussed chronic waxing/waning nature of lower back OA, but will plan for duloxetine 30mg  daily and one-time toradol injection for acute pain today.  - Start duloxetine 30mg  daily - One-time toradol injection

## 2021-10-16 NOTE — Assessment & Plan Note (Signed)
BP 150/89 in office today. Patient reports BP in 559R systolic this morning, but "ate a lot" over the weekend due to homecoming. Would benefit from greater blood pressure control.   - Start losartan 50mg  daily.

## 2021-10-16 NOTE — Progress Notes (Signed)
  Subjective:     [name], male   DOB: 1960/12/23, 60 y.o.   MRN: [id number]  Donald Palmer is a 60 y/o who presents to clinic for diabetes management and low back pain, that is chronic but causing increased discomfort over the past 2 months. He describes the pain as primarily right-sided, but radiating down into his buttocks bilaterally. He has no tingling in his legs or pain shooting down into the legs. He reports no difficulties with urination, no diarrhea, and no constipation.   He takes 16 units of xultophy daily for his diabetes. Additionally, he has been measuring his blood pressure at home. He reports eating a lot during homecoming weekend and thinks his blood pressure is probably high today as a result, but has been trying to eat well, especially during weekdays.  Back Pain Associated symptoms include abdominal pain. Pertinent negatives include no chest pain, dysuria, fever or headaches.  Diabetes Pertinent negatives for hypoglycemia include no dizziness or headaches. Pertinent negatives for diabetes include no chest pain and no fatigue.    Review of Systems  Constitutional:  Negative for fatigue and fever.  HENT:  Positive for rhinorrhea. Negative for sneezing and sore throat.   Respiratory:  Negative for cough, chest tightness and shortness of breath.   Cardiovascular:  Negative for chest pain, palpitations and leg swelling.  Gastrointestinal:  Positive for abdominal pain. Negative for constipation, diarrhea, nausea and rectal pain.  Genitourinary:  Negative for difficulty urinating, dysuria and frequency.  Musculoskeletal:  Positive for back pain.  Neurological:  Negative for dizziness, light-headedness and headaches.      Objective:   Physical Exam Constitutional:      Appearance: Normal appearance.  HENT:     Head: Normocephalic and atraumatic.     Nose: Nose normal.  Cardiovascular:     Rate and Rhythm: Normal rate and regular rhythm.     Heart sounds:  Normal heart sounds.  Pulmonary:     Effort: Pulmonary effort is normal.     Breath sounds: Normal breath sounds.  Abdominal:     General: Abdomen is flat.     Palpations: Abdomen is soft.     Tenderness: There is abdominal tenderness.     Comments: Mild epigastric tenderness to palpation (present at baseline).  Musculoskeletal:        General: Tenderness present.     Comments: Right lower back pain present with tenderness to palpation. No obvious visible deformities.  Skin:    General: Skin is warm and dry.  Neurological:     Mental Status: He is alert.     Motor: No weakness.     Deep Tendon Reflexes: Reflexes normal.     Comments: Patellar tendon reflexes normal bilaterally. Hip flexion strength 5/5 bilaterally.        Assessment:     See below for problem-based charting.    Plan:     See below for problem-based charting.

## 2021-10-16 NOTE — Progress Notes (Signed)
Attestation for Student Documentation:  I personally was present and performed or re-performed the history, physical exam and medical decision-making activities of this service and have verified that the service and findings are accurately documented in the student's note.  60 year old person here for management of diabetes. It has been one year since I have seen him, we talked about more consistent follow up. His A1c is high at 8.0%. We decided to increase Xultophy to 20units daily. His blood pressure is also high, lisinopril tolerated in the past but stopped due to inconsistent follow up. Will start losartan today. We talked about his low back pain, no red flags, not function limiting, seems like chronic lumbar spine osteoarthritis. We talked about the nature of this condition, ups and downs, set reasonable expectations for treatment. We decided to start cymbalta 30mg  daily.   Axel Filler, MD 10/16/2021, 1:54 PM

## 2021-10-23 ENCOUNTER — Other Ambulatory Visit: Payer: Self-pay

## 2021-10-23 MED ORDER — OMEPRAZOLE 40 MG PO CPDR: 40.0000 mg | DELAYED_RELEASE_CAPSULE | Freq: Every day | ORAL | 3 refills | Status: DC

## 2021-12-20 ENCOUNTER — Telehealth: Payer: Self-pay | Admitting: *Deleted

## 2021-12-20 NOTE — Telephone Encounter (Signed)
Patient called in from [facility] on [address] asking for meds that were prescribed at last OV on 10/31. Explained that Losartan, duloxetine, and Xultophy were sent on that date to [facility] in [place]. He spoke with pharmacist at [facility] and she is able to see these Rxs and get them ready for him. He will start these meds today. All other pharmacies removed from list.

## 2022-02-16 ENCOUNTER — Other Ambulatory Visit: Payer: Self-pay | Admitting: Internal Medicine

## 2022-02-16 ENCOUNTER — Other Ambulatory Visit: Payer: Self-pay

## 2022-02-16 ENCOUNTER — Encounter (HOSPITAL_COMMUNITY): Payer: Self-pay | Admitting: *Deleted

## 2022-02-16 ENCOUNTER — Emergency Department (HOSPITAL_COMMUNITY)
Admission: EM | Admit: 2022-02-16 | Discharge: 2022-02-16 | Disposition: A | Discharge status: Home / Self Care | Payer: Medicare HMO | Attending: Emergency Medicine | Admitting: Emergency Medicine

## 2022-02-16 DIAGNOSIS — R109 Unspecified abdominal pain: Secondary | ICD-10-CM | POA: Insufficient documentation

## 2022-02-16 DIAGNOSIS — Z20822 Contact with and (suspected) exposure to covid-19: Secondary | ICD-10-CM | POA: Diagnosis not present

## 2022-02-16 DIAGNOSIS — Z794 Long term (current) use of insulin: Secondary | ICD-10-CM | POA: Diagnosis not present

## 2022-02-16 DIAGNOSIS — R5383 Other fatigue: Secondary | ICD-10-CM | POA: Insufficient documentation

## 2022-02-16 DIAGNOSIS — R7989 Other specified abnormal findings of blood chemistry: Secondary | ICD-10-CM | POA: Insufficient documentation

## 2022-02-16 DIAGNOSIS — E119 Type 2 diabetes mellitus without complications: Secondary | ICD-10-CM | POA: Insufficient documentation

## 2022-02-16 DIAGNOSIS — R531 Weakness: Secondary | ICD-10-CM | POA: Diagnosis present

## 2022-02-16 LAB — CBC WITH DIFFERENTIAL/PLATELET
Abs Immature Granulocytes: 0.01 10*3/uL (ref 0.00–0.07)
Basophils Absolute: 0.1 10*3/uL (ref 0.0–0.1)
Basophils Relative: 1 %
Eosinophils Absolute: 0.1 10*3/uL (ref 0.0–0.5)
Eosinophils Relative: 1 %
HCT: 45.6 % (ref 39.0–52.0)
Hemoglobin: 14.1 g/dL (ref 13.0–17.0)
Immature Granulocytes: 0 %
Lymphocytes Relative: 46 %
Lymphs Abs: 3 10*3/uL (ref 0.7–4.0)
MCH: 22.3 pg — ABNORMAL LOW (ref 26.0–34.0)
MCHC: 30.9 g/dL (ref 30.0–36.0)
MCV: 72 fL — ABNORMAL LOW (ref 80.0–100.0)
Monocytes Absolute: 0.7 10*3/uL (ref 0.1–1.0)
Monocytes Relative: 10 %
Neutro Abs: 2.7 10*3/uL (ref 1.7–7.7)
Neutrophils Relative %: 42 %
Platelets: 285 10*3/uL (ref 150–400)
RBC: 6.33 MIL/uL — ABNORMAL HIGH (ref 4.22–5.81)
RDW: 16 % — ABNORMAL HIGH (ref 11.5–15.5)
WBC: 6.5 10*3/uL (ref 4.0–10.5)
nRBC: 0 % (ref 0.0–0.2)

## 2022-02-16 LAB — RESP PANEL BY RT-PCR (FLU A&B, COVID) ARPGX2
Influenza A by PCR: NEGATIVE
Influenza B by PCR: NEGATIVE
SARS Coronavirus 2 by RT PCR: NEGATIVE

## 2022-02-16 LAB — BASIC METABOLIC PANEL
Anion gap: 11 (ref 5–15)
BUN: 12 mg/dL (ref 8–23)
CO2: 24 mmol/L (ref 22–32)
Calcium: 9.8 mg/dL (ref 8.9–10.3)
Chloride: 99 mmol/L (ref 98–111)
Creatinine, Ser: 1.26 mg/dL — ABNORMAL HIGH (ref 0.61–1.24)
GFR, Estimated: >60 mL/min (ref >60)
Glucose, Bld: 209 mg/dL — ABNORMAL HIGH (ref 70–99)
Potassium: 3.9 mmol/L (ref 3.5–5.1)
Sodium: 134 mmol/L — ABNORMAL LOW (ref 135–145)

## 2022-02-16 LAB — CBG MONITORING, ED
Glucose-Capillary: 225 mg/dL — ABNORMAL HIGH (ref 70–99)
Glucose-Capillary: 217 mg/dL — ABNORMAL HIGH (ref 70–99)

## 2022-02-16 MED ORDER — SODIUM CHLORIDE 0.9 % IV BOLUS
1000.0000 mL | Freq: Once | INTRAVENOUS | Status: AC
  Administered 2022-02-16: 1000 mL via INTRAVENOUS

## 2022-02-16 NOTE — ED Triage Notes (Signed)
Pt reports having generalized weakness x 4 days. Reports not having an appetite, had one night of n/v/d. Denies headache, fever, sore throat. Pt is diabetic but has not checked his cbg. No acute distress noted at triage. ?

## 2022-02-16 NOTE — ED Provider Notes (Signed)
?[name] ?Provider Note ? ? ?CSN: [id number] ?Arrival date & time: 02/16/22  7322 ? ?  ? ?History ? ?Chief Complaint  ?Patient presents with  ? Weakness  ? ? ?Donald Palmer is a 61 y.o. male with a past medical history of type 2 diabetes, hyperlipidemia and GERD presenting today with a complaint of weakness.  Reports that for the past 4 days he has felt increasingly fatigued.  He says that he believes his blood sugars are too high and that he needs some insulin.  Also says that he needs IV fluids.  Reports that this started 4 days ago with some abdominal pain however the abdominal pain has subsided.  Have 1 episode of emesis at that time.  No longer with GI symptoms.  Denies urinary symptoms.  When asked if he takes his blood sugar daily he says "no I can feel it in my body when it is too high and I do not eat any sugar anyway."  Says that he occasionally gives himself insulin when he is feeling off.  Denies any insulin use today.  No numbness or tingling or confusion.  Denies any visual disturbance.  No history of CVA. ? ?  ? ?Home Medications ?Prior to Admission medications   ?Medication Sig Start Date End Date Taking? Authorizing Provider  ?Accu-Chek Softclix Lancets lancets check blood sugar 2 times a day 06/05/21   Mitzi Hansen, MD  ?atorvastatin (LIPITOR) 80 MG tablet Take 1 tablet (80 mg total) by mouth daily at 6 PM. 06/05/21   Darrick Meigs, Rylee, MD  ?blood glucose meter kit and supplies KIT Use to check glucose twice a day 10/08/19   [name], MD  ?Blood Glucose Monitoring Suppl (ACCU-CHEK GUIDE) w/Device KIT Check blood sugar 2 times a day 06/05/21   Mitzi Hansen, MD  ?clopidogrel (PLAVIX) 75 MG tablet Take 1 tablet (75 mg total) by mouth daily. 10/03/21   Axel Filler, MD  ?DULoxetine (CYMBALTA) 30 MG capsule Take 1 capsule (30 mg total) by mouth daily. 10/16/21   Axel Filler, MD  ?fluticasone [facility]) 50 MCG/ACT nasal spray USE 2  SPRAYS IN EACH NOSTRIL DAILY 06/05/21   Christian, Rylee, MD  ?glucose blood (ACCU-CHEK GUIDE) test strip USE TO CHECK GLUCOSE TWICE DAILY 06/05/21   Mitzi Hansen, MD  ?Insulin Degludec-Liraglutide (XULTOPHY) 100-3.6 UNIT-MG/ML SOPN Inject 20 Units into the skin daily. 10/16/21   Axel Filler, MD  ?Insulin Pen Needle (PEN NEEDLES) 32G X 4 MM MISC Use to inject liraglutide once a day 06/05/21   Mitzi Hansen, MD  ?Lancet Devices MISC 1 each by Does not apply route 2 (two) times daily. Please use to check blood sugar 2 times daily. diag code E11.40. noninsulin dependent 06/05/21   Mitzi Hansen, MD  ?losartan (COZAAR) 50 MG tablet Take 1 tablet (50 mg total) by mouth daily. 10/16/21   Axel Filler, MD  ?omeprazole (PRILOSEC) 40 MG capsule Take 1 capsule (40 mg total) by mouth daily. 10/23/21   Axel Filler, MD  ?   ? ?Allergies    ?Metformin and related   ? ?Review of Systems   ?Review of Systems  ?Neurological:  Positive for weakness.  ?See HPI ?Physical Exam ?Updated Vital Signs ?BP 136/90 (BP Location: Left Arm)   Pulse (!) 107   Temp 99 ?F (37.2 ?C)   Resp 16   SpO2 100%  ?Physical Exam ?Vitals and nursing note reviewed.  ?Constitutional:   ?  Appearance: Normal appearance.  ?HENT:  ?   Head: Normocephalic and atraumatic.  ?   Mouth/Throat:  ?   Mouth: Mucous membranes are moist.  ?Eyes:  ?   General: No scleral icterus. ?   Conjunctiva/sclera: Conjunctivae normal.  ?Cardiovascular:  ?   Rate and Rhythm: Normal rate and regular rhythm.  ?Pulmonary:  ?   Effort: Pulmonary effort is normal. No respiratory distress.  ?   Breath sounds: No wheezing.  ?Abdominal:  ?   General: Abdomen is flat.  ?   Palpations: Abdomen is soft.  ?Skin: ?   General: Skin is warm and dry.  ?   Findings: No rash.  ?Neurological:  ?   Mental Status: He is alert.  ?   Comments: 5 out of 5 strength of bilateral upper and lower extremities.  No focal weakness or sensory deficits.  ?Psychiatric:     ?    Mood and Affect: Mood normal.  ? ? ?ED Results / Procedures / Treatments   ?Labs ?(all labs ordered are listed, but only abnormal results are displayed) ?Labs Reviewed  ?CBC WITH DIFFERENTIAL/PLATELET - Abnormal; Notable for the following components:  ?    Result Value  ? RBC 6.33 (*)   ? MCV 72.0 (*)   ? MCH 22.3 (*)   ? RDW 16.0 (*)   ? All other components within normal limits  ?BASIC METABOLIC PANEL - Abnormal; Notable for the following components:  ? Sodium 134 (*)   ? Glucose, Bld 209 (*)   ? Creatinine, Ser 1.26 (*)   ? All other components within normal limits  ?CBG MONITORING, ED - Abnormal; Notable for the following components:  ? Glucose-Capillary 225 (*)   ? All other components within normal limits  ?RESP PANEL BY RT-PCR (FLU A&B, COVID) ARPGX2  ?CBG MONITORING, ED  ? ? ?EKG ?None ? ?Radiology ?No results found. ? ?Procedures ?Procedures  ? ?Medications Ordered in ED ?Medications  ?sodium chloride 0.9 % bolus 1,000 mL (has no administration in time range)  ? ? ?ED Course/ Medical Decision Making/ A&P ?  ?                        ?Medical Decision Making ?Amount and/or Complexity of Data Reviewed ?Labs: ordered. ? ? ?61 year old male presenting due to fatigue and weakness.  This been going on for 4 days.  Denies any chest pain, shortness of breath.  Reports that he just feels like his blood sugar might be high however he is not checking it routinely at home.  Continues to dose occasional insulin.  Requesting IV fluids and lab work ? ?I originally saw this patient in triage.  He was stable at that time and continues to be stable 6 hours later.  Vital signs within normal limits.  Being given IVF bolus as requested.  Blood sugar 209, no anion gap normal bicarb.  Creatinine 1.26, denies history of kidney disease.  Last labs 1 year ago.  Potentially increased due to dehydration or may be patient's new baseline. ? ?Treatment: Patient given IVF.  Reports feeling better and is agreeable to discharge.  He will  follow-up with a primary care provider about his kidney function as well as appropriate diabetes control/education. ? ?Final Clinical Impression(s) / ED Diagnoses ?Final diagnoses:  ?Fatigue, unspecified type  ? ? ?Rx / DC Orders ?Results and diagnoses were explained to the patient. Return precautions discussed in full. Patient had no additional questions  and expressed complete understanding. ? ? ?This chart was dictated using voice recognition software.  Despite best efforts to proofread,  errors can occur which can change the documentation meaning.  ? ? ?  ?Rhae Hammock, PA-C ?02/16/22 1748 ? ?  ?Truddie Hidden, MD ?02/18/22 [phone] ? ?

## 2022-02-16 NOTE — ED Provider Triage Note (Signed)
Emergency Medicine Provider Triage Evaluation Note ? ?Donald Palmer , a 61 y.o. male  was evaluated in triage.  Pt complains of "just feeling bad."  States that he needs IV fluids and some basic lab work to figure out what is wrong with him.  He says he is also diabetic.  When asked if he takes his blood sugar he says "I just feel it in my body and I do not eat sweets so I do not need to take it every day."  Endorsing chills, weakness and "just feeling bad." ? ?Review of Systems  ?Positive: Abdominal pain in the first 2 days however this is subsided ?Negative: Chest pain or shortness of breath ? ?Physical Exam  ?BP 128/83 (BP Location: Right Arm)   Pulse (!) 112   Temp 98.8 ?F (37.1 ?C) (Oral)   Resp 16   SpO2 99%  ?Gen:   Awake, no distress   ?Resp:  Normal effort  ?MSK:   Moves extremities without difficulty  ?Other:  RRR ? ?Medical Decision Making  ?Medically screening exam initiated at 9:20 AM.  Appropriate orders placed.  Donald Palmer was informed that the remainder of the evaluation will be completed by another provider, this initial triage assessment does not replace that evaluation, and the importance of remaining in the ED until their evaluation is complete. ? ? ?  ?Rhae Hammock, PA-C ?02/16/22 8299 ? ?

## 2022-02-20 ENCOUNTER — Telehealth: Payer: Self-pay | Admitting: *Deleted

## 2022-02-20 ENCOUNTER — Telehealth: Payer: Self-pay

## 2022-02-20 MED ORDER — CYCLOBENZAPRINE HCL 5 MG PO TABS: 5.0000 mg | ORAL_TABLET | Freq: Every day | ORAL | 0 refills | Status: DC | PRN

## 2022-02-20 NOTE — Telephone Encounter (Signed)
Spoke with Dr, Evette Doffing who will refill patient's Cyclobenzaprine for a few days.  Patient will need to keep appointment that is scheduled for him on Monday with Dr. Evette Doffing.  Attempts to call patient unable to reach or leave a message.  SMS notification was done to have patient RTC to the Clinics as soon as possible. ? ?RTC to patient informed that Dr. Evette Doffing will sent in a prescription for Cyclobenzaprine and that he will need to keep the appointment with Dr. Evette Doffing for Monday.  Appointment scheduled for tomorrow wille cancelled.  Patient voiced understanding of the plan and will come on Monday at 8:45 AM. ?

## 2022-02-20 NOTE — Telephone Encounter (Signed)
Requesting to speak with a nurse about medications refill.  ?

## 2022-02-20 NOTE — Telephone Encounter (Signed)
Ok. I have prescribed #20 of flexeril for his low back pain to be used once daily as needed. No driving after use please. He should follow up with me on Monday as scheduled and we will check in on his pain and other conditions. This way, he should not need the acute visit that was scheduled for tomorrow. ?

## 2022-02-20 NOTE — Telephone Encounter (Signed)
Call from patient with questions about his refill for Cyclobenzaprine.  Prescription was denied as patient needs to come in for an appointment.  Patient was informed that an appointment to continue the Cyclobenzaprine.  Patient agreed t come in for appointment at 8:45 AM on tomorrow.   ?

## 2022-02-20 NOTE — Addendum Note (Signed)
Addended by: Lalla Brothers T on: 02/20/2022 10:13 AM ? ? Modules accepted: Orders ? ?

## 2022-02-20 NOTE — Telephone Encounter (Addendum)
It looks like he has an appointment with me on Monday 3/13 for follow up of his diabetes and hypertension. I can refill a short supply of the flexeril if he can follow up with me at that time. That way he doesn't also have to come in tomorrow with [facility]. ?

## 2022-02-21 ENCOUNTER — Encounter: Payer: Medicaid Other | Admitting: Student

## 2022-02-26 ENCOUNTER — Encounter: Payer: Self-pay | Admitting: Student in an Organized Health Care Education/Training Program

## 2022-02-26 ENCOUNTER — Ambulatory Visit (INDEPENDENT_AMBULATORY_CARE_PROVIDER_SITE_OTHER): Payer: Medicare HMO | Admitting: Student in an Organized Health Care Education/Training Program

## 2022-02-26 VITALS — BP 149/81 | HR 99 | Temp 98.4°F | Ht 70.0 in | Wt 176.5 lb

## 2022-02-26 DIAGNOSIS — E1151 Type 2 diabetes mellitus with diabetic peripheral angiopathy without gangrene: Secondary | ICD-10-CM

## 2022-02-26 DIAGNOSIS — I10 Essential (primary) hypertension: Secondary | ICD-10-CM | POA: Diagnosis not present

## 2022-02-26 DIAGNOSIS — M479 Spondylosis, unspecified: Secondary | ICD-10-CM

## 2022-02-26 DIAGNOSIS — I739 Peripheral vascular disease, unspecified: Secondary | ICD-10-CM

## 2022-02-26 DIAGNOSIS — Z1211 Encounter for screening for malignant neoplasm of colon: Secondary | ICD-10-CM

## 2022-02-26 DIAGNOSIS — Z113 Encounter for screening for infections with a predominantly sexual mode of transmission: Secondary | ICD-10-CM | POA: Diagnosis not present

## 2022-02-26 DIAGNOSIS — E1169 Type 2 diabetes mellitus with other specified complication: Secondary | ICD-10-CM | POA: Diagnosis not present

## 2022-02-26 DIAGNOSIS — Z0000 Encounter for general adult medical examination without abnormal findings: Secondary | ICD-10-CM

## 2022-02-26 DIAGNOSIS — E785 Hyperlipidemia, unspecified: Secondary | ICD-10-CM

## 2022-02-26 DIAGNOSIS — E1140 Type 2 diabetes mellitus with diabetic neuropathy, unspecified: Secondary | ICD-10-CM | POA: Diagnosis not present

## 2022-02-26 LAB — POCT GLYCOSYLATED HEMOGLOBIN (HGB A1C): Hemoglobin A1C: 7.9 % — AB (ref 4.0–5.6)

## 2022-02-26 LAB — GLUCOSE, CAPILLARY: Glucose-Capillary: 135 mg/dL — ABNORMAL HIGH (ref 70–99)

## 2022-02-26 MED ORDER — PREGABALIN 50 MG PO CAPS
50.0000 mg | ORAL_CAPSULE | Freq: Two times a day (BID) | ORAL | 2 refills | Status: DC
Start: 2022-02-26 — End: 2022-07-27

## 2022-02-26 MED ORDER — DULOXETINE HCL 60 MG PO CPEP: 60.0000 mg | ORAL_CAPSULE | Freq: Every day | ORAL | 3 refills | Status: DC

## 2022-02-26 MED ORDER — LOSARTAN POTASSIUM 50 MG PO TABS: 50.0000 mg | ORAL_TABLET | Freq: Every day | ORAL | 3 refills | Status: DC

## 2022-02-26 MED ORDER — KETOROLAC TROMETHAMINE 30 MG/ML IJ SOLN
30.0000 mg | Freq: Once | INTRAMUSCULAR | Status: AC
  Administered 2022-02-26: 30 mg via INTRAMUSCULAR

## 2022-02-26 MED ORDER — CLOPIDOGREL BISULFATE 75 MG PO TABS: 75.0000 mg | ORAL_TABLET | Freq: Every day | ORAL | 3 refills | Status: DC

## 2022-02-26 NOTE — Patient Instructions (Addendum)
Thank you for coming to the clinic today. Here are a few changes we are making and a few other reminders: ? ?We are increasing your Duloxetine (Cymbalta) to 48m daily. Please take 1 capsule each day. This will help with your neve pain. ?We are restarting Lyrica for your nerve pain. Please take 1 Lyrica 510mcapsule daily. ?Please take your Insulin every day, even if you do not feel like your sugar is running high. This will help your sugar remain at a good level and help your nerve pain. ?We have sent Losartan 5021mo your pharmacy for your blood pressure. Take 1 capsule by mouth 2 times each day. ?We have placed a referral for an eye doctor. ?We are giving you a kit for colon cancer screening. Please follow the directions, and mail it when finished. ?Please return for another visit in 3 months so we can see how your pain is, recheck your A1c, and make sure your medications are working well for you. ? ?We look forward to seeing you again in 3 months! If you have any questions or concerns before then, please contact us![place]

## 2022-02-26 NOTE — Assessment & Plan Note (Addendum)
Patient reports that he takes Xultophy 20 units every other day or when he believes he needs it based on how his body feels. He has worked to reduce sugar in diet with goal of no longer needing medication but has had at least one episode of hyperglycemia and one of hypoglycemia since last visit. A1c 7.9 today. Recommended patient take insulin daily and also discussed how good control can help reduce his nerve pain. Restarting Lyrica '50mg'$ . Placed a referral to ophthalmologist for diabetic eye exam. Discussed need to return in 3 months to check an A1c.  ?

## 2022-02-26 NOTE — Assessment & Plan Note (Addendum)
Plan to check lipids and continue atorvastatin '80mg'$  daily for secondary prevention. ?

## 2022-02-26 NOTE — Assessment & Plan Note (Addendum)
Stable with no claudication. Clopidogrel last ordered in 10/2021. Refill Clopidogrel '75mg'$  daily sent to pharmacy. ?

## 2022-02-26 NOTE — Progress Notes (Signed)
? ?This is a Careers information officer Note.  The care of the patient was discussed with Dr. Lalla Brothers, MD and the assessment and plan was formulated with their assistance.  Please see their note for official documentation of the patient encounter.  ? ?Subjective:  ? ?[name] male   DOB: 1961-10-25 61 y.o.   MRN: [id number] ? ?HPI: ?Donald Palmer is a 61 y.o. with PMHx of chronic lower back pain, HTN, TIIDM, hyperlipidemia, and GERD who presents with nerve pain and requests medication refill. He says the pain began when he was diagnosed with diabetes in 2007, and he has also had a "problem with discs in his spine" but was told he did not need an operation. He feels a sore pain down the back of both of his legs. He says items in back pants pockets cause pain and endorses constant shifting in seated position. He says the pain is bothersome throughout the day and walking, but it is not bothersome at night when he sleeps. His pain has worsened over time but says he has been without a medication he took in the past that helped control pain. He endorses tingling in both feet but denies fever, weight loss, cancer history, and recent accidents.  ? ?He says he knows not taking his insulin and blood pressure medications contributes to the pain. He was unable to pick up his Losartan from the pharmacy. He also says because he knows his body and has had diabetes for a long time, he knows when he needs to inject himself with insulin. He injects every other day or "when he feels he needs it". If he misses more than 2 days, he injects himself with more insulin the next day. He says this regimen works for him. Over the past few months, there was one instance when his sugar was elevated to 278, but he was asymptomatic. He also had only one incident of hypoglycemia with blood sugar of 78, and he had chest pain and went to the hospital. He has 4 recorded sugars from the past month using Accu-Chek, and they are 153 on  01/31/2022, 120 on 02/08/22, 156 on 02/19/2022, and 165 on 02/21/2022. ? ? ? ?Past Medical History:  ?Diagnosis Date  ? Back pain, chronic   ? Diabetes mellitus 2007  ? Hepatitis C   ? [id number]  ? History of chronic hepatitis C 05/13/2012  ? First noted 2013. RF inc tattoos obtained while incarcerated. [place] 2016 SVR 2017   ? Hypertension goal BP (blood pressure) < 140/80   ? Microcytic anemia   ? Neuromuscular disorder ([place])   ? PAD (peripheral artery disease) ([place])   ? Pancreatitis 01/09/2012  ? ?Current Outpatient Medications  ?Medication Sig Dispense Refill  ? pregabalin (LYRICA) 50 MG capsule Take 1 capsule (50 mg total) by mouth 2 (two) times daily. 60 capsule 2  ? Accu-Chek Softclix Lancets lancets check blood sugar 2 times a day 100 each 5  ? atorvastatin (LIPITOR) 80 MG tablet Take 1 tablet (80 mg total) by mouth daily at 6 PM. 90 tablet 3  ? blood glucose meter kit and supplies KIT Use to check glucose twice a day 1 each 0  ? Blood Glucose Monitoring Suppl (ACCU-CHEK GUIDE) w/Device KIT Check blood sugar 2 times a day 1 kit 1  ? clopidogrel (PLAVIX) 75 MG tablet Take 1 tablet (75 mg total) by mouth daily. 90 tablet 3  ? DULoxetine (CYMBALTA) 60 MG capsule Take 1 capsule (  60 mg total) by mouth daily. 90 capsule 3  ? fluticasone (FLONASE) 50 MCG/ACT nasal spray USE 2 SPRAYS IN EACH NOSTRIL DAILY 16 g 11  ? glucose blood (ACCU-CHEK GUIDE) test strip USE TO CHECK GLUCOSE TWICE DAILY 100 strip 5  ? Insulin Degludec-Liraglutide (XULTOPHY) 100-3.6 UNIT-MG/ML SOPN Inject 20 Units into the skin daily. 6 mL 5  ? Insulin Pen Needle (PEN NEEDLES) 32G X 4 MM MISC Use to inject liraglutide once a day 100 each 3  ? Lancet Devices MISC 1 each by Does not apply route 2 (two) times daily. Please use to check blood sugar 2 times daily. diag code E11.40. noninsulin dependent 100 each 11  ? losartan (COZAAR) 50 MG tablet Take 1 tablet (50 mg total) by mouth daily. 90 tablet 3  ? omeprazole (PRILOSEC) 40 MG capsule Take 1  capsule (40 mg total) by mouth daily. 90 capsule 3  ? ?No current facility-administered medications for this visit.  ? ?Family History  ?Problem Relation Age of Onset  ? Diabetes Mother   ? Hypertension Mother   ? Hyperlipidemia Mother   ? Asthma Sister   ? Obesity Sister   ? Diabetes Brother   ? Diabetes Brother   ? Diabetes Daughter   ? Heart attack Neg Hx   ? Sudden death Neg Hx   ? ?Social History  ? ?Socioeconomic History  ? Marital status: Single  ?  Spouse name: Not on file  ? Number of children: 4  ? Years of education: Not on file  ? Highest education level: 12th grade  ?Occupational History  ? Not on file  ?Tobacco Use  ? Smoking status: Some Days  ?  Packs/day: 0.10  ?  Years: 30.00  ?  Pack years: 3.00  ?  Types: Cigarettes, Cigars  ? Smokeless tobacco: Never  ? Tobacco comments:  ?  5 cigs per week  ?Vaping Use  ? Vaping Use: Never used  ?Substance and Sexual Activity  ? Alcohol use: Yes  ?  Alcohol/week: 5.0 standard drinks  ?  Types: 5 Cans of beer per week  ?  Comment: Beer.  ? Drug use: Not Currently  ?  Frequency: 1.0 times per week  ?  Types: Marijuana  ?  Comment: rarely  ? Sexual activity: Yes  ?Other Topics Concern  ? Not on file  ?Social History Narrative  ? Current Social History 08/15/2021    ?   ? Patient lives with his daughter in a home which is 1 story. There are 8 steps up to the entrance the patient uses with a railing  ?   ? Patient's method of transportation is personal car.  ?   ? The highest level of education was some high school.  ?   ? The patient currently disabled.  ?   ? Identified important Relationships are "my daughters and grandkids"   ?   ? Pets : 0  ?    ? Interests / Fun: "playing poker and spades"   ?   ? Current Stressors: "money"   ?   ? Religious / Personal Beliefs: "no"   ?   ? ?Social Determinants of Health  ? ?Financial Resource Strain: Not on file  ?Food Insecurity: Not on file  ?Transportation Needs: Not on file  ?Physical Activity: Not on file  ?Stress: Not on  file  ?Social Connections: Not on file  ? ?Review of Systems: ?Pertinent items are noted in HPI. ?Objective:  ?Physical  Exam: ?Vitals:  ? 02/26/22 0846  ?BP: (!) 149/81  ?Pulse: 99  ?Temp: 98.4 ?F (36.9 ?C)  ?TempSrc: Oral  ?SpO2: 100%  ?Weight: 176 lb 8 oz (80.1 kg)  ?Height: 5' 10"  (1.778 m)  ? ?BP (!) 149/81 (BP Location: Right Arm, Patient Position: Sitting, Cuff Size: Small)   Pulse 99   Temp 98.4 ?F (36.9 ?C) (Oral)   Ht 5' 10"  (1.778 m)   Wt 176 lb 8 oz (80.1 kg)   SpO2 100%   BMI 25.33 kg/m?  ? ?General Appearance:    Alert, cooperative, no distress, uncomfortable in seat and shifting frequently due to pain  ?Back:     No tenderness to palpation  ?Lungs:     Clear to auscultation bilaterally, normal WOB  ?Heart:    Regular rate and rhythm  ?Extremities:   Soreness of posterior BLE  ?Neurologic:   Knee-jerk reflex present bilaterally  ? ?Assessment & Plan:  ? ?See below for problem-based charting. ? ? ?

## 2022-02-26 NOTE — Progress Notes (Signed)
Attestation for Student Documentation: ? ?I personally was present and performed or re-performed the history, physical exam and medical decision-making activities of this service and have verified that the service and findings are accurately documented in the student?s note. ? ?61 year old person here for follow up of chronic low back pain and diabetes. Has been about 5 months since I have seen him, A1c is down to 7.9% but still can make improvement with better adherence to Xultophy. He has a lot of confusion about his medications, and I encouraged him to bring bottles to next visit so we can do better counseling. His low back pain is chronic and unchanged, though at times function limiting, no red flags. I think there is a mix of radiculopathy from Lumbar OA and diabetes. Will treat with increasing duloxetine to '60mg'$  daily and restart Lyrica at '50mg'$  bid which I think he was taking around 2021. I again urged him to follow up with me more regularly, especially if medicines are not satisfactory, so we can make adjustments.  ? ?Axel Filler, MD ?02/26/2022, 11:14 AM ? ?

## 2022-02-26 NOTE — Assessment & Plan Note (Signed)
Patient requested Syphillis testing after labs drawn. RPR added on to today's labs. Will recommend HIV, chlamydia, and gonorrhea testing at next visit. ?

## 2022-02-26 NOTE — Assessment & Plan Note (Addendum)
Patient last had stool testing in 2019. Stool kit was given today. ?

## 2022-02-26 NOTE — Assessment & Plan Note (Addendum)
Patient has chronic right low back pain and associated nerve pain that leads from back into both lower extremities. He says his calf muscles feel sore, and he has tingling in his feet occasionally, but these sensations are multifactorial given he also has history of diabetes and hypertension. He says he has not had the medication that controls his nerve pain but could not remember the name of it. After reviewing chart, it appears patient was taking Lyrica in combination with Cymbalata. Increasing Cymbalta to '60mg'$ , and because patient has not been prescribed Lyrica in 2 years, restarting '50mg'$  dose. He says Flexeril has not helped, so this was discontinued. ?

## 2022-02-26 NOTE — Assessment & Plan Note (Addendum)
BP 149/81 in office today. Patient said he was unable to pick up Losartan when he went to the pharmacy previously, so reorded Losartan '50mg'$  daily and informed patient.  ?

## 2022-02-27 ENCOUNTER — Encounter: Payer: Self-pay | Admitting: Student in an Organized Health Care Education/Training Program

## 2022-02-27 LAB — LIPID PANEL
Chol/HDL Ratio: 3.8 ratio (ref 0.0–5.0)
Cholesterol, Total: 184 mg/dL (ref 100–199)
HDL: 48 mg/dL (ref >39)
LDL Chol Calc (NIH): 101 mg/dL — ABNORMAL HIGH (ref 0–99)
Triglycerides: 203 mg/dL — ABNORMAL HIGH (ref 0–149)
VLDL Cholesterol Cal: 35 mg/dL (ref 5–40)

## 2022-02-27 LAB — RPR: RPR Ser Ql: NONREACTIVE

## 2022-03-20 DIAGNOSIS — E119 Type 2 diabetes mellitus without complications: Secondary | ICD-10-CM | POA: Diagnosis not present

## 2022-03-20 DIAGNOSIS — J329 Chronic sinusitis, unspecified: Secondary | ICD-10-CM | POA: Diagnosis not present

## 2022-03-20 DIAGNOSIS — R202 Paresthesia of skin: Secondary | ICD-10-CM | POA: Diagnosis not present

## 2022-03-20 DIAGNOSIS — I639 Cerebral infarction, unspecified: Secondary | ICD-10-CM | POA: Diagnosis not present

## 2022-03-20 DIAGNOSIS — I6381 Other cerebral infarction due to occlusion or stenosis of small artery: Secondary | ICD-10-CM | POA: Diagnosis not present

## 2022-05-31 ENCOUNTER — Emergency Department (HOSPITAL_COMMUNITY)
Admission: EM | Admit: 2022-05-31 | Discharge: 2022-05-31 | Disposition: A | Discharge status: Home / Self Care | Payer: Medicare Other | Attending: Emergency Medicine | Admitting: Emergency Medicine

## 2022-05-31 ENCOUNTER — Encounter (HOSPITAL_COMMUNITY): Payer: Self-pay

## 2022-05-31 ENCOUNTER — Other Ambulatory Visit: Payer: Self-pay

## 2022-05-31 DIAGNOSIS — E860 Dehydration: Secondary | ICD-10-CM | POA: Diagnosis not present

## 2022-05-31 DIAGNOSIS — M791 Myalgia, unspecified site: Secondary | ICD-10-CM | POA: Insufficient documentation

## 2022-05-31 DIAGNOSIS — R5383 Other fatigue: Secondary | ICD-10-CM

## 2022-05-31 LAB — BASIC METABOLIC PANEL
Anion gap: 8 (ref 5–15)
BUN: 11 mg/dL (ref 8–23)
CO2: 29 mmol/L (ref 22–32)
Calcium: 10 mg/dL (ref 8.9–10.3)
Chloride: 99 mmol/L (ref 98–111)
Creatinine, Ser: 0.94 mg/dL (ref 0.61–1.24)
GFR, Estimated: >60 mL/min (ref >60)
Glucose, Bld: 154 mg/dL — ABNORMAL HIGH (ref 70–99)
Potassium: 4.3 mmol/L (ref 3.5–5.1)
Sodium: 136 mmol/L (ref 135–145)

## 2022-05-31 LAB — CBC WITH DIFFERENTIAL/PLATELET
Abs Immature Granulocytes: 0.03 10*3/uL (ref 0.00–0.07)
Basophils Absolute: 0.1 10*3/uL (ref 0.0–0.1)
Basophils Relative: 1 %
Eosinophils Absolute: 0.1 10*3/uL (ref 0.0–0.5)
Eosinophils Relative: 1 %
HCT: 45.9 % (ref 39.0–52.0)
Hemoglobin: 14.5 g/dL (ref 13.0–17.0)
Immature Granulocytes: 0 %
Lymphocytes Relative: 33 %
Lymphs Abs: 2.4 10*3/uL (ref 0.7–4.0)
MCH: 23 pg — ABNORMAL LOW (ref 26.0–34.0)
MCHC: 31.6 g/dL (ref 30.0–36.0)
MCV: 72.7 fL — ABNORMAL LOW (ref 80.0–100.0)
Monocytes Absolute: 0.5 10*3/uL (ref 0.1–1.0)
Monocytes Relative: 7 %
Neutro Abs: 4.2 10*3/uL (ref 1.7–7.7)
Neutrophils Relative %: 58 %
Platelets: 311 10*3/uL (ref 150–400)
RBC: 6.31 MIL/uL — ABNORMAL HIGH (ref 4.22–5.81)
RDW: 15.5 % (ref 11.5–15.5)
WBC: 7.2 10*3/uL (ref 4.0–10.5)
nRBC: 0 % (ref 0.0–0.2)

## 2022-05-31 LAB — CBG MONITORING, ED: Glucose-Capillary: 146 mg/dL — ABNORMAL HIGH (ref 70–99)

## 2022-05-31 MED ORDER — ONDANSETRON HCL 4 MG/2ML IJ SOLN
4.0000 mg | Freq: Once | INTRAMUSCULAR | Status: AC
  Administered 2022-05-31: 4 mg via INTRAVENOUS
  Filled 2022-05-31: qty 2

## 2022-05-31 MED ORDER — SODIUM CHLORIDE 0.9 % IV BOLUS
1000.0000 mL | Freq: Once | INTRAVENOUS | Status: AC
  Administered 2022-05-31: 1000 mL via INTRAVENOUS

## 2022-05-31 NOTE — ED Notes (Signed)
Patient verbalizes understanding of discharge instructions. Opportunity for questioning and answers were provided. Armband removed by staff, pt discharged from ED. Pt ambulatory to ED waiting room. 

## 2022-05-31 NOTE — ED Notes (Signed)
Called pt for VS x1

## 2022-05-31 NOTE — ED Provider Notes (Signed)
[facility] EMERGENCY DEPARTMENT Provider Note   CSN: [id number] Arrival date & time: 05/31/22  1146     History  Chief Complaint  Patient presents with   Generalized Body Aches    Donald Palmer is a 61 y.o. male.  Patient is a 61 year old male who presents with fatigue.  He said he has been feeling fatigued for the last 2 days.  He feels like he needs IV fluids.  He has had a little nausea and he had 2 episodes of vomiting today.  No other episodes of vomiting.  No diarrhea.  No abdominal pain.  No chest pain or shortness of breath.  He feels a little bit dizzy when he stands up.  No vertiginous type symptoms.  No fevers.  No cough or cold symptoms.  No rashes.  No known tick bites.  No joint or muscle pains.  No urinary symptoms.  He says been drinking Gatorade but he feels like he needs IV fluids.       Home Medications Prior to Admission medications   Medication Sig Start Date End Date Taking? Authorizing Provider  Accu-Chek Softclix Lancets lancets check blood sugar 2 times a day 06/05/21   Mitzi Hansen, MD  atorvastatin (LIPITOR) 80 MG tablet Take 1 tablet (80 mg total) by mouth daily at 6 PM. 06/05/21   Darrick Meigs, Rylee, MD  blood glucose meter kit and supplies KIT Use to check glucose twice a day 10/08/19   [name], MD  Blood Glucose Monitoring Suppl (ACCU-CHEK GUIDE) w/Device KIT Check blood sugar 2 times a day 06/05/21   Mitzi Hansen, MD  clopidogrel (PLAVIX) 75 MG tablet Take 1 tablet (75 mg total) by mouth daily. 02/26/22   Axel Filler, MD  DULoxetine (CYMBALTA) 60 MG capsule Take 1 capsule (60 mg total) by mouth daily. 02/26/22   Axel Filler, MD  fluticasone [facility]) 50 MCG/ACT nasal spray USE 2 SPRAYS IN [facility] NOSTRIL DAILY 06/05/21   Christian, Rylee, MD  glucose blood (ACCU-CHEK GUIDE) test strip USE TO CHECK GLUCOSE TWICE DAILY 06/05/21   Darrick Meigs, Rylee, MD  Insulin Degludec-Liraglutide (XULTOPHY) 100-3.6  UNIT-MG/ML SOPN Inject 20 Units into the skin daily. 10/16/21   Axel Filler, MD  Insulin Pen Needle (PEN NEEDLES) 32G X 4 MM MISC Use to inject liraglutide once a day 06/05/21   Mitzi Hansen, MD  Lancet Devices MISC 1 each by Does not apply route 2 (two) times daily. Please use to check blood sugar 2 times daily. diag code E11.40. noninsulin dependent 06/05/21   Mitzi Hansen, MD  losartan (COZAAR) 50 MG tablet Take 1 tablet (50 mg total) by mouth daily. 02/26/22   Axel Filler, MD  omeprazole (PRILOSEC) 40 MG capsule Take 1 capsule (40 mg total) by mouth daily. 10/23/21   Axel Filler, MD  pregabalin (LYRICA) 50 MG capsule Take 1 capsule (50 mg total) by mouth 2 (two) times daily. 02/26/22   Axel Filler, MD      Allergies    Metformin and related    Review of Systems   Review of Systems  Constitutional:  Positive for fatigue. Negative for chills, diaphoresis and fever.  HENT:  Negative for congestion, rhinorrhea and sneezing.   Eyes: Negative.   Respiratory:  Negative for cough, chest tightness and shortness of breath.   Cardiovascular:  Negative for chest pain and leg swelling.  Gastrointestinal:  Positive for nausea and vomiting. Negative for abdominal pain, blood in stool  and diarrhea.  Genitourinary:  Negative for difficulty urinating, flank pain, frequency and hematuria.  Musculoskeletal:  Negative for arthralgias and back pain.  Skin:  Negative for rash.  Neurological:  Negative for dizziness, speech difficulty, weakness, numbness and headaches.    Physical Exam Updated Vital Signs BP (!) 146/80   Pulse 95   Temp 98.2 F (36.8 C) (Oral)   Resp 14   Ht 5' 10" (1.778 m)   Wt 79.8 kg   SpO2 94%   BMI 25.25 kg/m  Physical Exam Constitutional:      Appearance: He is well-developed.  HENT:     Head: Normocephalic and atraumatic.  Eyes:     Pupils: Pupils are equal, round, and reactive to light.  Cardiovascular:     Rate and  Rhythm: Normal rate and regular rhythm.     Heart sounds: Normal heart sounds.  Pulmonary:     Effort: Pulmonary effort is normal. No respiratory distress.     Breath sounds: Normal breath sounds. No wheezing or rales.  Chest:     Chest wall: No tenderness.  Abdominal:     General: Bowel sounds are normal.     Palpations: Abdomen is soft.     Tenderness: There is no abdominal tenderness. There is no guarding or rebound.  Musculoskeletal:        General: Normal range of motion.     Cervical back: Normal range of motion and neck supple.  Lymphadenopathy:     Cervical: No cervical adenopathy.  Skin:    General: Skin is warm and dry.     Findings: No rash.  Neurological:     General: No focal deficit present.     Mental Status: He is alert and oriented to person, place, and time.     ED Results / Procedures / Treatments   Labs (all labs ordered are listed, but only abnormal results are displayed) Labs Reviewed  CBC WITH DIFFERENTIAL/PLATELET - Abnormal; Notable for the following components:      Result Value   RBC 6.31 (*)    MCV 72.7 (*)    MCH 23.0 (*)    All other components within normal limits  BASIC METABOLIC PANEL - Abnormal; Notable for the following components:   Glucose, Bld 154 (*)    All other components within normal limits  CBG MONITORING, ED - Abnormal; Notable for the following components:   Glucose-Capillary 146 (*)    All other components within normal limits  URINALYSIS, ROUTINE W REFLEX MICROSCOPIC    EKG EKG Interpretation  Date/Time:  Thursday May 31 2022 21:05:49 EDT Ventricular Rate:  71 PR Interval:  180 QRS Duration: 89 QT Interval:  375 QTC Calculation: 408 R Axis:   3 Text Interpretation: Sinus rhythm Probable left atrial enlargement ST elevation, consider anterior injury since last tracing no significant change Confirmed by Belfi, Melanie ([id number]) on 05/31/2022 9:08:00 PM  Radiology No results found.  Procedures Procedures     Medications Ordered in ED Medications  sodium chloride 0.9 % bolus 1,000 mL (0 mLs Intravenous Stopped 05/31/22 2218)  ondansetron (ZOFRAN) injection 4 mg (4 mg Intravenous Given 05/31/22 2113)    ED Course/ Medical Decision Making/ A&P                           Medical Decision Making Problems Addressed: Dehydration: acute illness or injury with systemic symptoms  Amount and/or Complexity of Data Reviewed External Data Reviewed: notes.   Labs: ordered. Decision-making details documented in ED Course. ECG/medicine tests: ordered and independent interpretation performed. Decision-making details documented in ED Course.  Risk Decision regarding hospitalization.   Patient is a 61-year-old male who presents with fatigue.  He is afebrile.  No other symptoms that would be more concerning for ACS.  His EKG is nonconcerning.  He does not have any infectious type symptoms such as pneumonia, COVID or flu.  No abdominal pain.  No ongoing vomiting or diarrhea.  He was given some IV fluids and Zofran here and is feeling 100% better.  In fact he did not feel the need to complete his IV fluids because he felt markedly better and wanted to go home.  He is able to ambulate without fatigue or dizziness.  No ataxia.  No neurologic deficits.  At this point I do not feel that he needs inpatient hospitalization.  He was discharged home in good condition.  Return precautions were given.  Final Clinical Impression(s) / ED Diagnoses Final diagnoses:  Other fatigue  Dehydration    Rx / DC Orders ED Discharge Orders     None         Belfi, Melanie, MD 05/31/22 2230  

## 2022-05-31 NOTE — ED Provider Triage Note (Signed)
Emergency Medicine Provider Triage Evaluation Note  Donald Palmer , a 61 y.o. male  was evaluated in triage.  Pt complains of feeling like he is dehydrated patient states he does not drink on enough water patient has a history of diabetes  Review of Systems  Positive: Donald Palmer Negative: Fever or chills  Physical Exam  BP (!) 145/96 (BP Location: Right Arm)   Pulse 93   Temp 98.3 F (36.8 C) (Oral)   Resp 18   Ht '5\' 10"'$  (1.778 m)   Wt 79.8 kg   SpO2 99%   BMI 25.25 kg/m  Gen:   Awake, no distress   Resp:  Normal effort  MSK:   Moves extremities without difficulty  Other:    Medical Decision Making  Medically screening exam initiated at 12:09 PM.  Appropriate orders placed.  Donald Palmer was informed that the remainder of the evaluation will be completed by another provider, this initial triage assessment does not replace that evaluation, and the importance of remaining in the ED until their evaluation is complete.     Fransico Meadow, [place] 05/31/22 1210

## 2022-05-31 NOTE — ED Triage Notes (Signed)
Reports he has been fighting n/v and generalized body aches x 1 week and cant fight it anymore.  Patient reports he is dehydrated and needs fluids.  Reports he came 3-4 months ago and got the same thing. Patient just keeps stating "I'm weak as shit".

## 2022-05-31 NOTE — ED Notes (Signed)
Called pt no answer x2

## 2022-06-09 ENCOUNTER — Emergency Department (HOSPITAL_BASED_OUTPATIENT_CLINIC_OR_DEPARTMENT_OTHER): Payer: Medicare Other

## 2022-06-09 ENCOUNTER — Emergency Department (HOSPITAL_COMMUNITY)
Admission: EM | Admit: 2022-06-09 | Discharge: 2022-06-09 | Disposition: A | Discharge status: Home / Self Care | Payer: Medicare Other | Attending: Emergency Medicine | Admitting: Emergency Medicine

## 2022-06-09 DIAGNOSIS — Z7902 Long term (current) use of antithrombotics/antiplatelets: Secondary | ICD-10-CM | POA: Diagnosis not present

## 2022-06-09 DIAGNOSIS — I82811 Embolism and thrombosis of superficial veins of right lower extremities: Secondary | ICD-10-CM | POA: Diagnosis not present

## 2022-06-09 DIAGNOSIS — M79609 Pain in unspecified limb: Secondary | ICD-10-CM | POA: Diagnosis not present

## 2022-06-09 DIAGNOSIS — R000 Tachycardia, unspecified: Secondary | ICD-10-CM | POA: Diagnosis not present

## 2022-06-09 DIAGNOSIS — M79604 Pain in right leg: Secondary | ICD-10-CM | POA: Diagnosis present

## 2022-06-09 DIAGNOSIS — Z7901 Long term (current) use of anticoagulants: Secondary | ICD-10-CM | POA: Insufficient documentation

## 2022-06-09 MED ORDER — APIXABAN 5 MG PO TABS: ORAL_TABLET | ORAL | 0 refills | Status: DC

## 2022-06-09 MED ORDER — APIXABAN 5 MG PO TABS
10.0000 mg | ORAL_TABLET | Freq: Once | ORAL | Status: AC
  Administered 2022-06-09: 10 mg via ORAL
  Filled 2022-06-09: qty 2

## 2022-06-09 MED ORDER — ACETAMINOPHEN 500 MG PO TABS
1000.0000 mg | ORAL_TABLET | Freq: Once | ORAL | Status: AC
  Administered 2022-06-09: 1000 mg via ORAL
  Filled 2022-06-09: qty 2

## 2022-06-09 MED ORDER — OXYCODONE HCL 5 MG PO TABS: 10.0000 mg | ORAL_TABLET | ORAL | 0 refills | Status: DC | PRN

## 2022-06-09 NOTE — ED Notes (Signed)
Patient Alert and oriented to baseline. Stable and ambulatory to baseline. Patient verbalized understanding of the discharge instructions.  Patient belongings were taken by the patient.   

## 2022-06-11 ENCOUNTER — Encounter: Payer: Self-pay | Admitting: Student in an Organized Health Care Education/Training Program

## 2022-06-11 ENCOUNTER — Ambulatory Visit (INDEPENDENT_AMBULATORY_CARE_PROVIDER_SITE_OTHER): Payer: Medicare Other | Admitting: Student in an Organized Health Care Education/Training Program

## 2022-06-11 VITALS — BP 146/86 | HR 86 | Temp 98.3°F | Ht 70.0 in | Wt 170.1 lb

## 2022-06-11 DIAGNOSIS — E1151 Type 2 diabetes mellitus with diabetic peripheral angiopathy without gangrene: Secondary | ICD-10-CM | POA: Diagnosis not present

## 2022-06-11 DIAGNOSIS — I82811 Embolism and thrombosis of superficial veins of right lower extremities: Secondary | ICD-10-CM

## 2022-06-11 DIAGNOSIS — Z794 Long term (current) use of insulin: Secondary | ICD-10-CM | POA: Diagnosis not present

## 2022-06-11 DIAGNOSIS — E1140 Type 2 diabetes mellitus with diabetic neuropathy, unspecified: Secondary | ICD-10-CM

## 2022-06-11 DIAGNOSIS — F17210 Nicotine dependence, cigarettes, uncomplicated: Secondary | ICD-10-CM | POA: Diagnosis not present

## 2022-06-11 DIAGNOSIS — Z7901 Long term (current) use of anticoagulants: Secondary | ICD-10-CM | POA: Diagnosis not present

## 2022-06-11 DIAGNOSIS — I739 Peripheral vascular disease, unspecified: Secondary | ICD-10-CM

## 2022-06-11 DIAGNOSIS — I10 Essential (primary) hypertension: Secondary | ICD-10-CM

## 2022-06-11 LAB — POCT GLYCOSYLATED HEMOGLOBIN (HGB A1C): Hemoglobin A1C: 8 % — AB (ref 4.0–5.6)

## 2022-06-11 LAB — GLUCOSE, CAPILLARY: Glucose-Capillary: 218 mg/dL — ABNORMAL HIGH (ref 70–99)

## 2022-06-11 MED ORDER — ATORVASTATIN CALCIUM 80 MG PO TABS: 80.0000 mg | ORAL_TABLET | Freq: Every day | ORAL | 3 refills | Status: DC

## 2022-06-11 MED ORDER — APIXABAN 5 MG PO TABS: 5.0000 mg | ORAL_TABLET | Freq: Two times a day (BID) | ORAL | 0 refills | Status: DC

## 2022-06-11 MED ORDER — CYCLOBENZAPRINE HCL 5 MG PO TABS: 5.0000 mg | ORAL_TABLET | Freq: Every day | ORAL | 2 refills | Status: DC | PRN

## 2022-06-11 NOTE — Assessment & Plan Note (Signed)
Blood pressure above goal today, in the setting of not taking losartan. We discussed and plan to resume taking losartan 50mg  daily.

## 2022-06-11 NOTE — Progress Notes (Signed)
Established Patient Office Visit  Subjective   [name], male    DOB: 1960/12/20  Age: 61 y.o. MRN: [id number]  Chief Complaint  Patient presents with   Diabetes   Leg Pain    HPI  61 year old person living with diabetes here for follow-up of recent emergency department visit.  Was experiencing sudden onset of right posterior lower extremity pain.  Diagnosed with a superficial venous thrombosis below the right knee.  He was started on apixaban in the emergency department and given a 15-day supply of medication.  He is tolerating this medicine without issues with bleeding.  He reports the soreness and tenderness at the calf are somewhat improved though he still is struggling to walk on a consistent basis.  He is requesting a cane and handicap placard which we will help him with.  Reports good adherence with his other medications, with the exception of losartan which she says he has not taken for few weeks.  No fevers or chills.  He reports a history in the past of blood clots, but I think he is referring to the arterial disease that he had an atherectomy for in 2020.  We talked for a while about the nature of arterial and venous disease in the legs and how they are treated differently.    Objective:     BP (!) 146/86 (BP Location: Right Arm, Patient Position: Sitting, Cuff Size: Small)   Pulse 86   Temp 98.3 F (36.8 C) (Oral)   Ht 5\' 10"  (1.778 m)   Wt 170 lb 1.6 oz (77.2 kg)   SpO2 100%   BMI 24.41 kg/m    Physical Exam  General: Well-appearing man, no distress Lungs: Unlabored breathing, clear throughout Heart: Regular rate and rhythm with no murmurs Extremities: Mild varicose veins bilateral lower extremities, trace 1+ pitting edema of the right lower extremity, palpable pulses bilaterally    Assessment & Plan:   Problem List Items Addressed This Visit       High   Type 2 diabetes mellitus with diabetic neuropathy (HCC) - Primary (Chronic)     Hemoglobin A1c under ok control at 8.0%. No hypoglycemia. Has some inconsistency with his medications. Will plan to continue with Xultophy 20 units daily. I reviewed his meter which looked great fasting, the remainder of hyperglycemia is likely post-prandial. If A1c is higher in the future, may add SGLT2 inhibitor.      Relevant Medications   atorvastatin (LIPITOR) 80 MG tablet   Other Relevant Orders   POC Hbg A1C (Completed)   PAD (peripheral artery disease) (HCC) (Chronic)    History of angioplasty and atherectomy in 2020. He has a superficial venous thrombosis currently and we discussed the difference in the arterial and venous disease today. Plan to continue plavix 75mg  daily and atorvastatin 80mg  daily. He will be at slightly higher bleeding risk while on apixaban the next 6 weeks. Otherwise, his pulses are good and it is hard to tell if his leg pain is from the SVT or from claudication currently. Will continue with medical management for now, and follow up on the pain after the SVT resolves.      Relevant Medications   apixaban (ELIQUIS) 5 MG TABS tablet   atorvastatin (LIPITOR) 80 MG tablet   Hypertension (Chronic)    Blood pressure above goal today, in the setting of not taking losartan. We discussed and plan to resume taking losartan 50mg  daily.      Relevant Medications  apixaban (ELIQUIS) 5 MG TABS tablet   atorvastatin (LIPITOR) 80 MG tablet     Unprioritized   Acute superficial venous thrombosis of right lower extremity    Long superficial venous thrombosis diagnosed last week in the ED on doppler ultrasound of the right leg. He has mild palpable varicose veins in both legs. No clear history of venous thombosis in the past. Given the size of the SVT, it is at risk for extending into the deep system, I agree with treating with anticoagulation. Will plan to treat with apixaban 5mg  BID for 6 weeks.       Relevant Medications   apixaban (ELIQUIS) 5 MG TABS tablet    atorvastatin (LIPITOR) 80 MG tablet   Other Relevant Orders   Cane adjustable single point    Return in about 3 months (around 09/11/2022).    Tyson Alias, MD

## 2022-06-11 NOTE — Assessment & Plan Note (Signed)
Long superficial venous thrombosis diagnosed last week in the ED on doppler ultrasound of the right leg. He has mild palpable varicose veins in both legs. No clear history of venous thombosis in the past. Given the size of the SVT, it is at risk for extending into the deep system, I agree with treating with anticoagulation. Will plan to treat with apixaban 5mg  BID for 6 weeks.

## 2022-06-12 DIAGNOSIS — R531 Weakness: Secondary | ICD-10-CM | POA: Diagnosis not present

## 2022-07-27 ENCOUNTER — Other Ambulatory Visit: Payer: Self-pay | Admitting: Student in an Organized Health Care Education/Training Program

## 2022-07-27 MED ORDER — CYCLOBENZAPRINE HCL 5 MG PO TABS: 5.0000 mg | ORAL_TABLET | Freq: Every day | ORAL | 2 refills | Status: DC | PRN

## 2022-07-27 MED ORDER — APIXABAN 5 MG PO TABS: 5.0000 mg | ORAL_TABLET | Freq: Two times a day (BID) | ORAL | 0 refills | Status: DC

## 2022-07-27 MED ORDER — PREGABALIN 50 MG PO CAPS
50.0000 mg | ORAL_CAPSULE | Freq: Two times a day (BID) | ORAL | 2 refills | Status: DC
Start: 2022-07-27 — End: 2023-09-03

## 2022-07-27 NOTE — Telephone Encounter (Signed)
This apixaban refill will complete about 3 months of treatment for a high risk superficial vein thrombosis.

## 2022-07-27 NOTE — Telephone Encounter (Signed)
Oxycodone Last rx written 06/09/22. Last OV 06/11/22. Next OV has not been scheduled. Tox not recently.

## 2022-07-27 NOTE — Telephone Encounter (Signed)
Refill Request  apixaban (ELIQUIS) 5 MG TABS tablet  cyclobenzaprine (FLEXERIL) 5 MG tablet  oxyCODONE (ROXICODONE) 5 MG immediate release tablet  pregabalin (LYRICA) 50 MG capsule  [facility] [phone] - [place], [place] - [place] (Ph: [phone])

## 2022-08-15 ENCOUNTER — Telehealth: Payer: Self-pay

## 2022-08-15 NOTE — Telephone Encounter (Signed)
Lanice Schwab (Key: Garth Schlatter) Rx #: [id number] Xultophy 100/3.6 Units-mg/mL    [employer] Medicare Part D Electronic Prior Authorization Form [phone] NCPDP) Request Reference Number: [id number]. XULTOPHY INJ 100/3.6 is denied for not meeting the prior authorization requirement(s). Details of this decision are in the notice attached below or have been faxed to you.   Xultophy100/3.6 is denied because it is not on your plans drug list (formulary). Medication authorization requires the following: You need to try two (2) of these covered drugs: Bydureon or Byetta Combination of Tresiba and Victoza as separate products Lantus vial, Lantus  SoloStar, [employer] Data processing manager or [employer] Levemir vial Ozempic Soliqua 100/33 Tyler Aas FlexTouch or Rica Koyanagi Trulicity2Victoza  Or you doctor needs to give [place] a specific medal reasons why two of the covered drugs are not appropriate for you.

## 2022-08-15 NOTE — Telephone Encounter (Signed)
Prior Authorization for patient (xultophy) came through on cover my meds was submitted awaiting approval or denial.

## 2022-08-16 MED ORDER — INSULIN DEGLUDEC 100 UNIT/ML ~~LOC~~ SOPN
20.0000 [IU] | PEN_INJECTOR | Freq: Every day | SUBCUTANEOUS | 5 refills | Status: DC
Start: 2022-08-16 — End: 2024-03-04

## 2022-08-16 NOTE — Telephone Encounter (Signed)
Thank you Jada. I called and left a message for the patient to call us back. I need to talk with him about alternative insulin regimen.

## 2022-08-16 NOTE — Telephone Encounter (Signed)
I spoke with the patient by phone. He has a couple of days left on his last Xultophy pen. We talked about the change in his insurance coverage. Will switch to insulin degludec pen by itself, which is covered as [place], 20 units daily. Will hold on GLP1 agonist for now. There has been some confusion around the complexity of his medications in the past and I want to talk with him more about potentially adding a second injection medicine, perhaps a weekly GLP1 agonist.   Can we arrange for him to see me in clinic in late September? We can overbook him on my schedule if needed.

## 2022-08-29 ENCOUNTER — Other Ambulatory Visit: Payer: Self-pay

## 2022-08-29 DIAGNOSIS — E1140 Type 2 diabetes mellitus with diabetic neuropathy, unspecified: Secondary | ICD-10-CM

## 2022-08-29 MED ORDER — ACCU-CHEK GUIDE VI STRP: ORAL_STRIP | 5 refills | Status: DC

## 2022-09-03 ENCOUNTER — Other Ambulatory Visit: Payer: Self-pay

## 2022-09-03 NOTE — Telephone Encounter (Signed)
apixaban (ELIQUIS) 5 MG TABS tablet, REFILL REQUEST @ [id number] - [place], [place] - [address] AT [place].

## 2022-09-03 NOTE — Telephone Encounter (Signed)
Next appt scheduled 9/25 with PCP.

## 2022-09-10 ENCOUNTER — Encounter: Payer: Medicare Other | Admitting: Student in an Organized Health Care Education/Training Program

## 2022-09-10 ENCOUNTER — Encounter: Payer: Self-pay | Admitting: Student in an Organized Health Care Education/Training Program

## 2022-10-31 DIAGNOSIS — T162XXA Foreign body in left ear, initial encounter: Secondary | ICD-10-CM | POA: Diagnosis not present

## 2022-10-31 DIAGNOSIS — E1165 Type 2 diabetes mellitus with hyperglycemia: Secondary | ICD-10-CM | POA: Diagnosis not present

## 2022-10-31 DIAGNOSIS — F17210 Nicotine dependence, cigarettes, uncomplicated: Secondary | ICD-10-CM | POA: Diagnosis not present

## 2022-10-31 DIAGNOSIS — Z86718 Personal history of other venous thrombosis and embolism: Secondary | ICD-10-CM | POA: Diagnosis not present

## 2022-11-13 DIAGNOSIS — E1165 Type 2 diabetes mellitus with hyperglycemia: Secondary | ICD-10-CM | POA: Diagnosis not present

## 2022-11-13 DIAGNOSIS — Z0000 Encounter for general adult medical examination without abnormal findings: Secondary | ICD-10-CM | POA: Diagnosis not present

## 2022-11-13 DIAGNOSIS — B351 Tinea unguium: Secondary | ICD-10-CM | POA: Diagnosis not present

## 2022-11-13 DIAGNOSIS — K257 Chronic gastric ulcer without hemorrhage or perforation: Secondary | ICD-10-CM | POA: Diagnosis not present

## 2022-11-13 DIAGNOSIS — M62838 Other muscle spasm: Secondary | ICD-10-CM | POA: Diagnosis not present

## 2022-11-13 DIAGNOSIS — E785 Hyperlipidemia, unspecified: Secondary | ICD-10-CM | POA: Diagnosis not present

## 2022-11-13 DIAGNOSIS — Z86718 Personal history of other venous thrombosis and embolism: Secondary | ICD-10-CM | POA: Diagnosis not present

## 2022-11-27 DIAGNOSIS — J309 Allergic rhinitis, unspecified: Secondary | ICD-10-CM | POA: Diagnosis not present

## 2022-11-27 DIAGNOSIS — E1165 Type 2 diabetes mellitus with hyperglycemia: Secondary | ICD-10-CM | POA: Diagnosis not present

## 2022-11-27 DIAGNOSIS — E785 Hyperlipidemia, unspecified: Secondary | ICD-10-CM | POA: Diagnosis not present

## 2022-12-12 DIAGNOSIS — Z1212 Encounter for screening for malignant neoplasm of rectum: Secondary | ICD-10-CM | POA: Diagnosis not present

## 2022-12-12 DIAGNOSIS — Z1211 Encounter for screening for malignant neoplasm of colon: Secondary | ICD-10-CM | POA: Diagnosis not present

## 2023-08-22 ENCOUNTER — Other Ambulatory Visit: Payer: Self-pay | Admitting: *Deleted

## 2023-08-22 DIAGNOSIS — I739 Peripheral vascular disease, unspecified: Secondary | ICD-10-CM

## 2023-08-28 ENCOUNTER — Ambulatory Visit (INDEPENDENT_AMBULATORY_CARE_PROVIDER_SITE_OTHER)
Admission: RE | Admit: 2023-08-28 | Discharge: 2023-08-28 | Disposition: A | Discharge status: Home / Self Care | Payer: 59 | Source: Ambulatory Visit | Attending: Vascular Surgery

## 2023-08-28 ENCOUNTER — Encounter: Payer: Self-pay | Admitting: Vascular Surgery

## 2023-08-28 ENCOUNTER — Ambulatory Visit (HOSPITAL_COMMUNITY)
Admission: RE | Admit: 2023-08-28 | Discharge: 2023-08-28 | Disposition: A | Discharge status: Home / Self Care | Payer: 59 | Source: Ambulatory Visit | Attending: Vascular Surgery | Admitting: Vascular Surgery

## 2023-08-28 ENCOUNTER — Ambulatory Visit (INDEPENDENT_AMBULATORY_CARE_PROVIDER_SITE_OTHER): Payer: 59 | Admitting: Vascular Surgery

## 2023-08-28 VITALS — BP 125/83 | HR 87 | Temp 98.1°F | Resp 20 | Ht 70.0 in | Wt 185.0 lb

## 2023-08-28 DIAGNOSIS — I70222 Atherosclerosis of native arteries of extremities with rest pain, left leg: Secondary | ICD-10-CM | POA: Diagnosis not present

## 2023-08-28 DIAGNOSIS — I739 Peripheral vascular disease, unspecified: Secondary | ICD-10-CM | POA: Diagnosis present

## 2023-08-28 LAB — VAS US ABI WITH/WO TBI
Left ABI: 0.55
Right ABI: 0.96

## 2023-08-28 NOTE — Progress Notes (Signed)
[name], male   DOB: 06-10-1961, 62 y.o.   MRN: [id number]  Reason for Consult: New Patient (Initial Visit)   Referred by No ref. provider found  Subjective:     HPI:  Donald Palmer is a 62 y.o. male 62 year old male with history of left lower extremity intervention for pain.  He states that the pain did resolve after that.  He continues to smoke about 1 pack of cigarettes per week.  He has diabetes and hypertension as well.  He takes Eliquis he is unsure why.  He walks with the help of a cane he denies tissue loss or ulceration.  He states for a couple months he has had pain in the left greater than the right lower extremity and this affects him both when he is walking or his legs cramp below the knee and also at rest particularly at night he has pain in the left foot not as much the right foot.  He states that this is worse than prior to his previous procedures.  He has never had lower extremity surgery.  He also endorses erectile dysfunction.  Past Medical History:  Diagnosis Date   Back pain, chronic    Diabetes mellitus 2007   Hepatitis C    [id number]   History of chronic hepatitis C 05/13/2012   First noted 2013. RF inc tattoos obtained while incarcerated. [place] 2016 SVR 2017    Hypertension goal BP (blood pressure) < 140/80    Microcytic anemia    Neuromuscular disorder (HCC)    PAD (peripheral artery disease) (HCC)    Pancreatitis 01/09/2012   Family History  Problem Relation Age of Onset   Diabetes Mother    Hypertension Mother    Hyperlipidemia Mother    Asthma Sister    Obesity Sister    Diabetes Brother    Diabetes Brother    Diabetes Daughter    Heart attack Neg Hx    Sudden death Neg Hx    Past Surgical History:  Procedure Laterality Date   ABDOMINAL AORTOGRAM N/A 03/03/2018   Procedure: ABDOMINAL AORTOGRAM;  Surgeon: Runell Gess, MD;  Location: [facility];  Service: Cardiovascular;  Laterality: N/A;   ABDOMINAL AORTOGRAM W/LOWER  EXTREMITY N/A 06/15/2019   Procedure: ABDOMINAL AORTOGRAM W/LOWER EXTREMITY;  Surgeon: Runell Gess, MD;  Location: [facility];  Service: Cardiovascular;  Laterality: N/A;   I & D EXTREMITY Right 05/15/2016   Procedure: RIGHT INDEX FINGER FIRST THROUGH  MIDDLE PHALYNX  AMPUTATION;  Surgeon: Mack Hook, MD;  Location: [facility] OR;  Service: Orthopedics;  Laterality: Right;   LOWER EXTREMITY INTERVENTION Bilateral 03/03/2018   Procedure: LOWER EXTREMITY INTERVENTION;  Surgeon: Runell Gess, MD;  Location: [facility];  Service: Cardiovascular;  Laterality: Bilateral;   OPEN REDUCTION INTERNAL FIXATION (ORIF) DISTAL PHALANX Right 04/02/2016   Procedure: RIGHT INDEX FINGER REPAIR;  Surgeon: Mack Hook, MD;  Location: [facility];  Service: Orthopedics;  Laterality: Right;   PERIPHERAL VASCULAR ATHERECTOMY Left 03/03/2018   Procedure: PERIPHERAL VASCULAR ATHERECTOMY;  Surgeon: Runell Gess, MD;  Location: [facility];  Service: Cardiovascular;  Laterality: Left;  SFA WITH PTA DRUG COATED BALLOON   PERIPHERAL VASCULAR INTERVENTION Left 06/15/2019   Procedure: PERIPHERAL VASCULAR INTERVENTION;  Surgeon: Runell Gess, MD;  Location: [facility];  Service: Cardiovascular;  Laterality: Left;    Short Social History:  Social History   Tobacco Use  Smoking status: Some Days    Current packs/day: 0.10    Average packs/day: 0.1 packs/day for 30.0 years (3.0 ttl pk-yrs)    Types: Cigarettes, Cigars   Smokeless tobacco: Never   Tobacco comments:    5 cigs per week  Substance Use Topics   Alcohol use: Yes    Alcohol/week: 5.0 standard drinks of alcohol    Types: 5 Cans of beer per week    Comment: Beer.    Allergies  Allergen Reactions   Metformin And Related Other (See Comments)    GI side effects. Stopped 2016    Current Outpatient Medications  Medication Sig Dispense Refill   Accu-Chek Softclix Lancets lancets check blood sugar 2  times a day 100 each 5   apixaban (ELIQUIS) 5 MG TABS tablet Take 1 tablet (5 mg total) by mouth 2 (two) times daily. 60 tablet 0   atorvastatin (LIPITOR) 80 MG tablet Take 1 tablet (80 mg total) by mouth daily at 6 PM. 90 tablet 3   blood glucose meter kit and supplies KIT Use to check glucose twice a day 1 each 0   Blood Glucose Monitoring Suppl (ACCU-CHEK GUIDE) w/Device KIT Check blood sugar 2 times a day 1 kit 1   clopidogrel (PLAVIX) 75 MG tablet Take 1 tablet (75 mg total) by mouth daily. 90 tablet 3   cyclobenzaprine (FLEXERIL) 5 MG tablet Take 1 tablet (5 mg total) by mouth daily as needed for muscle spasms. 30 tablet 2   DULoxetine (CYMBALTA) 60 MG capsule Take 1 capsule (60 mg total) by mouth daily. 90 capsule 3   fluticasone (FLONASE) 50 MCG/ACT nasal spray USE 2 SPRAYS IN EACH NOSTRIL DAILY 16 g 11   glucose blood (ACCU-CHEK GUIDE) test strip USE TO CHECK GLUCOSE TWICE DAILY 100 strip 5   insulin degludec (TRESIBA) 100 UNIT/ML FlexTouch Pen Inject 20 Units into the skin daily. 6 mL 5   Insulin Pen Needle (PEN NEEDLES) 32G X 4 MM MISC Use to inject liraglutide once a day 100 each 3   Lancet Devices MISC 1 each by Does not apply route 2 (two) times daily. Please use to check blood sugar 2 times daily. diag code E11.40. noninsulin dependent 100 each 11   losartan (COZAAR) 50 MG tablet Take 1 tablet (50 mg total) by mouth daily. 90 tablet 3   omeprazole (PRILOSEC) 40 MG capsule Take 1 capsule (40 mg total) by mouth daily. 90 capsule 3   oxyCODONE (ROXICODONE) 5 MG immediate release tablet Take 2 tablets (10 mg total) by mouth every 4 (four) hours as needed for severe pain. 30 tablet 0   pregabalin (LYRICA) 50 MG capsule Take 1 capsule (50 mg total) by mouth 2 (two) times daily. 60 capsule 2   No current facility-administered medications for this visit.    Review of Systems  Constitutional:  Constitutional negative. HENT: HENT negative.  Eyes: Eyes negative.  Respiratory: Respiratory  negative.  Cardiovascular: Positive for leg swelling.  GI: Gastrointestinal negative.  GU:       Erectile dysfunction Musculoskeletal: Positive for leg pain.  Skin: Skin negative.  Neurological: Neurological negative. Hematologic: Hematologic/lymphatic negative.  Psychiatric: Psychiatric negative.        Objective:  Objective   Vitals:   08/28/23 1345  BP: 125/83  Pulse: 87  Resp: 20  Temp: 98.1 F (36.7 C)  SpO2: 97%  Weight: 185 lb (83.9 kg)  Height: 5\' 10"  (1.778 m)   Body mass index is 26.54 kg/m.  Physical Exam HENT:     Head: Normocephalic.     Nose: Nose normal.     Mouth/Throat:     Mouth: Mucous membranes are moist.  Eyes:     Pupils: Pupils are equal, round, and reactive to light.  Cardiovascular:     Rate and Rhythm: Normal rate.     Pulses:          Femoral pulses are 2+ on the right side and 2+ on the left side.      Popliteal pulses are 0 on the right side and 0 on the left side.       Dorsalis pedis pulses are 0 on the right side and 0 on the left side.       Posterior tibial pulses are 0 on the right side and 0 on the left side.  Pulmonary:     Effort: Pulmonary effort is normal.  Abdominal:     General: Abdomen is flat.  Musculoskeletal:     Right lower leg: No edema.     Left lower leg: No edema.  Skin:    General: Skin is warm.     Capillary Refill: Capillary refill takes less than 2 seconds.  Neurological:     General: No focal deficit present.     Mental Status: He is alert.  Psychiatric:        Mood and Affect: Mood normal.        Thought Content: Thought content normal.        Judgment: Judgment normal.     Data: ABI Findings:  +---------+------------------+-----+----------+--------+  Right   Rt Pressure (mmHg)IndexWaveform  Comment   +---------+------------------+-----+----------+--------+  Brachial 142                                        +---------+------------------+-----+----------+--------+  PTA      137               0.96 triphasic           +---------+------------------+-----+----------+--------+  DP      86                0.61 monophasic          +---------+------------------+-----+----------+--------+  Great Toe65                0.46                     +---------+------------------+-----+----------+--------+   +---------+------------------+-----+----------+-------+  Left    Lt Pressure (mmHg)IndexWaveform  Comment  +---------+------------------+-----+----------+-------+  Brachial 137                                       +---------+------------------+-----+----------+-------+  PTA     75                0.53 monophasic         +---------+------------------+-----+----------+-------+  DP      78                0.55 monophasic         +---------+------------------+-----+----------+-------+  Great Toe28                0.20                    +---------+------------------+-----+----------+-------+   +-------+-----------+-----------+------------+------------+  ABI/TBIToday's ABIToday's TBIPrevious ABIPrevious TBI  +-------+-----------+-----------+------------+------------+  Right 0.96       0.46       1.05        0.8           +-------+-----------+-----------+------------+------------+  Left  0.55       0.2        1.05        0.74          +-------+-----------+-----------+------------+------------+        Previous ABI on 01/18/20 at [facility].  Pedal pressures may be falsely elevated.    Summary:  Right: Resting right ankle-brachial index is within normal range. The  right toe-brachial index is abnormal.   Left: Resting left ankle-brachial index indicates moderate left lower  extremity arterial disease. The left toe-brachial index is abnormal.   +----------+--------+-----+---------------+----------+------------------+  LEFT     PSV cm/sRatioStenosis       Waveform  Comments             +----------+--------+-----+---------------+----------+------------------+  CFA Distal354          50-74% stenosistriphasic                     +----------+--------+-----+---------------+----------+------------------+  DFA      196          30-49% stenosistriphasic                     +----------+--------+-----+---------------+----------+------------------+  SFA Prox  211          50-74% stenosisbiphasic                      +----------+--------+-----+---------------+----------+------------------+  SFA Mid   281                         monophasicoccludes mid thigh  +----------+--------+-----+---------------+----------+------------------+  SFA Distal5                                                         +----------+--------+-----+---------------+----------+------------------+  POP Prox  38                          monophasic                    +----------+--------+-----+---------------+----------+------------------+  POP Mid   20                          monophasic                    +----------+--------+-----+---------------+----------+------------------+  ATA Distal58                          monophasic                    +----------+--------+-----+---------------+----------+------------------+  PTA Distal49                          monophasic                    +----------+--------+-----+---------------+----------+------------------+     Summary:  Left: Total occlusion noted in the mid superficial femoral artery  with  reconstitution in the popliteal artery.  50-74% stenosis in the common femoral artery.      Assessment/Plan:    62 year old male with recurrent rest pain left lower extremity with stenosis of the left common femoral artery and proximal SFA with occlusion of the distal SFA and apparent reconstitution of the popliteal artery.  I discussed with him the with rest pain and a toe pressure of 28 that he is at some  risk although not high risk of having major amputation in the future particularly if he is not quit smoking entirely.  We discussed the need for angiography from the right common femoral approach possible intervention possible need for surgical intervention.  He demonstrates good understanding we will get him scheduled on a Monday in the near future.  Donald Palmer has atherosclerosis of the native arteries of the Left lower extremities causing ischemic rest pain. The patient is on best medical therapy for peripheral arterial disease. The patient has been counseled about the risks of tobacco use in atherosclerotic disease. The patient has been counseled to abstain from any tobacco use. An aortogram with bilateral lower extremity runoff angiography and Left lower extremity intervention and is indicated to better evaluate the patient's lower extremity circulation because of the limb threatening nature of the patient's diagnosis. Based on the patient's clinical exam and non-invasive data, we anticipate an endovascular intervention in the femoropopliteal vessels. Stenting and/or athrectomy would be favored because of the improved primary patency of these interventions as compared to plain balloon angioplasty.      Maeola Harman MD [facility]

## 2023-08-28 NOTE — H&P (View-Only) (Signed)
[name], male   DOB: 06-10-1961, 62 y.o.   MRN: [id number]  Reason for Consult: New Patient (Initial Visit)   Referred by No ref. provider found  Subjective:     HPI:  Donald Palmer is a 62 y.o. male 62 year old male with history of left lower extremity intervention for pain.  He states that the pain did resolve after that.  He continues to smoke about 1 pack of cigarettes per week.  He has diabetes and hypertension as well.  He takes Eliquis he is unsure why.  He walks with the help of a cane he denies tissue loss or ulceration.  He states for a couple months he has had pain in the left greater than the right lower extremity and this affects him both when he is walking or his legs cramp below the knee and also at rest particularly at night he has pain in the left foot not as much the right foot.  He states that this is worse than prior to his previous procedures.  He has never had lower extremity surgery.  He also endorses erectile dysfunction.  Past Medical History:  Diagnosis Date   Back pain, chronic    Diabetes mellitus 2007   Hepatitis C    [id number]   History of chronic hepatitis C 05/13/2012   First noted 2013. RF inc tattoos obtained while incarcerated. Harvoni 2016 SVR 2017    Hypertension goal BP (blood pressure) < 140/80    Microcytic anemia    Neuromuscular disorder (HCC)    PAD (peripheral artery disease) (HCC)    Pancreatitis 01/09/2012   Family History  Problem Relation Age of Onset   Diabetes Mother    Hypertension Mother    Hyperlipidemia Mother    Asthma Sister    Obesity Sister    Diabetes Brother    Diabetes Brother    Diabetes Daughter    Heart attack Neg Hx    Sudden death Neg Hx    Past Surgical History:  Procedure Laterality Date   ABDOMINAL AORTOGRAM N/A 03/03/2018   Procedure: ABDOMINAL AORTOGRAM;  Surgeon: Runell Gess, MD;  Location: [facility];  Service: Cardiovascular;  Laterality: N/A;   ABDOMINAL AORTOGRAM W/LOWER  EXTREMITY N/A 06/15/2019   Procedure: ABDOMINAL AORTOGRAM W/LOWER EXTREMITY;  Surgeon: Runell Gess, MD;  Location: [facility];  Service: Cardiovascular;  Laterality: N/A;   I & D EXTREMITY Right 05/15/2016   Procedure: RIGHT INDEX FINGER FIRST THROUGH  MIDDLE PHALYNX  AMPUTATION;  Surgeon: Mack Hook, MD;  Location: [facility] OR;  Service: Orthopedics;  Laterality: Right;   LOWER EXTREMITY INTERVENTION Bilateral 03/03/2018   Procedure: LOWER EXTREMITY INTERVENTION;  Surgeon: Runell Gess, MD;  Location: [facility];  Service: Cardiovascular;  Laterality: Bilateral;   OPEN REDUCTION INTERNAL FIXATION (ORIF) DISTAL PHALANX Right 04/02/2016   Procedure: RIGHT INDEX FINGER REPAIR;  Surgeon: Mack Hook, MD;  Location: [facility];  Service: Orthopedics;  Laterality: Right;   PERIPHERAL VASCULAR ATHERECTOMY Left 03/03/2018   Procedure: PERIPHERAL VASCULAR ATHERECTOMY;  Surgeon: Runell Gess, MD;  Location: [facility];  Service: Cardiovascular;  Laterality: Left;  SFA WITH PTA DRUG COATED BALLOON   PERIPHERAL VASCULAR INTERVENTION Left 06/15/2019   Procedure: PERIPHERAL VASCULAR INTERVENTION;  Surgeon: Runell Gess, MD;  Location: [facility];  Service: Cardiovascular;  Laterality: Left;    Short Social History:  Social History   Tobacco Use  Smoking status: Some Days    Current packs/day: 0.10    Average packs/day: 0.1 packs/day for 30.0 years (3.0 ttl pk-yrs)    Types: Cigarettes, Cigars   Smokeless tobacco: Never   Tobacco comments:    5 cigs per week  Substance Use Topics   Alcohol use: Yes    Alcohol/week: 5.0 standard drinks of alcohol    Types: 5 Cans of beer per week    Comment: Beer.    Allergies  Allergen Reactions   Metformin And Related Other (See Comments)    GI side effects. Stopped 2016    Current Outpatient Medications  Medication Sig Dispense Refill   Accu-Chek Softclix Lancets lancets check blood sugar 2  times a day 100 each 5   apixaban (ELIQUIS) 5 MG TABS tablet Take 1 tablet (5 mg total) by mouth 2 (two) times daily. 60 tablet 0   atorvastatin (LIPITOR) 80 MG tablet Take 1 tablet (80 mg total) by mouth daily at 6 PM. 90 tablet 3   blood glucose meter kit and supplies KIT Use to check glucose twice a day 1 each 0   Blood Glucose Monitoring Suppl (ACCU-CHEK GUIDE) w/Device KIT Check blood sugar 2 times a day 1 kit 1   clopidogrel (PLAVIX) 75 MG tablet Take 1 tablet (75 mg total) by mouth daily. 90 tablet 3   cyclobenzaprine (FLEXERIL) 5 MG tablet Take 1 tablet (5 mg total) by mouth daily as needed for muscle spasms. 30 tablet 2   DULoxetine (CYMBALTA) 60 MG capsule Take 1 capsule (60 mg total) by mouth daily. 90 capsule 3   fluticasone (FLONASE) 50 MCG/ACT nasal spray USE 2 SPRAYS IN EACH NOSTRIL DAILY 16 g 11   glucose blood (ACCU-CHEK GUIDE) test strip USE TO CHECK GLUCOSE TWICE DAILY 100 strip 5   insulin degludec (TRESIBA) 100 UNIT/ML FlexTouch Pen Inject 20 Units into the skin daily. 6 mL 5   Insulin Pen Needle (PEN NEEDLES) 32G X 4 MM MISC Use to inject liraglutide once a day 100 each 3   Lancet Devices MISC 1 each by Does not apply route 2 (two) times daily. Please use to check blood sugar 2 times daily. diag code E11.40. noninsulin dependent 100 each 11   losartan (COZAAR) 50 MG tablet Take 1 tablet (50 mg total) by mouth daily. 90 tablet 3   omeprazole (PRILOSEC) 40 MG capsule Take 1 capsule (40 mg total) by mouth daily. 90 capsule 3   oxyCODONE (ROXICODONE) 5 MG immediate release tablet Take 2 tablets (10 mg total) by mouth every 4 (four) hours as needed for severe pain. 30 tablet 0   pregabalin (LYRICA) 50 MG capsule Take 1 capsule (50 mg total) by mouth 2 (two) times daily. 60 capsule 2   No current facility-administered medications for this visit.    Review of Systems  Constitutional:  Constitutional negative. HENT: HENT negative.  Eyes: Eyes negative.  Respiratory: Respiratory  negative.  Cardiovascular: Positive for leg swelling.  GI: Gastrointestinal negative.  GU:       Erectile dysfunction Musculoskeletal: Positive for leg pain.  Skin: Skin negative.  Neurological: Neurological negative. Hematologic: Hematologic/lymphatic negative.  Psychiatric: Psychiatric negative.        Objective:  Objective   Vitals:   08/28/23 1345  BP: 125/83  Pulse: 87  Resp: 20  Temp: 98.1 F (36.7 C)  SpO2: 97%  Weight: 185 lb (83.9 kg)  Height: 5\' 10"  (1.778 m)   Body mass index is 26.54 kg/m.  Physical Exam HENT:     Head: Normocephalic.     Nose: Nose normal.     Mouth/Throat:     Mouth: Mucous membranes are moist.  Eyes:     Pupils: Pupils are equal, round, and reactive to light.  Cardiovascular:     Rate and Rhythm: Normal rate.     Pulses:          Femoral pulses are 2+ on the right side and 2+ on the left side.      Popliteal pulses are 0 on the right side and 0 on the left side.       Dorsalis pedis pulses are 0 on the right side and 0 on the left side.       Posterior tibial pulses are 0 on the right side and 0 on the left side.  Pulmonary:     Effort: Pulmonary effort is normal.  Abdominal:     General: Abdomen is flat.  Musculoskeletal:     Right lower leg: No edema.     Left lower leg: No edema.  Skin:    General: Skin is warm.     Capillary Refill: Capillary refill takes less than 2 seconds.  Neurological:     General: No focal deficit present.     Mental Status: He is alert.  Psychiatric:        Mood and Affect: Mood normal.        Thought Content: Thought content normal.        Judgment: Judgment normal.     Data: ABI Findings:  +---------+------------------+-----+----------+--------+  Right   Rt Pressure (mmHg)IndexWaveform  Comment   +---------+------------------+-----+----------+--------+  Brachial 142                                        +---------+------------------+-----+----------+--------+  PTA      137               0.96 triphasic           +---------+------------------+-----+----------+--------+  DP      86                0.61 monophasic          +---------+------------------+-----+----------+--------+  Great Toe65                0.46                     +---------+------------------+-----+----------+--------+   +---------+------------------+-----+----------+-------+  Left    Lt Pressure (mmHg)IndexWaveform  Comment  +---------+------------------+-----+----------+-------+  Brachial 137                                       +---------+------------------+-----+----------+-------+  PTA     75                0.53 monophasic         +---------+------------------+-----+----------+-------+  DP      78                0.55 monophasic         +---------+------------------+-----+----------+-------+  Great Toe28                0.20                    +---------+------------------+-----+----------+-------+   +-------+-----------+-----------+------------+------------+  ABI/TBIToday's ABIToday's TBIPrevious ABIPrevious TBI  +-------+-----------+-----------+------------+------------+  Right 0.96       0.46       1.05        0.8           +-------+-----------+-----------+------------+------------+  Left  0.55       0.2        1.05        0.74          +-------+-----------+-----------+------------+------------+        Previous ABI on 01/18/20 at [facility].  Pedal pressures may be falsely elevated.    Summary:  Right: Resting right ankle-brachial index is within normal range. The  right toe-brachial index is abnormal.   Left: Resting left ankle-brachial index indicates moderate left lower  extremity arterial disease. The left toe-brachial index is abnormal.   +----------+--------+-----+---------------+----------+------------------+  LEFT     PSV cm/sRatioStenosis       Waveform  Comments             +----------+--------+-----+---------------+----------+------------------+  CFA Distal354          50-74% stenosistriphasic                     +----------+--------+-----+---------------+----------+------------------+  DFA      196          30-49% stenosistriphasic                     +----------+--------+-----+---------------+----------+------------------+  SFA Prox  211          50-74% stenosisbiphasic                      +----------+--------+-----+---------------+----------+------------------+  SFA Mid   281                         monophasicoccludes mid thigh  +----------+--------+-----+---------------+----------+------------------+  SFA Distal5                                                         +----------+--------+-----+---------------+----------+------------------+  POP Prox  38                          monophasic                    +----------+--------+-----+---------------+----------+------------------+  POP Mid   20                          monophasic                    +----------+--------+-----+---------------+----------+------------------+  ATA Distal58                          monophasic                    +----------+--------+-----+---------------+----------+------------------+  PTA Distal49                          monophasic                    +----------+--------+-----+---------------+----------+------------------+     Summary:  Left: Total occlusion noted in the mid superficial femoral artery  with  reconstitution in the popliteal artery.  50-74% stenosis in the common femoral artery.      Assessment/Plan:    62 year old male with recurrent rest pain left lower extremity with stenosis of the left common femoral artery and proximal SFA with occlusion of the distal SFA and apparent reconstitution of the popliteal artery.  I discussed with him the with rest pain and a toe pressure of 28 that he is at some  risk although not high risk of having major amputation in the future particularly if he is not quit smoking entirely.  We discussed the need for angiography from the right common femoral approach possible intervention possible need for surgical intervention.  He demonstrates good understanding we will get him scheduled on a Monday in the near future.  Donald Palmer has atherosclerosis of the native arteries of the Left lower extremities causing ischemic rest pain. The patient is on best medical therapy for peripheral arterial disease. The patient has been counseled about the risks of tobacco use in atherosclerotic disease. The patient has been counseled to abstain from any tobacco use. An aortogram with bilateral lower extremity runoff angiography and Left lower extremity intervention and is indicated to better evaluate the patient's lower extremity circulation because of the limb threatening nature of the patient's diagnosis. Based on the patient's clinical exam and non-invasive data, we anticipate an endovascular intervention in the femoropopliteal vessels. Stenting and/or athrectomy would be favored because of the improved primary patency of these interventions as compared to plain balloon angioplasty.      Maeola Harman MD [facility]

## 2023-08-30 ENCOUNTER — Other Ambulatory Visit: Payer: Self-pay

## 2023-08-30 DIAGNOSIS — I70222 Atherosclerosis of native arteries of extremities with rest pain, left leg: Secondary | ICD-10-CM

## 2023-09-09 ENCOUNTER — Other Ambulatory Visit: Payer: Self-pay

## 2023-09-09 ENCOUNTER — Telehealth: Payer: Self-pay | Admitting: Cardiology

## 2023-09-09 ENCOUNTER — Ambulatory Visit (HOSPITAL_COMMUNITY)
Admission: RE | Admit: 2023-09-09 | Discharge: 2023-09-09 | Disposition: A | Discharge status: Home / Self Care | Payer: 59 | Attending: Vascular Surgery | Admitting: Vascular Surgery

## 2023-09-09 ENCOUNTER — Ambulatory Visit (HOSPITAL_BASED_OUTPATIENT_CLINIC_OR_DEPARTMENT_OTHER)
Admission: RE | Admit: 2023-09-09 | Discharge: 2023-09-09 | Disposition: A | Discharge status: Home / Self Care | Payer: 59 | Source: Home / Self Care | Attending: Vascular Surgery | Admitting: Vascular Surgery

## 2023-09-09 ENCOUNTER — Ambulatory Visit (HOSPITAL_COMMUNITY)
Admission: RE | Disposition: A | Discharge status: Home / Self Care | Payer: Self-pay | Source: Home / Self Care | Attending: Vascular Surgery

## 2023-09-09 DIAGNOSIS — Z8619 Personal history of other infectious and parasitic diseases: Secondary | ICD-10-CM | POA: Insufficient documentation

## 2023-09-09 DIAGNOSIS — N529 Male erectile dysfunction, unspecified: Secondary | ICD-10-CM | POA: Insufficient documentation

## 2023-09-09 DIAGNOSIS — Z7901 Long term (current) use of anticoagulants: Secondary | ICD-10-CM | POA: Insufficient documentation

## 2023-09-09 DIAGNOSIS — Z794 Long term (current) use of insulin: Secondary | ICD-10-CM | POA: Diagnosis not present

## 2023-09-09 DIAGNOSIS — I7090 Unspecified atherosclerosis: Secondary | ICD-10-CM

## 2023-09-09 DIAGNOSIS — F17210 Nicotine dependence, cigarettes, uncomplicated: Secondary | ICD-10-CM | POA: Insufficient documentation

## 2023-09-09 DIAGNOSIS — Z7985 Long-term (current) use of injectable non-insulin antidiabetic drugs: Secondary | ICD-10-CM | POA: Diagnosis not present

## 2023-09-09 DIAGNOSIS — I70222 Atherosclerosis of native arteries of extremities with rest pain, left leg: Secondary | ICD-10-CM

## 2023-09-09 DIAGNOSIS — Z8249 Family history of ischemic heart disease and other diseases of the circulatory system: Secondary | ICD-10-CM | POA: Diagnosis not present

## 2023-09-09 DIAGNOSIS — E1151 Type 2 diabetes mellitus with diabetic peripheral angiopathy without gangrene: Secondary | ICD-10-CM | POA: Diagnosis not present

## 2023-09-09 DIAGNOSIS — Z833 Family history of diabetes mellitus: Secondary | ICD-10-CM | POA: Diagnosis not present

## 2023-09-09 DIAGNOSIS — I10 Essential (primary) hypertension: Secondary | ICD-10-CM | POA: Insufficient documentation

## 2023-09-09 HISTORY — PX: ABDOMINAL AORTOGRAM W/LOWER EXTREMITY: CATH118223

## 2023-09-09 LAB — POCT I-STAT, CHEM 8
BUN: 13 mg/dL (ref 8–23)
Calcium, Ion: 1.28 mmol/L (ref 1.15–1.40)
Chloride: 104 mmol/L (ref 98–111)
Creatinine, Ser: 1.1 mg/dL (ref 0.61–1.24)
Glucose, Bld: 140 mg/dL — ABNORMAL HIGH (ref 70–99)
HCT: 45 % (ref 39.0–52.0)
Hemoglobin: 15.3 g/dL (ref 13.0–17.0)
Potassium: 4.9 mmol/L (ref 3.5–5.1)
Sodium: 140 mmol/L (ref 135–145)
TCO2: 27 mmol/L (ref 22–32)

## 2023-09-09 LAB — GLUCOSE, CAPILLARY: Glucose-Capillary: 96 mg/dL (ref 70–99)

## 2023-09-09 SURGERY — ABDOMINAL AORTOGRAM W/LOWER EXTREMITY
Anesthesia: LOCAL

## 2023-09-09 MED ORDER — FENTANYL CITRATE (PF) 100 MCG/2ML IJ SOLN
INTRAMUSCULAR | Status: DC | PRN
  Administered 2023-09-09: 50 ug via INTRAVENOUS

## 2023-09-09 MED ORDER — SODIUM CHLORIDE 0.9 % WEIGHT BASED INFUSION: 1.0000 mL/kg/h | INTRAVENOUS | Status: DC

## 2023-09-09 MED ORDER — LIDOCAINE HCL (PF) 1 % IJ SOLN
INTRAMUSCULAR | Status: AC
  Filled 2023-09-09: qty 30

## 2023-09-09 MED ORDER — IODIXANOL 320 MG/ML IV SOLN
INTRAVENOUS | Status: DC | PRN
  Administered 2023-09-09: 80 mL via INTRA_ARTERIAL

## 2023-09-09 MED ORDER — ONDANSETRON HCL 4 MG/2ML IJ SOLN: 4.0000 mg | Freq: Four times a day (QID) | INTRAMUSCULAR | Status: DC | PRN

## 2023-09-09 MED ORDER — MIDAZOLAM HCL 2 MG/2ML IJ SOLN
INTRAMUSCULAR | Status: AC
  Filled 2023-09-09: qty 2

## 2023-09-09 MED ORDER — SODIUM CHLORIDE 0.9% FLUSH: 3.0000 mL | Freq: Two times a day (BID) | INTRAVENOUS | Status: DC

## 2023-09-09 MED ORDER — HEPARIN (PORCINE) IN NACL 1000-0.9 UT/500ML-% IV SOLN
INTRAVENOUS | Status: DC | PRN
  Administered 2023-09-09 (×2): 500 mL

## 2023-09-09 MED ORDER — SODIUM CHLORIDE 0.9 % IV SOLN: 250.0000 mL | INTRAVENOUS | Status: DC | PRN

## 2023-09-09 MED ORDER — SODIUM CHLORIDE 0.9 % IV SOLN: INTRAVENOUS | Status: DC

## 2023-09-09 MED ORDER — MIDAZOLAM HCL 2 MG/2ML IJ SOLN
INTRAMUSCULAR | Status: DC | PRN
  Administered 2023-09-09: 1 mg via INTRAVENOUS

## 2023-09-09 MED ORDER — ACETAMINOPHEN 325 MG PO TABS: 650.0000 mg | ORAL_TABLET | ORAL | Status: DC | PRN

## 2023-09-09 MED ORDER — LABETALOL HCL 5 MG/ML IV SOLN: 10.0000 mg | INTRAVENOUS | Status: DC | PRN

## 2023-09-09 MED ORDER — OXYCODONE HCL 5 MG PO TABS: 5.0000 mg | ORAL_TABLET | ORAL | Status: DC | PRN

## 2023-09-09 MED ORDER — FENTANYL CITRATE (PF) 100 MCG/2ML IJ SOLN
INTRAMUSCULAR | Status: AC
  Filled 2023-09-09: qty 2

## 2023-09-09 MED ORDER — MORPHINE SULFATE (PF) 2 MG/ML IV SOLN: 2.0000 mg | INTRAVENOUS | Status: DC | PRN

## 2023-09-09 MED ORDER — HYDRALAZINE HCL 20 MG/ML IJ SOLN: 5.0000 mg | INTRAMUSCULAR | Status: DC | PRN

## 2023-09-09 MED ORDER — SODIUM CHLORIDE 0.9% FLUSH: 3.0000 mL | INTRAVENOUS | Status: DC | PRN

## 2023-09-09 SURGICAL SUPPLY — 12 items
CATH OMNI FLUSH 5F 65CM (CATHETERS) IMPLANT
CLOSURE MYNX CONTROL 5F (Vascular Products) IMPLANT
COVER DOME SNAP 22 D (MISCELLANEOUS) IMPLANT
GUIDEWIRE ANGLED .035X150CM (WIRE) IMPLANT
KIT MICROPUNCTURE NIT STIFF (SHEATH) IMPLANT
PROTECTION STATION PRESSURIZED (MISCELLANEOUS) ×1
SET ATX-X65L (MISCELLANEOUS) IMPLANT
SHEATH PINNACLE 5F 10CM (SHEATH) IMPLANT
SHEATH PROBE COVER 6X72 (BAG) IMPLANT
STATION PROTECTION PRESSURIZED (MISCELLANEOUS) IMPLANT
TRAY PV CATH (CUSTOM PROCEDURE TRAY) ×1 IMPLANT
WIRE BENTSON .035X145CM (WIRE) IMPLANT

## 2023-09-09 NOTE — Progress Notes (Signed)
Patient walked to the bathroom, right groin site level 0, clean, dry, and intact

## 2023-09-09 NOTE — Discharge Instructions (Signed)
Femoral Site Care This sheet gives you information about how to care for yourself after your procedure. Your health care provider may also give you more specific instructions. If you have problems or questions, contact your health care provider. What can I expect after the procedure?  After the procedure, it is common to have: Bruising that usually fades within 1-2 weeks. Tenderness at the site. Follow these instructions at home: Wound care Follow instructions from your health care provider about how to take care of your insertion site. Make sure you: Wash your hands with soap and water before you change your bandage (dressing). If soap and water are not available, use hand sanitizer. Remove your dressing as told by your health care provider. In 24 hours Do not take baths, swim, or use a hot tub until your health care provider approves. You may shower 24-48 hours after the procedure or as told by your health care provider. Gently wash the site with plain soap and water. Pat the area dry with a clean towel. Do not rub the site. This may cause bleeding. Do not apply powder or lotion to the site. Keep the site clean and dry. Check your femoral site every day for signs of infection. Check for: Redness, swelling, or pain. Fluid or blood. Warmth. Pus or a bad smell. Activity For the first 2-3 days after your procedure, or as long as directed: Avoid climbing stairs as much as possible. Do not squat. Do not lift anything that is heavier than 10 lb (4.5 kg), or the limit that you are told, until your health care provider says that it is safe. For 5 days Rest as directed. Avoid sitting for a long time without moving. Get up to take short walks every 1-2 hours. Do not drive for 24 hours if you were given a medicine to help you relax (sedative). General instructions Take over-the-counter and prescription medicines only as told by your health care provider. Keep all follow-up visits as told by  your health care provider. This is important. Contact a health care provider if you have: A fever or chills. You have redness, swelling, or pain around your insertion site. Get help right away if: The catheter insertion area swells very fast. You pass out. You suddenly start to sweat or your skin gets clammy. The catheter insertion area is bleeding, and the bleeding does not stop when you hold steady pressure on the area. The area near or just beyond the catheter insertion site becomes pale, cool, tingly, or numb. These symptoms may represent a serious problem that is an emergency. Do not wait to see if the symptoms will go away. Get medical help right away. Call your local emergency services (911 in the [place] Do not drive yourself to the hospital. Summary After the procedure, it is common to have bruising that usually fades within 1-2 weeks. Check your femoral site every day for signs of infection. Do not lift anything that is heavier than 10 lb (4.5 kg), or the limit that you are told, until your health care provider says that it is safe. This information is not intended to replace advice given to you by your health care provider. Make sure you discuss any questions you have with your health care provider. Document Revised: 12/16/2017 Document Reviewed: 12/16/2017 Elsevier Patient Education  2020 [employer] Inc[employer] 

## 2023-09-09 NOTE — Telephone Encounter (Signed)
Name: Donald Palmer  DOB: Nov 05, 1961  MRN: [id number]  Primary Cardiologist: None  Chart reviewed as part of pre-operative protocol coverage. Because of Micaiah Schuhmacher Nauert's past medical history and time since last visit, he will require a follow-up in-office visit in order to better assess preoperative cardiovascular risk.  Patient has an office visit scheduled on 09/17/2023 with Dr. Rosemary Holms. Appointment notes have been updated to reflect need for pre-op evaluation.   Pre-op covering staff:  - Please contact requesting surgeon's office via preferred method (i.e, phone, fax) to inform them of need for appointment prior to surgery.   Carlos Levering, NP  09/09/2023, 4:39 PM

## 2023-09-09 NOTE — Interval H&P Note (Signed)
History and Physical Interval Note:  09/09/2023 8:53 AM  Donald Palmer  has presented today for surgery, with the diagnosis of Atherosclerosis of native artery of left lower extremity with rest pain.  The various methods of treatment have been discussed with the patient and family. After consideration of risks, benefits and other options for treatment, the patient has consented to  Procedure(s): ABDOMINAL AORTOGRAM W/LOWER EXTREMITY (N/A) as a surgical intervention.  The patient's history has been reviewed, patient examined, no change in status, stable for surgery.  I have reviewed the patient's chart and labs.  Questions were answered to the patient's satisfaction.     [name]

## 2023-09-09 NOTE — Op Note (Signed)
Patient name: Donald Palmer MRN: [id number] DOB: 05-Apr-1961 Sex: male  09/09/2023 Pre-operative Diagnosis: Atherosclerosis of native arteries with left lower extremity rest pain Post-operative diagnosis:  Same Surgeon:  Luanna Salk. Randie Heinz, MD Procedure Performed: 1.  Percutaneous ultrasound-guided access and Mynx device closure right common femoral artery 2.  Catheter aorta milligram bilateral lower extremity angiography 3.  Selection of left common femoral artery 4.  Moderate sedation with fentanyl and Versed for 24 minutes  Indications: 63 year old male with history of left SFA intervention now with evidence of SFA occlusion by duplex and moderately depressed ABI with severely depressed toe pressures and rest pain.  He is indicated for angiography with possible intervention.  Findings: The aorta and iliac segments have calcium however there is no evidence of flow-limiting stenosis of bilateral hypogastric arteries are patent.  On the right side the common femoral artery is minimally diseased the SFA has multiple areas of mild stenosis and 1 area of 80% stenosis and he has three-vessel runoff dominant via the posterior tibial artery on the right.  The left side is the site of interest.  The common femoral was at least 50% stenosed with a decreased femoral pulse on the left.  The SFA frankly occludes after a couple centimeters and reconstitutes above the knee with runoff dominant via the posterior tibial but all 3 vessels appear to make it to the ankle.  Given that patient has had previous intervention that has now failed we will plan for left common femoral endarterectomy with femoral to likely above-knee popliteal artery bypass.  Vein mapping will be performed today likely can bypass with PTFE.   Procedure:  The patient was identified in the holding area and taken to room 8.  The patient was then placed supine on the table and prepped and draped in the usual sterile fashion.  A time out was called.   Ultrasound was used to evaluate the right common femoral artery.  This was posteriorly diseased but was amenable for cannulation.  The area was anesthetized 1% lidocaine and cannulated with a micropuncture needle followed by wire and a sheath.  An ultrasound images saved the permanent record.  Concomitantly we administered fentanyl and Versed as moderate sedation and his vital signs were monitored by bedside nursing throughout the case.  We placed a Bentson wire followed by a 5 French sheath and Omni catheter to the level of L1 and performed aortogram followed by pelvic angiography.  We then crossed the bifurcation with Omni catheter and Glidewire placed this in the left common femoral artery perform left lower extremity angiography with the above findings and will plan for bypass.  The catheter was then removed over a wire and right lower extremity angiography performed through the sheath.  A minx device was then placed.  Patient tolerated procedure without any complication.   Contrast: 80 cc  [name] C. Randie Heinz, MD [facility] [place] [facility] Office: [phone] Pager: [phone]

## 2023-09-09 NOTE — Telephone Encounter (Signed)
Pre-operative Risk Assessment    Patient Name: Donald Palmer  DOB: 12/26/1960 MRN: [id number]      Request for Surgical Clearance    Procedure:   Left Comma Femoral 2 Popliteal Artery Bypass  Date of Surgery:  Clearance TBD                                 Surgeon:  Dr. Devota Pace Group or Practice Name:  [facility] Phone number:  [phone] Fax number:  [phone]   Type of Clearance Requested:   - Medical    Type of Anesthesia:  General    Additional requests/questions:   [facility] would like medical Clearance for the patient. Patient is scheduled for 10/01.  Cathey Endow   09/09/2023, 2:56 PM

## 2023-09-10 ENCOUNTER — Encounter (HOSPITAL_COMMUNITY): Payer: Self-pay | Admitting: Vascular Surgery

## 2023-09-10 MED FILL — Lidocaine HCl Local Preservative Free (PF) Inj 1%: INTRAMUSCULAR | Qty: 30 | Status: AC

## 2023-09-10 NOTE — Telephone Encounter (Signed)
We don't follow with pt, he hasn't been seen by [facility] since 2020. Eliquis was prescribed for DVT in 2023, recommend clearance come from managing provider.

## 2023-09-10 NOTE — Telephone Encounter (Signed)
We don't follow with pt, he hasn't been seen by [facility] since 2020. Eliquis was prescribed for DVT in 2023, recommend clearance come from managing provider.   Pt has a new patient appt with Dr. Rosemary Holms on 09/17/23. He will address preop at that time and communicate with the requesting party. I will remove from preop box.

## 2023-09-10 NOTE — Telephone Encounter (Signed)
Noted ? ?Thanks ?MJP ? ?

## 2023-09-11 ENCOUNTER — Telehealth: Payer: Self-pay

## 2023-09-11 NOTE — Telephone Encounter (Signed)
Pt called asking if it was ok for him to take a walk. He stated he was having no pain.  Reviewed pt's chart, returned call for clarification, two identifiers used. Informed pt that walking was beneficial, as long as he doesn't over-extend beyond what he would normally do. He said that he was going to walk to the store approx 10 min each way and he'd done that walk plenty of times. Gave him the ok to walk. Confirmed understanding.

## 2023-09-17 ENCOUNTER — Encounter: Payer: Self-pay | Admitting: Cardiology

## 2023-09-17 ENCOUNTER — Ambulatory Visit: Payer: 59 | Attending: Cardiology | Admitting: Cardiology

## 2023-09-17 VITALS — BP 138/82 | HR 80 | Ht 69.0 in | Wt 190.0 lb

## 2023-09-17 DIAGNOSIS — Z01810 Encounter for preprocedural cardiovascular examination: Secondary | ICD-10-CM

## 2023-09-17 DIAGNOSIS — I739 Peripheral vascular disease, unspecified: Secondary | ICD-10-CM

## 2023-09-17 DIAGNOSIS — I10 Essential (primary) hypertension: Secondary | ICD-10-CM | POA: Diagnosis not present

## 2023-09-17 NOTE — Patient Instructions (Addendum)
Medication Instructions:   *If you need a refill on your cardiac medications before your next appointment, please call your pharmacy*   Lab Work: Lipid today   If you have labs (blood work) drawn today and your tests are completely normal, you will receive your results only by: MyChart Message (if you have MyChart) OR A paper copy in the mail If you have any lab test that is abnormal or we need to change your treatment, we will call you to review the results.   Testing/Procedures: Your physician has requested that you have a lexiscan myoview. For further information please visit [email or web address] Please follow instruction sheet, as given.  Your physician has requested that you have an echocardiogram. Echocardiography is a painless test that uses sound waves to create images of your heart. It provides your doctor with information about the size and shape of your heart and how well your heart's chambers and valves are working. This procedure takes approximately one hour. There are no restrictions for this procedure. Please do NOT wear cologne, perfume, aftershave, or lotions (deodorant is allowed). Please arrive 15 minutes prior to your appointment time.   Follow-Up: At [facility], you and your health needs are our priority.  As part of our continuing mission to provide you with exceptional heart care, we have created designated Provider Care Teams.  These Care Teams include your primary Cardiologist (physician) and Advanced Practice Providers (APPs -  Physician Assistants and Nurse Practitioners) who all work together to provide you with the care you need, when you need it.  We recommend signing up for the patient portal called "MyChart".  Sign up information is provided on this After Visit Summary.  MyChart is used to connect with patients for Virtual Visits (Telemedicine).  Patients are able to view lab/test results, encounter notes, upcoming appointments, etc.  Non-urgent  messages can be sent to your provider as well.   To learn more about what you can do with MyChart, go to [email or web address].    Your next appointment:   Dr Rosemary Holms    Other Instructions

## 2023-09-17 NOTE — Progress Notes (Addendum)
Cardiology Office Note:  .   Date:  09/17/2023  ID:  Donald Palmer, DOB [birth year], MRN [id number] PCP: Patient, No Pcp Per  [facility] Providers Cardiologist:  Truett Mainland, MD PCP: No PCP per patient   History of Present Illness: .    62 year old African-American male with hypertension, hyperlipidemia, PAD  Patient was last seen by Dr. Allyson Sabal in 06/2019.  He previously underwent injection atherectomy and DCB PTCA to left popliteal artery for symptoms of claudication. More recently, he underwent peripheral angiography by Dr. Randie Heinz that showed 50% left common femoral artery disease, followed by occlusion of left SFA within prior popliteal stent, with reconstitution of three-vessel runoff just above the knee level.  Dr. Randie Heinz is going to perform left common femoral endarterectomy with potential femoral to above-knee popliteal artery bypass.  He is here with his wife. His walking is significantly limited due to lifestyle limiting claudication, which limits ability to assess for exertional chest pain.  Patient is on Eliquis. It appears that he had acute superficial vein thrombosis in small saphenous vein in 05/2022, but do not see any prior evidence of DVT. She is also on Plavix for PAD.  Vitals:   09/17/23 1400  BP: 138/82  Pulse: 80  SpO2: 97%     ROS:  Review of Systems  Cardiovascular:  Positive for claudication. Negative for chest pain, dyspnea on exertion, leg swelling, palpitations and syncope.     Studies Reviewed: Marland Kitchen   EKG Interpretation Date/Time:  Tuesday September 17 2023 13:58:34 EDT Ventricular Rate:  77 PR Interval:  200 QRS Duration:  86 QT Interval:  352 QTC Calculation: 398 R Axis:   -23  Text Interpretation: Normal sinus rhythm Moderate voltage criteria for LVH, may be normal variant ( R in aVL , Cornell product ) Inferior infarct (cited on or before 09-Sep-2023) When compared with ECG of 17-Sep-2023 13:57, QRS axis Shifted right Questionable change in  initial forces of Inferior leads Confirmed by Truett Mainland [phone]) on 09/17/2023 2:02:17 PM     Physical Exam:   Physical Exam Vitals and nursing note reviewed.  Constitutional:      General: He is not in acute distress. Neck:     Vascular: No JVD.  Cardiovascular:     Rate and Rhythm: Normal rate and regular rhythm.     Pulses:          Dorsalis pedis pulses are 1+ on the right side and 1+ on the left side.       Posterior tibial pulses are 0 on the right side and 0 on the left side.     Heart sounds: Normal heart sounds. No murmur heard. Pulmonary:     Effort: Pulmonary effort is normal.     Breath sounds: Normal breath sounds. No wheezing or rales.  Musculoskeletal:     Right lower leg: No edema.     Left lower leg: No edema.      VISIT DIAGNOSES:   ICD-10-CM   1. PAD (peripheral artery disease) (HCC)  I73.9 EKG 12-Lead    Myocardial Perfusion Imaging    Lipid Profile    ECHOCARDIOGRAM COMPLETE    Cardiac Stress Test: Informed Consent Details: Physician/Practitioner Attestation; Transcribe to consent form and obtain patient signature    2. Preop cardiovascular exam  Z01.810 Cardiac Stress Test: Informed Consent Details: Physician/Practitioner Attestation; Transcribe to consent form and obtain patient signature    3. Primary hypertension  I10 Cardiac Stress Test: Informed Consent Details: Physician/Practitioner Attestation;  Transcribe to consent form and obtain patient signature       ASSESSMENT AND PLAN: .    62 year old African-American male with hypertension, hyperlipidemia, PAD  Pre-op evaluation: Limited functional capacity due to claudication. Therefore, unable to assess for symptoms of exertional angina or dyspnea. Recommend echocardiogram, Lexiscan nuclear stress test for cardiac risk stratification prior to vascular surgery. He is currently on Eliquis. I am not clear of the indication. Per my review he had a superficial vein thrombosis in 05/2022. But no  DVT. If no other known etiology, Eliquis can be stopped 2 days before upcoming PAD surgery, and may not be resumed. I defer post-op antiplatelet therapy to Dr. Randie Heinz.  PAD: Management as per Dr. Randie Heinz. Continue atorvastatin 80 mg daily. Check lipid panel.  Informed Consent   Shared Decision Making/Informed Consent The risks [chest pain, shortness of breath, cardiac arrhythmias, dizziness, blood pressure fluctuations, myocardial infarction, stroke/transient ischemic attack, nausea, vomiting, allergic reaction, radiation exposure, metallic taste sensation and life-threatening complications (estimated to be 1 in 10,000)], benefits (risk stratification, diagnosing coronary artery disease, treatment guidance) and alternatives of a nuclear stress test were discussed in detail with Mr. Rotunno and he agrees to proceed.         F/u in 6 months  Signed, Elder Negus, MD

## 2023-09-19 ENCOUNTER — Telehealth (HOSPITAL_COMMUNITY): Payer: Self-pay | Admitting: *Deleted

## 2023-09-19 NOTE — Telephone Encounter (Signed)
Unable to reach patient regarding his STRESS TEST on 09/23/23 because his voicemail box is full.

## 2023-09-23 ENCOUNTER — Encounter (HOSPITAL_COMMUNITY): Payer: 59

## 2023-10-01 ENCOUNTER — Ambulatory Visit (HOSPITAL_COMMUNITY): Payer: Medicare HMO

## 2023-10-02 NOTE — Telephone Encounter (Signed)
Patient given detailed instructions per Myocardial Perfusion Study Information Sheet for the test on 10/07/2023 at 10:45. Patient notified to arrive 15 minutes early and that it is imperative to arrive on time for appointment to keep from having the test rescheduled.  If you need to cancel or reschedule your appointment, please call the office within 24 hours of your appointment. . Patient verbalized understanding.Daneil Dolin

## 2023-10-07 ENCOUNTER — Ambulatory Visit (HOSPITAL_COMMUNITY): Payer: Medicare HMO | Attending: Cardiology

## 2023-10-07 ENCOUNTER — Telehealth: Payer: Self-pay

## 2023-10-07 DIAGNOSIS — E109 Type 1 diabetes mellitus without complications: Secondary | ICD-10-CM | POA: Diagnosis not present

## 2023-10-07 DIAGNOSIS — Z136 Encounter for screening for cardiovascular disorders: Secondary | ICD-10-CM | POA: Diagnosis present

## 2023-10-07 DIAGNOSIS — R9439 Abnormal result of other cardiovascular function study: Secondary | ICD-10-CM

## 2023-10-07 DIAGNOSIS — I739 Peripheral vascular disease, unspecified: Secondary | ICD-10-CM | POA: Diagnosis not present

## 2023-10-07 DIAGNOSIS — I10 Essential (primary) hypertension: Secondary | ICD-10-CM | POA: Diagnosis not present

## 2023-10-07 DIAGNOSIS — Z794 Long term (current) use of insulin: Secondary | ICD-10-CM | POA: Diagnosis not present

## 2023-10-07 DIAGNOSIS — Z01810 Encounter for preprocedural cardiovascular examination: Secondary | ICD-10-CM | POA: Diagnosis not present

## 2023-10-07 LAB — MYOCARDIAL PERFUSION IMAGING
Estimated workload: 1
Exercise duration (min): 1 min
Exercise duration (sec): 0 s
LV dias vol: 119 mL (ref 62–150)
LV sys vol: 65 mL
Nuc Stress EF: 46 %
Peak HR: 100 {beats}/min
Rest HR: 74 {beats}/min
Rest Nuclear Isotope Dose: 8.5 mCi
SDS: 4
SRS: 6
SSS: 12
ST Depression (mm): 0 mm
Stress Nuclear Isotope Dose: 28.1 mCi
TID: 1.1

## 2023-10-07 MED ORDER — REGADENOSON 0.4 MG/5ML IV SOLN
0.4000 mg | Freq: Once | INTRAVENOUS | Status: AC
  Administered 2023-10-07: 0.4 mg via INTRAVENOUS

## 2023-10-07 MED ORDER — TECHNETIUM TC 99M TETROFOSMIN IV KIT
8.5000 | PACK | Freq: Once | INTRAVENOUS | Status: AC | PRN
  Administered 2023-10-07: 8.5 via INTRAVENOUS

## 2023-10-07 MED ORDER — TECHNETIUM TC 99M TETROFOSMIN IV KIT
28.1000 | PACK | Freq: Once | INTRAVENOUS | Status: AC | PRN
  Administered 2023-10-07: 28.1 via INTRAVENOUS

## 2023-10-07 NOTE — Telephone Encounter (Signed)
Patient called in with questions regarding clearance for upcoming surgery with Dr. Randie Heinz. Returned patient's call, no answer, LVM.

## 2023-10-07 NOTE — Telephone Encounter (Signed)
Pt called in asking if he still needs to have his echo 10/22/23 or can he go ahead and be cleared for surgery. Please advise.

## 2023-10-07 NOTE — Telephone Encounter (Signed)
I will forward to pre op APP to review if echo needed or if Dr. Rosemary Holms cleared the pt on 09/17/23 appt.

## 2023-10-08 ENCOUNTER — Other Ambulatory Visit: Payer: Self-pay

## 2023-10-08 DIAGNOSIS — I70222 Atherosclerosis of native arteries of extremities with rest pain, left leg: Secondary | ICD-10-CM

## 2023-10-10 ENCOUNTER — Encounter: Payer: Self-pay | Admitting: *Deleted

## 2023-10-10 ENCOUNTER — Telehealth: Payer: Self-pay | Admitting: *Deleted

## 2023-10-10 DIAGNOSIS — Z01812 Encounter for preprocedural laboratory examination: Secondary | ICD-10-CM

## 2023-10-10 DIAGNOSIS — Z01818 Encounter for other preprocedural examination: Secondary | ICD-10-CM

## 2023-10-10 DIAGNOSIS — I739 Peripheral vascular disease, unspecified: Secondary | ICD-10-CM

## 2023-10-10 DIAGNOSIS — I70222 Atherosclerosis of native arteries of extremities with rest pain, left leg: Secondary | ICD-10-CM

## 2023-10-10 DIAGNOSIS — R9439 Abnormal result of other cardiovascular function study: Secondary | ICD-10-CM

## 2023-10-10 DIAGNOSIS — Z01810 Encounter for preprocedural cardiovascular examination: Secondary | ICD-10-CM

## 2023-10-10 NOTE — Telephone Encounter (Signed)
-----   Message from Nurse Kinnie Feil C sent at 10/10/2023 12:30 PM EDT -----  ----- Message ----- From: Elder Negus, MD Sent: 10/10/2023  12:09 PM EDT To: [address] Triage  If patient is agreeable to recommendations below, can schedule for heart catheterization with me.  Message to the patient: Stress test shows possibility of prior heart injury, but no evidence of ischemia (lack of blood supply to heart muscle and stress).  That said, which her risk factors, I do think there is value in evaluating your heart arteries before upcoming surgery for your leg circulation.  Most likely, this will be a diagnostic procedure to assess your cardiac risk of undergoing bypass surgery for legs.  If I find any significant narrowing, I will then discuss with Dr. Randie Heinz regarding further recommendations.  Heart catheterization risks" Risks include but are not limited to bleeding, infection, vascular injury, stroke, myocardial infection, arrhythmia, kidney injury, radiation-related injury in the case of prolonged fluoroscopy use, emergency cardiac surgery, and death. The patient understands the risks of serious complication is 1-2 in 1000 with diagnostic cardiac cath and 1-2% or less with angioplasty/stenting.   Thanks MJP

## 2023-10-10 NOTE — Telephone Encounter (Signed)
Patient will need to await echo prior to being cleared for surgery.  Levi Aland, NP-C 10/10/2023, 7:47 AM [address] Office [phone] Fax [phone]

## 2023-10-10 NOTE — Telephone Encounter (Signed)
The patient has been notified of the result and verbalized understanding.  All questions (if any) were answered.  Pts LHC is scheduled with Dr. Rosemary Holms for next [facility] 10/30 at 1130, pt to be there at 0930.  Pt will come for pre-procedure labs (check BMET/CBC W-DIFF) on tomorrow 10/11/23 here in the office.   Pt aware he will need to pick up his cath instruction letter at the front desk tomorrow when he comes for his lab work.  Pt verbalized understanding and agrees with this plan.  Will send a staff message to Dr. Rosemary Holms to place hospital orders and to pre-cert to pre-cert his cath for next Wednesday 10/30 at 1130.      [facility] A DEPT OF [place]. [facility] AT [facility] [address] [place], [place] [address] [place] [address] Dept: [phone] Loc: [phone]  MACKSON SENA  10/10/2023  You are scheduled for a Cardiac Catheterization on Wednesday, October 30 with Dr.  Rosemary Holms .  1. Please arrive at the [facility] (Main Entrance A) at [facility]: [address] [place], [place] [address] at 9:30 AM (This time is 2 hour(s) before your procedure to ensure your preparation). Free valet parking service is available. You will check in at ADMITTING. The support person will be asked to wait in the waiting room.  It is OK to have someone drop you off and come back when you are ready to be discharged.    Special note: Every effort is made to have your procedure done on time. Please understand that emergencies sometimes delay scheduled procedures.  2. Diet: Do not eat solid foods after midnight.  The patient may have clear liquids until 5am upon the day of the procedure.  3. Labs: You will need to have blood drawn on Friday, October 25 at [facility] at [facility]. [address], [place]  Open: 7:30am - 5pm    Phone: [phone]. You do not need to be fasting.  4. Medication instructions in  preparation for your procedure:   Contrast Allergy: No   Stop taking Eliquis (Apixiban) on Monday, October 28.  Stop taking, Cozaar (Losartan) Tuesday, October 29,  Take only 25 units of insulin the night before your procedure. Do not take any insulin on the day of the procedure.  Do not take Diabetes Med PIOGLITAZONE (ACTOS) on the day of the procedure and HOLD 48 HOURS AFTER THE PROCEDURE.  On the morning of your procedure, take your Plavix/Clopidogrel and any morning medicines NOT listed above.  You may use sips of water.  5. Plan to go home the same day, you will only stay overnight if medically necessary. 6. Bring a current list of your medications and current insurance cards. 7. You MUST have a responsible person to drive you home. 8. Someone MUST be with you the first 24 hours after you arrive home or your discharge will be delayed. 9. Please wear clothes that are easy to get on and off and wear slip-on shoes.  Thank you for allowing [place] to care for you!   -- [place] [facility]

## 2023-10-10 NOTE — Telephone Encounter (Signed)
Pt has been made aware that he will need to get his Echocardiogram before he can be cleared for surgery.   He verbalized understanding.

## 2023-10-10 NOTE — Progress Notes (Signed)
If patient is agreeable to recommendations below, can schedule for heart catheterization with me.  Message to the patient: Stress test shows possibility of prior heart injury, but no evidence of ischemia (lack of blood supply to heart muscle and stress).  That said, which her risk factors, I do think there is value in evaluating your heart arteries before upcoming surgery for your leg circulation.  Most likely, this will be a diagnostic procedure to assess your cardiac risk of undergoing bypass surgery for legs.  If I find any significant narrowing, I will then discuss with Dr. Randie Heinz regarding further recommendations.  Heart catheterization risks" Risks include but are not limited to bleeding, infection, vascular injury, stroke, myocardial infection, arrhythmia, kidney injury, radiation-related injury in the case of prolonged fluoroscopy use, emergency cardiac surgery, and death. The patient understands the risks of serious complication is 1-2 in 1000 with diagnostic cardiac cath and 1-2% or less with angioplasty/stenting.   Thanks MJP

## 2023-10-12 LAB — CBC WITH DIFFERENTIAL/PLATELET
Basophils Absolute: 0.1 10*3/uL (ref 0.0–0.2)
Basos: 1 %
EOS (ABSOLUTE): 0.2 10*3/uL (ref 0.0–0.4)
Eos: 3 %
Hematocrit: 40.9 % (ref 37.5–51.0)
Hemoglobin: 12.5 g/dL — ABNORMAL LOW (ref 13.0–17.7)
Immature Grans (Abs): 0 10*3/uL (ref 0.0–0.1)
Immature Granulocytes: 0 %
Lymphocytes Absolute: 2.7 10*3/uL (ref 0.7–3.1)
Lymphs: 54 %
MCH: 22.5 pg — ABNORMAL LOW (ref 26.6–33.0)
MCHC: 30.6 g/dL — ABNORMAL LOW (ref 31.5–35.7)
MCV: 74 fL — ABNORMAL LOW (ref 79–97)
Monocytes Absolute: 0.6 10*3/uL (ref 0.1–0.9)
Monocytes: 11 %
Neutrophils Absolute: 1.6 10*3/uL (ref 1.4–7.0)
Neutrophils: 31 %
Platelets: 278 10*3/uL (ref 150–450)
RBC: 5.56 x10E6/uL (ref 4.14–5.80)
RDW: 15.6 % — ABNORMAL HIGH (ref 11.6–15.4)
WBC: 5.1 10*3/uL (ref 3.4–10.8)

## 2023-10-12 LAB — BASIC METABOLIC PANEL
BUN/Creatinine Ratio: 14 (ref 10–24)
BUN: 15 mg/dL (ref 8–27)
CO2: 19 mmol/L — ABNORMAL LOW (ref 20–29)
Calcium: 9.6 mg/dL (ref 8.6–10.2)
Chloride: 100 mmol/L (ref 96–106)
Creatinine, Ser: 1.08 mg/dL (ref 0.76–1.27)
Glucose: 205 mg/dL — ABNORMAL HIGH (ref 70–99)
Potassium: 4.6 mmol/L (ref 3.5–5.2)
Sodium: 138 mmol/L (ref 134–144)
eGFR: 78 mL/min/{1.73_m2} (ref >59)

## 2023-10-15 ENCOUNTER — Telehealth: Payer: Self-pay | Admitting: *Deleted

## 2023-10-15 NOTE — Telephone Encounter (Signed)
See Phone Note 10/15/23

## 2023-10-15 NOTE — Telephone Encounter (Signed)
Cardiac Catheterization scheduled at [facility] for: Wednesday October 16, 2023 11:30 AM Arrival time [facility] Main Entrance A at: 9:30 AM  Nothing to eat after midnight prior to procedure, clear liquids until 5 AM day of procedure.  Medication instructions: -Hold:  Eliquis-none 10/14/23 until post procedure  Insulin (tresiba)-AM of procedure/1/2/usual dose HS prior to procedure  Actos-AM of procedure -Other usual morning medications can be taken with sips of water including aspirin 81 mg and Plavix 75 mg  Plan to go home the same day, you will only stay overnight if medically necessary.  You must have responsible adult to drive you home.  Someone must be with you the first 24 hours after you arrive home.  Reviewed procedure instructions with patient.

## 2023-10-16 ENCOUNTER — Encounter (HOSPITAL_COMMUNITY)
Admission: RE | Disposition: A | Discharge status: Home / Self Care | Payer: Self-pay | Source: Home / Self Care | Attending: Cardiology

## 2023-10-16 ENCOUNTER — Other Ambulatory Visit: Payer: Self-pay

## 2023-10-16 ENCOUNTER — Ambulatory Visit (HOSPITAL_COMMUNITY)
Admission: RE | Admit: 2023-10-16 | Discharge: 2023-10-16 | Disposition: A | Discharge status: Home / Self Care | Payer: Medicare HMO | Attending: Cardiology | Admitting: Cardiology

## 2023-10-16 DIAGNOSIS — R9439 Abnormal result of other cardiovascular function study: Secondary | ICD-10-CM | POA: Diagnosis present

## 2023-10-16 DIAGNOSIS — I2584 Coronary atherosclerosis due to calcified coronary lesion: Secondary | ICD-10-CM | POA: Diagnosis not present

## 2023-10-16 DIAGNOSIS — Z79899 Other long term (current) drug therapy: Secondary | ICD-10-CM | POA: Diagnosis not present

## 2023-10-16 DIAGNOSIS — I2510 Atherosclerotic heart disease of native coronary artery without angina pectoris: Secondary | ICD-10-CM | POA: Insufficient documentation

## 2023-10-16 DIAGNOSIS — Z7901 Long term (current) use of anticoagulants: Secondary | ICD-10-CM | POA: Insufficient documentation

## 2023-10-16 DIAGNOSIS — E785 Hyperlipidemia, unspecified: Secondary | ICD-10-CM | POA: Insufficient documentation

## 2023-10-16 DIAGNOSIS — Z7902 Long term (current) use of antithrombotics/antiplatelets: Secondary | ICD-10-CM | POA: Diagnosis not present

## 2023-10-16 DIAGNOSIS — I10 Essential (primary) hypertension: Secondary | ICD-10-CM | POA: Diagnosis not present

## 2023-10-16 DIAGNOSIS — I739 Peripheral vascular disease, unspecified: Secondary | ICD-10-CM | POA: Diagnosis not present

## 2023-10-16 HISTORY — PX: LEFT HEART CATH AND CORONARY ANGIOGRAPHY: CATH118249

## 2023-10-16 LAB — GLUCOSE, CAPILLARY
Glucose-Capillary: 89 mg/dL (ref 70–99)
Glucose-Capillary: 74 mg/dL (ref 70–99)

## 2023-10-16 SURGERY — LEFT HEART CATH AND CORONARY ANGIOGRAPHY
Anesthesia: LOCAL

## 2023-10-16 MED ORDER — CLOPIDOGREL BISULFATE 75 MG PO TABS
75.0000 mg | ORAL_TABLET | ORAL | Status: AC
  Administered 2023-10-16: 75 mg via ORAL
  Filled 2023-10-16: qty 1

## 2023-10-16 MED ORDER — ASPIRIN 81 MG PO CHEW
81.0000 mg | CHEWABLE_TABLET | ORAL | Status: AC
  Administered 2023-10-16: 81 mg via ORAL
  Filled 2023-10-16: qty 1

## 2023-10-16 MED ORDER — HEPARIN SODIUM (PORCINE) 1000 UNIT/ML IJ SOLN
INTRAMUSCULAR | Status: DC | PRN
  Administered 2023-10-16: 4500 [IU] via INTRAVENOUS

## 2023-10-16 MED ORDER — ACETAMINOPHEN 325 MG PO TABS: 650.0000 mg | ORAL_TABLET | ORAL | Status: DC | PRN

## 2023-10-16 MED ORDER — SODIUM CHLORIDE 0.9% FLUSH: 3.0000 mL | INTRAVENOUS | Status: DC | PRN

## 2023-10-16 MED ORDER — HEPARIN SODIUM (PORCINE) 1000 UNIT/ML IJ SOLN
INTRAMUSCULAR | Status: AC
  Filled 2023-10-16: qty 10

## 2023-10-16 MED ORDER — ONDANSETRON HCL 4 MG/2ML IJ SOLN: 4.0000 mg | Freq: Four times a day (QID) | INTRAMUSCULAR | Status: DC | PRN

## 2023-10-16 MED ORDER — FENTANYL CITRATE (PF) 100 MCG/2ML IJ SOLN
INTRAMUSCULAR | Status: AC
  Filled 2023-10-16: qty 2

## 2023-10-16 MED ORDER — SODIUM CHLORIDE 0.9 % WEIGHT BASED INFUSION
3.0000 mL/kg/h | INTRAVENOUS | Status: AC
Start: 2023-10-16 — End: 2023-10-16
  Administered 2023-10-16: 3 mL/kg/h via INTRAVENOUS

## 2023-10-16 MED ORDER — MIDAZOLAM HCL 2 MG/2ML IJ SOLN
INTRAMUSCULAR | Status: AC
  Filled 2023-10-16: qty 2

## 2023-10-16 MED ORDER — SODIUM CHLORIDE 0.9 % IV SOLN: INTRAVENOUS | Status: DC

## 2023-10-16 MED ORDER — FENTANYL CITRATE (PF) 100 MCG/2ML IJ SOLN
INTRAMUSCULAR | Status: DC | PRN
  Administered 2023-10-16: 50 ug via INTRAVENOUS

## 2023-10-16 MED ORDER — HYDRALAZINE HCL 20 MG/ML IJ SOLN: 10.0000 mg | INTRAMUSCULAR | Status: DC | PRN

## 2023-10-16 MED ORDER — MIDAZOLAM HCL 2 MG/2ML IJ SOLN
INTRAMUSCULAR | Status: DC | PRN
  Administered 2023-10-16: 1 mg via INTRAVENOUS

## 2023-10-16 MED ORDER — LABETALOL HCL 5 MG/ML IV SOLN
10.0000 mg | INTRAVENOUS | Status: DC | PRN
  Administered 2023-10-16: 10 mg via INTRAVENOUS
  Filled 2023-10-16: qty 4

## 2023-10-16 MED ORDER — HEPARIN (PORCINE) IN NACL 1000-0.9 UT/500ML-% IV SOLN
INTRAVENOUS | Status: DC | PRN
  Administered 2023-10-16 (×2): 500 mL

## 2023-10-16 MED ORDER — IOHEXOL 350 MG/ML SOLN
INTRAVENOUS | Status: DC | PRN
  Administered 2023-10-16: 70 mL

## 2023-10-16 MED ORDER — SODIUM CHLORIDE 0.9 % IV SOLN: 250.0000 mL | INTRAVENOUS | Status: DC | PRN

## 2023-10-16 MED ORDER — LIDOCAINE HCL (PF) 1 % IJ SOLN
INTRAMUSCULAR | Status: DC | PRN
  Administered 2023-10-16: 2 mL

## 2023-10-16 MED ORDER — LIDOCAINE HCL (PF) 1 % IJ SOLN
INTRAMUSCULAR | Status: AC
  Filled 2023-10-16: qty 30

## 2023-10-16 MED ORDER — SODIUM CHLORIDE 0.9% FLUSH: 3.0000 mL | Freq: Two times a day (BID) | INTRAVENOUS | Status: DC

## 2023-10-16 MED ORDER — VERAPAMIL HCL 2.5 MG/ML IV SOLN
INTRAVENOUS | Status: AC
  Filled 2023-10-16: qty 2

## 2023-10-16 MED ORDER — SODIUM CHLORIDE 0.9 % WEIGHT BASED INFUSION: 1.0000 mL/kg/h | INTRAVENOUS | Status: DC

## 2023-10-16 MED ORDER — VERAPAMIL HCL 2.5 MG/ML IV SOLN
INTRAVENOUS | Status: DC | PRN
  Administered 2023-10-16: 10 mL via INTRA_ARTERIAL

## 2023-10-16 SURGICAL SUPPLY — 12 items
CATH 5FR JL3.5 JR4 ANG PIG MP (CATHETERS) IMPLANT
CATH EXPO 5F MPA-1 (CATHETERS) IMPLANT
CATH INFINITI 5 FR 3DRC (CATHETERS) IMPLANT
CATH INFINITI 5 FR AR1 MOD (CATHETERS) IMPLANT
CATH INFINITI AMBI 5FR TG (CATHETERS) IMPLANT
DEVICE RAD COMP TR BAND LRG (VASCULAR PRODUCTS) IMPLANT
ELECT DEFIB PAD ADLT CADENCE (PAD) IMPLANT
GLIDESHEATH SLEND A-KIT 6F 22G (SHEATH) IMPLANT
GUIDEWIRE INQWIRE 1.5J.035X260 (WIRE) IMPLANT
INQWIRE 1.5J .035X260CM (WIRE) ×1
PACK CARDIAC CATHETERIZATION (CUSTOM PROCEDURE TRAY) ×1 IMPLANT
SET ATX-X65L (MISCELLANEOUS) IMPLANT

## 2023-10-16 NOTE — H&P (Signed)
OV 09/17/2023 copied for documentation   Cardiology Office Note:  .   Date:  10/16/2023  ID:  Donald Palmer, DOB 08-Feb-1961, MRN [id number] PCP: Patient, No Pcp Per  [facility] Providers Cardiologist:  Donald Mainland, MD PCP: No PCP per patient   History of Present Illness: .    62 year old African-American male with hypertension, hyperlipidemia, PAD  Patient was last seen by Dr. Allyson Palmer in 06/2019.  He previously underwent injection atherectomy and DCB PTCA to left popliteal artery for symptoms of claudication. More recently, he underwent peripheral angiography by Dr. Randie Palmer that showed 50% left common femoral artery disease, followed by occlusion of left SFA within prior popliteal stent, with reconstitution of three-vessel runoff just above the knee level.  Dr. Randie Palmer is going to perform left common femoral endarterectomy with potential femoral to above-knee popliteal artery bypass.  He is here with his wife. His walking is significantly limited due to lifestyle limiting claudication, which limits ability to assess for exertional chest pain.  Patient is on Eliquis. It appears that he had acute superficial vein thrombosis in small saphenous vein in 05/2022, but do not see any prior evidence of DVT. She is also on Plavix for PAD.  There were no vitals filed for this visit.    ROS:  Review of Systems  Cardiovascular:  Positive for claudication. Negative for chest pain, dyspnea on exertion, leg swelling, palpitations and syncope.     Studies Reviewed: Marland Kitchen         Physical Exam:   Physical Exam Vitals and nursing note reviewed.  Constitutional:      General: He is not in acute distress. Neck:     Vascular: No JVD.  Cardiovascular:     Rate and Rhythm: Normal rate and regular rhythm.     Pulses:          Dorsalis pedis pulses are 1+ on the right side and 1+ on the left side.       Posterior tibial pulses are 0 on the right side and 0 on the left side.     Heart sounds:  Normal heart sounds. No murmur heard. Pulmonary:     Effort: Pulmonary effort is normal.     Breath sounds: Normal breath sounds. No wheezing or rales.  Musculoskeletal:     Right lower leg: No edema.     Left lower leg: No edema.      VISIT DIAGNOSES: No diagnosis found.    ASSESSMENT AND PLAN: .    62 year old African-American male with hypertension, hyperlipidemia, PAD  Pre-op evaluation: Limited functional capacity due to claudication. Therefore, unable to assess for symptoms of exertional angina or dyspnea. Recommend echocardiogram, Lexiscan nuclear stress test for cardiac risk stratification prior to vascular surgery. He is currently on Eliquis. I am not clear of the indication. Per my review he had a superficial vein thrombosis in 05/2022. But no DVT. If no other known etiology, Eliquis can be stopped 2 days before upcoming PAD surgery, and may not be resumed. I defer post-op antiplatelet therapy to Dr. Randie Palmer.  PAD: Management as per Dr. Randie Palmer. Continue atorvastatin 80 mg daily. Check lipid panel.  Informed Consent   Shared Decision Making/Informed Consent The risks [chest pain, shortness of breath, cardiac arrhythmias, dizziness, blood pressure fluctuations, myocardial infarction, stroke/transient ischemic attack, nausea, vomiting, allergic reaction, radiation exposure, metallic taste sensation and life-threatening complications (estimated to be 1 in 10,000)], benefits (risk stratification, diagnosing coronary artery disease, treatment guidance) and alternatives of a  nuclear stress test were discussed in detail with Donald Palmer and he agrees to proceed.         F/u in 6 months  Signed, Donald Negus, MD

## 2023-10-16 NOTE — Interval H&P Note (Signed)
History and Physical Interval Note:  10/16/2023 3:31 PM  Donald Palmer  has presented today for surgery, with the diagnosis of abnormal stress test.  The various methods of treatment have been discussed with the patient and family. After consideration of risks, benefits and other options for treatment, the patient has consented to  Procedure(s): LEFT HEART CATH AND CORONARY ANGIOGRAPHY (N/A) as a surgical intervention.  The patient's history has been reviewed, patient examined, no change in status, stable for surgery.  I have reviewed the patient's chart and labs.  Questions were answered to the patient's satisfaction.     Laquida Cotrell J Cleven Jansma

## 2023-10-17 ENCOUNTER — Encounter (HOSPITAL_COMMUNITY): Payer: Self-pay | Admitting: Cardiology

## 2023-10-18 ENCOUNTER — Ambulatory Visit: Payer: Self-pay | Admitting: Emergency Medicine

## 2023-10-18 NOTE — Heart Team MDD (Signed)
   Heart Team Multi-Disciplinary Discussion  Patient: Donald Palmer  DOB: [birth year]  MRN: [id number]   Date: 10/18/2023  8:29 AM    Attendees: Interventional Cardiology: Lorine Bears, MD Shawnie Pons, MD Bryan Lemma, MD Yates Decamp, MD Truett Mainland, MD Tonny Bollman, MD  Cardiothoracic Surgery: Brynda Greathouse, MD     Patient History: 62 year old African-American male with significant limitations with walking and lifestyle limiting claudication who previously underwent injection atherectomy and DCB PTCA to left popliteal artery for symptoms of claudication. More recently, he underwent peripheral angiography by Dr. Randie Heinz that showed 50% left common femoral artery disease, followed by occlusion of left SFA within prior popliteal stent, with reconstitution of three-vessel runoff just above the knee level.  Dr. Randie Heinz is going to perform left common femoral endarterectomy with potential femoral to above-knee popliteal artery bypass.   Risk Factors: Diabetes Mellitus Hypertension Hyperlipidemia Additional Risk Factors: PAD     Review of Prior Angiography and PCI Procedures: The left heart cath and coronary angiography performed on 10/16/2023 was reviewed and discussed in detail. The final results were severe multivessel CAD including: LM: Distal 40% stenosis of mild calcification, LAD: Proximal-mid 80% calcific stenosis at trifurcation of LAD, diag, and septal perforator, Lcx: Ostial 80% stenosis before a high bifurcating OM1, OM2 proximal 80% disease, RCA: Proximal focal 75% stenosis, Distal RCA occlusion, left to right collaterals to distal RCA/PDA. LVEF 45-50% with severe inferior hypokinesis.      Discussion: After presentation consideration of treatment options occurred contrasting PCI versus referral to CT surgery for possible CABG. Considering the severe multivessel CAD, diabetes, and calcification, the heart team recommendation was for referral to CT surgery for  possible CABG prior to vascular surgery.    Recommendations: CABG      Alison Murray, RN  10/18/2023 8:29 AM

## 2023-10-21 ENCOUNTER — Telehealth: Payer: Self-pay | Admitting: Cardiology

## 2023-10-21 DIAGNOSIS — I2510 Atherosclerotic heart disease of native coronary artery without angina pectoris: Secondary | ICD-10-CM

## 2023-10-21 NOTE — Telephone Encounter (Signed)
Discussed in multidisciplinary meeting.  Perioperative cardiac risk for lower extremity bypass surgery is high due to severe multivessel CAD.  In absence of critical limb ischemia, recommend coronary revascularization before peripheral revascularization.  Coronary anatomy suggests CABG would be better than PCI or revascularization.  Will seek CVTS consult for CABG.  Elder Negus, MD

## 2023-10-21 NOTE — Telephone Encounter (Signed)
Left message for patient to call back  

## 2023-10-22 ENCOUNTER — Telehealth: Payer: Self-pay | Admitting: Cardiology

## 2023-10-22 ENCOUNTER — Ambulatory Visit (HOSPITAL_COMMUNITY): Payer: Medicare HMO | Attending: Cardiology

## 2023-10-22 DIAGNOSIS — E119 Type 2 diabetes mellitus without complications: Secondary | ICD-10-CM | POA: Insufficient documentation

## 2023-10-22 DIAGNOSIS — I10 Essential (primary) hypertension: Secondary | ICD-10-CM | POA: Insufficient documentation

## 2023-10-22 DIAGNOSIS — I739 Peripheral vascular disease, unspecified: Secondary | ICD-10-CM | POA: Insufficient documentation

## 2023-10-22 LAB — ECHOCARDIOGRAM COMPLETE
Area-P 1/2: 4.07 cm2
S' Lateral: 3.2 cm

## 2023-10-22 NOTE — Telephone Encounter (Signed)
I had conveyed my preliminary findings after the cath. Ill be happy to call him if he has any questions after your initial call with the patient.  Thanks MJP

## 2023-10-22 NOTE — Telephone Encounter (Signed)
Pt and spouse came by for echo this afternoon. Is requesting a call concerning the upcoming procedure. Stated the heart is not strong enough for the procedure. Very confused and needs talking to. Please call wife number indicated in chart.

## 2023-10-22 NOTE — Progress Notes (Signed)
Normal pumping function of the heart. No severe heart valve abnormalities noted.  ° ° °Thanks °MJP ° °

## 2023-10-23 NOTE — Telephone Encounter (Addendum)
error 

## 2023-10-23 NOTE — Telephone Encounter (Signed)
Spoke with the patient and reviewed information from Dr. Rosemary Holms. Patient verbalized understanding. He request that I reach out to his wife Donald Palmer to let her know. Attempted to call her but had to leave a message for her to call us back

## 2023-10-24 NOTE — Progress Notes (Signed)
Surgical Instructions    Your procedure is scheduled on Tuesday, November 12.    Report to [facility] Main Entrance "A" at 0530 A.M., then check in with the Admitting office.  Call this number if you have problems the morning of surgery:  [phone]  If you have any questions prior to your surgery date call [phone]: Open Monday-Friday 8am-4pm If you experience any cold or flu symptoms such as cough, fever, chills, shortness of breath, etc. between now and your scheduled surgery, please notify us at the above number.     Remember:  Do not eat after midnight the night before your surgery    Take these medicines the morning of surgery with A SIP OF WATER:            famotidine (PEPCID)            terbinafine (LAMISIL)              As needed:            colchicine             cyclobenzaprine (FLEXERIL)             fluticasone (FLONASE) asal spray             ketorolac (TORADOL)             omeprazole (PRILOSEC)   As of today, STOP taking any Aspirin (unless otherwise instructed by your surgeon) Aleve, Naproxen, Ibuprofen, Motrin, Advil, Goody's, BC's, all herbal medications, fish oil, and all vitamins.  Please follow your surgeon's instructions on when to stop Apixaban (Eliquis) and clopidogrel (PLAVIX).  WHAT DO I DO ABOUT MY DIABETES MEDICATION?   Do not take oral diabetes medicines (pills) the morning of surgery. This includes pioglitazone (ACTOS).    THE NIGHT BEFORE SURGERY, take 25 units of insulin degludec (TRESIBA)        THE MORNING OF SURGERY, take 25 units of insulin degludec (TRESIBA)   The day of surgery, do not take other diabetes injectables, including Byetta (exenatide), Bydureon (exenatide ER), Victoza (liraglutide), or Trulicity (dulaglutide).  If your CBG is greater than 220 mg/dL, you may take  of your sliding scale (correction) dose of insulin.   HOW TO MANAGE YOUR DIABETES BEFORE AND AFTER SURGERY  Why is it important to control my blood  sugar before and after surgery? Improving blood sugar levels before and after surgery helps healing and can limit problems. A way of improving blood sugar control is eating a healthy diet by:  Eating less sugar and carbohydrates  Increasing activity/exercise  Talking with your doctor about reaching your blood sugar goals High blood sugars (greater than 180 mg/dL) can raise your risk of infections and slow your recovery, so you will need to focus on controlling your diabetes during the weeks before surgery. Make sure that the doctor who takes care of your diabetes knows about your planned surgery including the date and location.  How do I manage my blood sugar before surgery? Check your blood sugar at least 4 times a day, starting 2 days before surgery, to make sure that the level is not too high or low.  Check your blood sugar the morning of your surgery when you wake up and every 2 hours until you get to the Short Stay unit.  If your blood sugar is less than 70 mg/dL, you will need to treat for low blood sugar: Do not take insulin. Treat a low blood sugar (less  than 70 mg/dL) with  cup of clear juice (cranberry or apple), 4 glucose tablets, OR glucose gel. Recheck blood sugar in 15 minutes after treatment (to make sure it is greater than 70 mg/dL). If your blood sugar is not greater than 70 mg/dL on recheck, call [phone] for further instructions. Report your blood sugar to the short stay nurse when you get to Short Stay.  If you are admitted to the hospital after surgery: Your blood sugar will be checked by the staff and you will probably be given insulin after surgery (instead of oral diabetes medicines) to make sure you have good blood sugar levels. The goal for blood sugar control after surgery is 80-180 mg/dL.                       Do NOT Smoke (Tobacco/Vaping) for 24 hours prior to your procedure.  If you use a CPAP at night, you may bring your mask/headgear for your overnight  stay.   Contacts, glasses, piercing's, hearing aid's, dentures or partials may not be worn into surgery, please bring cases for these belongings.    For patients admitted to the hospital, discharge time will be determined by your treatment team.   Patients discharged the day of surgery will not be allowed to drive home, and someone needs to stay with them for 24 hours.  SURGICAL WAITING ROOM VISITATION Patients having surgery or a procedure may have no more than 2 support people in the waiting area - these visitors may rotate.   Children under the age of 55 must have an adult with them who is not the patient. If the patient needs to stay at the hospital during part of their recovery, the visitor guidelines for inpatient rooms apply. Pre-op nurse will coordinate an appropriate time for 1 support person to accompany patient in pre-op.  This support person may not rotate.   Please refer to the [facility] website for the visitor guidelines for Inpatients (after your surgery is over and you are in a regular room).    Special instructions:   [facility]- Preparing For Surgery  Before surgery, you can play an important role. Because skin is not sterile, your skin needs to be as free of germs as possible. You can reduce the number of germs on your skin by washing with CHG (chlorahexidine gluconate) Soap before surgery.  CHG is an antiseptic cleaner which kills germs and bonds with the skin to continue killing germs even after washing.    Oral Hygiene is also important to reduce your risk of infection.  Remember - BRUSH YOUR TEETH THE MORNING OF SURGERY WITH YOUR REGULAR TOOTHPASTE  Please do not use if you have an allergy to CHG or antibacterial soaps. If your skin becomes reddened/irritated stop using the CHG.  Do not shave (including legs and underarms) for at least 48 hours prior to first CHG shower. It is OK to shave your face.  Please follow these instructions carefully.   Shower the NIGHT  BEFORE SURGERY and the MORNING OF SURGERY  If you chose to wash your hair, wash your hair first as usual with your normal shampoo.  After you shampoo, rinse your hair and body thoroughly to remove the shampoo.  Use CHG Soap as you would any other liquid soap. You can apply CHG directly to the skin and wash gently with a scrungie or a clean washcloth.   Apply the CHG Soap to your body ONLY FROM THE NECK  DOWN.  Do not use on open wounds or open sores. Avoid contact with your eyes, ears, mouth and genitals (private parts). Wash Face and genitals (private parts)  with your normal soap.   Wash thoroughly, paying special attention to the area where your surgery will be performed.  Thoroughly rinse your body with warm water from the neck down.  DO NOT shower/wash with your normal soap after using and rinsing off the CHG Soap.  Pat yourself dry with a CLEAN TOWEL.  Wear CLEAN PAJAMAS to bed the night before surgery  Place CLEAN SHEETS on your bed the night before your surgery  DO NOT SLEEP WITH PETS.   Day of Surgery: Take a shower with CHG soap. Do not wear jewelry or makeup Do not wear lotions, powders, perfumes/colognes, or deodorant. Do not shave 48 hours prior to surgery.  Men may shave face and neck. Do not bring valuables to the hospital.  [facility] is not responsible for any belongings or valuables. Do not wear nail polish, gel polish, artificial nails, or any other type of covering on natural nails (fingers and toes) If you have artificial nails or gel coating that need to be removed by a nail salon, please have this removed prior to surgery. Artificial nails or gel coating may interfere with anesthesia's ability to adequately monitor your vital signs. Wear Clean/Comfortable clothing the morning of surgery Remember to brush your teeth WITH YOUR REGULAR TOOTHPASTE.   Please read over the following fact sheets that you were given.    If you received a COVID test during your  pre-op visit  it is requested that you wear a mask when out in public, stay away from anyone that may not be feeling well and notify your surgeon if you develop symptoms. If you have been in contact with anyone that has tested positive in the last 10 days please notify you surgeon.

## 2023-10-25 ENCOUNTER — Inpatient Hospital Stay (HOSPITAL_COMMUNITY)
Admission: RE | Admit: 2023-10-25 | Discharge: 2023-10-25 | Disposition: A | Discharge status: Home / Self Care | Payer: Medicare HMO | Source: Ambulatory Visit

## 2023-10-25 ENCOUNTER — Encounter: Payer: Medicare HMO | Admitting: Thoracic Surgery (Cardiothoracic Vascular Surgery)

## 2023-10-25 NOTE — Telephone Encounter (Signed)
Discussed in multidisciplinary meeting.  Perioperative cardiac risk for lower extremity bypass surgery is high due to severe multivessel CAD.  In absence of critical limb ischemia, recommend coronary revascularization before peripheral revascularization.  Coronary anatomy suggests CABG would be better than PCI or revascularization.  Will seek CVTS consult for CABG.

## 2023-10-25 NOTE — Progress Notes (Signed)
Left message for the pt to call back to reschedule his PAT appt since he didn't come today. Unable to reach the pt.

## 2023-10-28 ENCOUNTER — Inpatient Hospital Stay (HOSPITAL_COMMUNITY): Admission: RE | Admit: 2023-10-28 | Payer: Medicare HMO | Source: Ambulatory Visit

## 2023-10-29 ENCOUNTER — Inpatient Hospital Stay (HOSPITAL_COMMUNITY): Admission: RE | Admit: 2023-10-29 | Payer: Medicare HMO | Source: Home / Self Care | Admitting: Vascular Surgery

## 2023-10-29 ENCOUNTER — Encounter (HOSPITAL_COMMUNITY): Admission: RE | Payer: Self-pay | Source: Home / Self Care

## 2023-10-29 ENCOUNTER — Other Ambulatory Visit: Payer: Self-pay

## 2023-10-29 SURGERY — BYPASS GRAFT FEMORAL-POPLITEAL ARTERY
Anesthesia: General | Laterality: Left

## 2023-11-01 ENCOUNTER — Encounter: Payer: Self-pay | Admitting: *Deleted

## 2023-11-01 ENCOUNTER — Other Ambulatory Visit: Payer: Self-pay | Admitting: *Deleted

## 2023-11-01 ENCOUNTER — Institutional Professional Consult (permissible substitution) (INDEPENDENT_AMBULATORY_CARE_PROVIDER_SITE_OTHER): Payer: Medicare HMO | Admitting: Thoracic Surgery (Cardiothoracic Vascular Surgery)

## 2023-11-01 VITALS — BP 139/89 | HR 100 | Resp 18 | Ht 69.0 in | Wt 187.0 lb

## 2023-11-01 DIAGNOSIS — I2510 Atherosclerotic heart disease of native coronary artery without angina pectoris: Secondary | ICD-10-CM

## 2023-11-01 NOTE — H&P (View-Only) (Signed)
[address]       [place] [address]             [phone]        NOEH DUFFEK [facility] Medical Record #[id number] Date of Birth: December 07, 1961  Referring: Elder Negus, MD Primary Care: Patient, No Pcp Per Primary Cardiologist:None  Chief Complaint:    Chief Complaint  Patient presents with   Coronary Artery Disease    Cath 10/23, ECHO 11/5    History of Present Illness:     Donald Palmer 62 y.o. male presents for surgical evaluation of three-vessel coronary artery disease.  This was found incidentally while he was being evaluated for left lower extremity bypass surgery with Dr. Randie Heinz.  He denies any chest pain or exertional dyspnea.  He is very limited in his mobility due to his left lower extremity medication.   Past Medical and Surgical History: Previous Chest Surgery: No Previous Chest Radiation: No Diabetes Mellitus: Yes.  HbA1C 8 Creatinine:  Lab Results  Component Value Date   CREATININE 1.08 10/11/2023   CREATININE 1.10 09/09/2023   CREATININE 0.94 05/31/2022     Past Medical History:  Diagnosis Date   Back pain, chronic    Diabetes mellitus 2007   Hepatitis C    [id number]   History of chronic hepatitis C 05/13/2012   First noted 2013. [employer] inc tattoos obtained while incarcerated. [place] 2016 SVR 2017    Hypertension goal BP (blood pressure) < 140/80    Microcytic anemia    Neuromuscular disorder (HCC)    PAD (peripheral artery disease) (HCC)    Pancreatitis 01/09/2012    Past Surgical History:  Procedure Laterality Date   ABDOMINAL AORTOGRAM N/A 03/03/2018   Procedure: ABDOMINAL AORTOGRAM;  Surgeon: Runell Gess, MD;  Location: [facility] INVASIVE CV LAB;  Service: Cardiovascular;  Laterality: N/A;   ABDOMINAL AORTOGRAM W/LOWER EXTREMITY N/A 06/15/2019   Procedure: ABDOMINAL AORTOGRAM W/LOWER EXTREMITY;  Surgeon: Runell Gess, MD;  Location: [facility] INVASIVE CV LAB;  Service: Cardiovascular;  Laterality: N/A;   ABDOMINAL  AORTOGRAM W/LOWER EXTREMITY N/A 09/09/2023   Procedure: ABDOMINAL AORTOGRAM W/LOWER EXTREMITY;  Surgeon: Maeola Harman, MD;  Location: [facility] INVASIVE CV LAB;  Service: Cardiovascular;  Laterality: N/A;   I & D EXTREMITY Right 05/15/2016   Procedure: RIGHT INDEX FINGER FIRST THROUGH  MIDDLE PHALYNX  AMPUTATION;  Surgeon: Mack Hook, MD;  Location[facility];  Service: Orthopedics;  Laterality: Right;   LEFT HEART CATH AND CORONARY ANGIOGRAPHY N/A 10/16/2023   Procedure: LEFT HEART CATH AND CORONARY ANGIOGRAPHY;  Surgeon: Elder Negus, MD;  Location: [facility] INVASIVE CV LAB;  Service: Cardiovascular;  Laterality: N/A;   LOWER EXTREMITY INTERVENTION Bilateral 03/03/2018   Procedure: LOWER EXTREMITY INTERVENTION;  Surgeon: Runell Gess, MD;  Location: [facility] INVASIVE CV LAB;  Service: Cardiovascular;  Laterality: Bilateral;   OPEN REDUCTION INTERNAL FIXATION (ORIF) DISTAL PHALANX Right 04/02/2016   Procedure: RIGHT INDEX FINGER REPAIR;  Surgeon: Mack Hook, MD;  Location: [facility];  Service: Orthopedics;  Laterality: Right;   PERIPHERAL VASCULAR ATHERECTOMY Left 03/03/2018   Procedure: PERIPHERAL VASCULAR ATHERECTOMY;  Surgeon: Runell Gess, MD;  Location[facility] INVASIVE CV LAB;  Service: Cardiovascular;  Laterality: Left;  SFA WITH PTA DRUG COATED BALLOON   PERIPHERAL VASCULAR INTERVENTION Left 06/15/2019   Procedure: PERIPHERAL VASCULAR INTERVENTION;  Surgeon: Runell Gess, MD;  Location: [facility] INVASIVE CV LAB;  Service: Cardiovascular;  Laterality: Left;  Social History:  Social History   Tobacco Use  Smoking Status Some Days   Current packs/day: 0.10   Average packs/day: 0.1 packs/day for 30.0 years (3.0 ttl pk-yrs)   Types: Cigarettes, Cigars  Smokeless Tobacco Never  Tobacco Comments   5 cigs per week    Social History   Substance and Sexual Activity  Alcohol Use Yes   Alcohol/week: 5.0 standard drinks of alcohol   Types: 5 Cans of beer per week    Comment: Beer.     Allergies  Allergen Reactions   Metformin And Related Other (See Comments)    GI side effects. Stopped 2016    Medications: Asprin: No Statin: Yes Beta Blocker: No Ace Inhibitor: No Anti-Coagulation: Eliquis and Plavix  Current Outpatient Medications  Medication Sig Dispense Refill   Accu-Chek Softclix Lancets lancets check blood sugar 2 times a day 100 each 5   apixaban (ELIQUIS) 5 MG TABS tablet Take 1 tablet (5 mg total) by mouth 2 (two) times daily. 60 tablet 0   atorvastatin (LIPITOR) 80 MG tablet Take 1 tablet (80 mg total) by mouth daily at 6 PM. 90 tablet 3   blood glucose meter kit and supplies KIT Use to check glucose twice a day 1 each 0   Blood Glucose Monitoring Suppl (ACCU-CHEK GUIDE) w/Device KIT Check blood sugar 2 times a day 1 kit 1   clopidogrel (PLAVIX) 75 MG tablet Take 1 tablet (75 mg total) by mouth daily. 90 tablet 3   colchicine 0.6 MG tablet Take 0.6 mg by mouth daily as needed (gout flare).     cyclobenzaprine (FLEXERIL) 10 MG tablet Take 10 mg by mouth 3 (three) times daily as needed for muscle spasms.     cyclobenzaprine (FLEXERIL) 5 MG tablet Take 1 tablet (5 mg total) by mouth daily as needed for muscle spasms. 30 tablet 2   famotidine (PEPCID) 20 MG tablet Take 20 mg by mouth 2 (two) times daily.     fluticasone (FLONASE) 50 MCG/ACT nasal spray USE 2 SPRAYS IN EACH NOSTRIL DAILY (Patient taking differently: Place 2 sprays into both nostrils daily as needed for allergies.) 16 g 11   glucose blood (ACCU-CHEK GUIDE) test strip USE TO CHECK GLUCOSE TWICE DAILY 100 strip 5   insulin degludec (TRESIBA) 100 UNIT/ML FlexTouch Pen Inject 20 Units into the skin daily. (Patient taking differently: Inject 50 Units into the skin daily.) 6 mL 5   Insulin Pen Needle (PEN NEEDLES) 32G X 4 MM MISC Use to inject liraglutide once a day 100 each 3   ketorolac (TORADOL) 10 MG tablet Take 10 mg by mouth 4 (four) times daily as needed for severe pain  (pain score 7-10).     Lancet Devices MISC 1 each by Does not apply route 2 (two) times daily. Please use to check blood sugar 2 times daily. diag code E11.40. noninsulin dependent 100 each 11   losartan (COZAAR) 50 MG tablet Take 1 tablet (50 mg total) by mouth daily. 90 tablet 3   omeprazole (PRILOSEC) 40 MG capsule Take 1 capsule (40 mg total) by mouth daily. (Patient taking differently: Take 40 mg by mouth daily as needed (acid reflux).) 90 capsule 3   OVER THE COUNTER MEDICATION Take 1 tablet by mouth daily. [employer]     pioglitazone (ACTOS) 30 MG tablet Take 30 mg by mouth daily.     terbinafine (LAMISIL) 250 MG tablet Take 250 mg by mouth every other day.  No current facility-administered medications for this visit.    (Not in a hospital admission)   Family History  Problem Relation Age of Onset   Diabetes Mother    Hypertension Mother    Hyperlipidemia Mother    Asthma Sister    Obesity Sister    Diabetes Brother    Diabetes Brother    Diabetes Daughter    Heart attack Neg Hx    Sudden death Neg Hx      Review of Systems:   Review of Systems  Constitutional:  Positive for malaise/fatigue.  Respiratory:  Negative for shortness of breath.   Cardiovascular:  Positive for leg swelling. Negative for chest pain.  Musculoskeletal:  Positive for myalgias.  Neurological:  Positive for tingling.      Physical Exam: BP 139/89 (BP Location: Right Arm, Patient Position: Sitting)   Pulse 100   Resp 18   Ht 5\' 9"  (1.753 m)   Wt 187 lb (84.8 kg)   SpO2 97% Comment: RA  BMI 27.62 kg/m  Physical Exam Constitutional:      General: He is not in acute distress.    Appearance: He is normal weight.  HENT:     Head: Normocephalic.  Eyes:     Extraocular Movements: Extraocular movements intact.  Cardiovascular:     Rate and Rhythm: Tachycardia present.  Pulmonary:     Effort: Pulmonary effort is normal.  Abdominal:     General: Abdomen is flat.  Musculoskeletal:         General: Normal range of motion.     Cervical back: Normal range of motion.  Skin:    General: Skin is warm and dry.  Neurological:     General: No focal deficit present.     Mental Status: He is alert and oriented to person, place, and time.       Diagnostic Studies & Laboratory data:    Left Heart Catherization:  Intervention  Echo: IMPRESSIONS     1. Left ventricular ejection fraction, by estimation, is 55 to 60%. The  left ventricle has normal function. The left ventricle has no regional  wall motion abnormalities. Left ventricular diastolic parameters are  consistent with Grade I diastolic  dysfunction (impaired relaxation). The average left ventricular global  longitudinal strain is -22.0 %. The global longitudinal strain is normal.   2. Right ventricular systolic function is normal. The right ventricular  size is normal.   3. The mitral valve is normal in structure. Trivial mitral valve  regurgitation. No evidence of mitral stenosis.   4. The aortic valve is tricuspid. Aortic valve regurgitation is not  visualized. Aortic valve sclerosis/calcification is present, without any  evidence of aortic stenosis.   5. The inferior vena cava is normal in size with greater than 50%  respiratory variability, suggesting right atrial pressure of 3 mmHg.     EKG: Sinus I have independently reviewed the above radiologic studies and discussed with the patient   Recent Lab Findings: Lab Results  Component Value Date   WBC 5.1 10/11/2023   HGB 12.5 (L) 10/11/2023   HCT 40.9 10/11/2023   PLT 278 10/11/2023   GLUCOSE 205 (H) 10/11/2023   CHOL 184 02/26/2022   TRIG 203 (H) 02/26/2022   HDL 48 02/26/2022   LDLCALC 101 (H) 02/26/2022   ALT 16 04/10/2018   AST 22 04/10/2018   NA 138 10/11/2023   K 4.6 10/11/2023   CL 100 10/11/2023   CREATININE 1.08 10/11/2023  BUN 15 10/11/2023   CO2 19 (L) 10/11/2023   TSH 0.927 06/03/2019   INR 1.0 02/19/2018   HGBA1C 8.0 (A)  06/11/2022      Assessment / Plan:   62 year old male with three-vessel coronary disease.  He also has claudication and his left lower extremity, and is scheduled for left femoral endarterectomy and femoral to popliteal bypass.,  Review of his left heart catheterization and he has good targets in his LAD, and PDA.  His left heart catheterization reveals normal function without any significant valvular disease.  We discussed risks and benefits of CABG, and I also discussed the possibility of decreased blood flow to his leg in the perioperative period given his severe peripheral vascular disease.  He is agreeable to proceed.     I  spent 40 minutes counseling the patient face to face.   Corliss Skains 11/01/2023 4:02 PM

## 2023-11-01 NOTE — Progress Notes (Signed)
[address]       [place] [address]             [phone]        [name] Medical Record #[id number] Date of Birth: 22-Aug-1961  Referring: Elder Negus, MD Primary Care: Patient, No Pcp Per Primary Cardiologist:None  Chief Complaint:    Chief Complaint  Patient presents with   Coronary Artery Disease    Cath 10/23, ECHO 11/5    History of Present Illness:     Donald Palmer 62 y.o. male presents for surgical evaluation of three-vessel coronary artery disease.  This was found incidentally while he was being evaluated for left lower extremity bypass surgery with Dr. Randie Heinz.  He denies any chest pain or exertional dyspnea.  He is very limited in his mobility due to his left lower extremity medication.   Past Medical and Surgical History: Previous Chest Surgery: No Previous Chest Radiation: No Diabetes Mellitus: Yes.  HbA1C 8 Creatinine:  Lab Results  Component Value Date   CREATININE 1.08 10/11/2023   CREATININE 1.10 09/09/2023   CREATININE 0.94 05/31/2022     Past Medical History:  Diagnosis Date   Back pain, chronic    Diabetes mellitus 2007   Hepatitis C    [id number]   History of chronic hepatitis C 05/13/2012   First noted 2013. RF inc tattoos obtained while incarcerated. Harv[place] 2016 SVR 2017    Hypertension goal BP (blood pressure) < 140/80    Microcytic anemia    Neuromuscular disorder (HCC)    PAD (peripheral artery disease) (HCC)    Pancreatitis 01/09/2012    Past Surgical History:  Procedure Laterality Date   ABDOMINAL AORTOGRAM N/A 03/03/2018   Procedure: ABDOMINAL AORTOGRAM;  Surgeon: Runell Gess, MD;  Location: [facility] IN[facility] CV [facility];  Service: Cardiovascular;  Laterality: N/A;   ABDOMINAL AORTOGRAM W/LOWER EXTREMITY N/A 06/15/2019   Procedure: ABDOMINAL AORTOGRAM W/LOWER EXTREMITY;  Surgeon: Runell Gess, MD;  Location: [facility] CV [facility];  Service: Cardiovascular;  Laterality: N/A;   ABDOMINAL  AORTOGRAM W/LOWER EXTREMITY N/A 09/09/2023   Procedure: ABDOMINAL AORTOGRAM W/LOWER EXTREMITY;  Surgeon: Maeola Harman, MD;  Location: [facility] INVASIVE CV LAB;  Service: Cardiovascular;  Laterality: N/A;   I & D EXTREMITY Right 05/15/2016   Procedure: RIGHT INDEX FINGER FIRST THROUGH  MIDDLE PHALYNX  AMPUTATION;  Surgeon: Mack Hook, MD;  Location: [facility];  Service: Orthopedics;  Laterality: Right;   LEFT HEART CATH AND CORONARY ANGIOGRAPHY N/A 10/16/2023   Procedure: LEFT HEART CATH AND CORONARY ANGIOGRAPHY;  Surgeon: Elder Negus, MD;  Location: [facility];  Service: Cardiovascular;  Laterality: N/A;   LOWER EXTREMITY INTERVENTION Bilateral 03/03/2018   Procedure: LOWER EXTREMITY INTERVENTION;  Surgeon: Runell Gess, MD;  Location: [facility];  Service: Cardiovascular;  Laterality: Bilateral;   OPEN REDUCTION INTERNAL FIXATION (ORIF) DISTAL PHALANX Right 04/02/2016   Procedure: RIGHT INDEX FINGER REPAIR;  Surgeon: Mack Hook, MD;  Location: [facility];  Service: Orthopedics;  Laterality: Right;   PERIPHERAL VASCULAR ATHERECTOMY Left 03/03/2018   Procedure: PERIPHERAL VASCULAR ATHERECTOMY;  Surgeon: Runell Gess, MD;  Location: [facility] INVAS[facility] CV LAB;  Service: Cardiovascular;  Laterality: Left;  SFA WITH PTA DRUG COATED BALLOON   PERIPHERAL VASCULAR INTERVENTION Left 06/15/2019   Procedure: PERIPHERAL VASCULAR INTERVENTION;  Surgeon: Runell Gess, MD;  Location: [facility];  Service: Cardiovascular;  Laterality: Left;  Social History:  Social History   Tobacco Use  Smoking Status Some Days   Current packs/day: 0.10   Average packs/day: 0.1 packs/day for 30.0 years (3.0 ttl pk-yrs)   Types: Cigarettes, Cigars  Smokeless Tobacco Never  Tobacco Comments   5 cigs per week    Social History   Substance and Sexual Activity  Alcohol Use Yes   Alcohol/week: 5.0 standard drinks of alcohol   Types: 5 Cans of beer per week    Comment: Beer.     Allergies  Allergen Reactions   Metformin And Related Other (See Comments)    GI side effects. Stopped 2016    Medications: Asprin: No Statin: Yes Beta Blocker: No Ace Inhibitor: No Anti-Coagulation: Eliquis and Plavix  Current Outpatient Medications  Medication Sig Dispense Refill   Accu-Chek Softclix Lancets lancets check blood sugar 2 times a day 100 each 5   apixaban (ELIQUIS) 5 MG TABS tablet Take 1 tablet (5 mg total) by mouth 2 (two) times daily. 60 tablet 0   atorvastatin (LIPITOR) 80 MG tablet Take 1 tablet (80 mg total) by mouth daily at 6 PM. 90 tablet 3   blood glucose meter kit and supplies KIT Use to check glucose twice a day 1 each 0   Blood Glucose Monitoring Suppl (ACCU-CHEK GUIDE) w/Device KIT Check blood sugar 2 times a day 1 kit 1   clopidogrel (PLAVIX) 75 MG tablet Take 1 tablet (75 mg total) by mouth daily. 90 tablet 3   colchicine 0.6 MG tablet Take 0.6 mg by mouth daily as needed (gout flare).     cyclobenzaprine (FLEXERIL) 10 MG tablet Take 10 mg by mouth 3 (three) times daily as needed for muscle spasms.     cyclobenzaprine (FLEXERIL) 5 MG tablet Take 1 tablet (5 mg total) by mouth daily as needed for muscle spasms. 30 tablet 2   famotidine (PEPCID) 20 MG tablet Take 20 mg by mouth 2 (two) times daily.     fluticasone (FLONASE) 50 MCG/ACT nasal spray USE 2 SPRAYS IN EACH NOSTRIL DAILY (Patient taking differently: Place 2 sprays into both nostrils daily as needed for allergies.) 16 g 11   glucose blood (ACCU-CHEK GUIDE) test strip USE TO CHECK GLUCOSE TWICE DAILY 100 strip 5   insulin degludec (TRESIBA) 100 UNIT/ML FlexTouch Pen Inject 20 Units into the skin daily. (Patient taking differently: Inject 50 Units into the skin daily.) 6 mL 5   Insulin Pen Needle (PEN NEEDLES) 32G X 4 MM MISC Use to inject liraglutide once a day 100 each 3   ketorolac (TORADOL) 10 MG tablet Take 10 mg by mouth 4 (four) times daily as needed for severe pain  (pain score 7-10).     Lancet Devices MISC 1 each by Does not apply route 2 (two) times daily. Please use to check blood sugar 2 times daily. diag code E11.40. noninsulin dependent 100 each 11   losartan (COZAAR) 50 MG tablet Take 1 tablet (50 mg total) by mouth daily. 90 tablet 3   omeprazole (PRILOSEC) 40 MG capsule Take 1 capsule (40 mg total) by mouth daily. (Patient taking differently: Take 40 mg by mouth daily as needed (acid reflux).) 90 capsule 3   OVER THE COUNTER MEDICATION Take 1 tablet by mouth daily. N[employer]     pioglitazone (ACTOS) 30 MG tablet Take 30 mg by mouth daily.     terbinafine (LAMISIL) 250 MG tablet Take 250 mg by mouth every other day.  No current facility-administered medications for this visit.    (Not in a hospital admission)   Family History  Problem Relation Age of Onset   Diabetes Mother    Hypertension Mother    Hyperlipidemia Mother    Asthma Sister    Obesity Sister    Diabetes Brother    Diabetes Brother    Diabetes Daughter    Heart attack Neg Hx    Sudden death Neg Hx      Review of Systems:   Review of Systems  Constitutional:  Positive for malaise/fatigue.  Respiratory:  Negative for shortness of breath.   Cardiovascular:  Positive for leg swelling. Negative for chest pain.  Musculoskeletal:  Positive for myalgias.  Neurological:  Positive for tingling.      Physical Exam: BP 139/89 (BP Location: Right Arm, Patient Position: Sitting)   Pulse 100   Resp 18   Ht 5\' 9"  (1.753 m)   Wt 187 lb (84.8 kg)   SpO2 97% Comment: RA  BMI 27.62 kg/m  Physical Exam Constitutional:      General: He is not in acute distress.    Appearance: He is normal weight.  HENT:     Head: Normocephalic.  Eyes:     Extraocular Movements: Extraocular movements intact.  Cardiovascular:     Rate and Rhythm: Tachycardia present.  Pulmonary:     Effort: Pulmonary effort is normal.  Abdominal:     General: Abdomen is flat.  Musculoskeletal:         General: Normal range of motion.     Cervical back: Normal range of motion.  Skin:    General: Skin is warm and dry.  Neurological:     General: No focal deficit present.     Mental Status: He is alert and oriented to person, place, and time.       Diagnostic Studies & Laboratory data:    Left Heart Catherization:  Intervention  Echo: IMPRESSIONS     1. Left ventricular ejection fraction, by estimation, is 55 to 60%. The  left ventricle has normal function. The left ventricle has no regional  wall motion abnormalities. Left ventricular diastolic parameters are  consistent with Grade I diastolic  dysfunction (impaired relaxation). The average left ventricular global  longitudinal strain is -22.0 %. The global longitudinal strain is normal.   2. Right ventricular systolic function is normal. The right ventricular  size is normal.   3. The mitral valve is normal in structure. Trivial mitral valve  regurgitation. No evidence of mitral stenosis.   4. The aortic valve is tricuspid. Aortic valve regurgitation is not  visualized. Aortic valve sclerosis/calcification is present, without any  evidence of aortic stenosis.   5. The inferior vena cava is normal in size with greater than 50%  respiratory variability, suggesting right atrial pressure of 3 mmHg.     EKG: Sinus I have independently reviewed the above radiologic studies and discussed with the patient   Recent Lab Findings: Lab Results  Component Value Date   WBC 5.1 10/11/2023   HGB 12.5 (L) 10/11/2023   HCT 40.9 10/11/2023   PLT 278 10/11/2023   GLUCOSE 205 (H) 10/11/2023   CHOL 184 02/26/2022   TRIG 203 (H) 02/26/2022   HDL 48 02/26/2022   LDLCALC 101 (H) 02/26/2022   ALT 16 04/10/2018   AST 22 04/10/2018   NA 138 10/11/2023   K 4.6 10/11/2023   CL 100 10/11/2023   CREATININE 1.08 10/11/2023  BUN 15 10/11/2023   CO2 19 (L) 10/11/2023   TSH 0.927 06/03/2019   INR 1.0 02/19/2018   HGBA1C 8.0 (A)  06/11/2022      Assessment / Plan:   62 year old male with three-vessel coronary disease.  He also has claudication and his left lower extremity, and is scheduled for left femoral endarterectomy and femoral to popliteal bypass.,  Review of his left heart catheterization and he has good targets in his LAD, and PDA.  His left heart catheterization reveals normal function without any significant valvular disease.  We discussed risks and benefits of CABG, and I also discussed the possibility of decreased blood flow to his leg in the perioperative period given his severe peripheral vascular disease.  He is agreeable to proceed.     I  spent 40 minutes counseling the patient face to face.   Corliss Skains 11/01/2023 4:02 PM

## 2023-11-08 NOTE — Pre-Procedure Instructions (Signed)
Surgical Instructions   Your procedure is scheduled on November 13, 2023. Report to [facility] Main Entrance "A" at 5:30 A.M., then check in with the Admitting office. Any questions or running late day of surgery: call [phone]  Questions prior to your surgery date: call [phone], Monday-Friday, 8am-4pm. If you experience any cold or flu symptoms such as cough, fever, chills, shortness of breath, etc. between now and your scheduled surgery, please notify us at the above number.     Remember:  Do not eat or drink after midnight the night before your surgery    Take these medicines the morning of surgery with A SIP OF WATER: famotidine (PEPCID)  fluticasone (FLONASE) nasal spray  omeprazole (PRILOSEC)    May take these medicines IF NEEDED: cyclobenzaprine (FLEXERIL)     STOP taking your clopidogrel (PLAVIX) five days prior to surgery. Your last dose will be November 21st.  STOP taking your apixaban Everlene Balls) three days prior to surgery. Your last dose will be November 23rd.   One week prior to surgery, STOP taking any Aspirin (unless otherwise instructed by your surgeon) Aleve, Naproxen, Ibuprofen, Motrin, Advil, Goody's, BC's, all herbal medications, fish oil, and non-prescription vitamins.   WHAT DO I DO ABOUT MY DIABETES MEDICATION?   THE MORNING OF SURGERY, take 20 units of insulin degludec (TRESIBA).  DO NOT take pioglitazone (ACTOS) the morning of surgery   HOW TO MANAGE YOUR DIABETES BEFORE AND AFTER SURGERY  Why is it important to control my blood sugar before and after surgery? Improving blood sugar levels before and after surgery helps healing and can limit problems. A way of improving blood sugar control is eating a healthy diet by:  Eating less sugar and carbohydrates  Increasing activity/exercise  Talking with your doctor about reaching your blood sugar goals High blood sugars (greater than 180 mg/dL) can raise your risk of infections and slow your  recovery, so you will need to focus on controlling your diabetes during the weeks before surgery. Make sure that the doctor who takes care of your diabetes knows about your planned surgery including the date and location.  How do I manage my blood sugar before surgery? Check your blood sugar at least 4 times a day, starting 2 days before surgery, to make sure that the level is not too high or low.  Check your blood sugar the morning of your surgery when you wake up and every 2 hours until you get to the Short Stay unit.  If your blood sugar is less than 70 mg/dL, you will need to treat for low blood sugar: Do not take insulin. Treat a low blood sugar (less than 70 mg/dL) with  cup of clear juice (cranberry or apple), 4 glucose tablets, OR glucose gel. Recheck blood sugar in 15 minutes after treatment (to make sure it is greater than 70 mg/dL). If your blood sugar is not greater than 70 mg/dL on recheck, call [phone] for further instructions. Report your blood sugar to the short stay nurse when you get to Short Stay.  If you are admitted to the hospital after surgery: Your blood sugar will be checked by the staff and you will probably be given insulin after surgery (instead of oral diabetes medicines) to make sure you have good blood sugar levels. The goal for blood sugar control after surgery is 80-180 mg/dL.                      Do NOT Smoke (  Tobacco/Vaping) for 24 hours prior to your procedure.  If you use a CPAP at night, you may bring your mask/headgear for your overnight stay.   You will be asked to remove any contacts, glasses, piercing's, hearing aid's, dentures/partials prior to surgery. Please bring cases for these items if needed.    Patients discharged the day of surgery will not be allowed to drive home, and someone needs to stay with them for 24 hours.  SURGICAL WAITING ROOM VISITATION Patients may have no more than 2 support people in the waiting area - these visitors  may rotate.   Pre-op nurse will coordinate an appropriate time for 1 ADULT support person, who may not rotate, to accompany patient in pre-op.  Children under the age of 11 must have an adult with them who is not the patient and must remain in the main waiting area with an adult.  If the patient needs to stay at the hospital during part of their recovery, the visitor guidelines for inpatient rooms apply.  Please refer to the [facility] website for the visitor guidelines for any additional information.   If you received a COVID test during your pre-op visit  it is requested that you wear a mask when out in public, stay away from anyone that may not be feeling well and notify your surgeon if you develop symptoms. If you have been in contact with anyone that has tested positive in the last 10 days please notify you surgeon.      Pre-operative CHG Bathing Instructions   You can play a key role in reducing the risk of infection after surgery. Your skin needs to be as free of germs as possible. You can reduce the number of germs on your skin by washing with CHG (chlorhexidine gluconate) soap before surgery. CHG is an antiseptic soap that kills germs and continues to kill germs even after washing.   DO NOT use if you have an allergy to chlorhexidine/CHG or antibacterial soaps. If your skin becomes reddened or irritated, stop using the CHG and notify one of our RNs at [phone].              TAKE A SHOWER THE NIGHT BEFORE SURGERY AND THE DAY OF SURGERY    Please keep in mind the following:  DO NOT shave, including legs and underarms, 48 hours prior to surgery.   You may shave your face before/day of surgery.  Place clean sheets on your bed the night before surgery Use a clean washcloth (not used since being washed) for each shower. DO NOT sleep with pet's night before surgery.  CHG Shower Instructions:  Wash your face and private area with normal soap. If you choose to wash your hair, wash  first with your normal shampoo.  After you use shampoo/soap, rinse your hair and body thoroughly to remove shampoo/soap residue.  Turn the water OFF and apply half the bottle of CHG soap to a CLEAN washcloth.  Apply CHG soap ONLY FROM YOUR NECK DOWN TO YOUR TOES (washing for 3-5 minutes)  DO NOT use CHG soap on face, private areas, open wounds, or sores.  Pay special attention to the area where your surgery is being performed.  If you are having back surgery, having someone wash your back for you may be helpful. Wait 2 minutes after CHG soap is applied, then you may rinse off the CHG soap.  Pat dry with a clean towel  Put on clean pajamas    Additional  instructions for the day of surgery: DO NOT APPLY any lotions, deodorants, cologne, or perfumes.   Do not wear jewelry or makeup Do not wear nail polish, gel polish, artificial nails, or any other type of covering on natural nails (fingers and toes) Do not bring valuables to the hospital. [facility] is not responsible for valuables/personal belongings. Put on clean/comfortable clothes.  Please brush your teeth.  Ask your nurse before applying any prescription medications to the skin.

## 2023-11-11 ENCOUNTER — Encounter (HOSPITAL_COMMUNITY)
Admission: RE | Admit: 2023-11-11 | Discharge: 2023-11-11 | Disposition: A | Discharge status: Home / Self Care | Payer: Medicare HMO | Source: Ambulatory Visit | Attending: Thoracic Surgery (Cardiothoracic Vascular Surgery) | Admitting: Thoracic Surgery (Cardiothoracic Vascular Surgery)

## 2023-11-11 ENCOUNTER — Encounter (HOSPITAL_COMMUNITY): Payer: Self-pay

## 2023-11-11 ENCOUNTER — Ambulatory Visit (HOSPITAL_BASED_OUTPATIENT_CLINIC_OR_DEPARTMENT_OTHER)
Admission: RE | Admit: 2023-11-11 | Discharge: 2023-11-11 | Disposition: A | Discharge status: Home / Self Care | Payer: Medicare HMO | Source: Ambulatory Visit | Attending: Thoracic Surgery (Cardiothoracic Vascular Surgery) | Admitting: Thoracic Surgery (Cardiothoracic Vascular Surgery)

## 2023-11-11 ENCOUNTER — Other Ambulatory Visit: Payer: Self-pay

## 2023-11-11 ENCOUNTER — Other Ambulatory Visit (HOSPITAL_COMMUNITY): Payer: Medicare HMO

## 2023-11-11 ENCOUNTER — Ambulatory Visit (HOSPITAL_COMMUNITY)
Admission: RE | Admit: 2023-11-11 | Discharge: 2023-11-11 | Disposition: A | Discharge status: Home / Self Care | Payer: Medicare HMO | Source: Ambulatory Visit | Attending: Thoracic Surgery (Cardiothoracic Vascular Surgery)

## 2023-11-11 DIAGNOSIS — Z01810 Encounter for preprocedural cardiovascular examination: Secondary | ICD-10-CM | POA: Diagnosis not present

## 2023-11-11 DIAGNOSIS — I2510 Atherosclerotic heart disease of native coronary artery without angina pectoris: Secondary | ICD-10-CM | POA: Insufficient documentation

## 2023-11-11 DIAGNOSIS — Z01818 Encounter for other preprocedural examination: Secondary | ICD-10-CM

## 2023-11-11 HISTORY — DX: Atherosclerotic heart disease of native coronary artery without angina pectoris: I25.10

## 2023-11-11 LAB — URINALYSIS, ROUTINE W REFLEX MICROSCOPIC
Bacteria, UA: NONE SEEN
Bilirubin Urine: NEGATIVE
Glucose, UA: >=500 mg/dL — AB
Hgb urine dipstick: NEGATIVE
Ketones, ur: NEGATIVE mg/dL
Leukocytes,Ua: NEGATIVE
Nitrite: NEGATIVE
Protein, ur: NEGATIVE mg/dL
Specific Gravity, Urine: 1.013 (ref 1.005–1.030)
pH: 5 (ref 5.0–8.0)

## 2023-11-11 LAB — CBC
HCT: 43.7 % (ref 39.0–52.0)
Hemoglobin: 13.2 g/dL (ref 13.0–17.0)
MCH: 22 pg — ABNORMAL LOW (ref 26.0–34.0)
MCHC: 30.2 g/dL (ref 30.0–36.0)
MCV: 72.7 fL — ABNORMAL LOW (ref 80.0–100.0)
Platelets: 282 10*3/uL (ref 150–400)
RBC: 6.01 MIL/uL — ABNORMAL HIGH (ref 4.22–5.81)
RDW: 16.3 % — ABNORMAL HIGH (ref 11.5–15.5)
WBC: 4.8 10*3/uL (ref 4.0–10.5)
nRBC: 0 % (ref 0.0–0.2)

## 2023-11-11 LAB — COMPREHENSIVE METABOLIC PANEL
ALT: 21 U/L (ref 0–44)
AST: 21 U/L (ref 15–41)
Albumin: 3.8 g/dL (ref 3.5–5.0)
Alkaline Phosphatase: 75 U/L (ref 38–126)
Anion gap: 8 (ref 5–15)
BUN: 7 mg/dL — ABNORMAL LOW (ref 8–23)
CO2: 26 mmol/L (ref 22–32)
Calcium: 9.5 mg/dL (ref 8.9–10.3)
Chloride: 101 mmol/L (ref 98–111)
Creatinine, Ser: 1.11 mg/dL (ref 0.61–1.24)
GFR, Estimated: >60 mL/min (ref >60)
Glucose, Bld: 178 mg/dL — ABNORMAL HIGH (ref 70–99)
Potassium: 4.2 mmol/L (ref 3.5–5.1)
Sodium: 135 mmol/L (ref 135–145)
Total Bilirubin: 0.3 mg/dL (ref <1.2)
Total Protein: 7.3 g/dL (ref 6.5–8.1)

## 2023-11-11 LAB — TYPE AND SCREEN
ABO/RH(D): O POS
Antibody Screen: NEGATIVE

## 2023-11-11 LAB — SURGICAL PCR SCREEN
MRSA, PCR: NEGATIVE
Staphylococcus aureus: NEGATIVE

## 2023-11-11 LAB — GLUCOSE, CAPILLARY: Glucose-Capillary: 252 mg/dL — ABNORMAL HIGH (ref 70–99)

## 2023-11-11 LAB — APTT: aPTT: 30 s (ref 24–36)

## 2023-11-11 LAB — HEMOGLOBIN A1C
Hgb A1c MFr Bld: 8.7 % — ABNORMAL HIGH (ref 4.8–5.6)
Mean Plasma Glucose: 202.99 mg/dL

## 2023-11-11 LAB — SARS CORONAVIRUS 2 (TAT 6-24 HRS): SARS Coronavirus 2: NEGATIVE

## 2023-11-11 LAB — PROTIME-INR
INR: 1 (ref 0.8–1.2)
Prothrombin Time: 13.5 s (ref 11.4–15.2)

## 2023-11-11 NOTE — Progress Notes (Signed)
Pre-CABG study completed. Please see CV Procedures for preliminary results.  Shona Simpson, RVT 11/11/23 9:49 AM

## 2023-11-11 NOTE — Progress Notes (Addendum)
PCP - Esperanza Sheets, FNP with [facility] in [place], [place] Cardiologist - Dr. Truett Mainland - recently established for current surgical issue  PPM/ICD - Denies Device Orders - n/a Rep Notified - n/a  Chest x-ray - 11/11/2023 EKG - 11/11/2023 Stress Test - 10/07/2023 ECHO - 10/22/2023 Cardiac Cath - 10/16/2023  Sleep Study - Denies CPAP - n/a  Pt is DM2. He checks his blood sugar every two days. Normal fasting sugar is around 150. CBG at pre-op was 252. Pt stated he did not take his Evaristo Bury this morning because his blood sugar was "too low." He had pancakes and syrup for breakfast around 0730. A1c result is pending. CBG discussed with Antionette Poles, PA-C. Pt educated/instructed to eat less sugar and carbs leading up to surgery. Also educated that high blood sugars impact the bodies ability to appropriately heal after surgery. Pt understood all instructions. Pt states that he sometimes takes his Actos when his blood sugar is high, but he does not take it everyday.  Last dose of GLP1 agonist-  n/a GLP1 instructions: n/a  Blood Thinner Instructions: Pt instructed to hold Plavix for 5 days (last dose was Nov. 21st) and hold Eliquis for 3 days (Last dose was Nov. 23rd) Aspirin Instructions: n/a  NPO after midnight  COVID TEST- Yes. Result pending.   Anesthesia review: Yes. Pt was pending Vascular surgery when obstructive CAD was found. He is completing OHS before he can have Vascular surgery. Pt established with cardiology with cardiac testing completed. CBG high at PAT appointment. EKG result abnormal. A1c 8.7 and Darius Bump, RN with [facility] made aware  Patient denies shortness of breath, fever, cough and chest pain at PAT appointment. Pt denies any respiratory illness/infection in the last two months.    All instructions explained to the patient, with a verbal understanding of the material. Patient agrees to go over the instructions while at home for a better understanding.  Patient also instructed to self quarantine after being tested for COVID-19. The opportunity to ask questions was provided.

## 2023-11-12 LAB — VAS US DOPPLER PRE CABG

## 2023-11-12 MED ORDER — CEFAZOLIN SODIUM-DEXTROSE 2-4 GM/100ML-% IV SOLN
2.0000 g | INTRAVENOUS | Status: AC
  Administered 2023-11-13: 2 g via INTRAVENOUS
  Filled 2023-11-12: qty 100

## 2023-11-12 MED ORDER — TRANEXAMIC ACID 1000 MG/10ML IV SOLN
1.5000 mg/kg/h | INTRAVENOUS | Status: AC
  Administered 2023-11-13: 1.5 mg/kg/h via INTRAVENOUS
  Filled 2023-11-12: qty 25

## 2023-11-12 MED ORDER — POTASSIUM CHLORIDE 2 MEQ/ML IV SOLN
80.0000 meq | INTRAVENOUS | Status: DC
  Filled 2023-11-12: qty 40

## 2023-11-12 MED ORDER — TRANEXAMIC ACID (OHS) PUMP PRIME SOLUTION
2.0000 mg/kg | INTRAVENOUS | Status: DC
  Filled 2023-11-12: qty 1.69

## 2023-11-12 MED ORDER — DEXMEDETOMIDINE HCL IN NACL 400 MCG/100ML IV SOLN
0.1000 ug/kg/h | INTRAVENOUS | Status: AC
  Administered 2023-11-13: .7 ug/kg/h via INTRAVENOUS
  Filled 2023-11-12: qty 100

## 2023-11-12 MED ORDER — MILRINONE LACTATE IN DEXTROSE 20-5 MG/100ML-% IV SOLN
0.3000 ug/kg/min | INTRAVENOUS | Status: DC
  Filled 2023-11-12: qty 100

## 2023-11-12 MED ORDER — HEPARIN 30,000 UNITS/1000 ML (OHS) CELLSAVER SOLUTION
Status: DC
  Filled 2023-11-12: qty 1000

## 2023-11-12 MED ORDER — EPINEPHRINE HCL 5 MG/250ML IV SOLN IN NS
0.0000 ug/min | INTRAVENOUS | Status: DC
  Filled 2023-11-12: qty 250

## 2023-11-12 MED ORDER — INSULIN REGULAR(HUMAN) IN NACL 100-0.9 UT/100ML-% IV SOLN
INTRAVENOUS | Status: AC
  Administered 2023-11-13: 1.7 [IU]/h via INTRAVENOUS
  Filled 2023-11-12: qty 100

## 2023-11-12 MED ORDER — VANCOMYCIN HCL 1500 MG/300ML IV SOLN
1500.0000 mg | INTRAVENOUS | Status: AC
  Administered 2023-11-13: 1500 mg via INTRAVENOUS
  Filled 2023-11-12: qty 300

## 2023-11-12 MED ORDER — NITROGLYCERIN IN D5W 200-5 MCG/ML-% IV SOLN
2.0000 ug/min | INTRAVENOUS | Status: DC
  Filled 2023-11-12: qty 250

## 2023-11-12 MED ORDER — PHENYLEPHRINE HCL-NACL 20-0.9 MG/250ML-% IV SOLN
30.0000 ug/min | INTRAVENOUS | Status: AC
  Administered 2023-11-13: 40 ug/min via INTRAVENOUS
  Filled 2023-11-12: qty 250

## 2023-11-12 MED ORDER — MANNITOL 20 % IV SOLN
INTRAVENOUS | Status: DC
  Filled 2023-11-12: qty 13

## 2023-11-12 MED ORDER — PLASMA-LYTE A IV SOLN
INTRAVENOUS | Status: DC
  Filled 2023-11-12: qty 2.5

## 2023-11-12 MED ORDER — NOREPINEPHRINE 4 MG/250ML-% IV SOLN
0.0000 ug/min | INTRAVENOUS | Status: DC
  Filled 2023-11-12: qty 250

## 2023-11-12 MED ORDER — TRANEXAMIC ACID (OHS) BOLUS VIA INFUSION
15.0000 mg/kg | INTRAVENOUS | Status: AC
  Administered 2023-11-13: 1264.5 mg via INTRAVENOUS
  Filled 2023-11-12: qty 1265

## 2023-11-12 NOTE — Anesthesia Preprocedure Evaluation (Signed)
Anesthesia Evaluation  Patient identified by MRN, date of birth, ID band Patient awake    Reviewed: Allergy & Precautions, H&P , NPO status , Patient's Chart, lab work & pertinent test results  Airway Mallampati: II  TM Distance: >3 FB Neck ROM: Full    Dental  (+) Edentulous Upper, Poor Dentition, Dental Advisory Given   Pulmonary Current Smoker and Patient abstained from smoking.   Pulmonary exam normal breath sounds clear to auscultation       Cardiovascular hypertension, + CAD and + Peripheral Vascular Disease  Normal cardiovascular exam Rhythm:Regular Rate:Normal  1. Left ventricular ejection fraction, by estimation, is 55 to 60%. The  left ventricle has normal function. The left ventricle has no regional  wall motion abnormalities. Left ventricular diastolic parameters are  consistent with Grade I diastolic  dysfunction (impaired relaxation). The average left ventricular global  longitudinal strain is -22.0 %. The global longitudinal strain is normal.   2. Right ventricular systolic function is normal. The right ventricular  size is normal.   3. The mitral valve is normal in structure. Trivial mitral valve  regurgitation. No evidence of mitral stenosis.   4. The aortic valve is tricuspid. Aortic valve regurgitation is not  visualized. Aortic valve sclerosis/calcification is present, without any  evidence of aortic stenosis.   5. The inferior vena cava is normal in size with greater than 50%  respiratory variability, suggesting right atrial pressure of 3 mmHg.     Neuro/Psych negative neurological ROS  negative psych ROS   GI/Hepatic ,GERD  Medicated,,(+) Hepatitis -, C  Endo/Other  diabetes, Type 2    Renal/GU negative Renal ROS  negative genitourinary   Musculoskeletal  (+) Arthritis ,    Abdominal   Peds negative pediatric ROS (+)  Hematology  (+) Blood dyscrasia, anemia   Anesthesia Other Findings    Reproductive/Obstetrics negative OB ROS                             Anesthesia Physical Anesthesia Plan  ASA: 4  Anesthesia Plan: General   Post-op Pain Management:    Induction: Intravenous  PONV Risk Score and Plan: 3 and Ondansetron, Dexamethasone, Midazolam and Treatment may vary due to age or medical condition  Airway Management Planned: Oral ETT  Additional Equipment: Arterial line, CVP, 3D TEE and Ultrasound Guidance Line Placement  Intra-op Plan:   Post-operative Plan: Post-operative intubation/ventilation  Informed Consent: I have reviewed the patients History and Physical, chart, labs and discussed the procedure including the risks, benefits and alternatives for the proposed anesthesia with the patient or authorized representative who has indicated his/her understanding and acceptance.     Dental advisory given  Plan Discussed with: CRNA  Anesthesia Plan Comments:         Anesthesia Quick Evaluation

## 2023-11-13 ENCOUNTER — Other Ambulatory Visit: Payer: Self-pay

## 2023-11-13 ENCOUNTER — Inpatient Hospital Stay (HOSPITAL_COMMUNITY): Payer: Medicaid Other | Admitting: Physician Assistant

## 2023-11-13 ENCOUNTER — Inpatient Hospital Stay (HOSPITAL_COMMUNITY)
Admission: RE | Admit: 2023-11-13 | Discharge: 2023-11-18 | DRG: 236 | Disposition: A | Discharge status: Home / Self Care | Payer: Medicare HMO | Attending: Thoracic Surgery (Cardiothoracic Vascular Surgery) | Admitting: Thoracic Surgery (Cardiothoracic Vascular Surgery)

## 2023-11-13 ENCOUNTER — Inpatient Hospital Stay (HOSPITAL_COMMUNITY): Payer: Self-pay | Admitting: Anesthesiology

## 2023-11-13 ENCOUNTER — Inpatient Hospital Stay (HOSPITAL_COMMUNITY): Payer: Medicare HMO

## 2023-11-13 ENCOUNTER — Inpatient Hospital Stay (HOSPITAL_COMMUNITY)
Admission: RE | Disposition: A | Discharge status: Home / Self Care | Payer: Self-pay | Source: Home / Self Care | Attending: Thoracic Surgery (Cardiothoracic Vascular Surgery)

## 2023-11-13 ENCOUNTER — Encounter (HOSPITAL_COMMUNITY): Payer: Self-pay | Admitting: Thoracic Surgery (Cardiothoracic Vascular Surgery)

## 2023-11-13 DIAGNOSIS — Z7901 Long term (current) use of anticoagulants: Secondary | ICD-10-CM

## 2023-11-13 DIAGNOSIS — I2510 Atherosclerotic heart disease of native coronary artery without angina pectoris: Secondary | ICD-10-CM

## 2023-11-13 DIAGNOSIS — Z951 Presence of aortocoronary bypass graft: Secondary | ICD-10-CM | POA: Diagnosis present

## 2023-11-13 DIAGNOSIS — F17210 Nicotine dependence, cigarettes, uncomplicated: Secondary | ICD-10-CM

## 2023-11-13 DIAGNOSIS — Z888 Allergy status to other drugs, medicaments and biological substances status: Secondary | ICD-10-CM | POA: Diagnosis not present

## 2023-11-13 DIAGNOSIS — E1151 Type 2 diabetes mellitus with diabetic peripheral angiopathy without gangrene: Secondary | ICD-10-CM | POA: Diagnosis present

## 2023-11-13 DIAGNOSIS — Z7984 Long term (current) use of oral hypoglycemic drugs: Secondary | ICD-10-CM | POA: Diagnosis not present

## 2023-11-13 DIAGNOSIS — M1990 Unspecified osteoarthritis, unspecified site: Secondary | ICD-10-CM | POA: Diagnosis present

## 2023-11-13 DIAGNOSIS — Z825 Family history of asthma and other chronic lower respiratory diseases: Secondary | ICD-10-CM | POA: Diagnosis not present

## 2023-11-13 DIAGNOSIS — E871 Hypo-osmolality and hyponatremia: Secondary | ICD-10-CM | POA: Diagnosis present

## 2023-11-13 DIAGNOSIS — Z8249 Family history of ischemic heart disease and other diseases of the circulatory system: Secondary | ICD-10-CM | POA: Diagnosis not present

## 2023-11-13 DIAGNOSIS — I739 Peripheral vascular disease, unspecified: Secondary | ICD-10-CM | POA: Diagnosis not present

## 2023-11-13 DIAGNOSIS — Z79899 Other long term (current) drug therapy: Secondary | ICD-10-CM | POA: Diagnosis not present

## 2023-11-13 DIAGNOSIS — M109 Gout, unspecified: Secondary | ICD-10-CM | POA: Diagnosis present

## 2023-11-13 DIAGNOSIS — I10 Essential (primary) hypertension: Secondary | ICD-10-CM | POA: Diagnosis present

## 2023-11-13 DIAGNOSIS — E785 Hyperlipidemia, unspecified: Secondary | ICD-10-CM | POA: Diagnosis present

## 2023-11-13 DIAGNOSIS — Z8639 Personal history of other endocrine, nutritional and metabolic disease: Secondary | ICD-10-CM | POA: Diagnosis not present

## 2023-11-13 DIAGNOSIS — Z8619 Personal history of other infectious and parasitic diseases: Secondary | ICD-10-CM

## 2023-11-13 DIAGNOSIS — I998 Other disorder of circulatory system: Secondary | ICD-10-CM | POA: Diagnosis not present

## 2023-11-13 DIAGNOSIS — Z794 Long term (current) use of insulin: Secondary | ICD-10-CM | POA: Diagnosis not present

## 2023-11-13 DIAGNOSIS — R5082 Postprocedural fever: Secondary | ICD-10-CM | POA: Diagnosis not present

## 2023-11-13 DIAGNOSIS — K219 Gastro-esophageal reflux disease without esophagitis: Secondary | ICD-10-CM | POA: Diagnosis present

## 2023-11-13 DIAGNOSIS — R739 Hyperglycemia, unspecified: Secondary | ICD-10-CM | POA: Diagnosis not present

## 2023-11-13 DIAGNOSIS — Z833 Family history of diabetes mellitus: Secondary | ICD-10-CM

## 2023-11-13 DIAGNOSIS — J9811 Atelectasis: Secondary | ICD-10-CM | POA: Diagnosis not present

## 2023-11-13 DIAGNOSIS — Z7902 Long term (current) use of antithrombotics/antiplatelets: Secondary | ICD-10-CM | POA: Diagnosis not present

## 2023-11-13 HISTORY — PX: CORONARY ARTERY BYPASS GRAFT: SHX141

## 2023-11-13 HISTORY — PX: TEE WITHOUT CARDIOVERSION: SHX5443

## 2023-11-13 LAB — POCT I-STAT 7, (LYTES, BLD GAS, ICA,H+H)
Acid-base deficit: 1 mmol/L (ref 0.0–2.0)
Acid-base deficit: 2 mmol/L (ref 0.0–2.0)
Acid-base deficit: 2 mmol/L (ref 0.0–2.0)
Acid-base deficit: 6 mmol/L — ABNORMAL HIGH (ref 0.0–2.0)
Bicarbonate: 23.5 mmol/L (ref 20.0–28.0)
Bicarbonate: 23 mmol/L (ref 20.0–28.0)
Bicarbonate: 23.5 mmol/L (ref 20.0–28.0)
Bicarbonate: 19.6 mmol/L — ABNORMAL LOW (ref 20.0–28.0)
Calcium, Ion: 1.09 mmol/L — ABNORMAL LOW (ref 1.15–1.40)
Calcium, Ion: 1.32 mmol/L (ref 1.15–1.40)
Calcium, Ion: 1.29 mmol/L (ref 1.15–1.40)
Calcium, Ion: 1.22 mmol/L (ref 1.15–1.40)
HCT: 31 % — ABNORMAL LOW (ref 39.0–52.0)
HCT: 30 % — ABNORMAL LOW (ref 39.0–52.0)
HCT: 35 % — ABNORMAL LOW (ref 39.0–52.0)
HCT: 38 % — ABNORMAL LOW (ref 39.0–52.0)
Hemoglobin: 10.5 g/dL — ABNORMAL LOW (ref 13.0–17.0)
Hemoglobin: 10.2 g/dL — ABNORMAL LOW (ref 13.0–17.0)
Hemoglobin: 11.9 g/dL — ABNORMAL LOW (ref 13.0–17.0)
Hemoglobin: 12.9 g/dL — ABNORMAL LOW (ref 13.0–17.0)
O2 Saturation: 100 %
O2 Saturation: 99 %
O2 Saturation: 99 %
O2 Saturation: 92 %
Patient temperature: 36.4
Patient temperature: 37.7
Potassium: 3.7 mmol/L (ref 3.5–5.1)
Potassium: 4 mmol/L (ref 3.5–5.1)
Potassium: 4 mmol/L (ref 3.5–5.1)
Potassium: 4 mmol/L (ref 3.5–5.1)
Sodium: 139 mmol/L (ref 135–145)
Sodium: 141 mmol/L (ref 135–145)
Sodium: 141 mmol/L (ref 135–145)
Sodium: 140 mmol/L (ref 135–145)
TCO2: 25 mmol/L (ref 22–32)
TCO2: 24 mmol/L (ref 22–32)
TCO2: 25 mmol/L (ref 22–32)
TCO2: 21 mmol/L — ABNORMAL LOW (ref 22–32)
pCO2 arterial: 39.5 mm[Hg] (ref 32–48)
pCO2 arterial: 38.3 mm[Hg] (ref 32–48)
pCO2 arterial: 40.4 mm[Hg] (ref 32–48)
pCO2 arterial: 40.1 mm[Hg] (ref 32–48)
pH, Arterial: 7.383 (ref 7.35–7.45)
pH, Arterial: 7.387 (ref 7.35–7.45)
pH, Arterial: 7.369 (ref 7.35–7.45)
pH, Arterial: 7.301 — ABNORMAL LOW (ref 7.35–7.45)
pO2, Arterial: 251 mm[Hg] — ABNORMAL HIGH (ref 83–108)
pO2, Arterial: 141 mm[Hg] — ABNORMAL HIGH (ref 83–108)
pO2, Arterial: 150 mm[Hg] — ABNORMAL HIGH (ref 83–108)
pO2, Arterial: 72 mm[Hg] — ABNORMAL LOW (ref 83–108)

## 2023-11-13 LAB — POCT I-STAT, CHEM 8
BUN: 7 mg/dL — ABNORMAL LOW (ref 8–23)
BUN: 7 mg/dL — ABNORMAL LOW (ref 8–23)
BUN: 7 mg/dL — ABNORMAL LOW (ref 8–23)
BUN: 7 mg/dL — ABNORMAL LOW (ref 8–23)
Calcium, Ion: 1.22 mmol/L (ref 1.15–1.40)
Calcium, Ion: 1.26 mmol/L (ref 1.15–1.40)
Calcium, Ion: 1.12 mmol/L — ABNORMAL LOW (ref 1.15–1.40)
Calcium, Ion: 1.31 mmol/L (ref 1.15–1.40)
Chloride: 104 mmol/L (ref 98–111)
Chloride: 105 mmol/L (ref 98–111)
Chloride: 104 mmol/L (ref 98–111)
Chloride: 106 mmol/L (ref 98–111)
Creatinine, Ser: 0.9 mg/dL (ref 0.61–1.24)
Creatinine, Ser: 0.9 mg/dL (ref 0.61–1.24)
Creatinine, Ser: 0.8 mg/dL (ref 0.61–1.24)
Creatinine, Ser: 0.8 mg/dL (ref 0.61–1.24)
Glucose, Bld: 104 mg/dL — ABNORMAL HIGH (ref 70–99)
Glucose, Bld: 106 mg/dL — ABNORMAL HIGH (ref 70–99)
Glucose, Bld: 88 mg/dL (ref 70–99)
Glucose, Bld: 97 mg/dL (ref 70–99)
HCT: 38 % — ABNORMAL LOW (ref 39.0–52.0)
HCT: 39 % (ref 39.0–52.0)
HCT: 31 % — ABNORMAL LOW (ref 39.0–52.0)
HCT: 32 % — ABNORMAL LOW (ref 39.0–52.0)
Hemoglobin: 12.9 g/dL — ABNORMAL LOW (ref 13.0–17.0)
Hemoglobin: 13.3 g/dL (ref 13.0–17.0)
Hemoglobin: 10.5 g/dL — ABNORMAL LOW (ref 13.0–17.0)
Hemoglobin: 10.9 g/dL — ABNORMAL LOW (ref 13.0–17.0)
Potassium: 3.9 mmol/L (ref 3.5–5.1)
Potassium: 4 mmol/L (ref 3.5–5.1)
Potassium: 4 mmol/L (ref 3.5–5.1)
Potassium: 4.6 mmol/L (ref 3.5–5.1)
Sodium: 139 mmol/L (ref 135–145)
Sodium: 140 mmol/L (ref 135–145)
Sodium: 138 mmol/L (ref 135–145)
Sodium: 141 mmol/L (ref 135–145)
TCO2: 24 mmol/L (ref 22–32)
TCO2: 25 mmol/L (ref 22–32)
TCO2: 21 mmol/L — ABNORMAL LOW (ref 22–32)
TCO2: 26 mmol/L (ref 22–32)

## 2023-11-13 LAB — COMPREHENSIVE METABOLIC PANEL
ALT: 17 U/L (ref 0–44)
AST: 33 U/L (ref 15–41)
Albumin: 3.4 g/dL — ABNORMAL LOW (ref 3.5–5.0)
Alkaline Phosphatase: 47 U/L (ref 38–126)
Anion gap: 8 (ref 5–15)
BUN: 7 mg/dL — ABNORMAL LOW (ref 8–23)
CO2: 19 mmol/L — ABNORMAL LOW (ref 22–32)
Calcium: 8.2 mg/dL — ABNORMAL LOW (ref 8.9–10.3)
Chloride: 108 mmol/L (ref 98–111)
Creatinine, Ser: 0.99 mg/dL (ref 0.61–1.24)
GFR, Estimated: >60 mL/min (ref >60)
Glucose, Bld: 137 mg/dL — ABNORMAL HIGH (ref 70–99)
Potassium: 4.4 mmol/L (ref 3.5–5.1)
Sodium: 135 mmol/L (ref 135–145)
Total Bilirubin: 0.5 mg/dL (ref <1.2)
Total Protein: 5.9 g/dL — ABNORMAL LOW (ref 6.5–8.1)

## 2023-11-13 LAB — CBC
HCT: 31.7 % — ABNORMAL LOW (ref 39.0–52.0)
HCT: 37.2 % — ABNORMAL LOW (ref 39.0–52.0)
Hemoglobin: 9.8 g/dL — ABNORMAL LOW (ref 13.0–17.0)
Hemoglobin: 11.5 g/dL — ABNORMAL LOW (ref 13.0–17.0)
MCH: 22.1 pg — ABNORMAL LOW (ref 26.0–34.0)
MCH: 22.3 pg — ABNORMAL LOW (ref 26.0–34.0)
MCHC: 30.9 g/dL (ref 30.0–36.0)
MCHC: 30.9 g/dL (ref 30.0–36.0)
MCV: 71.6 fL — ABNORMAL LOW (ref 80.0–100.0)
MCV: 72.2 fL — ABNORMAL LOW (ref 80.0–100.0)
Platelets: 173 10*3/uL (ref 150–400)
Platelets: 219 10*3/uL (ref 150–400)
RBC: 4.43 MIL/uL (ref 4.22–5.81)
RBC: 5.15 MIL/uL (ref 4.22–5.81)
RDW: 15.8 % — ABNORMAL HIGH (ref 11.5–15.5)
RDW: 15.9 % — ABNORMAL HIGH (ref 11.5–15.5)
WBC: 7.9 10*3/uL (ref 4.0–10.5)
WBC: 11.5 10*3/uL — ABNORMAL HIGH (ref 4.0–10.5)
nRBC: 0 % (ref 0.0–0.2)
nRBC: 0 % (ref 0.0–0.2)

## 2023-11-13 LAB — HEMOGLOBIN AND HEMATOCRIT, BLOOD
HCT: 31.6 % — ABNORMAL LOW (ref 39.0–52.0)
Hemoglobin: 9.8 g/dL — ABNORMAL LOW (ref 13.0–17.0)

## 2023-11-13 LAB — GLUCOSE, CAPILLARY
Glucose-Capillary: 103 mg/dL — ABNORMAL HIGH (ref 70–99)
Glucose-Capillary: 93 mg/dL (ref 70–99)
Glucose-Capillary: 85 mg/dL (ref 70–99)
Glucose-Capillary: 75 mg/dL (ref 70–99)
Glucose-Capillary: 107 mg/dL — ABNORMAL HIGH (ref 70–99)
Glucose-Capillary: 111 mg/dL — ABNORMAL HIGH (ref 70–99)
Glucose-Capillary: 98 mg/dL (ref 70–99)
Glucose-Capillary: 132 mg/dL — ABNORMAL HIGH (ref 70–99)
Glucose-Capillary: 156 mg/dL — ABNORMAL HIGH (ref 70–99)
Glucose-Capillary: 114 mg/dL — ABNORMAL HIGH (ref 70–99)
Glucose-Capillary: 116 mg/dL — ABNORMAL HIGH (ref 70–99)
Glucose-Capillary: 116 mg/dL — ABNORMAL HIGH (ref 70–99)

## 2023-11-13 LAB — POCT I-STAT EG7
Acid-base deficit: 1 mmol/L (ref 0.0–2.0)
Bicarbonate: 24.6 mmol/L (ref 20.0–28.0)
Calcium, Ion: 1.11 mmol/L — ABNORMAL LOW (ref 1.15–1.40)
HCT: 31 % — ABNORMAL LOW (ref 39.0–52.0)
Hemoglobin: 10.5 g/dL — ABNORMAL LOW (ref 13.0–17.0)
O2 Saturation: 73 %
Potassium: 5.3 mmol/L — ABNORMAL HIGH (ref 3.5–5.1)
Sodium: 139 mmol/L (ref 135–145)
TCO2: 26 mmol/L (ref 22–32)
pCO2, Ven: 44.1 mm[Hg] (ref 44–60)
pH, Ven: 7.355 (ref 7.25–7.43)
pO2, Ven: 41 mm[Hg] (ref 32–45)

## 2023-11-13 LAB — PLATELET COUNT: Platelets: 219 10*3/uL (ref 150–400)

## 2023-11-13 LAB — PROTIME-INR
INR: 1.2 (ref 0.8–1.2)
Prothrombin Time: 15.7 s — ABNORMAL HIGH (ref 11.4–15.2)

## 2023-11-13 LAB — ECHO INTRAOPERATIVE TEE
Height: 69 in
Weight: 3008 [oz_av]

## 2023-11-13 LAB — ABO/RH: ABO/RH(D): O POS

## 2023-11-13 LAB — MAGNESIUM: Magnesium: 2.4 mg/dL (ref 1.7–2.4)

## 2023-11-13 LAB — APTT: aPTT: 32 s (ref 24–36)

## 2023-11-13 SURGERY — CORONARY ARTERY BYPASS GRAFTING (CABG)
Anesthesia: General | Site: Chest

## 2023-11-13 MED ORDER — CHLORHEXIDINE GLUCONATE CLOTH 2 % EX PADS
6.0000 | MEDICATED_PAD | Freq: Every day | CUTANEOUS | Status: DC
  Administered 2023-11-14 – 2023-11-17 (×5): 6 via TOPICAL

## 2023-11-13 MED ORDER — ATORVASTATIN CALCIUM 80 MG PO TABS
80.0000 mg | ORAL_TABLET | Freq: Every day | ORAL | Status: DC
Start: 2023-11-14 — End: 2023-11-18
  Administered 2023-11-14 – 2023-11-17 (×4): 80 mg via ORAL
  Filled 2023-11-13 (×4): qty 1

## 2023-11-13 MED ORDER — ASPIRIN 81 MG PO CHEW
324.0000 mg | CHEWABLE_TABLET | Freq: Once | ORAL | Status: AC
  Administered 2023-11-13: 324 mg via ORAL
  Filled 2023-11-13: qty 4

## 2023-11-13 MED ORDER — SODIUM CHLORIDE 0.45 % IV SOLN: INTRAVENOUS | Status: AC | PRN

## 2023-11-13 MED ORDER — ALBUMIN HUMAN 5 % IV SOLN: INTRAVENOUS | Status: DC | PRN

## 2023-11-13 MED ORDER — 0.9 % SODIUM CHLORIDE (POUR BTL) OPTIME
TOPICAL | Status: DC | PRN
  Administered 2023-11-13: 5000 mL

## 2023-11-13 MED ORDER — PROPOFOL 10 MG/ML IV BOLUS
INTRAVENOUS | Status: AC
  Filled 2023-11-13: qty 20

## 2023-11-13 MED ORDER — SUGAMMADEX SODIUM 200 MG/2ML IV SOLN
INTRAVENOUS | Status: DC | PRN
  Administered 2023-11-13: 200 mg via INTRAVENOUS

## 2023-11-13 MED ORDER — ASPIRIN 325 MG PO TBEC
325.0000 mg | DELAYED_RELEASE_TABLET | Freq: Every day | ORAL | Status: DC
  Administered 2023-11-14: 325 mg via ORAL
  Filled 2023-11-13: qty 1

## 2023-11-13 MED ORDER — ~~LOC~~ CARDIAC SURGERY, PATIENT & FAMILY EDUCATION
Freq: Once | Status: DC
  Filled 2023-11-13: qty 1

## 2023-11-13 MED ORDER — TRAMADOL HCL 50 MG PO TABS
50.0000 mg | ORAL_TABLET | ORAL | Status: DC | PRN
Start: 2023-11-13 — End: 2023-11-18
  Administered 2023-11-14 – 2023-11-18 (×2): 100 mg via ORAL
  Filled 2023-11-13 (×2): qty 2

## 2023-11-13 MED ORDER — METOCLOPRAMIDE HCL 5 MG/ML IJ SOLN
10.0000 mg | Freq: Four times a day (QID) | INTRAMUSCULAR | Status: AC
  Administered 2023-11-13 – 2023-11-14 (×6): 10 mg via INTRAVENOUS
  Filled 2023-11-13 (×6): qty 2

## 2023-11-13 MED ORDER — VANCOMYCIN HCL IN DEXTROSE 1-5 GM/200ML-% IV SOLN
1000.0000 mg | Freq: Once | INTRAVENOUS | Status: AC
  Administered 2023-11-13: 1000 mg via INTRAVENOUS
  Filled 2023-11-13: qty 200

## 2023-11-13 MED ORDER — ROCURONIUM BROMIDE 10 MG/ML (PF) SYRINGE
PREFILLED_SYRINGE | INTRAVENOUS | Status: DC | PRN
  Administered 2023-11-13: 50 mg via INTRAVENOUS
  Administered 2023-11-13: 30 mg via INTRAVENOUS
  Administered 2023-11-13: 100 mg via INTRAVENOUS

## 2023-11-13 MED ORDER — ASPIRIN 81 MG PO CHEW: 324.0000 mg | CHEWABLE_TABLET | Freq: Every day | ORAL | Status: DC

## 2023-11-13 MED ORDER — CHLORHEXIDINE GLUCONATE 0.12 % MT SOLN
15.0000 mL | Freq: Once | OROMUCOSAL | Status: AC
  Filled 2023-11-13: qty 15

## 2023-11-13 MED ORDER — CHLORHEXIDINE GLUCONATE 0.12 % MT SOLN
15.0000 mL | OROMUCOSAL | Status: AC
  Administered 2023-11-13: 15 mL via OROMUCOSAL
  Filled 2023-11-13: qty 15

## 2023-11-13 MED ORDER — PLASMA-LYTE A IV SOLN
INTRAVENOUS | Status: DC | PRN
  Administered 2023-11-13: 500 mL via INTRAVASCULAR

## 2023-11-13 MED ORDER — METOPROLOL TARTRATE 12.5 MG HALF TABLET
12.5000 mg | ORAL_TABLET | Freq: Two times a day (BID) | ORAL | Status: DC
Start: 2023-11-13 — End: 2023-11-15
  Administered 2023-11-14 (×2): 12.5 mg via ORAL
  Filled 2023-11-13 (×2): qty 1

## 2023-11-13 MED ORDER — PANTOPRAZOLE SODIUM 40 MG IV SOLR
40.0000 mg | Freq: Every day | INTRAVENOUS | Status: AC
  Administered 2023-11-13 – 2023-11-14 (×2): 40 mg via INTRAVENOUS
  Filled 2023-11-13 (×2): qty 10

## 2023-11-13 MED ORDER — LACTATED RINGERS IV SOLN: INTRAVENOUS | Status: DC | PRN

## 2023-11-13 MED ORDER — INSULIN REGULAR(HUMAN) IN NACL 100-0.9 UT/100ML-% IV SOLN: INTRAVENOUS | Status: DC

## 2023-11-13 MED ORDER — ORAL CARE MOUTH RINSE: 15.0000 mL | Freq: Once | OROMUCOSAL | Status: AC

## 2023-11-13 MED ORDER — OXYCODONE HCL 5 MG PO TABS
5.0000 mg | ORAL_TABLET | ORAL | Status: DC | PRN
  Administered 2023-11-13 – 2023-11-14 (×3): 10 mg via ORAL
  Administered 2023-11-14 (×2): 5 mg via ORAL
  Administered 2023-11-15 – 2023-11-18 (×9): 10 mg via ORAL
  Filled 2023-11-13: qty 1
  Filled 2023-11-13 (×6): qty 2
  Filled 2023-11-13: qty 1
  Filled 2023-11-13 (×7): qty 2

## 2023-11-13 MED ORDER — SODIUM CHLORIDE 0.9% FLUSH: 3.0000 mL | INTRAVENOUS | Status: DC | PRN

## 2023-11-13 MED ORDER — PHENYLEPHRINE 80 MCG/ML (10ML) SYRINGE FOR IV PUSH (FOR BLOOD PRESSURE SUPPORT)
PREFILLED_SYRINGE | INTRAVENOUS | Status: DC | PRN
  Administered 2023-11-13: 40 ug via INTRAVENOUS
  Administered 2023-11-13: 80 ug via INTRAVENOUS

## 2023-11-13 MED ORDER — PHENYLEPHRINE HCL-NACL 20-0.9 MG/250ML-% IV SOLN
0.0000 ug/min | INTRAVENOUS | Status: DC
  Administered 2023-11-13: 10 ug/min via INTRAVENOUS

## 2023-11-13 MED ORDER — FENTANYL CITRATE (PF) 250 MCG/5ML IJ SOLN
INTRAMUSCULAR | Status: DC | PRN
  Administered 2023-11-13: 50 ug via INTRAVENOUS
  Administered 2023-11-13: 150 ug via INTRAVENOUS
  Administered 2023-11-13: 100 ug via INTRAVENOUS
  Administered 2023-11-13: 150 ug via INTRAVENOUS
  Administered 2023-11-13: 50 ug via INTRAVENOUS

## 2023-11-13 MED ORDER — SODIUM CHLORIDE 0.9 % IV SOLN: INTRAVENOUS | Status: DC | PRN

## 2023-11-13 MED ORDER — MAGNESIUM SULFATE 4 GM/100ML IV SOLN
4.0000 g | Freq: Once | INTRAVENOUS | Status: AC
  Administered 2023-11-13: 4 g via INTRAVENOUS
  Filled 2023-11-13: qty 100

## 2023-11-13 MED ORDER — DEXMEDETOMIDINE HCL IN NACL 400 MCG/100ML IV SOLN: 0.0000 ug/kg/h | INTRAVENOUS | Status: DC

## 2023-11-13 MED ORDER — ORAL CARE MOUTH RINSE
15.0000 mL | OROMUCOSAL | Status: DC
  Administered 2023-11-13 – 2023-11-18 (×19): 15 mL via OROMUCOSAL

## 2023-11-13 MED ORDER — PROPOFOL 10 MG/ML IV BOLUS
INTRAVENOUS | Status: DC | PRN
  Administered 2023-11-13: 100 mg via INTRAVENOUS

## 2023-11-13 MED ORDER — CEFAZOLIN SODIUM-DEXTROSE 2-4 GM/100ML-% IV SOLN
2.0000 g | Freq: Three times a day (TID) | INTRAVENOUS | Status: AC
  Administered 2023-11-13 – 2023-11-15 (×6): 2 g via INTRAVENOUS
  Filled 2023-11-13 (×6): qty 100

## 2023-11-13 MED ORDER — MIDAZOLAM HCL (PF) 5 MG/ML IJ SOLN
INTRAMUSCULAR | Status: DC | PRN
  Administered 2023-11-13: 2 mg via INTRAVENOUS

## 2023-11-13 MED ORDER — ONDANSETRON HCL 4 MG/2ML IJ SOLN
INTRAMUSCULAR | Status: DC | PRN
  Administered 2023-11-13: 4 mg via INTRAVENOUS

## 2023-11-13 MED ORDER — ACETAMINOPHEN 325 MG PO TABS
650.0000 mg | ORAL_TABLET | Freq: Once | ORAL | Status: AC
  Administered 2023-11-13: 650 mg via ORAL
  Filled 2023-11-13: qty 2

## 2023-11-13 MED ORDER — METOPROLOL TARTRATE 12.5 MG HALF TABLET: 12.5000 mg | ORAL_TABLET | Freq: Once | ORAL | Status: DC

## 2023-11-13 MED ORDER — INSULIN ASPART 100 UNIT/ML IJ SOLN: 0.0000 [IU] | INTRAMUSCULAR | Status: DC | PRN

## 2023-11-13 MED ORDER — DOCUSATE SODIUM 100 MG PO CAPS
200.0000 mg | ORAL_CAPSULE | Freq: Every day | ORAL | Status: DC
  Administered 2023-11-14 – 2023-11-18 (×5): 200 mg via ORAL
  Filled 2023-11-13 (×5): qty 2

## 2023-11-13 MED ORDER — MIDAZOLAM HCL 2 MG/2ML IJ SOLN: 2.0000 mg | INTRAMUSCULAR | Status: DC | PRN

## 2023-11-13 MED ORDER — SODIUM CHLORIDE 0.9% FLUSH
3.0000 mL | Freq: Two times a day (BID) | INTRAVENOUS | Status: DC
  Administered 2023-11-14 – 2023-11-17 (×8): 3 mL via INTRAVENOUS

## 2023-11-13 MED ORDER — BISACODYL 10 MG RE SUPP: 10.0000 mg | Freq: Every day | RECTAL | Status: DC

## 2023-11-13 MED ORDER — MIDAZOLAM HCL (PF) 10 MG/2ML IJ SOLN
INTRAMUSCULAR | Status: AC
  Filled 2023-11-13: qty 2

## 2023-11-13 MED ORDER — BISACODYL 5 MG PO TBEC
10.0000 mg | DELAYED_RELEASE_TABLET | Freq: Every day | ORAL | Status: DC
  Administered 2023-11-14 – 2023-11-17 (×4): 10 mg via ORAL
  Filled 2023-11-13 (×4): qty 2

## 2023-11-13 MED ORDER — CHLORHEXIDINE GLUCONATE 0.12 % MT SOLN
15.0000 mL | Freq: Once | OROMUCOSAL | Status: AC
  Administered 2023-11-13: 15 mL via OROMUCOSAL

## 2023-11-13 MED ORDER — PANTOPRAZOLE SODIUM 40 MG PO TBEC
40.0000 mg | DELAYED_RELEASE_TABLET | Freq: Every day | ORAL | Status: DC
  Administered 2023-11-15 – 2023-11-18 (×4): 40 mg via ORAL
  Filled 2023-11-13 (×4): qty 1

## 2023-11-13 MED ORDER — METOPROLOL TARTRATE 5 MG/5ML IV SOLN
2.5000 mg | INTRAVENOUS | Status: DC | PRN
  Administered 2023-11-14: 5 mg via INTRAVENOUS
  Filled 2023-11-13: qty 5

## 2023-11-13 MED ORDER — MORPHINE SULFATE (PF) 2 MG/ML IV SOLN
1.0000 mg | INTRAVENOUS | Status: DC | PRN
Start: 2023-11-13 — End: 2023-11-16
  Administered 2023-11-13 (×2): 4 mg via INTRAVENOUS
  Administered 2023-11-15: 2 mg via INTRAVENOUS
  Filled 2023-11-13: qty 2
  Filled 2023-11-13: qty 1
  Filled 2023-11-13: qty 2

## 2023-11-13 MED ORDER — FENTANYL CITRATE (PF) 250 MCG/5ML IJ SOLN
INTRAMUSCULAR | Status: AC
  Filled 2023-11-13: qty 5

## 2023-11-13 MED ORDER — ALBUMIN HUMAN 5 % IV SOLN
250.0000 mL | INTRAVENOUS | Status: DC | PRN
  Administered 2023-11-13: 12.5 g via INTRAVENOUS

## 2023-11-13 MED ORDER — CHLORHEXIDINE GLUCONATE 4 % EX SOLN: 30.0000 mL | CUTANEOUS | Status: DC

## 2023-11-13 MED ORDER — HEPARIN SODIUM (PORCINE) 1000 UNIT/ML IJ SOLN
INTRAMUSCULAR | Status: AC
  Filled 2023-11-13: qty 1

## 2023-11-13 MED ORDER — NICARDIPINE HCL IN NACL 20-0.86 MG/200ML-% IV SOLN
0.0000 mg/h | INTRAVENOUS | Status: DC
  Administered 2023-11-13: 5 mg/h via INTRAVENOUS
  Filled 2023-11-13: qty 200

## 2023-11-13 MED ORDER — SODIUM CHLORIDE 0.9 % IV SOLN: INTRAVENOUS | Status: DC

## 2023-11-13 MED ORDER — METOPROLOL TARTRATE 25 MG/10 ML ORAL SUSPENSION
12.5000 mg | Freq: Two times a day (BID) | ORAL | Status: DC
Start: 2023-11-13 — End: 2023-11-15

## 2023-11-13 MED ORDER — METHADONE HCL IV SYRINGE 10 MG/ML FOR CABG
20.0000 mg | Freq: Once | INTRAMUSCULAR | Status: AC
  Administered 2023-11-13: 20 mg via INTRAVENOUS
  Filled 2023-11-13: qty 2

## 2023-11-13 MED ORDER — SODIUM CHLORIDE 0.9% FLUSH
10.0000 mL | Freq: Two times a day (BID) | INTRAVENOUS | Status: DC
Start: 2023-11-13 — End: 2023-11-15
  Administered 2023-11-13 – 2023-11-14 (×2): 10 mL via INTRAVENOUS

## 2023-11-13 MED ORDER — GELATIN ABSORBABLE MT POWD
OROMUCOSAL | Status: DC | PRN
  Administered 2023-11-13: 2 via TOPICAL

## 2023-11-13 MED ORDER — ACETAMINOPHEN 500 MG PO TABS
1000.0000 mg | ORAL_TABLET | Freq: Four times a day (QID) | ORAL | Status: DC
  Administered 2023-11-14 – 2023-11-18 (×18): 1000 mg via ORAL
  Filled 2023-11-13 (×18): qty 2

## 2023-11-13 MED ORDER — ONDANSETRON HCL 4 MG/2ML IJ SOLN: 4.0000 mg | Freq: Four times a day (QID) | INTRAMUSCULAR | Status: DC | PRN

## 2023-11-13 MED ORDER — ACETAMINOPHEN 160 MG/5ML PO SOLN: 1000.0000 mg | Freq: Four times a day (QID) | ORAL | Status: DC

## 2023-11-13 MED ORDER — PROPOFOL 500 MG/50ML IV EMUL
INTRAVENOUS | Status: DC | PRN
  Administered 2023-11-13: 30 ug/kg/min via INTRAVENOUS

## 2023-11-13 MED ORDER — SODIUM CHLORIDE 0.9 % IV SOLN: INTRAVENOUS | Status: AC

## 2023-11-13 MED ORDER — CLEVIDIPINE BUTYRATE 0.5 MG/ML IV EMUL
INTRAVENOUS | Status: DC | PRN
  Administered 2023-11-13: 2 mg/h via INTRAVENOUS

## 2023-11-13 MED ORDER — ACETAMINOPHEN 160 MG/5ML PO SOLN
650.0000 mg | Freq: Once | ORAL | Status: DC
  Filled 2023-11-13: qty 20.3

## 2023-11-13 MED ORDER — DEXTROSE 50 % IV SOLN: 0.0000 mL | INTRAVENOUS | Status: DC | PRN

## 2023-11-13 MED ORDER — ORAL CARE MOUTH RINSE: 15.0000 mL | OROMUCOSAL | Status: DC | PRN

## 2023-11-13 MED ORDER — HEMOSTATIC AGENTS (NO CHARGE) OPTIME
TOPICAL | Status: DC | PRN
  Administered 2023-11-13: 1 via TOPICAL

## 2023-11-13 MED ORDER — PROTAMINE SULFATE 10 MG/ML IV SOLN
INTRAVENOUS | Status: DC | PRN
  Administered 2023-11-13: 20 mg via INTRAVENOUS
  Administered 2023-11-13: 230 mg via INTRAVENOUS

## 2023-11-13 MED ORDER — HEPARIN SODIUM (PORCINE) 1000 UNIT/ML IJ SOLN
INTRAMUSCULAR | Status: DC | PRN
  Administered 2023-11-13: 27000 [IU] via INTRAVENOUS

## 2023-11-13 MED ORDER — POTASSIUM CHLORIDE 10 MEQ/50ML IV SOLN: 10.0000 meq | INTRAVENOUS | Status: AC

## 2023-11-13 MED ORDER — SODIUM CHLORIDE 0.9% FLUSH
10.0000 mL | Freq: Two times a day (BID) | INTRAVENOUS | Status: DC
  Administered 2023-11-13 – 2023-11-17 (×8): 10 mL via INTRAVENOUS

## 2023-11-13 MED ORDER — NOREPINEPHRINE 4 MG/250ML-% IV SOLN: 0.0000 ug/min | INTRAVENOUS | Status: DC

## 2023-11-13 MED ORDER — LACTATED RINGERS IV SOLN
INTRAVENOUS | Status: AC
Start: 2023-11-13 — End: 2023-11-14

## 2023-11-13 SURGICAL SUPPLY — 78 items
BAG DECANTER FOR FLEXI CONT (MISCELLANEOUS) ×2 IMPLANT
BLADE STERNUM SYSTEM 6 (BLADE) ×2 IMPLANT
BNDG ELASTIC 4INX 5YD STR LF (GAUZE/BANDAGES/DRESSINGS) IMPLANT
BNDG ELASTIC 6INX 5YD STR LF (GAUZE/BANDAGES/DRESSINGS) IMPLANT
BNDG GAUZE DERMACEA FLUFF 4 (GAUZE/BANDAGES/DRESSINGS) ×2 IMPLANT
CABLE SURGICAL S-101-97-12 (CABLE) ×2 IMPLANT
CANISTER SUCT 3000ML PPV (MISCELLANEOUS) ×2 IMPLANT
CANNULA MC2 2 STG 29/37 NON-V (CANNULA) ×2 IMPLANT
CANNULA NON VENT 20FR 12 (CANNULA) ×2 IMPLANT
CATH ROBINSON RED A/P 18FR (CATHETERS) ×4 IMPLANT
CLIP RETRACTION 3.0MM CORONARY (MISCELLANEOUS) IMPLANT
CLIP TI MEDIUM 24 (CLIP) IMPLANT
CLIP TI WIDE RED SMALL 24 (CLIP) IMPLANT
CONN ST 1/2X1/2 BEN (MISCELLANEOUS) ×2 IMPLANT
CONNECTOR BLAKE 2:1 CARIO BLK (MISCELLANEOUS) ×2 IMPLANT
CONTAINER PROTECT SURGISLUSH (MISCELLANEOUS) ×4 IMPLANT
DERMABOND ADVANCED .7 DNX12 (GAUZE/BANDAGES/DRESSINGS) IMPLANT
DRAIN CHANNEL 19F RND (DRAIN) ×6 IMPLANT
DRAIN CONNECTOR BLAKE 1:1 (MISCELLANEOUS) ×2 IMPLANT
DRAPE INCISE IOBAN 66X45 STRL (DRAPES) IMPLANT
DRAPE SRG 135X102X78XABS (DRAPES) ×2 IMPLANT
DRAPE WARM FLUID 44X44 (DRAPES) ×2 IMPLANT
DRSG AQUACEL AG ADV 3.5X10 (GAUZE/BANDAGES/DRESSINGS) ×2 IMPLANT
ELECT BLADE 4.0 EZ CLEAN MEGAD (MISCELLANEOUS) ×2
ELECT REM PT RETURN 9FT ADLT (ELECTROSURGICAL) ×4
ELECTRODE BLDE 4.0 EZ CLN MEGD (MISCELLANEOUS) ×2 IMPLANT
ELECTRODE REM PT RTRN 9FT ADLT (ELECTROSURGICAL) ×4 IMPLANT
FELT TEFLON 1X6 (MISCELLANEOUS) ×4 IMPLANT
GAUZE 4X4 16PLY ~~LOC~~+RFID DBL (SPONGE) ×2 IMPLANT
GAUZE SPONGE 4X4 12PLY STRL (GAUZE/BANDAGES/DRESSINGS) ×4 IMPLANT
GLOVE BIO SURGEON STRL SZ 6.5 (GLOVE) IMPLANT
GLOVE BIO SURGEON STRL SZ7 (GLOVE) ×4 IMPLANT
GLOVE BIOGEL M STRL SZ7.5 (GLOVE) ×4 IMPLANT
GLOVE BIOGEL PI IND STRL 6 (GLOVE) IMPLANT
GLOVE INDICATOR 7.5 STRL GRN (GLOVE) IMPLANT
GOWN STRL REUS W/ TWL LRG LVL3 (GOWN DISPOSABLE) ×8 IMPLANT
GOWN STRL REUS W/ TWL XL LVL3 (GOWN DISPOSABLE) ×4 IMPLANT
HEMOSTAT POWDER SURGIFOAM 1G (HEMOSTASIS) ×4 IMPLANT
HEMOSTAT SURGICEL 2X14 (HEMOSTASIS) IMPLANT
INSERT FOGARTY XLG (MISCELLANEOUS) IMPLANT
INSERT SUTURE HOLDER (MISCELLANEOUS) ×2 IMPLANT
KIT BASIN OR (CUSTOM PROCEDURE TRAY) ×2 IMPLANT
KIT SUCTION CATH 14FR (SUCTIONS) ×2 IMPLANT
KIT TURNOVER KIT B (KITS) ×2 IMPLANT
KIT VASOVIEW HEMOPRO 2 VH 4000 (KITS) ×2 IMPLANT
LEAD PACING MYOCARDI (MISCELLANEOUS) ×2 IMPLANT
MARKER GRAFT CORONARY BYPASS (MISCELLANEOUS) ×6 IMPLANT
NS IRRIG 1000ML POUR BTL (IV SOLUTION) ×10 IMPLANT
PACK E OPEN HEART (SUTURE) ×2 IMPLANT
PACK OPEN HEART (CUSTOM PROCEDURE TRAY) ×2 IMPLANT
PAD ARMBOARD 7.5X6 YLW CONV (MISCELLANEOUS) ×4 IMPLANT
PAD ELECT DEFIB RADIOL ZOLL (MISCELLANEOUS) ×2 IMPLANT
PENCIL BUTTON HOLSTER BLD 10FT (ELECTRODE) ×2 IMPLANT
POSITIONER HEAD DONUT 9IN (MISCELLANEOUS) ×2 IMPLANT
PUNCH AORTIC ROTATE 4.0MM (MISCELLANEOUS) ×2 IMPLANT
SET MPS 3-ND DEL (MISCELLANEOUS) IMPLANT
SPONGE T-LAP 18X18 ~~LOC~~+RFID (SPONGE) ×8 IMPLANT
STOPCOCK 4 WAY LG BORE MALE ST (IV SETS) IMPLANT
SUPPORT HEART JANKE-BARRON (MISCELLANEOUS) ×2 IMPLANT
SUT BONE WAX W31G (SUTURE) ×2 IMPLANT
SUT ETHIBOND X763 2 0 SH 1 (SUTURE) ×4 IMPLANT
SUT MNCRL AB 3-0 PS2 18 (SUTURE) ×4 IMPLANT
SUT MNCRL AB 4-0 PS2 18 (SUTURE) IMPLANT
SUT PDS AB 1 CTX 36 (SUTURE) ×4 IMPLANT
SUT PROLENE 4 0 SH DA (SUTURE) ×2 IMPLANT
SUT PROLENE 4-0 RB1 .5 CRCL 36 (SUTURE) IMPLANT
SUT PROLENE 5 0 C 1 36 (SUTURE) ×6 IMPLANT
SUT PROLENE 7 0 BV 1 (SUTURE) IMPLANT
SUT PROLENE 7 0 BV1 MDA (SUTURE) ×2 IMPLANT
SUT VIC AB 2-0 CT1 TAPERPNT 27 (SUTURE) IMPLANT
SYSTEM SAHARA CHEST DRAIN ATS (WOUND CARE) ×2 IMPLANT
TAPE CLOTH SURG 4X10 WHT LF (GAUZE/BANDAGES/DRESSINGS) IMPLANT
TOWEL GREEN STERILE (TOWEL DISPOSABLE) ×2 IMPLANT
TOWEL GREEN STERILE FF (TOWEL DISPOSABLE) ×2 IMPLANT
TRAY FOLEY SLVR 16FR TEMP STAT (SET/KITS/TRAYS/PACK) ×2 IMPLANT
TUBING LAP HI FLOW INSUFFLATIO (TUBING) ×2 IMPLANT
UNDERPAD 30X36 HEAVY ABSORB (UNDERPADS AND DIAPERS) ×2 IMPLANT
WATER STERILE IRR 1000ML POUR (IV SOLUTION) ×4 IMPLANT

## 2023-11-13 NOTE — Consult Note (Signed)
NAME:  Donald Palmer, MRN:  [id number], DOB:  July 15, 1961, LOS: 0 ADMISSION DATE:  11/13/2023, CONSULTATION DATE:  11/13/2023 REFERRING MD:  Nat Christen, CHIEF COMPLAINT:  s/p CABG   History of Present Illness:  62 year old male with past medical history of diabetes, chronic hepatitis C, hypertension, PAD, CAD who presented initially to [facility] for surgical evaluation of three-vessel CAD. Reportedly, found incidentally by Dr. Randie Heinz for LLE bypass surgery. No chest pain, shortness of breath. He underwent left heart cath on 10/16/2023 showing distal LM 40%, LAD mid=80%, Lcx 80%, RCA 75%, distal RCA occlusion, LVEF 45-50%. He had echo completed 10/22/23 demonstrating EF 55-60%, no RWMA, G1DD. From OR notes, appears to have had scheduled CABG 11/25 which was rescheduled to 11/27 for no show.   S/p CABG LIMA LAD, RSVG PDA, OM  Cross clamp: 45 Pump time: 80 EBL: 500  Pertinent  Medical History  diabetes, chronic hepatitis C, hypertension, PAD, CAD  Significant Hospital Events: Including procedures, antibiotic start and stop dates in addition to other pertinent events   11/27 CABG x 3  Interim History / Subjective:  Consult  Objective   Blood pressure (!) 144/81, pulse 66, temperature 97.9 F (36.6 C), temperature source Oral, resp. rate 17, height 5\' 9"  (1.753 m), weight 85.3 kg, SpO2 99%.        Intake/Output Summary (Last 24 hours) at 11/13/2023 0947 Last data filed at 11/13/2023 0836 Gross per 24 hour  Intake 100 ml  Output --  Net 100 ml   Filed Weights   11/13/23 0546  Weight: 85.3 kg    Examination: General: no distress HENT: pupils small equal, reactive Lungs: clear, passive on vent Cardiovascular: regular, sternotomy dressed, minima boody output Abdomen: soft, hypoactive BS Extremities: RLE wrapped, chronic ischemic changes LLE, +dopplerable pulses Neuro: sedated/paralyzed GU: foley straw urine  Resolved Hospital Problem list   N/A  Assessment & Plan:  CAD s/p  CABG x3 - post op management per [facility]  - chest tube, wires per [facility] - PT/OT/IS  - GDMT when clinically appropriate    Postoperative ventilator management  - rapid wean pathway - lung protective ventilation 6-8cc/kg Vt - VAP and PAD bundle in place  - titrate FiO2 to sat goal >92 - maintain peak/plats <30, driving pressures <13    Hypertension  Hyperlipidemia  - con't atorvastatin 80mg  daily  - hold antihypertensives for now - home regimen: losartan   Diabetes; A1c 8.7  - Insulin gtt with eventual transition to basal bolus   PAD; LLE stenosis of left common femoral artery and proximal SFA with occlusion of distal SFA  - per Dr. Randie Heinz note from 09/09/23 to have left common femoral artery endarterectomy with bypass to popliteal after CAD revascularization  - mgmt per vascular in OP setting  - on plavix, eliquis; hold postop until okay to restart per [facility]   Chronic hepatitis C  - intermittently monitor LFT  Best Practice (right click and "Reselect all SmartList Selections" daily)   Diet/type: NPO DVT prophylaxis: not indicated Pressure ulcer(s). none GI prophylaxis: PPI Lines: Central line Foley:  Yes, and it is still needed Code Status:  full code Last date of multidisciplinary goals of care discussion [discussed with [facility]]  Labs   CBC: Recent Labs  Lab 11/11/23 1002 11/11/23 1028 11/13/23 0831 11/13/23 0928  WBC 4.8 PATIENT IDENTIFICATION ERROR. PLEASE DISREGARD RESULTS. ACCOUNT WILL BE CREDITED.  --   --   HGB 13.2 PATIENT IDENTIFICATION ERROR. PLEASE DISREGARD RESULTS. ACCOUNT WILL BE  CREDITED. 13.3 12.9*  HCT 43.7 PATIENT IDENTIFICATION ERROR. PLEASE DISREGARD RESULTS. ACCOUNT WILL BE CREDITED. 39.0 38.0*  MCV 72.7* PATIENT IDENTIFICATION ERROR. PLEASE DISREGARD RESULTS. ACCOUNT WILL BE CREDITED.  --   --   PLT 282 PATIENT IDENTIFICATION ERROR. PLEASE DISREGARD RESULTS. ACCOUNT WILL BE CREDITED.  --   --     Basic Metabolic Panel: Recent Labs  Lab  11/11/23 1028 11/13/23 0831 11/13/23 0928  NA 135 140 139  K 4.2 4.0 3.9  CL 101 104 105  CO2 26  --   --   GLUCOSE 178* 106* 104*  BUN 7* 7* 7*  CREATININE 1.11 0.90 0.90  CALCIUM 9.5  --   --    GFR: Estimated Creatinine Clearance: 92.1 mL/min (by C-G formula based on SCr of 0.9 mg/dL). Recent Labs  Lab 11/11/23 1002 11/11/23 1028  WBC 4.8 PATIENT IDENTIFICATION ERROR. PLEASE DISREGARD RESULTS. ACCOUNT WILL BE CREDITED.    Liver Function Tests: Recent Labs  Lab 11/11/23 1028  AST 21  ALT 21  ALKPHOS 75  BILITOT 0.3  PROT 7.3  ALBUMIN 3.8   No results for input(s): "LIPASE", "AMYLASE" in the last 168 hours. No results for input(s): "AMMONIA" in the last 168 hours.  ABG    Component Value Date/Time   TCO2 24 11/13/2023 0928     Coagulation Profile: Recent Labs  Lab 11/11/23 1028  INR 1.0    Cardiac Enzymes: No results for input(s): "CKTOTAL", "CKMB", "CKMBINDEX", "TROPONINI" in the last 168 hours.  HbA1C: Hemoglobin A1C  Date/Time Value Ref Range Status  06/11/2022 09:44 AM 8.0 (A) 4.0 - 5.6 % Final  02/26/2022 08:52 AM 7.9 (A) 4.0 - 5.6 % Final   Hgb A1c MFr Bld  Date/Time Value Ref Range Status  11/11/2023 10:25 AM 8.7 (H) 4.8 - 5.6 % Final    Comment:    (NOTE) Pre diabetes:          5.7%-6.4%  Diabetes:              >6.4%  Glycemic control for   <7.0% adults with diabetes   05/13/2016 03:52 PM 7.7 (H) 4.8 - 5.6 % Final    Comment:    (NOTE)         Pre-diabetes: 5.7 - 6.4         Diabetes: >6.4         Glycemic control for adults with diabetes: <7.0     CBG: Recent Labs  Lab 11/11/23 0939 11/13/23 0554  GLUCAP 252* 103*    Review of Systems:   Intubated/sedated  Past Medical History:  He,  has a past medical history of Back pain, chronic, Coronary artery disease, Diabetes mellitus (2007), Hepatitis C, History of chronic hepatitis C (05/13/2012), Hypertension goal BP (blood pressure) < 140/80, Microcytic anemia,  Neuromuscular disorder (HCC), PAD (peripheral artery disease) (HCC), and Pancreatitis (01/09/2012).   Surgical History:   Past Surgical History:  Procedure Laterality Date   ABDOMINAL AORTOGRAM N/A 03/03/2018   Procedure: ABDOMINAL AORTOGRAM;  Surgeon: Runell Gess, MD;  Location: [facility];  Service: Cardiovascular;  Laterality: N/A;   ABDOMINAL AORTOGRAM W/LOWER EXTREMITY N/A 06/15/2019   Procedure: ABDOMINAL AORTOGRAM W/LOWER EXTREMITY;  Surgeon: Runell Gess, MD;  Location: [facility];  Service: Cardiovascular;  Laterality: N/A;   ABDOMINAL AORTOGRAM W/LOWER EXTREMITY N/A 09/09/2023   Procedure: ABDOMINAL AORTOGRAM W/LOWER EXTREMITY;  Surgeon: Maeola Harman, MD;  Location[facility];  Service: Cardiovascular;  Laterality: N/A;   I & D EXTREMITY Right 05/15/2016   Procedure: RIGHT INDEX FINGER FIRST THROUGH  MIDDLE PHALYNX  AMPUTATION;  Surgeon: Mack Hook, MD;  Location: [facility] OR;  Service: Orthopedics;  Laterality: Right;   LEFT HEART CATH AND CORONARY ANGIOGRAPHY N/A 10/16/2023   Procedure: LEFT HEART CATH AND CORONARY ANGIOGRAPHY;  Surgeon: Elder Negus, MD;  Location: [facility];  Service: Cardiovascular;  Laterality: N/A;   LOWER EXTREMITY INTERVENTION Bilateral 03/03/2018   Procedure: LOWER EXTREMITY INTERVENTION;  Surgeon: Runell Gess, MD;  Location: [facility];  Service: Cardiovascular;  Laterality: Bilateral;   OPEN REDUCTION INTERNAL FIXATION (ORIF) DISTAL PHALANX Right 04/02/2016   Procedure: RIGHT INDEX FINGER REPAIR;  Surgeon: Mack Hook, MD;  Location: [facility];  Service: Orthopedics;  Laterality: Right;   PERIPHERAL VASCULAR ATHERECTOMY Left 03/03/2018   Procedure: PERIPHERAL VASCULAR ATHERECTOMY;  Surgeon: Runell Gess, MD;  Location: [facility] INVASIVE CV LAB;  Service: Cardiovascular;  Laterality: Left;  SFA WITH PTA DRUG COATED BALLOON   PERIPHERAL VASCULAR INTERVENTION Left  06/15/2019   Procedure: PERIPHERAL VASCULAR INTERVENTION;  Surgeon: Runell Gess, MD;  Location: [facility];  Service: Cardiovascular;  Laterality: Left;     Social History:   reports that he has been smoking cigarettes and cigars. He has a 3 pack-year smoking history. He has never used smokeless tobacco. He reports that he does not currently use alcohol after a past usage of about 5.0 standard drinks of alcohol per week. He reports that he does not currently use drugs after having used the following drugs: Marijuana. Frequency: 1.00 time per week.   Family History:  His family history includes Asthma in his sister; Diabetes in his brother, brother, daughter, and mother; Hyperlipidemia in his mother; Hypertension in his mother; Obesity in his sister. There is no history of Heart attack or Sudden death.   Allergies Allergies  Allergen Reactions   Metformin And Related Other (See Comments)    GI side effects. Stopped 2016     Home Medications  Prior to Admission medications   Medication Sig Start Date End Date Taking? Authorizing Provider  apixaban (ELIQUIS) 5 MG TABS tablet Take 1 tablet (5 mg total) by mouth 2 (two) times daily. Patient taking differently: Take 5 mg by mouth daily. 07/27/22  Yes Tyson Alias, MD  atorvastatin (LIPITOR) 80 MG tablet Take 1 tablet (80 mg total) by mouth daily at 6 PM. 06/11/22  Yes Tyson Alias, MD  colchicine 0.6 MG tablet Take 0.6 mg by mouth daily as needed (gout flare).   Yes [provider]  cyclobenzaprine (FLEXERIL) 5 MG tablet Take 1 tablet (5 mg total) by mouth daily as needed for muscle spasms. Patient taking differently: Take 5 mg by mouth daily. 07/27/22  Yes Tyson Alias, MD  famotidine (PEPCID) 20 MG tablet Take 20 mg by mouth daily. 09/29/23  Yes [provider]  insulin degludec (TRESIBA) 100 UNIT/ML FlexTouch Pen Inject 20 Units into the skin daily. Patient taking differently: Inject  40 Units into the skin as needed (high blood sugar). 08/16/22  Yes Tyson Alias, MD  ketorolac (TORADOL) 10 MG tablet Take 10 mg by mouth daily. 10/01/23  Yes [provider]  omeprazole (PRILOSEC) 40 MG capsule Take 1 capsule (40 mg total) by mouth daily. 10/23/21  Yes Tyson Alias, MD  pioglitazone (ACTOS) 30 MG tablet Take 30 mg by mouth daily. 09/14/23  Yes [provider]  Specialty Vitamins Products (NERVIVE NERVE RELIEF PO) Take 1 tablet by mouth daily.   Yes [provider]  Accu-Chek Softclix Lancets lancets check blood sugar 2 times a day 06/05/21   Ephriam Knuckles, Rylee, MD  blood glucose meter kit and supplies KIT Use to check glucose twice a day 10/08/19   Burns Spain, MD  Blood Glucose Monitoring Suppl (ACCU-CHEK GUIDE) w/Device KIT Check blood sugar 2 times a day 06/05/21   Elige Radon, MD  clopidogrel (PLAVIX) 75 MG tablet Take 1 tablet (75 mg total) by mouth daily. 02/26/22   Tyson Alias, MD  cyclobenzaprine (FLEXERIL) 10 MG tablet Take 10 mg by mouth daily. Patient not taking: Reported on 11/11/2023 10/01/23   [provider]  fluticasone (FLONASE) 50 MCG/ACT nasal spray USE 2 SPRAYS IN EACH NOSTRIL DAILY Patient taking differently: Place 3-4 sprays into both nostrils daily. 06/05/21   Elige Radon, MD  glucose blood (ACCU-CHEK GUIDE) test strip USE TO CHECK GLUCOSE TWICE DAILY 08/29/22   Tyson Alias, MD  ibuprofen (ADVIL) 800 MG tablet Take 800 mg by mouth 3 (three) times daily as needed. Patient not taking: Reported on 11/11/2023 09/29/23   [provider]  Insulin Pen Needle (PEN NEEDLES) 32G X 4 MM MISC Use to inject liraglutide once a day 06/05/21   Elige Radon, MD  Lancet Devices MISC 1 each by Does not apply route 2 (two) times daily. Please use to check blood sugar 2 times daily. diag code E11.40. noninsulin dependent 06/05/21   Elige Radon, MD  losartan (COZAAR) 50 MG  tablet Take 1 tablet (50 mg total) by mouth daily. 02/26/22   Tyson Alias, MD     Critical care time: 31 mins

## 2023-11-13 NOTE — Progress Notes (Signed)
[name], male   DOB: 1961/08/04, 62 y.o.   MRN: [id number]  [facility] Evening Rounds:   Hemodynamically stable  CO 6  Extubated in 2 hrs.  Urine output good  CT output low  CBC    Component Value Date/Time   WBC 11.5 (H) 11/13/2023 1825   RBC 5.15 11/13/2023 1825   HGB 11.5 (L) 11/13/2023 1825   HGB 12.5 (L) 10/11/2023 1307   HCT 37.2 (L) 11/13/2023 1825   HCT 40.9 10/11/2023 1307   PLT 219 11/13/2023 1825   PLT 278 10/11/2023 1307   MCV 72.2 (L) 11/13/2023 1825   MCV 74 (L) 10/11/2023 1307   MCH 22.3 (L) 11/13/2023 1825   MCHC 30.9 11/13/2023 1825   RDW 15.9 (H) 11/13/2023 1825   RDW 15.6 (H) 10/11/2023 1307   LYMPHSABS 2.7 10/11/2023 1307   MONOABS 0.5 05/31/2022 1215   EOSABS 0.2 10/11/2023 1307   BASOSABS 0.1 10/11/2023 1307     BMET    Component Value Date/Time   NA 141 11/13/2023 1225   NA 138 10/11/2023 1308   K 4.0 11/13/2023 1225   CL 106 11/13/2023 1129   CO2 26 11/11/2023 1028   GLUCOSE 88 11/13/2023 1129   BUN 7 (L) 11/13/2023 1129   BUN 15 10/11/2023 1308   CREATININE 0.80 11/13/2023 1129   CREATININE 0.77 10/28/2014 1112   CALCIUM 9.5 11/11/2023 1028   EGFR 78 10/11/2023 1308   GFRNONAA >60 11/11/2023 1028   GFRNONAA >89 10/28/2014 1112     A/P:  Stable postop course. Continue current plans

## 2023-11-13 NOTE — Op Note (Signed)
[address]       [place] [address]             [phone]                                          11/13/2023 Patient:  Donald Palmer Pre-Op Dx: 3V CAD PVD Claudication HTN HLP DM   Post-op Dx:  same Procedure: CABG X 3.  LIMA LAD, RSVG PDA, OM   Endoscopic greater saphenous vein harvest on the right   Surgeon and Role:      * Ladeana Laplant, Eliezer Lofts, MD - Primary    * Gaynelle Arabian , PA-C - assisting An experienced assistant was required given the complexity of this surgery and the standard of surgical care. The assistant was needed for exposure, dissection, suctioning, retraction of delicate tissues and sutures, instrument exchange and for overall help during this procedure.    Anesthesia  general EBL:  Blood Administration: none Xclamp Time:  45 min Pump Time:   Drains: 40 F blake drain: L, mediastinal  Wires: Ventricular Counts: correct   Indications: 62-year-old male with three-vessel coronary disease. He also has claudication and his left lower extremity, and is scheduled for left femoral endarterectomy and femoral to popliteal bypass., Review of his left heart catheterization and he has good targets in his LAD, and PDA. His left heart catheterization reveals normal function without any significant valvular disease. We discussed risks and benefits of CABG, and I also discussed the possibility of decreased blood flow to his leg in the perioperative period given his severe peripheral vascular disease. He is agreeable to proceed.   Findings: Good LIMA and vein.  Good distal targets  Operative Technique: All invasive lines were placed in pre-op holding.  After the risks, benefits and alternatives were thoroughly discussed, the patient was brought to the operative theatre.  Anesthesia was induced, and the patient was prepped and draped in normal sterile fashion.  An appropriate surgical pause was performed, and pre-operative antibiotics  were dosed accordingly.  We began with simultaneous incisions along the right leg for harvesting of the greater saphenous vein and the chest for the sternotomy.  In regards to the sternotomy, this was carried down with bovie cautery, and the sternum was divided with a reciprocating saw.  Meticulous hemostasis was obtained.  The left internal thoracic artery was exposed and harvested in in pedicled fashion.  The patient was systemically heparinized, and the artery was divided distally, and placed in a papaverine sponge.    The sternal elevator was removed, and a retractor was placed.  The pericardium was divided in the midline and fashioned into a cradle with pericardial stitches.   After we confirmed an appropriate ACT, the ascending aorta was cannulated in standard fashion.  The right atrial appendage was used for venous cannulation site.  Cardiopulmonary bypass was initiated, and the heart retractor was placed. The cross clamp was applied, and a dose of anterograde cardioplegia was given with good arrest of the heart.  We moved to the posterior wall of the heart, and found a good target on the PDA.  An arteriotomy was made, and the vein graft was anastomosed to it in an end to side fashion.  Next we exposed the lateral wall, and found a good target on the OM.  An end to side  anastomosis with the vein graft was then created.  Finally, we exposed a good target on the  LAD, and fashioned an end to side anastomosis between it and the LITA.  We began to re-warm, and a re-animation dose of cardioplegia was given.  The heart was de-aired, and the cross clamp was removed.  Meticulous hemostasis was obtained.    A partial occludding clamp was then placed on the ascending aorta, and we created an end to side anastomosis between it and the proximal vein grafts.  Rings were placed on the proximal anastomosis.  Hemostasis was obtained, and we separated from cardiopulmonary bypass without event.  The heparin was reversed  with protamine.  Chest tubes and wires were placed, and the sternum was re-approximated with sternal wires.  The soft tissue and skin were re-approximated wth absorbable suture.    The patient tolerated the procedure without any immediate complications, and was transferred to the ICU in guarded condition.  Donald Palmer Donald Palmer

## 2023-11-13 NOTE — TOC Initial Note (Signed)
Transition of Care [facility]) - Initial/Assessment Note    Patient Details  Name: Donald Palmer MRN: [id number] Date of Birth: 02-15-1961  [facility] CM/SW Contact:    Elliot Cousin, RN Phone Number: [phone] 11/13/2023, 2:52 PM  Clinical Narrative:                 CABG x4 11/13/2023 Attempted call to pt's wife or dtr. Did not receive an answer. Will continue to follow for dc needs.   Expected Discharge Plan: IP Rehab Facility Barriers to Discharge: Continued Medical Work up   Patient Goals and CMS Choice            Expected Discharge Plan and Services   Discharge Planning Services: CM Consult                                          Prior Living Arrangements/Services                       Activities of Daily Living      Permission Sought/Granted                  Emotional Assessment   Attitude/Demeanor/Rapport: Intubated (Following Commands or Not Following Commands)          Admission diagnosis:  S/P CABG x 3 [Z95.1] Patient Active Problem List   Diagnosis Date Noted   S/P CABG x 3 11/13/2023   Acute superficial venous thrombosis of right lower extremity 06/11/2022   Osteoarthritis of lower back 10/16/2021   Hypertension 10/16/2021   Tendinitis of both rotator cuffs 09/26/2020   Gout 07/29/2018   PAD (peripheral artery disease) (HCC) 01/29/2018   Erectile dysfunction associated with type 2 diabetes mellitus (HCC) 03/01/2016   Atherosclerosis of aorta (HCC) 03/30/2015   Hyperlipidemia associated with type 2 diabetes mellitus (HCC) 12/22/2014   Preop cardiovascular exam 12/08/2012   GERD (gastroesophageal reflux disease) 07/08/2012   Diabetic polyradiculopathy associated with type 2 diabetes mellitus (HCC) 04/30/2012   Type 2 diabetes mellitus with diabetic neuropathy (HCC) 12/17/2001   PCP:  Patient, No Pcp Per Pharmacy:   [facility] [phone] - [place], [place] - [address] AT [facility] [address] [place] [address] Phone: [phone] Fax: [phone]  [id number] [place], [place] - [address] AT [facility] [address] [place] [address] Phone: [phone] Fax: [phone]  [facility] - [place], [place] - [address] [place] [address] Phone: [phone] Fax: [phone]     Social Determinants of Health (SDOH) Social History: SDOH Screenings   Alcohol Screen: Low Risk  (02/05/2019)  Depression (PHQ2-9): Low Risk  (06/11/2022)  Tobacco Use: High Risk (11/13/2023)   SDOH Interventions:     Readmission Risk Interventions     No data to display

## 2023-11-13 NOTE — Anesthesia Procedure Notes (Signed)
Procedure Name: Intubation Date/Time: 11/13/2023 8:06 AM  Performed by: Randon Goldsmith, CRNAPre-anesthesia Checklist: Patient identified, Emergency Drugs available, Suction available and Patient being monitored Patient Re-evaluated:Patient Re-evaluated prior to induction Oxygen Delivery Method: Circle system utilized Preoxygenation: Pre-oxygenation with 100% oxygen Induction Type: IV induction Ventilation: Mask ventilation without difficulty and Oral airway inserted - appropriate to patient size Laryngoscope Size: Mac and 4 Grade View: Grade I Tube type: Oral Tube size: 8.0 mm Number of attempts: 1 Airway Equipment and Method: Stylet and Oral airway Placement Confirmation: ETT inserted through vocal cords under direct vision, positive ETCO2 and breath sounds checked- equal and bilateral Secured at: 24 cm Tube secured with: Tape Dental Injury: Teeth and Oropharynx as per pre-operative assessment

## 2023-11-13 NOTE — Procedures (Signed)
Extubation Procedure Note  Patient Details:   Name: Donald Palmer DOB: [birth year] MRN: [id number]   Airway Documentation:    Vent end date: 11/13/23 Vent end time: 1435   Evaluation  O2 sats: stable throughout Complications: No apparent complications Patient did tolerate procedure well. Bilateral Breath Sounds: Clear, Diminished   Yes  Patient extubated per MD order.  Positive cuff leak noted.  No evidence of stridor.  Patient able to speak post extubation.  Sats and vitals are currently stable.    Elyn Peers 11/13/2023, 2:40 PM

## 2023-11-13 NOTE — Anesthesia Procedure Notes (Signed)
Central Venous Catheter Insertion Performed by: Stanton Nation, MD, anesthesiologist Start/End11/27/2024 6:50 AM, 11/13/2023 7:00 AM Patient location: OOR procedure area. Preanesthetic checklist: patient identified, IV checked, site marked, risks and benefits discussed, surgical consent, monitors and equipment checked, pre-op evaluation, timeout performed and anesthesia consent Lidocaine 1% used for infiltration and patient sedated Hand hygiene performed  and maximum sterile barriers used  Catheter size: 8 Fr Total catheter length 16. Central line was placed.Double lumen Procedure performed using ultrasound guided technique. Ultrasound Notes:image(s) printed for medical record Following insertion, dressing applied and line sutured. Post procedure assessment: blood return through all ports  Patient tolerated the procedure well with no immediate complications.

## 2023-11-13 NOTE — Brief Op Note (Signed)
11/13/2023  10:48 AM  PATIENT:  Liliana Cline  62 y.o. male  PRE-OPERATIVE DIAGNOSIS:  CORONARY ARTERY DISEASE  POST-OPERATIVE DIAGNOSIS:  CORONARY ARTERY DISEASE  PROCEDURES:   CORONARY ARTERY BYPASS GRAFTING X 3, USING LEFT INTERNAL MAMMARY ARTERY AND ENDOSCOPICALLY HARVESTED RIGHT SAPHENOUS VEIN GRAFT   Vein harvest time: Vein prep time:  TRANSESOPHAGEAL ECHOCARDIOGRAM   SURGEON:  Corliss Skains, MD   PHYSICIAN ASSISTANT: Ajanee Buren  ASSISTANTS: Tanda Rockers, RN, RN First Assistant    ANESTHESIA:   general  EBL:  BLOOD ADMINISTERED:none  DRAINS:  mediastinal and left pleural drains    LOCAL MEDICATIONS USED:  NONE  SPECIMEN:  No Specimen  DISPOSITION OF SPECIMEN:  N/A  COUNTS:  correct  DICTATION: .Dragon Dictation  PLAN OF CARE: Admit to inpatient   PATIENT DISPOSITION:  ICU - intubated and hemodynamically stable.   Delay start of Pharmacological VTE agent (>24hrs) due to surgical blood loss or risk of bleeding: yes

## 2023-11-13 NOTE — Anesthesia Postprocedure Evaluation (Signed)
Anesthesia Post Note  Patient: Donald Palmer  Procedure(s) Performed: CORONARY ARTERY BYPASS GRAFTING X 3, USING LEFT INTERNAL MAMMARY ARTERY AND ENDOSCOPICALLY HARVESTED RIGHT SAPHENOUS VEIN GRAFT (Chest) TRANSESOPHAGEAL ECHOCARDIOGRAM     Patient location during evaluation: SICU Anesthesia Type: General Level of consciousness: sedated Pain management: pain level controlled Vital Signs Assessment: post-procedure vital signs reviewed and stable Respiratory status: patient remains intubated per anesthesia plan Cardiovascular status: stable Postop Assessment: no apparent nausea or vomiting Anesthetic complications: no   No notable events documented.  Last Vitals:  Vitals:   11/13/23 0546 11/13/23 1217  BP: (!) 144/81 115/64  Pulse: 66   Resp: 17   Temp: 36.6 C   SpO2: 99%     Last Pain:  Vitals:   11/13/23 0611  TempSrc:   PainSc: 9                  [place]

## 2023-11-13 NOTE — Transfer of Care (Signed)
Immediate Anesthesia Transfer of Care Note  Patient: Donald Palmer  Procedure(s) Performed: CORONARY ARTERY BYPASS GRAFTING X 3, USING LEFT INTERNAL MAMMARY ARTERY AND ENDOSCOPICALLY HARVESTED RIGHT SAPHENOUS VEIN GRAFT (Chest) TRANSESOPHAGEAL ECHOCARDIOGRAM  Patient Location: ICU  Anesthesia Type:General  Level of Consciousness: sedated and Patient remains intubated per anesthesia plan  Airway & Oxygen Therapy: Patient remains intubated per anesthesia plan and Patient placed on Ventilator (see vital sign flow sheet for setting)  Post-op Assessment: Report given to RN and Post -op Vital signs reviewed and stable  Post vital signs: Reviewed and stable  Last Vitals:  Vitals Value Taken Time  BP 115/64 11/13/23 1217  Temp    Pulse 70 11/13/23 1221  Resp 14 11/13/23 1221  SpO2 100 % 11/13/23 1221  Vitals shown include unfiled device data.  Last Pain:  Vitals:   11/13/23 0611  TempSrc:   PainSc: 9          Complications: No notable events documented.

## 2023-11-13 NOTE — Interval H&P Note (Signed)
History and Physical Interval Note:  11/13/2023 7:33 AM  Donald Palmer  has presented today for surgery, with the diagnosis of CAD.  The various methods of treatment have been discussed with the patient and family. After consideration of risks, benefits and other options for treatment, the patient has consented to  Procedure(s): CORONARY ARTERY BYPASS GRAFTING (CABG) (N/A) TRANSESOPHAGEAL ECHOCARDIOGRAM (N/A) as a surgical intervention.  The patient's history has been reviewed, patient examined, no change in status, stable for surgery.  I have reviewed the patient's chart and labs.  Questions were answered to the patient's satisfaction.     Arik Husmann Keane Scrape

## 2023-11-13 NOTE — Anesthesia Procedure Notes (Signed)
Arterial Line Insertion Start/End11/27/2024 7:00 AM, 11/13/2023 7:02 AM Performed by: Randon Goldsmith, CRNA, CRNA  Patient location: Pre-op. Preanesthetic checklist: patient identified, IV checked, site marked, risks and benefits discussed, surgical consent, monitors and equipment checked, pre-op evaluation, timeout performed and anesthesia consent Lidocaine 1% used for infiltration Left, radial was placed Catheter size: 20 G Hand hygiene performed , maximum sterile barriers used  and Seldinger technique used Allen's test indicative of satisfactory collateral circulation Attempts: 1 Procedure performed without using ultrasound guided technique. Following insertion, dressing applied. Post procedure assessment: normal and unchanged  Patient tolerated the procedure well with no immediate complications.

## 2023-11-13 NOTE — Plan of Care (Signed)
  Problem: Education: Goal: Knowledge of General Education information will improve Description: Including pain rating scale, medication(s)/side effects and non-pharmacologic comfort measures Outcome: Progressing   Problem: Health Behavior/Discharge Planning: Goal: Ability to manage health-related needs will improve Outcome: Progressing   Problem: Clinical Measurements: Goal: Ability to maintain clinical measurements within normal limits will improve Outcome: Progressing Goal: Will remain free from infection Outcome: Progressing Goal: Diagnostic test results will improve Outcome: Progressing Goal: Respiratory complications will improve Outcome: Progressing Goal: Cardiovascular complication will be avoided Outcome: Progressing   Problem: Activity: Goal: Risk for activity intolerance will decrease Outcome: Progressing   Problem: Nutrition: Goal: Adequate nutrition will be maintained Outcome: Progressing   Problem: Coping: Goal: Level of anxiety will decrease Outcome: Progressing   Problem: Elimination: Goal: Will not experience complications related to bowel motility Outcome: Progressing Goal: Will not experience complications related to urinary retention Outcome: Progressing   Problem: Pain Management: Goal: General experience of comfort will improve Outcome: Progressing   Problem: Safety: Goal: Ability to remain free from injury will improve Outcome: Progressing   Problem: Skin Integrity: Goal: Risk for impaired skin integrity will decrease Outcome: Progressing   Problem: Education: Goal: Will demonstrate proper wound care and an understanding of methods to prevent future damage Outcome: Progressing Goal: Knowledge of disease or condition will improve Outcome: Progressing Goal: Knowledge of the prescribed therapeutic regimen will improve Outcome: Progressing Goal: Individualized Educational Video(s) Outcome: Progressing   Problem: Activity: Goal: Risk for  activity intolerance will decrease Outcome: Progressing   Problem: Cardiac: Goal: Will achieve and/or maintain hemodynamic stability Outcome: Progressing   Problem: Clinical Measurements: Goal: Postoperative complications will be avoided or minimized Outcome: Progressing   Problem: Respiratory: Goal: Respiratory status will improve Outcome: Progressing   Problem: Skin Integrity: Goal: Wound healing without signs and symptoms of infection Outcome: Progressing Goal: Risk for impaired skin integrity will decrease Outcome: Progressing   Problem: Urinary Elimination: Goal: Ability to achieve and maintain adequate renal perfusion and functioning will improve Outcome: Progressing

## 2023-11-13 NOTE — Hospital Course (Addendum)
Referring: Elder Negus, MD Primary Care: none  *Follow up appts requested       History of Present Illness:     Donald Palmer 62 y.o. male presents for surgical evaluation of three-vessel coronary artery disease.  This was found incidentally while he was being evaluated for left lower extremity bypass surgery with Dr. Randie Heinz.  He denies any chest pain or exertional dyspnea.  He is very limited in his mobility due to his left lower extremity medication. Donald Palmer has a history of diabetes mellitus,  GERD, dyslipidemia, hypertension, and chronic hepatitis C.  62 year old male with three-vessel coronary disease. He also has claudication and his left lower extremity, and is scheduled for left femoral endarterectomy and femoral to popliteal bypass., Review of his left heart catheterization and he has good targets in his LAD, and PDA. His left heart catheterization reveals normal function without any significant valvular disease. We discussed risks and benefits of CABG, and I also discussed the possibility of decreased blood flow to his leg in the perioperative period given his severe peripheral vascular disease. He is agreeable to proceed.   Hospital Course: Donald Palmer was admitted for elective surgery on 11/13/23 and taken to the OR where CABG x 3 was performed by Dr. Cliffton Asters without complication. Following the procedure he separated from cardiopulmonary bypass without difficulty and he was transferred to the surgical ICU in stable condition. He was extubated the evening of surgery without complication. Drips were weaned as hemodynamics tolerated on POD1, arterial line was removed without complication. Low dose beta blocker was started. Plavix was restarted for his peripheral vascular disease. Epicardial pacing wires and chest tubes were removed without complication. He was felt stable for transfer to the progressive unit. He was saturating well on 2L [place], flutter valve was added. Oxygen was weaned as  tolerated. Home Eliquis was restarted for PAD.

## 2023-11-14 ENCOUNTER — Inpatient Hospital Stay (HOSPITAL_COMMUNITY): Payer: Medicare HMO

## 2023-11-14 ENCOUNTER — Encounter (HOSPITAL_COMMUNITY): Payer: Self-pay | Admitting: Thoracic Surgery (Cardiothoracic Vascular Surgery)

## 2023-11-14 DIAGNOSIS — I998 Other disorder of circulatory system: Secondary | ICD-10-CM

## 2023-11-14 DIAGNOSIS — I2510 Atherosclerotic heart disease of native coronary artery without angina pectoris: Secondary | ICD-10-CM | POA: Diagnosis not present

## 2023-11-14 DIAGNOSIS — I739 Peripheral vascular disease, unspecified: Secondary | ICD-10-CM | POA: Diagnosis not present

## 2023-11-14 DIAGNOSIS — R739 Hyperglycemia, unspecified: Secondary | ICD-10-CM

## 2023-11-14 LAB — BASIC METABOLIC PANEL
Anion gap: 5 (ref 5–15)
BUN: 9 mg/dL (ref 8–23)
CO2: 21 mmol/L — ABNORMAL LOW (ref 22–32)
Calcium: 8.3 mg/dL — ABNORMAL LOW (ref 8.9–10.3)
Chloride: 104 mmol/L (ref 98–111)
Creatinine, Ser: 1.08 mg/dL (ref 0.61–1.24)
GFR, Estimated: >60 mL/min (ref >60)
Glucose, Bld: 94 mg/dL (ref 70–99)
Potassium: 4.2 mmol/L (ref 3.5–5.1)
Sodium: 130 mmol/L — ABNORMAL LOW (ref 135–145)

## 2023-11-14 LAB — GLUCOSE, CAPILLARY
Glucose-Capillary: 106 mg/dL — ABNORMAL HIGH (ref 70–99)
Glucose-Capillary: 114 mg/dL — ABNORMAL HIGH (ref 70–99)
Glucose-Capillary: 148 mg/dL — ABNORMAL HIGH (ref 70–99)
Glucose-Capillary: 156 mg/dL — ABNORMAL HIGH (ref 70–99)
Glucose-Capillary: 158 mg/dL — ABNORMAL HIGH (ref 70–99)
Glucose-Capillary: 76 mg/dL (ref 70–99)
Glucose-Capillary: 76 mg/dL (ref 70–99)
Glucose-Capillary: 78 mg/dL (ref 70–99)
Glucose-Capillary: 89 mg/dL (ref 70–99)
Glucose-Capillary: 90 mg/dL (ref 70–99)
Glucose-Capillary: 91 mg/dL (ref 70–99)
Glucose-Capillary: 92 mg/dL (ref 70–99)
Glucose-Capillary: 93 mg/dL (ref 70–99)

## 2023-11-14 LAB — CBC
HCT: 36.2 % — ABNORMAL LOW (ref 39.0–52.0)
Hemoglobin: 11.2 g/dL — ABNORMAL LOW (ref 13.0–17.0)
MCH: 22 pg — ABNORMAL LOW (ref 26.0–34.0)
MCHC: 30.9 g/dL (ref 30.0–36.0)
MCV: 71.3 fL — ABNORMAL LOW (ref 80.0–100.0)
Platelets: 197 10*3/uL (ref 150–400)
RBC: 5.08 MIL/uL (ref 4.22–5.81)
RDW: 15.7 % — ABNORMAL HIGH (ref 11.5–15.5)
WBC: 12.1 10*3/uL — ABNORMAL HIGH (ref 4.0–10.5)
nRBC: 0 % (ref 0.0–0.2)

## 2023-11-14 LAB — MAGNESIUM: Magnesium: 2 mg/dL (ref 1.7–2.4)

## 2023-11-14 MED ORDER — INSULIN ASPART 100 UNIT/ML IJ SOLN
0.0000 [IU] | INTRAMUSCULAR | Status: DC
  Administered 2023-11-14 (×2): 2 [IU] via SUBCUTANEOUS
  Administered 2023-11-15: 8 [IU] via SUBCUTANEOUS
  Administered 2023-11-15 (×2): 4 [IU] via SUBCUTANEOUS
  Administered 2023-11-15 – 2023-11-16 (×3): 2 [IU] via SUBCUTANEOUS

## 2023-11-14 MED ORDER — INSULIN ASPART 100 UNIT/ML IJ SOLN: 2.0000 [IU] | INTRAMUSCULAR | Status: DC

## 2023-11-14 MED ORDER — ORAL CARE MOUTH RINSE: 15.0000 mL | OROMUCOSAL | Status: DC | PRN

## 2023-11-14 NOTE — Plan of Care (Signed)
Problem: Education: Goal: Knowledge of General Education information will improve Description: Including pain rating scale, medication(s)/side effects and non-pharmacologic comfort measures Outcome: Progressing   Problem: Activity: Goal: Risk for activity intolerance will decrease Outcome: Progressing   Problem: Coping: Goal: Level of anxiety will decrease Outcome: Progressing

## 2023-11-14 NOTE — Progress Notes (Signed)
NAME:  Donald Palmer, MRN:  [id number], DOB:  1961-02-24, LOS: 1 ADMISSION DATE:  11/13/2023, CONSULTATION DATE:  11/13/2023 REFERRING MD:  Nat Christen, CHIEF COMPLAINT:  s/p CABG   History of Present Illness:  62 year old male with past medical history of diabetes, chronic hepatitis C, hypertension, PAD, CAD who presented initially to [facility] for surgical evaluation of three-vessel CAD. Reportedly, found incidentally by Dr. Randie Heinz for LLE bypass surgery. No chest pain, shortness of breath. He underwent left heart cath on 10/16/2023 showing distal LM 40%, LAD mid=80%, Lcx 80%, RCA 75%, distal RCA occlusion, LVEF 45-50%. He had echo completed 10/22/23 demonstrating EF 55-60%, no RWMA, G1DD. From OR notes, appears to have had scheduled CABG 11/25 which was rescheduled to 11/27 for no show.   S/p CABG LIMA LAD, RSVG PDA, OM  Cross clamp: 45 Pump time: 80 EBL: 500  Pertinent  Medical History  diabetes, chronic hepatitis C, hypertension, PAD, CAD  Significant Hospital Events: Including procedures, antibiotic start and stop dates in addition to other pertinent events   11/27 CABG x 3  Interim History / Subjective:  Tmax 100.8. Denies complaints. Pain controlled.  Objective   Blood pressure 115/64, pulse 77, temperature (!) 100.4 F (38 C), temperature source Bladder, resp. rate 20, height 5\' 9"  (1.753 m), weight 88.6 kg, SpO2 94%. CVP:  [0 mmHg-17 mmHg] 15 mmHg CO:  [4.6 L/min-7 L/min] 4.8 L/min CI:  [2.7 L/min/m2-3.5 L/min/m2] 2.7 L/min/m2  Vent Mode: PSV;CPAP FiO2 (%):  [40 %-50 %] 40 % Set Rate:  [4 bmp-15 bmp] 4 bmp Vt Set:  [560 mL] 560 mL PEEP:  [5 cmH20] 5 cmH20 Pressure Support:  [10 cmH20] 10 cmH20 Plateau Pressure:  [14 cmH20-17 cmH20] 14 cmH20   Intake/Output Summary (Last 24 hours) at 11/14/2023 0726 Last data filed at 11/14/2023 0700 Gross per 24 hour  Intake 4831.27 ml  Output 2920 ml  Net 1911.27 ml   Filed Weights   11/13/23 0546 11/14/23 0600  Weight: 85.3 kg  88.6 kg    Examination: General: chronically ill appearing man lying in bed in NAD HENT: [place]/AT, eyes anicteric Lungs: breathing comfortably on [place], reduced basilar breath sounds Cardiovascular: S1S2, RRR. No blood on sternal dressing.  Abdomen: soft, NT Extremities: no edema, no cyanosis Neuro: awake, alert, answering questions appropriately. GU: foley with amber urine  Chest tube 450cc  Na+ 130 BUN 9 Cr 1.08 WBC 12.1 H/H 11.2/36.2 Platelets 197 CXR personally reviewed> low volumes, small left effusion  Resolved Hospital Problem list   N/A  Assessment & Plan:  CAD s/p CABG x3; EF 55-60% pre-op -post op management per [facility] -complete post-op antibiotics -off neosynephrine; can start metoprolol today -con't chest tubes -con't aspirin, start plavix & eliquis when chest tubes out (for PAD) -post-op pain control per protocol- oxy, tramadol, morphine -advance mobility as tolerated  Post-op fever -monitor   Postoperative ventilator management  -pulmonary hygiene -wean O2 as able    Hypertension  Hyperlipidemia  -daily statin -start metoprolol -hold PTA losartan for now  Post-op ABLA, expected -transfuse for Hb <7 or hemodynamically significant bleeding -monitor  Hyponatremia -needs diuresis  Diabetes; A1c 8.7  -off insulin gtt this morning; start SSI PRN -goal BG 140-180 -hold PTA pioglitazone, insulin degludec  PAD; LLE stenosis of left common femoral artery and proximal SFA with occlusion of distal SFA  - per Dr. Randie Heinz note from 09/09/23 to have left common femoral artery endarterectomy with bypass to popliteal after CAD revascularization  -eventually will go  back on plavix  + eliquis  Chronic hepatitis C  - intermittently monitor LFT    Best Practice (right click and "Reselect all SmartList Selections" daily)   Diet/type: NPO DVT prophylaxis: not indicated Pressure ulcer(s). none GI prophylaxis: PPI Lines: Central line Foley:  Yes, and it is still  needed Code Status:  full code Last date of multidisciplinary goals of care discussion [discussed with [facility]]  Labs   CBC: Recent Labs  Lab 11/11/23 1002 11/11/23 1028 11/13/23 0831 11/13/23 1035 11/13/23 1126 11/13/23 1225 11/13/23 1227 11/13/23 1550 11/13/23 1825 11/14/23 0410  WBC 4.8 PATIENT IDENTIFICATION ERROR. PLEASE DISREGARD RESULTS. ACCOUNT WILL BE CREDITED.  --   --   --   --  7.9  --  11.5* 12.1*  HGB 13.2 PATIENT IDENTIFICATION ERROR. PLEASE DISREGARD RESULTS. ACCOUNT WILL BE CREDITED.   < > 9.8*   < > 11.9* 9.8* 12.9* 11.5* 11.2*  HCT 43.7 PATIENT IDENTIFICATION ERROR. PLEASE DISREGARD RESULTS. ACCOUNT WILL BE CREDITED.   < > 31.6*   < > 35.0* 31.7* 38.0* 37.2* 36.2*  MCV 72.7* PATIENT IDENTIFICATION ERROR. PLEASE DISREGARD RESULTS. ACCOUNT WILL BE CREDITED.  --   --   --   --  71.6*  --  72.2* 71.3*  PLT 282 PATIENT IDENTIFICATION ERROR. PLEASE DISREGARD RESULTS. ACCOUNT WILL BE CREDITED.  --  219  --   --  173  --  219 197   < > = values in this interval not displayed.    Basic Metabolic Panel: Recent Labs  Lab 11/11/23 1028 11/13/23 0831 11/13/23 0928 11/13/23 1002 11/13/23 1030 11/13/23 1126 11/13/23 1129 11/13/23 1225 11/13/23 1550 11/13/23 1825 11/14/23 0410  NA 135   < > 139   < > 138   < > 141 141 140 135 130*  K 4.2   < > 3.9   < > 4.6   < > 4.0 4.0 4.0 4.4 4.2  CL 101   < > 105  --  104  --  106  --   --  108 104  CO2 26  --   --   --   --   --   --   --   --  19* 21*  GLUCOSE 178*   < > 104*  --  97  --  88  --   --  137* 94  BUN 7*   < > 7*  --  7*  --  7*  --   --  7* 9  CREATININE 1.11   < > 0.90  --  0.80  --  0.80  --   --  0.99 1.08  CALCIUM 9.5  --   --   --   --   --   --   --   --  8.2* 8.3*  MG  --   --   --   --   --   --   --   --   --  2.4 2.0   < > = values in this interval not displayed.    Critical care time:      Steffanie Dunn, DO 11/14/23 8:23 AM [facility]  For contact information, see Amion. If  no response to pager, please call PCCM consult pager. After hours, 7PM- 7AM, please call Elink.

## 2023-11-14 NOTE — Progress Notes (Signed)
LUE arterial duplex has been completed.  Preliminary results given to Dr. Chestine Spore.   Results can be found under chart review under CV PROC. 11/14/2023 3:26 PM Shela Esses RVT, RDMS

## 2023-11-14 NOTE — Progress Notes (Signed)
[address]       [place] [address]             [phone]                 1 Day Post-Op Procedure(s) (LRB): CORONARY ARTERY BYPASS GRAFTING X 3, USING LEFT INTERNAL MAMMARY ARTERY AND ENDOSCOPICALLY HARVESTED RIGHT SAPHENOUS VEIN GRAFT (N/A) TRANSESOPHAGEAL ECHOCARDIOGRAM (N/A)   Events: No events _______________________________________________________________ Vitals: BP 134/87 (BP Location: Right Arm)   Pulse 78   Temp (!) 100.4 F (38 C) (Bladder)   Resp 19   Ht 5\' 9"  (1.753 m)   Wt 88.6 kg   SpO2 95%   BMI 28.84 kg/m  Filed Weights   11/13/23 0546 11/14/23 0600  Weight: 85.3 kg 88.6 kg     - Neuro: alert NAD  - Cardiovascular: sinus  Drips: none.   CVP:  [0 mmHg-17 mmHg] 14 mmHg CO:  [4.6 L/min-7 L/min] 5.8 L/min CI:  [2.7 L/min/m2-3.5 L/min/m2] 2.9 L/min/m2  - Pulm: EWOB Vent Mode: PSV;CPAP FiO2 (%):  [40 %-50 %] 40 % Set Rate:  [4 bmp-15 bmp] 4 bmp Vt Set:  [560 mL] 560 mL PEEP:  [5 cmH20] 5 cmH20 Pressure Support:  [10 cmH20] 10 cmH20 Plateau Pressure:  [14 cmH20-17 cmH20] 14 cmH20  ABG    Component Value Date/Time   PHART 7.301 (L) 11/13/2023 1550   PCO2ART 40.1 11/13/2023 1550   PO2ART 72 (L) 11/13/2023 1550   HCO3 19.6 (L) 11/13/2023 1550   TCO2 21 (L) 11/13/2023 1550   ACIDBASEDEF 6.0 (H) 11/13/2023 1550   O2SAT 92 11/13/2023 1550    - Abd: ND - Extremity: warm, equal bilaterally  .Intake/Output      11/27 0701 11/28 0700 11/28 0701 11/29 0700   P.O. 694    I.V. (mL/kg) 2198 (24.8) 14.1 (0.2)   Blood 345    IV Piggyback 1594.3    Total Intake(mL/kg) 4831.3 (54.5) 14.1 (0.2)   Urine (mL/kg/hr) 2470 (1.2) 35 (0.2)   Emesis/NG output 0    Stool 0    Blood 0    Chest Tube 450 30   Total Output 2920 65   Net +1911.3 -50.9           _______________________________________________________________ Labs:    Latest Ref Rng & Units 11/14/2023    4:10 AM 11/13/2023    6:25 PM 11/13/2023    3:50 PM   CBC  WBC 4.0 - 10.5 K/uL 12.1  11.5    Hemoglobin 13.0 - 17.0 g/dL 13.0  86.5  78.4   Hematocrit 39.0 - 52.0 % 36.2  37.2  38.0   Platelets 150 - 400 K/uL 197  219        Latest Ref Rng & Units 11/14/2023    4:10 AM 11/13/2023    6:25 PM 11/13/2023    3:50 PM  CMP  Glucose 70 - 99 mg/dL 94  696    BUN 8 - 23 mg/dL 9  7    Creatinine 2.95 - 1.24 mg/dL 2.84  1.32    Sodium 440 - 145 mmol/L 130  135  140   Potassium 3.5 - 5.1 mmol/L 4.2  4.4  4.0   Chloride 98 - 111 mmol/L 104  108    CO2 22 - 32 mmol/L 21  19    Calcium 8.9 - 10.3 mg/dL 8.3  8.2    Total Protein 6.5 - 8.1 g/dL  5.9  Total Bilirubin <1.2 mg/dL  0.5    Alkaline Phos 38 - 126 U/L  47    AST 15 - 41 U/L  33    ALT 0 - 44 U/L  17      CXR: clear  _______________________________________________________________  Assessment and Plan: POD 1 s/p CABG  Neuro: pain controled CV: On  A/S/BB.  Will remove A line and wires Pulm: IS, ambulation Renal: creat stable GI: on diet Heme: stable ID: [email or web address] Endo: SSI Dispo: possible floor today   Sharma Lawrance O Cailean Heacock 11/14/2023 8:45 AM

## 2023-11-15 ENCOUNTER — Inpatient Hospital Stay (HOSPITAL_COMMUNITY): Payer: Medicare HMO

## 2023-11-15 DIAGNOSIS — Z951 Presence of aortocoronary bypass graft: Secondary | ICD-10-CM

## 2023-11-15 DIAGNOSIS — I10 Essential (primary) hypertension: Secondary | ICD-10-CM | POA: Diagnosis not present

## 2023-11-15 LAB — GLUCOSE, CAPILLARY
Glucose-Capillary: 108 mg/dL — ABNORMAL HIGH (ref 70–99)
Glucose-Capillary: 143 mg/dL — ABNORMAL HIGH (ref 70–99)
Glucose-Capillary: 156 mg/dL — ABNORMAL HIGH (ref 70–99)
Glucose-Capillary: 175 mg/dL — ABNORMAL HIGH (ref 70–99)
Glucose-Capillary: 180 mg/dL — ABNORMAL HIGH (ref 70–99)
Glucose-Capillary: 231 mg/dL — ABNORMAL HIGH (ref 70–99)

## 2023-11-15 LAB — CBC
HCT: 34.5 % — ABNORMAL LOW (ref 39.0–52.0)
Hemoglobin: 10.5 g/dL — ABNORMAL LOW (ref 13.0–17.0)
MCH: 21.9 pg — ABNORMAL LOW (ref 26.0–34.0)
MCHC: 30.4 g/dL (ref 30.0–36.0)
MCV: 71.9 fL — ABNORMAL LOW (ref 80.0–100.0)
Platelets: 187 10*3/uL (ref 150–400)
RBC: 4.8 MIL/uL (ref 4.22–5.81)
RDW: 15.9 % — ABNORMAL HIGH (ref 11.5–15.5)
WBC: 15.4 10*3/uL — ABNORMAL HIGH (ref 4.0–10.5)
nRBC: 0 % (ref 0.0–0.2)

## 2023-11-15 LAB — BASIC METABOLIC PANEL
Anion gap: 6 (ref 5–15)
BUN: 10 mg/dL (ref 8–23)
CO2: 25 mmol/L (ref 22–32)
Calcium: 9 mg/dL (ref 8.9–10.3)
Chloride: 101 mmol/L (ref 98–111)
Creatinine, Ser: 1.19 mg/dL (ref 0.61–1.24)
GFR, Estimated: >60 mL/min (ref >60)
Glucose, Bld: 184 mg/dL — ABNORMAL HIGH (ref 70–99)
Potassium: 4.3 mmol/L (ref 3.5–5.1)
Sodium: 132 mmol/L — ABNORMAL LOW (ref 135–145)

## 2023-11-15 LAB — MAGNESIUM: Magnesium: 1.8 mg/dL (ref 1.7–2.4)

## 2023-11-15 MED ORDER — CLOPIDOGREL BISULFATE 75 MG PO TABS
75.0000 mg | ORAL_TABLET | Freq: Every day | ORAL | Status: DC
  Administered 2023-11-15 – 2023-11-18 (×4): 75 mg via ORAL
  Filled 2023-11-15 (×4): qty 1

## 2023-11-15 MED ORDER — ENOXAPARIN SODIUM 40 MG/0.4ML IJ SOSY
40.0000 mg | PREFILLED_SYRINGE | INTRAMUSCULAR | Status: DC
  Administered 2023-11-15 – 2023-11-16 (×2): 40 mg via SUBCUTANEOUS
  Filled 2023-11-15 (×2): qty 0.4

## 2023-11-15 MED ORDER — ASPIRIN 81 MG PO TBEC
81.0000 mg | DELAYED_RELEASE_TABLET | Freq: Every day | ORAL | Status: DC
  Administered 2023-11-15 – 2023-11-18 (×4): 81 mg via ORAL
  Filled 2023-11-15 (×4): qty 1

## 2023-11-15 MED ORDER — ~~LOC~~ CARDIAC SURGERY, PATIENT & FAMILY EDUCATION: Freq: Once | Status: AC

## 2023-11-15 MED ORDER — METOPROLOL TARTRATE 25 MG PO TABS
25.0000 mg | ORAL_TABLET | Freq: Two times a day (BID) | ORAL | Status: DC
  Administered 2023-11-15 – 2023-11-18 (×7): 25 mg via ORAL
  Filled 2023-11-15 (×7): qty 1

## 2023-11-15 MED ORDER — POTASSIUM CHLORIDE CRYS ER 20 MEQ PO TBCR
40.0000 meq | EXTENDED_RELEASE_TABLET | Freq: Every day | ORAL | Status: DC
  Administered 2023-11-15 – 2023-11-16 (×2): 40 meq via ORAL
  Filled 2023-11-15 (×2): qty 2

## 2023-11-15 MED ORDER — FUROSEMIDE 40 MG PO TABS
40.0000 mg | ORAL_TABLET | Freq: Every day | ORAL | Status: DC
  Administered 2023-11-15: 40 mg via ORAL
  Filled 2023-11-15: qty 1

## 2023-11-15 NOTE — Discharge Summary (Signed)
Physician Discharge Summary  Patient ID: MAVRYCK COGBURN MRN: [id number] DOB/AGE: 1961-10-02 62 y.o.  Admit date: 11/13/2023 Discharge date: 11/19/2023  Admission Diagnoses:  Multivessel coronary artery disease Type 2 diabetes mellitus Dyslipidemia Gastroesophageal reflux disease History of hypertension History of gout Peripheral vascular disease  Discharge Diagnoses:   Multivessel coronary artery disease Type 2 diabetes mellitus Dyslipidemia Gastroesophageal reflux disease History of hypertension History of gout Peripheral vascular disease Principal Problem:   S/P CABG x 3 Expected acute blood loss anemia  Discharged Condition: good  Referring: Patwardhan, Anabel Bene, MD Primary Care: none  *Follow up appts requested       History of Present Illness:     Donald Palmer 62 y.o. male presents for surgical evaluation of three-vessel coronary artery disease.  This was found incidentally while he was being evaluated for left lower extremity bypass surgery with Dr. Randie Heinz.  He denies any chest pain or exertional dyspnea.  He is very limited in his mobility due to his left lower extremity medication. Mr. Klotz has a history of diabetes mellitus,  GERD, dyslipidemia, hypertension, and chronic hepatitis C.  62 year old male with three-vessel coronary disease. He also has claudication and his left lower extremity, and is scheduled for left femoral endarterectomy and femoral to popliteal bypass., Review of his left heart catheterization and he has good targets in his LAD, and PDA. His left heart catheterization reveals normal function without any significant valvular disease. We discussed risks and benefits of CABG, and I also discussed the possibility of decreased blood flow to his leg in the perioperative period given his severe peripheral vascular disease. He is agreeable to proceed.   Hospital Course: [name] was admitted for elective surgery on 11/13/23 and taken to the OR where CABG x  3 was performed by Dr. Cliffton Asters without complication. Following the procedure he separated from cardiopulmonary bypass without difficulty and he was transferred to the surgical ICU in stable condition. He was extubated the evening of surgery without complication. Drips were weaned as hemodynamics tolerated on POD1, arterial line was removed without complication. Low dose beta blocker was started. Plavix was restarted for his peripheral vascular disease. Epicardial pacing wires and chest tubes were removed without complication. He was felt stable for transfer to the progressive unit. He was saturating well on 2L [place], flutter valve was added. Oxygen was weaned as tolerated. Home Eliquis was restarted for PAD.  He continued to progress in his physical recovery using standard post cardiac surgical protocols.  Incisions are all all healing well without evidence of infection.  He is tolerating diet.  Overall, at the time of discharge the patient was felt to be quite stable.    Consults: pulmonary/intensive care  Significant Diagnostic Studies:   DG Chest Port 1 View  Result Date: 11/15/2023 CLINICAL DATA:  Follow-up study.  Status post CABG surgery. EXAM: PORTABLE CHEST 1 VIEW COMPARISON:  11/06/2023 and earlier studies. FINDINGS: Lung volumes remain low. Left greater than right basilar atelectasis is stable from the previous day's exam. Remainder of the lungs is clear. Previously noted interstitial edema has resolved. No pneumothorax. Right internal jugular central venous catheter, Swan-Ganz catheter and left inferior hemithorax chest tube are stable. No mediastinal widening. IMPRESSION: 1. No acute findings or evidence of an operative complication. 2. Resolved interstitial edema. 3. Persist mild, left greater than right, lung base opacity consistent with atelectasis. 4. Support apparatus is stable and well positioned. Electronically Signed   By: Amie Portland M.D.   On:  11/15/2023 07:57   VAS [place] UPPER  EXTREMITY ARTERIAL DUPLEX  Result Date: 11/14/2023  UPPER EXTREMITY DUPLEX STUDY Patient Name:  Donald Palmer  Date of Exam:   11/14/2023 Medical Rec #: [id number]    Accession #:    [id number] Date of Birth: 09-Nov-1961    Patient Gender: M Patient Age:   43 years Exam Location:  [facility] Procedure:      VAS [place] UPPER EXTREMITY ARTERIAL DUPLEX Referring Phys: Karie Fetch --------------------------------------------------------------------------------  Indications: Digital ischemia distal to arterial line. History:     Patient has a history of S/P CABG x 3 (11/13/2023).  Risk Factors:  Hypertension, hyperlipidemia, Diabetes, current smoker, coronary                artery disease. Other Factors: PAD Limitations: IV placement Comparison Study: No previous exams Performing Technologist: Jody Hill RVT, RDMS  Examination Guidelines: A complete evaluation includes B-mode imaging, spectral Doppler, color Doppler, and power Doppler as needed of all accessible portions of each vessel. Bilateral testing is considered an integral part of a complete examination. Limited examinations for reoccurring indications may be performed as noted.  Left Doppler Findings: +---------------+----------+---------+--------+-------------------------------+ Site           PSV (cm/s)Waveform StenosisComments                        +---------------+----------+---------+--------+-------------------------------+ Subclavian Prox120       triphasic                                        +---------------+----------+---------+--------+-------------------------------+ Subclavian Mid 70        biphasic                                         +---------------+----------+---------+--------+-------------------------------+ Subclavian Dist47        biphasic                                         +---------------+----------+---------+--------+-------------------------------+ Axillary       60        biphasic                                          +---------------+----------+---------+--------+-------------------------------+ Brachial Prox  81        triphasic                                        +---------------+----------+---------+--------+-------------------------------+ Brachial Mid   62        triphasic                                        +---------------+----------+---------+--------+-------------------------------+ Brachial Dist  63        biphasic                                         +---------------+----------+---------+--------+-------------------------------+  Radial Prox                               Unable to insonate - IV                                                   placement                       +---------------+----------+---------+--------+-------------------------------+ Radial Mid     52        triphasic                                        +---------------+----------+---------+--------+-------------------------------+ Radial Dist    59        biphasic                                         +---------------+----------+---------+--------+-------------------------------+ Ulnar Prox                                Unable to insonate - IV                                                   placement                       +---------------+----------+---------+--------+-------------------------------+ Ulnar Mid      58        biphasic                                         +---------------+----------+---------+--------+-------------------------------+ Ulnar Dist     64        biphasic                                         +---------------+----------+---------+--------+-------------------------------+ Palmar Arch    23        biphasic                                         +---------------+----------+---------+--------+-------------------------------+   Summary:  Left: No obstruction visualized in the left upper extremity. *See  table(s) above for measurements and observations. Electronically signed by Sherald Hess MD on 11/14/2023 at 4:01:06 PM.    Final    DG Chest Port 1 View  Result Date: 11/14/2023 CLINICAL DATA:  Status post CABG surgery. EXAM: PORTABLE CHEST 1 VIEW COMPARISON:  11/13/2023 and older studies. FINDINGS: Since the previous exam the endotracheal tube has been removed. Femoral a placed Swan-Ganz catheter, right internal jugular central venous line and left inferior me thorax chest tube are stable. Cardiac silhouette  normal in size.  No mediastinal widening. Lung volumes are low with lung base bronchovascular crowding and atelectasis, greater on the left. Mild interstitial thickening appears improved from the previous day's exam. Probable small effusions. No pneumothorax. IMPRESSION: 1. Improved interstitial edema. 2. Low lung volumes with basilar atelectasis and probable small effusions. 3. Remaining support apparatus is stable and well positioned. Electronically Signed   By: Amie Portland M.D.   On: 11/14/2023 08:05   DG Chest 2 View  Result Date: 11/14/2023 CLINICAL DATA:  [id number] Pre-op chest exam [id number] EXAM: CHEST - 2 VIEW COMPARISON:  Chest x-ray 03/03/2018 FINDINGS: The heart and mediastinal contours are unchanged. Atherosclerotic plaque. No focal consolidation. No pulmonary edema. No pleural effusion. No pneumothorax. No acute osseous abnormality. IMPRESSION: 1. No active cardiopulmonary disease. 2.  Aortic Atherosclerosis (ICD10-I70.0). Electronically Signed   By: Tish Frederickson M.D.   On: 11/14/2023 01:30   DG Chest Port 1 View  Result Date: 11/13/2023 CLINICAL DATA:  Post CABG EXAM: PORTABLE CHEST 1 VIEW COMPARISON:  11/11/2023 FINDINGS: Endotracheal tube 2.6 cm from carina. Post midline sternotomy. Swan-Ganz catheter, RIGHT IJ, and LEFT chest tube in good position. No pneumothorax. Mild interstitial edema pattern. IMPRESSION: 1. Support apparatus appears well position. 2. Mild interstitial  edema pattern. 3. No pneumothorax. Electronically Signed   By: Genevive Bi M.D.   On: 11/13/2023 14:28   ECHO INTRAOPERATIVE TEE  Result Date: 11/13/2023  *INTRAOPERATIVE TRANSESOPHAGEAL REPORT *  Patient Name:   Donald Palmer    Date of Exam: 11/13/2023 Medical Rec #:  [id number]      Height:       69.0 in Accession #:    [id number]     Weight:       188.0 lb Date of Birth:  11-16-1961      BSA:          2.01 m Patient Age:    62 years       BP: Patient Gender: M              HR: Exam Location:  Anesthesiology Transesophogeal exam was perform intraoperatively during surgical procedure. Patient was closely monitored under general anesthesia during the entirety of examination. Performing Phys: Complications: No known complications during this procedure. POST-OP IMPRESSIONS _ Left Ventricle: The left ventricle is unchanged from pre-bypass. _ Right Ventricle: The right ventricle appears unchanged from pre-bypass. _ Aorta: The aorta appears unchanged from pre-bypass. _ Aortic Valve: The aortic valve appears unchanged from pre-bypass. _ Mitral Valve: The mitral valve appears unchanged from pre-bypass. _ Tricuspid Valve: The tricuspid valve appears unchanged from pre-bypass. _ Pulmonic Valve: The pulmonic valve appears unchanged from pre-bypass. _ Pericardium: The pericardium appears unchanged from pre-bypass. PRE-OP FINDINGS  Left Ventricle: The left ventricle has mildly reduced systolic function, with an ejection fraction of 45-50%. The cavity size was normal. There is no increase in left ventricular wall thickness. Right Ventricle: The right ventricle has normal systolic function. The cavity was normal. Left Atrium: No left atrial/left atrial appendage thrombus was detected. Interatrial Septum: No atrial level shunt detected by color flow Doppler. There is no evidence of a patent foramen ovale. Pericardium: There is no evidence of pericardial effusion. There is no pleural effusion. Mitral Valve: The mitral valve  is normal in structure. Mitral valve regurgitation is trivial by color flow Doppler. Tricuspid Valve: The tricuspid valve was normal in structure. Tricuspid valve regurgitation is trivial by color flow Doppler. Aortic Valve: The aortic valve is normal in  structure. Aortic valve regurgitation was not visualized by color flow Doppler. Pulmonic Valve: The pulmonic valve was normal in structure. Pulmonic valve regurgitation is trivial by color flow Doppler. Aorta: The aortic root and ascending aorta are normal in size and structure.  Anice Paganini Electronically signed by Anice Paganini Signature Date/Time: 11/13/2023/12:43:28 PM    Final    EP STUDY  Result Date: 11/13/2023 See surgical note for result.  VAS US DOPPLER PRE CABG  Result Date: 11/12/2023 PREOPERATIVE VASCULAR EVALUATION Patient Name:  ZAYIR GANTERT  Date of Exam:   11/11/2023 Medical Rec #: [id number]    Accession #:    [id number] Date of Birth: Sep 05, 1961    Patient Gender: M Patient Age:   16 years Exam Location:  [facility] Procedure:      VAS US DOPPLER PRE CABG Referring Phys: HARRELL LIGHTFOOT --------------------------------------------------------------------------------  Indications:      Pre-CABG. Risk Factors:     Hypertension, hyperlipidemia, Diabetes, PAD. Comparison Study: Reduced ABIs seen since prior exam 08/28/23 Performing Technologist: Shona Simpson  Examination Guidelines: A complete evaluation includes B-mode imaging, spectral Doppler, color Doppler, and power Doppler as needed of all accessible portions of each vessel. Bilateral testing is considered an integral part of a complete examination. Limited examinations for reoccurring indications may be performed as noted.  Right Carotid Findings: +----------+--------+--------+--------+--------+------------------+           PSV cm/sEDV cm/sStenosisDescribeComments           +----------+--------+--------+--------+--------+------------------+ CCA Prox  90      17                                          +----------+--------+--------+--------+--------+------------------+ CCA Distal83      21                      intimal thickening +----------+--------+--------+--------+--------+------------------+ ICA Prox  71      13      1-39%   calcific                   +----------+--------+--------+--------+--------+------------------+ ICA Distal57      22                                         +----------+--------+--------+--------+--------+------------------+ ECA       151     19                                         +----------+--------+--------+--------+--------+------------------+ +----------+--------+-------+--------+------------+           PSV cm/sEDV cmsDescribeArm Pressure +----------+--------+-------+--------+------------+ Subclavian146     0                           +----------+--------+-------+--------+------------+ +---------+--------+--+--------+--+ VertebralPSV cm/s47EDV cm/s17 +---------+--------+--+--------+--+ Left Carotid Findings: +----------+--------+--------+--------+--------+------------------+           PSV cm/sEDV cm/sStenosisDescribeComments           +----------+--------+--------+--------+--------+------------------+ CCA Prox  100     18                                         +----------+--------+--------+--------+--------+------------------+  CCA Distal91      19                      intimal thickening +----------+--------+--------+--------+--------+------------------+ ICA Prox  50      14      1-39%   calcific                   +----------+--------+--------+--------+--------+------------------+ ICA Distal57      22                                         +----------+--------+--------+--------+--------+------------------+ ECA       115     13                                         +----------+--------+--------+--------+--------+------------------+  +----------+--------+--------+--------+------------+ SubclavianPSV cm/sEDV cm/sDescribeArm Pressure +----------+--------+--------+--------+------------+           93      0                            +----------+--------+--------+--------+------------+ +---------+--------+--+--------+--+ VertebralPSV cm/s47EDV cm/s18 +---------+--------+--+--------+--+  ABI Findings: +------------------+-----+----------+ Rt Pressure (mmHg)IndexWaveform   +------------------+-----+----------+ 133                    triphasic  +------------------+-----+----------+ 134               0.99 triphasic  +------------------+-----+----------+ 109               0.81 monophasic +------------------+-----+----------+ 67                0.50 Normal     +------------------+-----+----------+ +------------------+-----+-------------------+ Lt Pressure (mmHg)IndexWaveform            +------------------+-----+-------------------+ 135                    triphasic           +------------------+-----+-------------------+ 86                0.64 dampened monophasic +------------------+-----+-------------------+ 73                0.54 dampened monophasic +------------------+-----+-------------------+ 35                0.26 Abnormal            +------------------+-----+-------------------+ +-------+---------------+----------------+ ABI/TBIToday's ABI/TBIPrevious ABI/TBI +-------+---------------+----------------+ Right  0.99/0.5       0.96/0.46        +-------+---------------+----------------+ Left   0.64/0.26      0.55/0.2         +-------+---------------+----------------+ Bilateral ABIs and TBIs appear essentially unchanged compared to prior study on 08/28/23. Right Doppler Findings: +--------+--------+---------+ Site    PressureDoppler   +--------+--------+---------+ [id number]     triphasic +--------+--------+---------+ Radial          triphasic +--------+--------+---------+  Ulnar           triphasic +--------+--------+---------+  Left Doppler Findings: +--------+--------+---------+ Site    PressureDoppler   +--------+--------+---------+ [id number]     triphasic +--------+--------+---------+ Radial          triphasic +--------+--------+---------+ Ulnar           triphasic +--------+--------+---------+   Summary: Right Carotid: Velocities in the right ICA are consistent with a  1-39% stenosis. Left Carotid: Velocities in the left ICA are consistent with a 1-39% stenosis. Vertebrals:  Bilateral vertebral arteries demonstrate antegrade flow. Subclavians: Normal flow hemodynamics were seen in bilateral subclavian              arteries. Right ABI: Resting right ankle-brachial index is within normal range. The right toe-brachial index is abnormal. Left ABI: Resting left ankle-brachial index indicates moderate left lower extremity arterial disease. The left toe-brachial index is abnormal. Right Upper Extremity: Doppler waveform obliterate with right radial compression. Doppler waveforms decrease <50% with right ulnar compression. Left Upper Extremity: Doppler waveforms decrease 50% with left radial compression. Doppler waveforms decrease >50% with left ulnar compression.  Electronically signed by [name] MD on 11/12/2023 at 2:34:31 PM.    Final    ECHOCARDIOGRAM COMPLETE  Result Date: 10/22/2023    ECHOCARDIOGRAM REPORT   Patient Name:   Donald Palmer   Date of Exam: 10/22/2023 Medical Rec #:  [id number]     Height:       69.0 in Accession #:    [id number]    Weight:       190.0 lb Date of Birth:  [birth year]     BSA:          2.021 m Patient Age:    62 years      BP:           138/82 mmHg Patient Gender: M             HR:           75 bpm. Exam Location:  [address] Procedure: 2D Echo, Cardiac Doppler, Color Doppler and Strain Analysis Indications:    I10 Hypertension  History:        Patient has no prior history of Echocardiogram examinations.                 Risk  Factors:Diabetes and Hypertension.  Sonographer:    Daphine Deutscher RDCS Referring Phys: [id number] [facility] J PATWARDHAN IMPRESSIONS  1. Left ventricular ejection fraction, by estimation, is 55 to 60%. The left ventricle has normal function. The left ventricle has no regional wall motion abnormalities. Left ventricular diastolic parameters are consistent with Grade I diastolic dysfunction (impaired relaxation). The average left ventricular global longitudinal strain is -22.0 %. The global longitudinal strain is normal.  2. Right ventricular systolic function is normal. The right ventricular size is normal.  3. The mitral valve is normal in structure. Trivial mitral valve regurgitation. No evidence of mitral stenosis.  4. The aortic valve is tricuspid. Aortic valve regurgitation is not visualized. Aortic valve sclerosis/calcification is present, without any evidence of aortic stenosis.  5. The inferior vena cava is normal in size with greater than 50% respiratory variability, suggesting right atrial pressure of 3 mmHg. FINDINGS  Left Ventricle: Left ventricular ejection fraction, by estimation, is 55 to 60%. The left ventricle has normal function. The left ventricle has no regional wall motion abnormalities. The average left ventricular global longitudinal strain is -22.0 %. The global longitudinal strain is normal. The left ventricular internal cavity size was normal in size. There is no left ventricular hypertrophy. Left ventricular diastolic parameters are consistent with Grade I diastolic dysfunction (impaired relaxation). Normal left ventricular filling pressure. Right Ventricle: The right ventricular size is normal. No increase in right ventricular wall thickness. Right ventricular systolic function is normal. Left Atrium: Left atrial size was normal in size. Right Atrium: Right atrial size was normal in size.  Pericardium: There is no evidence of pericardial effusion. Mitral Valve: The mitral valve is normal  in structure. Trivial mitral valve regurgitation. No evidence of mitral valve stenosis. Tricuspid Valve: The tricuspid valve is normal in structure. Tricuspid valve regurgitation is not demonstrated. No evidence of tricuspid stenosis. Aortic Valve: The aortic valve is tricuspid. Aortic valve regurgitation is not visualized. Aortic valve sclerosis/calcification is present, without any evidence of aortic stenosis. Pulmonic Valve: The pulmonic valve was normal in structure. Pulmonic valve regurgitation is not visualized. No evidence of pulmonic stenosis. Aorta: The aortic root is normal in size and structure. Venous: The inferior vena cava is normal in size with greater than 50% respiratory variability, suggesting right atrial pressure of 3 mmHg. IAS/Shunts: No atrial level shunt detected by color flow Doppler.  LEFT VENTRICLE PLAX 2D LVIDd:         4.40 cm   Diastology LVIDs:         3.20 cm   LV e' medial:    5.62 cm/s LV PW:         1.00 cm   LV E/e' medial:  9.0 LV IVS:        1.00 cm   LV e' lateral:   5.26 cm/s LVOT diam:     2.10 cm   LV E/e' lateral: 9.6 LV SV:         54 LV SV Index:   27        2D Longitudinal Strain LVOT Area:     3.46 cm  2D Strain GLS (A2C):   -22.9 %                          2D Strain GLS (A3C):   -21.3 %                          2D Strain GLS (A4C):   -21.9 %                          2D Strain GLS Avg:     -22.0 % RIGHT VENTRICLE RV Basal diam:  3.10 cm RV S prime:     13.00 cm/s TAPSE (M-mode): 1.9 cm LEFT ATRIUM             Index        RIGHT ATRIUM           Index LA diam:        4.30 cm 2.13 cm/m   RA Area:     14.00 cm LA Vol (A2C):   41.2 ml 20.38 ml/m  RA Volume:   38.40 ml  19.00 ml/m LA Vol (A4C):   25.8 ml 12.76 ml/m LA Biplane Vol: 35.9 ml 17.76 ml/m  AORTIC VALVE LVOT Vmax:   85.57 cm/s LVOT Vmean:  56.067 cm/s LVOT VTI:    0.155 m  AORTA Ao Root diam: 3.60 cm Ao Asc diam:  3.40 cm MITRAL VALVE MV Area (PHT): 4.07 cm    SHUNTS MV Decel Time: 187 msec    Systemic VTI:   0.15 m MV E velocity: 50.30 cm/s  Systemic Diam: 2.10 cm MV A velocity: 85.95 cm/s MV E/A ratio:  0.59 Armanda Magic MD Electronically signed by Armanda Magic MD Signature Date/Time: 10/22/2023/4:16:23 PM    Final       Treatments: Surgery  11/13/2023 Patient:  Liliana Cline Pre-Op Dx: 3V  CAD PVD Claudication HTN HLP DM   Post-op Dx:  same Procedure: CABG X 3.  LIMA LAD, RSVG PDA, OM   Endoscopic greater saphenous vein harvest on the right     Surgeon and Role:      * Lightfoot, Eliezer Lofts, MD - Primary    * M. Roddenberry , PA-C - assisting Anesthesia  general EBL:  Blood Administration: none Xclamp Time:  45 min Pump Time:    Drains: 70 F blake drain: L, mediastinal  Wires: Ventricular Counts: correct     Indications: 77-year-old male with three-vessel coronary disease. He also has claudication and his left lower extremity, and is scheduled for left femoral endarterectomy and femoral to popliteal bypass., Review of his left heart catheterization and he has good targets in his LAD, and PDA. His left heart catheterization reveals normal function without any significant valvular disease. We discussed risks and benefits of CABG, and I also discussed the possibility of decreased blood flow to his leg in the perioperative period given his severe peripheral vascular disease. He is agreeable to proceed.    Findings: Good LIMA and vein.  Good distal targets  Discharge Exam:   General appearance: alert, cooperative, and no distress Heart: regular rate and rhythm Lungs: dim left base Abdomen: benign Extremities: no edema, warm Wound: incis healing well   Disposition: Discharge disposition: 01-Home or Self Care       Discharge Instructions     Amb Referral to Cardiac Rehabilitation   Complete by: As directed    Diagnosis: CABG   CABG X ___: 3   After initial evaluation and assessments completed: Virtual Based Care may be provided alone or in conjunction with  Phase 2 Cardiac Rehab based on patient barriers.: Yes   Intensive Cardiac Rehabilitation (ICR) [facility] location only OR Traditional Cardiac Rehabilitation (TCR) *If criteria for ICR are not met will enroll in TCR [facility] only): Yes   Discharge patient   Complete by: As directed    Discharge disposition: 01-Home or Self Care   Discharge patient date: 11/18/2023      Allergies as of 11/18/2023       Reactions   Metformin And Related Other (See Comments)   GI side effects. Stopped 2016        Medication List     STOP taking these medications    cyclobenzaprine 10 MG tablet Commonly known as: FLEXERIL   cyclobenzaprine 5 MG tablet Commonly known as: FLEXERIL   famotidine 20 MG tablet Commonly known as: PEPCID   ibuprofen 800 MG tablet Commonly known as: ADVIL   ketorolac 10 MG tablet Commonly known as: TORADOL   losartan 50 MG tablet Commonly known as: COZAAR       TAKE these medications    Accu-Chek Guide test strip Generic drug: glucose blood USE TO CHECK GLUCOSE TWICE DAILY   Accu-Chek Guide w/Device Kit Check blood sugar 2 times a day   Accu-Chek Softclix Lancets lancets check blood sugar 2 times a day   acetaminophen 325 MG tablet Commonly known as: TYLENOL Take 2 tablets (650 mg total) by mouth every 6 (six) hours.   apixaban 5 MG Tabs tablet Commonly known as: ELIQUIS Take 1 tablet (5 mg total) by mouth 2 (two) times daily. What changed: when to take this   atorvastatin 80 MG tablet Commonly known as: LIPITOR Take 1 tablet (80 mg total) by mouth daily at 6 PM.   blood glucose meter kit and supplies Kit Use  to check glucose twice a day   clopidogrel 75 MG tablet Commonly known as: PLAVIX Take 1 tablet (75 mg total) by mouth daily.   colchicine 0.6 MG tablet Take 0.6 mg by mouth daily as needed (gout flare).   fluticasone 50 MCG/ACT nasal spray Commonly known as: FLONASE USE 2 SPRAYS IN EACH NOSTRIL DAILY What changed:  how much to take how  to take this when to take this additional instructions   insulin degludec 100 UNIT/ML FlexTouch Pen Commonly known as: TRESIBA Inject 20 Units into the skin daily. What changed:  how much to take when to take this reasons to take this   Lancet Devices Misc 1 each by Does not apply route 2 (two) times daily. Please use to check blood sugar 2 times daily. diag code E11.40. noninsulin dependent   metoprolol tartrate 25 MG tablet Commonly known as: LOPRESSOR Take 1 tablet (25 mg total) by mouth 2 (two) times daily.   NERVIVE NERVE RELIEF PO Take 1 tablet by mouth daily.   omeprazole 40 MG capsule Commonly known as: PRILOSEC Take 1 capsule (40 mg total) by mouth daily.   oxyCODONE 5 MG immediate release tablet Commonly known as: Oxy IR/ROXICODONE Take 1 tablet (5 mg total) by mouth every 6 (six) hours as needed for severe pain (pain score 7-10).   Pen Needles 32G X 4 MM Misc Use to inject liraglutide once a day   pioglitazone 30 MG tablet Commonly known as: ACTOS Take 30 mg by mouth daily.        Follow-up Information     Jodelle Gross, NP Follow up on 12/02/2023.   Specialties: Cardiology, Radiology, Cardiology Why: 1:55PM. Cardiology post hospital follow up Contact information: [address] [place] [address] [phone]         Corliss Skains, MD Follow up.   Specialty: Cardiothoracic Surgery Why: Dr. Cliffton Asters will call you in 1 week as your first visit.  Future visits will be in person. Contact information: [address] [place] [address] [phone]                 The patient has been discharged on:   1.Beta Blocker:  Yes [ x ]                              No   [   ]                              If No, reason:  2.Ace Inhibitor/ARB: Yes [   ]                                     No  [ n    ]                                     If No, reason:BP well controlled  3.Statin:   Yes [x ]                   No  [   ]                  If No, reason:  4.Ecasa:  Yes  [ x ]                  No   [   ]                  If No, reason:  5. ACS on Admission?  No  P2Y12 Inhibitor:  Yes  [ X ]  for peripheral arterial disease                              No  [  ]    Signed: Glenice Laine Breydon Senters 11/19/2023, 8:26 AM

## 2023-11-15 NOTE — Progress Notes (Signed)
Pt admitted to rm 25 from [facility]. Initiated tele. Oriented pt to the unit. VSS. Call bell within reach.   Lawson Radar, RN

## 2023-11-15 NOTE — Discharge Instructions (Signed)
Discharge Instructions:  1. You may shower, please wash incisions daily with soap and water and keep dry.  If you wish to cover wounds with dressing you may do so but please keep clean and change daily.  No tub baths or swimming until incisions have completely healed.  If your incisions become red or develop any drainage please call our office at [phone]  2. No Driving until cleared by Dr. Lightfoot's office and you are no longer using narcotic pain medications  3. Monitor your weight daily.. Please use the same scale and weigh at same time... If you gain 5-10 lbs in 48 hours with associated lower extremity swelling, please contact our office at [phone]  4. Fever of 101.5 for at least 24 hours with no source, please contact our office at [phone]  5. Activity- up as tolerated, please walk at least 3 times per day.  Avoid strenuous activity, no lifting, pushing, or pulling with your arms over 8-10 lbs for a minimum of 6 weeks  6. If any questions or concerns arise, please do not hesitate to contact our office at [phone] 

## 2023-11-15 NOTE — Progress Notes (Signed)
NAME:  Donald Palmer, MRN:  [id number], DOB:  [birth year], LOS: 2 ADMISSION DATE:  11/13/2023, CONSULTATION DATE:  11/13/2023 REFERRING MD:  Nat Christen, CHIEF COMPLAINT:  s/p CABG   History of Present Illness:  62 year old male with past medical history of diabetes, chronic hepatitis C, hypertension, PAD, CAD who presented initially to [facility] for surgical evaluation of three-vessel CAD. Reportedly, found incidentally by Dr. Randie Heinz for LLE bypass surgery. No chest pain, shortness of breath. He underwent left heart cath on 10/16/2023 showing distal LM 40%, LAD mid=80%, Lcx 80%, RCA 75%, distal RCA occlusion, LVEF 45-50%. He had echo completed 10/22/23 demonstrating EF 55-60%, no RWMA, G1DD. From OR notes, appears to have had scheduled CABG 11/25 which was rescheduled to 11/27 for no show.   S/p CABG LIMA LAD, RSVG PDA, OM  Cross clamp: 45 Pump time: 80 EBL: 500  Pertinent  Medical History  diabetes, chronic hepatitis C, hypertension, PAD, CAD  Significant Hospital Events: Including procedures, antibiotic start and stop dates in addition to other pertinent events   11/27 CABG x 3  Interim History / Subjective:  Some pain, has a cough with sputum production that is his baseline. Afebrile overnight. Walked this morning.  Objective   Blood pressure 126/84, pulse 85, temperature 97.7 F (36.5 C), temperature source Oral, resp. rate (!) 22, height 5\' 9"  (1.753 m), weight 86.6 kg, SpO2 94%. CVP:  [11 mmHg-20 mmHg] 17 mmHg CO:  [5.8 L/min-7 L/min] 7 L/min CI:  [2.9 L/min/m2-3.4 L/min/m2] 3.4 L/min/m2      Intake/Output Summary (Last 24 hours) at 11/15/2023 0710 Last data filed at 11/15/2023 0600 Gross per 24 hour  Intake 1222.03 ml  Output 2508 ml  Net -1285.97 ml   Filed Weights   11/13/23 0546 11/14/23 0600 11/15/23 0410  Weight: 85.3 kg 88.6 kg 86.6 kg    Examination: General: chronically ill appearing man lying in bed in NAD  HENT: [place]/AT, eyes anicteric Lungs: breathing  comfortably on [place], CTAB, no observed coughing Cardiovascular: S1S2, RRR Abdomen: soft, NT Extremities: no cyanosis or edema Neuro: awake, alert, moving all extremities GU: foley with amber urine  Chest tube 80cc  Na+ 132 BUN 10 Cr 1.19 WBC 15.4 H/H 10.5/34.5 Platelets 187 CXR personally reviewed> low lung volumes, small left effusion vs infiltrate  Resolved Hospital Problem list   N/A  Assessment & Plan:  CAD s/p CABG x3; EF 55-60% pre-op -post-op management per [facility] -pain control per protocol -con't metoprolol, aspirin, plavix, statin. At baseline on eliquis and plavix for PAD -chest tubes out today; Aline removed previously with intact LUE circulation. -advance mobility as tolerated -pain control per protocol- tramadol, oxycodone -lasix  Post-op fever> resolved leukocytosis -monitor   Postoperative ventilator management  -pulmonary hygiene, wean off oxygen  Hypertension  Hyperlipidemia  -statin -metoprolol -holding PTA losartan  Post-op ABLA, expected -transfuse for Hb <7 or hemodynamically significant bleeding  Hyponatremia -lasix  Diabetes; A1c 8.7  -SSI PRN -goal BG 140-180 -May need mealtime insulin -holding PTA actos & Tresiba   PAD; LLE stenosis of left common femoral artery and proximal SFA with occlusion of distal SFA  - per Dr. Randie Heinz note from 09/09/23 to have left common femoral artery endarterectomy with bypass to popliteal after CAD revascularization  -eventually will need to go back on Eliquis and plavix  Chronic hepatitis C  - intermittently monitor LFT   H/o GERD -can resume PTA pepcid at discharge; currently on PPI  Best Practice (right click and "Reselect all SmartList  Selections" daily)   Diet/type: Regular consistency (see orders) DVT prophylaxis: not indicated Pressure ulcer(s). none GI prophylaxis: PPI Lines: Central line Foley:  Yes, and it is still needed Code Status:  full code Last date of multidisciplinary goals of  care discussion [discussed with [facility]]  Labs   CBC: Recent Labs  Lab 11/11/23 1028 11/13/23 0831 11/13/23 1035 11/13/23 1126 11/13/23 1227 11/13/23 1550 11/13/23 1825 11/14/23 0410 11/15/23 0451  WBC PATIENT IDENTIFICATION ERROR. PLEASE DISREGARD RESULTS. ACCOUNT WILL BE CREDITED.  --   --   --  7.9  --  11.5* 12.1* 15.4*  HGB PATIENT IDENTIFICATION ERROR. PLEASE DISREGARD RESULTS. ACCOUNT WILL BE CREDITED.   < > 9.8*   < > 9.8* 12.9* 11.5* 11.2* 10.5*  HCT PATIENT IDENTIFICATION ERROR. PLEASE DISREGARD RESULTS. ACCOUNT WILL BE CREDITED.   < > 31.6*   < > 31.7* 38.0* 37.2* 36.2* 34.5*  MCV PATIENT IDENTIFICATION ERROR. PLEASE DISREGARD RESULTS. ACCOUNT WILL BE CREDITED.  --   --   --  71.6*  --  72.2* 71.3* 71.9*  PLT PATIENT IDENTIFICATION ERROR. PLEASE DISREGARD RESULTS. ACCOUNT WILL BE CREDITED.  --  219  --  173  --  219 197 187   < > = values in this interval not displayed.    Basic Metabolic Panel: Recent Labs  Lab 11/11/23 1028 11/13/23 0831 11/13/23 1030 11/13/23 1126 11/13/23 1129 11/13/23 1225 11/13/23 1550 11/13/23 1825 11/14/23 0410 11/15/23 0451  NA 135   < > 138   < > 141 141 140 135 130* 132*  K 4.2   < > 4.6   < > 4.0 4.0 4.0 4.4 4.2 4.3  CL 101   < > 104  --  106  --   --  108 104 101  CO2 26  --   --   --   --   --   --  19* 21* 25  GLUCOSE 178*   < > 97  --  88  --   --  137* 94 184*  BUN 7*   < > 7*  --  7*  --   --  7* 9 10  CREATININE 1.11   < > 0.80  --  0.80  --   --  0.99 1.08 1.19  CALCIUM 9.5  --   --   --   --   --   --  8.2* 8.3* 9.0  MG  --   --   --   --   --   --   --  2.4 2.0  --    < > = values in this interval not displayed.    Critical care time:      Steffanie Dunn, DO 11/15/23 8:46 AM [facility]  For contact information, see Amion. If no response to pager, please call PCCM consult pager. After hours, 7PM- 7AM, please call Elink.

## 2023-11-15 NOTE — TOC Initial Note (Addendum)
Transition of Care [facility]) - Initial/Assessment Note    Patient Details  Name: Donald Palmer MRN: [id number] Date of Birth: 02-Oct-1961  [facility]) CM/SW Contact:    Elliot Cousin, RN Phone Number: [phone] 11/15/2023, 11:43 AM  Clinical Narrative:       CM spoke to pt and gave permission to speak to wife, Britta Mccreedy.  Pt states he has RW and cane at home.  Wife is working on [employer] or CAP program. States she works and needs assistance for him at home.  PCP completed applications.  Offered choice for [facility]. Pt agreeable to [facility] for HH. Will need HHRN and PT orders with F2F.   May need home oxygen.         [facility] declined due to staffing. Contacted [employer], [place]. Frances Furbish accepted referral HH.  Faxed [place] Medicaid PCS paperwork to [place].   Expected Discharge Plan: Home w Home Health Services Barriers to Discharge: Continued Medical Work up   Patient Goals and CMS Choice Patient states their goals for this hospitalization and ongoing recovery are:: wants to get better CMS Medicare.gov Compare Post Acute Care list provided to:: Patient Choice offered to / list presented to : Spouse      Expected Discharge Plan and Services   Discharge Planning Services: CM Consult Post Acute Care Choice: Home Health Living arrangements for the past 2 months: Single Family Home                       Prior Living Arrangements/Services Living arrangements for the past 2 months: Single Family Home Lives with:: Spouse Patient language and need for interpreter reviewed:: Yes Do you feel safe going back to the place where you live?: Yes      Need for Family Participation in Patient Care: Yes (Comment) Care giver support system in place?: Yes (comment) Current home services: DME (rolling walker, cane) Criminal Activity/Legal Involvement Pertinent to Current Situation/Hospitalization: No - Comment as needed  Activities of Daily Living   ADL Screening  (condition at time of admission) Independently performs ADLs?: Yes (appropriate for developmental age) Is the patient deaf or have difficulty hearing?: No Does the patient have difficulty seeing, even when wearing glasses/contacts?: No Does the patient have difficulty concentrating, remembering, or making decisions?: No  Permission Sought/Granted Permission sought to share information with : Case Manager, Family Supports, PCP Permission granted to share information with : Yes, Verbal Permission Granted  Share Information with NAME: Sharen Counter     Permission granted to share info w Relationship: wife  Permission granted to share info w Contact Information: [phone]  Emotional Assessment Appearance:: Appears stated age Attitude/Demeanor/Rapport: Engaged Affect (typically observed): Accepting Orientation: : Oriented to Self, Oriented to Place, Oriented to  Time, Oriented to Situation   Psych Involvement: No (comment)  Admission diagnosis:  S/P CABG x 3 [Z95.1] Patient Active Problem List   Diagnosis Date Noted   S/P CABG x 3 11/13/2023   Acute superficial venous thrombosis of right lower extremity 06/11/2022   Osteoarthritis of lower back 10/16/2021   Hypertension 10/16/2021   Tendinitis of both rotator cuffs 09/26/2020   Gout 07/29/2018   PAD (peripheral artery disease) (HCC) 01/29/2018   Erectile dysfunction associated with type 2 diabetes mellitus (HCC) 03/01/2016   Atherosclerosis of aorta (HCC) 03/30/2015   Hyperlipidemia associated with type 2 diabetes mellitus (HCC) 12/22/2014   Preop cardiovascular exam 12/08/2012   GERD (gastroesophageal reflux  disease) 07/08/2012   Diabetic polyradiculopathy associated with type 2 diabetes mellitus (HCC) 04/30/2012   Type 2 diabetes mellitus with diabetic neuropathy (HCC) 12/17/2001   PCP:  Patient, No Pcp Per Pharmacy:   [facility] [phone] - [place], [place] - [address] AT [facility] [address] [place] [address] Phone: [phone] Fax: [phone]  [id number] [place], [place] - [address] AT [facility] [address] [place] [address] Phone: [phone] Fax: [phone]  [facility] - [place], [place] - [address] [place] [address] Phone: [phone] Fax: [phone]     Social Determinants of Health (SDOH) Social History: SDOH Screenings   Food Insecurity: No Food Insecurity (11/15/2023)  Housing: Low Risk  (11/15/2023)  Transportation Needs: No Transportation Needs (11/15/2023)  Utilities: Not At Risk (11/15/2023)  Alcohol Screen: Low Risk  (02/05/2019)  Depression (PHQ2-9): Low Risk  (06/11/2022)  Tobacco Use: High Risk (11/13/2023)   SDOH Interventions:     Readmission Risk Interventions     No data to display

## 2023-11-15 NOTE — Progress Notes (Addendum)
[facility] DAILY ICU PROGRESS NOTE                   [address]            [place] [address]          [phone]   2 Days Post-Op Procedure(s) (LRB): CORONARY ARTERY BYPASS GRAFTING X 3, USING LEFT INTERNAL MAMMARY ARTERY AND ENDOSCOPICALLY HARVESTED RIGHT SAPHENOUS VEIN GRAFT (N/A) TRANSESOPHAGEAL ECHOCARDIOGRAM (N/A)  Total Length of Stay:  LOS: 2 days   Subjective: Awake and alert, Pain controlled.  Walked in the hall this morning and spent several hours in the chair.    Objective: Vital signs in last 24 hours: Temp:  [97.7 F (36.5 C)-99 F (37.2 C)] 97.7 F (36.5 C) (11/29 0500) Pulse Rate:  [71-91] 85 (11/29 0600) Cardiac Rhythm: Normal sinus rhythm (11/29 0600) Resp:  [16-33] 22 (11/29 0600) BP: (107-147)/(59-114) 126/84 (11/29 0600) SpO2:  [85 %-99 %] 94 % (11/29 0600) Arterial Line BP: (144-174)/(59-86) 174/68 (11/28 0930) Weight:  [86.6 kg] 86.6 kg (11/29 0410)  Filed Weights   11/13/23 0546 11/14/23 0600 11/15/23 0410  Weight: 85.3 kg 88.6 kg 86.6 kg    Weight change: -2 kg   Hemodynamic parameters for last 24 hours: CVP:  [11 mmHg-20 mmHg] 17 mmHg CO:  [5.8 L/min-7 L/min] 7 L/min CI:  [2.9 L/min/m2-3.4 L/min/m2] 3.4 L/min/m2  Intake/Output from previous day: 11/28 0701 - 11/29 0700 In: 1222 [P.O.:1105; I.V.:17.1; IV Piggyback:99.9] Out: 2508 [Urine:2428; Chest Tube:80]  Intake/Output this shift: No intake/output data recorded.  Current Meds: Scheduled Meds:  acetaminophen  1,000 mg Oral Q6H   Or   acetaminophen (TYLENOL) oral liquid 160 mg/5 mL  1,000 mg Per Tube Q6H   aspirin EC  325 mg Oral Daily   Or   aspirin  324 mg Per Tube Daily   atorvastatin  80 mg Oral q1800   bisacodyl  10 mg Oral Daily   Or   bisacodyl  10 mg Rectal Daily   Chlorhexidine Gluconate Cloth  6 each Topical Daily   docusate sodium  200 mg Oral Daily   insulin aspart  0-24 Units Subcutaneous Q4H   metoprolol tartrate  12.5 mg Oral BID   Or    metoprolol tartrate  12.5 mg Per Tube BID   mouth rinse  15 mL Mouth Rinse 4 times per day   pantoprazole  40 mg Oral Daily   sodium chloride flush  10 mL Intravenous Q12H   sodium chloride flush  3 mL Intravenous Q12H   Continuous Infusions:  albumin human Stopped (11/13/23 1425)   PRN Meds:.albumin human, dextrose, metoprolol tartrate, midazolam, morphine injection, ondansetron (ZOFRAN) IV, mouth rinse, mouth rinse, oxyCODONE, sodium chloride flush, traMADol  General appearance: alert, cooperative, and no distress Neurologic: intact Heart: RRR, no arrhythmias Lungs: breath sounds coarse, minimal chest tube drainage Abdomen: firm but not tender Extremities: all cool, no peripheral edema. Wound: the sternal incision is covered with a dry Aquacel dressing, RLE incision is dry.   Lab Results: CBC: Recent Labs    11/14/23 0410 11/15/23 0451  WBC 12.1* 15.4*  HGB 11.2* 10.5*  HCT 36.2* 34.5*  PLT 197 187   BMET:  Recent Labs    11/14/23 0410 11/15/23 0451  NA 130* 132*  K 4.2 4.3  CL 104 101  CO2 21* 25  GLUCOSE 94 184*  BUN 9 10  CREATININE 1.08 1.19  CALCIUM 8.3* 9.0    CMET: Lab  Results  Component Value Date   WBC 15.4 (H) 11/15/2023   HGB 10.5 (L) 11/15/2023   HCT 34.5 (L) 11/15/2023   PLT 187 11/15/2023   GLUCOSE 184 (H) 11/15/2023   CHOL 184 02/26/2022   TRIG 203 (H) 02/26/2022   HDL 48 02/26/2022   LDLCALC 101 (H) 02/26/2022   ALT 17 11/13/2023   AST 33 11/13/2023   NA 132 (L) 11/15/2023   K 4.3 11/15/2023   CL 101 11/15/2023   CREATININE 1.19 11/15/2023   BUN 10 11/15/2023   CO2 25 11/15/2023   TSH 0.927 06/03/2019   INR 1.2 11/13/2023   HGBA1C 8.7 (H) 11/11/2023   MICROALBUR 0.4 10/22/2014      PT/INR:  Recent Labs    11/13/23 1227  LABPROT 15.7*  INR 1.2   Radiology: VAS [place] UPPER EXTREMITY ARTERIAL DUPLEX  Result Date: 11/14/2023  UPPER EXTREMITY DUPLEX STUDY Patient Name:  Donald Palmer  Date of Exam:   11/14/2023 Medical Rec #:  [id number]    Accession #:    [id number] Date of Birth: 31-Dec-1960    Patient Gender: M Patient Age:   62 years Exam Location:  [facility] Procedure:      VAS [place] UPPER EXTREMITY ARTERIAL DUPLEX Referring Phys: Karie Fetch --------------------------------------------------------------------------------  Indications: Digital ischemia distal to arterial line. History:     Patient has a history of S/P CABG x 3 (11/13/2023).  Risk Factors:  Hypertension, hyperlipidemia, Diabetes, current smoker, coronary                artery disease. Other Factors: PAD Limitations: IV placement Comparison Study: No previous exams Performing Technologist: Jody Hill RVT, RDMS  Examination Guidelines: A complete evaluation includes B-mode imaging, spectral Doppler, color Doppler, and power Doppler as needed of all accessible portions of each vessel. Bilateral testing is considered an integral part of a complete examination. Limited examinations for reoccurring indications may be performed as noted.  Left Doppler Findings: +---------------+----------+---------+--------+-------------------------------+ Site           PSV (cm/s)Waveform StenosisComments                        +---------------+----------+---------+--------+-------------------------------+ Subclavian Prox120       triphasic                                        +---------------+----------+---------+--------+-------------------------------+ Subclavian Mid 70        biphasic                                         +---------------+----------+---------+--------+-------------------------------+ Subclavian Dist47        biphasic                                         +---------------+----------+---------+--------+-------------------------------+ Axillary       60        biphasic                                         +---------------+----------+---------+--------+-------------------------------+ Brachial Prox  81        triphasic                                         +---------------+----------+---------+--------+-------------------------------+  Brachial Mid   62        triphasic                                        +---------------+----------+---------+--------+-------------------------------+ Brachial Dist  63        biphasic                                         +---------------+----------+---------+--------+-------------------------------+ Radial Prox                               Unable to insonate - IV                                                   placement                       +---------------+----------+---------+--------+-------------------------------+ Radial Mid     52        triphasic                                        +---------------+----------+---------+--------+-------------------------------+ Radial Dist    59        biphasic                                         +---------------+----------+---------+--------+-------------------------------+ Ulnar Prox                                Unable to insonate - IV                                                   placement                       +---------------+----------+---------+--------+-------------------------------+ Ulnar Mid      58        biphasic                                         +---------------+----------+---------+--------+-------------------------------+ Ulnar Dist     64        biphasic                                         +---------------+----------+---------+--------+-------------------------------+ Palmar Arch    23        biphasic                                         +---------------+----------+---------+--------+-------------------------------+  Summary:  Left: No obstruction visualized in the left upper extremity. *See table(s) above for measurements and observations. Electronically signed by Sherald Hess MD on 11/14/2023 at 4:01:06 PM.    Final       Assessment/Plan: S/P Procedure(s) (LRB): CORONARY ARTERY BYPASS GRAFTING X 3, USING LEFT INTERNAL MAMMARY ARTERY AND ENDOSCOPICALLY HARVESTED RIGHT SAPHENOUS VEIN GRAFT (N/A) TRANSESOPHAGEAL ECHOCARDIOGRAM (N/A)  -POD2 CABG x 3 for 3V CAD discovered while being evaluated for LLE vascular surgery.   Stable VS and cardiac rhythm. On ASA, atorvastatin, low-dose metoprolol.  Minimal chest tube drainage. Progressing with mobility.   -PULM- on RA with adequate saturations. Coughing with some thick sputum production. Add Mucinex, encouraging work on [employer].   -ENDO- type 2 DM, A1C 8.7. CBGs controlled. Continue SSI.  -GI- tolerating PO's, passing gas, no BM yet. Abd is benign on exam.   -Peripheral vascular disease- Left SFA endarterectomy and fem-pop planned.  No acute ischemia. Will resume his Plavix.   -Renal- normal function, diuresing. Near pre-op Wt.   -Heme- mild expected acute blood loss anemia, monitoring.   -Disposition- Progressing well post CABG. D/C chest tubes and the right internal jugular line. Transfer to 4E. Advance activity.    Leary Roca, PA-C 11/15/2023 7:54 AM  Agree with above Doing well Titrating BP meds Will start home plavix Will transfer to floor

## 2023-11-16 LAB — BASIC METABOLIC PANEL
Anion gap: 6 (ref 5–15)
BUN: 11 mg/dL (ref 8–23)
CO2: 25 mmol/L (ref 22–32)
Calcium: 9.4 mg/dL (ref 8.9–10.3)
Chloride: 103 mmol/L (ref 98–111)
Creatinine, Ser: 1 mg/dL (ref 0.61–1.24)
GFR, Estimated: >60 mL/min (ref >60)
Glucose, Bld: 124 mg/dL — ABNORMAL HIGH (ref 70–99)
Potassium: 3.7 mmol/L (ref 3.5–5.1)
Sodium: 134 mmol/L — ABNORMAL LOW (ref 135–145)

## 2023-11-16 LAB — GLUCOSE, CAPILLARY
Glucose-Capillary: 125 mg/dL — ABNORMAL HIGH (ref 70–99)
Glucose-Capillary: 140 mg/dL — ABNORMAL HIGH (ref 70–99)
Glucose-Capillary: 105 mg/dL — ABNORMAL HIGH (ref 70–99)
Glucose-Capillary: 214 mg/dL — ABNORMAL HIGH (ref 70–99)
Glucose-Capillary: 135 mg/dL — ABNORMAL HIGH (ref 70–99)
Glucose-Capillary: 182 mg/dL — ABNORMAL HIGH (ref 70–99)

## 2023-11-16 LAB — CBC
HCT: 33.1 % — ABNORMAL LOW (ref 39.0–52.0)
Hemoglobin: 10.4 g/dL — ABNORMAL LOW (ref 13.0–17.0)
MCH: 22.2 pg — ABNORMAL LOW (ref 26.0–34.0)
MCHC: 31.4 g/dL (ref 30.0–36.0)
MCV: 70.6 fL — ABNORMAL LOW (ref 80.0–100.0)
Platelets: 187 10*3/uL (ref 150–400)
RBC: 4.69 MIL/uL (ref 4.22–5.81)
RDW: 15.2 % (ref 11.5–15.5)
WBC: 11.9 10*3/uL — ABNORMAL HIGH (ref 4.0–10.5)
nRBC: 0 % (ref 0.0–0.2)

## 2023-11-16 MED ORDER — LACTULOSE 10 GM/15ML PO SOLN: 20.0000 g | Freq: Every day | ORAL | Status: DC | PRN

## 2023-11-16 MED ORDER — INSULIN ASPART 100 UNIT/ML IJ SOLN
0.0000 [IU] | Freq: Three times a day (TID) | INTRAMUSCULAR | Status: DC
Start: 2023-11-16 — End: 2023-11-18
  Administered 2023-11-16: 5 [IU] via SUBCUTANEOUS
  Administered 2023-11-16: 2 [IU] via SUBCUTANEOUS
  Administered 2023-11-17: 3 [IU] via SUBCUTANEOUS
  Administered 2023-11-17: 5 [IU] via SUBCUTANEOUS
  Administered 2023-11-17 – 2023-11-18 (×2): 3 [IU] via SUBCUTANEOUS

## 2023-11-16 NOTE — Plan of Care (Signed)
Educated patient on the lifting weight limit and the importance of walking throughout the day. Patient more receptive of getting out of the bed and ambulating. Problem: Education: Goal: Knowledge of General Education information will improve Description: Including pain rating scale, medication(s)/side effects and non-pharmacologic comfort measures Outcome: Progressing   Problem: Health Behavior/Discharge Planning: Goal: Ability to manage health-related needs will improve Outcome: Progressing   Problem: Activity: Goal: Risk for activity intolerance will decrease Outcome: Progressing

## 2023-11-16 NOTE — Progress Notes (Addendum)
[address]       [place] [address]             [phone]      3 Days Post-Op Procedure(s) (LRB): CORONARY ARTERY BYPASS GRAFTING X 3, USING LEFT INTERNAL MAMMARY ARTERY AND ENDOSCOPICALLY HARVESTED RIGHT SAPHENOUS VEIN GRAFT (N/A) TRANSESOPHAGEAL ECHOCARDIOGRAM (N/A) Subjective: Patient states he has no pain. No complaints this AM. Walking around his room independently.  Objective: Vital signs in last 24 hours: Temp:  [98 F (36.7 C)-99.1 F (37.3 C)] 99.1 F (37.3 C) (11/30 0747) Pulse Rate:  [82-113] 106 (11/30 0747) Cardiac Rhythm: Normal sinus rhythm;Sinus tachycardia (11/29 2230) Resp:  [11-32] 15 (11/30 0747) BP: (104-164)/(60-84) 104/73 (11/30 0747) SpO2:  [90 %-100 %] 95 % (11/30 0747) Weight:  [83.2 kg] 83.2 kg (11/30 0439)  Hemodynamic parameters for last 24 hours:    Intake/Output from previous day: 11/29 0701 - 11/30 0700 In: 0  Out: 2170 [Urine:2150; Chest Tube:20] Intake/Output this shift: No intake/output data recorded.  General appearance: alert, cooperative, and no distress Neurologic: intact Heart: sinus tachycardia, no murmur Lungs: Coarse breath sounds worse on the left Abdomen: soft, non-tender; bowel sounds normal; no masses,  no organomegaly Extremities: extremities normal, atraumatic, no cyanosis or edema Wound: Clean and dry without sign of infection  Lab Results: Recent Labs    11/15/23 0451 11/16/23 0250  WBC 15.4* 11.9*  HGB 10.5* 10.4*  HCT 34.5* 33.1*  PLT 187 187   BMET:  Recent Labs    11/15/23 0451 11/16/23 0250  NA 132* 134*  K 4.3 3.7  CL 101 103  CO2 25 25  GLUCOSE 184* 124*  BUN 10 11  CREATININE 1.19 1.00  CALCIUM 9.0 9.4    PT/INR:  Recent Labs    11/13/23 1227  LABPROT 15.7*  INR 1.2   ABG    Component Value Date/Time   PHART 7.301 (L) 11/13/2023 1550   HCO3 19.6 (L) 11/13/2023 1550   TCO2 21 (L) 11/13/2023 1550   ACIDBASEDEF 6.0 (H) 11/13/2023 1550   O2SAT 92  11/13/2023 1550   CBG (last 3)  Recent Labs    11/15/23 2347 11/16/23 0354 11/16/23 0749  GLUCAP 143* 125* 140*    Assessment/Plan: S/P Procedure(s) (LRB): CORONARY ARTERY BYPASS GRAFTING X 3, USING LEFT INTERNAL MAMMARY ARTERY AND ENDOSCOPICALLY HARVESTED RIGHT SAPHENOUS VEIN GRAFT (N/A) TRANSESOPHAGEAL ECHOCARDIOGRAM (N/A)  Neuro: Pain controlled  CV: BP controlled on Lopressor 25mg  BID. SBP 104 this AM.  NSR-ST, HR 105 this AM. BP restrictive of titrating BB.  Pulm: Saturating well on 2L [place]. Coarse breath sounds. CXR with bibasilar atelectasis, interstitial edema resolved. Encourage IS and ambulation. Add flutter valve. Wean oxygen as tolerated for O2>90%.  GI: Tolerating a diet but does not like the food. -BM. Add lactulose PRN.   Endo: T2DM, preop A1C 8.7. Takes Evaristo Bury 20U daily and Actos 30mg  daily. CBGs controlled on SSI. Will restart home meds at discharge.   Renal: Cr 1.00, trending down. K 3.7 supplement. Good UO. Under preop weight. Will d/c Lasix  ID: Likely reactive leukocytosis trending down, WBC 11.9. Tmax 99.1, monitor.   Expected postop ABLA: H/H stable, 10.4/33.1, not clinically significant at this time  DVT Prophylaxis: Lovenox  Mild hyponatremia: Trending up  Peripheral vascular disease: left SFA endarterectomy and fem-pop planned, on home Plavix. Followed by vascular surgery.  Dispo: Dispo planning, weaning oxygen. Hopefully can d/c in the next few days.   LOS: 3 days  Jenny Reichmann, PA-C 11/16/2023   Agree with above Titrating meds IS, ambulation Dispo planning  Michaeljohn Biss O Sher Shampine

## 2023-11-16 NOTE — Plan of Care (Signed)
  Problem: Clinical Measurements: Goal: Respiratory complications will improve Outcome: Progressing   

## 2023-11-16 NOTE — Progress Notes (Signed)
Mobility Specialist Progress Note:   11/16/23 1609  Mobility  Activity Ambulated with assistance in hallway  Level of Assistance  (MinG)  Assistive Device Front wheel walker  Distance Ambulated (ft) 390 ft  RUE Weight Bearing NWB  LUE Weight Bearing NWB  Activity Response Tolerated well  Mobility Referral Yes  $Mobility charge 1 Mobility  Mobility Specialist Start Time (ACUTE ONLY) 1550  Mobility Specialist Stop Time (ACUTE ONLY) 1600  Mobility Specialist Time Calculation (min) (ACUTE ONLY) 10 min   During Mobility: 104 HR ,  94% SpO2 3 L Post Mobility: 92 HR , 95% SpO2 3 L  Pt received in chair, agreeable to mobility. Pt needing 3x attempts to stand independently within sternal precautions. MinG during ambulation. Pt c/o R side pain, otherwise asx throughout. VSS. Pt returned to bed with call bell in reach and all needs met.    Leory Plowman  Mobility Specialist Please contact via [employer] office at [phone]

## 2023-11-17 LAB — GLUCOSE, CAPILLARY
Glucose-Capillary: 157 mg/dL — ABNORMAL HIGH (ref 70–99)
Glucose-Capillary: 229 mg/dL — ABNORMAL HIGH (ref 70–99)
Glucose-Capillary: 151 mg/dL — ABNORMAL HIGH (ref 70–99)
Glucose-Capillary: 226 mg/dL — ABNORMAL HIGH (ref 70–99)

## 2023-11-17 LAB — BASIC METABOLIC PANEL
Anion gap: 7 (ref 5–15)
BUN: 13 mg/dL (ref 8–23)
CO2: 25 mmol/L (ref 22–32)
Calcium: 9.1 mg/dL (ref 8.9–10.3)
Chloride: 102 mmol/L (ref 98–111)
Creatinine, Ser: 1.07 mg/dL (ref 0.61–1.24)
GFR, Estimated: >60 mL/min (ref >60)
Glucose, Bld: 143 mg/dL — ABNORMAL HIGH (ref 70–99)
Potassium: 4 mmol/L (ref 3.5–5.1)
Sodium: 134 mmol/L — ABNORMAL LOW (ref 135–145)

## 2023-11-17 MED ORDER — APIXABAN 5 MG PO TABS: 5.0000 mg | ORAL_TABLET | Freq: Every day | ORAL | Status: DC

## 2023-11-17 MED ORDER — LACTULOSE 10 GM/15ML PO SOLN
20.0000 g | Freq: Once | ORAL | Status: AC
  Administered 2023-11-17: 20 g via ORAL
  Filled 2023-11-17: qty 30

## 2023-11-17 MED ORDER — APIXABAN 5 MG PO TABS
5.0000 mg | ORAL_TABLET | Freq: Two times a day (BID) | ORAL | Status: DC
  Administered 2023-11-17 – 2023-11-18 (×3): 5 mg via ORAL
  Filled 2023-11-17 (×3): qty 1

## 2023-11-17 NOTE — Progress Notes (Signed)
Mobility Specialist Progress Note:   11/17/23 1304  Mobility  Activity Ambulated with assistance in hallway  Level of Assistance  (MinG)  Assistive Device Front wheel walker  Distance Ambulated (ft) 400 ft  RUE Weight Bearing NWB  LUE Weight Bearing NWB  Activity Response Tolerated well  Mobility Referral Yes  $Mobility charge 1 Mobility  Mobility Specialist Start Time (ACUTE ONLY) 1115  Mobility Specialist Stop Time (ACUTE ONLY) 1130  Mobility Specialist Time Calculation (min) (ACUTE ONLY) 15 min   Pre Mobility: 87 HR  During Mobility: 96 HR , 99% SpO2 RA  Pt received in bed, agreeable to mobility. C/o slight chest pain, otherwise asx throughout. RN notified. Pt left in chair with call bell in reach and all needs met.  Leory Plowman  Mobility Specialist Please contact via [employer] office at [phone]

## 2023-11-17 NOTE — Progress Notes (Addendum)
[address]       [place] [address]             [phone]      4 Days Post-Op Procedure(s) (LRB): CORONARY ARTERY BYPASS GRAFTING X 3, USING LEFT INTERNAL MAMMARY ARTERY AND ENDOSCOPICALLY HARVESTED RIGHT SAPHENOUS VEIN GRAFT (N/A) TRANSESOPHAGEAL ECHOCARDIOGRAM (N/A) Subjective: The patient's main complaints is pain in his left 2nd and 3rd toe. He states this is normal for him but it used to come and go, now it is constant.   Objective: Vital signs in last 24 hours: Temp:  [98.2 F (36.8 C)-99.1 F (37.3 C)] 98.2 F (36.8 C) (12/01 0419) Pulse Rate:  [84-106] 84 (12/01 0419) Cardiac Rhythm: Normal sinus rhythm;Bundle branch block (11/30 2000) Resp:  [15-25] 20 (12/01 0419) BP: (95-121)/(54-77) 121/77 (12/01 0419) SpO2:  [92 %-98 %] 96 % (12/01 0419)  Hemodynamic parameters for last 24 hours:    Intake/Output from previous day: 11/30 0701 - 12/01 0700 In: 733 [P.O.:720; I.V.:13] Out: 1575 [Urine:1575] Intake/Output this shift: No intake/output data recorded.  General appearance: alert, cooperative, and no distress Neurologic: intact Heart: regular rate and rhythm, S1, S2 normal, no murmur, click, rub or gallop Lungs: Diminished breath sounds bibasilar, coarse left sided breath sounds Abdomen: soft, non-tender; bowel sounds normal; no masses,  no organomegaly Extremities: no edema, left 2nd and 3rd toes are tender to palpation, left toes are cool to the touch, +DP pulse on doppler Wound: Sternal and EVH site are clean and dry without sign of infection  Lab Results: Recent Labs    11/15/23 0451 11/16/23 0250  WBC 15.4* 11.9*  HGB 10.5* 10.4*  HCT 34.5* 33.1*  PLT 187 187   BMET:  Recent Labs    11/16/23 0250 11/17/23 0336  NA 134* 134*  K 3.7 4.0  CL 103 102  CO2 25 25  GLUCOSE 124* 143*  BUN 11 13  CREATININE 1.00 1.07  CALCIUM 9.4 9.1    PT/INR: No results for input(s): "LABPROT", "INR" in the last 72 hours. ABG     Component Value Date/Time   PHART 7.301 (L) 11/13/2023 1550   HCO3 19.6 (L) 11/13/2023 1550   TCO2 21 (L) 11/13/2023 1550   ACIDBASEDEF 6.0 (H) 11/13/2023 1550   O2SAT 92 11/13/2023 1550   CBG (last 3)  Recent Labs    11/16/23 1607 11/16/23 2104 11/17/23 0640  GLUCAP 135* 182* 157*    Assessment/Plan: S/P Procedure(s) (LRB): CORONARY ARTERY BYPASS GRAFTING X 3, USING LEFT INTERNAL MAMMARY ARTERY AND ENDOSCOPICALLY HARVESTED RIGHT SAPHENOUS VEIN GRAFT (N/A) TRANSESOPHAGEAL ECHOCARDIOGRAM (N/A)  Neuro: Sternal pain controlled. Current pain is in his left 2nd and 3rd toes. He admits this is normal for him but it usually comes and goes and now it is constant. +DP pulse with doppler. Continue current pain regimen.    CV: BP controlled on Lopressor 25mg  BID. SBP 95-121. NSR, HR 84 this AM. May need to decrease Lopressor for improved extremity perfusion?   Pulm: Saturating well on RA this AM. Hx of smoking. CXR with bibasilar atelectasis. Encourage IS and ambulation.    GI: Tolerating a diet but does not like the food. -BM. Give lactulose today   Endo: T2DM, preop A1C 8.7. Takes Evaristo Bury 20U daily and Actos 30mg  daily. CBGs overall controlled on SSI. Will restart home meds at discharge.    Renal: Cr 1.07. K 4.0. Good UO. Under preop weight.    ID: Likely reactive leukocytosis trending  down, WBC 11.9. Tmax 98.9   DVT Prophylaxis: Lovenox   Peripheral vascular disease: left SFA endarterectomy and fem-pop planned, on home Plavix. Followed by vascular surgery. Left toes cool to touch. +DP pulse on left with doppler. Was on Plavix and Eliquis prior to surgery. Will discuss with Dr. Cliffton Asters timing of restarting Eliquis.   Deconditioning: Patient ambulates well but needs multiple reminders on sternal precautions    Dispo: Possible d/c tomorrow after restarting Eliquis   LOS: 4 days    Jenny Reichmann, PA-C 11/17/2023   Agree with above Dispo planning  Mirayah Wren O  Judine Arciniega

## 2023-11-17 NOTE — Plan of Care (Signed)
  Problem: Pain Management: Goal: General experience of comfort will improve Outcome: Progressing   Problem: Skin Integrity: Goal: Wound healing without signs and symptoms of infection Outcome: Progressing   Problem: Coping: Goal: Ability to adjust to condition or change in health will improve Outcome: Progressing

## 2023-11-18 LAB — GLUCOSE, CAPILLARY: Glucose-Capillary: 189 mg/dL — ABNORMAL HIGH (ref 70–99)

## 2023-11-18 MED ORDER — ACETAMINOPHEN 325 MG PO TABS: 650.0000 mg | ORAL_TABLET | Freq: Four times a day (QID) | ORAL | Status: DC

## 2023-11-18 MED ORDER — OXYCODONE HCL 5 MG PO TABS: 5.0000 mg | ORAL_TABLET | Freq: Four times a day (QID) | ORAL | 0 refills | Status: DC | PRN

## 2023-11-18 MED ORDER — METOPROLOL TARTRATE 25 MG PO TABS: 25.0000 mg | ORAL_TABLET | Freq: Two times a day (BID) | ORAL | 2 refills | Status: DC

## 2023-11-18 MED ORDER — APIXABAN 5 MG PO TABS: 5.0000 mg | ORAL_TABLET | Freq: Two times a day (BID) | ORAL | 1 refills | Status: DC

## 2023-11-18 NOTE — Progress Notes (Signed)
CARDIAC REHAB PHASE I   CARDIAC REHAB PHASE I     Post [facility] education including site care, restrictions, heart healthy diabetic diet, sternal precautions, IS use at home, home needs at discharge, exercise guidelines and CRP2 reviewed via phone. All questions and concerns addressed. Will refer to [facility] for CRP2.   Woodroe Chen, RN BSN 11/18/2023 9:18 AM

## 2023-11-18 NOTE — Progress Notes (Signed)
5 Days Post-Op Procedure(s) (LRB): CORONARY ARTERY BYPASS GRAFTING X 3, USING LEFT INTERNAL MAMMARY ARTERY AND ENDOSCOPICALLY HARVESTED RIGHT SAPHENOUS VEIN GRAFT (N/A) TRANSESOPHAGEAL ECHOCARDIOGRAM (N/A) Subjective: Feels well, no change in left foot  Objective: Vital signs in last 24 hours: Temp:  [98.3 F (36.8 C)-98.5 F (36.9 C)] 98.5 F (36.9 C) (12/02 0354) Pulse Rate:  [85-96] 87 (12/01 2317) Cardiac Rhythm: Normal sinus rhythm (12/02 0400) Resp:  [17-21] 17 (12/02 0354) BP: (115-134)/(75-88) 131/80 (12/02 0354) SpO2:  [91 %-99 %] 94 % (12/02 0354) Weight:  [81 kg] 81 kg (12/02 0549)  Hemodynamic parameters for last 24 hours:    Intake/Output from previous day: No intake/output data recorded. Intake/Output this shift: No intake/output data recorded.  General appearance: alert, cooperative, and no distress Heart: regular rate and rhythm Lungs: dim left base Abdomen: benign Extremities: no edema, warm Wound: incis healing well  Lab Results: Recent Labs    11/16/23 0250  WBC 11.9*  HGB 10.4*  HCT 33.1*  PLT 187   BMET:  Recent Labs    11/16/23 0250 11/17/23 0336  NA 134* 134*  K 3.7 4.0  CL 103 102  CO2 25 25  GLUCOSE 124* 143*  BUN 11 13  CREATININE 1.00 1.07  CALCIUM 9.4 9.1    PT/INR: No results for input(s): "LABPROT", "INR" in the last 72 hours. ABG    Component Value Date/Time   PHART 7.301 (L) 11/13/2023 1550   HCO3 19.6 (L) 11/13/2023 1550   TCO2 21 (L) 11/13/2023 1550   ACIDBASEDEF 6.0 (H) 11/13/2023 1550   O2SAT 92 11/13/2023 1550   CBG (last 3)  Recent Labs    11/17/23 1731 11/17/23 2112 11/18/23 0548  GLUCAP 151* 226* 189*    Meds Scheduled Meds:  acetaminophen  1,000 mg Oral Q6H   Or   acetaminophen (TYLENOL) oral liquid 160 mg/5 mL  1,000 mg Per Tube Q6H   apixaban  5 mg Oral BID   aspirin EC  81 mg Oral Daily   atorvastatin  80 mg Oral q1800   bisacodyl  10 mg Oral Daily   Or   bisacodyl  10 mg Rectal Daily    Chlorhexidine Gluconate Cloth  6 each Topical Daily   clopidogrel  75 mg Oral Daily   docusate sodium  200 mg Oral Daily   insulin aspart  0-15 Units Subcutaneous TID WC   metoprolol tartrate  25 mg Oral BID   mouth rinse  15 mL Mouth Rinse 4 times per day   pantoprazole  40 mg Oral Daily   sodium chloride flush  10 mL Intravenous Q12H   sodium chloride flush  3 mL Intravenous Q12H   Continuous Infusions:  albumin human Stopped (11/13/23 1425)   PRN Meds:.albumin human, dextrose, metoprolol tartrate, ondansetron (ZOFRAN) IV, mouth rinse, mouth rinse, oxyCODONE, sodium chloride flush, traMADol  Xrays No results found.  Assessment/Plan: S/P Procedure(s) (LRB): CORONARY ARTERY BYPASS GRAFTING X 3, USING LEFT INTERNAL MAMMARY ARTERY AND ENDOSCOPICALLY HARVESTED RIGHT SAPHENOUS VEIN GRAFT (N/A) TRANSESOPHAGEAL ECHOCARDIOGRAM (N/A)  POD#5  1 afeb, VSS, sinus rhythm 2 O2 sats good on RA 3 voiding- unmeasured, weight below preop 4 BS - variable control, fairly poor control lover a number of years based on Hg A1c- most recent 8.7- will need close OP F/U- resume home meds 5 no new labs or Xrays today 6 eliquis has been resumed, also on plavix as previous 7 reviewed instructions- stable for d/c    LOS: 5 days  Rowe Clack PA-C Pager [phone] 11/18/2023

## 2023-11-19 MED FILL — Heparin Sodium (Porcine) Inj 1000 Unit/ML: Qty: 1000 | Status: AC

## 2023-11-19 MED FILL — Potassium Chloride Inj 2 mEq/ML: INTRAVENOUS | Qty: 40 | Status: AC

## 2023-11-19 MED FILL — Lidocaine HCl Local Preservative Free (PF) Inj 2%: INTRAMUSCULAR | Qty: 14 | Status: AC

## 2023-11-20 MED FILL — Electrolyte-R (PH 7.4) Solution: INTRAVENOUS | Qty: 3000 | Status: AC

## 2023-11-20 MED FILL — Heparin Sodium (Porcine) Inj 1000 Unit/ML: INTRAMUSCULAR | Qty: 10 | Status: AC

## 2023-11-20 MED FILL — Sodium Bicarbonate IV Soln 8.4%: INTRAVENOUS | Qty: 50 | Status: AC

## 2023-11-20 MED FILL — Mannitol IV Soln 20%: INTRAVENOUS | Qty: 500 | Status: AC

## 2023-11-20 MED FILL — Calcium Chloride Inj 10%: INTRAVENOUS | Qty: 10 | Status: AC

## 2023-11-20 MED FILL — Sodium Chloride IV Soln 0.9%: INTRAVENOUS | Qty: 1000 | Status: AC

## 2023-11-21 ENCOUNTER — Telehealth: Payer: Self-pay

## 2023-11-21 ENCOUNTER — Telehealth: Payer: Self-pay | Admitting: Vascular Surgery

## 2023-11-21 NOTE — Telephone Encounter (Signed)
Message sent to scheduling to arrange appointment with Dr. Randie Heinz to discuss surgery after patients scheduled cardiology appointment on 12/16.

## 2023-11-21 NOTE — Telephone Encounter (Signed)
-----   Message from [name] sent at 11/21/2023 10:44 AM EST ----- Regarding: RE: Please advise I should probably see him.   Apolinar Junes ----- Message ----- From: Primitivo Gauze, RN Sent: 11/21/2023  10:21 AM EST To: Maeola Harman, MD Subject: Please advise                                  [facility]  Patient needed clearance for left common femoral-popliteal artery bypass. He had a 3V-CABG on 11/27 and will f/u with [facility] on 12/13 and cardiology 12/16. Do you need to see him again prior to scheduling or OK to schedule once cleared?   Georgianne Fick

## 2023-11-25 ENCOUNTER — Ambulatory Visit: Payer: Medicare HMO | Admitting: Adult Health

## 2023-11-25 ENCOUNTER — Telehealth (HOSPITAL_COMMUNITY): Payer: Self-pay

## 2023-11-25 NOTE — Telephone Encounter (Signed)
Per phase 1 Cardiac Rehab fax referral to [facility].

## 2023-11-27 NOTE — Progress Notes (Signed)
Cardiology Office Note:  .   Date:  12/02/2023  ID:  Donald Palmer, DOB 1961/08/18, MRN [id number] PCP: Elder Negus, MD  [facility] Providers Cardiologist: [place] }   History of Present Illness: .   Donald Palmer is a 62 y.o. male with hx of HTN, Dyslipidemia, Gout, CAD s/p CABG X #3, PAD previously underwent injection atherectomy and DCB PTCA to left popliteal artery for symptoms of claudication. More recently, he underwent peripheral angiography by Dr. Randie Heinz that showed 50% left common femoral artery disease, followed by occlusion of left SFA within prior popliteal stent, with reconstitution of three-vessel runoff just above the knee level. Seen during hospitalization for pre-operative evaluation to have femoral above the knee popliteal artery bypass.Eliquis was stopped. He was scheduled for CABG on 11/13/2023 due to severe multivessel CAD, He had LIMA to LAD, RSVG PDA, OM with Dr. Cliffton Asters.  Here for post hospital follow up.   He comes today complaining of continued substernal pain.  He is frustrated that he is unable to be as active as he used to be working on his cars and walking.  The patient has been pushing through a good bit trying to do is much as he could on his own which is frustrating for him and for his wife causing some conflict.  The patient is requesting refills on pain medication.  He is using incentive spirometry, walking around in his home, and is medically compliant.  He denies any bleeding issues on apixaban and clopidogrel.  He remains on metoprolol 25 mg twice a day without feeling his heart racing or being irregular.  He does have occasional shortness of breath with activity.  He states if he talks more than 20 minutes he becomes short of breath and has to stop and rest.  He is anxious to move forward with lower extremity stenting of the left leg as he continues to have symptoms of intermittent claudication and burning pain.  He has not yet followed up with  Dr. Randie Heinz concerning planning this procedure since his CABG.   Studies Reviewed: Marland Kitchen   EKG Interpretation Date/Time:  Monday December 02 2023 13:59:42 EST Ventricular Rate:  83 PR Interval:  186 QRS Duration:  86 QT Interval:  378 QTC Calculation: 444 R Axis:   -22  Text Interpretation: Normal sinus rhythm Inferior infarct (cited on or before 09-Sep-2023) Anterolateral infarct , age undetermined When compared with ECG of 14-Nov-2023 06:50, Anterolateral infarct is now Present ST elevation has replaced ST depression in Inferior leads ST no longer elevated in Lateral leads T wave inversion no longer evident in Inferior leads Confirmed by Joni Reining [phone]) on 12/02/2023 2:50:01 PM  LHC 10/16/2023 LM: Distal 40% stenosis of mild calcification LAD: Proximal-mid 80% calcific stenosis at trifurcation of LAD, diag, and septal perforator. Lcx: Ostial 80% stenosis before a high bifurcating OM1         OM2 proximal 80% disease RCA: Proximal focal 75% stenosis          Distal RCA occlusion          Left to right collaterals to distal RCA/PDA   LVEDP 16 mmHg LVEF 45-50% with severe inferior hypokinesis  Physical Exam:   VS:  BP 114/70 (BP Location: Left Arm, Patient Position: Sitting, Cuff Size: Normal)   Pulse 83   Ht 5\' 9"  (1.753 m)   Wt 179 lb 12.8 oz (81.6 kg)   SpO2 96%   BMI 26.55 kg/m    Wt  Readings from Last 3 Encounters:  12/02/23 179 lb 12.8 oz (81.6 kg)  11/18/23 178 lb 9.6 oz (81 kg)  11/11/23 185 lb 12.8 oz (84.3 kg)    GEN: Well nourished, well developed in no acute distress NECK: No JVD; No carotid bruits CARDIAC: RRR, distant heart sounds no murmurs, rubs, gallops RESPIRATORY:  Clear to auscultation without rales, wheezing or rhonchi  ABDOMEN: Soft, non-tender, non-distended EXTREMITIES:  No edema; No deformity SVG harvest site on right upper leg healing well. MUS: Sternotomy site is healing well without evidence of evisceration or infection.  Some pain to  palpation on the left pectoral area.  ASSESSMENT AND PLAN: .    CAD: Left heart catheterization revealing severe multivessel disease, left main LAD RCA and left circumflex.  He is now status post coronary artery bypass grafting x 4 on 11/13/2023.  He has not yet been released by Dr. Cliffton Asters and is continue to be followed by surgery at this time.  He has continued to have discomfort in his chest and substernal area and is requesting pain medication refills.  I have explained to him that surgeon would be the person to request pain medication postoperatively.  They verbalized understanding.  He is not yet ready for cardiovascular rehab as he has not been released by surgeon.  He is to continue to walk within his home and avoid strenuous activities.  I have explained to him not to overdo so that he will not back himself up and have to take longer to heal.  I have also explained that he can have some postoperative changes that will include irritability and depression which she is experiencing as he is unable to do his normal activities.  He is advised to take his time and increase his activity slowly and once released by Dr. Cliffton Asters can move forward with cardiac rehab.  For now continue his current medication regimen without changes.  2.  Hypercholesterolemia: Goal of LDL is less than 50 in the setting of diabetes and CABG.Marland Kitchen  He remains on atorvastatin 80 mg daily.  He will need to have repeat fasting lipids and LFTs in 3 months if not completed by primary care.  It will be important for him to adhere to a low cholesterol low-sodium diet in order to keep his cardiovascular health and check and prevent any further issues.  He verbalizes understanding.  3.  Peripheral arterial disease: He is due to have left common femoral bypass by Dr. Randie Heinz due to abnormal peripheral angiography.  Will defer to Dr. Pascal Lux and Dr. Cliffton Asters concerning timing of this procedure post CABG.  For now he will continue his current  medication regimen         Signed, Bettey Mare. Liborio Nixon, ANP, AACC

## 2023-11-29 ENCOUNTER — Telehealth: Payer: Medicaid Other | Admitting: Thoracic Surgery (Cardiothoracic Vascular Surgery)

## 2023-12-02 ENCOUNTER — Encounter: Payer: Self-pay | Admitting: Adult Health

## 2023-12-02 ENCOUNTER — Ambulatory Visit: Payer: Medicare HMO | Attending: Adult Health | Admitting: Adult Health

## 2023-12-02 VITALS — BP 114/70 | HR 83 | Ht 69.0 in | Wt 179.8 lb

## 2023-12-02 DIAGNOSIS — I739 Peripheral vascular disease, unspecified: Secondary | ICD-10-CM

## 2023-12-02 DIAGNOSIS — I10 Essential (primary) hypertension: Secondary | ICD-10-CM

## 2023-12-02 DIAGNOSIS — I2510 Atherosclerotic heart disease of native coronary artery without angina pectoris: Secondary | ICD-10-CM | POA: Diagnosis not present

## 2023-12-02 DIAGNOSIS — E7800 Pure hypercholesterolemia, unspecified: Secondary | ICD-10-CM | POA: Diagnosis not present

## 2023-12-02 MED ORDER — METOPROLOL TARTRATE 25 MG PO TABS: 25.0000 mg | ORAL_TABLET | Freq: Two times a day (BID) | ORAL | 2 refills | Status: DC

## 2023-12-02 MED ORDER — APIXABAN 5 MG PO TABS: 5.0000 mg | ORAL_TABLET | Freq: Two times a day (BID) | ORAL | 1 refills | Status: DC

## 2023-12-02 MED ORDER — ATORVASTATIN CALCIUM 80 MG PO TABS: 80.0000 mg | ORAL_TABLET | Freq: Every day | ORAL | 3 refills | Status: DC

## 2023-12-02 MED ORDER — CLOPIDOGREL BISULFATE 75 MG PO TABS: 75.0000 mg | ORAL_TABLET | Freq: Every day | ORAL | 3 refills | Status: DC

## 2023-12-02 NOTE — Patient Instructions (Signed)
Medication Instructions:  No Changes *If you need a refill on your cardiac medications before your next appointment, please call your pharmacy*   Lab Work: BMET, CBC  If you have labs (blood work) drawn today and your tests are completely normal, you will receive your results only by: MyChart Message (if you have MyChart) OR A paper copy in the mail If you have any lab test that is abnormal or we need to change your treatment, we will call you to review the results.   Testing/Procedures: No Testing   Follow-Up: At [facility], you and your health needs are our priority.  As part of our continuing mission to provide you with exceptional heart care, we have created designated Provider Care Teams.  These Care Teams include your primary Cardiologist (physician) and Advanced Practice Providers (APPs -  Physician Assistants and Nurse Practitioners) who all work together to provide you with the care you need, when you need it.  We recommend signing up for the patient portal called "MyChart".  Sign up information is provided on this After Visit Summary.  MyChart is used to connect with patients for Virtual Visits (Telemedicine).  Patients are able to view lab/test results, encounter notes, upcoming appointments, etc.  Non-urgent messages can be sent to your provider as well.   To learn more about what you can do with MyChart, go to [email or web address].    Your next appointment:   1 month(s)  Provider:   Joni Reining     or ,Truett Mainland, MD.

## 2023-12-03 ENCOUNTER — Telehealth: Payer: Self-pay

## 2023-12-03 LAB — CBC
Hematocrit: 35.7 % — ABNORMAL LOW (ref 37.5–51.0)
Hemoglobin: 11 g/dL — ABNORMAL LOW (ref 13.0–17.7)
MCH: 21.3 pg — ABNORMAL LOW (ref 26.6–33.0)
MCHC: 30.8 g/dL — ABNORMAL LOW (ref 31.5–35.7)
MCV: 69 fL — ABNORMAL LOW (ref 79–97)
Platelets: 728 10*3/uL — ABNORMAL HIGH (ref 150–450)
RBC: 5.16 x10E6/uL (ref 4.14–5.80)
RDW: 13.7 % (ref 11.6–15.4)
WBC: 5.7 10*3/uL (ref 3.4–10.8)

## 2023-12-03 LAB — BASIC METABOLIC PANEL
BUN/Creatinine Ratio: 6 — ABNORMAL LOW (ref 10–24)
BUN: 7 mg/dL — ABNORMAL LOW (ref 8–27)
CO2: 22 mmol/L (ref 20–29)
Calcium: 9.9 mg/dL (ref 8.6–10.2)
Chloride: 103 mmol/L (ref 96–106)
Creatinine, Ser: 1.08 mg/dL (ref 0.76–1.27)
Glucose: 151 mg/dL — ABNORMAL HIGH (ref 70–99)
Potassium: 4.4 mmol/L (ref 3.5–5.2)
Sodium: 142 mmol/L (ref 134–144)
eGFR: 78 mL/min/{1.73_m2} (ref >59)

## 2023-12-03 NOTE — Telephone Encounter (Addendum)
Called patient regarding results. Unable to leave voicemail.----- Message from Joni Reining sent at 12/03/2023  7:51 AM EST ----- I have reviewed the labs. Glucose slightly elevated. Not done fasting. Hgb has improved. No evidence of infection. Platelet count is elevated.  Follow up with PCP. Otherwise the labs are okay. No changes in the regimen.

## 2023-12-04 NOTE — Telephone Encounter (Signed)
Pt appt scheduled

## 2023-12-05 ENCOUNTER — Other Ambulatory Visit: Payer: Self-pay | Admitting: Physician Assistant

## 2023-12-05 ENCOUNTER — Telehealth: Payer: Self-pay

## 2023-12-05 MED ORDER — OXYCODONE HCL 5 MG PO TABS: 5.0000 mg | ORAL_TABLET | Freq: Four times a day (QID) | ORAL | 0 refills | Status: DC | PRN

## 2023-12-05 NOTE — Telephone Encounter (Signed)
Patient contacted the office requesting refill of pain medication, Oxycodone. He is s/p CABG 11/27 with Dr. Cliffton Asters. He states that he is out of medication and has been out for 3 days. He is taking the pain medication every 6 hours and states he has also been taking Tylenol. States left area of his chest aches. He was very reluctant to give information of how often he took the medication and how many of the medication he had left. Advised that this is important information and regulations of how much/how often you can get pain medication. He acknowledged receipt.   Spoke with [place], PA whom prescribed patient Oxycodone 5 mg #24. Rx was sent into patient's preferred pharmacy, [facility]. Patient made aware of weaning off pain medication and only take when needed. He acknowledged receipt.

## 2023-12-06 ENCOUNTER — Telehealth: Payer: Medicaid Other | Admitting: Thoracic Surgery (Cardiothoracic Vascular Surgery)

## 2023-12-23 ENCOUNTER — Telehealth: Payer: Self-pay

## 2023-12-23 NOTE — Telephone Encounter (Addendum)
 Letter mailed to patient on 12/23/23 regarding results.----- Message from Donald Palmer sent at 12/03/2023  7:51 AM EST ----- I have reviewed the labs. Glucose slightly elevated. Not done fasting. Hgb has improved. No evidence of infection. Platelet count is elevated.  Follow up with PCP. Otherwise the labs are okay. No changes in the regimen.

## 2023-12-27 ENCOUNTER — Ambulatory Visit: Payer: Medicare HMO | Admitting: Adult Health

## 2024-01-02 NOTE — Progress Notes (Signed)
Cardiology Office Note:  .   Date:  01/03/2024  ID:  Donald Palmer, DOB 12/28/1960, MRN [id number] PCP: Elder Negus, MD  [facility] Providers Cardiologist: Dr. Rosemary Holms }   History of Present Illness: .   Donald Palmer is a 63 y.o. male with hx of HTN, Dyslipidemia, Gout, CAD s/p CABG X #3, PAD previously underwent injection atherectomy and DCB PTCA to left popliteal artery for symptoms of claudication. More recently, he underwent peripheral angiography by Dr. Randie Heinz that showed 50% left common femoral artery disease, followed by occlusion of left SFA within prior popliteal stent, with reconstitution of three-vessel runoff just above the knee level   CABG on 11/13/2023 due to severe multivessel CAD, He had LIMA to LAD, RSVG PDA, OM with Dr. Cliffton Asters.  He has a follow-up appointment on 01/09/2024 with [facility].  Last seen on 12/02/2023 for post hospital follow up. He was tired and did not have a lot of energy at that time.He was easily short of breath.He was to see Dr. Cliffton Asters for pain management.   He comes today doing well from a cardiac standpoint.  He offers no chest pain shortness of breath dizziness or fatigue.  He and his wife are recently divorced and she has asked him to move out.  He is living with his daughter now and has been put back on the list for housing.  He is asking for help getting this expedited.  He is also having pain in his left leg and is due to be followed by Dr. Randie Heinz on February 05, 2024 to have this appointment to schedule treatment.  ROS: As above otherwise negative  Studies Reviewed: Marland Kitchen    LHC 10/16/2023 LM: Distal 40% stenosis of mild calcification LAD: Proximal-mid 80% calcific stenosis at trifurcation of LAD, diag, and septal perforator. Lcx: Ostial 80% stenosis before a high bifurcating OM1         OM2 proximal 80% disease RCA: Proximal focal 75% stenosis          Distal RCA occlusion          Left to right collaterals to distal RCA/PDA   LVEDP  16 mmHg LVEF 45-50% with severe inferior hypokinesis   Physical Exam:   VS:  BP 126/88   Pulse 99   Ht 5\' 9"  (1.753 m)   Wt 177 lb 3.2 oz (80.4 kg)   SpO2 99%   BMI 26.17 kg/m    Wt Readings from Last 3 Encounters:  01/03/24 177 lb 3.2 oz (80.4 kg)  12/02/23 179 lb 12.8 oz (81.6 kg)  11/18/23 178 lb 9.6 oz (81 kg)    GEN: Well nourished, well developed in no acute distress NECK: No JVD; No carotid bruits CARDIAC: RRR, no murmurs, rubs, gallops sternotomy scar is well-healed. RESPIRATORY:  Clear to auscultation without rales, wheezing or rhonchi  ABDOMEN: Soft, non-tender, non-distended EXTREMITIES:  No edema; No deformity, diminished pulses in the left lower leg.  ASSESSMENT AND PLAN: .    Coronary artery disease: He is status postcoronary artery bypass grafting.  Due to follow-up with [facility] on January 09, 2024.  He is doing well from cardiac standpoint.  Able to afford his medications.  Blood pressure is well-controlled.  He is little tachycardic today as he is complaining a lot of all of his social problems.  I will not make any changes currently.  He is to be released by Dr. Cliffton Asters to have cardiac rehab when seen next.  2. PAD:  History of injection atherectomy and DCB PTCA to left popliteal artery for symptoms of claudication is due to follow-up with Dr. Randie Heinz on February 05, 2024.  He was found to have a left common femoral artery disease.  Ongoing management per [facility].  3.  Social determinants of health: He is without a place to live as he and his wife have divorced.  He is now living with his daughter and section 8 housing.  He is on the list to have a new place to live.  He is on Medicare and Medicaid.  He is requesting expectation of getting a new place to live.  Will refer him to our social worker to see if she can be helpful.    4. Hypertension:    Signed, Bettey Mare. Liborio Nixon, ANP, AACC

## 2024-01-03 ENCOUNTER — Encounter: Payer: Self-pay | Admitting: Adult Health

## 2024-01-03 ENCOUNTER — Ambulatory Visit: Payer: Medicare HMO | Attending: Adult Health | Admitting: Adult Health

## 2024-01-03 VITALS — BP 126/88 | HR 99 | Ht 69.0 in | Wt 177.2 lb

## 2024-01-03 DIAGNOSIS — I2510 Atherosclerotic heart disease of native coronary artery without angina pectoris: Secondary | ICD-10-CM | POA: Diagnosis not present

## 2024-01-03 DIAGNOSIS — I739 Peripheral vascular disease, unspecified: Secondary | ICD-10-CM | POA: Diagnosis not present

## 2024-01-03 DIAGNOSIS — I10 Essential (primary) hypertension: Secondary | ICD-10-CM | POA: Diagnosis not present

## 2024-01-03 DIAGNOSIS — E7800 Pure hypercholesterolemia, unspecified: Secondary | ICD-10-CM | POA: Diagnosis not present

## 2024-01-03 NOTE — Addendum Note (Signed)
Addended by: Myna Hidalgo A on: 01/03/2024 11:10 AM   Modules accepted: Orders

## 2024-01-03 NOTE — Patient Instructions (Addendum)
Medication Instructions:  No Changes *If you need a refill on your cardiac medications before your next appointment, please call your pharmacy*   Lab Work: No Labs If you have labs (blood work) drawn today and your tests are completely normal, you will receive your results only by: MyChart Message (if you have MyChart) OR A paper copy in the mail If you have any lab test that is abnormal or we need to change your treatment, we will call you to review the results.   Testing/Procedures: No Testitng   Follow-Up: At [facility], you and your health needs are our priority.  As part of our continuing mission to provide you with exceptional heart care, we have created designated Provider Care Teams.  These Care Teams include your primary Cardiologist (physician) and Advanced Practice Providers (APPs -  Physician Assistants and Nurse Practitioners) who all work together to provide you with the care you need, when you need it.  We recommend signing up for the patient portal called "MyChart".  Sign up information is provided on this After Visit Summary.  MyChart is used to connect with patients for Virtual Visits (Telemedicine).  Patients are able to view lab/test results, encounter notes, upcoming appointments, etc.  Non-urgent messages can be sent to your provider as well.   To learn more about what you can do with MyChart, go to [email or web address].    Your next appointment:   Keep Scheduled Appointment  Provider:   Truett Mainland, MD

## 2024-01-06 ENCOUNTER — Telehealth: Payer: Self-pay | Admitting: Licensed Clinical Social Worker

## 2024-01-06 NOTE — Progress Notes (Signed)
Heart and Vascular Care Navigation  01/06/2024  Donald Palmer 09-Oct-1961 [id number]  Reason for Referral: "pt wants team to see if they can expedite Section 8 housing" Patient is participating in a Managed Medicaid Plan: No, [employer] Medicare and Medicaid  Engaged with patient by telephone for initial visit for Heart and Vascular Care Coordination.                                                                                                   Assessment:  LCSW attempted pt twice, pt returned call but didn't leave message for this Clinical research associate. His voicemail is full. LCSW was able to connect with him during his second call to me. Introduced self, role, reason for call. Pt confirmed home address listed he doesn't reside at anymore, due to divorce. He is staying at family and friends homes. He recently has been staying with daughter at [address] [place], [address] but cannot stay there long term as she has voucher from Section 8. Pt himself has also applied for Section 8 and would like to be assisted with speeding this up. LCSW shared that we would not be able to provide documentation/expedite process unless he spoke with her regarding prioritization and they shared this is possible- we would need clear instructions as to what is needed documentation wise from his caseworker if this is possible.  Pt does have access to a car, receives social security disability income and has both Medicare and Medicaid. He gets medications without difficulty. Still sees PCP near [place] at this time. He uses some DME to get around. He also receives SNAP for food assistance. Denies any issues with medical bills or medications at this time.   LCSW discussed I could connect him with some housing information from our community partners at [employer] for Housing and [employer], Senior Resources and [email or web address]. I shared since we are not a housing provider ourselves that he would be best served to ensure  he engages with local community housing providers and get on as many waitlists as possible as pt only wants to spend about $500 monthly on housing.   I also will connect him with additional food assistance resources as needed, [employer] and affordable housing lists. Pt would like to pick these up tomorrow. He is agreeable to coming to [place] to grab them.                                       HRT/VAS Care Coordination     Patients Home Cardiology Office [facility]   Outpatient Care Team Social Worker   Social Worker Name: Octavio Graves, [place], [phone]   Living arrangements for the past 2 months Single Family Home; No permanent address   Lives with: Relatives; Adult Children   Patient Current Insurance Coverage Managed Medicare; Medicaid   Patient Has Concern With Paying Medical Bills No   Does Patient Have Prescription Coverage? Yes   Home Assistive Devices/Equipment Cane (specify quad  or straight); Eyeglasses   Current home services DME  rolling walker, cane       Social History:                                                                             SDOH Screenings   Food Insecurity: Food Insecurity Present (01/06/2024)  Housing: High Risk (01/06/2024)  Transportation Needs: No Transportation Needs (01/06/2024)  Utilities: Not At Risk (01/06/2024)  Alcohol Screen: Low Risk  (02/05/2019)  Depression (PHQ2-9): Low Risk  (06/11/2022)  Financial Resource Strain: Medium Risk (01/06/2024)  Tobacco Use: High Risk (01/03/2024)  Health Literacy: Adequate Health Literacy (01/06/2024)    SDOH Interventions: Financial Resources:  Financial Strain Interventions: Other (Comment), [employer] Provided (single income- looking for housing; provided with food, housing resources and my card) DSS for financial assistance  Food Insecurity:  Food Insecurity Interventions: [employer] Provided (Garment/textile technologist, pt receives [employer])  Housing  Insecurity:  Housing Interventions: [employer] Provided (living with family and friends; provided housing clinics and [email or web address], pt on waitlist for Section 8)  Transportation:   Transportation Interventions: Intervention Not Indicated    Other Care Navigation Interventions:     Provided Pharmacy assistance resources  Pt denies any issues obtaining or affording medications at this time   Follow-up plan:   LCSW will leave the following at [employer] for pt to stop by and gather at his request: my card, Geographical information systems officer of [employer], housing packets from [employer] for [employer], [employer], and Environmental manager for [employer]. Will f/u with pt next week for any additional questions.

## 2024-01-07 ENCOUNTER — Other Ambulatory Visit: Payer: Self-pay | Admitting: Thoracic Surgery (Cardiothoracic Vascular Surgery)

## 2024-01-07 DIAGNOSIS — I2510 Atherosclerotic heart disease of native coronary artery without angina pectoris: Secondary | ICD-10-CM

## 2024-01-08 ENCOUNTER — Other Ambulatory Visit: Payer: Self-pay | Admitting: Thoracic Surgery (Cardiothoracic Vascular Surgery)

## 2024-01-09 ENCOUNTER — Ambulatory Visit: Payer: Medicare HMO | Admitting: Surgical

## 2024-01-09 ENCOUNTER — Ambulatory Visit
Admission: RE | Admit: 2024-01-09 | Discharge: 2024-01-09 | Disposition: A | Discharge status: Home / Self Care | Payer: Medicare HMO | Source: Ambulatory Visit | Attending: Thoracic Surgery (Cardiothoracic Vascular Surgery) | Admitting: Thoracic Surgery (Cardiothoracic Vascular Surgery)

## 2024-01-09 VITALS — BP 145/86 | HR 83 | Resp 20 | Wt 183.8 lb

## 2024-01-09 DIAGNOSIS — I2510 Atherosclerotic heart disease of native coronary artery without angina pectoris: Secondary | ICD-10-CM

## 2024-01-09 DIAGNOSIS — Z951 Presence of aortocoronary bypass graft: Secondary | ICD-10-CM

## 2024-01-09 NOTE — Progress Notes (Signed)
[address]       [place] [address]             [phone]      [facility] Medical Record #[id number] Date of Birth: 18-Nov-1961  Referring: Elder Negus, MD Primary Care: Maura Crandall, FNP Primary Cardiologist: None   Chief Complaint:   POST OP FOLLOW UP Post-op Dx:  same Procedure: CABG X 3.  LIMA LAD, RSVG PDA, OM   Endoscopic greater saphenous vein harvest on the right     Surgeon and Role:      * Lightfoot, Eliezer Lofts, MD - Primary    * Gaynelle Arabian , PA-C - assisting   Anesthesia  general   History of Present Illness:   Patient is a 63 year old male seen in the office on today's date and routine postsurgical follow-up status post the above described procedure.  Overall he reports that he is doing well in his recovery.  Denies any significant issues.  He has been doing some things that are not recommended in terms of post cardiac surgical recovery including push-ups and weightlifting.  He has been driving not had any difficulty with this.  He denies chest pain, shortness of breath or other significant constitutional symptoms.  He is not having any palpitations or lower extremity edema.  He does see vascular surgery and is hoping he can have his next procedure on his left leg moved up because it is the only thing that is causing him difficulty at this time.      Past Medical History:  Diagnosis Date   Back pain, chronic    Coronary artery disease    Diabetes mellitus 2007   Hepatitis C    [id number]   History of chronic hepatitis C 05/13/2012   First noted 2013. RF inc tattoos obtained while incarcerated. Harvoni 2016 SVR 2017    Hypertension goal BP (blood pressure) < 140/80    Microcytic anemia    Neuromuscular disorder (HCC)    PAD (peripheral artery disease) (HCC)    Pancreatitis 01/09/2012     Social History   Tobacco Use  Smoking Status Some Days   Current packs/day: 0.10   Average packs/day:  0.1 packs/day for 30.0 years (3.0 ttl pk-yrs)   Types: Cigarettes, Cigars  Smokeless Tobacco Never  Tobacco Comments   Per pt, 3 cigarettes per day, as of 11/11/23    Social History   Substance and Sexual Activity  Alcohol Use Not Currently   Alcohol/week: 5.0 standard drinks of alcohol   Types: 5 Cans of beer per week   Comment: Beer.     Allergies  Allergen Reactions   Metformin And Related Other (See Comments)    GI side effects. Stopped 2016    Current Outpatient Medications  Medication Sig Dispense Refill   Accu-Chek Softclix Lancets lancets check blood sugar 2 times a day 100 each 5   acetaminophen (TYLENOL) 325 MG tablet Take 2 tablets (650 mg total) by mouth every 6 (six) hours.     apixaban (ELIQUIS) 5 MG TABS tablet Take 1 tablet (5 mg total) by mouth 2 (two) times daily. 60 tablet 1   atorvastatin (LIPITOR) 80 MG tablet Take 1 tablet (80 mg total) by mouth daily at 6 PM. 90 tablet 3   blood glucose meter kit and supplies KIT Use to check glucose twice a day 1 each 0   Blood Glucose Monitoring Suppl (ACCU-CHEK GUIDE)  w/Device KIT Check blood sugar 2 times a day 1 kit 1   clopidogrel (PLAVIX) 75 MG tablet Take 1 tablet (75 mg total) by mouth daily. 90 tablet 3   colchicine 0.6 MG tablet Take 0.6 mg by mouth daily as needed (gout flare).     fluticasone (FLONASE) 50 MCG/ACT nasal spray USE 2 SPRAYS IN EACH NOSTRIL DAILY 16 g 11   glucose blood (ACCU-CHEK GUIDE) test strip USE TO CHECK GLUCOSE TWICE DAILY 100 strip 5   insulin degludec (TRESIBA) 100 UNIT/ML FlexTouch Pen Inject 20 Units into the skin daily. (Patient taking differently: Inject 40 Units into the skin as needed (high blood sugar).) 6 mL 5   Insulin Pen Needle (PEN NEEDLES) 32G X 4 MM MISC Use to inject liraglutide once a day 100 each 3   Lancet Devices MISC 1 each by Does not apply route 2 (two) times daily. Please use to check blood sugar 2 times daily. diag code E11.40. noninsulin dependent 100 each 11    metoprolol tartrate (LOPRESSOR) 25 MG tablet Take 1 tablet (25 mg total) by mouth 2 (two) times daily. 60 tablet 2   omeprazole (PRILOSEC) 40 MG capsule Take 1 capsule (40 mg total) by mouth daily. 90 capsule 3   pioglitazone (ACTOS) 30 MG tablet Take 30 mg by mouth daily.     Specialty Vitamins Products (NERVIVE NERVE RELIEF PO) Take 1 tablet by mouth daily.     No current facility-administered medications for this visit.       Physical Exam: BP (!) 145/86 (BP Location: Right Arm, Patient Position: Sitting, Cuff Size: Large)   Pulse 83   Resp 20   Wt 183 lb 12.8 oz (83.4 kg)   SpO2 98% Comment: RA  BMI 27.14 kg/m   General appearance: alert, cooperative, and no distress Heart: regular rate and rhythm Lungs: clear to auscultation bilaterally Abdomen: Benign exam Extremities: Trace edema Wound: Incisions well-healed without evidence of infection   Diagnostic Studies & Laboratory data:     Recent Radiology Findings:   DG Chest 2 View Result Date: 01/09/2024 CLINICAL DATA:  cabg.  Chest pain. EXAM: CHEST - 2 VIEW COMPARISON:  11/15/2023. FINDINGS: Bilateral lung fields are clear. Bilateral costophrenic angles are clear. Normal cardio-mediastinal silhouette. Sternotomy wires noted. No acute osseous abnormalities. The soft tissues are within normal limits. IMPRESSION: *No active cardiopulmonary disease. Electronically Signed   By: Jules Schick M.D.   On: 01/09/2024 14:02      Recent Lab Findings: Lab Results  Component Value Date   WBC 5.7 12/02/2023   HGB 11.0 (L) 12/02/2023   HCT 35.7 (L) 12/02/2023   PLT 728 (H) 12/02/2023   GLUCOSE 151 (H) 12/02/2023   CHOL 184 02/26/2022   TRIG 203 (H) 02/26/2022   HDL 48 02/26/2022   LDLCALC 101 (H) 02/26/2022   ALT 17 11/13/2023   AST 33 11/13/2023   NA 142 12/02/2023   K 4.4 12/02/2023   CL 103 12/02/2023   CREATININE 1.08 12/02/2023   BUN 7 (L) 12/02/2023   CO2 22 12/02/2023   TSH 0.927 06/03/2019   INR 1.2 11/13/2023    HGBA1C 8.7 (H) 11/11/2023      Assessment / Plan: He is doing well in his postsurgical recovery.  We discussed activity progression and the importance of continuing to be careful in terms of sternal healing.  I reviewed his x-ray and there are no current issues.  We discussed not doing push-ups and limited weight lifting only  to 20 pounds with care not to do certain movements.  I did not make any changes to his current medication regimen.  We will see the patient again on a as needed basis for any surgically related needs or at request.      Medication Changes: No orders of the defined types were placed in this encounter.     Rowe Clack, PA-C  01/09/2024 2:40 PM

## 2024-01-09 NOTE — Patient Instructions (Signed)
Activity progression instructions given to the patient verbally.  This is not doing certain movements such as push-ups or heavy weight lifting.

## 2024-01-13 ENCOUNTER — Telehealth: Payer: Self-pay | Admitting: Licensed Clinical Social Worker

## 2024-01-13 NOTE — Telephone Encounter (Signed)
H&V Care Navigation CSW Progress Note  Clinical Social Worker contacted patient by phone to f/u on resources picked up by pt. He was reached at [phone]. He shares he has "called all the housing" and added his name to waitlists but most are 2-3years. Encouraged him to f/u before beginning of each month to see if any openings and ask landlords/leasing agencies of additional housing available not on the list.  I will f/u next moth as discussed to see if any movement, encouraged him to call me also during this time as needed with any additional questions or resources.  Patient is participating in a Managed Medicaid Plan:  No, [employer] Medicare only  SDOH Screenings   Food Insecurity: Food Insecurity Present (01/06/2024)  Housing: High Risk (01/06/2024)  Transportation Needs: No Transportation Needs (01/06/2024)  Utilities: Not At Risk (01/06/2024)  Alcohol Screen: Low Risk  (02/05/2019)  Depression (PHQ2-9): Low Risk  (06/11/2022)  Financial Resource Strain: Medium Risk (01/06/2024)  Tobacco Use: High Risk (01/09/2024)  Health Literacy: Adequate Health Literacy (01/06/2024)    Octavio Graves, MSW, LCSW Clinical Social Worker II [facility] Heart/Vascular Care Navigation  [phone]- work cell phone (preferred) [phone]- desk phone

## 2024-01-28 ENCOUNTER — Telehealth: Payer: Self-pay | Admitting: Licensed Clinical Social Worker

## 2024-01-28 NOTE — Telephone Encounter (Signed)
H&V Care Navigation CSW Progress Note  Clinical Social Worker contacted patient by phone to f/u on community resources sent. LCSW was able to reach pt at [phone]. States he is doing okay, has applied to a "new" housing development on [address] that is an affordable housing community- opened last summer, he has paid a deposit or application fee (he isnt sure) and was told it may be 3-4 months. Continues to look. Pt also has decided to re-establish with [facility] and he is aware of date and how to get there.  He asks for me to text him the information about his vascular appt. Sent date, time and address to his phone. No additional questions- encouraged him to call me as needed.    Patient is participating in a Managed Medicaid Plan:  No, [employer]  SDOH Screenings   Food Insecurity: Food Insecurity Present (01/06/2024)  Housing: High Risk (01/06/2024)  Transportation Needs: No Transportation Needs (01/06/2024)  Utilities: Not At Risk (01/06/2024)  Alcohol Screen: Low Risk  (02/05/2019)  Depression (PHQ2-9): Low Risk  (06/11/2022)  Financial Resource Strain: Medium Risk (01/06/2024)  Tobacco Use: High Risk (01/09/2024)  Health Literacy: Adequate Health Literacy (01/06/2024)   Donald Palmer, MSW, LCSW Clinical Social Worker II [facility] Heart/Vascular Care Navigation  [phone]- work cell phone (preferred) [phone]- desk phone '

## 2024-01-30 ENCOUNTER — Encounter: Payer: Self-pay | Admitting: Student

## 2024-01-30 ENCOUNTER — Ambulatory Visit: Payer: 59 | Admitting: Student

## 2024-01-30 VITALS — BP 136/77 | HR 79 | Temp 98.0°F | Ht 69.0 in | Wt 176.3 lb

## 2024-01-30 DIAGNOSIS — E1151 Type 2 diabetes mellitus with diabetic peripheral angiopathy without gangrene: Secondary | ICD-10-CM | POA: Diagnosis not present

## 2024-01-30 DIAGNOSIS — N521 Erectile dysfunction due to diseases classified elsewhere: Secondary | ICD-10-CM

## 2024-01-30 DIAGNOSIS — E1140 Type 2 diabetes mellitus with diabetic neuropathy, unspecified: Secondary | ICD-10-CM | POA: Diagnosis not present

## 2024-01-30 DIAGNOSIS — M109 Gout, unspecified: Secondary | ICD-10-CM

## 2024-01-30 DIAGNOSIS — E1169 Type 2 diabetes mellitus with other specified complication: Secondary | ICD-10-CM | POA: Diagnosis not present

## 2024-01-30 DIAGNOSIS — I739 Peripheral vascular disease, unspecified: Secondary | ICD-10-CM

## 2024-01-30 DIAGNOSIS — Z794 Long term (current) use of insulin: Secondary | ICD-10-CM | POA: Diagnosis not present

## 2024-01-30 DIAGNOSIS — M62830 Muscle spasm of back: Secondary | ICD-10-CM | POA: Insufficient documentation

## 2024-01-30 MED ORDER — LANCETS MISC. MISC
1.0000 | Freq: Three times a day (TID) | 0 refills | Status: AC
Start: 2024-01-30 — End: 2024-02-29

## 2024-01-30 MED ORDER — METOPROLOL TARTRATE 25 MG PO TABS: 25.0000 mg | ORAL_TABLET | Freq: Two times a day (BID) | ORAL | 2 refills | Status: DC

## 2024-01-30 MED ORDER — ATORVASTATIN CALCIUM 80 MG PO TABS: 80.0000 mg | ORAL_TABLET | Freq: Every day | ORAL | 3 refills | Status: DC

## 2024-01-30 MED ORDER — OMEPRAZOLE 40 MG PO CPDR: 40.0000 mg | DELAYED_RELEASE_CAPSULE | Freq: Every day | ORAL | 3 refills | Status: DC

## 2024-01-30 MED ORDER — LANCET DEVICE MISC: 1.0000 | Freq: Three times a day (TID) | 0 refills | Status: AC

## 2024-01-30 MED ORDER — CYCLOBENZAPRINE HCL 5 MG PO TABS: 5.0000 mg | ORAL_TABLET | Freq: Two times a day (BID) | ORAL | 0 refills | Status: AC | PRN

## 2024-01-30 MED ORDER — CLOPIDOGREL BISULFATE 75 MG PO TABS: 75.0000 mg | ORAL_TABLET | Freq: Every day | ORAL | 3 refills | Status: DC

## 2024-01-30 MED ORDER — TADALAFIL 10 MG PO TABS: 10.0000 mg | ORAL_TABLET | ORAL | 1 refills | Status: DC | PRN

## 2024-01-30 MED ORDER — APIXABAN 5 MG PO TABS: 5.0000 mg | ORAL_TABLET | Freq: Two times a day (BID) | ORAL | 1 refills | Status: DC

## 2024-01-30 MED ORDER — BLOOD GLUCOSE MONITORING SUPPL DEVI: 1.0000 | Freq: Three times a day (TID) | 0 refills | Status: DC

## 2024-01-30 MED ORDER — COLCHICINE 0.6 MG PO TABS: 0.6000 mg | ORAL_TABLET | Freq: Every day | ORAL | 2 refills | Status: DC | PRN

## 2024-01-30 MED ORDER — BLOOD GLUCOSE TEST VI STRP: 1.0000 | ORAL_STRIP | Freq: Three times a day (TID) | 0 refills | Status: DC

## 2024-01-30 NOTE — Assessment & Plan Note (Signed)
He has a history of gout and has flares occasionally predominantly in the MTP joint.  He usually keeps colchicine on hand for treatment of acute flares.  He has run out is requesting a refill, so I have sent a refill to the pharmacy.

## 2024-01-30 NOTE — Addendum Note (Signed)
Addended by: Annett Fabian on: 01/30/2024 05:09 PM   Modules accepted: Level of Service

## 2024-01-30 NOTE — Assessment & Plan Note (Addendum)
History of type 2 diabetes.  Last hemoglobin A1c was 8.7 in December.  We will recheck hemoglobin A1c today.  He is currently taking 40 units [place] daily.  He checks his fasting blood sugars at home and says they are usually around 150.  He denies any lows.  He denies any polyuria, polydipsia, neuropathy.  He has stopped taking pioglitazone, which is okay, as I worry about this in combination with his insulin.  He is in a rush today, so I have sent in refills of his glucometer, lancets, and strips.  I have encouraged him to continue using his insulin regularly and checking his fasting blood sugars.    We will follow-up in 1 month. He will need foot exam and recheck of his hemoglobin A1c at that visit.

## 2024-01-30 NOTE — Progress Notes (Signed)
CC: medication refill  HPI: [name] is a 63 y.o. male living with a history stated below and presents today for medication refill. Please see problem based assessment and plan for additional details.  Past Medical History:  Diagnosis Date   Back pain, chronic    Coronary artery disease    Diabetes mellitus 2007   Hepatitis C    [id number]   History of chronic hepatitis C 05/13/2012   First noted 2013. RF inc tattoos obtained while incarcerated. Harvoni 2016 SVR 2017    Hypertension goal BP (blood pressure) < 140/80    Microcytic anemia    Neuromuscular disorder (HCC)    PAD (peripheral artery disease) (HCC)    Pancreatitis 01/09/2012   Current Outpatient Medications on File Prior to Visit  Medication Sig Dispense Refill   Accu-Chek Softclix Lancets lancets check blood sugar 2 times a day 100 each 5   acetaminophen (TYLENOL) 325 MG tablet Take 2 tablets (650 mg total) by mouth every 6 (six) hours.     blood glucose meter kit and supplies KIT Use to check glucose twice a day 1 each 0   Blood Glucose Monitoring Suppl (ACCU-CHEK GUIDE) w/Device KIT Check blood sugar 2 times a day 1 kit 1   fluticasone (FLONASE) 50 MCG/ACT nasal spray USE 2 SPRAYS IN EACH NOSTRIL DAILY 16 g 11   insulin degludec (TRESIBA) 100 UNIT/ML FlexTouch Pen Inject 20 Units into the skin daily. (Patient taking differently: Inject 40 Units into the skin as needed (high blood sugar).) 6 mL 5   Insulin Pen Needle (PEN NEEDLES) 32G X 4 MM MISC Use to inject liraglutide once a day 100 each 3   Lancet Devices MISC 1 each by Does not apply route 2 (two) times daily. Please use to check blood sugar 2 times daily. diag code E11.40. noninsulin dependent 100 each 11   Specialty Vitamins Products (NERVIVE NERVE RELIEF PO) Take 1 tablet by mouth daily.     No current facility-administered medications on file prior to visit.   Family History  Problem Relation Age of Onset   Diabetes Mother    Hypertension Mother     Hyperlipidemia Mother    Asthma Sister    Obesity Sister    Diabetes Brother    Diabetes Brother    Diabetes Daughter    Heart attack Neg Hx    Sudden death Neg Hx     Social History   Socioeconomic History   Marital status: Single    Spouse name: Not on file   Number of children: 4   Years of education: Not on file   Highest education level: 12th grade  Occupational History   Not on file  Tobacco Use   Smoking status: Some Days    Current packs/day: 0.10    Average packs/day: 0.1 packs/day for 30.0 years (3.0 ttl pk-yrs)    Types: Cigarettes, Cigars   Smokeless tobacco: Never   Tobacco comments:    Per pt, 3 cigarettes per day, as of 11/11/23  Vaping Use   Vaping status: Never Used  Substance and Sexual Activity   Alcohol use: Not Currently    Alcohol/week: 5.0 standard drinks of alcohol    Types: 5 Cans of beer per week    Comment: Beer.   Drug use: Not Currently    Frequency: 1.0 times per week    Types: Marijuana    Comment: rarely   Sexual activity: Yes  Other Topics Concern  Not on file  Social History Narrative   Current Social History 08/15/2021        Patient lives with his daughter in a home which is 1 story. There are 8 steps up to the entrance the patient uses with a railing      Patient's method of transportation is personal car.      The highest level of education was some high school.      The patient currently disabled.      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"       Current Stressors: "money"       Religious / Personal Beliefs: "no"       Social Drivers of Corporate investment banker Strain: Medium Risk (01/06/2024)   Overall Financial Resource Strain (CARDIA)    Difficulty of Paying Living Expenses: Somewhat hard  Food Insecurity: Food Insecurity Present (01/06/2024)   Hunger Vital Sign    Worried About Running Out of Food in the  Last Year: Sometimes true    Ran Out of Food in the Last Year: Never true  Transportation Needs: No Transportation Needs (01/06/2024)   PRAPARE - Administrator, Civil Service (Medical): No    Lack of Transportation (Non-Medical): No  Physical Activity: Not on file  Stress: Not on file  Social Connections: Not on file  Intimate Partner Violence: Not At Risk (11/15/2023)   Humiliation, Afraid, Rape, and Kick questionnaire    Fear of Current or Ex-Partner: No    Emotionally Abused: No    Physically Abused: No    Sexually Abused: No   Review of Systems: ROS negative except for what is noted on the assessment and plan.  Vitals:   01/30/24 1429  BP: 136/77  Pulse: 79  Temp: 98 F (36.7 C)  TempSrc: Oral  SpO2: 100%  Weight: 176 lb 4.8 oz (80 kg)  Height: 5\' 9"  (1.753 m)   Physical Exam: Well-appearing man, brought his medications to the visit Regular cardiac rate and rhythm, no lower extremity edema Lungs clear to auscultation bilaterally, normal effort and respiratory rate No focal neurological deficits, alert and oriented x 4 No rashes or skin lesions appreciated  Assessment & Plan:   Patient discussed with Dr. Mayford Knife  Type 2 diabetes mellitus with diabetic neuropathy (HCC) History of type 2 diabetes.  Last hemoglobin A1c was 8.7 in December.  We will recheck hemoglobin A1c today.  He is currently taking 40 units [place] daily.  He checks his fasting blood sugars at home and says they are usually around 150.  He denies any lows.  He denies any polyuria, polydipsia, neuropathy.  He has stopped taking pioglitazone, which is okay, as I worry about this in combination with his insulin.  He is in a rush today, so I have sent in refills of his glucometer, lancets, and strips.  I have encouraged him to continue using his insulin regularly and checking his fasting blood sugars.    We will follow-up in 1 month. He will need foot exam and recheck of his hemoglobin A1c at  that visit.   Gout He has a history of gout and has flares occasionally predominantly in the MTP joint.  He usually keeps colchicine on hand for treatment of acute flares.  He has run out is requesting a refill, so I have sent  a refill to the pharmacy.  PAD (peripheral artery disease) (HCC) He has a significant history of peripheral vascular disease and has been following with vascular surgery for this.  He is s/p angioplasty and atherectomy.  He was previously taking Plavix 75 mg daily and Eliquis 5 mg twice daily, apparently for PAD.  He is also taking atorvastatin 80 mg daily.  He has been taking his Eliquis regularly but has not had refills of his Plavix and statin.  I have sent in refills of these medications for him.  He has a follow-up appointment with Dr. Randie Heinz and vascular surgery on 2/19 for further evaluation of possible intervention for his PAD.  Since he is in a rush, we will recheck a lipid panel at his next follow-up visit.  Erectile dysfunction associated with type 2 diabetes mellitus (HCC) He reports erectile dysfunction, including lack of erections nocturnally.  He is requesting medication for assistance with this.  I have sent in a prescription for Cialis 10 mg to use as needed whenever he anticipates sexual activity.   Muscle spasm of back He reports chronic muscle spasms in his lumbar back, for which she has been taking Flexeril intermittently.  He says this medication provides him a lot of relief.  I have prescribed him a short course for his muscle spasms.  He also has been receiving oxycodone Toradol from another provider for chronic back pain.  He is going to reach out to the provider who prescribed these to see if a refill would be appropriate.  Annett Fabian, MD [facility] Internal Medicine, PGY-1 Phone: [phone] Date 01/30/2024 Time 4:48 PM

## 2024-01-30 NOTE — Assessment & Plan Note (Signed)
He reports chronic muscle spasms in his lumbar back, for which she has been taking Flexeril intermittently.  He says this medication provides him a lot of relief.  I have prescribed him a short course for his muscle spasms.  He also has been receiving oxycodone Toradol from another provider for chronic back pain.  He is going to reach out to the provider who prescribed these to see if a refill would be appropriate.

## 2024-01-30 NOTE — Assessment & Plan Note (Signed)
He reports erectile dysfunction, including lack of erections nocturnally.  He is requesting medication for assistance with this.  I have sent in a prescription for Cialis 10 mg to use as needed whenever he anticipates sexual activity.

## 2024-01-30 NOTE — Patient Instructions (Signed)
Thank you, [name] for allowing [place] to provide your care today. Today we discussed your diabetes and medications.  I have sent in refills of your medications to your pharmacy.  For some of the pain medications such as oxycodone and Toradol, I recommend you call the previous physician who evaluated you to request refills of those medications.  It is better for the same physician to continue refilling them as much as possible.  I have sent Cialis to your pharmacy.  You may take this medication once whenever you expect you will have sexual intercourse.  1 side effect of this medication is low blood pressure and dizziness when standing up.  If you have a side effect while taking this medication, please stop taking this and consider other options.  We will schedule a follow-up visit for you in 1 month.  At that time, we will reevaluate your diabetes and blood pressure and make medication adjustments as necessary.  We may also check some labs at that follow-up visit.  Follow up: 4 weeks  We look forward to seeing you next time. Please call our clinic at [phone] if you have any questions or concerns. The best time to call is Monday-Friday from 9am-4pm, but there is someone available 24/7. If after hours or the weekend, call the main hospital number and ask for the Internal Medicine Resident On-Call. If you need medication refills, please notify your pharmacy one week in advance and they will send [place] a request.   Thank you for trusting me with your care. Wishing you the best!   Annett Fabian, MD [facility]

## 2024-01-30 NOTE — Assessment & Plan Note (Addendum)
He has a significant history of peripheral vascular disease and has been following with vascular surgery for this.  He is s/p angioplasty and atherectomy.  He was previously taking Plavix 75 mg daily and Eliquis 5 mg twice daily, apparently for PAD.  He is also taking atorvastatin 80 mg daily.  He has been taking his Eliquis regularly but has not had refills of his Plavix and statin.  I have sent in refills of these medications for him.  He has a follow-up appointment with Dr. Randie Heinz and vascular surgery on 2/19 for further evaluation of possible intervention for his PAD.  Since he is in a rush, we will recheck a lipid panel at his next follow-up visit.

## 2024-02-05 ENCOUNTER — Ambulatory Visit (INDEPENDENT_AMBULATORY_CARE_PROVIDER_SITE_OTHER): Payer: 59 | Admitting: Vascular Surgery

## 2024-02-05 ENCOUNTER — Encounter: Payer: Self-pay | Admitting: Vascular Surgery

## 2024-02-05 VITALS — BP 141/90 | HR 106 | Temp 97.9°F | Resp 20 | Ht 69.0 in | Wt 175.0 lb

## 2024-02-05 DIAGNOSIS — I70222 Atherosclerosis of native arteries of extremities with rest pain, left leg: Secondary | ICD-10-CM

## 2024-02-05 NOTE — Progress Notes (Signed)
[name], male   DOB: 1961-06-18, 63 y.o.   MRN: [id number]  Reason for Consult: Follow-up   Referred by Donald Negus, MD  Subjective:     HPI:  Donald Palmer is a 63 y.o. male history of claudication left greater than right lower extremity with a history of atherectomy of the left SFA.  More recently Donald was found to have occlusion of the left SFA was planned for bypass with left common femoral endarterectomy during workup ended up with coronary artery bypass grafting.  Donald Palmer.  Donald states Palmer his leg hurts frequently with walking short distances but also at rest particularly at night.  Donald remains on Eliquis and Plavix.  Past Medical History:  Diagnosis Date   Back pain, chronic    Coronary artery disease    Diabetes mellitus 2007   Hepatitis C    [id number]   History of chronic hepatitis C 05/13/2012   First noted 2013. [employer] tattoos obtained while incarcerated. [place] 2016 SVR 2017    Hypertension goal BP (blood pressure) < 140/80    Microcytic anemia    Neuromuscular disorder (HCC)    PAD (peripheral artery disease) (HCC)    Pancreatitis 01/09/2012   Family History  Problem Relation Age of Onset   Diabetes Mother    Hypertension Mother    Hyperlipidemia Mother    Asthma Sister    Obesity Sister    Diabetes Brother    Diabetes Brother    Diabetes Daughter    Heart attack Neg Hx    Sudden death Neg Hx    Past Surgical History:  Procedure Laterality Date   ABDOMINAL AORTOGRAM N/A 03/03/2018   Procedure: ABDOMINAL AORTOGRAM;  Surgeon: Donald Gess, MD;  Location: [facility];  Service: Cardiovascular;  Laterality: N/A;   ABDOMINAL AORTOGRAM W/LOWER EXTREMITY N/A 06/15/2019   Procedure: ABDOMINAL AORTOGRAM W/LOWER EXTREMITY;  Surgeon: Donald Gess, MD;  Location: [facility];  Service: Cardiovascular;  Laterality: N/A;   ABDOMINAL AORTOGRAM W/LOWER EXTREMITY N/A 09/09/2023   Procedure: ABDOMINAL AORTOGRAM  W/LOWER EXTREMITY;  Surgeon: Donald Harman, MD;  Location[facility];  Service: Cardiovascular;  Laterality: N/A;   CORONARY ARTERY BYPASS GRAFT N/A 11/13/2023   Procedure: CORONARY ARTERY BYPASS GRAFTING X 3, USING LEFT INTERNAL MAMMARY ARTERY AND ENDOSCOPICALLY HARVESTED RIGHT SAPHENOUS VEIN GRAFT;  Surgeon: Donald Skains, MD;  Location: [facility] OR;  Service: Open Heart Surgery;  Laterality: N/A;   I & D EXTREMITY Right 05/15/2016   Procedure: RIGHT INDEX FINGER FIRST THROUGH  MIDDLE PHALYNX  AMPUTATION;  Surgeon: Donald Hook, MD;  Location: [facility] OR;  Service: Orthopedics;  Laterality: Right;   LEFT HEART CATH AND CORONARY ANGIOGRAPHY N/A 10/16/2023   Procedure: LEFT HEART CATH AND CORONARY ANGIOGRAPHY;  Surgeon: Donald Negus, MD;  Location: [facility];  Service: Cardiovascular;  Laterality: N/A;   LOWER EXTREMITY INTERVENTION Bilateral 03/03/2018   Procedure: LOWER EXTREMITY INTERVENTION;  Surgeon: Donald Gess, MD;  Location: [facility];  Service: Cardiovascular;  Laterality: Bilateral;   OPEN REDUCTION INTERNAL FIXATION (ORIF) DISTAL PHALANX Right 04/02/2016   Procedure: RIGHT INDEX FINGER REPAIR;  Surgeon: Donald Hook, MD;  Location: [facility];  Service: Orthopedics;  Laterality: Right;   PERIPHERAL VASCULAR ATHERECTOMY Left 03/03/2018   Procedure: PERIPHERAL VASCULAR ATHERECTOMY;  Surgeon: Donald Gess, MD;  Location: [facility] INVASIVE CV LAB;  Service: Cardiovascular;  Laterality: Left;  SFA WITH PTA DRUG COATED BALLOON   PERIPHERAL VASCULAR INTERVENTION Left 06/15/2019   Procedure: PERIPHERAL VASCULAR INTERVENTION;  Surgeon: Donald Gess, MD;  Location: [facility];  Service: Cardiovascular;  Laterality: Left;   TEE WITHOUT CARDIOVERSION N/A 11/13/2023   Procedure: TRANSESOPHAGEAL ECHOCARDIOGRAM;  Surgeon: Donald Skains, MD;  Location: [facility] OR;  Service: Open Heart Surgery;  Laterality: N/A;    Short Social  History:  Social History   Tobacco Use   Smoking status: Some Days    Current packs/day: 0.10    Average packs/day: 0.1 packs/day for 30.0 years (3.0 ttl pk-yrs)    Types: Cigarettes, Cigars   Smokeless tobacco: Never   Tobacco comments:    Per pt, 3 cigarettes per day, as of 11/11/23  Substance Use Topics   Alcohol use: Not Currently    Alcohol/week: 5.0 standard drinks of alcohol    Types: 5 Cans of beer per week    Comment: Beer.    Allergies  Allergen Reactions   Metformin And Related Other (See Comments)    GI side effects. Stopped 2016    Current Outpatient Medications  Medication Sig Dispense Refill   Accu-Chek Softclix Lancets lancets check blood sugar 2 times a day 100 each 5   acetaminophen (TYLENOL) 325 MG tablet Take 2 tablets (650 mg total) by mouth every 6 (six) hours.     apixaban (ELIQUIS) 5 MG TABS tablet Take 1 tablet (5 mg total) by mouth 2 (two) times daily. 60 tablet 1   atorvastatin (LIPITOR) 80 MG tablet Take 1 tablet (80 mg total) by mouth daily at 6 PM. 90 tablet 3   blood glucose meter kit and supplies KIT Use to check glucose twice a day 1 each 0   Blood Glucose Monitoring Suppl (ACCU-CHEK GUIDE) w/Device KIT Check blood sugar 2 times a day 1 kit 1   Blood Glucose Monitoring Suppl DEVI 1 each by Does not apply route in the morning, at noon, and at bedtime. May substitute to any manufacturer covered by patient's insurance. 1 each 0   clopidogrel (PLAVIX) 75 MG tablet Take 1 tablet (75 mg total) by mouth daily. 90 tablet 3   colchicine 0.6 MG tablet Take 1 tablet (0.6 mg total) by mouth daily as needed (gout flare). 30 tablet 2   cyclobenzaprine (FLEXERIL) 5 MG tablet Take 1 tablet (5 mg total) by mouth 2 (two) times daily as needed for up to 20 days for muscle spasms. 30 tablet 0   fluticasone (FLONASE) 50 MCG/ACT nasal spray USE 2 SPRAYS IN EACH NOSTRIL DAILY 16 g 11   Glucose Blood (BLOOD GLUCOSE TEST STRIPS) STRP 1 each by In Vitro route in the  morning, at noon, and at bedtime. May substitute to any manufacturer covered by patient's insurance. 100 strip 0   insulin degludec (TRESIBA) 100 UNIT/ML FlexTouch Pen Inject 20 Units into the skin daily. (Patient taking differently: Inject 40 Units into the skin as needed (high blood sugar).) 6 mL 5   Insulin Pen Needle (PEN NEEDLES) 32G X 4 MM MISC Use to inject liraglutide once a day 100 each 3   Lancet Device MISC 1 each by Does not apply route in the morning, at noon, and at bedtime. May substitute to any manufacturer covered by patient's insurance. 1 each 0   Lancet Devices MISC 1 each by Does not apply route 2 (two) times daily. Please use to check blood sugar 2 times daily. diag code  E11.40. noninsulin dependent 100 each 11   Lancets Misc. MISC 1 each by Does not apply route in the morning, at noon, and at bedtime. May substitute to any manufacturer covered by patient's insurance. 100 each 0   metoprolol tartrate (LOPRESSOR) 25 MG tablet Take 1 tablet (25 mg total) by mouth 2 (two) times daily. 60 tablet 2   omeprazole (PRILOSEC) 40 MG capsule Take 1 capsule (40 mg total) by mouth daily. 90 capsule 3   Specialty Vitamins Products (NERVIVE NERVE RELIEF PO) Take 1 tablet by mouth daily.     tadalafil (CIALIS) 10 MG tablet Take 1 tablet (10 mg total) by mouth as needed for erectile dysfunction. 20 tablet 1   No current facility-administered medications for this visit.    Review of Systems  Constitutional:  Constitutional negative. HENT: HENT negative.  Eyes: Eyes negative.  Respiratory: Respiratory negative.  Cardiovascular: Positive for claudication.  GI: Gastrointestinal negative.  Musculoskeletal: Positive for leg pain.  Neurological: Neurological negative. Hematologic: Hematologic/lymphatic negative.  Psychiatric: Psychiatric negative.        Objective:  Objective   Vitals:   02/05/24 1138  BP: (!) 141/90  Pulse: (!) 106  Resp: 20  Temp: 97.9 F (36.6 C)  SpO2: 99%   Weight: 175 lb (79.4 kg)  Height: 5\' 9"  (1.753 m)   Body mass index is 25.84 kg/m.  Physical Exam HENT:     Head: Normocephalic.     Nose: Nose normal.  Eyes:     Pupils: Pupils are equal, round, and reactive to light.  Cardiovascular:     Rate and Rhythm: Normal rate.     Pulses:          Femoral pulses are 1+ on the right side and 1+ on the left side. Pulmonary:     Effort: Pulmonary effort is normal.  Abdominal:     General: Abdomen is flat.  Musculoskeletal:     Right lower leg: No edema.     Left lower leg: No edema.  Skin:    General: Skin is warm.     Capillary Refill: Capillary refill takes more than 3 seconds.  Neurological:     General: No focal deficit present.     Mental Status: Donald is alert.  Psychiatric:        Mood and Affect: Mood normal.     Data: ABI Findings:  +---------+------------------+-----+----------+--------+  Right   Rt Pressure (mmHg)IndexWaveform  Comment   +---------+------------------+-----+----------+--------+  Brachial 142                                        +---------+------------------+-----+----------+--------+  PTA     137               0.96 triphasic           +---------+------------------+-----+----------+--------+  DP      86                0.61 monophasic          +---------+------------------+-----+----------+--------+  Great Toe65                0.46                     +---------+------------------+-----+----------+--------+   +---------+------------------+-----+----------+-------+  Left    Lt Pressure (mmHg)IndexWaveform  Comment  +---------+------------------+-----+----------+-------+  Brachial 137                                       +---------+------------------+-----+----------+-------+  PTA     75                0.53 monophasic         +---------+------------------+-----+----------+-------+  DP      78                0.55 monophasic          +---------+------------------+-----+----------+-------+  Great Toe28                0.20                    +---------+------------------+-----+----------+-------+   +-------+-----------+-----------+------------+------------+  ABI/TBIToday's ABIToday's TBIPrevious ABIPrevious TBI  +-------+-----------+-----------+------------+------------+  Right 0.96       0.46       1.05        0.8           +-------+-----------+-----------+------------+------------+  Left  0.55       0.2        1.05        0.74          +-------+-----------+-----------+------------+------------+        Previous ABI on 01/18/20 at [facility].  Pedal pressures may be falsely elevated.    Summary:  Right: Resting right ankle-brachial index is within normal range. The  right toe-brachial index is abnormal.   Left: Resting left ankle-brachial index indicates moderate left lower  extremity arterial disease. The left toe-brachial index is abnormal.      Assessment/Plan:     63 year old male with atherosclerosis of his native arteries with rest pain in his left lower extremity and ABI which is consistent and is moderately depressed with toe pressure of 28.  Thankfully Donald does not have any ulcerations.  We have discussed proceeding with repeat angiography from the right common femoral approach.  Given his recent CABG we may need to be more aggressive from an endovascular standpoint which would include stenting of his left SFA and if this is not feasible we could again consider left femoral to above-knee popliteal artery bypass likely with graft and Donald would also more than likely require common femoral endarterectomy.  We discussed the risk benefits alternatives need to hold Eliquis for 72 hours prior to procedure.  Donald demonstrates good understanding we will have him scheduled on Monday of any future.  Donald Palmer has atherosclerosis of the native arteries of the Left lower extremities causing ischemic  rest pain. The patient is on best medical therapy for peripheral arterial disease. The patient has been counseled about the risks of tobacco use in atherosclerotic disease. The patient has been counseled to abstain from any tobacco use. An aortogram with bilateral lower extremity runoff angiography and Left lower extremity intervention and is indicated to better evaluate the patient's lower extremity circulation because of the  limb threatening nature of the patient's diagnosis. Based on the patient's clinical exam and non-invasive data, we anticipate an endovascular intervention in the femoropopliteal vessels. Stenting and/or athrectomy would be favored because of the improved primary patency of these interventions as compared to plain balloon angioplasty.      Donald Harman MD [facility]

## 2024-02-05 NOTE — H&P (View-Only) (Signed)
 [name], male   DOB: 1961-06-18, 63 y.o.   MRN: [id number]  Reason for Consult: Follow-up   Referred by Elder Negus, MD  Subjective:     HPI:  Donald Palmer is a 63 y.o. male history of claudication left greater than right lower extremity with a history of atherectomy of the left SFA.  More recently he was found to have occlusion of the left SFA was planned for bypass with left common femoral endarterectomy during workup ended up with coronary artery bypass grafting.  He is now recovered for that.  He states that his leg hurts frequently with walking short distances but also at rest particularly at night.  He remains on Eliquis and Plavix.  Past Medical History:  Diagnosis Date   Back pain, chronic    Coronary artery disease    Diabetes mellitus 2007   Hepatitis C    [id number]   History of chronic hepatitis C 05/13/2012   First noted 2013. [employer] tattoos obtained while incarcerated. [place] 2016 SVR 2017    Hypertension goal BP (blood pressure) < 140/80    Microcytic anemia    Neuromuscular disorder (HCC)    PAD (peripheral artery disease) (HCC)    Pancreatitis 01/09/2012   Family History  Problem Relation Age of Onset   Diabetes Mother    Hypertension Mother    Hyperlipidemia Mother    Asthma Sister    Obesity Sister    Diabetes Brother    Diabetes Brother    Diabetes Daughter    Heart attack Neg Hx    Sudden death Neg Hx    Past Surgical History:  Procedure Laterality Date   ABDOMINAL AORTOGRAM N/A 03/03/2018   Procedure: ABDOMINAL AORTOGRAM;  Surgeon: Runell Gess, MD;  Location: [facility];  Service: Cardiovascular;  Laterality: N/A;   ABDOMINAL AORTOGRAM W/LOWER EXTREMITY N/A 06/15/2019   Procedure: ABDOMINAL AORTOGRAM W/LOWER EXTREMITY;  Surgeon: Runell Gess, MD;  Location: [facility];  Service: Cardiovascular;  Laterality: N/A;   ABDOMINAL AORTOGRAM W/LOWER EXTREMITY N/A 09/09/2023   Procedure: ABDOMINAL AORTOGRAM  W/LOWER EXTREMITY;  Surgeon: Maeola Harman, MD;  Location: [facility];  Service: Cardiovascular;  Laterality: N/A;   CORONARY ARTERY BYPASS GRAFT N/A 11/13/2023   Procedure: CORONARY ARTERY BYPASS GRAFTING X 3, USING LEFT INTERNAL MAMMARY ARTERY AND ENDOSCOPICALLY HARVESTED RIGHT SAPHENOUS VEIN GRAFT;  Surgeon: Corliss Skains, MD;  Location: [facility] OR;  Service: Open Heart Surgery;  Laterality: N/A;   I & D EXTREMITY Right 05/15/2016   Procedure: RIGHT INDEX FINGER FIRST THROUGH  MIDDLE PHALYNX  AMPUTATION;  Surgeon: Mack Hook, MD;  Location: [facility] OR;  Service: Orthopedics;  Laterality: Right;   LEFT HEART CATH AND CORONARY ANGIOGRAPHY N/A 10/16/2023   Procedure: LEFT HEART CATH AND CORONARY ANGIOGRAPHY;  Surgeon: Elder Negus, MD;  Location: [facility];  Service: Cardiovascular;  Laterality: N/A;   LOWER EXTREMITY INTERVENTION Bilateral 03/03/2018   Procedure: LOWER EXTREMITY INTERVENTION;  Surgeon: Runell Gess, MD;  Location: [facility];  Service: Cardiovascular;  Laterality: Bilateral;   OPEN REDUCTION INTERNAL FIXATION (ORIF) DISTAL PHALANX Right 04/02/2016   Procedure: RIGHT INDEX FINGER REPAIR;  Surgeon: Mack Hook, MD;  Location: [facility];  Service: Orthopedics;  Laterality: Right;   PERIPHERAL VASCULAR ATHERECTOMY Left 03/03/2018   Procedure: PERIPHERAL VASCULAR ATHERECTOMY;  Surgeon: Runell Gess, MD;  Location: [facility] INVASIVE CV LAB;  Service: Cardiovascular;  Laterality: Left;  SFA WITH PTA DRUG COATED BALLOON   PERIPHERAL VASCULAR INTERVENTION Left 06/15/2019   Procedure: PERIPHERAL VASCULAR INTERVENTION;  Surgeon: Runell Gess, MD;  Location: [facility];  Service: Cardiovascular;  Laterality: Left;   TEE WITHOUT CARDIOVERSION N/A 11/13/2023   Procedure: TRANSESOPHAGEAL ECHOCARDIOGRAM;  Surgeon: Corliss Skains, MD;  Location: [facility] OR;  Service: Open Heart Surgery;  Laterality: N/A;    Short Social  History:  Social History   Tobacco Use   Smoking status: Some Days    Current packs/day: 0.10    Average packs/day: 0.1 packs/day for 30.0 years (3.0 ttl pk-yrs)    Types: Cigarettes, Cigars   Smokeless tobacco: Never   Tobacco comments:    Per pt, 3 cigarettes per day, as of 11/11/23  Substance Use Topics   Alcohol use: Not Currently    Alcohol/week: 5.0 standard drinks of alcohol    Types: 5 Cans of beer per week    Comment: Beer.    Allergies  Allergen Reactions   Metformin And Related Other (See Comments)    GI side effects. Stopped 2016    Current Outpatient Medications  Medication Sig Dispense Refill   Accu-Chek Softclix Lancets lancets check blood sugar 2 times a day 100 each 5   acetaminophen (TYLENOL) 325 MG tablet Take 2 tablets (650 mg total) by mouth every 6 (six) hours.     apixaban (ELIQUIS) 5 MG TABS tablet Take 1 tablet (5 mg total) by mouth 2 (two) times daily. 60 tablet 1   atorvastatin (LIPITOR) 80 MG tablet Take 1 tablet (80 mg total) by mouth daily at 6 PM. 90 tablet 3   blood glucose meter kit and supplies KIT Use to check glucose twice a day 1 each 0   Blood Glucose Monitoring Suppl (ACCU-CHEK GUIDE) w/Device KIT Check blood sugar 2 times a day 1 kit 1   Blood Glucose Monitoring Suppl DEVI 1 each by Does not apply route in the morning, at noon, and at bedtime. May substitute to any manufacturer covered by patient's insurance. 1 each 0   clopidogrel (PLAVIX) 75 MG tablet Take 1 tablet (75 mg total) by mouth daily. 90 tablet 3   colchicine 0.6 MG tablet Take 1 tablet (0.6 mg total) by mouth daily as needed (gout flare). 30 tablet 2   cyclobenzaprine (FLEXERIL) 5 MG tablet Take 1 tablet (5 mg total) by mouth 2 (two) times daily as needed for up to 20 days for muscle spasms. 30 tablet 0   fluticasone (FLONASE) 50 MCG/ACT nasal spray USE 2 SPRAYS IN EACH NOSTRIL DAILY 16 g 11   Glucose Blood (BLOOD GLUCOSE TEST STRIPS) STRP 1 each by In Vitro route in the  morning, at noon, and at bedtime. May substitute to any manufacturer covered by patient's insurance. 100 strip 0   insulin degludec (TRESIBA) 100 UNIT/ML FlexTouch Pen Inject 20 Units into the skin daily. (Patient taking differently: Inject 40 Units into the skin as needed (high blood sugar).) 6 mL 5   Insulin Pen Needle (PEN NEEDLES) 32G X 4 MM MISC Use to inject liraglutide once a day 100 each 3   Lancet Device MISC 1 each by Does not apply route in the morning, at noon, and at bedtime. May substitute to any manufacturer covered by patient's insurance. 1 each 0   Lancet Devices MISC 1 each by Does not apply route 2 (two) times daily. Please use to check blood sugar 2 times daily. diag code  E11.40. noninsulin dependent 100 each 11   Lancets Misc. MISC 1 each by Does not apply route in the morning, at noon, and at bedtime. May substitute to any manufacturer covered by patient's insurance. 100 each 0   metoprolol tartrate (LOPRESSOR) 25 MG tablet Take 1 tablet (25 mg total) by mouth 2 (two) times daily. 60 tablet 2   omeprazole (PRILOSEC) 40 MG capsule Take 1 capsule (40 mg total) by mouth daily. 90 capsule 3   Specialty Vitamins Products (NERVIVE NERVE RELIEF PO) Take 1 tablet by mouth daily.     tadalafil (CIALIS) 10 MG tablet Take 1 tablet (10 mg total) by mouth as needed for erectile dysfunction. 20 tablet 1   No current facility-administered medications for this visit.    Review of Systems  Constitutional:  Constitutional negative. HENT: HENT negative.  Eyes: Eyes negative.  Respiratory: Respiratory negative.  Cardiovascular: Positive for claudication.  GI: Gastrointestinal negative.  Musculoskeletal: Positive for leg pain.  Neurological: Neurological negative. Hematologic: Hematologic/lymphatic negative.  Psychiatric: Psychiatric negative.        Objective:  Objective   Vitals:   02/05/24 1138  BP: (!) 141/90  Pulse: (!) 106  Resp: 20  Temp: 97.9 F (36.6 C)  SpO2: 99%   Weight: 175 lb (79.4 kg)  Height: 5\' 9"  (1.753 m)   Body mass index is 25.84 kg/m.  Physical Exam HENT:     Head: Normocephalic.     Nose: Nose normal.  Eyes:     Pupils: Pupils are equal, round, and reactive to light.  Cardiovascular:     Rate and Rhythm: Normal rate.     Pulses:          Femoral pulses are 1+ on the right side and 1+ on the left side. Pulmonary:     Effort: Pulmonary effort is normal.  Abdominal:     General: Abdomen is flat.  Musculoskeletal:     Right lower leg: No edema.     Left lower leg: No edema.  Skin:    General: Skin is warm.     Capillary Refill: Capillary refill takes more than 3 seconds.  Neurological:     General: No focal deficit present.     Mental Status: He is alert.  Psychiatric:        Mood and Affect: Mood normal.     Data: ABI Findings:  +---------+------------------+-----+----------+--------+  Right   Rt Pressure (mmHg)IndexWaveform  Comment   +---------+------------------+-----+----------+--------+  Brachial 142                                        +---------+------------------+-----+----------+--------+  PTA     137               0.96 triphasic           +---------+------------------+-----+----------+--------+  DP      86                0.61 monophasic          +---------+------------------+-----+----------+--------+  Great Toe65                0.46                     +---------+------------------+-----+----------+--------+   +---------+------------------+-----+----------+-------+  Left    Lt Pressure (mmHg)IndexWaveform  Comment  +---------+------------------+-----+----------+-------+  Brachial 137                                       +---------+------------------+-----+----------+-------+  PTA     75                0.53 monophasic         +---------+------------------+-----+----------+-------+  DP      78                0.55 monophasic          +---------+------------------+-----+----------+-------+  Great Toe28                0.20                    +---------+------------------+-----+----------+-------+   +-------+-----------+-----------+------------+------------+  ABI/TBIToday's ABIToday's TBIPrevious ABIPrevious TBI  +-------+-----------+-----------+------------+------------+  Right 0.96       0.46       1.05        0.8           +-------+-----------+-----------+------------+------------+  Left  0.55       0.2        1.05        0.74          +-------+-----------+-----------+------------+------------+        Previous ABI on 01/18/20 at [facility].  Pedal pressures may be falsely elevated.    Summary:  Right: Resting right ankle-brachial index is within normal range. The  right toe-brachial index is abnormal.   Left: Resting left ankle-brachial index indicates moderate left lower  extremity arterial disease. The left toe-brachial index is abnormal.      Assessment/Plan:     63 year old male with atherosclerosis of his native arteries with rest pain in his left lower extremity and ABI which is consistent and is moderately depressed with toe pressure of 28.  Thankfully he does not have any ulcerations.  We have discussed proceeding with repeat angiography from the right common femoral approach.  Given his recent CABG we may need to be more aggressive from an endovascular standpoint which would include stenting of his left SFA and if this is not feasible we could again consider left femoral to above-knee popliteal artery bypass likely with graft and he would also more than likely require common femoral endarterectomy.  We discussed the risk benefits alternatives need to hold Eliquis for 72 hours prior to procedure.  He demonstrates good understanding we will have him scheduled on Monday of any future.  Donald Palmer has atherosclerosis of the native arteries of the Left lower extremities causing ischemic  rest pain. The patient is on best medical therapy for peripheral arterial disease. The patient has been counseled about the risks of tobacco use in atherosclerotic disease. The patient has been counseled to abstain from any tobacco use. An aortogram with bilateral lower extremity runoff angiography and Left lower extremity intervention and is indicated to better evaluate the patient's lower extremity circulation because of the  limb threatening nature of the patient's diagnosis. Based on the patient's clinical exam and non-invasive data, we anticipate an endovascular intervention in the femoropopliteal vessels. Stenting and/or athrectomy would be favored because of the improved primary patency of these interventions as compared to plain balloon angioplasty.      Maeola Harman MD [facility]

## 2024-02-06 ENCOUNTER — Other Ambulatory Visit: Payer: Self-pay

## 2024-02-06 DIAGNOSIS — I70222 Atherosclerosis of native arteries of extremities with rest pain, left leg: Secondary | ICD-10-CM

## 2024-02-06 NOTE — Progress Notes (Signed)
 Internal Medicine Clinic Attending  Case discussed with the resident at the time of the visit.  We reviewed the resident's history and exam and pertinent patient test results.  I agree with the assessment, diagnosis, and plan of care documented in the resident's note.

## 2024-02-21 ENCOUNTER — Telehealth: Payer: Self-pay

## 2024-02-21 NOTE — Telephone Encounter (Signed)
 Patient called to ask about taking his medication prior to surgery.  This nurse left a detailed message on his voice mail.  Patient also walked in at physician's office and this nurse explained his medication in person with another letter printed for his convenience.  Patient confirmed he has stopped taking Eliquis in preparation of his surgery.

## 2024-02-24 ENCOUNTER — Other Ambulatory Visit: Payer: Self-pay

## 2024-02-24 ENCOUNTER — Ambulatory Visit (HOSPITAL_COMMUNITY)
Admission: RE | Disposition: A | Discharge status: Home / Self Care | Payer: Self-pay | Source: Home / Self Care | Attending: Vascular Surgery

## 2024-02-24 ENCOUNTER — Ambulatory Visit (HOSPITAL_COMMUNITY)
Admission: RE | Admit: 2024-02-24 | Discharge: 2024-02-24 | Disposition: A | Discharge status: Home / Self Care | Payer: 59 | Attending: Vascular Surgery | Admitting: Vascular Surgery

## 2024-02-24 DIAGNOSIS — I70222 Atherosclerosis of native arteries of extremities with rest pain, left leg: Secondary | ICD-10-CM

## 2024-02-24 DIAGNOSIS — Z794 Long term (current) use of insulin: Secondary | ICD-10-CM | POA: Insufficient documentation

## 2024-02-24 DIAGNOSIS — F17210 Nicotine dependence, cigarettes, uncomplicated: Secondary | ICD-10-CM | POA: Insufficient documentation

## 2024-02-24 DIAGNOSIS — E1151 Type 2 diabetes mellitus with diabetic peripheral angiopathy without gangrene: Secondary | ICD-10-CM | POA: Diagnosis present

## 2024-02-24 DIAGNOSIS — Z7902 Long term (current) use of antithrombotics/antiplatelets: Secondary | ICD-10-CM | POA: Insufficient documentation

## 2024-02-24 DIAGNOSIS — Z7901 Long term (current) use of anticoagulants: Secondary | ICD-10-CM | POA: Insufficient documentation

## 2024-02-24 DIAGNOSIS — Z951 Presence of aortocoronary bypass graft: Secondary | ICD-10-CM | POA: Insufficient documentation

## 2024-02-24 HISTORY — PX: ABDOMINAL AORTOGRAM W/LOWER EXTREMITY: CATH118223

## 2024-02-24 HISTORY — PX: LOWER EXTREMITY INTERVENTION: CATH118252

## 2024-02-24 LAB — POCT I-STAT, CHEM 8
BUN: 10 mg/dL (ref 8–23)
Calcium, Ion: 1.22 mmol/L (ref 1.15–1.40)
Chloride: 102 mmol/L (ref 98–111)
Creatinine, Ser: 1.2 mg/dL (ref 0.61–1.24)
Glucose, Bld: 89 mg/dL (ref 70–99)
HCT: 43 % (ref 39.0–52.0)
Hemoglobin: 14.6 g/dL (ref 13.0–17.0)
Potassium: 4.2 mmol/L (ref 3.5–5.1)
Sodium: 138 mmol/L (ref 135–145)
TCO2: 26 mmol/L (ref 22–32)

## 2024-02-24 LAB — GLUCOSE, CAPILLARY: Glucose-Capillary: 71 mg/dL (ref 70–99)

## 2024-02-24 SURGERY — ABDOMINAL AORTOGRAM W/LOWER EXTREMITY
Anesthesia: LOCAL

## 2024-02-24 MED ORDER — LABETALOL HCL 5 MG/ML IV SOLN: 10.0000 mg | INTRAVENOUS | Status: DC | PRN

## 2024-02-24 MED ORDER — MIDAZOLAM HCL 2 MG/2ML IJ SOLN
INTRAMUSCULAR | Status: DC | PRN
  Administered 2024-02-24 (×2): 1 mg via INTRAVENOUS

## 2024-02-24 MED ORDER — LIDOCAINE HCL (PF) 1 % IJ SOLN
INTRAMUSCULAR | Status: AC
Start: 2024-02-24 — End: ?
  Filled 2024-02-24: qty 30

## 2024-02-24 MED ORDER — HEPARIN SODIUM (PORCINE) 1000 UNIT/ML IJ SOLN
INTRAMUSCULAR | Status: DC | PRN
  Administered 2024-02-24: 8000 [IU] via INTRAVENOUS

## 2024-02-24 MED ORDER — SODIUM CHLORIDE 0.9 % WEIGHT BASED INFUSION: 1.0000 mL/kg/h | INTRAVENOUS | Status: DC

## 2024-02-24 MED ORDER — SODIUM CHLORIDE 0.9% FLUSH: 3.0000 mL | Freq: Two times a day (BID) | INTRAVENOUS | Status: DC

## 2024-02-24 MED ORDER — IODIXANOL 320 MG/ML IV SOLN
INTRAVENOUS | Status: DC | PRN
  Administered 2024-02-24: 65 mL

## 2024-02-24 MED ORDER — DIPHENHYDRAMINE HCL 50 MG/ML IJ SOLN
25.0000 mg | Freq: Once | INTRAMUSCULAR | Status: AC
Start: 2024-02-24 — End: 2024-02-24
  Administered 2024-02-24: 25 mg via INTRAVENOUS

## 2024-02-24 MED ORDER — DIPHENHYDRAMINE HCL 50 MG/ML IJ SOLN
INTRAMUSCULAR | Status: AC
  Filled 2024-02-24: qty 1

## 2024-02-24 MED ORDER — ONDANSETRON HCL 4 MG/2ML IJ SOLN: 4.0000 mg | Freq: Four times a day (QID) | INTRAMUSCULAR | Status: DC | PRN

## 2024-02-24 MED ORDER — CLOPIDOGREL BISULFATE 300 MG PO TABS
ORAL_TABLET | ORAL | Status: AC
  Filled 2024-02-24: qty 1

## 2024-02-24 MED ORDER — LIDOCAINE HCL (PF) 1 % IJ SOLN
INTRAMUSCULAR | Status: AC
  Filled 2024-02-24: qty 30

## 2024-02-24 MED ORDER — FENTANYL CITRATE (PF) 100 MCG/2ML IJ SOLN
INTRAMUSCULAR | Status: AC
  Filled 2024-02-24: qty 2

## 2024-02-24 MED ORDER — CLOPIDOGREL BISULFATE 300 MG PO TABS
ORAL_TABLET | ORAL | Status: DC | PRN
  Administered 2024-02-24: 300 mg via ORAL

## 2024-02-24 MED ORDER — FENTANYL CITRATE (PF) 100 MCG/2ML IJ SOLN
INTRAMUSCULAR | Status: DC | PRN
  Administered 2024-02-24 (×2): 50 ug via INTRAVENOUS

## 2024-02-24 MED ORDER — LIDOCAINE HCL (PF) 1 % IJ SOLN
INTRAMUSCULAR | Status: DC | PRN
  Administered 2024-02-24: 15 mL

## 2024-02-24 MED ORDER — SODIUM CHLORIDE 0.9 % IV SOLN: INTRAVENOUS | Status: DC

## 2024-02-24 MED ORDER — SODIUM CHLORIDE 0.9% FLUSH: 3.0000 mL | INTRAVENOUS | Status: DC | PRN

## 2024-02-24 MED ORDER — HEPARIN SODIUM (PORCINE) 1000 UNIT/ML IJ SOLN
INTRAMUSCULAR | Status: AC
  Filled 2024-02-24: qty 10

## 2024-02-24 MED ORDER — ACETAMINOPHEN 325 MG PO TABS: 650.0000 mg | ORAL_TABLET | ORAL | Status: DC | PRN

## 2024-02-24 MED ORDER — HYDRALAZINE HCL 20 MG/ML IJ SOLN: 5.0000 mg | INTRAMUSCULAR | Status: DC | PRN

## 2024-02-24 MED ORDER — MIDAZOLAM HCL 2 MG/2ML IJ SOLN
INTRAMUSCULAR | Status: AC
  Filled 2024-02-24: qty 2

## 2024-02-24 MED ORDER — APIXABAN 5 MG PO TABS: 5.0000 mg | ORAL_TABLET | Freq: Two times a day (BID) | ORAL | 1 refills | Status: DC

## 2024-02-24 MED ORDER — SODIUM CHLORIDE 0.9 % IV SOLN: 250.0000 mL | INTRAVENOUS | Status: DC | PRN

## 2024-02-24 SURGICAL SUPPLY — 20 items
BALLN MUSTANG 5X200X135 (BALLOONS) ×2 IMPLANT
BALLOON MUSTANG 5X200X135 (BALLOONS) IMPLANT
CATH OMNI FLUSH 5F 65CM (CATHETERS) IMPLANT
CATH QUICKCROSS SUPP .035X90CM (MICROCATHETER) IMPLANT
CLOSURE MYNX CONTROL 6F/7F (Vascular Products) IMPLANT
COVER DOME SNAP 22 D (MISCELLANEOUS) IMPLANT
GLIDEWIRE ADV .035X260CM (WIRE) IMPLANT
KIT ENCORE 26 ADVANTAGE (KITS) IMPLANT
KIT MICROPUNCTURE NIT STIFF (SHEATH) IMPLANT
KIT SINGLE USE MANIFOLD (KITS) IMPLANT
KIT SYRINGE INJ CVI SPIKEX1 (MISCELLANEOUS) IMPLANT
PACK CARDIAC CATHETERIZATION (CUSTOM PROCEDURE TRAY) IMPLANT
SET ATX-X65L (MISCELLANEOUS) IMPLANT
SHEATH CATAPULT 6FR 45 (SHEATH) IMPLANT
SHEATH PINNACLE 5F 10CM (SHEATH) IMPLANT
SHEATH PINNACLE 6F 10CM (SHEATH) IMPLANT
SHEATH PROBE COVER 6X72 (BAG) IMPLANT
STENT ELUVIA 6X150X130 (Permanent Stent) IMPLANT
STENT ELUVIA 6X60X130 (Permanent Stent) IMPLANT
WIRE BENTSON .035X145CM (WIRE) IMPLANT

## 2024-02-24 NOTE — Interval H&P Note (Signed)
 History and Physical Interval Note:  02/24/2024 7:22 AM  Donald Palmer  has presented today for surgery, with the diagnosis of atherosclerosis lt lower ext with leg pain  [id number].  The various methods of treatment have been discussed with the patient and family. After consideration of risks, benefits and other options for treatment, the patient has consented to  Procedure(s): ABDOMINAL AORTOGRAM W/LOWER EXTREMITY (N/A) as a surgical intervention.  The patient's history has been reviewed, patient examined, no change in status, stable for surgery.  I have reviewed the patient's chart and labs.  Questions were answered to the patient's satisfaction.     [name]

## 2024-02-24 NOTE — Op Note (Signed)
 Patient name: Donald Palmer MRN: [id number] DOB: 19-Dec-1960 Sex: male  02/24/2024 Pre-operative Diagnosis: Atherosclerosis native arteries with left lower extremity rest pain Post-operative diagnosis:  Same Surgeon:  Donald Salk. Randie Heinz, MD Procedure Performed: 1.  Percutaneous ultrasound-guided access and Mynx device closure right common femoral artery 2.  Aortogram with bilateral lower extremity angiography 3.  Catheter selection left SFA and popliteal arteries 4.  Stent left SFA with 6 x 150 mm Eluvia x 2 and 6 x 60 mm Eluvia proximally postdilated with 5 mm balloon 5.  Moderate sedation with fentanyl and Versed for 40 minutes  Indications: 63 year old male with known occluded left SFA with rest pain of his left lower extremity with moderately depressed ABIs and toe pressure of 28.  He had undergone angiography initially was planned for bypass surgery subsequent underwent coronary artery bypass grafting.  He is now indicated for repeat angiography with possible intervention.  Findings: The aorta and iliac segments are calcified however there is no flow-limiting stenosis.  On the right side the common femoral artery is patent with profunda SFA proximally patent and a minx device closure was used at completion.  On the left side the common femoral artery appears heavily calcified.  There was a 5 mm gradient across the common femoral artery only with a palpable common femoral pulse.  The SFA occluded after approximately 5 cm which was diseased and was occluded all the way to the adductor.  This was primarily stented with 6 mm stents postdilated with 5 mm balloon to 0% residual stenosis.  There is brisk runoff via the left posterior tibial artery which was palpable at completion he also has runoff via an more diseased anterior tibial artery.  Plan will be continue Plavix and he will resume Eliquis tomorrow.   Procedure:  The patient was identified in the holding area and taken to room 8.  The patient  was then placed supine on the table and prepped and draped in the usual sterile fashion.  A time out was called.  Ultrasound was used to evaluate the right common femoral artery.  There was some posterior calcium there were common femoral artery with micropuncture needle followed by wire sheath without issue.  We placed a Bentson wire followed by 5 [place] sheath and an Omni catheter was placed to the level of L1 and aortogram was performed followed by pelvic angiography including the bilateral common femoral, superficial femoral and profundofemoral arteries.  We then crossed the bifurcation performed complete left lower extremity angiography.  With the above findings we placed a long 6 French sheath into the proximal left SFA and the patient was fully heparinized.  Using a quick cross catheter and Glidewire advantage were able to cross intraluminally into the above-knee popliteal artery and performed angiography from the above-knee popliteal artery.  We then primarily ballooned the entirety of the SFA followed by stenting with two 6 x 150 mm drug-eluting stents followed by approximately a 6 x 60 mm drug-eluting stent these were all postdilated with 5 mm balloon.  Completion demonstrated no residual stenosis.  We did a pullback gradient using the sheath across the common femoral artery with a 5 mmHg gradient patient will should not require common femoral endarterectomy at this time and will remain on Plavix and he is also on Eliquis which will resume tomorrow.  With this we retracted the sheath into the right external iliac artery and perform retrograde angiography of the right side and elected to exchange for a short  6 French sheath and deployed a minx device.  He tolerated the procedure without any complication there was a palpable left posterior tibial pulse at completion.  Contrast: 65 cc    Donald Goates C. Randie Heinz, MD [facility] Office: [phone] Pager: [phone]

## 2024-02-25 ENCOUNTER — Encounter (HOSPITAL_COMMUNITY): Payer: Self-pay | Admitting: Vascular Surgery

## 2024-02-25 ENCOUNTER — Telehealth: Payer: Self-pay

## 2024-02-25 ENCOUNTER — Emergency Department (HOSPITAL_COMMUNITY)
Admission: EM | Admit: 2024-02-25 | Discharge: 2024-02-25 | Disposition: A | Discharge status: Home / Self Care | Attending: Emergency Medicine | Admitting: Emergency Medicine

## 2024-02-25 ENCOUNTER — Other Ambulatory Visit: Payer: Self-pay

## 2024-02-25 DIAGNOSIS — E119 Type 2 diabetes mellitus without complications: Secondary | ICD-10-CM | POA: Diagnosis not present

## 2024-02-25 DIAGNOSIS — Z794 Long term (current) use of insulin: Secondary | ICD-10-CM | POA: Diagnosis not present

## 2024-02-25 DIAGNOSIS — M79605 Pain in left leg: Secondary | ICD-10-CM | POA: Insufficient documentation

## 2024-02-25 DIAGNOSIS — Z7901 Long term (current) use of anticoagulants: Secondary | ICD-10-CM | POA: Insufficient documentation

## 2024-02-25 LAB — CBG MONITORING, ED: Glucose-Capillary: 113 mg/dL — ABNORMAL HIGH (ref 70–99)

## 2024-02-25 MED ORDER — OXYCODONE-ACETAMINOPHEN 5-325 MG PO TABS
2.0000 | ORAL_TABLET | Freq: Once | ORAL | Status: AC
  Administered 2024-02-25: 2 via ORAL
  Filled 2024-02-25: qty 2

## 2024-02-25 MED ORDER — OXYCODONE-ACETAMINOPHEN 5-325 MG PO TABS: 1.0000 | ORAL_TABLET | Freq: Four times a day (QID) | ORAL | 0 refills | Status: DC | PRN

## 2024-02-25 MED FILL — Lidocaine HCl Local Preservative Free (PF) Inj 1%: INTRAMUSCULAR | Qty: 30 | Status: AC

## 2024-02-25 NOTE — Telephone Encounter (Signed)
 Patient called office to state that he's in the emergency room now.  He states he is in significant pain since having his aortogram yesterday.  He wants to let Dr. Randie Heinz know that he's in the ED due to pain.  This nurse advised patient to stay in the ED to be seen and that Dr. Randie Heinz will be alerted to his visit to the ED.

## 2024-02-25 NOTE — Consult Note (Signed)
 Hospital Consult    Reason for Consult:  left leg pain Referring Physician:  Dr Hyacinth Meeker [facility] ED) MRN #:  [id number]  History of Present Illness: This is a 63 y.o. male underwent local extremity revascularization with stenting yesterday.  He states today that he has pain from the left thigh radiating down to the calf and the foot has edema and pain.  This has been present since last night was not initially present after the procedure.  He has no groin pain.  States that he is taking the Plavix as prescribed.  Past Medical History:  Diagnosis Date   Back pain, chronic    Coronary artery disease    Diabetes mellitus 2007   Hepatitis C    [id number]   History of chronic hepatitis C 05/13/2012   First noted 2013. RF inc tattoos obtained while incarcerated. [place] 2016 SVR 2017    Hypertension goal BP (blood pressure) < 140/80    Microcytic anemia    Neuromuscular disorder (HCC)    PAD (peripheral artery disease) (HCC)    Pancreatitis 01/09/2012    Past Surgical History:  Procedure Laterality Date   ABDOMINAL AORTOGRAM N/A 03/03/2018   Procedure: ABDOMINAL AORTOGRAM;  Surgeon: Runell Gess, MD;  Location: [facility] INVASIVE CV LAB;  Service: Cardiovascular;  Laterality: N/A;   ABDOMINAL AORTOGRAM W/LOWER EXTREMITY N/A 06/15/2019   Procedure: ABDOMINAL AORTOGRAM W/LOWER EXTREMITY;  Surgeon: Runell Gess, MD;  Location: [facility] INVASIVE CV LAB;  Service: Cardiovascular;  Laterality: N/A;   ABDOMINAL AORTOGRAM W/LOWER EXTREMITY N/A 09/09/2023   Procedure: ABDOMINAL AORTOGRAM W/LOWER EXTREMITY;  Surgeon: Maeola Harman, MD;  Location: [facility] INVASIVE CV LAB;  Service: Cardiovascular;  Laterality: N/A;   ABDOMINAL AORTOGRAM W/LOWER EXTREMITY N/A 02/24/2024   Procedure: ABDOMINAL AORTOGRAM W/LOWER EXTREMITY;  Surgeon: Maeola Harman, MD;  Location: [facility] INVASIVE CV LAB;  Service: Cardiovascular;  Laterality: N/A;   CORONARY ARTERY BYPASS GRAFT N/A 11/13/2023   Procedure: CORONARY  ARTERY BYPASS GRAFTING X 3, USING LEFT INTERNAL MAMMARY ARTERY AND ENDOSCOPICALLY HARVESTED RIGHT SAPHENOUS VEIN GRAFT;  Surgeon: Corliss Skains, MD;  Location: [facility] OR;  Service: Open Heart Surgery;  Laterality: N/A;   I & D EXTREMITY Right 05/15/2016   Procedure: RIGHT INDEX FINGER FIRST THROUGH  MIDDLE PHALYNX  AMPUTATION;  Surgeon: Mack Hook, MD;  Location: [facility] OR;  Service: Orthopedics;  Laterality: Right;   LEFT HEART CATH AND CORONARY ANGIOGRAPHY N/A 10/16/2023   Procedure: LEFT HEART CATH AND CORONARY ANGIOGRAPHY;  Surgeon: Elder Negus, MD;  Location: [facility] INVASIVE CV LAB;  Service: Cardiovascular;  Laterality: N/A;   LOWER EXTREMITY INTERVENTION Bilateral 03/03/2018   Procedure: LOWER EXTREMITY INTERVENTION;  Surgeon: Runell Gess, MD;  Location: [facility] INVAS[facility] CV LAB;  Service: Cardiovascular;  Laterality: Bilateral;   LOWER EXTREMITY INTERVENTION  02/24/2024   Procedure: LOWER EXTREMITY INTERVENTION;  Surgeon: Maeola Harman, MD;  Location: [facility] INVASIVE CV LAB;  Service: Cardiovascular;;   OPEN REDUCTION INTERNAL FIXATION (ORIF) DISTAL PHALANX Right 04/02/2016   Procedure: RIGHT INDEX FINGER REPAIR;  Surgeon: Mack Hook, MD;  Location: [facility];  Service: Orthopedics;  Laterality: Right;   PERIPHERAL VASCULAR ATHERECTOMY Left 03/03/2018   Procedure: PERIPHERAL VASCULAR ATHERECTOMY;  Surgeon: Runell Gess, MD;  Location: [facility] INVASIVE CV LAB;  Service: Cardiovascular;  Laterality: Left;  SFA WITH PTA DRUG COATED BALLOON   PERIPHERAL VASCULAR INTERVENTION Left 06/15/2019   Procedure: PERIPHERAL VASCULAR INTERVENTION;  Surgeon: Runell Gess, MD;  Location: [facility] INVASIVE CV  LAB;  Service: Cardiovascular;  Laterality: Left;   TEE WITHOUT CARDIOVERSION N/A 11/13/2023   Procedure: TRANSESOPHAGEAL ECHOCARDIOGRAM;  Surgeon: Corliss Skains, MD;  Location: [facility] OR;  Service: Open Heart Surgery;  Laterality: N/A;    Allergies  Allergen Reactions    Metformin And Related Other (See Comments)    GI side effects. Stopped 2016    Prior to Admission medications   Medication Sig Start Date End Date Taking? Authorizing Provider  oxyCODONE-acetaminophen (PERCOCET/ROXICET) 5-325 MG tablet Take 1 tablet by mouth every 6 (six) hours as needed for severe pain (pain score 7-10). 02/25/24  Yes Eber Hong, MD  Accu-Chek Softclix Lancets lancets check blood sugar 2 times a day 06/05/21   Elige Radon, MD  acetaminophen (TYLENOL) 325 MG tablet Take 2 tablets (650 mg total) by mouth every 6 (six) hours. 11/18/23   Gold, Glenice Laine, PA-C  apixaban (ELIQUIS) 5 MG TABS tablet Take 1 tablet (5 mg total) by mouth 2 (two) times daily. 02/24/24   Maeola Harman, MD  atorvastatin (LIPITOR) 80 MG tablet Take 1 tablet (80 mg total) by mouth daily at 6 PM. 01/30/24   Annett Fabian, MD  blood glucose meter kit and supplies KIT Use to check glucose twice a day 10/08/19   Burns Spain, MD  Blood Glucose Monitoring Suppl (ACCU-CHEK GUIDE) w/Device KIT Check blood sugar 2 times a day 06/05/21   Elige Radon, MD  Blood Glucose Monitoring Suppl DEVI 1 each by Does not apply route in the morning, at noon, and at bedtime. May substitute to any manufacturer covered by patient's insurance. 01/30/24   Annett Fabian, MD  clopidogrel (PLAVIX) 75 MG tablet Take 1 tablet (75 mg total) by mouth daily. 01/30/24   Annett Fabian, MD  colchicine 0.6 MG tablet Take 1 tablet (0.6 mg total) by mouth daily as needed (gout flare). 01/30/24   Annett Fabian, MD  fluticasone [facility]) 50 MCG/ACT nasal spray USE 2 SPRAYS IN [facility] NOSTRIL DAILY 06/05/21   Elige Radon, MD  Glucose Blood (BLOOD GLUCOSE TEST STRIPS) STRP 1 each by In Vitro route in the morning, at noon, and at bedtime. May substitute to any manufacturer covered by patient's insurance. 01/30/24 02/29/24  Annett Fabian, MD  insulin degludec (TRESIBA) 100 UNIT/ML FlexTouch Pen Inject 20 Units into the skin  daily. Patient taking differently: Inject 40 Units into the skin as needed (high blood sugar). 08/16/22   Tyson Alias, MD  Insulin Pen Needle (PEN NEEDLES) 32G X 4 MM MISC Use to inject liraglutide once a day 06/05/21   Elige Radon, MD  Lancet Device MISC 1 each by Does not apply route in the morning, at noon, and at bedtime. May substitute to any manufacturer covered by patient's insurance. 01/30/24 02/29/24  Annett Fabian, MD  Lancet Devices MISC 1 each by Does not apply route 2 (two) times daily. Please use to check blood sugar 2 times daily. diag code E11.40. noninsulin dependent 06/05/21   Elige Radon, MD  Lancets Misc. MISC 1 each by Does not apply route in the morning, at noon, and at bedtime. May substitute to any manufacturer covered by patient's insurance. 01/30/24 02/29/24  Annett Fabian, MD  metoprolol tartrate (LOPRESSOR) 25 MG tablet Take 1 tablet (25 mg total) by mouth 2 (two) times daily. 01/30/24   Annett Fabian, MD  omeprazole (PRILOSEC) 40 MG capsule Take 1 capsule (40 mg total) by mouth daily. 01/30/24   Annett Fabian, MD  Specialty Vitamins Products (NERVIVE NERVE RELIEF  PO) Take 1 tablet by mouth daily.    [provider]  tadalafil (CIALIS) 10 MG tablet Take 1 tablet (10 mg total) by mouth as needed for erectile dysfunction. 01/30/24 01/29/25  Annett Fabian, MD    Social History   Socioeconomic History   Marital status: Single    Spouse name: Not on file   Number of children: 4   Years of education: Not on file   Highest education level: 12th grade  Occupational History   Not on file  Tobacco Use   Smoking status: Some Days    Current packs/day: 0.10    Average packs/day: 0.1 packs/day for 30.0 years (3.0 ttl pk-yrs)    Types: Cigarettes, Cigars   Smokeless tobacco: Never   Tobacco comments:    Per pt, 3 cigarettes per day, as of 11/11/23  Vaping Use   Vaping status: Never Used  Substance and Sexual Activity   Alcohol use: Not  Currently    Alcohol/week: 5.0 standard drinks of alcohol    Types: 5 Cans of beer per week    Comment: Beer.   Drug use: Not Currently    Frequency: 1.0 times per week    Types: Marijuana    Comment: rarely   Sexual activity: Yes  Other Topics Concern   Not on file  Social History Narrative   Current Social History 08/15/2021        Patient lives with his daughter in a home which is 1 story. There are 8 steps up to the entrance the patient uses with a railing      Patient's method of transportation is personal car.      The highest level of education was some high school.      The patient currently disabled.      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"       Current Stressors: "money"       Religious / Personal Beliefs: "no"       Social Drivers of Corporate investment banker Strain: Medium Risk (01/06/2024)   Overall Financial Resource Strain (CARDIA)    Difficulty of Paying Living Expenses: Somewhat hard  Food Insecurity: Food Insecurity Present (01/06/2024)   Hunger Vital Sign    Worried About Running Out of Food in the Last Year: Sometimes true    Ran Out of Food in the Last Year: Never true  Transportation Needs: No Transportation Needs (01/06/2024)   PRAPARE - Administrator, Civil Service (Medical): No    Lack of Transportation (Non-Medical): No  Physical Activity: Not on file  Stress: Not on file  Social Connections: Not on file  Intimate Partner Violence: Not At Risk (11/15/2023)   Humiliation, Afraid, Rape, and Kick questionnaire    Fear of Current or Ex-Partner: No    Emotionally Abused: No    Physically Abused: No    Sexually Abused: No     Family History  Problem Relation Age of Onset   Diabetes Mother    Hypertension Mother    Hyperlipidemia Mother    Asthma Sister    Obesity Sister    Diabetes Brother    Diabetes Brother    Diabetes  Daughter    Heart attack Neg Hx    Sudden death Neg Hx     Review of Systems  Constitutional: Negative.  HENT: Negative.    Eyes: Negative.   Respiratory: Negative.    Cardiovascular:  Positive for leg swelling.  Musculoskeletal:        Left leg pain  Skin: Negative.   Neurological: Negative.   Endo/Heme/Allergies: Negative.   Psychiatric/Behavioral: Negative.       Physical Examination  Vitals:   02/25/24 0810 02/25/24 1320  BP: (!) 118/90 117/88  Pulse: 99 (!) 106  Resp: 18 15  Temp: 97.9 F (36.6 C) 97.7 F (36.5 C)  SpO2: 100% 100%   Body mass index is 26.22 kg/m.  Physical Exam HENT:     Head: Normocephalic.     Nose: Nose normal.  Cardiovascular:     Pulses:          Femoral pulses are 2+ on the right side and 2+ on the left side.      Dorsalis pedis pulses are detected w/ Doppler on the left side.       Posterior tibial pulses are 2+ on the left side.  Musculoskeletal:     Comments: Nonpitting edema of left foot  Skin:    Capillary Refill: Brisk capillary refill left great toe    Comments: Left foot very warm to touch no evidence of infection  Neurological:     General: No focal deficit present.     Mental Status: He is alert and oriented to person, place, and time.  Psychiatric:        Mood and Affect: Mood normal.      CBC    Component Value Date/Time   WBC 5.7 12/02/2023 1442   WBC 11.9 (H) 11/16/2023 0250   RBC 5.16 12/02/2023 1442   RBC 4.69 11/16/2023 0250   HGB 14.6 02/24/2024 0630   HGB 11.0 (L) 12/02/2023 1442   HCT 43.0 02/24/2024 0630   HCT 35.7 (L) 12/02/2023 1442   PLT 728 (H) 12/02/2023 1442   MCV 69 (L) 12/02/2023 1442   MCH 21.3 (L) 12/02/2023 1442   MCH 22.2 (L) 11/16/2023 0250   MCHC 30.8 (L) 12/02/2023 1442   MCHC 31.4 11/16/2023 0250   RDW 13.7 12/02/2023 1442   LYMPHSABS 2.7 10/11/2023 1307   MONOABS 0.5 05/31/2022 1215   EOSABS 0.2 10/11/2023 1307   BASOSABS 0.1 10/11/2023 1307    BMET    Component Value  Date/Time   NA 138 02/24/2024 0630   NA 142 12/02/2023 1442   K 4.2 02/24/2024 0630   CL 102 02/24/2024 0630   CO2 22 12/02/2023 1442   GLUCOSE 89 02/24/2024 0630   BUN 10 02/24/2024 0630   BUN 7 (L) 12/02/2023 1442   CREATININE 1.20 02/24/2024 0630   CREATININE 0.77 10/28/2014 1112   CALCIUM 9.9 12/02/2023 1442   GFRNONAA >60 11/17/2023 0336   GFRNONAA >89 10/28/2014 1112   GFRAA 87 04/28/2020 0944   GFRAA >89 10/28/2014 1112    COAGS: Lab Results  Component Value Date   INR 1.2 11/13/2023   INR 1.0 11/11/2023   INR 1.0 02/19/2018     Non-Invasive Vascular Imaging:   No new studies  ASSESSMENT/PLAN: This is a 63 y.o. male status post left lower extremity endovascular revascularization now with what appears to be reperfusion pain.  He has readily palpable left posterior tibial pulse and a very strong anterior tibial into the dorsalis pedis pulse.  Patient should be okay for discharge with pain medication and can see me as previously scheduled.  Alvah Gilder C. Randie Heinz, MD [facility] Office: [phone] Pager: [phone]

## 2024-02-25 NOTE — ED Triage Notes (Signed)
 Pt came in via POV from home d/t bil leg pain. Pt reports he had a blood clot removed & a stent placed. Pt endorses feeling like a nerve was hit during his procedure & he needs to go back to surgery so he can be "fixed." Rates pain 10/10.

## 2024-02-25 NOTE — Discharge Instructions (Signed)
 The surgeon has seen you, your pain is after the procedure and should go away, you can follow-up in the office at your appointed time  I have prescribed you some oxycodone tablets, 1 tablet every 6 hours as needed for severe pain, this is likely to cause constipation and nausea  Opiate medications such as Percocet or Vicodin or morphine are very strong pain medications that are also very addictive if they are taken for too long, even a single dose can predispose someone to become addicted so be very careful when taking this medication.  You should take the smallest amount possible to relieve your pain, these medications may cause constipation or nausea, please follow-up with your doctor if you are having the need for ongoing pain control despite these medications.  Additionally please be aware that these medications may cause sedation or sleepiness or alter judgment so you should not take this if you are driving a vehicle or operating heavy machinery or taking care of small children.

## 2024-02-25 NOTE — Telephone Encounter (Signed)
 Triage:  -pt called triage line stating his legs are killing him, that he had to go to the ED.  He is requesting to see Dr. Randie Heinz.  -he denies discoloration or cold limbs.  -d/t c/o severe pain since surgery yesterday, pt advised to stay at the hospital and get evaluated.

## 2024-02-25 NOTE — Telephone Encounter (Signed)
 Advice: -pt called again stating he wants to know where Dr. Randie Heinz is and if we called him.  Pt states its been over 2 hours and no one in the ED has called anyone back to the waiting room.  Pt stated he got back into the ED and stated he was wanting to know what happened b/c they haven't called anyone back in 2 hours.   -advised pt that he needed to try to calm down that if he cannot be causing a disturbance, that he needed to wait his turn.  He agrees and states he is going to try.

## 2024-02-25 NOTE — ED Provider Notes (Signed)
 [place] EMERGENCY DEPARTMENT AT [facility] Provider Note   CSN: [id number] Arrival date & time: 02/25/24  0801     History  Chief Complaint  Patient presents with   Bil Leg Pain    Donald Palmer is a 63 y.o. male.  HPI   This patient is a 64 year old male, he has recently undergone stenting of his leg because of some vascular disease in his leg.  He is also a diabetic.  He presents after having ongoing pain.  His procedure was a stenting of the left superficial femoral artery, since that time the patient has had ongoing pain.  He is already been seen by the vascular surgeon in the emergency department and they agree that he is well-perfused and that his pain is secondary to reperfusion pain.  They request that he be given pain medications for home.  The patient has no fever no chills and on my exam is not tachycardic.  Home Medications Prior to Admission medications   Medication Sig Start Date End Date Taking? Authorizing Provider  Accu-Chek Softclix Lancets lancets check blood sugar 2 times a day 06/05/21   Elige Radon, MD  acetaminophen (TYLENOL) 325 MG tablet Take 2 tablets (650 mg total) by mouth every 6 (six) hours. 11/18/23   Gold, Glenice Laine, PA-C  apixaban (ELIQUIS) 5 MG TABS tablet Take 1 tablet (5 mg total) by mouth 2 (two) times daily. 02/24/24   Maeola Harman, MD  atorvastatin (LIPITOR) 80 MG tablet Take 1 tablet (80 mg total) by mouth daily at 6 PM. 01/30/24   Annett Fabian, MD  blood glucose meter kit and supplies KIT Use to check glucose twice a day 10/08/19   Burns Spain, MD  Blood Glucose Monitoring Suppl (ACCU-CHEK GUIDE) w/Device KIT Check blood sugar 2 times a day 06/05/21   Elige Radon, MD  Blood Glucose Monitoring Suppl DEVI 1 each by Does not apply route in the morning, at noon, and at bedtime. May substitute to any manufacturer covered by patient's insurance. 01/30/24   Annett Fabian, MD  clopidogrel (PLAVIX) 75 MG tablet  Take 1 tablet (75 mg total) by mouth daily. 01/30/24   Annett Fabian, MD  colchicine 0.6 MG tablet Take 1 tablet (0.6 mg total) by mouth daily as needed (gout flare). 01/30/24   Annett Fabian, MD  fluticasone [facility]) 50 MCG/ACT nasal spray USE 2 SPRAYS IN [facility] NOSTRIL DAILY 06/05/21   Elige Radon, MD  Glucose Blood (BLOOD GLUCOSE TEST STRIPS) STRP 1 each by In Vitro route in the morning, at noon, and at bedtime. May substitute to any manufacturer covered by patient's insurance. 01/30/24 02/29/24  Annett Fabian, MD  insulin degludec (TRESIBA) 100 UNIT/ML FlexTouch Pen Inject 20 Units into the skin daily. Patient taking differently: Inject 40 Units into the skin as needed (high blood sugar). 08/16/22   Tyson Alias, MD  Insulin Pen Needle (PEN NEEDLES) 32G X 4 MM MISC Use to inject liraglutide once a day 06/05/21   Elige Radon, MD  Lancet Device MISC 1 each by Does not apply route in the morning, at noon, and at bedtime. May substitute to any manufacturer covered by patient's insurance. 01/30/24 02/29/24  Annett Fabian, MD  Lancet Devices MISC 1 each by Does not apply route 2 (two) times daily. Please use to check blood sugar 2 times daily. diag code E11.40. noninsulin dependent 06/05/21   Elige Radon, MD  Lancets Misc. MISC 1 each by Does not apply route in  the morning, at noon, and at bedtime. May substitute to any manufacturer covered by patient's insurance. 01/30/24 02/29/24  Annett Fabian, MD  metoprolol tartrate (LOPRESSOR) 25 MG tablet Take 1 tablet (25 mg total) by mouth 2 (two) times daily. 01/30/24   Annett Fabian, MD  omeprazole (PRILOSEC) 40 MG capsule Take 1 capsule (40 mg total) by mouth daily. 01/30/24   Annett Fabian, MD  Specialty Vitamins Products (NERVIVE NERVE RELIEF PO) Take 1 tablet by mouth daily.    [provider]  tadalafil (CIALIS) 10 MG tablet Take 1 tablet (10 mg total) by mouth as needed for erectile dysfunction. 01/30/24 01/29/25  Annett Fabian, MD      Allergies    Metformin and related    Review of Systems   Review of Systems  All other systems reviewed and are negative.   Physical Exam Updated Vital Signs BP 117/88   Pulse (!) 106   Temp 97.7 F (36.5 C) (Oral)   Resp 15   Ht 1.74 m (5' 8.5")   Wt 79.4 kg   SpO2 100%   BMI 26.22 kg/m  Physical Exam Vitals and nursing note reviewed.  Constitutional:      General: He is not in acute distress.    Appearance: He is well-developed.  HENT:     Palmer: Normocephalic and atraumatic.     Mouth/Throat:     Pharynx: No oropharyngeal exudate.  Eyes:     General: No scleral icterus.       Right eye: No discharge.        Left eye: No discharge.     Conjunctiva/sclera: Conjunctivae normal.     Pupils: Pupils are equal, round, and reactive to light.  Neck:     Thyroid: No thyromegaly.     Vascular: No JVD.  Cardiovascular:     Rate and Rhythm: Normal rate and regular rhythm.     Heart sounds: Normal heart sounds. No murmur heard.    No friction rub. No gallop.  Pulmonary:     Effort: Pulmonary effort is normal. No respiratory distress.     Breath sounds: Normal breath sounds. No wheezing or rales.  Abdominal:     General: Bowel sounds are normal. There is no distension.     Palpations: Abdomen is soft. There is no mass.     Tenderness: There is no abdominal tenderness.  Musculoskeletal:        General: No tenderness. Normal range of motion.     Cervical back: Normal range of motion and neck supple.     Right lower leg: No edema.     Left lower leg: No edema.  Lymphadenopathy:     Cervical: No cervical adenopathy.  Skin:    General: Skin is warm and dry.     Findings: No erythema or rash.     Comments: Well-perfused warm feet  Neurological:     Mental Status: He is alert.     Coordination: Coordination normal.  Psychiatric:        Behavior: Behavior normal.     ED Results / Procedures / Treatments   Labs (all labs ordered are listed, but only  abnormal results are displayed) Labs Reviewed  CBG MONITORING, ED - Abnormal; Notable for the following components:      Result Value   Glucose-Capillary 113 (*)    All other components within normal limits  CBC WITH DIFFERENTIAL/PLATELET  BASIC METABOLIC PANEL    EKG None  Radiology PERIPHERAL VASCULAR  CATHETERIZATION Result Date: 02/24/2024 Images from the original result were not included. Patient name: Donald Palmer MRN: [id number] DOB: 1961-10-10 Sex: male 02/24/2024 Pre-operative Diagnosis: Atherosclerosis native arteries with left lower extremity rest pain Post-operative diagnosis:  Same Surgeon:  Luanna Salk. Randie Heinz, MD Procedure Performed: 1.  Percutaneous ultrasound-guided access and Mynx device closure right common femoral artery 2.  Aortogram with bilateral lower extremity angiography 3.  Catheter selection left SFA and popliteal arteries 4.  Stent left SFA with 6 x 150 mm Eluvia x 2 and 6 x 60 mm Eluvia proximally postdilated with 5 mm balloon 5.  Moderate sedation with fentanyl and Versed for 40 minutes Indications: 63 year old male with known occluded left SFA with rest pain of his left lower extremity with moderately depressed ABIs and toe pressure of 28.  He had undergone angiography initially was planned for bypass surgery subsequent underwent coronary artery bypass grafting.  He is now indicated for repeat angiography with possible intervention. Findings: The aorta and iliac segments are calcified however there is no flow-limiting stenosis.  On the right side the common femoral artery is patent with profunda SFA proximally patent and a minx device closure was used at completion.  On the left side the common femoral artery appears heavily calcified.  There was a 5 mm gradient across the common femoral artery only with a palpable common femoral pulse.  The SFA occluded after approximately 5 cm which was diseased and was occluded all the way to the adductor.  This was primarily stented with 6  mm stents postdilated with 5 mm balloon to 0% residual stenosis.  There is brisk runoff via the left posterior tibial artery which was palpable at completion he also has runoff via an more diseased anterior tibial artery. Plan will be continue Plavix and he will resume Eliquis tomorrow.  Procedure:  The patient was identified in the holding area and taken to room 8.  The patient was then placed supine on the table and prepped and draped in the usual sterile fashion.  A time out was called.  Ultrasound was used to evaluate the right common femoral artery.  There was some posterior calcium there were common femoral artery with micropuncture needle followed by wire sheath without issue.  We placed a Bentson wire followed by 5 [place] sheath and an Omni catheter was placed to the level of L1 and aortogram was performed followed by pelvic angiography including the bilateral common femoral, superficial femoral and profundofemoral arteries.  We then crossed the bifurcation performed complete left lower extremity angiography.  With the above findings we placed a long 6 French sheath into the proximal left SFA and the patient was fully heparinized.  Using a quick cross catheter and Glidewire advantage were able to cross intraluminally into the above-knee popliteal artery and performed angiography from the above-knee popliteal artery.  We then primarily ballooned the entirety of the SFA followed by stenting with two 6 x 150 mm drug-eluting stents followed by approximately a 6 x 60 mm drug-eluting stent these were all postdilated with 5 mm balloon.  Completion demonstrated no residual stenosis.  We did a pullback gradient using the sheath across the common femoral artery with a 5 mmHg gradient patient will should not require common femoral endarterectomy at this time and will remain on Plavix and he is also on Eliquis which will resume tomorrow.  With this we retracted the sheath into the right external iliac artery and perform  retrograde angiography of the right side and  elected to exchange for a short 6 French sheath and deployed a minx device.  He tolerated the procedure without any complication there was a palpable left posterior tibial pulse at completion. Contrast: 65 cc Brandon C. Randie Heinz, MD [facility] Office: [phone] Pager: [phone]    Procedures Procedures    Medications Ordered in ED Medications  oxyCODONE-acetaminophen (PERCOCET/ROXICET) 5-325 MG per tablet 2 tablet (has no administration in time range)    ED Course/ Medical Decision Making/ A&P                                 Medical Decision Making Risk Prescription drug management.    This patient presents to the ED for concern of leg pain after surgery - differential diagnosis includes pst op pain, infection, reperfusion pain.    Additional history obtained:  Additional history obtained from consultant with vascular surgery saw the patient in the emergency department and examined the patient himself as well External records from outside source obtained and reviewed including operative notes   Lab Tests:  I Ordered, and personally interpreted labs.  The pertinent results include: Unnecessary  Imaging Studies ordered:  I ordered imaging studies including unnecessary  Medicines ordered and prescription drug management:  I ordered medication including patient was given 2 Percocets for pain Reevaluation of the patient after these medicines showed that the patient improved I have reviewed the patients home medicines and have made adjustments as needed   Problem List / ED Course:  Stable for discharge   Social Determinants of Health:             Final Clinical Impression(s) / ED Diagnoses Final diagnoses:  None    Rx / DC Orders ED Discharge Orders     None         Eber Hong, MD 02/25/24 1519

## 2024-02-26 ENCOUNTER — Telehealth: Payer: Self-pay

## 2024-02-26 DIAGNOSIS — I70222 Atherosclerosis of native arteries of extremities with rest pain, left leg: Secondary | ICD-10-CM | POA: Diagnosis not present

## 2024-02-26 NOTE — Telephone Encounter (Signed)
 Patient calling again this morning to state that he needs another xray and that his leg is still hurting really bad.  Wanted to know if Dr Randie Heinz was in the office. This nurse advised that Dr Randie Heinz will be made aware of his pain and we will allow Dr. Randie Heinz to instruct [place] on next steps.  Patient acknowledged understanding.

## 2024-02-27 ENCOUNTER — Telehealth: Payer: Self-pay

## 2024-02-27 ENCOUNTER — Encounter: Payer: 59 | Admitting: Student

## 2024-02-27 ENCOUNTER — Telehealth: Payer: Self-pay | Admitting: Cardiology

## 2024-02-27 NOTE — Telephone Encounter (Signed)
 Patient stated he had a stent placed recently, but was needing a procedure on his vein. Patient stated they are in a lot of pain. Patient would like Dr. Allyson Sabal to perform a procedure on him because Dr. Allyson Sabal has worked on him in the past. Please advise.

## 2024-02-27 NOTE — Telephone Encounter (Signed)
 Left the pt a message to call the office back.  Looks like Vascular Surgery is following the pts vascular issues and any interventions needed.

## 2024-02-27 NOTE — Telephone Encounter (Signed)
 Advice: -pt arrived in office this am stating he wanted to see Dr. Randie Heinz because his is having pain in his leg and it's all night long. -this nurse told him the pain is likely due to having blood flow now.  -He states he wants the stent taken out because "its not agreeing with him"; that "this is not my first rodeo" and he has had 3 stents before.  He pointed to the side of his foot stating the pain is shooting all the way down there.  No swelling noted at exposed ankle. -pt said he was calling Dr. Allyson Sabal and began talking on his phone and walked outside.

## 2024-03-03 NOTE — Telephone Encounter (Signed)
 Left message to call back

## 2024-03-04 ENCOUNTER — Telehealth: Payer: Self-pay | Admitting: Student

## 2024-03-04 ENCOUNTER — Ambulatory Visit: Admitting: Student

## 2024-03-04 VITALS — BP 114/69 | HR 73 | Temp 98.2°F | Ht 68.0 in | Wt 175.3 lb

## 2024-03-04 DIAGNOSIS — Z794 Long term (current) use of insulin: Secondary | ICD-10-CM

## 2024-03-04 DIAGNOSIS — I10 Essential (primary) hypertension: Secondary | ICD-10-CM | POA: Diagnosis not present

## 2024-03-04 DIAGNOSIS — Z1211 Encounter for screening for malignant neoplasm of colon: Secondary | ICD-10-CM

## 2024-03-04 DIAGNOSIS — E785 Hyperlipidemia, unspecified: Secondary | ICD-10-CM

## 2024-03-04 DIAGNOSIS — E1151 Type 2 diabetes mellitus with diabetic peripheral angiopathy without gangrene: Secondary | ICD-10-CM

## 2024-03-04 DIAGNOSIS — I739 Peripheral vascular disease, unspecified: Secondary | ICD-10-CM

## 2024-03-04 DIAGNOSIS — E1169 Type 2 diabetes mellitus with other specified complication: Secondary | ICD-10-CM | POA: Diagnosis not present

## 2024-03-04 DIAGNOSIS — Z7984 Long term (current) use of oral hypoglycemic drugs: Secondary | ICD-10-CM

## 2024-03-04 DIAGNOSIS — E1140 Type 2 diabetes mellitus with diabetic neuropathy, unspecified: Secondary | ICD-10-CM | POA: Diagnosis not present

## 2024-03-04 LAB — GLUCOSE, CAPILLARY: Glucose-Capillary: 249 mg/dL — ABNORMAL HIGH (ref 70–99)

## 2024-03-04 LAB — POCT GLYCOSYLATED HEMOGLOBIN (HGB A1C): Hemoglobin A1C: 7.9 % — AB (ref 4.0–5.6)

## 2024-03-04 MED ORDER — LANCET DEVICES MISC: 1.0000 | Freq: Two times a day (BID) | 11 refills | Status: DC

## 2024-03-04 MED ORDER — PEN NEEDLES 32G X 4 MM MISC
3 refills | Status: AC
Start: 2024-03-04 — End: ?

## 2024-03-04 MED ORDER — PIOGLITAZONE HCL 30 MG PO TABS: 30.0000 mg | ORAL_TABLET | Freq: Every day | ORAL | 11 refills | Status: DC

## 2024-03-04 NOTE — Progress Notes (Signed)
   CC: Diabetes and HTN check  HPI:  [name] is a 63 y.o. male with a PMH stated below who presents today for checkup.  Please see problem based assessment and plan for additional details.  Past Medical History:  Diagnosis Date   Back pain, chronic    Coronary artery disease    Diabetes mellitus 2007   Hepatitis C    [id number]   History of chronic hepatitis C 05/13/2012   First noted 2013. RF inc tattoos obtained while incarcerated. [place] 2016 SVR 2017    Hypertension goal BP (blood pressure) < 140/80    Microcytic anemia    Neuromuscular disorder (HCC)    PAD (peripheral artery disease) (HCC)    Pancreatitis 01/09/2012    Review of Systems: ROS negative except for what is noted on the assessment and plan.  Vitals:   03/04/24 0922  BP: 114/69  Pulse: 73  Temp: 98.2 F (36.8 C)  TempSrc: Oral  SpO2: 100%  Weight: 175 lb 4.8 oz (79.5 kg)  Height: 5\' 8"  (1.727 m)    Physical Exam: Constitutional: well-appearing man in no acute distress HENT: normocephalic atraumatic, mucous membranes moist Eyes: conjunctiva non-erythematous Cardiovascular: regular rate and rhythm, no m/r/g Pulmonary/Chest: normal work of breathing on room air, lungs clear to auscultation bilaterally Abdominal: soft, non-tender, non-distended MSK: normal bulk and tone Neurological: alert & oriented x 3, no focal deficit Skin: warm and dry Psych: normal mood and behavior  Assessment & Plan:   Patient discussed with Dr. Cleda Daub  Type 2 diabetes mellitus with diabetic neuropathy (HCC) Here for 1 month follow up of diabetes. As it turns out, though he is prescribed 40 of insulin degludec daily, he only takes it as needed when his sugars are above 250. This amounts to no more than 2 doses per week. Otherwise he takes no diabetes medicines. He took actos in the past and would like to restart that and stop insulin. Metformin caused GI upset. He is not sure about SGTL2s/GLP1s at this point,  would prefer Actos. - A1c improved today despite irregular insulin. A1c is 7.9 down from 8.6. - Stop insulin 40 prn (supposed to be daily). Start Actos 30 every day. - RTC A1c in 3 months. Can consider addition of SGTL2 vs GLP1 at that point. He failed metformin due to GI upset. - ACR today - Ref for eye exam   Hypertension At goal. 114/69. He stopped losartan last year. - Continue management per lifestyle.  PAD (peripheral artery disease) (HCC) He has L leg vascular surgery earlier this week. It went well. He has strong distal pulses and only mild pain. - Continue plavix 75 every day, eliquis 5 BID, atorvastatin 80 qd - Lipid panel today  RTC in 3 months for diabetes  Donald Palmer, D.O. [facility] Internal Medicine, PGY-1 Phone: [phone] Date 03/04/2024 Time 11:58 AM

## 2024-03-04 NOTE — Telephone Encounter (Signed)
 Called patient to inform him that his driver's license is at our office at the front desk. No answer, but was able to leave detailed message.

## 2024-03-04 NOTE — Assessment & Plan Note (Signed)
 He has L leg vascular surgery earlier this week. It went well. He has strong distal pulses and only mild pain. - Continue plavix 75 every day, eliquis 5 BID, atorvastatin 80 qd - Lipid panel today

## 2024-03-04 NOTE — Assessment & Plan Note (Signed)
 At goal. 114/69. He stopped losartan last year. - Continue management per lifestyle.

## 2024-03-04 NOTE — Assessment & Plan Note (Addendum)
 Here for 1 month follow up of diabetes. As it turns out, though he is prescribed 40 of insulin degludec daily, he only takes it as needed when his sugars are above 250. This amounts to no more than 2 doses per week. Otherwise he takes no diabetes medicines. He took actos in the past and would like to restart that and stop insulin. Metformin caused GI upset. He is not sure about SGTL2s/GLP1s at this point, would prefer Actos. - A1c improved today despite irregular insulin. A1c is 7.9 down from 8.6. - Stop insulin 40 prn (supposed to be daily). Start Actos 30 every day. - RTC A1c in 3 months. Can consider addition of SGTL2 vs GLP1 at that point. He failed metformin due to GI upset. - ACR today - Ref for eye exam

## 2024-03-04 NOTE — Patient Instructions (Addendum)
 Stop insulin  Start actos 30mg  daily  Please return in 3 months

## 2024-03-05 LAB — LIPID PANEL
Chol/HDL Ratio: 4 ratio (ref 0.0–5.0)
Cholesterol, Total: 171 mg/dL (ref 100–199)
HDL: 43 mg/dL (ref >39)
LDL Chol Calc (NIH): 93 mg/dL (ref 0–99)
Triglycerides: 206 mg/dL — ABNORMAL HIGH (ref 0–149)
VLDL Cholesterol Cal: 35 mg/dL (ref 5–40)

## 2024-03-05 LAB — MICROALBUMIN / CREATININE URINE RATIO
Creatinine, Urine: 321.3 mg/dL
Microalb/Creat Ratio: 11 mg/g{creat} (ref 0–29)
Microalbumin, Urine: 35.7 ug/mL

## 2024-03-05 NOTE — Telephone Encounter (Signed)
 Pt contacted and he reports that he is better now and does not need anything from [place] at this time.

## 2024-03-06 ENCOUNTER — Telehealth: Payer: Self-pay

## 2024-03-06 NOTE — Telephone Encounter (Signed)
 I called pt-no answer left message to call the clinic back. I wanted to inform pt that his disability parking placard form has been completed and ready for pick up. Form will be left at the front desk.

## 2024-03-13 NOTE — Progress Notes (Signed)
 Internal Medicine Clinic Attending  Case discussed with the resident at the time of the visit.  We reviewed the resident's history and exam and pertinent patient test results.  I agree with the assessment, diagnosis, and plan of care documented in the resident's note.

## 2024-03-13 NOTE — Addendum Note (Signed)
 Addended by: Gust Rung on: 03/13/2024 09:38 PM   Modules accepted: Level of Service

## 2024-03-13 NOTE — Addendum Note (Signed)
 Addended by: Gust Rung on: 03/13/2024 09:41 PM   Modules accepted: Level of Service

## 2024-03-17 ENCOUNTER — Ambulatory Visit: Payer: Medicare HMO | Admitting: Cardiology

## 2024-03-17 ENCOUNTER — Emergency Department (HOSPITAL_COMMUNITY)

## 2024-03-17 ENCOUNTER — Other Ambulatory Visit: Payer: Self-pay

## 2024-03-17 ENCOUNTER — Inpatient Hospital Stay (HOSPITAL_COMMUNITY)
Admission: EM | Admit: 2024-03-17 | Discharge: 2024-03-20 | DRG: 271 | Disposition: A | Discharge status: Home / Self Care | Attending: Vascular Surgery | Admitting: Vascular Surgery

## 2024-03-17 ENCOUNTER — Encounter (HOSPITAL_COMMUNITY): Payer: Self-pay | Admitting: Emergency Medicine

## 2024-03-17 DIAGNOSIS — Z79899 Other long term (current) drug therapy: Secondary | ICD-10-CM

## 2024-03-17 DIAGNOSIS — E1151 Type 2 diabetes mellitus with diabetic peripheral angiopathy without gangrene: Secondary | ICD-10-CM | POA: Diagnosis present

## 2024-03-17 DIAGNOSIS — I998 Other disorder of circulatory system: Secondary | ICD-10-CM | POA: Diagnosis present

## 2024-03-17 DIAGNOSIS — Z888 Allergy status to other drugs, medicaments and biological substances status: Secondary | ICD-10-CM | POA: Diagnosis not present

## 2024-03-17 DIAGNOSIS — Z833 Family history of diabetes mellitus: Secondary | ICD-10-CM | POA: Diagnosis not present

## 2024-03-17 DIAGNOSIS — Z83438 Family history of other disorder of lipoprotein metabolism and other lipidemia: Secondary | ICD-10-CM | POA: Diagnosis not present

## 2024-03-17 DIAGNOSIS — T82868A Thrombosis of vascular prosthetic devices, implants and grafts, initial encounter: Principal | ICD-10-CM | POA: Diagnosis present

## 2024-03-17 DIAGNOSIS — F17210 Nicotine dependence, cigarettes, uncomplicated: Secondary | ICD-10-CM | POA: Diagnosis present

## 2024-03-17 DIAGNOSIS — Z91128 Patient's intentional underdosing of medication regimen for other reason: Secondary | ICD-10-CM | POA: Diagnosis not present

## 2024-03-17 DIAGNOSIS — Z95828 Presence of other vascular implants and grafts: Secondary | ICD-10-CM | POA: Diagnosis not present

## 2024-03-17 DIAGNOSIS — M79605 Pain in left leg: Secondary | ICD-10-CM | POA: Diagnosis present

## 2024-03-17 DIAGNOSIS — Z89021 Acquired absence of right finger(s): Secondary | ICD-10-CM | POA: Diagnosis not present

## 2024-03-17 DIAGNOSIS — I10 Essential (primary) hypertension: Secondary | ICD-10-CM | POA: Diagnosis present

## 2024-03-17 DIAGNOSIS — R200 Anesthesia of skin: Secondary | ICD-10-CM | POA: Diagnosis present

## 2024-03-17 DIAGNOSIS — Z7984 Long term (current) use of oral hypoglycemic drugs: Secondary | ICD-10-CM

## 2024-03-17 DIAGNOSIS — Y838 Other surgical procedures as the cause of abnormal reaction of the patient, or of later complication, without mention of misadventure at the time of the procedure: Secondary | ICD-10-CM | POA: Diagnosis present

## 2024-03-17 DIAGNOSIS — Z7902 Long term (current) use of antithrombotics/antiplatelets: Secondary | ICD-10-CM

## 2024-03-17 DIAGNOSIS — Z8619 Personal history of other infectious and parasitic diseases: Secondary | ICD-10-CM

## 2024-03-17 DIAGNOSIS — Z7901 Long term (current) use of anticoagulants: Secondary | ICD-10-CM | POA: Diagnosis not present

## 2024-03-17 DIAGNOSIS — Z9889 Other specified postprocedural states: Secondary | ICD-10-CM | POA: Diagnosis not present

## 2024-03-17 DIAGNOSIS — Z825 Family history of asthma and other chronic lower respiratory diseases: Secondary | ICD-10-CM | POA: Diagnosis not present

## 2024-03-17 DIAGNOSIS — Z951 Presence of aortocoronary bypass graft: Secondary | ICD-10-CM | POA: Diagnosis not present

## 2024-03-17 DIAGNOSIS — I700 Atherosclerosis of aorta: Secondary | ICD-10-CM | POA: Diagnosis present

## 2024-03-17 DIAGNOSIS — Z23 Encounter for immunization: Secondary | ICD-10-CM

## 2024-03-17 DIAGNOSIS — I70222 Atherosclerosis of native arteries of extremities with rest pain, left leg: Secondary | ICD-10-CM | POA: Diagnosis present

## 2024-03-17 DIAGNOSIS — I743 Embolism and thrombosis of arteries of the lower extremities: Secondary | ICD-10-CM | POA: Diagnosis present

## 2024-03-17 DIAGNOSIS — I2510 Atherosclerotic heart disease of native coronary artery without angina pectoris: Secondary | ICD-10-CM | POA: Diagnosis present

## 2024-03-17 DIAGNOSIS — Z8249 Family history of ischemic heart disease and other diseases of the circulatory system: Secondary | ICD-10-CM

## 2024-03-17 DIAGNOSIS — Z5941 Food insecurity: Secondary | ICD-10-CM | POA: Diagnosis not present

## 2024-03-17 DIAGNOSIS — Z5986 Financial insecurity: Secondary | ICD-10-CM

## 2024-03-17 LAB — CBC WITH DIFFERENTIAL/PLATELET
Abs Immature Granulocytes: 0.01 10*3/uL (ref 0.00–0.07)
Basophils Absolute: 0.1 10*3/uL (ref 0.0–0.1)
Basophils Relative: 1 %
Eosinophils Absolute: 0.1 10*3/uL (ref 0.0–0.5)
Eosinophils Relative: 1 %
HCT: 42.7 % (ref 39.0–52.0)
Hemoglobin: 13.2 g/dL (ref 13.0–17.0)
Immature Granulocytes: 0 %
Lymphocytes Relative: 46 %
Lymphs Abs: 3.4 10*3/uL (ref 0.7–4.0)
MCH: 21.6 pg — ABNORMAL LOW (ref 26.0–34.0)
MCHC: 30.9 g/dL (ref 30.0–36.0)
MCV: 70 fL — ABNORMAL LOW (ref 80.0–100.0)
Monocytes Absolute: 0.9 10*3/uL (ref 0.1–1.0)
Monocytes Relative: 12 %
Neutro Abs: 2.9 10*3/uL (ref 1.7–7.7)
Neutrophils Relative %: 40 %
Platelets: 323 10*3/uL (ref 150–400)
RBC: 6.1 MIL/uL — ABNORMAL HIGH (ref 4.22–5.81)
RDW: 16.1 % — ABNORMAL HIGH (ref 11.5–15.5)
WBC: 7.3 10*3/uL (ref 4.0–10.5)
nRBC: 0 % (ref 0.0–0.2)

## 2024-03-17 LAB — BASIC METABOLIC PANEL WITH GFR
Anion gap: 11 (ref 5–15)
BUN: 10 mg/dL (ref 8–23)
CO2: 24 mmol/L (ref 22–32)
Calcium: 10.5 mg/dL — ABNORMAL HIGH (ref 8.9–10.3)
Chloride: 102 mmol/L (ref 98–111)
Creatinine, Ser: 1.31 mg/dL — ABNORMAL HIGH (ref 0.61–1.24)
GFR, Estimated: >60 mL/min (ref >60)
Glucose, Bld: 174 mg/dL — ABNORMAL HIGH (ref 70–99)
Potassium: 4.5 mmol/L (ref 3.5–5.1)
Sodium: 137 mmol/L (ref 135–145)

## 2024-03-17 LAB — HEPARIN LEVEL (UNFRACTIONATED): Heparin Unfractionated: >1.1 [IU]/mL — ABNORMAL HIGH (ref 0.30–0.70)

## 2024-03-17 LAB — APTT: aPTT: 28 s (ref 24–36)

## 2024-03-17 MED ORDER — HEPARIN (PORCINE) 25000 UT/250ML-% IV SOLN
1500.0000 [IU]/h | INTRAVENOUS | Status: DC
  Administered 2024-03-17: 1400 [IU]/h via INTRAVENOUS
  Administered 2024-03-18: 1500 [IU]/h via INTRAVENOUS
  Filled 2024-03-17 (×2): qty 250

## 2024-03-17 MED ORDER — HYDROMORPHONE HCL 1 MG/ML IJ SOLN
1.0000 mg | Freq: Once | INTRAMUSCULAR | Status: AC
  Administered 2024-03-17: 1 mg via INTRAVENOUS
  Filled 2024-03-17: qty 1

## 2024-03-17 MED ORDER — HEPARIN BOLUS VIA INFUSION
4500.0000 [IU] | Freq: Once | INTRAVENOUS | Status: AC
  Administered 2024-03-17: 4500 [IU] via INTRAVENOUS
  Filled 2024-03-17: qty 4500

## 2024-03-17 MED ORDER — IOHEXOL 350 MG/ML SOLN
100.0000 mL | Freq: Once | INTRAVENOUS | Status: AC | PRN
  Administered 2024-03-17: 100 mL via INTRAVENOUS

## 2024-03-17 MED ORDER — ONDANSETRON HCL 4 MG/2ML IJ SOLN
4.0000 mg | Freq: Once | INTRAMUSCULAR | Status: AC
  Administered 2024-03-17: 4 mg via INTRAVENOUS
  Filled 2024-03-17: qty 2

## 2024-03-17 MED ORDER — FENTANYL CITRATE PF 50 MCG/ML IJ SOSY
100.0000 ug | PREFILLED_SYRINGE | Freq: Once | INTRAMUSCULAR | Status: AC
  Administered 2024-03-17: 100 ug via INTRAVENOUS
  Filled 2024-03-17: qty 2

## 2024-03-17 NOTE — H&P (Signed)
 H+P   Reason for Consult:  left leg pain Referring Physician:  Dr. Wilkie Aye MRN #:  [id number]  History of Present Illness: This is a 63 y.o. male recent left SFA stenting for rest pain with previous ABI around 0.5.  Following intervention patient was resumed on his home dose Eliquis and initiated on Plavix.  He followed up 1 day after the procedure with reperfusion pain with notable palpable pulses distally in the left lower extremity.  He just returned from a trip to [place] with his family and states that at that time his leg was feeling fine although he was not able to walk is much as the rest of the family and he was not taking his Eliquis or Plavix.  This morning he began having pain and around that time he took a double dose of Eliquis.  He began having weakness and numbness and subsequently called EMS for transportation to the hospital.  States that since being at the hospital heparin drip has been initiated the weakness has mostly resolved but he does have persistent numbness although improved and the pain is also improved.  He is not having any right lower extremity issues.  Past Medical History:  Diagnosis Date   Back pain, chronic    Coronary artery disease    Diabetes mellitus 2007   Hepatitis C    [id number]   History of chronic hepatitis C 05/13/2012   First noted 2013. RF inc tattoos obtained while incarcerated. [place] 2016 SVR 2017    Hypertension goal BP (blood pressure) < 140/80    Microcytic anemia    Neuromuscular disorder (HCC)    PAD (peripheral artery disease) (HCC)    Pancreatitis 01/09/2012    Past Surgical History:  Procedure Laterality Date   ABDOMINAL AORTOGRAM N/A 03/03/2018   Procedure: ABDOMINAL AORTOGRAM;  Surgeon: Runell Gess, MD;  [facility]: [facility];  Service: Cardiovascular;  Laterality: N/A;   ABDOMINAL AORTOGRAM W/LOWER EXTREMITY N/A 06/15/2019   Procedure: ABDOMINAL AORTOGRAM W/LOWER EXTREMITY;  Surgeon: Runell Gess, MD;   Location: [facility];  Service: Cardiovascular;  Laterality: N/A;   ABDOMINAL AORTOGRAM W/LOWER EXTREMITY N/A 09/09/2023   Procedure: ABDOMINAL AORTOGRAM W/LOWER EXTREMITY;  Surgeon: Maeola Harman, MD;  Location[facility];  Service: Cardiovascular;  Laterality: N/A;   ABDOMINAL AORTOGRAM W/LOWER EXTREMITY N/A 02/24/2024   Procedure: ABDOMINAL AORTOGRAM W/LOWER EXTREMITY;  Surgeon: Maeola Harman, MD;  Location: [facility] INVASIVE CV LAB;  Service: Cardiovascular;  Laterality: N/A;   CORONARY ARTERY BYPASS GRAFT N/A 11/13/2023   Procedure: CORONARY ARTERY BYPASS GRAFTING X 3, USING LEFT INTERNAL MAMMARY ARTERY AND ENDOSCOPICALLY HARVESTED RIGHT SAPHENOUS VEIN GRAFT;  Surgeon: Corliss Skains, MD;  Location: [facility] OR;  Service: Open Heart Surgery;  Laterality: N/A;   I & D EXTREMITY Right 05/15/2016   Procedure: RIGHT INDEX FINGER FIRST THROUGH  MIDDLE PHALYNX  AMPUTATION;  Surgeon: Mack Hook, MD;  Location: [facility] OR;  Service: Orthopedics;  Laterality: Right;   LEFT HEART CATH AND CORONARY ANGIOGRAPHY N/A 10/16/2023   Procedure: LEFT HEART CATH AND CORONARY ANGIOGRAPHY;  Surgeon: Elder Negus, MD;  Location: [facility];  Service: Cardiovascular;  Laterality: N/A;   LOWER EXTREMITY INTERVENTION Bilateral 03/03/2018   Procedure: LOWER EXTREMITY INTERVENTION;  Surgeon: Runell Gess, MD;  Location: [facility];  Service: Cardiovascular;  Laterality: Bilateral;   LOWER EXTREMITY INTERVENTION  02/24/2024   Procedure: LOWER EXTREMITY INTERVENTION;  Surgeon: Maeola Harman,  MD;  [facility]: [facility];  Service: Cardiovascular;;   OPEN REDUCTION INTERNAL FIXATION (ORIF) DISTAL PHALANX Right 04/02/2016   Procedure: RIGHT INDEX FINGER REPAIR;  Surgeon: Mack Hook, MD;  Location: [facility];  Service: Orthopedics;  Laterality: Right;   PERIPHERAL VASCULAR ATHERECTOMY Left 03/03/2018   Procedure: PERIPHERAL VASCULAR  ATHERECTOMY;  Surgeon: Runell Gess, MD;  Location: [facility] INVASIVE CV LAB;  Service: Cardiovascular;  Laterality: Left;  SFA WITH PTA DRUG COATED BALLOON   PERIPHERAL VASCULAR INTERVENTION Left 06/15/2019   Procedure: PERIPHERAL VASCULAR INTERVENTION;  Surgeon: Runell Gess, MD;  [facility];  Service: Cardiovascular;  Laterality: Left;   TEE WITHOUT CARDIOVERSION N/A 11/13/2023   Procedure: TRANSESOPHAGEAL ECHOCARDIOGRAM;  Surgeon: Corliss Skains, MD;  Location: [facility] OR;  Service: Open Heart Surgery;  Laterality: N/A;    Allergies  Allergen Reactions   Metformin And Related Other (See Comments)    GI side effects. Stopped 2016    Prior to Admission medications   Medication Sig Start Date End Date Taking? Authorizing Provider  Accu-Chek Softclix Lancets lancets check blood sugar 2 times a day 06/05/21   Elige Radon, MD  acetaminophen (TYLENOL) 325 MG tablet Take 2 tablets (650 mg total) by mouth every 6 (six) hours. 11/18/23   Gold, Glenice Laine, PA-C  apixaban (ELIQUIS) 5 MG TABS tablet Take 1 tablet (5 mg total) by mouth 2 (two) times daily. 02/24/24   Maeola Harman, MD  atorvastatin (LIPITOR) 80 MG tablet Take 1 tablet (80 mg total) by mouth daily at 6 PM. 01/30/24   Annett Fabian, MD  blood glucose meter kit and supplies KIT Use to check glucose twice a day 10/08/19   Burns Spain, MD  Blood Glucose Monitoring Suppl (ACCU-CHEK GUIDE) w/Device KIT Check blood sugar 2 times a day 06/05/21   Elige Radon, MD  Blood Glucose Monitoring Suppl DEVI 1 each by Does not apply route in the morning, at noon, and at bedtime. May substitute to any manufacturer covered by patient's insurance. 01/30/24   Annett Fabian, MD  clopidogrel (PLAVIX) 75 MG tablet Take 1 tablet (75 mg total) by mouth daily. 01/30/24   Annett Fabian, MD  colchicine 0.6 MG tablet Take 1 tablet (0.6 mg total) by mouth daily as needed (gout flare). 01/30/24   Annett Fabian, MD   fluticasone [facility]) 50 MCG/ACT nasal spray USE 2 SPRAYS IN [facility] NOSTRIL DAILY 06/05/21   Elige Radon, MD  Glucose Blood (BLOOD GLUCOSE TEST STRIPS) STRP 1 each by In Vitro route in the morning, at noon, and at bedtime. May substitute to any manufacturer covered by patient's insurance. 01/30/24 02/29/24  Annett Fabian, MD  Insulin Pen Needle (PEN NEEDLES) 32G X 4 MM MISC Use to inject liraglutide once a day 03/04/24   Katheran James, DO  Lancet Devices MISC 1 each by Does not apply route 2 (two) times daily. Please use to check blood sugar 2 times daily. diag code E11.40. noninsulin dependent 03/04/24   Katheran James, DO  metoprolol tartrate (LOPRESSOR) 25 MG tablet Take 1 tablet (25 mg total) by mouth 2 (two) times daily. 01/30/24   Annett Fabian, MD  omeprazole (PRILOSEC) 40 MG capsule Take 1 capsule (40 mg total) by mouth daily. 01/30/24   Annett Fabian, MD  oxyCODONE-acetaminophen (PERCOCET/ROXICET) 5-325 MG tablet Take 1 tablet by mouth every 6 (six) hours as needed for severe pain (pain score 7-10). 02/25/24   Eber Hong, MD  pioglitazone (ACTOS) 30  MG tablet Take 1 tablet (30 mg total) by mouth daily. 03/04/24 03/04/25  Katheran James, DO  Specialty Vitamins Products (NERVIVE NERVE RELIEF PO) Take 1 tablet by mouth daily.    [provider]  tadalafil (CIALIS) 10 MG tablet Take 1 tablet (10 mg total) by mouth as needed for erectile dysfunction. 01/30/24 01/29/25  Annett Fabian, MD    Social History   Socioeconomic History   Marital status: Single    Spouse name: Not on file   Number of children: 4   Years of education: Not on file   Highest education level: 12th grade  Occupational History   Not on file  Tobacco Use   Smoking status: Some Days    Current packs/day: 0.10    Average packs/day: 0.1 packs/day for 30.0 years (3.0 ttl pk-yrs)    Types: Cigarettes, Cigars   Smokeless tobacco: Never   Tobacco comments:    Per pt, 3 cigarettes per day, as of  11/11/23  Vaping Use   Vaping status: Never Used  Substance and Sexual Activity   Alcohol use: Not Currently    Alcohol/week: 5.0 standard drinks of alcohol    Types: 5 Cans of beer per week    Comment: Beer.   Drug use: Not Currently    Frequency: 1.0 times per week    Types: Marijuana    Comment: rarely   Sexual activity: Yes  Other Topics Concern   Not on file  Social History Narrative   Current Social History 08/15/2021        Patient lives with his daughter in a home which is 1 story. There are 8 steps up to the entrance the patient uses with a railing      Patient's method of transportation is personal car.      The highest level of education was some high school.      The patient currently disabled.      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"       Current Stressors: "money"       Religious / Personal Beliefs: "no"       Social Drivers of Corporate investment banker Strain: Medium Risk (01/06/2024)   Overall Financial Resource Strain (CARDIA)    Difficulty of Paying Living Expenses: Somewhat hard  Food Insecurity: Food Insecurity Present (01/06/2024)   Hunger Vital Sign    Worried About Running Out of Food in the Last Year: Sometimes true    Ran Out of Food in the Last Year: Never true  Transportation Needs: No Transportation Needs (01/06/2024)   PRAPARE - Administrator, Civil Service (Medical): No    Lack of Transportation (Non-Medical): No  Physical Activity: Not on file  Stress: Not on file  Social Connections: Not on file  Intimate Partner Violence: Not At Risk (11/15/2023)   Humiliation, Afraid, Rape, and Kick questionnaire    Fear of Current or Ex-Partner: No    Emotionally Abused: No    Physically Abused: No    Sexually Abused: No     Family History  Problem Relation Age of Onset   Diabetes Mother    Hypertension Mother    Hyperlipidemia  Mother    Asthma Sister    Obesity Sister    Diabetes Brother    Diabetes Brother    Diabetes Daughter  Heart attack Neg Hx    Sudden death Neg Hx     Review of Systems  Constitutional: Negative.   HENT: Negative.    Respiratory: Negative.    Cardiovascular: Negative.   Musculoskeletal:        Leg pain  Skin: Negative.   Neurological:  Positive for sensory change and focal weakness.  Endo/Heme/Allergies: Negative.   Psychiatric/Behavioral: Negative.        Physical Examination  Vitals:   03/17/24 1925  BP: 127/89  Pulse: 87  Resp: (!) 22  Temp: 97.8 F (36.6 C)  SpO2: 100%   Body mass index is 26.65 kg/m.  Physical Exam HENT:     Head: Normocephalic.     Nose: Nose normal.  Eyes:     Pupils: Pupils are equal, round, and reactive to light.  Cardiovascular:     Pulses:          Femoral pulses are 2+ on the right side and 2+ on the left side.      Posterior tibial pulses are detected w/ Doppler on the right side.     Comments: Very faint left posterior tibial signal detected with Doppler Pulmonary:     Effort: Pulmonary effort is normal.  Abdominal:     General: Abdomen is flat.  Musculoskeletal:     Right lower leg: No edema.     Left lower leg: No edema.  Skin:    Coloration: Skin is pale.  Neurological:     General: No focal deficit present.     Mental Status: He is alert.     Motor: Weakness present.     Comments: Sensation is decreased to the left foot      CBC    Component Value Date/Time   WBC 7.3 03/17/2024 1912   RBC 6.10 (H) 03/17/2024 1912   HGB 13.2 03/17/2024 1912   HGB 11.0 (L) 12/02/2023 1442   HCT 42.7 03/17/2024 1912   HCT 35.7 (L) 12/02/2023 1442   PLT 323 03/17/2024 1912   PLT 728 (H) 12/02/2023 1442   MCV 70.0 (L) 03/17/2024 1912   MCV 69 (L) 12/02/2023 1442   MCH 21.6 (L) 03/17/2024 1912   MCHC 30.9 03/17/2024 1912   RDW 16.1 (H) 03/17/2024 1912   RDW 13.7 12/02/2023 1442   LYMPHSABS 3.4 03/17/2024 1912    LYMPHSABS 2.7 10/11/2023 1307   MONOABS 0.9 03/17/2024 1912   EOSABS 0.1 03/17/2024 1912   EOSABS 0.2 10/11/2023 1307   BASOSABS 0.1 03/17/2024 1912   BASOSABS 0.1 10/11/2023 1307    BMET    Component Value Date/Time   NA 137 03/17/2024 1912   NA 142 12/02/2023 1442   K 4.5 03/17/2024 1912   CL 102 03/17/2024 1912   CO2 24 03/17/2024 1912   GLUCOSE 174 (H) 03/17/2024 1912   BUN 10 03/17/2024 1912   BUN 7 (L) 12/02/2023 1442   CREATININE 1.31 (H) 03/17/2024 1912   CREATININE 0.77 10/28/2014 1112   CALCIUM 10.5 (H) 03/17/2024 1912   GFRNONAA >60 03/17/2024 1912   GFRNONAA >89 10/28/2014 1112   GFRAA 87 04/28/2020 0944   GFRAA >89 10/28/2014 1112    COAGS: Lab Results  Component Value Date   INR 1.2 11/13/2023   INR 1.0 11/11/2023   INR 1.0 02/19/2018     Non-Invasive Vascular Imaging:   CTA  IMPRESSION: 1. Complete occlusion of the left superficial femoral artery stent throughout its length. Reconstitution of the popliteal artery. 2. Severe narrowing of  the distal right superficial femoral artery. 3. Three-vessel runoff to the level of the distal tibial metadiaphysis on the left. The distal calf arteries are not well opacified likely due to slow flow. 4. Aortic Atherosclerosis (ICD10-I70.0).  ASSESSMENT/PLAN: 63 year old male status post stenting of his left SFA for rest pain that subsequently resolved with palpable pulses.  He has now been off of his Plavix and Eliquis and began having significant pain at rest again today with numbness and weakness of the left foot.  He then took a double dose of his 5 mg Eliquis and with no resolution in pain and weakness he called EMS.  He now has been initiated on a heparin drip and will weakness mostly resolved with some residual numbness and pain significantly improved.  He now appears to have [place] 2A ischemia with occluded SFA stents by CTA with weak distal left posterior tibial signal.  Plan will be for angiography from the  right common femoral approach with possible mechanical thrombectomy versus planning operative intervention.  This was discussed with the patient he demonstrates good understanding.  Any change in his clinical course would require operative intervention tonight but given the recent 10 mg administration of Eliquis I would like to wait for washout prior to any open procedures unless absolutely necessary.   Donald Vassel C. Randie Heinz, MD [facility] Office: [phone] Pager: [phone]

## 2024-03-17 NOTE — ED Triage Notes (Signed)
 Pt BIB EMS from home for L leg pain, pt had a vascular surgery on 3/18. Woke up today with increased calf tenderness and inability to bear weight. EMS unable to palpate pulse in L foot, foot cool to touch.

## 2024-03-17 NOTE — ED Provider Notes (Signed)
 [place] EMERGENCY DEPARTMENT AT [address] [facility] Provider Note   CSN: [id number] Arrival date & time: 03/17/24  1903     History  Chief Complaint  Patient presents with   Leg Pain    Donald Palmer is a 63 y.o. male.  HPI   63 year old male here for acutely worsening LLE pain. H/o of PAS s/p stent with Dr. Randie Heinz 02/24/24. States he started having LLE pain this morning that originally eased off, but not acutely worsened.  States the pain is extending from his hip all the way down to his foot with some foot discoloration.  He states he is otherwise been compliant with his medications and no other acute changes.  Home Medications Prior to Admission medications   Medication Sig Start Date End Date Taking? Authorizing Provider  Accu-Chek Softclix Lancets lancets check blood sugar 2 times a day 06/05/21   Elige Radon, MD  acetaminophen (TYLENOL) 325 MG tablet Take 2 tablets (650 mg total) by mouth every 6 (six) hours. 11/18/23   Gold, Glenice Laine, PA-C  apixaban (ELIQUIS) 5 MG TABS tablet Take 1 tablet (5 mg total) by mouth 2 (two) times daily. 02/24/24   Maeola Harman, MD  atorvastatin (LIPITOR) 80 MG tablet Take 1 tablet (80 mg total) by mouth daily at 6 PM. 01/30/24   Annett Fabian, MD  blood glucose meter kit and supplies KIT Use to check glucose twice a day 10/08/19   Burns Spain, MD  Blood Glucose Monitoring Suppl (ACCU-CHEK GUIDE) w/Device KIT Check blood sugar 2 times a day 06/05/21   Elige Radon, MD  Blood Glucose Monitoring Suppl DEVI 1 each by Does not apply route in the morning, at noon, and at bedtime. May substitute to any manufacturer covered by patient's insurance. 01/30/24   Annett Fabian, MD  clopidogrel (PLAVIX) 75 MG tablet Take 1 tablet (75 mg total) by mouth daily. 01/30/24   Annett Fabian, MD  colchicine 0.6 MG tablet Take 1 tablet (0.6 mg total) by mouth daily as needed (gout flare). 01/30/24   Annett Fabian, MD  fluticasone [facility]) 50  MCG/ACT nasal spray USE 2 SPRAYS IN [facility] NOSTRIL DAILY 06/05/21   Elige Radon, MD  Glucose Blood (BLOOD GLUCOSE TEST STRIPS) STRP 1 each by In Vitro route in the morning, at noon, and at bedtime. May substitute to any manufacturer covered by patient's insurance. 01/30/24 02/29/24  Annett Fabian, MD  Insulin Pen Needle (PEN NEEDLES) 32G X 4 MM MISC Use to inject liraglutide once a day 03/04/24   Katheran James, DO  Lancet Devices MISC 1 each by Does not apply route 2 (two) times daily. Please use to check blood sugar 2 times daily. diag code E11.40. noninsulin dependent 03/04/24   Katheran James, DO  metoprolol tartrate (LOPRESSOR) 25 MG tablet Take 1 tablet (25 mg total) by mouth 2 (two) times daily. 01/30/24   Annett Fabian, MD  omeprazole (PRILOSEC) 40 MG capsule Take 1 capsule (40 mg total) by mouth daily. 01/30/24   Annett Fabian, MD  oxyCODONE-acetaminophen (PERCOCET/ROXICET) 5-325 MG tablet Take 1 tablet by mouth every 6 (six) hours as needed for severe pain (pain score 7-10). 02/25/24   Eber Hong, MD  pioglitazone (ACTOS) 30 MG tablet Take 1 tablet (30 mg total) by mouth daily. 03/04/24 03/04/25  Katheran James, DO  Specialty Vitamins Products (NERVIVE NERVE RELIEF PO) Take 1 tablet by mouth daily.    [provider]  tadalafil (CIALIS) 10 MG tablet Take 1  tablet (10 mg total) by mouth as needed for erectile dysfunction. 01/30/24 01/29/25  Annett Fabian, MD      Allergies    Metformin and related    Review of Systems   Review of Systems  Constitutional:  Negative for fever.  Respiratory:  Negative for shortness of breath.   Cardiovascular:  Negative for chest pain.  Gastrointestinal:  Negative for abdominal pain, diarrhea and vomiting.  Musculoskeletal:  Negative for back pain and neck pain.       + left leg pain  Skin:  Negative for rash.  Neurological:  Negative for headaches.    Physical Exam Updated Vital Signs BP 127/89 (BP Location: Right Arm)    Pulse 87   Temp 97.8 F (36.6 C) (Oral)   Resp (!) 22   Ht 5\' 8"  (1.727 m)   Wt 79.5 kg   SpO2 100%   BMI 26.65 kg/m  Physical Exam Vitals and nursing note reviewed.  Constitutional:      General: He is in acute distress.     Appearance: Normal appearance.  HENT:     Head: Normocephalic.     Mouth/Throat:     Mouth: Mucous membranes are moist.  Cardiovascular:     Rate and Rhythm: Normal rate.  Pulmonary:     Effort: Pulmonary effort is normal. No respiratory distress.  Abdominal:     Palpations: Abdomen is soft.     Tenderness: There is no abdominal tenderness.  Musculoskeletal:     Comments: Left foot is cool, discolored, delayed cap refill, no doppler pulse  Skin:    General: Skin is warm.  Neurological:     Mental Status: He is alert and oriented to person, place, and time. Mental status is at baseline.  Psychiatric:        Mood and Affect: Mood normal.    ED Results / Procedures / Treatments   Labs (all labs ordered are listed, but only abnormal results are displayed) Labs Reviewed  CBC WITH DIFFERENTIAL/PLATELET - Abnormal; Notable for the following components:      Result Value   RBC 6.10 (*)    MCV 70.0 (*)    MCH 21.6 (*)    RDW 16.1 (*)    All other components within normal limits  BASIC METABOLIC PANEL WITH GFR - Abnormal; Notable for the following components:   Glucose, Bld 174 (*)    Creatinine, Ser 1.31 (*)    Calcium 10.5 (*)    All other components within normal limits  HEPARIN LEVEL (UNFRACTIONATED) - Abnormal; Notable for the following components:   Heparin Unfractionated >1.10 (*)    All other components within normal limits  APTT  HEPARIN LEVEL (UNFRACTIONATED)  APTT    EKG None  Radiology No results found.  Procedures .Critical Care  Performed by: Rozelle Logan, DO Authorized by: Rozelle Logan, DO   Critical care provider statement:    Critical care time (minutes):  30   Critical care time was exclusive of:   Separately billable procedures and treating other patients   Critical care was necessary to treat or prevent imminent or life-threatening deterioration of the following conditions:  Circulatory failure   Critical care was time spent personally by me on the following activities:  Development of treatment plan with patient or surrogate, discussions with consultants, evaluation of patient's response to treatment, examination of patient, ordering and review of laboratory studies, ordering and review of radiographic studies, ordering and performing treatments and interventions, pulse  oximetry, re-evaluation of patient's condition and review of old charts   I assumed direction of critical care for this patient from another provider in my specialty: no     Care discussed with: admitting provider       Medications Ordered in ED Medications  heparin ADULT infusion 100 units/mL (25000 units/263mL) (1,400 Units/hr Intravenous New Bag/Given 03/17/24 2018)  HYDROmorphone (DILAUDID) injection 1 mg (1 mg Intravenous Given 03/17/24 1914)  ondansetron (ZOFRAN) injection 4 mg (4 mg Intravenous Given 03/17/24 1919)  HYDROmorphone (DILAUDID) injection 1 mg (1 mg Intravenous Given 03/17/24 1956)  heparin bolus via infusion 4,500 Units (4,500 Units Intravenous Bolus from Bag 03/17/24 2015)    ED Course/ Medical Decision Making/ A&P                                 Medical Decision Making Amount and/or Complexity of Data Reviewed Labs: ordered. Radiology: ordered.  Risk Prescription drug management. Decision regarding hospitalization.   63 yo male with recent LLE stenting with Dr. Randie Heinz 02/24/24 presents with acute LLE pain. Now admits to missed Eliquis doses due to traveling. Foot is cold, discolored, delayed cap refill and no doppler pulse. Vascular surgeon Dr. Randie Heinz consulted  2045: labs resulted, CT called and patient will be next.  CTA shows occluded left femoral stent, heparin started. Vascular aware and will  admit.  Patients evaluation and results requires admission for further treatment and care.  Spoke with hospitalist, reviewed patient's ED course and they accept admission.  Patient agrees with admission plan, offers no new complaints and is stable/unchanged at time of admit.        Final Clinical Impression(s) / ED Diagnoses Final diagnoses:  None    Rx / DC Orders ED Discharge Orders     None         Rozelle Logan, DO 03/17/24 2317

## 2024-03-17 NOTE — Progress Notes (Addendum)
 ANTICOAGULATION CONSULT NOTE  Pharmacy Consult for Heparin Indication:  ischemic leg  Allergies  Allergen Reactions   Metformin And Related Other (See Comments)    GI side effects. Stopped 2016    Patient Measurements: Height: 5\' 8"  (172.7 cm) Weight: 79.5 kg (175 lb 4.8 oz) IBW/kg (Calculated) : 68.4 Heparin Dosing Weight: 79.5 kg  Vital Signs: Temp: 97.8 F (36.6 C) (04/01 1925) Temp Source: Oral (04/01 1925) BP: 127/89 (04/01 1925) Pulse Rate: 87 (04/01 1925)  Labs: No results for input(s): "HGB", "HCT", "PLT", "APTT", "LABPROT", "INR", "HEPARINUNFRC", "HEPRLOWMOCWT", "CREATININE", "CKTOTAL", "CKMB", "TROPONINIHS" in the last 72 hours.  CrCl cannot be calculated (Patient's most recent lab result is older than the maximum 21 days allowed.).   Medical History: Past Medical History:  Diagnosis Date   Back pain, chronic    Coronary artery disease    Diabetes mellitus 2007   Hepatitis C    [id number]   History of chronic hepatitis C 05/13/2012   First noted 2013. RF inc tattoos obtained while incarcerated. Harvoni 2016 SVR 2017    Hypertension goal BP (blood pressure) < 140/80    Microcytic anemia    Neuromuscular disorder (HCC)    PAD (peripheral artery disease) (HCC)    Pancreatitis 01/09/2012    Medications:  (Not in a hospital admission)  Scheduled:  Infusions:  PRN:   Assessment: 16 yom with a history of CAD, DM, HTN, PAD, pancreatitis. Patient is presenting with L leg pain, pt had a vascular surgery on 3/18. EMS unable to palpate pule on left foot. Heparin per pharmacy consult placed for  ischemic leg .  Patient is on apixaban prior to arrival. Last dose 3/31 AM per patient. Will require aPTT monitoring due to likely falsely high anti-Xa level secondary to DOAC use.  Hgb pending; plt pending >>> Hgb 13.2; plt 323  Goal of Therapy:  Heparin level 0.3-0.7 units/ml aPTT 66-102 seconds Monitor platelets by anticoagulation protocol: Yes   Plan:  Give  4500 units IV heparin bolus Start heparin infusion at 1400 units/hr Check aPTT & anti-Xa level in 8 hours and daily while on heparin Continue to monitor via aPTT until levels are correlated Continue to monitor H&H and platelets  Delmar Landau, PharmD, BCPS 03/17/2024 7:42 PM ED Clinical Pharmacist -  [phone]

## 2024-03-17 NOTE — Progress Notes (Deleted)
  Cardiology Office Note:  .   Date:  03/17/2024  ID:  Donald Palmer, DOB 1961/10/18, MRN [id number] PCP: Modena Slater, DO  [facility] Providers Cardiologist:  Truett Mainland, MD PCP: Modena Slater, DO  No chief complaint on file.    Donald Palmer is a 63 y.o. male with hypertension, hyperlipidemia, PAD   Discussed the use of AI scribe software for clinical note transcription with the patient, who gave verbal consent to proceed.  History of Present Illness       There were no vitals filed for this visit.    ROS      Studies Reviewed: .        *** Independently interpreted ***/202***: Chol ***, TG ***, HDL ***, LDL *** HbA1C ***% Hb *** Cr *** ***  CABG 10/2023:: ***  Coronary angiography 10/16/2023: LM: Distal 40% stenosis of mild calcification LAD: Proximal-mid 80% calcific stenosis at trifurcation of LAD, diag, and septal perforator. Lcx: Ostial 80% stenosis before a high bifurcating OM1         OM2 proximal 80% disease RCA: Proximal focal 75% stenosis          Distal RCA occlusion          Left to right collaterals to distal RCA/PDA   LVEDP 16 mmHg LVEF 45-50% with severe inferior hypokinesis   Severe multivessel coronary artery disease  Risk Assessment/Calculations:   {Does this patient have ATRIAL FIBRILLATION?:[phone]}    Physical Exam   VISIT DIAGNOSES: No diagnosis found.   Donald Palmer is a 63 y.o. male with *** Assessment and Plan Assessment & Plan       {Are you ordering a CV Procedure (e.g. stress test, cath, DCCV, TEE, etc)?   Press F2        :[id number]}    No orders of the defined types were placed in this encounter.    F/u in ***  Signed, Elder Negus, MD

## 2024-03-18 ENCOUNTER — Encounter (HOSPITAL_COMMUNITY)
Admission: EM | Disposition: A | Discharge status: Home / Self Care | Payer: Self-pay | Source: Home / Self Care | Attending: Vascular Surgery

## 2024-03-18 ENCOUNTER — Encounter (HOSPITAL_COMMUNITY): Payer: Self-pay | Admitting: Vascular Surgery

## 2024-03-18 DIAGNOSIS — I70222 Atherosclerosis of native arteries of extremities with rest pain, left leg: Secondary | ICD-10-CM | POA: Diagnosis not present

## 2024-03-18 HISTORY — PX: ABDOMINAL AORTOGRAM W/LOWER EXTREMITY: CATH118223

## 2024-03-18 HISTORY — PX: LOWER EXTREMITY INTERVENTION: CATH118252

## 2024-03-18 LAB — CBC
HCT: 42.8 % (ref 39.0–52.0)
HCT: 40.2 % (ref 39.0–52.0)
Hemoglobin: 13.2 g/dL (ref 13.0–17.0)
Hemoglobin: 12.1 g/dL — ABNORMAL LOW (ref 13.0–17.0)
MCH: 21.9 pg — ABNORMAL LOW (ref 26.0–34.0)
MCH: 21.5 pg — ABNORMAL LOW (ref 26.0–34.0)
MCHC: 30.8 g/dL (ref 30.0–36.0)
MCHC: 30.1 g/dL (ref 30.0–36.0)
MCV: 70.9 fL — ABNORMAL LOW (ref 80.0–100.0)
MCV: 71.4 fL — ABNORMAL LOW (ref 80.0–100.0)
Platelets: 316 10*3/uL (ref 150–400)
Platelets: 240 10*3/uL (ref 150–400)
RBC: 6.04 MIL/uL — ABNORMAL HIGH (ref 4.22–5.81)
RBC: 5.63 MIL/uL (ref 4.22–5.81)
RDW: 16.6 % — ABNORMAL HIGH (ref 11.5–15.5)
RDW: 16.1 % — ABNORMAL HIGH (ref 11.5–15.5)
WBC: 7.2 10*3/uL (ref 4.0–10.5)
WBC: 6.4 10*3/uL (ref 4.0–10.5)
nRBC: 0 % (ref 0.0–0.2)
nRBC: 0 % (ref 0.0–0.2)

## 2024-03-18 LAB — HIV ANTIBODY (ROUTINE TESTING W REFLEX): HIV Screen 4th Generation wRfx: NONREACTIVE

## 2024-03-18 LAB — GLUCOSE, CAPILLARY
Glucose-Capillary: 78 mg/dL (ref 70–99)
Glucose-Capillary: 133 mg/dL — ABNORMAL HIGH (ref 70–99)
Glucose-Capillary: 142 mg/dL — ABNORMAL HIGH (ref 70–99)

## 2024-03-18 LAB — APTT
aPTT: 45 s — ABNORMAL HIGH (ref 24–36)
aPTT: 50 s — ABNORMAL HIGH (ref 24–36)

## 2024-03-18 LAB — FIBRINOGEN
Fibrinogen: 444 mg/dL (ref 210–475)
Fibrinogen: 411 mg/dL (ref 210–475)

## 2024-03-18 LAB — HEPARIN LEVEL (UNFRACTIONATED)
Heparin Unfractionated: >1.1 [IU]/mL — ABNORMAL HIGH (ref 0.30–0.70)
Heparin Unfractionated: >1.1 [IU]/mL — ABNORMAL HIGH (ref 0.30–0.70)
Heparin Unfractionated: 0.84 [IU]/mL — ABNORMAL HIGH (ref 0.30–0.70)

## 2024-03-18 LAB — MRSA NEXT GEN BY PCR, NASAL: MRSA by PCR Next Gen: NOT DETECTED

## 2024-03-18 LAB — CBG MONITORING, ED: Glucose-Capillary: 102 mg/dL — ABNORMAL HIGH (ref 70–99)

## 2024-03-18 SURGERY — ABDOMINAL AORTOGRAM W/LOWER EXTREMITY
Anesthesia: LOCAL

## 2024-03-18 MED ORDER — SODIUM CHLORIDE 0.9 % IV SOLN
1.0000 mg/h | INTRAVENOUS | Status: DC
  Administered 2024-03-18 – 2024-03-19 (×2): 1 mg/h
  Filled 2024-03-18 (×3): qty 24

## 2024-03-18 MED ORDER — MIDAZOLAM HCL 2 MG/2ML IJ SOLN
INTRAMUSCULAR | Status: DC | PRN
  Administered 2024-03-18: 1 mg via INTRAVENOUS

## 2024-03-18 MED ORDER — MIDAZOLAM HCL 2 MG/2ML IJ SOLN
INTRAMUSCULAR | Status: AC
  Filled 2024-03-18: qty 2

## 2024-03-18 MED ORDER — HEPARIN SODIUM (PORCINE) 1000 UNIT/ML IJ SOLN
INTRAMUSCULAR | Status: DC | PRN
  Administered 2024-03-18: 5000 [IU] via INTRAVENOUS

## 2024-03-18 MED ORDER — CHLORHEXIDINE GLUCONATE CLOTH 2 % EX PADS
6.0000 | MEDICATED_PAD | Freq: Every day | CUTANEOUS | Status: DC
  Administered 2024-03-19 – 2024-03-20 (×2): 6 via TOPICAL

## 2024-03-18 MED ORDER — PANTOPRAZOLE SODIUM 40 MG PO TBEC
40.0000 mg | DELAYED_RELEASE_TABLET | Freq: Every day | ORAL | Status: DC
  Filled 2024-03-18 (×3): qty 1

## 2024-03-18 MED ORDER — INSULIN ASPART 100 UNIT/ML IJ SOLN
0.0000 [IU] | Freq: Three times a day (TID) | INTRAMUSCULAR | Status: DC
  Administered 2024-03-18: 2 [IU] via SUBCUTANEOUS

## 2024-03-18 MED ORDER — SODIUM CHLORIDE 0.9 % IV SOLN: INTRAVENOUS | Status: AC

## 2024-03-18 MED ORDER — OXYCODONE-ACETAMINOPHEN 5-325 MG PO TABS
1.0000 | ORAL_TABLET | ORAL | Status: DC | PRN
  Administered 2024-03-18 – 2024-03-20 (×7): 2 via ORAL
  Filled 2024-03-18 (×7): qty 2

## 2024-03-18 MED ORDER — GUAIFENESIN-DM 100-10 MG/5ML PO SYRP: 15.0000 mL | ORAL_SOLUTION | ORAL | Status: DC | PRN

## 2024-03-18 MED ORDER — LIDOCAINE HCL (PF) 1 % IJ SOLN
INTRAMUSCULAR | Status: DC | PRN
  Administered 2024-03-18: 15 mL

## 2024-03-18 MED ORDER — CLOPIDOGREL BISULFATE 75 MG PO TABS
75.0000 mg | ORAL_TABLET | Freq: Every day | ORAL | Status: DC
  Administered 2024-03-18 – 2024-03-20 (×3): 75 mg via ORAL
  Filled 2024-03-18 (×3): qty 1

## 2024-03-18 MED ORDER — SODIUM CHLORIDE 0.9% FLUSH
3.0000 mL | Freq: Two times a day (BID) | INTRAVENOUS | Status: DC
  Administered 2024-03-18 – 2024-03-19 (×4): 3 mL via INTRAVENOUS

## 2024-03-18 MED ORDER — HYDROMORPHONE HCL 1 MG/ML IJ SOLN
0.5000 mg | INTRAMUSCULAR | Status: DC | PRN
  Administered 2024-03-18 – 2024-03-19 (×8): 1 mg via INTRAVENOUS
  Filled 2024-03-18 (×8): qty 1

## 2024-03-18 MED ORDER — HEPARIN (PORCINE) IN NACL 1000-0.9 UT/500ML-% IV SOLN
INTRAVENOUS | Status: DC | PRN
  Administered 2024-03-18 (×2): 500 mL

## 2024-03-18 MED ORDER — HEPARIN (PORCINE) 25000 UT/250ML-% IV SOLN
800.0000 [IU]/h | INTRAVENOUS | Status: DC
  Administered 2024-03-18: 800 [IU]/h via INTRAVENOUS
  Filled 2024-03-18: qty 250

## 2024-03-18 MED ORDER — POTASSIUM CHLORIDE CRYS ER 20 MEQ PO TBCR
20.0000 meq | EXTENDED_RELEASE_TABLET | Freq: Once | ORAL | Status: DC
  Filled 2024-03-18: qty 2

## 2024-03-18 MED ORDER — POTASSIUM CHLORIDE IN NACL 20-0.9 MEQ/L-% IV SOLN
INTRAVENOUS | Status: AC
  Filled 2024-03-18 (×2): qty 1000

## 2024-03-18 MED ORDER — ASPIRIN 81 MG PO CHEW
CHEWABLE_TABLET | ORAL | Status: DC | PRN
  Administered 2024-03-18: 81 mg via ORAL

## 2024-03-18 MED ORDER — SODIUM CHLORIDE 0.9 % IV SOLN
250.0000 mL | INTRAVENOUS | Status: AC | PRN
  Administered 2024-03-18: 250 mL via INTRAVENOUS

## 2024-03-18 MED ORDER — ATORVASTATIN CALCIUM 80 MG PO TABS
80.0000 mg | ORAL_TABLET | Freq: Every day | ORAL | Status: DC
  Administered 2024-03-18 – 2024-03-19 (×2): 80 mg via ORAL
  Filled 2024-03-18 (×2): qty 1

## 2024-03-18 MED ORDER — ALUM & MAG HYDROXIDE-SIMETH 200-200-20 MG/5ML PO SUSP: 15.0000 mL | ORAL | Status: DC | PRN

## 2024-03-18 MED ORDER — DIPHENHYDRAMINE HCL 25 MG PO CAPS
25.0000 mg | ORAL_CAPSULE | ORAL | Status: DC | PRN
  Administered 2024-03-18 – 2024-03-19 (×4): 25 mg via ORAL
  Filled 2024-03-18 (×4): qty 1

## 2024-03-18 MED ORDER — METOPROLOL TARTRATE 25 MG PO TABS
25.0000 mg | ORAL_TABLET | Freq: Two times a day (BID) | ORAL | Status: DC
  Administered 2024-03-18 – 2024-03-20 (×5): 25 mg via ORAL
  Filled 2024-03-18 (×5): qty 1

## 2024-03-18 MED ORDER — METOPROLOL TARTRATE 5 MG/5ML IV SOLN: 2.0000 mg | INTRAVENOUS | Status: DC | PRN

## 2024-03-18 MED ORDER — FENTANYL CITRATE (PF) 100 MCG/2ML IJ SOLN
INTRAMUSCULAR | Status: AC
  Filled 2024-03-18: qty 2

## 2024-03-18 MED ORDER — IODIXANOL 320 MG/ML IV SOLN
INTRAVENOUS | Status: DC | PRN
  Administered 2024-03-18: 40 mL

## 2024-03-18 MED ORDER — INSULIN ASPART 100 UNIT/ML IJ SOLN
4.0000 [IU] | Freq: Three times a day (TID) | INTRAMUSCULAR | Status: DC
  Administered 2024-03-18 – 2024-03-20 (×2): 4 [IU] via SUBCUTANEOUS

## 2024-03-18 MED ORDER — MIDAZOLAM HCL 2 MG/2ML IJ SOLN: 1.0000 mg | INTRAMUSCULAR | Status: DC | PRN

## 2024-03-18 MED ORDER — FENTANYL CITRATE (PF) 100 MCG/2ML IJ SOLN
INTRAMUSCULAR | Status: DC | PRN
  Administered 2024-03-18: 50 ug via INTRAVENOUS

## 2024-03-18 MED ORDER — ONDANSETRON HCL 4 MG/2ML IJ SOLN: 4.0000 mg | Freq: Four times a day (QID) | INTRAMUSCULAR | Status: DC | PRN

## 2024-03-18 MED ORDER — HEPARIN SODIUM (PORCINE) 1000 UNIT/ML IJ SOLN
INTRAMUSCULAR | Status: AC
  Filled 2024-03-18: qty 10

## 2024-03-18 MED ORDER — HYDROXYZINE HCL 25 MG PO TABS
25.0000 mg | ORAL_TABLET | Freq: Three times a day (TID) | ORAL | Status: DC
  Administered 2024-03-18 – 2024-03-20 (×6): 25 mg via ORAL
  Filled 2024-03-18 (×6): qty 1

## 2024-03-18 MED ORDER — LABETALOL HCL 5 MG/ML IV SOLN: 10.0000 mg | INTRAVENOUS | Status: DC | PRN

## 2024-03-18 MED ORDER — LIDOCAINE HCL (PF) 1 % IJ SOLN
INTRAMUSCULAR | Status: AC
  Filled 2024-03-18: qty 30

## 2024-03-18 MED ORDER — PIOGLITAZONE HCL 30 MG PO TABS
30.0000 mg | ORAL_TABLET | Freq: Every day | ORAL | Status: DC
  Administered 2024-03-19 – 2024-03-20 (×2): 30 mg via ORAL
  Filled 2024-03-18 (×3): qty 1

## 2024-03-18 MED ORDER — MORPHINE SULFATE (PF) 2 MG/ML IV SOLN: 5.0000 mg | INTRAVENOUS | Status: DC | PRN

## 2024-03-18 MED ORDER — PANTOPRAZOLE SODIUM 40 MG PO TBEC
40.0000 mg | DELAYED_RELEASE_TABLET | Freq: Every day | ORAL | Status: DC
  Administered 2024-03-18 – 2024-03-20 (×3): 40 mg via ORAL
  Filled 2024-03-18: qty 1

## 2024-03-18 MED ORDER — PHENOL 1.4 % MT LIQD: 1.0000 | OROMUCOSAL | Status: DC | PRN

## 2024-03-18 MED ORDER — SODIUM CHLORIDE 0.9% FLUSH: 3.0000 mL | INTRAVENOUS | Status: DC | PRN

## 2024-03-18 MED ORDER — HEPARIN BOLUS VIA INFUSION
2000.0000 [IU] | Freq: Once | INTRAVENOUS | Status: AC
  Administered 2024-03-18: 2000 [IU] via INTRAVENOUS
  Filled 2024-03-18: qty 2000

## 2024-03-18 MED ORDER — HYDRALAZINE HCL 20 MG/ML IJ SOLN: 5.0000 mg | INTRAMUSCULAR | Status: DC | PRN

## 2024-03-18 MED ORDER — FLUTICASONE PROPIONATE 50 MCG/ACT NA SUSP
1.0000 | Freq: Every day | NASAL | Status: DC
  Filled 2024-03-18: qty 16

## 2024-03-18 MED ORDER — ASPIRIN 81 MG PO CHEW
CHEWABLE_TABLET | ORAL | Status: AC
  Filled 2024-03-18: qty 1

## 2024-03-18 SURGICAL SUPPLY — 12 items
CATH EKOS ULTRASOUND 135X40 (CATHETERS) IMPLANT
CATH EKOS ULTRASOUND 135X50 (CATHETERS) IMPLANT
CATH OMNI FLUSH 5F 65CM (CATHETERS) IMPLANT
COVER DOME SNAP 22 D (MISCELLANEOUS) IMPLANT
GLIDEWIRE ADV .035X260CM (WIRE) IMPLANT
KIT MICROPUNCTURE NIT STIFF (SHEATH) IMPLANT
SET ATX-X65L (MISCELLANEOUS) IMPLANT
SHEATH CATAPULT 6F 45 MP (SHEATH) IMPLANT
SHEATH PINNACLE 5F 10CM (SHEATH) IMPLANT
SHEATH PROBE COVER 6X72 (BAG) IMPLANT
TRAY PV CATH (CUSTOM PROCEDURE TRAY) ×2 IMPLANT
WIRE BENTSON .035X145CM (WIRE) IMPLANT

## 2024-03-18 NOTE — Progress Notes (Addendum)
 ANTICOAGULATION CONSULT NOTE  Pharmacy Consult for Heparin Indication:  ischemic leg  Allergies  Allergen Reactions   Metformin And Related Other (See Comments)    GI side effects. Stopped 2016    Patient Measurements: Height: 5\' 8"  (172.7 cm) Weight: 79.5 kg (175 lb 4.8 oz) IBW/kg (Calculated) : 68.4 Heparin Dosing Weight: 79.5 kg  Vital Signs: Temp: 97.9 F (36.6 C) (04/02 1232) Temp Source: Oral (04/02 1232) BP: 104/76 (04/02 0800) Pulse Rate: 79 (04/02 0800)  Labs: Recent Labs    03/17/24 1912 03/17/24 2021 03/18/24 0533  HGB 13.2  --   --   HCT 42.7  --   --   PLT 323  --   --   APTT  --  28 45*  HEPARINUNFRC  --  >1.10* >1.10*  CREATININE 1.31*  --   --     Estimated Creatinine Clearance: 55.8 mL/min (A) (by C-G formula based on SCr of 1.31 mg/dL (H)).   Medical History: Past Medical History:  Diagnosis Date   Back pain, chronic    Coronary artery disease    Diabetes mellitus 2007   Hepatitis C    [id number]   History of chronic hepatitis C 05/13/2012   First noted 2013. RF inc tattoos obtained while incarcerated. Harvoni 2016 SVR 2017    Hypertension goal BP (blood pressure) < 140/80    Microcytic anemia    Neuromuscular disorder (HCC)    PAD (peripheral artery disease) (HCC)    Pancreatitis 01/09/2012    Medications:  Medications Prior to Admission  Medication Sig Dispense Refill Last Dose/Taking   Accu-Chek Softclix Lancets lancets check blood sugar 2 times a day 100 each 5 Past Week   acetaminophen (TYLENOL) 500 MG tablet Take 1,000 mg by mouth every 6 (six) hours as needed for mild pain (pain score 1-3) or headache.   Past Week   apixaban (ELIQUIS) 5 MG TABS tablet Take 1 tablet (5 mg total) by mouth 2 (two) times daily. 60 tablet 1 03/16/2024 at  7:30 AM   atorvastatin (LIPITOR) 80 MG tablet Take 1 tablet (80 mg total) by mouth daily at 6 PM. 90 tablet 3 Past Week   Blood Glucose Monitoring Suppl (ACCU-CHEK GUIDE) w/Device KIT Check blood  sugar 2 times a day 1 kit 1 Past Week   clopidogrel (PLAVIX) 75 MG tablet Take 1 tablet (75 mg total) by mouth daily. 90 tablet 3 Past Week   colchicine 0.6 MG tablet Take 1 tablet (0.6 mg total) by mouth daily as needed (gout flare). 30 tablet 2 Unknown   Insulin Pen Needle (PEN NEEDLES) 32G X 4 MM MISC Use to inject liraglutide once a day 100 each 3 Past Week   metoprolol tartrate (LOPRESSOR) 25 MG tablet Take 1 tablet (25 mg total) by mouth 2 (two) times daily. 60 tablet 2 Past Week   pioglitazone (ACTOS) 30 MG tablet Take 1 tablet (30 mg total) by mouth daily. 30 tablet 11 Past Week   omeprazole (PRILOSEC) 40 MG capsule Take 1 capsule (40 mg total) by mouth daily. (Patient not taking: Reported on 03/17/2024) 90 capsule 3 Not Taking   tadalafil (CIALIS) 10 MG tablet Take 1 tablet (10 mg total) by mouth as needed for erectile dysfunction. (Patient not taking: Reported on 03/17/2024) 20 tablet 1 Not Taking   Scheduled:   atorvastatin  80 mg Oral q1800   clopidogrel  75 mg Oral Daily   fluticasone  1 spray Each Nare Daily   insulin aspart  0-15 Units Subcutaneous TID WC   insulin aspart  4 Units Subcutaneous TID WC   metoprolol tartrate  25 mg Oral BID   pantoprazole  40 mg Oral Daily   pantoprazole  40 mg Oral Daily   pioglitazone  30 mg Oral Daily   potassium chloride  20-40 mEq Oral Once   sodium chloride flush  3 mL Intravenous Q12H   Infusions:   sodium chloride 250 mL (03/18/24 1144)   0.9 % NaCl with KCl 20 mEq / L 75 mL/hr at 03/18/24 0817   alteplase (LIMB ISCHEMIA) 24 mg in normal saline (0.048 mg/mL) infusion 1 mg/hr (03/18/24 1210)   heparin 800 Units/hr (03/18/24 1145)   PRN: sodium chloride, alum & mag hydroxide-simeth, diphenhydrAMINE, guaiFENesin-dextromethorphan, hydrALAZINE, HYDROmorphone (DILAUDID) injection, labetalol, metoprolol tartrate, midazolam, morphine injection, ondansetron, oxyCODONE-acetaminophen, phenol, sodium chloride flush  Assessment: 63 yom with a  history of CAD, DM, HTN, PAD, pancreatitis. Patient is presenting with L leg pain, pt had a vascular surgery on 3/18. EMS unable to palpate pule on left foot. Heparin per pharmacy consult placed for  ischemic leg . Patient is on apixaban prior to arrival (Last dose 3/31 AM per patient).   S/p left LE angiogram with occluded SFA stenting (placed 02/24/24) now with cath-directed lysis. Heparin 800 units/h fixed rate per [facility]VS.  Lysis recheck tomorrow.  Goal of Therapy:  Heparin level 0.3-0.5 units/ml aPTT 66-102 seconds Monitor platelets by anticoagulation protocol: Yes   Plan:  Heparin 800 units/hr fixed rate per VVS Alteplase 1 mg/h No levels needed per VVS - will monitor aPTT with recent DOAC use F/u anticoagulation after EKOS (Eliquis PTA for PAD)  Thank you for allowing pharmacy to participate in this patient's care.  Trixie Rude, PharmD Clinical Pharmacist 03/18/2024  1:03 PM

## 2024-03-18 NOTE — Progress Notes (Signed)
 ANTICOAGULATION CONSULT NOTE  Pharmacy Consult for Heparin Indication:  ischemic leg  Allergies  Allergen Reactions   Metformin And Related Other (See Comments)    GI side effects. Stopped 2016    Patient Measurements: Height: 5\' 8"  (172.7 cm) Weight: 79.5 kg (175 lb 4.8 oz) IBW/kg (Calculated) : 68.4 Heparin Dosing Weight: 79.5 kg  Vital Signs: Temp: 97.8 F (36.6 C) (04/02 1500) Temp Source: Oral (04/02 1500) BP: 118/69 (04/02 1815) Pulse Rate: 86 (04/02 1815)  Labs: Recent Labs    03/17/24 1912 03/17/24 2021 03/17/24 2021 03/18/24 0533 03/18/24 1250 03/18/24 1825  HGB 13.2  --   --   --  13.2 12.1*  HCT 42.7  --   --   --  42.8 40.2  PLT 323  --   --   --  316 240  APTT  --  28  --  45*  --  50*  HEPARINUNFRC  --  >1.10*   < > >1.10* >1.10* 0.84*  CREATININE 1.31*  --   --   --   --   --    < > = values in this interval not displayed.    Estimated Creatinine Clearance: 55.8 mL/min (A) (by C-G formula based on SCr of 1.31 mg/dL (H)).   Medical History: Past Medical History:  Diagnosis Date   Back pain, chronic    Coronary artery disease    Diabetes mellitus 2007   Hepatitis C    [id number]   History of chronic hepatitis C 05/13/2012   First noted 2013. RF inc tattoos obtained while incarcerated. Harvoni 2016 SVR 2017    Hypertension goal BP (blood pressure) < 140/80    Microcytic anemia    Neuromuscular disorder (HCC)    PAD (peripheral artery disease) (HCC)    Pancreatitis 01/09/2012    Medications:  Medications Prior to Admission  Medication Sig Dispense Refill Last Dose/Taking   Accu-Chek Softclix Lancets lancets check blood sugar 2 times a day 100 each 5 Past Week   acetaminophen (TYLENOL) 500 MG tablet Take 1,000 mg by mouth every 6 (six) hours as needed for mild pain (pain score 1-3) or headache.   Past Week   apixaban (ELIQUIS) 5 MG TABS tablet Take 1 tablet (5 mg total) by mouth 2 (two) times daily. 60 tablet 1 03/16/2024 at  7:30 AM    atorvastatin (LIPITOR) 80 MG tablet Take 1 tablet (80 mg total) by mouth daily at 6 PM. 90 tablet 3 Past Week   Blood Glucose Monitoring Suppl (ACCU-CHEK GUIDE) w/Device KIT Check blood sugar 2 times a day 1 kit 1 Past Week   clopidogrel (PLAVIX) 75 MG tablet Take 1 tablet (75 mg total) by mouth daily. 90 tablet 3 Past Week   colchicine 0.6 MG tablet Take 1 tablet (0.6 mg total) by mouth daily as needed (gout flare). 30 tablet 2 Unknown   Insulin Pen Needle (PEN NEEDLES) 32G X 4 MM MISC Use to inject liraglutide once a day 100 each 3 Past Week   metoprolol tartrate (LOPRESSOR) 25 MG tablet Take 1 tablet (25 mg total) by mouth 2 (two) times daily. 60 tablet 2 Past Week   pioglitazone (ACTOS) 30 MG tablet Take 1 tablet (30 mg total) by mouth daily. 30 tablet 11 Past Week   omeprazole (PRILOSEC) 40 MG capsule Take 1 capsule (40 mg total) by mouth daily. (Patient not taking: Reported on 03/17/2024) 90 capsule 3 Not Taking   tadalafil (CIALIS) 10 MG tablet Take 1  tablet (10 mg total) by mouth as needed for erectile dysfunction. (Patient not taking: Reported on 03/17/2024) 20 tablet 1 Not Taking   Scheduled:   atorvastatin  80 mg Oral q1800   clopidogrel  75 mg Oral Daily   fluticasone  1 spray Each Nare Daily   hydrOXYzine  25 mg Oral TID   insulin aspart  0-15 Units Subcutaneous TID WC   insulin aspart  4 Units Subcutaneous TID WC   metoprolol tartrate  25 mg Oral BID   pantoprazole  40 mg Oral Daily   pantoprazole  40 mg Oral Daily   pioglitazone  30 mg Oral Daily   potassium chloride  20-40 mEq Oral Once   sodium chloride flush  3 mL Intravenous Q12H   Infusions:   sodium chloride 35 mL/hr at 03/18/24 1400   sodium chloride 35 mL/hr at 03/18/24 1800   0.9 % NaCl with KCl 20 mEq / L 75 mL/hr at 03/18/24 0818   alteplase (LIMB ISCHEMIA) 24 mg in normal saline (0.048 mg/mL) infusion 1 mg/hr (03/18/24 1800)   heparin 800 Units/hr (03/18/24 1800)   PRN: sodium chloride, alum & mag  hydroxide-simeth, diphenhydrAMINE, guaiFENesin-dextromethorphan, hydrALAZINE, HYDROmorphone (DILAUDID) injection, labetalol, metoprolol tartrate, midazolam, morphine injection, ondansetron, oxyCODONE-acetaminophen, phenol, sodium chloride flush  Assessment: 63 yom with a history of CAD, DM, HTN, PAD, pancreatitis. Patient is presenting with L leg pain, pt had a vascular surgery on 3/18. EMS unable to palpate pule on left foot. Heparin per pharmacy consult placed for  ischemic leg . Patient is on apixaban prior to arrival (Last dose 3/31 AM per patient).   S/p left LE angiogram with occluded SFA stenting (placed 02/24/24) now with cath-directed lysis. Heparin 800 units/h fixed rate per VVS.  Lysis recheck tomorrow.  HL 0.84/PTT 50/Fibrinogen 411. Continue fixed dose heparin and recheck in 6 hr.   Goal of Therapy:  Heparin level 0.3-0.5 units/ml aPTT 66-102 seconds Monitor platelets by anticoagulation protocol: Yes   Plan:  Cont heparin 800 units/hr fixed rate per VVS Alteplase 1 mg/h No levels needed per VVS - will monitor aPTT with recent DOAC use F/u anticoagulation after EKOS (Eliquis PTA for PAD)  Ulyses Southward, PharmD, BCIDP, AAHIVP, CPP Infectious Disease Pharmacist 03/18/2024 7:32 PM

## 2024-03-18 NOTE — Op Note (Addendum)
    Patient name: Donald Palmer MRN: [id number] DOB: 1961/07/10 Sex: male  03/17/2024 - 03/18/2024 Pre-operative Diagnosis: Left lower extremity acute ischemia Post-operative diagnosis:  Same Surgeon:  Victorino Sparrow, MD Procedure Performed: 1.  Ultrasound-guided micropuncture access right common femoral artery in retrograde fashion 2.  Aortogram 3.  Second-order cannulation, left lower extremity angiogram 4.  Selective angiography of the popliteal artery 5.  Placement 40 cm EKOS lysis catheter superficial femoral artery, popliteal artery 6.  Moderate sedation time 40 minutes, contrast volume 40ml    Indications: Patient is a 63 year old male with history of PAD, most recently having undergone SFA popliteal artery stenting on 02/24/2024.  Patient's when out of town, and stopped taking his medications including Plavix and Xarelto.  He presents with new onset rest pain in the left lower extremity with imaging demonstrating occluded stents.  After discussing risks and benefits of pharmacomechanical thrombolysis, Donald Palmer elected to proceed.  Findings:   Aortogram: No flow-limiting stenosis appreciated in the aortoiliac segments bilaterally.  On the left: Widely patent common femoral artery, profunda.  The superficial femoral artery occludes after roughly 1 cm at the start of previously placed stents.  It is occluded through the stents, reconstituting at the P1 segment of the popliteal artery.  There is three-vessel runoff to the mid tibia with the posterior tibial artery continuing into the foot feeling plantar arteries. Runoff is slow, with vessels underfilled.   Procedure:  The patient was identified in the holding area and taken to room 8.  The patient was then placed supine on the table and prepped and draped in the usual sterile fashion.  A time out was called. Moderate sedation was administered using fentanyl and versed. Ultrasound was used to evaluate the right common femoral artery.  It was patent  .  A digital ultrasound image was acquired.  A micropuncture needle was used to access the right common femoral artery under ultrasound guidance.  An 018 wire was advanced without resistance and a micropuncture sheath was placed.  The 018 wire was removed and a benson wire was placed.  The micropuncture sheath was exchanged for a 5 french sheath.  An omniflush catheter was advanced over the wire to the level of L-1.  An abdominal angiogram was obtained.  Next, using the omniflush catheter and a benson wire, the aortic bifurcation was crossed and the catheter was placed into theleft external iliac artery and left runoff was obtained.    I elected to attempt intervention on the left lower extremity.  The patient was heparinized, and a 6 x 45 cm sheath was brought to the field and parked in the left common femoral artery.  From this position, a series of wires and catheters were used to cross the thrombosed SFA popliteal stents.  A catheter was driven distally to prove that I was true lumen.  This was done with diagnostic angiography of the popliteal artery.  Next, a 40 cm EKOS pharmacomechanical lytic catheter was placed from the left common femoral artery into the popliteal artery.  This will be run at 1 mg of tPA per hour for the next 24 hours with recheck tomorrow.  The sheath will run at 800 units of heparin per hour until recheck.   Patient tolerated the procedure well.  Impression: Successful placement of left-sided femoral-popliteal EKOS lytic catheter.    Victorino Sparrow MD [facility] Office: [phone]

## 2024-03-18 NOTE — Progress Notes (Addendum)
 ANTICOAGULATION CONSULT NOTE  Pharmacy Consult for Heparin Indication:  ischemic leg  Allergies  Allergen Reactions   Metformin And Related Other (See Comments)    GI side effects. Stopped 2016    Patient Measurements: Height: 5\' 8"  (172.7 cm) Weight: 79.5 kg (175 lb 4.8 oz) IBW/kg (Calculated) : 68.4 Heparin Dosing Weight: 79.5 kg  Vital Signs: Temp: 98.3 F (36.8 C) (04/02 0400) Temp Source: Oral (04/02 0400) BP: 121/72 (04/02 0400) Pulse Rate: 96 (04/02 0400)  Labs: Recent Labs    03/17/24 1912 03/17/24 2021 03/18/24 0533  HGB 13.2  --   --   HCT 42.7  --   --   PLT 323  --   --   APTT  --  28 45*  HEPARINUNFRC  --  >1.10* >1.10*  CREATININE 1.31*  --   --     Estimated Creatinine Clearance: 55.8 mL/min (A) (by C-G formula based on SCr of 1.31 mg/dL (H)).   Medical History: Past Medical History:  Diagnosis Date   Back pain, chronic    Coronary artery disease    Diabetes mellitus 2007   Hepatitis C    [id number]   History of chronic hepatitis C 05/13/2012   First noted 2013. RF inc tattoos obtained while incarcerated. Harvoni 2016 SVR 2017    Hypertension goal BP (blood pressure) < 140/80    Microcytic anemia    Neuromuscular disorder (HCC)    PAD (peripheral artery disease) (HCC)    Pancreatitis 01/09/2012    Medications:  (Not in a hospital admission)  Scheduled:   atorvastatin  80 mg Oral q1800   clopidogrel  75 mg Oral Daily   fluticasone  1 spray Each Nare Daily   insulin aspart  0-15 Units Subcutaneous TID WC   insulin aspart  4 Units Subcutaneous TID WC   metoprolol tartrate  25 mg Oral BID   pantoprazole  40 mg Oral Daily   pantoprazole  40 mg Oral Daily   pioglitazone  30 mg Oral Daily   potassium chloride  20-40 mEq Oral Once   Infusions:   0.9 % NaCl with KCl 20 mEq / L     heparin 1,400 Units/hr (03/17/24 2018)   PRN: alum & mag hydroxide-simeth, guaiFENesin-dextromethorphan, hydrALAZINE, HYDROmorphone (DILAUDID) injection,  labetalol, metoprolol tartrate, ondansetron, oxyCODONE-acetaminophen, phenol  Assessment: 63 yom with a history of CAD, DM, HTN, PAD, pancreatitis. Patient is presenting with L leg pain, pt had a vascular surgery on 3/18. EMS unable to palpate pule on left foot. Heparin per pharmacy consult placed for  ischemic leg . Patient is on apixaban prior to arrival (Last dose 3/31 AM per patient).   aPTT 45, subtherapeutic  Heparin level >1.10, falsely elevated due to apixaban prior to admission No issues with infusion or bleeding noted.   Goal of Therapy:  Heparin level 0.3-0.7 units/ml aPTT 66-102 seconds Monitor platelets by anticoagulation protocol: Yes   Plan:  Heparin 2000 unit bolus x 1 and Increase heparin infusion at 1500 units/hr Check aPTT & anti-Xa level in 8 hours and daily while on heparin Continue to monitor via aPTT until levels are correlated Continue to monitor H&H and platelets F/u plans for anticoagulation   Thank you for allowing pharmacy to participate in this patient's care.  Marja Kays, PharmD Emergency Medicine Clinical Pharmacist 03/18/2024,6:29 AM

## 2024-03-19 ENCOUNTER — Other Ambulatory Visit (HOSPITAL_COMMUNITY): Payer: Self-pay

## 2024-03-19 ENCOUNTER — Encounter (HOSPITAL_COMMUNITY)
Admission: EM | Disposition: A | Discharge status: Home / Self Care | Payer: Self-pay | Source: Home / Self Care | Attending: Vascular Surgery

## 2024-03-19 DIAGNOSIS — T82868A Thrombosis of vascular prosthetic devices, implants and grafts, initial encounter: Principal | ICD-10-CM

## 2024-03-19 HISTORY — PX: PERIPHERAL VASCULAR THROMBECTOMY: CATH118306

## 2024-03-19 LAB — BASIC METABOLIC PANEL WITH GFR
Anion gap: 6 (ref 5–15)
BUN: 9 mg/dL (ref 8–23)
CO2: 28 mmol/L (ref 22–32)
Calcium: 9.1 mg/dL (ref 8.9–10.3)
Chloride: 105 mmol/L (ref 98–111)
Creatinine, Ser: 1.24 mg/dL (ref 0.61–1.24)
GFR, Estimated: >60 mL/min (ref >60)
Glucose, Bld: 87 mg/dL (ref 70–99)
Potassium: 4.5 mmol/L (ref 3.5–5.1)
Sodium: 139 mmol/L (ref 135–145)

## 2024-03-19 LAB — CBC
HCT: 36 % — ABNORMAL LOW (ref 39.0–52.0)
HCT: 38.8 % — ABNORMAL LOW (ref 39.0–52.0)
Hemoglobin: 11.2 g/dL — ABNORMAL LOW (ref 13.0–17.0)
Hemoglobin: 11.7 g/dL — ABNORMAL LOW (ref 13.0–17.0)
MCH: 22 pg — ABNORMAL LOW (ref 26.0–34.0)
MCH: 21.3 pg — ABNORMAL LOW (ref 26.0–34.0)
MCHC: 31.1 g/dL (ref 30.0–36.0)
MCHC: 30.2 g/dL (ref 30.0–36.0)
MCV: 70.6 fL — ABNORMAL LOW (ref 80.0–100.0)
MCV: 70.7 fL — ABNORMAL LOW (ref 80.0–100.0)
Platelets: 242 10*3/uL (ref 150–400)
Platelets: 214 10*3/uL (ref 150–400)
RBC: 5.1 MIL/uL (ref 4.22–5.81)
RBC: 5.49 MIL/uL (ref 4.22–5.81)
RDW: 15.9 % — ABNORMAL HIGH (ref 11.5–15.5)
RDW: 15.9 % — ABNORMAL HIGH (ref 11.5–15.5)
WBC: 6.7 10*3/uL (ref 4.0–10.5)
WBC: 6.1 10*3/uL (ref 4.0–10.5)
nRBC: 0 % (ref 0.0–0.2)
nRBC: 0 % (ref 0.0–0.2)

## 2024-03-19 LAB — HEPARIN LEVEL (UNFRACTIONATED)
Heparin Unfractionated: 0.44 [IU]/mL (ref 0.30–0.70)
Heparin Unfractionated: 0.22 [IU]/mL — ABNORMAL LOW (ref 0.30–0.70)

## 2024-03-19 LAB — GLUCOSE, CAPILLARY
Glucose-Capillary: 76 mg/dL (ref 70–99)
Glucose-Capillary: 88 mg/dL (ref 70–99)
Glucose-Capillary: 79 mg/dL (ref 70–99)

## 2024-03-19 LAB — FIBRINOGEN
Fibrinogen: 438 mg/dL (ref 210–475)
Fibrinogen: 406 mg/dL (ref 210–475)

## 2024-03-19 LAB — APTT: aPTT: 49 s — ABNORMAL HIGH (ref 24–36)

## 2024-03-19 SURGERY — PERIPHERAL VASCULAR THROMBECTOMY
Anesthesia: LOCAL

## 2024-03-19 MED ORDER — HEPARIN SODIUM (PORCINE) 1000 UNIT/ML IJ SOLN
INTRAMUSCULAR | Status: AC
  Filled 2024-03-19: qty 10

## 2024-03-19 MED ORDER — FENTANYL CITRATE (PF) 100 MCG/2ML IJ SOLN
INTRAMUSCULAR | Status: DC | PRN
  Administered 2024-03-19 (×2): 25 ug via INTRAVENOUS

## 2024-03-19 MED ORDER — SODIUM CHLORIDE 0.9 % IV SOLN: INTRAVENOUS | Status: AC

## 2024-03-19 MED ORDER — HEPARIN (PORCINE) IN NACL 1000-0.9 UT/500ML-% IV SOLN
INTRAVENOUS | Status: DC | PRN
  Administered 2024-03-19: 500 mL

## 2024-03-19 MED ORDER — IODIXANOL 320 MG/ML IV SOLN
INTRAVENOUS | Status: DC | PRN
  Administered 2024-03-19: 50 mL

## 2024-03-19 MED ORDER — HEPARIN (PORCINE) 25000 UT/250ML-% IV SOLN
800.0000 [IU]/h | INTRAVENOUS | Status: DC
  Administered 2024-03-19: 800 [IU]/h via INTRAVENOUS
  Filled 2024-03-19: qty 250

## 2024-03-19 MED ORDER — SODIUM CHLORIDE 0.9% FLUSH
3.0000 mL | Freq: Two times a day (BID) | INTRAVENOUS | Status: DC
  Administered 2024-03-19 – 2024-03-20 (×2): 3 mL via INTRAVENOUS

## 2024-03-19 MED ORDER — LIDOCAINE HCL (PF) 1 % IJ SOLN
INTRAMUSCULAR | Status: DC | PRN
  Administered 2024-03-19: 10 mL via INTRADERMAL

## 2024-03-19 MED ORDER — SODIUM CHLORIDE 0.9 % IV SOLN: 250.0000 mL | INTRAVENOUS | Status: DC | PRN

## 2024-03-19 MED ORDER — HYDRALAZINE HCL 20 MG/ML IJ SOLN: 5.0000 mg | INTRAMUSCULAR | Status: DC | PRN

## 2024-03-19 MED ORDER — ACETAMINOPHEN 325 MG PO TABS: 650.0000 mg | ORAL_TABLET | ORAL | Status: DC | PRN

## 2024-03-19 MED ORDER — LIDOCAINE HCL (PF) 1 % IJ SOLN
INTRAMUSCULAR | Status: AC
  Filled 2024-03-19: qty 30

## 2024-03-19 MED ORDER — MIDAZOLAM HCL 2 MG/2ML IJ SOLN
INTRAMUSCULAR | Status: DC | PRN
  Administered 2024-03-19: 1 mg via INTRAVENOUS

## 2024-03-19 MED ORDER — MIDAZOLAM HCL 2 MG/2ML IJ SOLN
INTRAMUSCULAR | Status: AC
  Filled 2024-03-19: qty 2

## 2024-03-19 MED ORDER — FENTANYL CITRATE (PF) 100 MCG/2ML IJ SOLN
INTRAMUSCULAR | Status: AC
  Filled 2024-03-19: qty 2

## 2024-03-19 MED ORDER — SODIUM CHLORIDE 0.9% FLUSH: 3.0000 mL | INTRAVENOUS | Status: DC | PRN

## 2024-03-19 MED ORDER — INFLUENZA VIRUS VACC SPLIT PF (FLUZONE) 0.5 ML IM SUSY
0.5000 mL | PREFILLED_SYRINGE | INTRAMUSCULAR | Status: AC
Start: 2024-03-20 — End: 2024-03-20
  Administered 2024-03-20: 0.5 mL via INTRAMUSCULAR
  Filled 2024-03-19: qty 0.5

## 2024-03-19 MED ORDER — HEPARIN SODIUM (PORCINE) 1000 UNIT/ML IJ SOLN
INTRAMUSCULAR | Status: DC | PRN
  Administered 2024-03-19: 5000 [IU] via INTRAVENOUS

## 2024-03-19 SURGICAL SUPPLY — 17 items
BALLN IN.PACT DCB 5X40 (BALLOONS) ×1 IMPLANT
BALLN STERLING OTW 5X40X135 (BALLOONS) ×1 IMPLANT
BALLOON STERLING OTW 5X40X135 (BALLOONS) IMPLANT
CANISTER PENUMBRA ENGINE (MISCELLANEOUS) IMPLANT
CATH LIGHTNING 7 XTORQ 130 (CATHETERS) IMPLANT
CATH QUICKCROSS .035X135CM (MICROCATHETER) IMPLANT
COVER DOME SNAP 22 D (MISCELLANEOUS) IMPLANT
DCB IN.PACT 5X40 (BALLOONS) IMPLANT
DEVICE CLOSURE MYNXGRIP 6/7F (Vascular Products) IMPLANT
GLIDEWIRE ADV .035X260CM (WIRE) IMPLANT
KIT ENCORE 26 ADVANTAGE (KITS) IMPLANT
KIT SINGLE USE MANIFOLD (KITS) IMPLANT
SET ATX-X65L (MISCELLANEOUS) IMPLANT
SHEATH CATAPULT 7FR 45 (SHEATH) IMPLANT
SHEATH PINNACLE 7F 10CM (SHEATH) IMPLANT
TRAY PV CATH (CUSTOM PROCEDURE TRAY) IMPLANT
WIRE G V18X300CM (WIRE) IMPLANT

## 2024-03-19 NOTE — Plan of Care (Signed)
  Problem: Education: Goal: Ability to describe self-care measures that may prevent or decrease complications (Diabetes Survival Skills Education) will improve Outcome: Progressing Goal: Individualized Educational Video(s) Outcome: Progressing   Problem: Coping: Goal: Ability to adjust to condition or change in health will improve Outcome: Progressing   Problem: Fluid Volume: Goal: Ability to maintain a balanced intake and output will improve Outcome: Progressing   Problem: Metabolic: Goal: Ability to maintain appropriate glucose levels will improve Outcome: Progressing   Problem: Nutritional: Goal: Maintenance of adequate nutrition will improve Outcome: Progressing Goal: Progress toward achieving an optimal weight will improve Outcome: Progressing   Problem: Skin Integrity: Goal: Risk for impaired skin integrity will decrease Outcome: Progressing   Problem: Education: Goal: Knowledge of General Education information will improve Description: Including pain rating scale, medication(s)/side effects and non-pharmacologic comfort measures Outcome: Progressing   Problem: Health Behavior/Discharge Planning: Goal: Ability to manage health-related needs will improve Outcome: Progressing   Problem: Clinical Measurements: Goal: Ability to maintain clinical measurements within normal limits will improve Outcome: Progressing Goal: Will remain free from infection Outcome: Progressing Goal: Diagnostic test results will improve Outcome: Progressing Goal: Respiratory complications will improve Outcome: Progressing Goal: Cardiovascular complication will be avoided Outcome: Progressing   Problem: Activity: Goal: Risk for activity intolerance will decrease Outcome: Progressing   Problem: Nutrition: Goal: Adequate nutrition will be maintained Outcome: Progressing   Problem: Coping: Goal: Level of anxiety will decrease Outcome: Progressing   Problem: Elimination: Goal: Will  not experience complications related to bowel motility Outcome: Progressing Goal: Will not experience complications related to urinary retention Outcome: Progressing   Problem: Pain Managment: Goal: General experience of comfort will improve and/or be controlled Outcome: Progressing   Problem: Skin Integrity: Goal: Risk for impaired skin integrity will decrease Outcome: Progressing   Problem: Education: Goal: Individualized Educational Video(s) Outcome: Progressing   Problem: Activity: Goal: Ability to return to baseline activity level will improve Outcome: Progressing   Problem: Health Behavior/Discharge Planning: Goal: Ability to safely manage health-related needs after discharge will improve Outcome: Progressing  Roosevelt Locks, RN

## 2024-03-19 NOTE — Progress Notes (Signed)
 ANTICOAGULATION CONSULT NOTE  Pharmacy Consult for Heparin Indication:  ischemic leg  Allergies  Allergen Reactions   Metformin And Related Other (See Comments)    GI side effects. Stopped 2016    Patient Measurements: Height: 5\' 8"  (172.7 cm) Weight: 79.5 kg (175 lb 4.8 oz) IBW/kg (Calculated) : 68.4 Heparin Dosing Weight: 79.5 kg  Vital Signs: Temp: 98.2 F (36.8 C) (04/03 1126) Temp Source: Oral (04/03 1126) BP: 120/71 (04/03 1200) Pulse Rate: 83 (04/03 0630)  Labs: Recent Labs    03/17/24 1912 03/17/24 2021 03/18/24 0533 03/18/24 1250 03/18/24 1825 03/19/24 0042 03/19/24 0923  HGB 13.2  --   --    < > 12.1* 11.2* 11.7*  HCT 42.7  --   --    < > 40.2 36.0* 38.8*  PLT 323  --   --    < > 240 242 214  APTT  --    < > 45*  --  50* 49*  --   HEPARINUNFRC  --    < > >1.10*   < > 0.84* 0.44 0.22*  CREATININE 1.31*  --   --   --   --   --  1.24   < > = values in this interval not displayed.    Estimated Creatinine Clearance: 59 mL/min (by C-G formula based on SCr of 1.24 mg/dL).   Medical History: Past Medical History:  Diagnosis Date   Back pain, chronic    Coronary artery disease    Diabetes mellitus 2007   Hepatitis C    [id number]   History of chronic hepatitis C 05/13/2012   First noted 2013. RF inc tattoos obtained while incarcerated. [place] 2016 SVR 2017    Hypertension goal BP (blood pressure) < 140/80    Microcytic anemia    Neuromuscular disorder (HCC)    PAD (peripheral artery disease) (HCC)    Pancreatitis 01/09/2012    Medications:  Medications Prior to Admission  Medication Sig Dispense Refill Last Dose/Taking   Accu-Chek Softclix Lancets lancets check blood sugar 2 times a day 100 each 5 Past Week   acetaminophen (TYLENOL) 500 MG tablet Take 1,000 mg by mouth every 6 (six) hours as needed for mild pain (pain score 1-3) or headache.   Past Week   apixaban (ELIQUIS) 5 MG TABS tablet Take 1 tablet (5 mg total) by mouth 2 (two) times daily.  60 tablet 1 03/16/2024 at  7:30 AM   atorvastatin (LIPITOR) 80 MG tablet Take 1 tablet (80 mg total) by mouth daily at 6 PM. 90 tablet 3 Past Week   Blood Glucose Monitoring Suppl (ACCU-CHEK GUIDE) w/Device KIT Check blood sugar 2 times a day 1 kit 1 Past Week   clopidogrel (PLAVIX) 75 MG tablet Take 1 tablet (75 mg total) by mouth daily. 90 tablet 3 Past Week   colchicine 0.6 MG tablet Take 1 tablet (0.6 mg total) by mouth daily as needed (gout flare). 30 tablet 2 Unknown   Insulin Pen Needle (PEN NEEDLES) 32G X 4 MM MISC Use to inject liraglutide once a day 100 each 3 Past Week   metoprolol tartrate (LOPRESSOR) 25 MG tablet Take 1 tablet (25 mg total) by mouth 2 (two) times daily. 60 tablet 2 Past Week   pioglitazone (ACTOS) 30 MG tablet Take 1 tablet (30 mg total) by mouth daily. 30 tablet 11 Past Week   omeprazole (PRILOSEC) 40 MG capsule Take 1 capsule (40 mg total) by mouth daily. (Patient not taking: Reported on  03/17/2024) 90 capsule 3 Not Taking   tadalafil (CIALIS) 10 MG tablet Take 1 tablet (10 mg total) by mouth as needed for erectile dysfunction. (Patient not taking: Reported on 03/17/2024) 20 tablet 1 Not Taking   Scheduled:   atorvastatin  80 mg Oral q1800   Chlorhexidine Gluconate Cloth  6 each Topical Daily   clopidogrel  75 mg Oral Daily   fluticasone  1 spray Each Nare Daily   hydrOXYzine  25 mg Oral TID   [START ON 03/20/2024] influenza vac split trivalent PF  0.5 mL Intramuscular Tomorrow-1000   insulin aspart  0-15 Units Subcutaneous TID WC   insulin aspart  4 Units Subcutaneous TID WC   metoprolol tartrate  25 mg Oral BID   pantoprazole  40 mg Oral Daily   pantoprazole  40 mg Oral Daily   pioglitazone  30 mg Oral Daily   potassium chloride  20-40 mEq Oral Once   sodium chloride flush  3 mL Intravenous Q12H   sodium chloride flush  3 mL Intravenous Q12H   Infusions:   sodium chloride     sodium chloride     PRN: sodium chloride, acetaminophen, alum & mag  hydroxide-simeth, diphenhydrAMINE, guaiFENesin-dextromethorphan, hydrALAZINE, hydrALAZINE, HYDROmorphone (DILAUDID) injection, labetalol, metoprolol tartrate, morphine injection, ondansetron, oxyCODONE-acetaminophen, phenol, sodium chloride flush, sodium chloride flush  Assessment: 63 yom with a history of CAD, DM, HTN, PAD, pancreatitis. Patient is presenting with L leg pain, pt had a vascular surgery on 3/18. EMS unable to palpate pule on left foot. Heparin per pharmacy consult placed for  ischemic leg . Patient is on apixaban prior to arrival (Last dose 3/31 AM per patient).   S/p left LE angiogram with occluded SFA stenting (placed 02/24/24) now with cath-directed lysis.  4/3 s/p lysis check with removal of EKOS catheter - heparin to resume 8hr after.  Labs this AM:  HL 0.22/Fibrinogen 406.   Goal of Therapy:  Heparin level 0.3-0.5 units/ml aPTT 66-102 seconds Monitor platelets by anticoagulation protocol: Yes   Plan:  At 23:00, resume heparin 800 units/hr (no bolus) fixed rate per [facility] Check 6hr Heparin level and aPTT with recent DOAC use F/u oral anticoagulation after EKOS (Eliquis PTA for PAD)  Loralee Pacas, PharmD, BCPS 03/19/2024 3:59 PM

## 2024-03-19 NOTE — TOC Benefit Eligibility Note (Signed)
 Pharmacy Patient Advocate Encounter  Insurance verification completed.    The patient is insured through  [employer] Part D . Patient has Medicare and is not eligible for a copay card, but may be able to apply for patient assistance or Medicare RX Payment Plan (Patient Must reach out to their plan, if eligible for payment plan), if available.    Ran test claim for Eliquis and the current 30 day co-pay is $0.   This test claim was processed through [facility]- copay amounts may vary at other pharmacies due to [employer], or as the patient moves through the different stages of their insurance plan.

## 2024-03-19 NOTE — Progress Notes (Signed)
  Daily Progress Note   Subjective: Complaining of left lower extremity pain this morning.  Some numbness at the ankle and toes  Objective: Vitals:   03/19/24 0700 03/19/24 0759  BP:    Pulse:    Resp:    Temp: 98 F (36.7 C) 98.1 F (36.7 C)  SpO2:      Physical Examination No hematoma in the right groin Signals in the left foot-monophasic posterior tibial, much improved from previous Nonlabored breathing Alert and oriented x 3  ASSESSMENT/PLAN:  Patient is a 63 year old male who came with acute limb ischemia now undergoing pharmacomechanical thrombolysis with EKOS catheter in the left superficial femoral artery stents.  Signal much improved today.  Continues to have some numbness in the left foot.  No weakness.  With signal in the foot, the previous SFA stents have recanalized.  Plan for lysis catheter recheck today. NPO   Victorino Sparrow MD MS [facility] [phone] 03/19/2024  8:14 AM

## 2024-03-19 NOTE — Op Note (Signed)
 Patient name: Donald Palmer MRN: [id number] DOB: 1961-07-18 Sex: male  03/19/2024 Pre-operative Diagnosis: Left lower extremity ischemia with occluded left SFA/popliteal artery stents Post-operative diagnosis:  Same Surgeon:  Cephus Shelling, MD Procedure Performed: 1.  Thrombolytics catheter check left lower extremity with removal EKOS catheter 2.  Percutaneous thrombectomy left common femoral artery (penumbra CAT 7) 3.  Angioplasty of proximal left SFA stent (5 mm Mustang) 4.  Angioplasty of distal left above-knee popliteal stent (5 mm x 40 mm drug-coated In*pact) 5.  Mynx closure right common femoral artery 6.  39 minutes of monitored moderate conscious sedation time  Indications: Patient is a 63 year old male who presents for thrombolysis catheter check for occluded left SFA popliteal stents previously placed on 02/24/2024.  He presents after risks and benefits discussed.    Findings:   I placed a V18 wire down the left leg EKOS catheter and the catheter was removed.  Angiogram imaging showed large posterior plaque in the left common femoral artery with some acute thrombus.  The left SFA above-knee popliteal stents were widely patent.  Ultimately the left common femoral thrombus was retrieved with percutaneous suction thrombectomy using penumbra CAT 7.  I then treated the proximal left SFA stent with a 5 mm Mustang as there was concern for proximal cap yesterday although this with widely patent today.  Distally there was some haziness in the distal stent in the above-knee popliteal artery and this was treated with a 5 mm drug-coated balloon.  Widely patent stents at completion with three-vessel runoff.  Mynx closure of the right common femoral artery.   Procedure:  The patient was identified in the holding area and taken to room 8.  The patient was then placed supine on the table and prepped and draped in the usual sterile fashion.  A time out was called.  The patient received Versed and  fentanyl for conscious moderate sedation.  Vital signs were monitored including heart rate, respiratory, oxygenation and blood pressure.  I then put a V18 wire down the EKOS catheter into the left peroneal artery and the EKOS catheter was removed.  We then got left lower extremity angiogram images with pertinent findings noted above.  There appeared to be some acute thrombus in the left common femoral artery associated with a posterior chronic plaque.  The stents themselves were patent.  I then used a Glidewire advantage down the left SFA stents and upsized to a 7 [place] catapult sheath in the right groin over the aortic bifurcation.  Patient was given additional 5000 units of IV heparin.  We checked an ACT to maintain greater than 250.  I then used a CAT 7 and made about 4 passes in the left common femoral artery retrieving a small plug of thrombus.  Imaging showed what look like just residual chronic disease in the artery.  I then went ahead and got my Glidewire advantage on down the left SFA popliteal stents.  I did treat the left proximal SFA stent with a 5 mm angioplasty balloon for 2 minutes as there was concern for proximal cap yesterday.  This inflated easily with no waste.  I then went distally and there was some haziness at the distal extent of the stents in the above-knee popliteal artery and this was treated with a 5 mm drug coated balloon angioplasty.  We do have widely patent stents with three-vessel runoff.  Wires and catheters were removed.  Short 7 French sheath was placed in the right groin  and we got a groin shot.  A mynx closure was deployed.  Plan: Will resume heparin 8 hours without bolus.  Aspirin Plavix.   Widely patent left leg stents.  Cephus Shelling, MD [facility] [place] Office: [phone]

## 2024-03-20 ENCOUNTER — Encounter (HOSPITAL_COMMUNITY): Payer: Self-pay | Admitting: Vascular Surgery

## 2024-03-20 DIAGNOSIS — Z95828 Presence of other vascular implants and grafts: Secondary | ICD-10-CM

## 2024-03-20 DIAGNOSIS — Z9889 Other specified postprocedural states: Secondary | ICD-10-CM

## 2024-03-20 LAB — BASIC METABOLIC PANEL WITH GFR
Anion gap: 6 (ref 5–15)
BUN: 8 mg/dL (ref 8–23)
CO2: 28 mmol/L (ref 22–32)
Calcium: 8.9 mg/dL (ref 8.9–10.3)
Chloride: 104 mmol/L (ref 98–111)
Creatinine, Ser: 1.15 mg/dL (ref 0.61–1.24)
GFR, Estimated: >60 mL/min (ref >60)
Glucose, Bld: 139 mg/dL — ABNORMAL HIGH (ref 70–99)
Potassium: 4.1 mmol/L (ref 3.5–5.1)
Sodium: 138 mmol/L (ref 135–145)

## 2024-03-20 LAB — APTT: aPTT: 45 s — ABNORMAL HIGH (ref 24–36)

## 2024-03-20 LAB — CBC
HCT: 36.2 % — ABNORMAL LOW (ref 39.0–52.0)
Hemoglobin: 11 g/dL — ABNORMAL LOW (ref 13.0–17.0)
MCH: 21.4 pg — ABNORMAL LOW (ref 26.0–34.0)
MCHC: 30.4 g/dL (ref 30.0–36.0)
MCV: 70.6 fL — ABNORMAL LOW (ref 80.0–100.0)
Platelets: 214 10*3/uL (ref 150–400)
RBC: 5.13 MIL/uL (ref 4.22–5.81)
RDW: 15.9 % — ABNORMAL HIGH (ref 11.5–15.5)
WBC: 5.3 10*3/uL (ref 4.0–10.5)
nRBC: 0 % (ref 0.0–0.2)

## 2024-03-20 LAB — GLUCOSE, CAPILLARY
Glucose-Capillary: 88 mg/dL (ref 70–99)
Glucose-Capillary: 139 mg/dL — ABNORMAL HIGH (ref 70–99)
Glucose-Capillary: 112 mg/dL — ABNORMAL HIGH (ref 70–99)

## 2024-03-20 LAB — HEPARIN LEVEL (UNFRACTIONATED): Heparin Unfractionated: 0.11 [IU]/mL — ABNORMAL LOW (ref 0.30–0.70)

## 2024-03-20 LAB — POCT ACTIVATED CLOTTING TIME: Activated Clotting Time: 245 s

## 2024-03-20 MED ORDER — APIXABAN 5 MG PO TABS
5.0000 mg | ORAL_TABLET | Freq: Two times a day (BID) | ORAL | Status: DC
  Administered 2024-03-20: 5 mg via ORAL
  Filled 2024-03-20: qty 1

## 2024-03-20 NOTE — Progress Notes (Addendum)
 Progress Note    03/20/2024 8:28 AM 1 Day Post-Op  Subjective: Says his left foot feels much better.  Denies any pain    Vitals:   03/20/24 0600 03/20/24 0800  BP: 108/69 106/67  Pulse: 68 86  Resp: 18 16  Temp:    SpO2: 100% (!) 80%    Physical Exam: General: Resting comfortably Lungs: Nonlabored Incisions: Right groin cath site dry and intact without hematoma Extremities: Left lower extremity warm and well-perfused with palpable PT pulse.  Intact motor and sensation of left foot.  No evidence of left lower extremity compartment syndrome  CBC    Component Value Date/Time   WBC 5.3 03/20/2024 0235   RBC 5.13 03/20/2024 0235   HGB 11.0 (L) 03/20/2024 0235   HGB 11.0 (L) 12/02/2023 1442   HCT 36.2 (L) 03/20/2024 0235   HCT 35.7 (L) 12/02/2023 1442   PLT 214 03/20/2024 0235   PLT 728 (H) 12/02/2023 1442   MCV 70.6 (L) 03/20/2024 0235   MCV 69 (L) 12/02/2023 1442   MCH 21.4 (L) 03/20/2024 0235   MCHC 30.4 03/20/2024 0235   RDW 15.9 (H) 03/20/2024 0235   RDW 13.7 12/02/2023 1442   LYMPHSABS 3.4 03/17/2024 1912   LYMPHSABS 2.7 10/11/2023 1307   MONOABS 0.9 03/17/2024 1912   EOSABS 0.1 03/17/2024 1912   EOSABS 0.2 10/11/2023 1307   BASOSABS 0.1 03/17/2024 1912   BASOSABS 0.1 10/11/2023 1307    BMET    Component Value Date/Time   NA 138 03/20/2024 0235   NA 142 12/02/2023 1442   K 4.1 03/20/2024 0235   CL 104 03/20/2024 0235   CO2 28 03/20/2024 0235   GLUCOSE 139 (H) 03/20/2024 0235   BUN 8 03/20/2024 0235   BUN 7 (L) 12/02/2023 1442   CREATININE 1.15 03/20/2024 0235   CREATININE 0.77 10/28/2014 1112   CALCIUM 8.9 03/20/2024 0235   GFRNONAA >60 03/20/2024 0235   GFRNONAA >89 10/28/2014 1112   GFRAA 87 04/28/2020 0944   GFRAA >89 10/28/2014 1112    INR    Component Value Date/Time   INR 1.2 11/13/2023 1227     Intake/Output Summary (Last 24 hours) at 03/20/2024 0828 Last data filed at 03/20/2024 0800 Gross per 24 hour  Intake 1211.88 ml  Output  1640 ml  Net -428.12 ml      Assessment/Plan:  63 y.o. male is 1 day postop, s/p: Lysis recheck, left common femoral artery thrombectomy, and angioplasty of proximal left SFA stent and distal left AK pop stent 1 Day Post-Op   -The patient says that he feels much better this morning.  His numbness is much better in the left leg.  He denies any pain. -His right groin cath site is intact and dry without hematoma -His left lower extremity is well-perfused with a palpable PT pulse.  He has intact motor and sensation in the left leg and foot.  No evidence of compartment syndrome on the left, with soft calf muscle compartments -Successful recanalization of occluded left SFA popliteal stents -On heparin overnight, will transition back to home Eliquis today with pharmacy assistance.  Continue Plavix -Stable for discharge home today.  Will plan for 4-week follow-up with Dr. Randie Heinz with left lower extremity arterial duplex and ABIs   Loel Dubonnet, PA-C [facility] [phone] 03/20/2024 8:28 AM    I have seen and evaluated the patient. I agree with the PA note as documented above.  Postprocedure day 1 status post left leg thrombolysis  of occluded SFA popliteal stents that involved percutaneous thrombectomy and additional angioplasty.  Today right groin access site looks good with no hematoma.  He has palpable left DP PT pulse.  States he needs to leave the hospital for personal issues.  Discussed transition back to Plavix plus Eliquis.  Will arrange follow-up in 4 weeks with left leg arterial duplex and ABIs with Dr. Randie Heinz.  Plan discharge today.  Cephus Shelling, MD [facility] [place] Office: [phone]

## 2024-03-24 NOTE — Discharge Summary (Signed)
 Discharge Summary  [name] [id number] 63 y.o. [birth year]  Admit date: 03/17/2024  Discharge date and time: 03/20/2024 10:31 AM   Admitting Physician: Maeola Harman, MD   Discharge Physician: Maeola Harman, MD   Admission Diagnoses: Left leg pain [M79.605] Acute lower limb ischemia [I99.8]  Discharge Diagnoses:  Left leg pain [M79.605] Acute lower limb ischemia [I99.8]  Admission Condition: fair  Discharged Condition: good  Indication for Admission: Patient is a 63 year old male with history of PAD, most recently having undergone SFA popliteal artery stenting on 02/24/2024. The patient recently went out of town and stopped taking his medications including Plavix and Xarelto. He presents with new onset rest pain in the left lower extremity with imaging demonstrating occluded stents. After discussing risks and benefits of pharmacomechanical thrombolysis, Donald Palmer elected to proceed.   Hospital Course: The patient was admitted to the hospital on 03/17/2024 with left lower extremity rest pain.  He was found to have occluded left lower extremity SFA stents.  He was taken to the Cath Lab on 03/18/2024 and underwent left lower extremity angiogram with initiation of lysis therapy.  He tolerated the procedure well and was taken to the ICU overnight for monitoring.  He received tPA into his lysis sheath overnight.  He was taken back to the Cath Lab on 03/19/2024 for repeat left lower extremity angiogram, left common femoral artery percutaneous thrombectomy, and balloon angioplasty of the proximal left SFA stent and distal left above-knee popliteal artery stent.  The sheath was removed from his right groin.  He was taken to PACU in stable condition.  He was then transferred back to the ICU for overnight monitoring.  IV heparin was restarted that evening.  On 4/4 he was doing much better without any rest pain in the left leg.  He had an easily palpable left PT pulse.  He was  transitioned off of IV heparin back to his home Eliquis.  He is tolerating a normal diet and mobilizing without difficulty.  He had no bleeding from his right groin sheath exit site.  He was discharged home on 4/4.  Consults: None  Treatments: IV hydration, analgesia: percocet, anticoagulation: heparin and eliquis, and surgery:   03/18/2024- left lower extremity angiogram with initiation of lysis 03/19/2024-left lower extremity angiogram, left common femoral artery thrombectomy, and angioplasty of proximal left SFA stent and distal left above-knee popliteal artery stent  Discharge Exam: See progress note  Vitals:   03/20/24 0600 03/20/24 0800  BP: 108/69 106/67  Pulse: 68 86  Resp: 18 16  Temp:    SpO2: 100% (!) 80%     Disposition: Discharge disposition: 01-Home or Self Care       Patient Instructions:  Allergies as of 03/20/2024       Reactions   Metformin And Related Other (See Comments)   GI side effects. Stopped 2016        Medication List     TAKE these medications    Accu-Chek Guide w/Device Kit Check blood sugar 2 times a day   Accu-Chek Softclix Lancets lancets check blood sugar 2 times a day   acetaminophen 500 MG tablet Commonly known as: TYLENOL Take 1,000 mg by mouth every 6 (six) hours as needed for mild pain (pain score 1-3) or headache.   apixaban 5 MG Tabs tablet Commonly known as: ELIQUIS Take 1 tablet (5 mg total) by mouth 2 (two) times daily.   atorvastatin 80 MG tablet Commonly known as: LIPITOR Take 1  tablet (80 mg total) by mouth daily at 6 PM.   clopidogrel 75 MG tablet Commonly known as: PLAVIX Take 1 tablet (75 mg total) by mouth daily.   colchicine 0.6 MG tablet Take 1 tablet (0.6 mg total) by mouth daily as needed (gout flare).   metoprolol tartrate 25 MG tablet Commonly known as: LOPRESSOR Take 1 tablet (25 mg total) by mouth 2 (two) times daily.   omeprazole 40 MG capsule Commonly known as: PRILOSEC Take 1 capsule (40  mg total) by mouth daily.   Pen Needles 32G X 4 MM Misc Use to inject liraglutide once a day   pioglitazone 30 MG tablet Commonly known as: Actos Take 1 tablet (30 mg total) by mouth daily.   tadalafil 10 MG tablet Commonly known as: Cialis Take 1 tablet (10 mg total) by mouth as needed for erectile dysfunction.       Activity: activity as tolerated, no driving while on analgesics, and no heavy lifting for 4 weeks Diet: low fat, low cholesterol diet Wound Care: keep wound clean and dry  Follow-up with Dr.Cain in 4 weeks.  SignedLoel Dubonnet, PA-C 03/24/2024 2:11 PM [facility] Office: [phone]

## 2024-03-25 ENCOUNTER — Telehealth: Payer: Self-pay

## 2024-03-25 NOTE — Telephone Encounter (Signed)
 Medication refill: -pt called requesting pain medication, then in person visit in the office. -on chart review pt reported no pain at discharge.  -Nurse visit- pt states that his foot was hurting but he was in a rush when he was being discharge.  Describes pain as burning/tingling in the foot only.  That his calf and leg don't hurt at all.  -pt aware that his foot is likely adjusting to having blood flow and it may improve with time.   -consulted with PA, groin access for procedure (no incisions) no prescription given and pt advised to use scheduled Tylenol

## 2024-03-26 ENCOUNTER — Ambulatory Visit: Admitting: Student

## 2024-03-26 VITALS — BP 103/64 | HR 97 | Temp 98.4°F | Ht 68.0 in | Wt 170.2 lb

## 2024-03-26 DIAGNOSIS — I998 Other disorder of circulatory system: Secondary | ICD-10-CM

## 2024-03-26 MED ORDER — PREGABALIN 150 MG PO CAPS: 150.0000 mg | ORAL_CAPSULE | Freq: Two times a day (BID) | ORAL | 0 refills | Status: DC

## 2024-03-26 NOTE — Progress Notes (Unsigned)
 Acute Office Visit  Subjective:     [name], male    DOB: [birth year], 63 y.o.   MRN: [id number]  Chief Complaint  Patient presents with   Leg Pain    Left leg pain 10/10 requests something for pain     HPI Patient is in today for Leg pain. Please see problem based assessment and plan for additional details.   ROS    As per Assessment and Plan  Objective:    BP 103/64 (BP Location: Right Arm, Patient Position: Sitting, Cuff Size: Small)   Pulse 97   Temp 98.4 F (36.9 C) (Oral)   Ht 5\' 8"  (1.727 m)   Wt 170 lb 3.2 oz (77.2 kg)   SpO2 99%   BMI 25.88 kg/m  BP Readings from Last 3 Encounters:  03/26/24 103/64  03/20/24 106/67  03/04/24 114/69   Wt Readings from Last 3 Encounters:  03/26/24 170 lb 3.2 oz (77.2 kg)  03/17/24 175 lb 4.8 oz (79.5 kg)  03/04/24 175 lb 4.8 oz (79.5 kg)      Physical Exam  General: Sitting in chair, no acute distress Cardiovascular: Regular rate, no murmurs appreciated Pulmonary: Breathing comfortably, no wheezing or crackles Abdomen: Soft, nontender, nondistended, bowel sounds present MSK: + Tenderness to palpation Left 1st and 2nd toe, 1 and 2 metatarsal, no edema or redness noted. 2+ dp pulses intact b/l LE   No results found for any visits on 03/26/24.      Assessment & Plan:  Patient is discussed with Dr Lafonda Mosses   Problem List Items Addressed This Visit       Cardiovascular and Mediastinum   Acute lower limb ischemia - Primary   Recently admitted 03/17/2024 for Left leg pain due to acute lower limb ischemia and hx of PAD, undergone SFA popliteal artery stenting on 02/24/2024, he went out of town and stopped taking Plavix and Xarelto, had imaging demonstrating occluded stents. Cath Lab on 03/18/2024 and underwent left lower extremity angiogram with initiation of lysis therapy. Cath Lab on 03/19/2024 for repeat left lower extremity angiogram, left common femoral artery percutaneous thrombectomy, and balloon  angioplasty of the proximal left SFA stent and distal left above-knee popliteal artery stent. The sheath was removed from his right groin.  Presents today for tenderness Left 1st and 2nd toe with metatarsal. Describes it as burning and sharp pain along area. Reports that he is taking his medications, though unable to tell which one. He does report that he has history of gout. Dopplar was used for detection of the dorsal pedis pulses of the L foot, which were intact. Per chart review, he was previously prescribed Lyrica. His pain appears to be more neuropathic than gout.   To help patient with multiple of medications, he is given a pill box to organize all the medications.   - Continue Eliquis 5 mg BID - Continue Lipitor 80 mg daily at evening - Continue Plavix 75 mg daily  - Start Lyrica 150 mg BID for neuropathic pain - If symptoms do not improve in two weeks, patient is advised to follow back with [facility]        Meds ordered this encounter  Medications   pregabalin (LYRICA) 150 MG capsule    Sig: Take 1 capsule (150 mg total) by mouth 2 (two) times daily.    Dispense:  60 capsule    Refill:  0    Return in about 2 months (around 05/26/2024) for HTN and  A1c .  Jeral Pinch, DO

## 2024-03-26 NOTE — Patient Instructions (Signed)
 Thank you, [name] for allowing [place] to provide your care today. Today we discussed:  For your Left Leg: Take the following medications -Take Eliquis, 1 tablet by mouth 2 times a day -Take Plavix, 1 tablet by mouth once daily - Take Lipitor, 1 tablet by mouth daily in the evening - START LYRICA, 1 tablet by mouth two times daily   I have ordered the following labs for you:  Lab Orders  No laboratory test(s) ordered today     Tests ordered today:  None   Referrals ordered today:   Referral Orders  No referral(s) requested today     I have ordered the following medication/changed the following medications:   Stop the following medications: There are no discontinued medications.   Start the following medications: Meds ordered this encounter  Medications   pregabalin (LYRICA) 150 MG capsule    Sig: Take 1 capsule (150 mg total) by mouth 2 (two) times daily.    Dispense:  60 capsule    Refill:  0     Follow up: 2-3 months HTN and A1c    Remember:   Should you have any questions or concerns please call the internal medicine clinic at [phone].     Jeral Pinch, DO [facility]

## 2024-03-27 NOTE — Assessment & Plan Note (Signed)
 Recently admitted 03/17/2024 for Left leg pain due to acute lower limb ischemia and hx of PAD, undergone SFA popliteal artery stenting on 02/24/2024, he went out of town and stopped taking Plavix and Xarelto, had imaging demonstrating occluded stents. Cath Lab on 03/18/2024 and underwent left lower extremity angiogram with initiation of lysis therapy. Cath Lab on 03/19/2024 for repeat left lower extremity angiogram, left common femoral artery percutaneous thrombectomy, and balloon angioplasty of the proximal left SFA stent and distal left above-knee popliteal artery stent. The sheath was removed from his right groin.  Presents today for tenderness Left 1st and 2nd toe with metatarsal. Describes it as burning and sharp pain along area. Reports that he is taking his medications, though unable to tell which one. He does report that he has history of gout. Dopplar was used for detection of the dorsal pedis pulses of the L foot, which were intact. Per chart review, he was previously prescribed Lyrica. His pain appears to be more neuropathic than gout.   To help patient with multiple of medications, he is given a pill box to organize all the medications.   - Continue Eliquis 5 mg BID - Continue Lipitor 80 mg daily at evening - Continue Plavix 75 mg daily  - Start Lyrica 150 mg BID for neuropathic pain - If symptoms do not improve in two weeks, patient is advised to follow back with [facility]

## 2024-03-27 NOTE — Progress Notes (Signed)
Internal Medicine Clinic Attending  I was physically present during the key portions of the resident provided service and participated in the medical decision making of patient's management care. I reviewed pertinent patient test results.  The assessment, diagnosis, and plan were formulated together and I agree with the documentation in the resident's note.  Mercie Eon, MD

## 2024-04-17 ENCOUNTER — Other Ambulatory Visit: Payer: Self-pay | Admitting: *Deleted

## 2024-04-17 DIAGNOSIS — I739 Peripheral vascular disease, unspecified: Secondary | ICD-10-CM

## 2024-04-17 DIAGNOSIS — I70222 Atherosclerosis of native arteries of extremities with rest pain, left leg: Secondary | ICD-10-CM

## 2024-04-21 ENCOUNTER — Encounter: Payer: Self-pay | Admitting: Internal Medicine

## 2024-04-22 ENCOUNTER — Ambulatory Visit: Attending: Vascular Surgery | Admitting: Physician Assistant

## 2024-04-22 ENCOUNTER — Encounter: Payer: Self-pay | Admitting: Physician Assistant

## 2024-04-22 ENCOUNTER — Ambulatory Visit (HOSPITAL_BASED_OUTPATIENT_CLINIC_OR_DEPARTMENT_OTHER)
Admission: RE | Admit: 2024-04-22 | Discharge: 2024-04-22 | Disposition: A | Discharge status: Home / Self Care | Source: Ambulatory Visit | Attending: Vascular Surgery | Admitting: Vascular Surgery

## 2024-04-22 ENCOUNTER — Ambulatory Visit (HOSPITAL_COMMUNITY)
Admission: RE | Admit: 2024-04-22 | Discharge: 2024-04-22 | Disposition: A | Discharge status: Home / Self Care | Source: Ambulatory Visit | Attending: Vascular Surgery | Admitting: Vascular Surgery

## 2024-04-22 VITALS — BP 144/76 | HR 55 | Temp 98.0°F | Wt 176.8 lb

## 2024-04-22 DIAGNOSIS — I70222 Atherosclerosis of native arteries of extremities with rest pain, left leg: Secondary | ICD-10-CM | POA: Insufficient documentation

## 2024-04-22 DIAGNOSIS — I739 Peripheral vascular disease, unspecified: Secondary | ICD-10-CM | POA: Insufficient documentation

## 2024-04-22 LAB — VAS US ABI WITH/WO TBI
Left ABI: 0.91
Right ABI: 0.9

## 2024-04-22 NOTE — Progress Notes (Signed)
 HISTORY AND PHYSICAL     CC:  follow up. Requesting Provider:  Jonelle Neri, DO  HPI: This is a 63 y.o. male who is here today for follow up for PAD.  Pt has hx of aortogram with stent to the left SFA via right CFA on 02/24/2024 by Dr. Vikki Graves.  He underwent thrombolysis of LLE for acute ischemia on 03/18/2024 with recheck and percutaneous thrombectomy of left CFA, angioplasty of proximal left SFA, distal AK popliteal artery and stenting on 03/19/2024 by Dr. Fulton Job.  He has hx of left SFA atherectomy and angioplasty in 2020 by Dr. Katheryne Pane.  He has hx of CABG on 11/13/2023 by Dr. Deloise Ferries.   The pt returns today for follow up.  He states he continues to pain and numbness in the left foot.  He states that it is not better or worse.  He denies any claudication with walking.  He states he was started on Lyrica  and this has helped him.  He has a small scab on the left 3rd toe that he says he scratched and it is improving.    The pt is on a statin for cholesterol management.    The pt is not on an aspirin .    Other AC:  Plavix  The pt is on BB for hypertension.  The pt is  on medication for diabetes. Tobacco hx:  current    Past Medical History:  Diagnosis Date   Back pain, chronic    Coronary artery disease    Diabetes mellitus 2007   Hepatitis C    [id number]   History of chronic hepatitis C 05/13/2012   First noted 2013. RF inc tattoos obtained while incarcerated. [place]  2016 SVR 2017    Hypertension goal BP (blood pressure) < 140/80    Microcytic anemia    Neuromuscular disorder (HCC)    PAD (peripheral artery disease) (HCC)    Pancreatitis 01/09/2012    Past Surgical History:  Procedure Laterality Date   ABDOMINAL AORTOGRAM N/A 03/03/2018   Procedure: ABDOMINAL AORTOGRAM;  Surgeon: Avanell Leigh, MD;  Location: [facility];  Service: Cardiovascular;  Laterality: N/A;   ABDOMINAL AORTOGRAM W/LOWER EXTREMITY N/A 06/15/2019   Procedure: ABDOMINAL AORTOGRAM W/LOWER EXTREMITY;   Surgeon: Avanell Leigh, MD;  Location: [facility];  Service: Cardiovascular;  Laterality: N/A;   ABDOMINAL AORTOGRAM W/LOWER EXTREMITY N/A 09/09/2023   Procedure: ABDOMINAL AORTOGRAM W/LOWER EXTREMITY;  Surgeon: Adine Hoof, MD;  Location: [facility] INVASIVE CV LAB;  Service: Cardiovascular;  Laterality: N/A;   ABDOMINAL AORTOGRAM W/LOWER EXTREMITY N/A 02/24/2024   Procedure: ABDOMINAL AORTOGRAM W/LOWER EXTREMITY;  Surgeon: Adine Hoof, MD;  Location: [facility] INVASIVE CV LAB;  Service: Cardiovascular;  Laterality: N/A;   ABDOMINAL AORTOGRAM W/LOWER EXTREMITY N/A 03/18/2024   Procedure: ABDOMINAL AORTOGRAM W/LOWER EXTREMITY;  Surgeon: Kayla Part, MD;  Location: [facility] INVASIVE CV LAB;  Service: Cardiovascular;  Laterality: N/A;   CORONARY ARTERY BYPASS GRAFT N/A 11/13/2023   Procedure: CORONARY ARTERY BYPASS GRAFTING X 3, USING LEFT INTERNAL MAMMARY ARTERY AND ENDOSCOPICALLY HARVESTED RIGHT SAPHENOUS VEIN GRAFT;  Surgeon: Hilarie Lovely, MD;  Location: [facility] OR;  Service: Open Heart Surgery;  Laterality: N/A;   I & D EXTREMITY Right 05/15/2016   Procedure: RIGHT INDEX FINGER FIRST THROUGH  MIDDLE PHALYNX  AMPUTATION;  Surgeon: Rober Chimera, MD;  Location: [facility] OR;  Service: Orthopedics;  Laterality: Right;   LEFT HEART CATH AND CORONARY ANGIOGRAPHY N/A 10/16/2023   Procedure: LEFT HEART CATH AND CORONARY  ANGIOGRAPHY;  Surgeon: Cody Das, MD;  Location: [facility];  Service: Cardiovascular;  Laterality: N/A;   LOWER EXTREMITY INTERVENTION Bilateral 03/03/2018   Procedure: LOWER EXTREMITY INTERVENTION;  Surgeon: Avanell Leigh, MD;  Location: [facility];  Service: Cardiovascular;  Laterality: Bilateral;   LOWER EXTREMITY INTERVENTION  02/24/2024   Procedure: LOWER EXTREMITY INTERVENTION;  Surgeon: Adine Hoof, MD;  Location: [facility] INVASIVE CV LAB;  Service: Cardiovascular;;   LOWER EXTREMITY INTERVENTION  03/18/2024   Procedure: LOWER  EXTREMITY INTERVENTION;  Surgeon: Kayla Part, MD;  Location: [facility] INVASIVE CV LAB;  Service: Cardiovascular;;   OPEN REDUCTION INTERNAL FIXATION (ORIF) DISTAL PHALANX Right 04/02/2016   Procedure: RIGHT INDEX FINGER REPAIR;  Surgeon: Rober Chimera, MD;  Location: [facility];  Service: Orthopedics;  Laterality: Right;   PERIPHERAL VASCULAR ATHERECTOMY Left 03/03/2018   Procedure: PERIPHERAL VASCULAR ATHERECTOMY;  Surgeon: Avanell Leigh, MD;  Location: [facility] INVASIVE CV LAB;  Service: Cardiovascular;  Laterality: Left;  SFA WITH PTA DRUG COATED BALLOON   PERIPHERAL VASCULAR INTERVENTION Left 06/15/2019   Procedure: PERIPHERAL VASCULAR INTERVENTION;  Surgeon: Avanell Leigh, MD;  [facility];  Service: Cardiovascular;  Laterality: Left;   PERIPHERAL VASCULAR THROMBECTOMY N/A 03/19/2024   Procedure: PERIPHERAL VASCULAR THROMBECTOMY;  Surgeon: Young Hensen, MD;  Location: [facility];  Service: Cardiovascular;  Laterality: N/A;   TEE WITHOUT CARDIOVERSION N/A 11/13/2023   Procedure: TRANSESOPHAGEAL ECHOCARDIOGRAM;  Surgeon: Hilarie Lovely, MD;  Location: [facility] OR;  Service: Open Heart Surgery;  Laterality: N/A;    Allergies  Allergen Reactions   Metformin  And Related Other (See Comments)    GI side effects. Stopped 2016    Current Outpatient Medications  Medication Sig Dispense Refill   Accu-Chek Softclix Lancets lancets check blood sugar 2 times a day 100 each 5   acetaminophen  (TYLENOL ) 500 MG tablet Take 1,000 mg by mouth every 6 (six) hours as needed for mild pain (pain score 1-3) or headache.     apixaban  (ELIQUIS ) 5 MG TABS tablet Take 1 tablet (5 mg total) by mouth 2 (two) times daily. 60 tablet 1   atorvastatin  (LIPITOR ) 80 MG tablet Take 1 tablet (80 mg total) by mouth daily at 6 PM. 90 tablet 3   Blood Glucose Monitoring Suppl (ACCU-CHEK GUIDE) w/Device KIT Check blood sugar 2 times a day 1 kit 1   clopidogrel  (PLAVIX ) 75 MG tablet  Take 1 tablet (75 mg total) by mouth daily. 90 tablet 3   colchicine  0.6 MG tablet Take 1 tablet (0.6 mg total) by mouth daily as needed (gout flare). 30 tablet 2   Insulin  Pen Needle (PEN NEEDLES) 32G X 4 MM MISC Use to inject liraglutide  once a day 100 each 3   metoprolol  tartrate (LOPRESSOR ) 25 MG tablet Take 1 tablet (25 mg total) by mouth 2 (two) times daily. 60 tablet 2   omeprazole  (PRILOSEC) 40 MG capsule Take 1 capsule (40 mg total) by mouth daily. (Patient not taking: Reported on 03/17/2024) 90 capsule 3   pioglitazone  (ACTOS ) 30 MG tablet Take 1 tablet (30 mg total) by mouth daily. 30 tablet 11   pregabalin  (LYRICA ) 150 MG capsule Take 1 capsule (150 mg total) by mouth 2 (two) times daily. 60 capsule 0   tadalafil  (CIALIS ) 10 MG tablet Take 1 tablet (10 mg total) by mouth as needed for erectile dysfunction. (Patient not taking: Reported on 03/17/2024) 20 tablet 1   No  current facility-administered medications for this visit.    Family History  Problem Relation Age of Onset   Diabetes Mother    Hypertension Mother    Hyperlipidemia Mother    Asthma Sister    Obesity Sister    Diabetes Brother    Diabetes Brother    Diabetes Daughter    Heart attack Neg Hx    Sudden death Neg Hx     Social History   Socioeconomic History   Marital status: Single    Spouse name: Not on file   Number of children: 4   Years of education: Not on file   Highest education level: 12th grade  Occupational History   Not on file  Tobacco Use   Smoking status: Some Days    Current packs/day: 0.10    Average packs/day: 0.1 packs/day for 30.0 years (3.0 ttl pk-yrs)    Types: Cigarettes, Cigars   Smokeless tobacco: Never   Tobacco comments:    Per pt, 3 cigarettes per day, as of 11/11/23  Vaping Use   Vaping status: Never Used  Substance and Sexual Activity   Alcohol  use: Not Currently    Alcohol /week: 5.0 standard drinks of alcohol     Types: 5 Cans of beer per week    Comment: Beer.   Drug  use: Not Currently    Frequency: 1.0 times per week    Types: Marijuana    Comment: rarely   Sexual activity: Yes  Other Topics Concern   Not on file  Social History Narrative   Current Social History 08/15/2021        Patient lives with his daughter in a home which is 1 story. There are 8 steps up to the entrance the patient uses with a railing      Patient's method of transportation is personal car.      The highest level of education was some high school.      The patient currently disabled.      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"       Current Stressors: "money"       Religious / Personal Beliefs: "no"       Social Drivers of Corporate investment banker Strain: Medium Risk (01/06/2024)   Overall Financial Resource Strain (CARDIA)    Difficulty of Paying Living Expenses: Somewhat hard  Food Insecurity: No Food Insecurity (03/18/2024)   Hunger Vital Sign    Worried About Running Out of Food in the Last Year: Never true    Ran Out of Food in the Last Year: Never true  Recent Concern: Food Insecurity - Food Insecurity Present (01/06/2024)   Hunger Vital Sign    Worried About Running Out of Food in the Last Year: Sometimes true    Ran Out of Food in the Last Year: Never true  Transportation Needs: No Transportation Needs (03/18/2024)   PRAPARE - Administrator, Civil Service (Medical): No    Lack of Transportation (Non-Medical): No  Physical Activity: Not on file  Stress: Not on file  Social Connections: Not on file  Intimate Partner Violence: Not At Risk (03/18/2024)   Humiliation, Afraid, Rape, and Kick questionnaire    Fear of Current or Ex-Partner: No    Emotionally Abused: No    Physically Abused: No    Sexually Abused: No  REVIEW OF SYSTEMS:   [X]  denotes positive finding, [ ]  denotes negative finding Cardiac  Comments:  Chest pain or chest  pressure:    Shortness of breath upon exertion:    Short of breath when lying flat:    Irregular heart rhythm:        Vascular    Pain in calf, thigh, or hip brought on by ambulation:    Pain/numbness in left foot x See HPI  Blood clot in your veins:    Leg swelling:         Pulmonary    Oxygen at home:    Productive cough:     Wheezing:         Neurologic    Sudden weakness in arms or legs:     Sudden numbness in arms or legs:     Sudden onset of difficulty speaking or slurred speech:    Temporary loss of vision in one eye:     Problems with dizziness:         Gastrointestinal    Blood in stool:     Vomited blood:         Genitourinary    Burning when urinating:     Blood in urine:        Psychiatric    Major depression:         Hematologic    Bleeding problems:    Problems with blood clotting too easily:        Skin    Rashes or ulcers:        Constitutional    Fever or chills:      PHYSICAL EXAMINATION:  Today's Vitals   04/22/24 1356  BP: (!) 144/76  Pulse: (!) 55  Temp: 98 F (36.7 C)  TempSrc: Temporal  SpO2: 94%  Weight: 176 lb 12.8 oz (80.2 kg)  PainSc: 6    Body mass index is 26.88 kg/m.   General:  WDWN in NAD; vital signs documented above Gait: Not observed HENT: WNL, normocephalic Pulmonary: normal non-labored breathing , without wheezing Cardiac: regular HR, with carotid bruit on the left Abdomen: soft, NT; aortic pulse is not palpable Skin: without rashes Vascular Exam/Pulses:  Right Left  Femoral 2+ (normal) 2+ (normal)  DP Brisk doppler flow  Brisk doppler flow  PT 2+ (normal) Brisk doppler flow   Extremities: without ischemic changes, without Gangrene , without cellulitis; without open wounds; small scab on left 3rd toe that is healing Musculoskeletal: no muscle wasting or atrophy  Neurologic: A&O X 3 Psychiatric:  The pt has Normal affect.   Non-Invasive Vascular Imaging:   ABI's/TBI's on 04/22/2024: Right:  0.9/0.6 -  Great toe pressure: 75 Left:  0.91/0.76 - Great toe pressure: 94  Arterial duplex on 04/22/2024: +----------+--------+-----+---------------+---------+--------+  LEFT     PSV cm/sRatioStenosis       Waveform Comments  +----------+--------+-----+---------------+---------+--------+  EIA Distal220                         triphasic          +----------+--------+-----+---------------+---------+--------+  CFA Prox  387          50-74% stenosistriphasic          +----------+--------+-----+---------------+---------+--------+  POP Mid   81                          biphasic           +----------+--------+-----+---------------+---------+--------+  Left Stent(s):  +---------------+--------+---------------+--------+--------+  SFA           PSV cm/sStenosis       WaveformComments  +---------------+--------+---------------+--------+--------+  Prox to Stent  314     50-99% stenosisbiphasic          +---------------+--------+---------------+--------+--------+  Proximal Stent 166                    biphasic          +---------------+--------+---------------+--------+--------+  Mid Stent      74                     biphasic          +---------------+--------+---------------+--------+--------+  Distal Stent   86                     biphasic          +---------------+--------+---------------+--------+--------+  Distal to Stent75                     biphasic          +---------------+--------+---------------+--------+--------+   Summary:  Left: 50-74% stenosis noted in the common femoral artery.  Widely patent stent with no visualized stenosis.   Previous ABI's/TBI's on 11/11/2023: Right:  0.99/0.50 - Great toe pressure: 67 Left:  0.64/0.26 - Great toe pressure:  35   Previous carotid duplex 11/11/2023: -1-39% bilateral ICA stenosis   ASSESSMENT/PLAN:: 63 y.o. male here for follow up for PAD with hx of aortogram with stent to the  left SFA via right CFA on 02/24/2024 by Dr. Vikki Graves.  He underwent thrombolysis of LLE for acute ischemia on 03/18/2024 with recheck and percutaneous thrombectomy of left CFA, angioplasty of proximal left SFA, distal AK popliteal artery and stenting on 03/19/2024 by Dr. Fulton Job.    PAD -pt has palpable left PT pulse and ABI improved since procedure.  He continues to have pain/numbness in the left foot that has improved with Lyrica . He does have a small scab on the left 3rd toe that is improving, which is encouraging.   His duplex today shows an increased velocity in the left CFA and SFA proximal to the stent.  The stent is widely patent.  Discussed with Dr. Vikki Graves and since he has a palpable left PT pulse with improved ABI, will have him return in short follow up in 3 months with repeat studies.   -continue Eliquis /statin/Plavix  -pt will f/u in 3 months with LLE arterial duplex and ABI.  Left carotid bruit -pt had carotid duplex in November 2024 prior to CABG that revealed 1-39% bilateral ICA stenosis.     Maryanna Smart, [facility] [phone]  Clinic MD:   Vikki Graves

## 2024-04-24 ENCOUNTER — Ambulatory Visit: Attending: Cardiology | Admitting: Cardiology

## 2024-04-25 ENCOUNTER — Other Ambulatory Visit: Payer: Self-pay | Admitting: Vascular Surgery

## 2024-04-27 ENCOUNTER — Encounter: Payer: Self-pay | Admitting: Cardiology

## 2024-04-27 ENCOUNTER — Other Ambulatory Visit: Payer: Self-pay

## 2024-04-27 DIAGNOSIS — I70222 Atherosclerosis of native arteries of extremities with rest pain, left leg: Secondary | ICD-10-CM

## 2024-04-28 ENCOUNTER — Other Ambulatory Visit: Payer: Self-pay | Admitting: Student

## 2024-04-28 DIAGNOSIS — E1140 Type 2 diabetes mellitus with diabetic neuropathy, unspecified: Secondary | ICD-10-CM

## 2024-04-28 MED ORDER — PREGABALIN 150 MG PO CAPS: 150.0000 mg | ORAL_CAPSULE | Freq: Two times a day (BID) | ORAL | 0 refills | Status: DC

## 2024-04-28 NOTE — Telephone Encounter (Signed)
 RN called the pt to advise him the pioglitazone  was written for 11 refills and asked that he follow-up with his pharmacy. Pt verbalized understanding. Pt still needs a refill of pregabalin .

## 2024-04-28 NOTE — Telephone Encounter (Unsigned)
 Copied from CRM [id number]. Topic: Clinical - Medication Refill >> Apr 28, 2024  8:43 AM Blair Bumpers wrote: Medication: pioglitazone  (ACTOS ) 30 MG tablet & pregabalin  (LYRICA ) 150 MG capsule  Has the patient contacted their pharmacy? No (Agent: If no, request that the patient contact the pharmacy for the refill. If patient does not wish to contact the pharmacy document the reason why and proceed with request.) (Agent: If yes, when and what did the pharmacy advise?)  This is the patient's preferred pharmacy:  [facility] [phone] - [place], [place] - [address] AT [facility] [address]  [place] [address] Phone: [phone] Fax: [phone]   Is this the correct pharmacy for this prescription? Yes If no, delete pharmacy and type the correct one.   Has the prescription been filled recently? Yes  Is the patient out of the medication? Yes  Has the patient been seen for an appointment in the last year OR does the patient have an upcoming appointment? Yes  Can we respond through MyChart? Yes  Agent: Please be advised that Rx refills may take up to 3 business days. We ask that you follow-up with your pharmacy.

## 2024-04-29 ENCOUNTER — Telehealth: Payer: Self-pay

## 2024-04-29 ENCOUNTER — Encounter: Payer: Self-pay | Admitting: Student

## 2024-04-29 ENCOUNTER — Ambulatory Visit (INDEPENDENT_AMBULATORY_CARE_PROVIDER_SITE_OTHER): Admitting: Student

## 2024-04-29 VITALS — BP 118/74 | HR 68 | Temp 97.5°F | Ht 68.0 in | Wt 174.9 lb

## 2024-04-29 DIAGNOSIS — K062 Gingival and edentulous alveolar ridge lesions associated with trauma: Secondary | ICD-10-CM | POA: Insufficient documentation

## 2024-04-29 DIAGNOSIS — Z7984 Long term (current) use of oral hypoglycemic drugs: Secondary | ICD-10-CM | POA: Diagnosis not present

## 2024-04-29 DIAGNOSIS — E1140 Type 2 diabetes mellitus with diabetic neuropathy, unspecified: Secondary | ICD-10-CM

## 2024-04-29 NOTE — Patient Instructions (Addendum)
 Donald Palmer,Thank you for allowing me to take part in your care today.  Here are your instructions.  1.  I have referred you for dentist and an ophthalmologist.  2.  Please come back in 1 month for A1c check    Thank you, Dr. Lydia Sams  If you have any other questions please contact the internal medicine clinic at [phone] If it is after hours, please call the [facility] at [phone] and then ask the person who picks up for the resident on call.

## 2024-04-29 NOTE — Telephone Encounter (Signed)
 Pt called told a nurse he wanted an appt today for c/o foot pain, that he was across the street at for an outpatient appt and is on the way now.

## 2024-04-29 NOTE — Telephone Encounter (Signed)
 Other: -pt arrived in lobby, approached registration desk demanding to see the nurse and Dr. Vikki Graves, that he has issues. -called pt on cell phone.  He states that his foot is hurting and he needs pain medicine, that he just left his other doctor and they won't do anything.  He states it is not his leg - it's his foot, that he had this twice before and never had this feeling stating he should have never had this surgery.  He confirms he was here last week, had the blood flow checked and saw the provider, then stating he told them his foot is still hurting which that has not changed.   -reviewed with pt it's great his leg feels better and unfortunate the pain in his foot not  but likely chronic and we do not treat chronic conditions.  He states his doctor gave him a medicine and it helped for a little while but not anymore.  Pt advised to talk to his regular doctor about options for managing chronic pain.

## 2024-04-29 NOTE — Progress Notes (Signed)
 CC: Eye doctor and dentist referral  HPI:  [name] is a 63 y.o. male with past medical history of hypertension, CAD, GERD, type 2 diabetes mellitus who presents to the clinic for referral for eyes and dental.  Please see assessment and plan for full HPI.  Medications: PAD: Lipitor  80 mg daily, Plavix  75 mg daily, Eliquis  5 mg twice daily Gout: Colchicine  0.6 mg as needed Hypertension: Metoprolol  tartrate 25 mg twice daily GERD: Omeprazole  40 mg daily Diabetes: Pioglitazone  30 mg daily ED: Cialis  10 mg as needed Neuropathy: Lyrica  150 mg twice daily   Past Medical History:  Diagnosis Date   Back pain, chronic    Coronary artery disease    Diabetes mellitus 2007   Hepatitis C    [id number]   History of chronic hepatitis C 05/13/2012   First noted 2013. RF inc tattoos obtained while incarcerated. [facility]  2016 SVR 2017    Hypertension goal BP (blood pressure) < 140/80    Microcytic anemia    Neuromuscular disorder (HCC)    PAD (peripheral artery disease) (HCC)    Pancreatitis 01/09/2012     Current Outpatient Medications:    Accu-Chek Softclix Lancets lancets, check blood sugar 2 times a day, Disp: 100 each, Rfl: 5   acetaminophen  (TYLENOL ) 500 MG tablet, Take 1,000 mg by mouth every 6 (six) hours as needed for mild pain (pain score 1-3) or headache., Disp: , Rfl:    atorvastatin  (LIPITOR ) 80 MG tablet, Take 1 tablet (80 mg total) by mouth daily at 6 PM., Disp: 90 tablet, Rfl: 3   Blood Glucose Monitoring Suppl (ACCU-CHEK GUIDE) w/Device KIT, Check blood sugar 2 times a day, Disp: 1 kit, Rfl: 1   clopidogrel  (PLAVIX ) 75 MG tablet, Take 1 tablet (75 mg total) by mouth daily., Disp: 90 tablet, Rfl: 3   colchicine  0.6 MG tablet, Take 1 tablet (0.6 mg total) by mouth daily as needed (gout flare)., Disp: 30 tablet, Rfl: 2   ELIQUIS  5 MG TABS tablet, TAKE 1 TABLET(5 MG) BY MOUTH TWICE DAILY, Disp: 60 tablet, Rfl: 1   Insulin  Pen Needle (PEN NEEDLES) 32G X 4 MM MISC, Use to  inject liraglutide  once a day, Disp: 100 each, Rfl: 3   metoprolol  tartrate (LOPRESSOR ) 25 MG tablet, Take 1 tablet (25 mg total) by mouth 2 (two) times daily., Disp: 60 tablet, Rfl: 2   omeprazole  (PRILOSEC) 40 MG capsule, Take 1 capsule (40 mg total) by mouth daily., Disp: 90 capsule, Rfl: 3   pioglitazone  (ACTOS ) 30 MG tablet, Take 1 tablet (30 mg total) by mouth daily., Disp: 30 tablet, Rfl: 11   pregabalin  (LYRICA ) 150 MG capsule, Take 1 capsule (150 mg total) by mouth 2 (two) times daily., Disp: 60 capsule, Rfl: 0   tadalafil  (CIALIS ) 10 MG tablet, Take 1 tablet (10 mg total) by mouth as needed for erectile dysfunction., Disp: 20 tablet, Rfl: 1  Review of Systems:    Eye: Patient endorses blurry vision Mouth: Patient endorses denture irritation  Physical Exam:  Vitals:   04/29/24 0908  BP: 118/74  Pulse: 68  Temp: (!) 97.5 F (36.4 C)  TempSrc: Oral  SpO2: 100%  Weight: 174 lb 14.4 oz (79.3 kg)  Height: 5\' 8"  (1.727 m)   General: Patient is sitting comfortably in the room  Eyes: Pupils equal and reactive to light, EOM intact, cataract noted to right eye Mouth: No evidence of erythema or infection to gumlines.  No ulceration or drainage or abscess appreciated.  Dentures noted to upper jaw. Neck: Supple, nontender, full range of motion, No JVD Cardio: Regular rate and rhythm, no murmurs, rubs or gallops Pulmonary: Clear to ausculation bilaterally with no rales, rhonchi, and crackles    Assessment & Plan:   Type 2 diabetes mellitus with diabetic neuropathy (HCC) Patient has a past medical history of type 1 diabetes mellitus.  His only medication at this time is pioglitazone  30 mg daily.  He has no concerns for his diabetes at this time.  He does request an ophthalmology referral today given he has not had a dilated retinal exam.  Will place the order today.  He has had no concerns at this time.  Plan: - Continue pioglitazone  30 mg daily - Return in 1 month for A1c check -  Referral placed for ophthalmology dilated retinal exam  Denture irritation Patient presents today to request dental referral for irritating dentures.  I did examine the mouth and I do not see any abscess, ulceration, or erythema concerning for infection.  However we do not do dental referrals here.  Instructed patient to call his insurance to see which dentist he can go to.  Plan: - Patient instructed to call insurance for dental recommendations  Patient discussed with Dr. Sonia Durand, DO PGY-2 Internal Medicine Resident

## 2024-04-29 NOTE — Assessment & Plan Note (Signed)
 Patient presents today to request dental referral for irritating dentures.  I did examine the mouth and I do not see any abscess, ulceration, or erythema concerning for infection.  However we do not do dental referrals here.  Instructed patient to call his insurance to see which dentist he can go to.  Plan: - Patient instructed to call insurance for dental recommendations

## 2024-04-29 NOTE — Progress Notes (Signed)
 Internal Medicine Clinic Attending  Case discussed with the resident at the time of the visit.  We reviewed the resident's history and exam and pertinent patient test results.  I agree with the assessment, diagnosis, and plan of care documented in the resident's note.

## 2024-04-29 NOTE — Assessment & Plan Note (Signed)
 Patient has a past medical history of type 1 diabetes mellitus.  His only medication at this time is pioglitazone  30 mg daily.  He has no concerns for his diabetes at this time.  He does request an ophthalmology referral today given he has not had a dilated retinal exam.  Will place the order today.  He has had no concerns at this time.  Plan: - Continue pioglitazone  30 mg daily - Return in 1 month for A1c check - Referral placed for ophthalmology dilated retinal exam

## 2024-05-28 LAB — HM DIABETES EYE EXAM

## 2024-06-04 ENCOUNTER — Encounter: Admitting: Student

## 2024-06-04 ENCOUNTER — Telehealth: Payer: Self-pay

## 2024-06-04 NOTE — Telephone Encounter (Signed)
 I called pt to resch his appt that he had missed today, no answer. I left message for patient to call the clinic back.

## 2024-06-04 NOTE — Progress Notes (Deleted)
 CC: ***  HPI:  [name] is a 63 y.o.  male with past medical history of hypertension, CAD, GERD, type 2 diabetes mellitus who presents to the clinic for referral for eyes and dental.  Please see assessment and plan for full HPI.   Medications: PAD: Lipitor  80 mg daily, Plavix  75 mg daily, Eliquis  5 mg twice daily Gout: Colchicine  0.6 mg as needed Hypertension: Metoprolol  tartrate 25 mg twice daily GERD: Omeprazole  40 mg daily Diabetes: Pioglitazone  30 mg daily ED: Cialis  10 mg as needed Neuropathy: Lyrica  150 mg twice daily  A1c 7.9 on 03/04/2024    Past Medical History:  Diagnosis Date   Back pain, chronic    Coronary artery disease    Diabetes mellitus 2007   Hepatitis C    [id number]   History of chronic hepatitis C 05/13/2012   First noted 2013. RF inc tattoos obtained while incarcerated. [facility]  2016 SVR 2017    Hypertension goal BP (blood pressure) < 140/80    Microcytic anemia    Neuromuscular disorder (HCC)    PAD (peripheral artery disease) (HCC)    Pancreatitis 01/09/2012     Current Outpatient Medications:    Accu-Chek Softclix Lancets lancets, check blood sugar 2 times a day, Disp: 100 each, Rfl: 5   acetaminophen  (TYLENOL ) 500 MG tablet, Take 1,000 mg by mouth every 6 (six) hours as needed for mild pain (pain score 1-3) or headache., Disp: , Rfl:    atorvastatin  (LIPITOR ) 80 MG tablet, Take 1 tablet (80 mg total) by mouth daily at 6 PM., Disp: 90 tablet, Rfl: 3   Blood Glucose Monitoring Suppl (ACCU-CHEK GUIDE) w/Device KIT, Check blood sugar 2 times a day, Disp: 1 kit, Rfl: 1   clopidogrel  (PLAVIX ) 75 MG tablet, Take 1 tablet (75 mg total) by mouth daily., Disp: 90 tablet, Rfl: 3   colchicine  0.6 MG tablet, Take 1 tablet (0.6 mg total) by mouth daily as needed (gout flare)., Disp: 30 tablet, Rfl: 2   ELIQUIS  5 MG TABS tablet, TAKE 1 TABLET(5 MG) BY MOUTH TWICE DAILY, Disp: 60 tablet, Rfl: 1   Insulin  Pen Needle (PEN NEEDLES) 32G X 4 MM MISC, Use to  inject liraglutide  once a day, Disp: 100 each, Rfl: 3   metoprolol  tartrate (LOPRESSOR ) 25 MG tablet, Take 1 tablet (25 mg total) by mouth 2 (two) times daily., Disp: 60 tablet, Rfl: 2   omeprazole  (PRILOSEC) 40 MG capsule, Take 1 capsule (40 mg total) by mouth daily., Disp: 90 capsule, Rfl: 3   pioglitazone  (ACTOS ) 30 MG tablet, Take 1 tablet (30 mg total) by mouth daily., Disp: 30 tablet, Rfl: 11   pregabalin  (LYRICA ) 150 MG capsule, Take 1 capsule (150 mg total) by mouth 2 (two) times daily., Disp: 60 capsule, Rfl: 0   tadalafil  (CIALIS ) 10 MG tablet, Take 1 tablet (10 mg total) by mouth as needed for erectile dysfunction., Disp: 20 tablet, Rfl: 1  Review of Systems:  ***  Constitutional: Eye: Respiratory: Cardiovascular: GI: MSK: GU: Skin: Neuro: Endocrine:   Physical Exam:  There were no vitals filed for this visit. *** General: Patient is sitting comfortably in the room  Eyes: Pupils equal and reactive to light, EOM intact  Head: Normocephalic, atraumatic  Neck: Supple, nontender, full range of motion, No JVD Cardio: Regular rate and rhythm, no murmurs, rubs or gallops. 2+ pulses to bilateral upper and lower extremities  Chest: No chest tenderness Pulmonary: Clear to ausculation bilaterally with no rales, rhonchi, and crackles  Abdomen: Soft, nontender with normoactive bowel sounds with no rebound or guarding  Neuro: Alert and orientated x3. CN II-XII intact. Sensation intact to upper and lower extremities. 2+ patellar reflex.  Back: No midline tenderness, no step off or deformities noted. No paraspinal muscle tenderness.  Skin: No rashes noted  MSK: 5/5 strength to upper and lower extremities.    Assessment & Plan:   No problem-specific Assessment & Plan notes found for this encounter.    Patient {GC/GE:[id number]::discussed with,seen with} Dr. {WNUUV:[id number]::IHKVQQVZ,DGLOVFI,EPPIRJ,JOACZYSA,YTKZSWFU,XNATFTD}  Jonelle Neri, DO PGY-2 Internal Medicine  Resident

## 2024-06-11 ENCOUNTER — Ambulatory Visit: Payer: Self-pay

## 2024-06-11 NOTE — Telephone Encounter (Signed)
 FYI Only or Action Required?: FYI only for provider.  Patient was last seen in primary care on 04/29/2024 by Tobie Gaines, DO. Called Nurse Triage reporting Shoulder Pain. Symptoms began several years ago. Interventions attempted: Nothing. Symptoms are: left shoulder pain, left leg pain gradually worsening.  Triage Disposition: See PCP Within 2 Weeks (overriding See PCP When Office is Open (Within 3 Days))  Patient/caregiver understands and will follow disposition?: Yes                          Copied from CRM [id number]. Topic: Clinical - Red Word Triage >> Jun 11, 2024  9:29 AM Graeme ORN wrote: Red Word that prompted transfer to Nurse Triage: Shoulder pain really bad - requesting injection Reason for Disposition  [1] MODERATE pain (e.g., interferes with normal activities) AND [2] present > 3 days  Answer Assessment - Initial Assessment Questions Patient states he was getting shoulder injections back in [place] in the last few years. Called CAL and confirmed patient can not be scheduled with Dr Rosan on Monday. Next available appt patient can go to is July 3rd at 3:45pm with Dr Amoako. Advised patient to call the day before to confirm the appointment per CAL request.   1. ONSET: When did the pain start?     X2 years after an injury, gradually worsening x 7 months.  2. LOCATION: Where is the pain located?     Left.  3. PAIN: How bad is the pain? (Scale 1-10; or mild, moderate, severe)   - MILD (1-3): doesn't interfere with normal activities   - MODERATE (4-7): interferes with normal activities (e.g., work or school) or awakens from sleep   - SEVERE (8-10): excruciating pain, unable to do any normal activities, unable to move arm at all due to pain     8-9/10. He states he is not treating the pain with any medications or home treatment.  4. WORK OR EXERCISE: Has there been any recent work or exercise that involved this part of the body?     No.  5.  CAUSE: What do you think is causing the shoulder pain?     Injury 2 years ago.  6. OTHER SYMPTOMS: Do you have any other symptoms? (e.g., neck pain, swelling, rash, fever, numbness, weakness)     Left leg pain (he states he had surgery in April and it messed the leg up worse).  7. PREGNANCY: Is there any chance you are pregnant? When was your last menstrual period?     N/A.  Protocols used: Shoulder Pain-A-AH

## 2024-06-15 ENCOUNTER — Ambulatory Visit: Payer: Self-pay | Admitting: Internal Medicine

## 2024-06-15 ENCOUNTER — Other Ambulatory Visit: Payer: Self-pay | Admitting: Student

## 2024-06-18 ENCOUNTER — Ambulatory Visit: Payer: Self-pay | Admitting: Student

## 2024-06-18 VITALS — BP 109/65 | HR 105 | Temp 98.6°F | Ht 68.0 in | Wt 177.3 lb

## 2024-06-18 DIAGNOSIS — M25511 Pain in right shoulder: Secondary | ICD-10-CM | POA: Insufficient documentation

## 2024-06-18 NOTE — Progress Notes (Signed)
 CC: Right shoulder pain  HPI:  [name] is a 63 y.o. male living with a history stated below and presents today for right shoulder pain. Please see problem based assessment and plan for additional details.  Past Medical History:  Diagnosis Date   Back pain, chronic    Coronary artery disease    Diabetes mellitus 2007   Hepatitis C    [id number]   History of chronic hepatitis C 05/13/2012   First noted 2013. RF inc tattoos obtained while incarcerated. [facility]  2016 SVR 2017    Hypertension goal BP (blood pressure) < 140/80    Microcytic anemia    Neuromuscular disorder (HCC)    PAD (peripheral artery disease) (HCC)    Pancreatitis 01/09/2012    Current Outpatient Medications on File Prior to Visit  Medication Sig Dispense Refill   Accu-Chek Softclix Lancets lancets check blood sugar 2 times a day 100 each 5   acetaminophen  (TYLENOL ) 500 MG tablet Take 1,000 mg by mouth every 6 (six) hours as needed for mild pain (pain score 1-3) or headache.     atorvastatin  (LIPITOR ) 80 MG tablet Take 1 tablet (80 mg total) by mouth daily at 6 PM. 90 tablet 3   Blood Glucose Monitoring Suppl (ACCU-CHEK GUIDE) w/Device KIT Check blood sugar 2 times a day 1 kit 1   clopidogrel  (PLAVIX ) 75 MG tablet Take 1 tablet (75 mg total) by mouth daily. 90 tablet 3   colchicine  0.6 MG tablet Take 1 tablet (0.6 mg total) by mouth daily as needed (gout flare). 30 tablet 2   ELIQUIS  5 MG TABS tablet TAKE 1 TABLET(5 MG) BY MOUTH TWICE DAILY 60 tablet 1   Insulin  Pen Needle (PEN NEEDLES) 32G X 4 MM MISC Use to inject liraglutide  once a day 100 each 3   metoprolol  tartrate (LOPRESSOR ) 25 MG tablet Take 1 tablet (25 mg total) by mouth 2 (two) times daily. 60 tablet 2   omeprazole  (PRILOSEC) 40 MG capsule Take 1 capsule (40 mg total) by mouth daily. 90 capsule 3   pioglitazone  (ACTOS ) 30 MG tablet Take 1 tablet (30 mg total) by mouth daily. 30 tablet 11   pregabalin  (LYRICA ) 150 MG capsule TAKE 1 CAPSULE(150  MG) BY MOUTH TWICE DAILY 60 capsule 1   tadalafil  (CIALIS ) 10 MG tablet Take 1 tablet (10 mg total) by mouth as needed for erectile dysfunction. 20 tablet 1   No current facility-administered medications on file prior to visit.    Family History  Problem Relation Age of Onset   Diabetes Mother    Hypertension Mother    Hyperlipidemia Mother    Asthma Sister    Obesity Sister    Diabetes Brother    Diabetes Brother    Diabetes Daughter    Heart attack Neg Hx    Sudden death Neg Hx     Social History   Socioeconomic History   Marital status: Single    Spouse name: Not on file   Number of children: 4   Years of education: Not on file   Highest education level: 12th grade  Occupational History   Not on file  Tobacco Use   Smoking status: Some Days    Current packs/day: 0.10    Average packs/day: 0.1 packs/day for 30.0 years (3.0 ttl pk-yrs)    Types: Cigarettes, Cigars   Smokeless tobacco: Never   Tobacco comments:    Per pt, 3 cigarettes per day, as of 11/11/23  Vaping Use  Vaping status: Never Used  Substance and Sexual Activity   Alcohol  use: Not Currently    Alcohol /week: 5.0 standard drinks of alcohol     Types: 5 Cans of beer per week    Comment: Beer.   Drug use: Not Currently    Frequency: 1.0 times per week    Types: Marijuana    Comment: rarely   Sexual activity: Yes  Other Topics Concern   Not on file  Social History Narrative   Current Social History 08/15/2021        Patient lives with his daughter in a home which is 1 story. There are 8 steps up to the entrance the patient uses with a railing      Patient's method of transportation is personal car.      The highest level of education was some high school.      The patient currently disabled.      Identified important Relationships are my daughters and grandkids       Pets : 0       Interests / Fun: Web designer       Current Stressors: money       Religious / Personal  Beliefs: no       Social Drivers of Corporate investment banker Strain: Medium Risk (01/06/2024)   Overall Financial Resource Strain (CARDIA)    Difficulty of Paying Living Expenses: Somewhat hard  Food Insecurity: No Food Insecurity (03/18/2024)   Hunger Vital Sign    Worried About Running Out of Food in the Last Year: Never true    Ran Out of Food in the Last Year: Never true  Recent Concern: Food Insecurity - Food Insecurity Present (01/06/2024)   Hunger Vital Sign    Worried About Running Out of Food in the Last Year: Sometimes true    Ran Out of Food in the Last Year: Never true  Transportation Needs: No Transportation Needs (03/18/2024)   PRAPARE - Administrator, Civil Service (Medical): No    Lack of Transportation (Non-Medical): No  Physical Activity: Not on file  Stress: Not on file  Social Connections: Not on file  Intimate Partner Violence: Not At Risk (03/18/2024)   Humiliation, Afraid, Rape, and Kick questionnaire    Fear of Current or Ex-Partner: No    Emotionally Abused: No    Physically Abused: No    Sexually Abused: No    Review of Systems: ROS negative except for what is noted on the assessment and plan.  Vitals:   06/18/24 1549  BP: 109/65  Pulse: (!) 105  Temp: 98.6 F (37 C)  TempSrc: Oral  SpO2: 97%  Weight: 177 lb 4.8 oz (80.4 kg)  Height: 5' 8 (1.727 m)    Physical Exam: Constitutional: well-appearing man, sitting in chair , in no acute distress,constantly dozing off  Cardiovascular: regular rate and rhythm, no m/r/g Pulmonary/Chest: normal work of breathing on room air Abdominal: soft, non-tender, non-distended MSK: Right shoulder is without any swelling or surrounding erythema and had no signs of trauma.  Passive and active range of motion intact.  No loss of sensation Skin: warm and dry Psych: normal mood and behavior  Assessment & Plan:   Right shoulder pain [name] is a 63 year old male who presented to the office with  concerns of right shoulder pain, which he reports has been progressively worsening over the past two weeks. He attributes the onset of the pain to germs being injected  into [his] legs, though further clarification on this statement was unclear. He appears to believe that something may have been injected during a prior leg procedure, contributing to his current symptoms.The patient persistently requested a steroid injection in his right shoulder.   On examination, there was no swelling or erythema, and he demonstrated full range of motion without any limitations. When reassured about the findings, the patient became agitated and threatened to leave the office. I offered a referral to sports medicine for further evaluation of his right shoulder pain, which he accepted.  I offered to address his other chronic conditions during this visit, but he chose to defer those discussions to his next appointment. Notably, he repeatedly dozed off throughout the encounter, raising concern about whether this was due to fatigue or possible substance use. If he continues to exhibit similar behavior at his next visit, it may be appropriate to explore the possibility of sleep apnea or other underlying causes. - Ambulatory referral to sports medicine    Patient discussed with Dr. Narendra   Tyquan Carmickle, M.D [facility] Phone: [phone] Date 06/18/2024 Time 5:22 PM

## 2024-06-18 NOTE — Patient Instructions (Addendum)
 Thank you, [name] for allowing us  to provide your care today. Today we discussed your shoulder pain. I am referring you to sport medicine for further evaluation.  If your shoulder pain get worse and unbearable please go to the nearest emergency.  I have ordered the following labs for you:  Lab Orders  No laboratory test(s) ordered today     Tests ordered today:    Referrals ordered today:   Referral Orders         Ambulatory referral to Sports Medicine       I have ordered the following medication/changed the following medications:   Stop the following medications: There are no discontinued medications.   Start the following medications: No orders of the defined types were placed in this encounter.    Follow up: 2 months or as needed    Remember:   Should you have any questions or concerns please call the internal medicine clinic at [phone].   Drue Lisa Grow MD 06/18/2024, 5:21 PM   [facility]

## 2024-06-18 NOTE — Assessment & Plan Note (Signed)
 [name] is a 63 year old male who presented to the office with concerns of right shoulder pain, which he reports has been progressively worsening over the past two weeks. He attributes the onset of the pain to germs being injected into [his] legs, though further clarification on this statement was unclear. He appears to believe that something may have been injected during a prior leg procedure, contributing to his current symptoms.The patient persistently requested a steroid injection in his right shoulder.   On examination, there was no swelling or erythema, and he demonstrated full range of motion without any limitations. When reassured about the findings, the patient became agitated and threatened to leave the office. I offered a referral to sports medicine for further evaluation of his right shoulder pain, which he accepted.  I offered to address his other chronic conditions during this visit, but he chose to defer those discussions to his next appointment. Notably, he repeatedly dozed off throughout the encounter, raising concern about whether this was due to fatigue or possible substance use. If he continues to exhibit similar behavior at his next visit, it may be appropriate to explore the possibility of sleep apnea or other underlying causes. - Ambulatory referral to sports medicine

## 2024-06-19 NOTE — Addendum Note (Signed)
 Addended by: Chris Cripps on: 06/19/2024 10:34 AM   Modules accepted: Level of Service

## 2024-06-19 NOTE — Progress Notes (Signed)
 Internal Medicine Clinic Attending  Case discussed with the resident at the time of the visit.  We reviewed the resident's history and exam and pertinent patient test results.  I agree with the assessment, diagnosis, and plan of care documented in the resident's note.

## 2024-06-25 ENCOUNTER — Encounter: Payer: Self-pay | Admitting: Internal Medicine

## 2024-07-03 ENCOUNTER — Other Ambulatory Visit: Payer: Self-pay

## 2024-07-07 ENCOUNTER — Telehealth: Payer: Self-pay | Admitting: Cardiology

## 2024-07-07 ENCOUNTER — Other Ambulatory Visit: Payer: Self-pay

## 2024-07-07 DIAGNOSIS — I739 Peripheral vascular disease, unspecified: Secondary | ICD-10-CM

## 2024-07-07 NOTE — Telephone Encounter (Signed)
 I am not sure what the usual protocol for this is.  Pain control may not be entirely in the hands of Dr. Sheree either.  I am fine with what ever patient would prefer, is received on the vascular surgeon or seeing interventional vascular specialist.  Thanks MJP

## 2024-07-07 NOTE — Telephone Encounter (Signed)
 Spoke with pt regarding his vascular doctor. Pt explained that he is not happy with Dr. Sheree who did his peripheral vascular thrombectomy. The pt stated he is in a considerable amount of pain and has to take multiple pain medications to relieve it. Pt denied any redness or swelling of his legs. The pain is bilateral. Pt is scheduled to see Dr. Sheree on 8/6 but would like a referral to a new doctor. Pt was told his request would be sent to Dr. Elmira and his nurse for their suggestions. Pt verbalized understanding. All questions if any were answered.

## 2024-07-07 NOTE — Telephone Encounter (Signed)
 Copied from CRM [id number]. Topic: Referral - Request for Referral >> Jul 07, 2024  9:25 AM Diannia H wrote: Did the patient discuss referral with their provider in the last year? Yes (If No - schedule appointment) (If Yes - send message)  Appointment offered? No  Type of order/referral and detailed reason for visit: Foot   Preference of office, provider, location: Dr. Wadie [facility] [address] [place], [place] [address]  If referral order, have you been seen by this specialty before? Yes (If Yes, this issue or another issue? When? Where?  Can we respond through MyChart? Yes

## 2024-07-07 NOTE — Telephone Encounter (Signed)
 Pt asking to be referred to another vascular doctor. Please advise

## 2024-07-08 ENCOUNTER — Ambulatory Visit: Payer: Self-pay

## 2024-07-08 ENCOUNTER — Ambulatory Visit: Admitting: Student

## 2024-07-08 VITALS — BP 133/81 | HR 100 | Temp 98.2°F | Ht 68.0 in | Wt 177.4 lb

## 2024-07-08 DIAGNOSIS — I739 Peripheral vascular disease, unspecified: Secondary | ICD-10-CM

## 2024-07-08 DIAGNOSIS — E1140 Type 2 diabetes mellitus with diabetic neuropathy, unspecified: Secondary | ICD-10-CM | POA: Diagnosis not present

## 2024-07-08 DIAGNOSIS — E1142 Type 2 diabetes mellitus with diabetic polyneuropathy: Secondary | ICD-10-CM | POA: Diagnosis not present

## 2024-07-08 DIAGNOSIS — Z7985 Long-term (current) use of injectable non-insulin antidiabetic drugs: Secondary | ICD-10-CM | POA: Diagnosis not present

## 2024-07-08 LAB — POCT GLYCOSYLATED HEMOGLOBIN (HGB A1C): Hemoglobin A1C: 8.3 % — AB (ref 4.0–5.6)

## 2024-07-08 LAB — GLUCOSE, CAPILLARY: Glucose-Capillary: 239 mg/dL — ABNORMAL HIGH (ref 70–99)

## 2024-07-08 MED ORDER — DULOXETINE HCL 30 MG PO CPEP: 30.0000 mg | ORAL_CAPSULE | Freq: Every day | ORAL | 2 refills | Status: DC

## 2024-07-08 MED ORDER — SEMAGLUTIDE(0.25 OR 0.5MG/DOS) 2 MG/3ML ~~LOC~~ SOPN: 0.2500 mg | PEN_INJECTOR | SUBCUTANEOUS | 2 refills | Status: DC

## 2024-07-08 MED ORDER — PREGABALIN 300 MG PO CAPS: 300.0000 mg | ORAL_CAPSULE | Freq: Two times a day (BID) | ORAL | 1 refills | Status: DC

## 2024-07-08 NOTE — Progress Notes (Unsigned)
 Acute Office Visit  Subjective:     [name], male    DOB: October 29, 1961, 63 y.o.   MRN: [id number]  Chief Complaint  Patient presents with   Leg Pain    Left  Sharp shooting pains at night    Hyperglycemia    Elevated glucoses     HPI This is a 63 year old male with past medical history of hypertension, CAD, GERD, type 2 diabetes mellitus who presents to the clinic for leg pain.   ROS As per assessment plan Objective:    BP 133/81 (BP Location: Right Arm, Patient Position: Sitting, Cuff Size: Normal)   Pulse 100   Temp 98.2 F (36.8 C) (Oral)   Ht 5' 8 (1.727 m)   Wt 177 lb 6.4 oz (80.5 kg)   SpO2 100%   BMI 26.97 kg/m  BP Readings from Last 3 Encounters:  07/08/24 133/81  06/18/24 109/65  04/29/24 118/74   Wt Readings from Last 3 Encounters:  07/08/24 177 lb 6.4 oz (80.5 kg)  06/18/24 177 lb 4.8 oz (80.4 kg)  04/29/24 174 lb 14.4 oz (79.3 kg)     Physical Exam General: Sitting in chair, no acute distress Cardiovascular: Regular rate Pulmonary: Breathing comfortably Abdomen: Soft, nontender, nondistended MSK: No lower extremity edema bilaterally, range of motion intact.  Results for orders placed or performed in visit on 07/08/24  Glucose, capillary  Result Value Ref Range   Glucose-Capillary 239 (H) 70 - 99 mg/dL  POC Hbg J8R  Result Value Ref Range   Hemoglobin A1C 8.3 (A) 4.0 - 5.6 %   HbA1c POC (<> result, manual entry)     HbA1c, POC (prediabetic range)     HbA1c, POC (controlled diabetic range)          Assessment & Plan:  Patient is discussed with Karna  Problem List Items Addressed This Visit       Cardiovascular and Mediastinum   PAD (peripheral artery disease) (HCC) (Chronic)   PMHx of PAD, undergone SFA popliteal artery stenting on 02/24/2024; Cath Lab on 03/18/2024 where he underwent LLE angiogram with initiation of lysis therapy. Cath Lab on 03/19/2024 for repeat left lower extremity angiogram, left common femoral artery  percutaneous thrombectomy, and balloon angioplasty of the proximal left SFA stent and distal left above-knee popliteal artery stent.  He follows with vascular surgery, last OV 04/2024 for similar symptoms, though in office duplex showed increased velocity in the left CFA and SFA proximal to the stent; noting improved ABI. Today, he presents with similar symptoms of bilateral LE neuropathic pain R>L, describes it as burning shooting pain. Reports that he is currently taking Eliquis  and Plavix .  Reports that he is not taking statin because it does not help with his pain. Per chart review, patient has vascular ultrasound ABI with/without TBI and vascular ultrasound of lower extremity arterial duplex that scheduled on 07/22/2024 by [facility]. -Patient is advised to follow-up on vascular ultrasound and follow-up with [facility] - Strongly counseled on continue taking Eliquis /statin/Plavix         Endocrine   Type 2 diabetes mellitus with diabetic neuropathy (HCC) (Chronic)   Last A1c 7.9 on 03/04/2024; A1c is 8.3 today.  Patient has a longstanding history of noncompliance with his diabetes medications.  At first patient reports that he is taking pioglitazone  however he still has the bottle from 09/2023 with plenty of tablets in it.  Then patient reports that he is using insulin  degludec on days where his blood  glucose is above 250, reports that he still has 1 pen left.  Then patient reports that he does not take his medications daily, he takes it on certain days.  Patient was strongly counseled that his underlying worsening of his symptoms could be related to diabetic neuropathy due to uncontrolled diabetes.  Patient is advised/counseled to take his medications daily.  Patient reports that he cannot tolerate metformin  due to GI side effects.  But is willing to start another medication.  CBG data is reviewed, 77% is at goal. -Patient is advised to stop taking insulin  and pioglitazone  - Start Ozempic  0.25 mg, once weekly; return  to clinic in 1 month, then we will increase it to Ozempic  0.50 mg weekly.       Relevant Medications   Semaglutide ,0.25 or 0.5MG /DOS, 2 MG/3ML SOPN   Other Relevant Orders   POC Hbg A1C (Completed)   Glucose, capillary (Completed)   Diabetic peripheral neuropathy associated with type 2 diabetes mellitus (HCC) - Primary   Patient presents today reporting chronic pain in his bilateral lower extremities mostly right lower extremity.  Patient reports that he has burning sensation that is worse at night and in the morning.  On exam, no lower extremity edema is noted, DP pulses is intact, range of motion is intact.  Patient reports that he is taking Lyrica  150 mg, reports that he is taking 2 tablets in the morning and 2 tablets in the evening, total of 600 mg daily.  Patient was advised against taking 600 mg daily, due to adverse effects, not recommended for diabetic neuropathy.  Patient was counseled on medication adherence.  - Patient is counseled on to only take Lyrica  150 mg twice daily - We will start Cymbalta  30 mg daily, will reassess his symptoms in 2 to 3 months.      Relevant Medications   Semaglutide ,0.25 or 0.5MG /DOS, 2 MG/3ML SOPN   DULoxetine  (CYMBALTA ) 30 MG capsule    Meds ordered this encounter  Medications   Semaglutide ,0.25 or 0.5MG /DOS, 2 MG/3ML SOPN    Sig: Inject 0.25 mg into the skin once a week.    Dispense:  3 mL    Refill:  2   DISCONTD: pregabalin  (LYRICA ) 300 MG capsule    Sig: Take 1 capsule (300 mg total) by mouth 2 (two) times daily.    Dispense:  60 capsule    Refill:  1   DULoxetine  (CYMBALTA ) 30 MG capsule    Sig: Take 1 capsule (30 mg total) by mouth daily.    Dispense:  30 capsule    Refill:  2    Return in about 1 month (around 08/08/2024) for DM.  Toma Edwards, DO

## 2024-07-08 NOTE — Telephone Encounter (Signed)
 Left message to call back. Pt needs to be advised that referral to see Dr. Court has been placed and his nurse is aware about referral.

## 2024-07-08 NOTE — Telephone Encounter (Signed)
 Pt is returning call.

## 2024-07-08 NOTE — Telephone Encounter (Signed)
 FYI Only or Action Required?: FYI only for provider.  Patient was last seen in primary care on 06/18/2024 by Renne Homans, MD.  Called Nurse Triage reporting Leg Pain.  Symptoms began several months ago.  Interventions attempted: Rest, hydration, or home remedies.  Symptoms are: unchanged.  Triage Disposition: See HCP Within 4 Hours (Or PCP Triage)  Patient/caregiver understands and will follow disposition?: Yes  Copied from CRM #[id number]. Topic: Clinical - Red Word Triage >> Jul 08, 2024 12:18 PM Mercer PEDLAR wrote: Red Word that prompted transfer to Nurse Triage: Severe leg pain. Reason for Disposition  [1] SEVERE pain (e.g., excruciating, unable to do any normal activities) AND [2] not improved after 2 hours of pain medicine  Answer Assessment - Initial Assessment Questions 1. ONSET: When did the pain start?      Chronic pain that has been going on since April 2025 2. LOCATION: Where is the pain located?      Left leg 3. PAIN: How bad is the pain?    (Scale 1-10; or mild, moderate, severe)     9 out of 10 4. WORK OR EXERCISE: Has there been any recent work or exercise that involved this part of the body?      no 5. CAUSE: What do you think is causing the leg pain?     Patient states pain has been going on since his surgery in April 6. OTHER SYMPTOMS: Do you have any other symptoms? (e.g., chest pain, back pain, breathing difficulty, swelling, rash, fever, numbness, weakness)     No  Protocols used: Leg Pain-A-AH

## 2024-07-08 NOTE — Patient Instructions (Addendum)
 Thank you, [name] for allowing us  to provide your care today. Today we discussed:  Take LYRICA  150 mg, ONE TABLET TWO TIMES A DAY. Do not take 2 tablets twice a day.   START CYMBALTA  30 mg, one tablet daily   START OZEMPIC , inject 0.25 mg ONCE A WEEK.   I have ordered the following labs for you:  Lab Orders         Glucose, capillary         POC Hbg A1C      Tests ordered today:  As above   Referrals ordered today:   Referral Orders  No referral(s) requested today     I have ordered the following medication/changed the following medications:   Stop the following medications: Medications Discontinued During This Encounter  Medication Reason   pregabalin  (LYRICA ) 150 MG capsule    pregabalin  (LYRICA ) 300 MG capsule      Start the following medications: Meds ordered this encounter  Medications   Semaglutide ,0.25 or 0.5MG /DOS, 2 MG/3ML SOPN    Sig: Inject 0.25 mg into the skin once a week.    Dispense:  3 mL    Refill:  2   DISCONTD: pregabalin  (LYRICA ) 300 MG capsule    Sig: Take 1 capsule (300 mg total) by mouth 2 (two) times daily.    Dispense:  60 capsule    Refill:  1   DULoxetine  (CYMBALTA ) 30 MG capsule    Sig: Take 1 capsule (30 mg total) by mouth daily.    Dispense:  30 capsule    Refill:  2     Follow up: 1 month for DM     Remember:   Should you have any questions or concerns please call the internal medicine clinic at [phone].     Rayann Atway, D.O. [facility]

## 2024-07-08 NOTE — Addendum Note (Signed)
 Addended by: MANDA BOTTCHER B on: 07/08/2024 06:07 PM   Modules accepted: Orders

## 2024-07-08 NOTE — Telephone Encounter (Signed)
 Left message to call back

## 2024-07-08 NOTE — Telephone Encounter (Signed)
 This patient is currently sch for an appt this afternoon @ 3:45 pm.  Copied from CRM #[id number]. Topic: Appointments - Scheduling Inquiry for Clinic >> Jul 08, 2024 12:15 PM Adrianna P wrote: Reason for CRM: Patient states he liked Dr. Toma and would like an appt with her, but received a call while I was trying to schedule him   ----------------------------------------------------------------------- From previous Reason for Contact - Scheduling: Patient/patient representative is calling to schedule an appointment. Refer to attachments for appointment information.

## 2024-07-08 NOTE — Telephone Encounter (Signed)
 This patient is currently sch for an appt this afternoon @ 3:45 pm and can discuss his Referral with the provider.   Copied from CRM #[id number]. Topic: Referral - Question >> Jul 08, 2024 12:06 PM Adrianna P wrote: Reason for CRM: Please call patient regarding referral, he does not want Dr. Sheree >> Jul 08, 2024 12:09 PM Adrianna P wrote: Please call [phone]

## 2024-07-09 ENCOUNTER — Other Ambulatory Visit: Payer: Self-pay | Admitting: *Deleted

## 2024-07-09 DIAGNOSIS — I739 Peripheral vascular disease, unspecified: Secondary | ICD-10-CM

## 2024-07-09 NOTE — Assessment & Plan Note (Signed)
 PMHx of PAD, undergone SFA popliteal artery stenting on 02/24/2024; Cath Lab on 03/18/2024 where he underwent LLE angiogram with initiation of lysis therapy. Cath Lab on 03/19/2024 for repeat left lower extremity angiogram, left common femoral artery percutaneous thrombectomy, and balloon angioplasty of the proximal left SFA stent and distal left above-knee popliteal artery stent.  He follows with vascular surgery, last OV 04/2024 for similar symptoms, though in office duplex showed increased velocity in the left CFA and SFA proximal to the stent; noting improved ABI. Today, he presents with similar symptoms of bilateral LE neuropathic pain R>L, describes it as burning shooting pain. Reports that he is currently taking Eliquis  and Plavix .  Reports that he is not taking statin because it does not help with his pain. Per chart review, patient has vascular ultrasound ABI with/without TBI and vascular ultrasound of lower extremity arterial duplex that scheduled on 07/22/2024 by [facility]. -Patient is advised to follow-up on vascular ultrasound and follow-up with [facility] - Strongly counseled on continue taking Eliquis /statin/Plavix 

## 2024-07-09 NOTE — Assessment & Plan Note (Signed)
 Last A1c 7.9 on 03/04/2024; A1c is 8.3 today.  Patient has a longstanding history of noncompliance with his diabetes medications.  At first patient reports that he is taking pioglitazone  however he still has the bottle from 09/2023 with plenty of tablets in it.  Then patient reports that he is using insulin  degludec on days where his blood glucose is above 250, reports that he still has 1 pen left.  Then patient reports that he does not take his medications daily, he takes it on certain days.  Patient was strongly counseled that his underlying worsening of his symptoms could be related to diabetic neuropathy due to uncontrolled diabetes.  Patient is advised/counseled to take his medications daily.  Patient reports that he cannot tolerate metformin  due to GI side effects.  But is willing to start another medication.  CBG data is reviewed, 77% is at goal. -Patient is advised to stop taking insulin  and pioglitazone  - Start Ozempic  0.25 mg, once weekly; return to clinic in 1 month, then we will increase it to Ozempic  0.50 mg weekly.

## 2024-07-09 NOTE — Telephone Encounter (Signed)
 Pt returned call and advised that referral has been placed.

## 2024-07-10 ENCOUNTER — Telehealth: Payer: Self-pay | Admitting: Student

## 2024-07-10 DIAGNOSIS — E1142 Type 2 diabetes mellitus with diabetic polyneuropathy: Secondary | ICD-10-CM | POA: Insufficient documentation

## 2024-07-10 NOTE — Assessment & Plan Note (Signed)
 Patient presents today reporting chronic pain in his bilateral lower extremities mostly right lower extremity.  Patient reports that he has burning sensation that is worse at night and in the morning.  On exam, no lower extremity edema is noted, DP pulses is intact, range of motion is intact.  Patient reports that he is taking Lyrica  150 mg, reports that he is taking 2 tablets in the morning and 2 tablets in the evening, total of 600 mg daily.  Patient was advised against taking 600 mg daily, due to adverse effects, not recommended for diabetic neuropathy.  Patient was counseled on medication adherence.  - Patient is counseled on to only take Lyrica  150 mg twice daily - We will start Cymbalta  30 mg daily, will reassess his symptoms in 2 to 3 months.

## 2024-07-10 NOTE — Telephone Encounter (Signed)
 Please refer to message below.  This is the patient's second time calling the clinic about wanting to schedule an injection, but unable to give any further details.  Message has been forwarded to our triage for further follow up.   Copied from CRM #[id number]. Topic: Appointments - Scheduling Inquiry for Clinic >> Jul 09, 2024  7:58 AM Donald Palmer wrote: Reason for CRM: Patient is needing a call back at [phone] to get schedule for some type of injection (patient was unsure of the type of injection)

## 2024-07-10 NOTE — Telephone Encounter (Signed)
 Patent is requesting a Steroid injection.  Will need to be scheduled on a day when an Attending will be here to assist Resident.

## 2024-07-13 NOTE — Progress Notes (Signed)
 Internal Medicine Clinic Attending  Case discussed with the resident at the time of the visit.  We reviewed the resident's history and exam and pertinent patient test results.  I agree with the assessment, diagnosis, and plan of care documented in the resident's note.

## 2024-07-15 ENCOUNTER — Encounter: Payer: Self-pay | Admitting: Cardiovascular Disease

## 2024-07-15 ENCOUNTER — Ambulatory Visit: Attending: Cardiology | Admitting: Cardiovascular Disease

## 2024-07-15 VITALS — BP 110/60 | HR 87 | Ht 69.0 in | Wt 175.0 lb

## 2024-07-15 DIAGNOSIS — I739 Peripheral vascular disease, unspecified: Secondary | ICD-10-CM | POA: Diagnosis not present

## 2024-07-15 NOTE — Progress Notes (Signed)
 07/15/2024 Donald Palmer   1961/02/24  [id number]  Primary Physician Tobie Gaines, DO Primary Cardiologist: Dorn [name] MD [name], [place]  HPI:  Donald Palmer is a 63 y.o. single African-American male father of 4, grandfather of 11 grandchildren referred by Dr. Almon for cardiovascular vascular evaluation because of symptomatic left calf claudication. I last saw him in the office 07/08/2019.SABRA His left ABI was 0.66 with nuclear popliteal artery.He has a history of 40-pack-years of tobacco as well as treated hypertension, diabetes and hyperlipidemia. He has never had a heart attack or stroke. He denies chest pain or shortness of breath. He is disabled because of his back. He is complaining of left calf claudication with segmental pressures to suggest PAD.    I then performed peripheral angiography, directional atherectomy followed by drug-coated balloon angioplasty on 03/03/18 revealing occluded left popliteal artery in the P2 segment and perform directional atherectomy with drug-eluting balloon angioplasty. His follow-up left ABI improved 0.83 and his claudication has completely resolved.   Since I saw him in the office over a year ago he apparently did well until past April when he developed recurrent claudication and numbness in his left foot.  He has continued to smoke.  Follow-up Dopplers performed 04/01/2019 revealed a decline in his left ABI to 0.72 with an occluded distal left SFA and popliteal artery.     I performed peripheral angiography and intervention on him 06/15/2019 demonstrating a mid left SFA CTO with three-vessel runoff.  I performed [facility] 1 directional atherectomy followed by drug-coated balloon angioplasty with excellent angiographic result.  His Dopplers normalized his claudication resolved.   Since I saw him 5 years ago he has been established with Dr. Elmira.  He had CABG times 311/27/24 by Dr. Shyrl.  He had angiography by Dr. Sheree last year and again in  March of this year where he underwent recanalization of occluded left SFA with long segment stenting.  Apparently because of not taking his anticoagulation he presented with recurrent leg pain and underwent angiography by Dr. Silver on 03/18/2024 with placement of a lytic catheter and follow-up angiography of the next day by Dr. Gretta showing improvement in flow with reintervention on the proximal and distal stent.  Since his procedure he continues to have claudication.  His Dopplers performed 04/22/2024 revealed a left ABI of 0.91 with patent stent and high-grade disease in his left common femoral artery.   Current Meds  Medication Sig   Accu-Chek Softclix Lancets lancets check blood sugar 2 times a day   acetaminophen  (TYLENOL ) 500 MG tablet Take 1,000 mg by mouth every 6 (six) hours as needed for mild pain (pain score 1-3) or headache.   atorvastatin  (LIPITOR ) 80 MG tablet Take 1 tablet (80 mg total) by mouth daily at 6 PM.   Blood Glucose Monitoring Suppl (ACCU-CHEK GUIDE) w/Device KIT Check blood sugar 2 times a day   clopidogrel  (PLAVIX ) 75 MG tablet Take 1 tablet (75 mg total) by mouth daily.   colchicine  0.6 MG tablet Take 1 tablet (0.6 mg total) by mouth daily as needed (gout flare).   DULoxetine  (CYMBALTA ) 30 MG capsule Take 1 capsule (30 mg total) by mouth daily.   ELIQUIS  5 MG TABS tablet TAKE 1 TABLET(5 MG) BY MOUTH TWICE DAILY   Insulin  Pen Needle (PEN NEEDLES) 32G X 4 MM MISC Use to inject liraglutide  once a day   metoprolol  tartrate (LOPRESSOR ) 25 MG tablet Take 1 tablet (25 mg total) by  mouth 2 (two) times daily.   omeprazole  (PRILOSEC) 40 MG capsule Take 1 capsule (40 mg total) by mouth daily.   pioglitazone  (ACTOS ) 30 MG tablet Take 1 tablet (30 mg total) by mouth daily.   Semaglutide ,0.25 or 0.5MG /DOS, 2 MG/3ML SOPN Inject 0.25 mg into the skin once a week.   tadalafil  (CIALIS ) 10 MG tablet Take 1 tablet (10 mg total) by mouth as needed for erectile dysfunction.     Allergies   Allergen Reactions   Metformin  And Related Other (See Comments)    GI side effects. Stopped 2016    Social History   Socioeconomic History   Marital status: Single    Spouse name: Not on file   Number of children: 4   Years of education: Not on file   Highest education level: 12th grade  Occupational History   Not on file  Tobacco Use   Smoking status: Former    Current packs/day: 0.10    Average packs/day: 0.1 packs/day for 30.0 years (3.0 ttl pk-yrs)    Types: Cigarettes, Cigars   Smokeless tobacco: Never   Tobacco comments:    Per pt, 3 cigarettes per day, as of 11/11/23  Vaping Use   Vaping status: Never Used  Substance and Sexual Activity   Alcohol  use: Not Currently    Alcohol /week: 5.0 standard drinks of alcohol     Types: 5 Cans of beer per week    Comment: Beer.   Drug use: Not Currently    Frequency: 1.0 times per week    Types: Marijuana    Comment: rarely   Sexual activity: Yes  Other Topics Concern   Not on file  Social History Narrative   Current Social History 08/15/2021        Patient lives with his daughter in a home which is 1 story. There are 8 steps up to the entrance the patient uses with a railing      Patient's method of transportation is personal car.      The highest level of education was some high school.      The patient currently disabled.      Identified important Relationships are my daughters and grandkids       Pets : 0       Interests / Fun: Web designer       Current Stressors: money       Religious / Personal Beliefs: no       Social Drivers of Corporate investment banker Strain: Medium Risk (01/06/2024)   Overall Financial Resource Strain (CARDIA)    Difficulty of Paying Living Expenses: Somewhat hard  Food Insecurity: No Food Insecurity (03/18/2024)   Hunger Vital Sign    Worried About Running Out of Food in the Last Year: Never true    Ran Out of Food in the Last Year: Never true  Recent  Concern: Food Insecurity - Food Insecurity Present (01/06/2024)   Hunger Vital Sign    Worried About Running Out of Food in the Last Year: Sometimes true    Ran Out of Food in the Last Year: Never true  Transportation Needs: No Transportation Needs (03/18/2024)   PRAPARE - Administrator, Civil Service (Medical): No    Lack of Transportation (Non-Medical): No  Physical Activity: Not on file  Stress: Not on file  Social Connections: Not on file  Intimate Partner Violence: Not At Risk (03/18/2024)   Humiliation, Afraid, Rape, and Kick questionnaire  Fear of Current or Ex-Partner: No    Emotionally Abused: No    Physically Abused: No    Sexually Abused: No     Review of Systems: General: negative for chills, fever, night sweats or weight changes.  Cardiovascular: negative for chest pain, dyspnea on exertion, edema, orthopnea, palpitations, paroxysmal nocturnal dyspnea or shortness of breath Dermatological: negative for rash Respiratory: negative for cough or wheezing Urologic: negative for hematuria Abdominal: negative for nausea, vomiting, diarrhea, bright red blood per rectum, melena, or hematemesis Neurologic: negative for visual changes, syncope, or dizziness All other systems reviewed and are otherwise negative except as noted above.    Blood pressure 110/60, pulse 87, height 5' 9 (1.753 m), weight 175 lb (79.4 kg), SpO2 97%.  General appearance: alert and no distress Neck: no adenopathy, no carotid bruit, no JVD, supple, symmetrical, trachea midline, and thyroid  not enlarged, symmetric, no tenderness/mass/nodules Lungs: clear to auscultation bilaterally Heart: regular rate and rhythm, S1, S2 normal, no murmur, click, rub or gallop Extremities: extremities normal, atraumatic, no cyanosis or edema Pulses: 2+ and symmetric Skin: Skin color, texture, turgor normal. No rashes or lesions Neurologic: Grossly normal  EKG      ASSESSMENT AND PLAN:   PAD (peripheral  artery disease) (HCC) History of PAD status post above-the-knee popliteal [place] 1 directional atherectomy followed by [facility] by myself 03/03/2018 with recurrent disease 06/15/2019 with intervention of the mid left SFA.  He apparently was sent to Dr. Sheree who initially performed angiography 09/09/2023 at which time he had recommended common femoral angioplasty with femoropopliteal bypass grafting.  Ultimately patient came back 02/24/2024 and underwent complex intervention of the occluded left SFA with stenting from the above-the-knee popliteal up to near the origin of the left SFA.  Apparently he had stopped taking his anticoagulation therapy and re presented/2/25 and underwent angiography by Dr. Silver revealing an occluded left SFA.  A thrombolytic catheter was placed and the patient underwent reangiography the following day by Dr. Gretta and intervention of the distal stent and proximal stent.  Since that time he has had persistent claudication and his most recent Dopplers performed 04/22/2024 revealed a left ABI of 0.91 with a high-frequency signal just proximal to the stented segment.  I am going to repeat lower extremity arterial Doppler studies.  I suspect ultimately he is going to need femoropopliteal bypass grafting.     Dorn DOROTHA Lesches MD FACP,FACC,FAHA, [facility] 07/15/2024 9:18 AM

## 2024-07-15 NOTE — Assessment & Plan Note (Signed)
 History of PAD status post above-the-knee popliteal Hawk 1 directional atherectomy followed by [facility] by myself 03/03/2018 with recurrent disease 06/15/2019 with intervention of the mid left SFA.  He apparently was sent to Dr. Sheree who initially performed angiography 09/09/2023 at which time he had recommended common femoral angioplasty with femoropopliteal bypass grafting.  Ultimately patient came back 02/24/2024 and underwent complex intervention of the occluded left SFA with stenting from the above-the-knee popliteal up to near the origin of the left SFA.  Apparently he had stopped taking his anticoagulation therapy and re presented/2/25 and underwent angiography by Dr. Silver revealing an occluded left SFA.  A thrombolytic catheter was placed and the patient underwent reangiography the following day by Dr. Gretta and intervention of the distal stent and proximal stent.  Since that time he has had persistent claudication and his most recent Dopplers performed 04/22/2024 revealed a left ABI of 0.91 with a high-frequency signal just proximal to the stented segment.  I am going to repeat lower extremity arterial Doppler studies.  I suspect ultimately he is going to need femoropopliteal bypass grafting.

## 2024-07-15 NOTE — Patient Instructions (Signed)
 Medication Instructions:  Your physician recommends that you continue on your current medications as directed. Please refer to the Current Medication list given to you today.  *If you need a refill on your cardiac medications before your next appointment, please call your pharmacy*  Testing/Procedures: Your physician has requested that you have a lower extremity arterial duplex. During this test, ultrasound is used to evaluate arterial blood flow in the legs. Allow one hour for this exam. There are no restrictions or special instructions. This will take place at [address], 4th floor  Please note: We ask at that you not bring children with you during ultrasound (echo/ vascular) testing. Due to room size and safety concerns, children are not allowed in the ultrasound rooms during exams. Our front office staff cannot provide observation of children in our lobby area while testing is being conducted. An adult accompanying a patient to their appointment will only be allowed in the ultrasound room at the discretion of the ultrasound technician under special circumstances. We apologize for any inconvenience.  Your physician has requested that you have an ankle brachial index (ABI). During this test an ultrasound and blood pressure cuff are used to evaluate the arteries that supply the arms and legs with blood. Allow thirty minutes for this exam. There are no restrictions or special instructions. This will take place at [address], 4th floor   Please note: We ask at that you not bring children with you during ultrasound (echo/ vascular) testing. Due to room size and safety concerns, children are not allowed in the ultrasound rooms during exams. Our front office staff cannot provide observation of children in our lobby area while testing is being conducted. An adult accompanying a patient to their appointment will only be allowed in the ultrasound room at the discretion of the ultrasound technician  under special circumstances. We apologize for any inconvenience.   Follow-Up: At [facility], you and your health needs are our priority.  As part of our continuing mission to provide you with exceptional heart care, our providers are all part of one team.  This team includes your primary Cardiologist (physician) and Advanced Practice Providers or APPs (Physician Assistants and Nurse Practitioners) who all work together to provide you with the care you need, when you need it.  Your next appointment:   To be determined  Provider:   Dorn Lesches, MD    We recommend signing up for the patient portal called MyChart.  Sign up information is provided on this After Visit Summary.  MyChart is used to connect with patients for Virtual Visits (Telemedicine).  Patients are able to view lab/test results, encounter notes, upcoming appointments, etc.  Non-urgent messages can be sent to your provider as well.   To learn more about what you can do with MyChart, go to [email or web address].

## 2024-07-16 ENCOUNTER — Ambulatory Visit: Admitting: Family Medicine

## 2024-07-22 ENCOUNTER — Encounter: Payer: Self-pay | Admitting: Vascular Surgery

## 2024-07-22 ENCOUNTER — Ambulatory Visit: Payer: Self-pay | Admitting: Cardiovascular Disease

## 2024-07-22 ENCOUNTER — Ambulatory Visit (HOSPITAL_COMMUNITY)
Admission: RE | Admit: 2024-07-22 | Discharge: 2024-07-22 | Disposition: A | Discharge status: Home / Self Care | Source: Ambulatory Visit | Attending: Vascular Surgery | Admitting: Vascular Surgery

## 2024-07-22 ENCOUNTER — Ambulatory Visit (HOSPITAL_BASED_OUTPATIENT_CLINIC_OR_DEPARTMENT_OTHER)
Admission: RE | Admit: 2024-07-22 | Discharge: 2024-07-22 | Disposition: A | Discharge status: Home / Self Care | Source: Ambulatory Visit | Attending: Vascular Surgery | Admitting: Vascular Surgery

## 2024-07-22 ENCOUNTER — Ambulatory Visit: Attending: Vascular Surgery | Admitting: Vascular Surgery

## 2024-07-22 VITALS — BP 120/75 | HR 74 | Temp 97.9°F | Ht 69.0 in | Wt 176.0 lb

## 2024-07-22 DIAGNOSIS — I739 Peripheral vascular disease, unspecified: Secondary | ICD-10-CM | POA: Insufficient documentation

## 2024-07-22 LAB — VAS US ABI WITH/WO TBI
Left ABI: 0.79
Right ABI: 0.73

## 2024-07-22 NOTE — Progress Notes (Signed)
 [name], male   DOB: 1961/05/30, 63 y.o.   MRN: [id number]  Reason for Consult: Follow-up   Referred by Tobie Gaines, DO  Subjective:     HPI:  Donald Palmer is a 63 y.o. male with a history of left lower extremity endovascular intervention in 2019 for claudication and subsequent follow-up reintervention in 2020.  He then underwent angiography for occlusion of his left SFA was planned for bypass graft however required CABG in 2024 and ultimately elected for repeat endovascular intervention in March of this year for rest pain of the left lower extremity with moderately depressed ABIs.  The day following the procedure he began having pain of the left foot and ankle which he states has been present since the initial procedure and was initially considered reperfusion pain.  He did not take Eliquis  or Plavix  as previously prescribed when he was on vacation in [place]  and subsequently occluded his stents in early April and underwent lytic therapy with subsequent angioplasty of the proximal left SFA stent and distal left above-knee popliteal stent.  Patient returns today stating that he still has pain in the left ankle.  Noninvasive studies were performed prior to his visit today.  Past Medical History:  Diagnosis Date   Back pain, chronic    Coronary artery disease    Diabetes mellitus 2007   Hepatitis C    [id number]   History of chronic hepatitis C 05/13/2012   First noted 2013. RF inc tattoos obtained while incarcerated. [place]  2016 SVR 2017    Hypertension goal BP (blood pressure) < 140/80    Microcytic anemia    Neuromuscular disorder (HCC)    PAD (peripheral artery disease) (HCC)    Pancreatitis 01/09/2012   Family History  Problem Relation Age of Onset   Diabetes Mother    Hypertension Mother    Hyperlipidemia Mother    Asthma Sister    Obesity Sister    Diabetes Brother    Diabetes Brother    Diabetes Daughter    Heart attack Neg Hx    Sudden death Neg Hx     Past Surgical History:  Procedure Laterality Date   ABDOMINAL AORTOGRAM N/A 03/03/2018   Procedure: ABDOMINAL AORTOGRAM;  Surgeon: Court Dorn PARAS, MD;  Location: [facility];  Service: Cardiovascular;  Laterality: N/A;   ABDOMINAL AORTOGRAM W/LOWER EXTREMITY N/A 06/15/2019   Procedure: ABDOMINAL AORTOGRAM W/LOWER EXTREMITY;  Surgeon: Court Dorn PARAS, MD;  Location: [facility];  Service: Cardiovascular;  Laterality: N/A;   ABDOMINAL AORTOGRAM W/LOWER EXTREMITY N/A 09/09/2023   Procedure: ABDOMINAL AORTOGRAM W/LOWER EXTREMITY;  Surgeon: Sheree Penne Bruckner, MD;  Location: [facility] INVASIVE CV LAB;  Service: Cardiovascular;  Laterality: N/A;   ABDOMINAL AORTOGRAM W/LOWER EXTREMITY N/A 02/24/2024   Procedure: ABDOMINAL AORTOGRAM W/LOWER EXTREMITY;  Surgeon: Sheree Penne Bruckner, MD;  Location: [facility] INVASIVE CV LAB;  Service: Cardiovascular;  Laterality: N/A;   ABDOMINAL AORTOGRAM W/LOWER EXTREMITY N/A 03/18/2024   Procedure: ABDOMINAL AORTOGRAM W/LOWER EXTREMITY;  Surgeon: Lanis Fonda BRAVO, MD;  Location: [facility] INVASIVE CV LAB;  Service: Cardiovascular;  Laterality: N/A;   CORONARY ARTERY BYPASS GRAFT N/A 11/13/2023   Procedure: CORONARY ARTERY BYPASS GRAFTING X 3, USING LEFT INTERNAL MAMMARY ARTERY AND ENDOSCOPICALLY HARVESTED RIGHT SAPHENOUS VEIN GRAFT;  Surgeon: Shyrl Linnie KIDD, MD;  Location: [facility] OR;  Service: Open Heart Surgery;  Laterality: N/A;   I & D EXTREMITY Right 05/15/2016   Procedure: RIGHT INDEX FINGER FIRST THROUGH  MIDDLE PHALYNX  AMPUTATION;  Surgeon: Alm Hummer, MD;  Location: [facility] OR;  Service: Orthopedics;  Laterality: Right;   LEFT HEART CATH AND CORONARY ANGIOGRAPHY N/A 10/16/2023   Procedure: LEFT HEART CATH AND CORONARY ANGIOGRAPHY;  Surgeon: Elmira Newman PARAS, MD;  Location: [facility];  Service: Cardiovascular;  Laterality: N/A;   LOWER EXTREMITY INTERVENTION Bilateral 03/03/2018   Procedure: LOWER EXTREMITY INTERVENTION;  Surgeon: Court Dorn PARAS, MD;  Location: [facility];  Service: Cardiovascular;  Laterality: Bilateral;   LOWER EXTREMITY INTERVENTION  02/24/2024   Procedure: LOWER EXTREMITY INTERVENTION;  Surgeon: Sheree Penne Bruckner, MD;  Location: [facility] INVASIVE CV LAB;  Service: Cardiovascular;;   LOWER EXTREMITY INTERVENTION  03/18/2024   Procedure: LOWER EXTREMITY INTERVENTION;  Surgeon: Lanis Fonda BRAVO, MD;  Location: [facility] INVASIVE CV LAB;  Service: Cardiovascular;;   OPEN REDUCTION INTERNAL FIXATION (ORIF) DISTAL PHALANX Right 04/02/2016   Procedure: RIGHT INDEX FINGER REPAIR;  Surgeon: Alm Hummer, MD;  Location: [facility];  Service: Orthopedics;  Laterality: Right;   PERIPHERAL VASCULAR ATHERECTOMY Left 03/03/2018   Procedure: PERIPHERAL VASCULAR ATHERECTOMY;  Surgeon: Court Dorn PARAS, MD;  Location: [facility] INVASIVE CV LAB;  Service: Cardiovascular;  Laterality: Left;  SFA WITH PTA DRUG COATED BALLOON   PERIPHERAL VASCULAR INTERVENTION Left 06/15/2019   Procedure: PERIPHERAL VASCULAR INTERVENTION;  Surgeon: Court Dorn PARAS, MD;  [facility];  Service: Cardiovascular;  Laterality: Left;   PERIPHERAL VASCULAR THROMBECTOMY N/A 03/19/2024   Procedure: PERIPHERAL VASCULAR THROMBECTOMY;  Surgeon: Gretta Bruckner PARAS, MD;  Location: [facility];  Service: Cardiovascular;  Laterality: N/A;   TEE WITHOUT CARDIOVERSION N/A 11/13/2023   Procedure: TRANSESOPHAGEAL ECHOCARDIOGRAM;  Surgeon: Shyrl Linnie KIDD, MD;  Location: [facility] OR;  Service: Open Heart Surgery;  Laterality: N/A;    Short Social History:  Social History   Tobacco Use   Smoking status: Former    Current packs/day: 0.10    Average packs/day: 0.1 packs/day for 30.0 years (3.0 ttl pk-yrs)    Types: Cigarettes, Cigars   Smokeless tobacco: Never   Tobacco comments:    Per pt, 3 cigarettes per day, as of 11/11/23  Substance Use Topics   Alcohol  use: Not Currently    Alcohol /week: 5.0 standard drinks of alcohol      Types: 5 Cans of beer per week    Comment: Beer.    Allergies  Allergen Reactions   Metformin  And Related Other (See Comments)    GI side effects. Stopped 2016    Current Outpatient Medications  Medication Sig Dispense Refill   Accu-Chek Softclix Lancets lancets check blood sugar 2 times a day 100 each 5   acetaminophen  (TYLENOL ) 500 MG tablet Take 1,000 mg by mouth every 6 (six) hours as needed for mild pain (pain score 1-3) or headache.     atorvastatin  (LIPITOR ) 80 MG tablet Take 1 tablet (80 mg total) by mouth daily at 6 PM. 90 tablet 3   Blood Glucose Monitoring Suppl (ACCU-CHEK GUIDE) w/Device KIT Check blood sugar 2 times a day 1 kit 1   clopidogrel  (PLAVIX ) 75 MG tablet Take 1 tablet (75 mg total) by mouth daily. 90 tablet 3   colchicine  0.6 MG tablet Take 1 tablet (0.6 mg total) by mouth daily as needed (gout flare). 30 tablet 2   DULoxetine  (CYMBALTA ) 30 MG capsule Take 1 capsule (30 mg total) by mouth daily. 30 capsule 2   ELIQUIS  5 MG TABS tablet TAKE 1 TABLET(5 MG) BY MOUTH TWICE DAILY  60 tablet 1   Insulin  Pen Needle (PEN NEEDLES) 32G X 4 MM MISC Use to inject liraglutide  once a day 100 each 3   metoprolol  tartrate (LOPRESSOR ) 25 MG tablet Take 1 tablet (25 mg total) by mouth 2 (two) times daily. 60 tablet 2   omeprazole  (PRILOSEC) 40 MG capsule Take 1 capsule (40 mg total) by mouth daily. 90 capsule 3   pioglitazone  (ACTOS ) 30 MG tablet Take 1 tablet (30 mg total) by mouth daily. 30 tablet 11   Semaglutide ,0.25 or 0.5MG /DOS, 2 MG/3ML SOPN Inject 0.25 mg into the skin once a week. 3 mL 2   tadalafil  (CIALIS ) 10 MG tablet Take 1 tablet (10 mg total) by mouth as needed for erectile dysfunction. 20 tablet 1   No current facility-administered medications for this visit.    Review of Systems  Constitutional:  Constitutional negative. HENT: HENT negative.  Eyes: Eyes negative.  Respiratory: Respiratory negative.  Cardiovascular: Cardiovascular negative.  GI:  Gastrointestinal negative.  Musculoskeletal: Positive for leg pain.  Skin: Skin negative.  Neurological: Positive for numbness.  Hematologic: Hematologic/lymphatic negative.  Psychiatric: Psychiatric negative.        Objective:  Objective   Vitals:   07/22/24 1112  BP: 120/75  Pulse: 74  Temp: 97.9 F (36.6 C)  SpO2: 99%  Weight: 176 lb (79.8 kg)  Height: 5' 9 (1.753 m)   Body mass index is 25.99 kg/m.  Physical Exam HENT:     Head: Normocephalic.     Nose: Nose normal.     Mouth/Throat:     Mouth: Mucous membranes are moist.  Eyes:     Pupils: Pupils are equal, round, and reactive to light.  Cardiovascular:     Pulses:          Femoral pulses are 2+ on the right side and 2+ on the left side.      Popliteal pulses are 0 on the left side.       Dorsalis pedis pulses are 0 on the left side.       Posterior tibial pulses are 0 on the left side.  Pulmonary:     Effort: Pulmonary effort is normal.  Abdominal:     General: Abdomen is flat.  Skin:    General: Skin is warm.     Capillary Refill: Capillary refill takes less than 2 seconds.  Neurological:     General: No focal deficit present.     Mental Status: He is alert.  Psychiatric:        Mood and Affect: Mood normal.     Data: ABI Findings:  +---------+------------------+-----+--------+--------+  Right   Rt Pressure (mmHg)IndexWaveformComment   +---------+------------------+-----+--------+--------+  Brachial 113                                      +---------+------------------+-----+--------+--------+  PTA     106               0.73 biphasic          +---------+------------------+-----+--------+--------+  DP      89                0.61 biphasic          +---------+------------------+-----+--------+--------+  Great Toe50                0.34 Abnormal          +---------+------------------+-----+--------+--------+   +---------+------------------+-----+---------+-------+  Left    Lt Pressure (mmHg)IndexWaveform Comment  +---------+------------------+-----+---------+-------+  Brachial 146                                      +---------+------------------+-----+---------+-------+  PTA     115               0.79 triphasic         +---------+------------------+-----+---------+-------+  DP      99                0.68 biphasic          +---------+------------------+-----+---------+-------+  Great Toe42                0.29 Normal            +---------+------------------+-----+---------+-------+   +-------+-----------+-----------+------------+------------+  ABI/TBIToday's ABIToday's TBIPrevious ABIPrevious TBI  +-------+-----------+-----------+------------+------------+  Right 0.73       0.34       0.9         0.6           +-------+-----------+-----------+------------+------------+  Left  0.79       0.29       0.91        0.76          +-------+-----------+-----------+------------+------------+         Summary:  Right: Resting right ankle-brachial index indicates moderate right lower  extremity arterial disease. The right toe-brachial index is abnormal.   Left: Resting left ankle-brachial index indicates moderate left lower  extremity arterial disease. The left toe-brachial index is abnormal.   +-----------+--------+-----+---------------+---------+--------+  RIGHT     PSV cm/sRatioStenosis       Waveform Comments  +-----------+--------+-----+---------------+---------+--------+  CFA Prox   176                         biphasic           +-----------+--------+-----+---------------+---------+--------+  CFA Mid    196          30-49% stenosisbiphasic           +-----------+--------+-----+---------------+---------+--------+  CFA Distal 98                          biphasic           +-----------+--------+-----+---------------+---------+--------+  DFA       137          30-49%  stenosistriphasic          +-----------+--------+-----+---------------+---------+--------+  SFA Prox   145          30-49% stenosistriphasic          +-----------+--------+-----+---------------+---------+--------+  SFA Mid    357     8.7  50-74% stenosistriphasic          +-----------+--------+-----+---------------+---------+--------+  SFA Distal 73                          triphasic          +-----------+--------+-----+---------------+---------+--------+  POP Prox   68                          triphasic          +-----------+--------+-----+---------------+---------+--------+  POP Mid    74  triphasic          +-----------+--------+-----+---------------+---------+--------+  ATA Distal 41                          triphasic          +-----------+--------+-----+---------------+---------+--------+  PTA Distal 40                          biphasic           +-----------+--------+-----+---------------+---------+--------+  PERO Distal34                          biphasic           +-----------+--------+-----+---------------+---------+--------+   A focal velocity elevation of 357 cm/s was obtained at SFA MT with a VR of  8.7. Findings are characteristic of 50-74% stenosis.     +-----------+--------+-----+---------------+---------+--------+  LEFT      PSV cm/sRatioStenosis       Waveform Comments  +-----------+--------+-----+---------------+---------+--------+  EIA Distal 176                         triphasic          +-----------+--------+-----+---------------+---------+--------+  CFA Prox   160                         triphasic          +-----------+--------+-----+---------------+---------+--------+  CFA Mid    132                         triphasic          +-----------+--------+-----+---------------+---------+--------+  CFA Distal 381          50-74% stenosistriphasic           +-----------+--------+-----+---------------+---------+--------+  DFA       89                          triphasic          +-----------+--------+-----+---------------+---------+--------+  SFA Distal 81                          triphasic          +-----------+--------+-----+---------------+---------+--------+  POP Prox   79                          triphasic          +-----------+--------+-----+---------------+---------+--------+  POP Distal 79                          triphasic          +-----------+--------+-----+---------------+---------+--------+  ATA Distal 38                          biphasic           +-----------+--------+-----+---------------+---------+--------+  PTA Distal 55                          triphasic          +-----------+--------+-----+---------------+---------+--------+  PERO Distal35  biphasic           +-----------+--------+-----+---------------+---------+--------+   A focal velocity elevation of 381 cm/s was obtained at CFA Dst with a VR  of 2.9. Findings are characteristic of 50-74% stenosis.    Left Stent(s):  +---------------+--------+---------------+---------+--------+  SFA           PSV cm/sStenosis       Waveform Comments  +---------------+--------+---------------+---------+--------+  Prox to Stent  331     50-99% stenosisbiphasic           +---------------+--------+---------------+---------+--------+  Proximal Stent 176                    biphasic           +---------------+--------+---------------+---------+--------+  Mid Stent      65                     triphasic          +---------------+--------+---------------+---------+--------+  Distal Stent   87                     triphasic          +---------------+--------+---------------+---------+--------+  Distal to Stent76                     triphasic           +---------------+--------+---------------+---------+--------+            Summary:  Right: 30-49% stenosis noted in the common femoral artery. 30-49% stenosis  noted in the deep femoral artery. 50-74% stenosis noted in the superficial  femoral artery.   Left: 50-74% stenosis noted in the common femoral artery. Patent stent  with no evidence of stenosis in the superficial femoral artery.       Assessment/Plan:     63 year old male with history as above now with evidence of stenosis of the proximal SFA just proximal to the stent.  We initially plan for left common femoral to popliteal artery bypass but proceeded with stenting due to the patient requiring CABG.  Unfortunately patient has had significant pain since the placement of the most recent stents and even required lytic therapy.  At this time patient wants no further interventions from [facility] and request to be evaluated only by Dr. Court.  If patient requires surgery he can be seen by me or one of my partners in the future.     Penne Lonni Colorado MD [facility]

## 2024-07-23 ENCOUNTER — Ambulatory Visit: Admitting: Family Medicine

## 2024-07-23 ENCOUNTER — Other Ambulatory Visit: Payer: Self-pay

## 2024-07-23 ENCOUNTER — Telehealth: Payer: Self-pay | Admitting: Cardiovascular Disease

## 2024-07-23 ENCOUNTER — Ambulatory Visit (INDEPENDENT_AMBULATORY_CARE_PROVIDER_SITE_OTHER): Admitting: Family Medicine

## 2024-07-23 VITALS — BP 116/84 | Ht 69.0 in | Wt 177.0 lb

## 2024-07-23 DIAGNOSIS — Z794 Long term (current) use of insulin: Secondary | ICD-10-CM

## 2024-07-23 DIAGNOSIS — I82409 Acute embolism and thrombosis of unspecified deep veins of unspecified lower extremity: Secondary | ICD-10-CM

## 2024-07-23 DIAGNOSIS — S46012A Strain of muscle(s) and tendon(s) of the rotator cuff of left shoulder, initial encounter: Secondary | ICD-10-CM | POA: Diagnosis not present

## 2024-07-23 DIAGNOSIS — G8929 Other chronic pain: Secondary | ICD-10-CM

## 2024-07-23 DIAGNOSIS — Z7901 Long term (current) use of anticoagulants: Secondary | ICD-10-CM

## 2024-07-23 DIAGNOSIS — E1140 Type 2 diabetes mellitus with diabetic neuropathy, unspecified: Secondary | ICD-10-CM | POA: Diagnosis not present

## 2024-07-23 MED ORDER — METHYLPREDNISOLONE ACETATE 40 MG/ML IJ SUSP
40.0000 mg | Freq: Once | INTRAMUSCULAR | Status: AC
  Administered 2024-07-23: 40 mg via INTRA_ARTICULAR

## 2024-07-23 NOTE — Telephone Encounter (Signed)
Pt calling to get CT results.  

## 2024-07-23 NOTE — Telephone Encounter (Signed)
 Donald [name], MD 07/22/2024 12:10 PM EDT     Decreased ABIs bilaterally compared to prior study.  Needs return office visit to discuss   Went over the information above and scheduled f/u appt with Dr Palmer for 07/28/24 at 9:15.

## 2024-07-23 NOTE — Assessment & Plan Note (Addendum)
 Ultrasound performed in office shows tears of the supraspinatus, infraspinatus as well as subscapularis and mild osteoarthritis of the AC joint.  Counseled patient on the fact that multiple corticosteroid injections are likely causing more tendon damage, and that patient will likely need surgery to repair his rotator cuff tendons.  Will proceed with MRI and surgical consultation as indicated after imaging is complete.  Patient counseled on risks and benefits of corticosteroid injection, given his multiple injections, T2DM, Eliquis  use.  Patient elected to proceed with subacromial injection which was performed in office today. -Order MRI of the left shoulder without contrast - [place] guided subacromial injection performed

## 2024-07-23 NOTE — Progress Notes (Unsigned)
   Established Patient Office Visit  Subjective   [name], male    DOB: 07/31/1961  Age: 63 y.o. MRN: [id number]  No chief complaint on file.   HPI  Patient states that he fell through a manhole 2 years ago and has been having shoulder pain ever since then.  Patient cannot pinpoint the location of his shoulder pain and states that it is all over he states that he has been getting shoulder injections every 3 months which helps control his pain.   ROS Medically appropriate ROS negative except for what is stated above   Objective:     BP 116/84   Ht 5' 9 (1.753 m)   Wt 177 lb (80.3 kg)   BMI 26.14 kg/m     Physical Exam General: NAD, well-appearing Shoulder: Inspection: No asymmetry, atrophy, erythema, deformity Palpation: TTP at [facility] joint, anterior/posterior glenohumeral joint. Range of motion: Full range of motion with forward flexion, extension, external and internal rotation, horizontal adduction.  Abduction limited to around 90 degrees, passively can get to around 150 degrees Strength: 5 out of 5 strength with resisted internal and external rotation, resisted abduction, speeds test positive, Yergason's negative Negative Neer's and Hawkins Positive crossover test Negative labral grind test  Cervical spine: Full range of motion with flexion, extension, twisting, sidebending of cervical spine.  No tenderness to palpation of spinous processes or paraspinous muscles Negative forward flexion test  Ultrasound of the left shoulder: No evidence of joint effusion.  There is fluid distending the biceps brachii tendon sheath, as well as biceps brachii tendon tear.  Chronic tendinopathy as well as interspersed partial-thickness tears at the intersional footplate and intersubstance tearing also appreciated in the subscapularis. In the supraspinatus, chronic tendinopathy along with full thickness and intrasubstance tearing. Infraspinatus with flattening of the tendon and  partial tearing as well as chronic tendionpatyhy.  Teres minor tendon normal without tendinosis, tear.  Osteophytes present at the acromion, as well as a small effusion. Impression: Partial tear of the supraspinatus, infraspinatus, subscapularis tendons.  Mild osteoarthritis at the [facility] joint  No results found for any visits on 07/23/24.     The 10-year ASCVD risk score (Arnett DK, et al., 2019) is: 22.6%    Assessment & Plan:   Problem List Items Addressed This Visit     Type 2 diabetes mellitus with diabetic neuropathy (HCC) (Chronic)   Chronic left shoulder pain - Primary   Ultrasound performed in office shows tears of the supraspinatus, infraspinatus as well as subscapularis and mild osteoarthritis of the AC joint.  Counseled patient on the fact that multiple corticosteroid injections are likely causing more tendon damage, and that patient will likely need surgery to repair his rotator cuff tendons.  Will proceed with MRI and surgical consultation as indicated after imaging is complete.  Patient counseled on risks and benefits of corticosteroid injection, given his multiple injections, T2DM, Eliquis  use.  Patient elected to proceed with subacromial injection which was performed in office today. -Order MRI of the left shoulder without contrast - Landmark guided subacromial injection performed      Relevant Orders   US  COMPLETE JOINT SPACE STRUCTURE UP LEFT   MR SHOULDER LEFT WO CONTRAST    No follow-ups on file.    Rainell JAYSON Cedar, DO

## 2024-07-24 NOTE — Patient Instructions (Signed)

## 2024-07-28 ENCOUNTER — Ambulatory Visit: Attending: Cardiovascular Disease | Admitting: Cardiovascular Disease

## 2024-07-28 ENCOUNTER — Encounter: Payer: Self-pay | Admitting: Cardiovascular Disease

## 2024-07-28 VITALS — BP 114/70 | HR 91 | Ht 69.0 in | Wt 167.4 lb

## 2024-07-28 DIAGNOSIS — I739 Peripheral vascular disease, unspecified: Secondary | ICD-10-CM | POA: Diagnosis not present

## 2024-07-28 MED ORDER — CILOSTAZOL 50 MG PO TABS: 50.0000 mg | ORAL_TABLET | Freq: Two times a day (BID) | ORAL | 1 refills | Status: DC

## 2024-07-28 NOTE — Patient Instructions (Signed)
 Medication Instructions:  Your physician has recommended you make the following change in your medication:   -Start cilostazol (pletal) 50mg  twice daily.  *If you need a refill on your cardiac medications before your next appointment, please call your pharmacy*  Follow-Up: At [facility], you and your health needs are our priority.  As part of our continuing mission to provide you with exceptional heart care, our providers are all part of one team.  This team includes your primary Cardiologist (physician) and Advanced Practice Providers or APPs (Physician Assistants and Nurse Practitioners) who all work together to provide you with the care you need, when you need it.  Your next appointment:   3 month(s)  Provider:   Lauro Portal, MD    We recommend signing up for the patient portal called "MyChart".  Sign up information is provided on this After Visit Summary.  MyChart is used to connect with patients for Virtual Visits (Telemedicine).  Patients are able to view lab/test results, encounter notes, upcoming appointments, etc.  Non-urgent messages can be sent to your provider as well.   To learn more about what you can do with MyChart, go to [email or web address].

## 2024-07-28 NOTE — Progress Notes (Signed)
 Donald Palmer returns for early follow-up.  I just saw him in the office 07/15/2024.  He has had multiple interventions on his left leg most recently by Drs. Lanis and Gretta 03/18/24 which showed occlusion of his left SFA stent.  He underwent aspiration thrombectomy and lysis as well as PTA of inflow and outflow the following day.  I have reviewed the angiograms of Dr. Gaylene which did show some disease in the left common femoral artery.  He underwent drug-coated balloon angioplasty there and of the distal edge of the stent in the popliteal artery.  He had three-vessel runoff with tibial vessels down to the foot.  He said since his procedure he has had numbness in his foot as well as chronic pain.  He was prescribed Lyrica  which apparently has helped.  Recent Doppler studies performed 07/22/2024 revealed a patent left SFA stent with a high-frequency signal in the left common femoral artery at the site of prior angioplasty although his signals were all triphasic.  There has been a decline in his left ABI although I do not think his symptoms are necessarily ischemically mediated.  And we will put him on Pletal  50 mg p.o. twice daily will see back in 3 months.  At this point, I do not feel reangiography is indicated.   Donald Palmer, M.D., FACP, [facility], FAHA, [facility]  [address] [place], [place]  [address]  [phone] 07/28/2024 10:05 AM

## 2024-08-13 ENCOUNTER — Other Ambulatory Visit: Payer: Self-pay | Admitting: *Deleted

## 2024-08-13 DIAGNOSIS — Z794 Long term (current) use of insulin: Secondary | ICD-10-CM

## 2024-08-13 MED ORDER — ACCU-CHEK GUIDE TEST VI STRP: ORAL_STRIP | 12 refills | Status: DC

## 2024-08-24 ENCOUNTER — Other Ambulatory Visit: Payer: Self-pay | Admitting: Vascular Surgery

## 2024-09-06 ENCOUNTER — Emergency Department (HOSPITAL_COMMUNITY)
Admission: EM | Admit: 2024-09-06 | Discharge: 2024-09-06 | Disposition: A | Discharge status: Home / Self Care | Attending: Emergency Medicine | Admitting: Emergency Medicine

## 2024-09-06 ENCOUNTER — Other Ambulatory Visit: Payer: Self-pay

## 2024-09-06 ENCOUNTER — Emergency Department (HOSPITAL_COMMUNITY)

## 2024-09-06 ENCOUNTER — Encounter (HOSPITAL_COMMUNITY): Payer: Self-pay

## 2024-09-06 DIAGNOSIS — M79605 Pain in left leg: Secondary | ICD-10-CM

## 2024-09-06 DIAGNOSIS — Z7901 Long term (current) use of anticoagulants: Secondary | ICD-10-CM | POA: Diagnosis not present

## 2024-09-06 DIAGNOSIS — M79661 Pain in right lower leg: Secondary | ICD-10-CM | POA: Diagnosis present

## 2024-09-06 DIAGNOSIS — Z794 Long term (current) use of insulin: Secondary | ICD-10-CM | POA: Diagnosis not present

## 2024-09-06 DIAGNOSIS — M79662 Pain in left lower leg: Secondary | ICD-10-CM | POA: Insufficient documentation

## 2024-09-06 LAB — COMPREHENSIVE METABOLIC PANEL WITH GFR
ALT: 15 U/L (ref 0–44)
AST: 19 U/L (ref 15–41)
Albumin: 3.3 g/dL — ABNORMAL LOW (ref 3.5–5.0)
Alkaline Phosphatase: 77 U/L (ref 38–126)
Anion gap: 8 (ref 5–15)
BUN: 11 mg/dL (ref 8–23)
CO2: 26 mmol/L (ref 22–32)
Calcium: 9 mg/dL (ref 8.9–10.3)
Chloride: 102 mmol/L (ref 98–111)
Creatinine, Ser: 1.18 mg/dL (ref 0.61–1.24)
GFR, Estimated: >60 mL/min (ref >60)
Glucose, Bld: 230 mg/dL — ABNORMAL HIGH (ref 70–99)
Potassium: 3.8 mmol/L (ref 3.5–5.1)
Sodium: 136 mmol/L (ref 135–145)
Total Bilirubin: 0.3 mg/dL (ref 0.0–1.2)
Total Protein: 6.4 g/dL — ABNORMAL LOW (ref 6.5–8.1)

## 2024-09-06 LAB — CBC WITH DIFFERENTIAL/PLATELET
Abs Immature Granulocytes: 0.01 K/uL (ref 0.00–0.07)
Basophils Absolute: 0.1 K/uL (ref 0.0–0.1)
Basophils Relative: 1 %
Eosinophils Absolute: 0.1 K/uL (ref 0.0–0.5)
Eosinophils Relative: 1 %
HCT: 36.7 % — ABNORMAL LOW (ref 39.0–52.0)
Hemoglobin: 11.2 g/dL — ABNORMAL LOW (ref 13.0–17.0)
Immature Granulocytes: 0 %
Lymphocytes Relative: 41 %
Lymphs Abs: 2.7 K/uL (ref 0.7–4.0)
MCH: 21.8 pg — ABNORMAL LOW (ref 26.0–34.0)
MCHC: 30.5 g/dL (ref 30.0–36.0)
MCV: 71.4 fL — ABNORMAL LOW (ref 80.0–100.0)
Monocytes Absolute: 0.7 K/uL (ref 0.1–1.0)
Monocytes Relative: 10 %
Neutro Abs: 3.2 K/uL (ref 1.7–7.7)
Neutrophils Relative %: 47 %
Platelets: 246 K/uL (ref 150–400)
RBC: 5.14 MIL/uL (ref 4.22–5.81)
RDW: 16.7 % — ABNORMAL HIGH (ref 11.5–15.5)
WBC: 6.6 K/uL (ref 4.0–10.5)
nRBC: 0 % (ref 0.0–0.2)

## 2024-09-06 MED ORDER — IOHEXOL 350 MG/ML SOLN
100.0000 mL | Freq: Once | INTRAVENOUS | Status: AC | PRN
  Administered 2024-09-06: 100 mL via INTRAVENOUS

## 2024-09-06 MED ORDER — HYDROCODONE-ACETAMINOPHEN 5-325 MG PO TABS
1.0000 | ORAL_TABLET | Freq: Once | ORAL | Status: AC
  Administered 2024-09-06: 1 via ORAL
  Filled 2024-09-06: qty 1

## 2024-09-06 NOTE — ED Notes (Signed)
 Pt resting with eyes closed. Respirations 14.

## 2024-09-06 NOTE — Discharge Instructions (Addendum)
 Today you were seen for bilateral leg pain.  You may take Tylenol  as needed for pain.  Please follow-up with vascular surgery with Dr. Sheree or one of his partners by reaching out through epic or cardiology for further evaluation workup.  Please return to the ED if you have numbness, weakness, or paralysis.  Thank you for letting us  treat you today. After reviewing your labs and imaging, I feel you are safe to go home. Please follow up with your PCP in the next several days and provide them with your records from this visit. Return to the Emergency Room if pain becomes severe or symptoms worsen.

## 2024-09-06 NOTE — ED Notes (Signed)
 Pt asleep in hall Bed. Pt rr 14

## 2024-09-06 NOTE — ED Triage Notes (Addendum)
 Pt bib ems from home c/o left leg that is ongoing.   Pt had left leg surgery to place a stent in April and clot.  Pt says his right leg is hurting now d/t him putting more weight on it.  Denies numbness, tingling, and weakness  Pt states he is compliant with all his medication. Pt states his pain medicine is no longer working. Pt states he took a double dose of pain medicine last night that didn't work.   BP 124/70 HR 100 RA 100%

## 2024-09-06 NOTE — ED Provider Triage Note (Signed)
 Emergency Medicine Provider Triage Evaluation Note  Donald Palmer , a 63 y.o. male  was evaluated in triage.  Pt complains of bilateral lower extremity pain.  Patient has extensive past medical history of peripheral artery disease in his left leg, being followed by vascular/cardiology, patient has had multiple procedures and looks like patient has been to cardiology/vascular office multiple times since last procedure complaining of left leg pain and numbness.  Patient states that this is not acute left leg pain and that has been like this for some time.  He states that his right leg is in pain because he is putting more pressure on it trying to compensate for his left leg.  Denies fever, chills, chest pain, shortness of breath.  Patient has good range of motion of bilateral lower extremities. Recent ABI last month.   Review of Systems  Positive: Bilateral lower extremity pain Negative: Fever, chills, chest pain, shortness of breath, calf pain redness or swelling  Physical Exam  BP 113/70 (BP Location: Right Arm)   Pulse 86   Temp 97.7 F (36.5 C) (Oral)   Resp 15   Ht 5' 9 (1.753 m)   Wt 79.4 kg   SpO2 96%   BMI 25.84 kg/m  Gen:   Awake, no distress   Resp:  Normal effort  MSK:   Moves extremities without difficulty  Other:  Delayed capillary refill of left lower extremity, I was unable to find DP pulse with Doppler, I was able to find PT pulse with Doppler for a few seconds however was very difficult to find  Medical Decision Making  Medically screening exam initiated at 2:53 PM.  Appropriate orders placed.  AMERE BRICCO was informed that the remainder of the evaluation will be completed by another provider, this initial triage assessment does not replace that evaluation, and the importance of remaining in the ED until their evaluation is complete.  Orders: CBC, CMP   Janetta Terrall FALCON, [place] 09/06/24 1456

## 2024-09-06 NOTE — ED Provider Notes (Signed)
 [place] EMERGENCY DEPARTMENT AT [facility] Provider Note   CSN: [id number] Arrival date & time: 09/06/24  1407     Patient presents with: Leg Pain   Donald Palmer is a 63 y.o. male presents today for bilateral leg pain.  Patient had a stent placed in his left leg in April and the pain has been ongoing since.  Patient states that his right leg is now hurting due to him putting more weight on it to compensate for left leg pain.  Patient denies numbness, tingling, weakness, injury, paresthesias, shortness of breath, chest pain, or any other complaints at this time.  Patient reports this is the same pain he has had for several months and he just wants pain medication.  He states he is compliant with all his medication.  Patient most recently had bilateral ultrasound ABIs and lower extremity arterial studies ordered by Dr. Court in early August.    Leg Pain      Prior to Admission medications   Medication Sig Start Date End Date Taking? Authorizing Provider  Accu-Chek Softclix Lancets lancets check blood sugar 2 times a day 06/05/21   Sherlean Failing, MD  acetaminophen  (TYLENOL ) 500 MG tablet Take 1,000 mg by mouth every 6 (six) hours as needed for mild pain (pain score 1-3) or headache.    [provider]  atorvastatin  (LIPITOR ) 80 MG tablet Take 1 tablet (80 mg total) by mouth daily at 6 PM. 01/30/24   Gregary Sharper, MD  Blood Glucose Monitoring Suppl (ACCU-CHEK GUIDE) w/Device KIT Check blood sugar 2 times a day 06/05/21   Sherlean Failing, MD  cilostazol  (PLETAL ) 50 MG tablet Take 1 tablet (50 mg total) by mouth 2 (two) times daily. 07/28/24   Court Dorn PARAS, MD  clopidogrel  (PLAVIX ) 75 MG tablet Take 1 tablet (75 mg total) by mouth daily. 01/30/24   Gregary Sharper, MD  colchicine  0.6 MG tablet Take 1 tablet (0.6 mg total) by mouth daily as needed (gout flare). 01/30/24   Gregary Sharper, MD  DULoxetine  (CYMBALTA ) 30 MG capsule Take 1 capsule (30 mg total) by mouth  daily. 07/08/24   Tawkaliyar, Roya, DO  ELIQUIS  5 MG TABS tablet TAKE 1 TABLET(5 MG) BY MOUTH TWICE DAILY 04/28/24   Sheree Penne Bruckner, MD  famotidine  (PEPCID ) 20 MG tablet Take 20 mg by mouth 2 (two) times daily.    [provider]  glucose blood (ACCU-CHEK GUIDE TEST) test strip Use to test blood glucose 2 times daily. 08/13/24   Tobie Gaines, DO  Insulin  Pen Needle (PEN NEEDLES) 32G X 4 MM MISC Use to inject liraglutide  once a day 03/04/24   Harrie Bruckner, DO  losartan  (COZAAR ) 50 MG tablet Take 50 mg by mouth daily. Patient not taking: Reported on 07/28/2024    [provider]  metoprolol  tartrate (LOPRESSOR ) 25 MG tablet Take 1 tablet (25 mg total) by mouth 2 (two) times daily. 01/30/24   Gregary Sharper, MD  omeprazole  (PRILOSEC) 40 MG capsule Take 1 capsule (40 mg total) by mouth daily. 01/30/24   Gregary Sharper, MD  pioglitazone  (ACTOS ) 30 MG tablet Take 1 tablet (30 mg total) by mouth daily. 03/04/24 03/04/25  Harrie Bruckner, DO  pregabalin  (LYRICA ) 150 MG capsule Take 150 mg by mouth 2 (two) times daily.    [provider]  Semaglutide ,0.25 or 0.5MG /DOS, 2 MG/3ML SOPN Inject 0.25 mg into the skin once a week. 07/08/24   Tawkaliyar, Roya, DO  tadalafil  (CIALIS ) 10 MG tablet Take  1 tablet (10 mg total) by mouth as needed for erectile dysfunction. 01/30/24 01/29/25  Gregary Sharper, MD    Allergies: Metformin  and related    Review of Systems  Musculoskeletal:  Positive for arthralgias and myalgias.    Updated Vital Signs BP 130/68 (BP Location: Right Arm)   Pulse 76   Temp 98.1 F (36.7 C) (Oral)   Resp 16   Ht 5' 9 (1.753 m)   Wt 79.4 kg   SpO2 100%   BMI 25.84 kg/m   Physical Exam Vitals and nursing note reviewed.  Constitutional:      General: He is not in acute distress.    Appearance: He is well-developed.  HENT:     Head: Normocephalic and atraumatic.  Eyes:     Conjunctiva/sclera: Conjunctivae normal.  Cardiovascular:      Rate and Rhythm: Normal rate and regular rhythm.     Heart sounds: No murmur heard. Pulmonary:     Effort: Pulmonary effort is normal. No respiratory distress.     Breath sounds: Normal breath sounds.  Abdominal:     Palpations: Abdomen is soft.     Tenderness: There is no abdominal tenderness.  Musculoskeletal:        General: No swelling.     Cervical back: Neck supple.     Comments: Patient's left lower extremity is cooler than right which patient states it has been this way since his vascular surgery.  Patient is neurovascularly intact with weaker dorsalis pedis and posterior tibial pulses in the left lower extremity when compared to right.  Skin:    General: Skin is warm and dry.     Capillary Refill: Capillary refill takes less than 2 seconds.  Neurological:     Mental Status: He is alert.  Psychiatric:        Mood and Affect: Mood normal.     (all labs ordered are listed, but only abnormal results are displayed) Labs Reviewed  CBC WITH DIFFERENTIAL/PLATELET - Abnormal; Notable for the following components:      Result Value   Hemoglobin 11.2 (*)    HCT 36.7 (*)    MCV 71.4 (*)    MCH 21.8 (*)    RDW 16.7 (*)    All other components within normal limits  COMPREHENSIVE METABOLIC PANEL WITH GFR - Abnormal; Notable for the following components:   Glucose, Bld 230 (*)    Total Protein 6.4 (*)    Albumin  3.3 (*)    All other components within normal limits    EKG: None  Radiology: CT Angio Aortobifemoral W and/or Wo Contrast Result Date: 09/06/2024 CLINICAL DATA:  63 year old male with history of leg pain. EXAM: CT ANGIOGRAPHY OF ABDOMINAL AORTA WITH ILIOFEMORAL RUNOFF TECHNIQUE: Multidetector CT imaging of the abdomen, pelvis and lower extremities was performed using the standard protocol during bolus administration of intravenous contrast. Multiplanar CT image reconstructions and MIPs were obtained to evaluate the vascular anatomy. RADIATION DOSE REDUCTION: This exam  was performed according to the departmental dose-optimization program which includes automated exposure control, adjustment of the mA and/or kV according to patient size and/or use of iterative reconstruction technique. CONTRAST:  OMNIPAQUE  IOHEXOL  350 MG/ML SOLN COMPARISON:  03/17/2024 FINDINGS: VASCULAR Aorta: The distal aorta is patent and normal in caliber with circumferential atherosclerotic changes. RIGHT Lower Extremity Inflow: Common, internal and external iliac arteries are patent without evidence of aneurysm, dissection, vasculitis or significant stenosis. Scattered fibrofatty and calcific atherosclerotic changes without evidence of flow-limiting stenosis, similar  to comparison. Outflow: The common and profunda femoral arteries are patent. The superficial femoral artery is patent proximally with interval development of severe focal stenosis in its midportion associated with fibrofatty and calcific atherosclerotic plaque. The distal superficial femoral artery and popliteal arteries are patent. Runoff:Patent 2 vessel runoff to the level of the foot via the anterior and posterior tibial arteries. The peroneal artery is patent proximally with gradual tapering, likely congenital. LEFT Lower Extremity Inflow: Common, internal and external iliac arteries are patent without evidence of aneurysm, dissection, vasculitis or significant stenosis. Scattered fibrofatty and calcific atherosclerotic changes without evidence of flow-limiting stenosis, similar to comparison. Outflow: The common femoral artery is patent with scattered atherosclerotic changes. The profunda is patent and hypertrophied. Similar appearing, chronically occluded indwelling superficial femoral artery stent with distal reconstitution at the level of the popliteal artery. Runoff: Patent 2 vessel runoff to the level of the foot via the anterior and posterior tibial arteries. The peroneal artery is patent proximally with gradual tapering, likely  congenital. Veins: No obvious venous abnormality within the limitations of this arterial phase study. Review of the MIP images confirms the above findings. NON-VASCULAR Urinary Tract: The bladder is distended. No wall thickening. No evidence of hydroureter. Bowel: No evidence of bowel wall thickening, distention, or inflammatory changes. Lymphatic: No pelvic lymphadenopathy. Reproductive: Prostate is unremarkable. Other: No abdominal wall hernia or abnormality. No abdominopelvic ascites. Musculoskeletal: Similar appearing intramedullary sclerotic foci in the proximal bilateral tibial diaphyses. IMPRESSION: VASCULAR 1. Interval development of severe focal stenosis about the mid right superficial femoral artery secondary to atherosclerotic plaque. The right lower extremity inflow, outflow, and 2 vessel runoff to the level of the foot are otherwise patent. 2. Similar appearing chronic occlusion of left superficial femoral artery stent construct. The left inflow and 2 vessel runoff to the level of the foot are otherwise patent. 3.  Aortic Atherosclerosis (ICD10-I70.0). NON-VASCULAR No acute or significant pelvic abnormality. Ester Sides, MD [facility] Radiology Electronically Signed   By: Ester Sides M.D.   On: 09/06/2024 21:41     Procedures   Medications Ordered in the ED  HYDROcodone -acetaminophen  (NORCO/VICODIN) 5-325 MG per tablet 1 tablet (1 tablet Oral Given 09/06/24 1803)  iohexol  (OMNIPAQUE ) 350 MG/ML injection 100 mL (100 mLs Intravenous Contrast Given 09/06/24 1943)                                    Medical Decision Making Amount and/or Complexity of Data Reviewed Radiology: ordered.  Risk Prescription drug management.   This patient presents to the ED for concern of bilateral leg pain differential diagnosis includes peripheral artery disease, ischemia, musculoskeletal pain    Additional history obtained   Additional history  obtained from Electronic Medical Record External records from outside source obtained and reviewed including cardiology and vascular progress notes   Lab Tests:  I Ordered, and personally interpreted labs.  The pertinent results include: Anemia at 11.2 which is chronic per historical values, elevated glucose at 230   Imaging Studies ordered:  I ordered imaging studies including CT angio aortobifemoral with and without I independently visualized and interpreted imaging which showed interval development of severe focal stenosis about the mid right superficial femoral artery secondary to arthrosclerotic plaque.  The right lower extremity inflow, outflow and two-vessel runoff to the level of the foot are otherwise patent.  Similar appearing chronic occlusion of the left superficial femoral  artery stent construct.  The left inflow and two-vessel runoff to the level of the foot are otherwise patent. I agree with the radiologist interpretation   Medicines ordered and prescription drug management:  I ordered medication including Norco    I have reviewed the patients home medicines and have made adjustments as needed   Problem List / ED Course:  Consulted vascular surgery, Dr. Sheree who recommended patient follow-up in clinic or reach out through secure messaging to arrange procedure. Considered for admission or further workup however patient's vital signs, physical exam, labs, and imaging are reassuring.  Despite patient still endorsing pain patient has been sitting/sleeping comfortably in bed, watching sports, and/or eating without issue.  Patient to follow-up with vascular or cardiology for further evaluation and treatment.  Patient given return precautions.  I feel patient is safe for discharge at this time.       Final diagnoses:  Pain in both lower extremities    ED Discharge Orders     None          Francis Ileana LOISE DEVONNA 09/06/24 2238    Towana Ozell BROCKS, MD 09/07/24  1007

## 2024-09-06 NOTE — ED Notes (Signed)
 Patient transported to CT

## 2024-09-06 NOTE — ED Notes (Signed)
Pt verbalized understanding of discharge instructions. Pt wheeled from ed.

## 2024-09-09 ENCOUNTER — Ambulatory Visit: Admitting: Vascular Surgery

## 2024-09-09 ENCOUNTER — Encounter: Payer: Self-pay | Admitting: Vascular Surgery

## 2024-09-09 ENCOUNTER — Other Ambulatory Visit: Payer: Self-pay

## 2024-09-09 ENCOUNTER — Telehealth: Payer: Self-pay

## 2024-09-09 ENCOUNTER — Ambulatory Visit: Attending: Vascular Surgery | Admitting: Vascular Surgery

## 2024-09-09 VITALS — BP 128/86 | HR 97 | Temp 97.8°F | Ht 69.0 in | Wt 173.0 lb

## 2024-09-09 DIAGNOSIS — I70222 Atherosclerosis of native arteries of extremities with rest pain, left leg: Secondary | ICD-10-CM

## 2024-09-09 MED ORDER — OXYCODONE-ACETAMINOPHEN 5-325 MG PO TABS: 1.0000 | ORAL_TABLET | ORAL | 0 refills | Status: DC | PRN

## 2024-09-09 NOTE — H&P (View-Only) (Signed)
 [name], male   DOB: 10-06-1961, 63 y.o.   MRN: [id number]  Reason for Consult: Follow-up   Referred by Tobie Gaines, DO  Subjective:     HPI:  Donald Palmer is a 63 y.o. male history of left lower extremity claudication underwent endovascular intervention in 2019 and subsequent follow-up intervention in 2020.  Symptoms returned in 2024 we discussed possible bypass but ultimately underwent CABG.  He was then taken back and repeat stenting for rest pain in the left lower extremity after the first day post procedure had left foot and ankle pain which was considered reperfusion.  He was taking Eliquis  and Plavix  discontinued this for a trip to [place]  and subsequently re-thrombosed his stents.  These were lysed open in April of this year.  At his last visit with me he was having severe pain in the left leg with patent stents.  He now follows up with worsened pain found to have stents occluded in the ER.  He does not have any tissue loss or ulceration but states that his pain is unbearable he cannot sleep through the night.  He is currently walking with the help of a cane secondary to pain.  He continues to smoke.  He is taking Plavix , Eliquis  and statin as prescribed.  Past Medical History:  Diagnosis Date   Back pain, chronic    Coronary artery disease    Diabetes mellitus 2007   Hepatitis C    [id number]   History of chronic hepatitis C 05/13/2012   First noted 2013. RF inc tattoos obtained while incarcerated. [place]  2016 SVR 2017    Hypertension goal BP (blood pressure) < 140/80    Microcytic anemia    Neuromuscular disorder (HCC)    PAD (peripheral artery disease)    Pancreatitis 01/09/2012   Family History  Problem Relation Age of Onset   Diabetes Mother    Hypertension Mother    Hyperlipidemia Mother    Asthma Sister    Obesity Sister    Diabetes Brother    Diabetes Brother    Diabetes Daughter    Heart attack Neg Hx    Sudden death Neg Hx    Past Surgical  History:  Procedure Laterality Date   ABDOMINAL AORTOGRAM N/A 03/03/2018   Procedure: ABDOMINAL AORTOGRAM;  Surgeon: Court Dorn PARAS, MD;  [facility]: [facility];  Service: Cardiovascular;  Laterality: N/A;   ABDOMINAL AORTOGRAM W/LOWER EXTREMITY N/A 06/15/2019   Procedure: ABDOMINAL AORTOGRAM W/LOWER EXTREMITY;  Surgeon: Court Dorn PARAS, MD;  Location: [facility];  Service: Cardiovascular;  Laterality: N/A;   ABDOMINAL AORTOGRAM W/LOWER EXTREMITY N/A 09/09/2023   Procedure: ABDOMINAL AORTOGRAM W/LOWER EXTREMITY;  Surgeon: Sheree Penne Bruckner, MD;  Location: [facility] INVASIVE CV LAB;  Service: Cardiovascular;  Laterality: N/A;   ABDOMINAL AORTOGRAM W/LOWER EXTREMITY N/A 02/24/2024   Procedure: ABDOMINAL AORTOGRAM W/LOWER EXTREMITY;  Surgeon: Sheree Penne Bruckner, MD;  Location: [facility] INVASIVE CV LAB;  Service: Cardiovascular;  Laterality: N/A;   ABDOMINAL AORTOGRAM W/LOWER EXTREMITY N/A 03/18/2024   Procedure: ABDOMINAL AORTOGRAM W/LOWER EXTREMITY;  Surgeon: Lanis Fonda BRAVO, MD;  Location: [facility] INVASIVE CV LAB;  Service: Cardiovascular;  Laterality: N/A;   CORONARY ARTERY BYPASS GRAFT N/A 11/13/2023   Procedure: CORONARY ARTERY BYPASS GRAFTING X 3, USING LEFT INTERNAL MAMMARY ARTERY AND ENDOSCOPICALLY HARVESTED RIGHT SAPHENOUS VEIN GRAFT;  Surgeon: Shyrl Linnie KIDD, MD;  Location: [facility] OR;  Service: Open Heart Surgery;  Laterality: N/A;   I & D EXTREMITY  Right 05/15/2016   Procedure: RIGHT INDEX FINGER FIRST THROUGH  MIDDLE PHALYNX  AMPUTATION;  Surgeon: Alm Hummer, MD;  Location: [facility] OR;  Service: Orthopedics;  Laterality: Right;   LEFT HEART CATH AND CORONARY ANGIOGRAPHY N/A 10/16/2023   Procedure: LEFT HEART CATH AND CORONARY ANGIOGRAPHY;  Surgeon: Elmira Newman PARAS, MD;  [facility]: [facility];  Service: Cardiovascular;  Laterality: N/A;   LOWER EXTREMITY INTERVENTION Bilateral 03/03/2018   Procedure: LOWER EXTREMITY INTERVENTION;  Surgeon: Court Dorn PARAS, MD;   [facility]: [facility];  Service: Cardiovascular;  Laterality: Bilateral;   LOWER EXTREMITY INTERVENTION  02/24/2024   Procedure: LOWER EXTREMITY INTERVENTION;  Surgeon: Sheree Penne Bruckner, MD;  Location: [facility] INVASIVE CV LAB;  Service: Cardiovascular;;   LOWER EXTREMITY INTERVENTION  03/18/2024   Procedure: LOWER EXTREMITY INTERVENTION;  Surgeon: Lanis Fonda BRAVO, MD;  Location: [facility] INVASIVE CV LAB;  Service: Cardiovascular;;   OPEN REDUCTION INTERNAL FIXATION (ORIF) DISTAL PHALANX Right 04/02/2016   Procedure: RIGHT INDEX FINGER REPAIR;  Surgeon: Alm Hummer, MD;  Location: [facility];  Service: Orthopedics;  Laterality: Right;   PERIPHERAL VASCULAR ATHERECTOMY Left 03/03/2018   Procedure: PERIPHERAL VASCULAR ATHERECTOMY;  Surgeon: Court Dorn PARAS, MD;  Location: [facility] INVASIVE CV LAB;  Service: Cardiovascular;  Laterality: Left;  SFA WITH PTA DRUG COATED BALLOON   PERIPHERAL VASCULAR INTERVENTION Left 06/15/2019   Procedure: PERIPHERAL VASCULAR INTERVENTION;  Surgeon: Court Dorn PARAS, MD;  [facility];  Service: Cardiovascular;  Laterality: Left;   PERIPHERAL VASCULAR THROMBECTOMY N/A 03/19/2024   Procedure: PERIPHERAL VASCULAR THROMBECTOMY;  Surgeon: Gretta Bruckner PARAS, MD;  [facility];  Service: Cardiovascular;  Laterality: N/A;   TEE WITHOUT CARDIOVERSION N/A 11/13/2023   Procedure: TRANSESOPHAGEAL ECHOCARDIOGRAM;  Surgeon: Shyrl Linnie KIDD, MD;  Location: [facility] OR;  Service: Open Heart Surgery;  Laterality: N/A;    Short Social History:  Social History   Tobacco Use   Smoking status: Former    Current packs/day: 0.10    Average packs/day: 0.1 packs/day for 30.0 years (3.0 ttl pk-yrs)    Types: Cigarettes, Cigars   Smokeless tobacco: Never   Tobacco comments:    Per pt, 3 cigarettes per day, as of 11/11/23  Substance Use Topics   Alcohol  use: Not Currently    Alcohol /week: 5.0 standard drinks of alcohol     Types: 5 Cans  of beer per week    Comment: Beer.    Allergies  Allergen Reactions   Metformin  And Related Other (See Comments)    GI side effects. Stopped 2016    Current Outpatient Medications  Medication Sig Dispense Refill   Accu-Chek Softclix Lancets lancets check blood sugar 2 times a day 100 each 5   acetaminophen  (TYLENOL ) 500 MG tablet Take 1,000 mg by mouth every 6 (six) hours as needed for mild pain (pain score 1-3) or headache.     atorvastatin  (LIPITOR ) 80 MG tablet Take 1 tablet (80 mg total) by mouth daily at 6 PM. 90 tablet 3   Blood Glucose Monitoring Suppl (ACCU-CHEK GUIDE) w/Device KIT Check blood sugar 2 times a day 1 kit 1   cilostazol  (PLETAL ) 50 MG tablet Take 1 tablet (50 mg total) by mouth 2 (two) times daily. 180 tablet 1   clopidogrel  (PLAVIX ) 75 MG tablet Take 1 tablet (75 mg total) by mouth daily. 90 tablet 3   colchicine  0.6 MG tablet Take 1 tablet (0.6 mg total) by mouth daily as needed (gout flare). 30  tablet 2   DULoxetine  (CYMBALTA ) 30 MG capsule Take 1 capsule (30 mg total) by mouth daily. 30 capsule 2   ELIQUIS  5 MG TABS tablet TAKE 1 TABLET(5 MG) BY MOUTH TWICE DAILY 60 tablet 1   famotidine  (PEPCID ) 20 MG tablet Take 20 mg by mouth 2 (two) times daily.     glucose blood (ACCU-CHEK GUIDE TEST) test strip Use to test blood glucose 2 times daily. 100 each 12   Insulin  Pen Needle (PEN NEEDLES) 32G X 4 MM MISC Use to inject liraglutide  once a day 100 each 3   losartan  (COZAAR ) 50 MG tablet Take 50 mg by mouth daily.     metoprolol  tartrate (LOPRESSOR ) 25 MG tablet Take 1 tablet (25 mg total) by mouth 2 (two) times daily. 60 tablet 2   omeprazole  (PRILOSEC) 40 MG capsule Take 1 capsule (40 mg total) by mouth daily. 90 capsule 3   pioglitazone  (ACTOS ) 30 MG tablet Take 1 tablet (30 mg total) by mouth daily. 30 tablet 11   pregabalin  (LYRICA ) 150 MG capsule Take 150 mg by mouth 2 (two) times daily.     Semaglutide ,0.25 or 0.5MG /DOS, 2 MG/3ML SOPN Inject 0.25 mg into the  skin once a week. 3 mL 2   tadalafil  (CIALIS ) 10 MG tablet Take 1 tablet (10 mg total) by mouth as needed for erectile dysfunction. 20 tablet 1   No current facility-administered medications for this visit.    Review of Systems  Constitutional:  Constitutional negative. HENT: HENT negative.  Eyes: Eyes negative.  Cardiovascular: Cardiovascular negative.  GI: Gastrointestinal negative.  Musculoskeletal: Positive for leg pain.  Neurological: Neurological negative. Hematologic: Hematologic/lymphatic negative.  Psychiatric: Psychiatric negative.        Objective:  Objective   Vitals:   09/09/24 1515  BP: 128/86  Pulse: 97  Temp: 97.8 F (36.6 C)  SpO2: 98%  Weight: 173 lb (78.5 kg)  Height: 5' 9 (1.753 m)   Body mass index is 25.55 kg/m.  Physical Exam HENT:     Head: Normocephalic.     Nose: Nose normal.  Eyes:     Pupils: Pupils are equal, round, and reactive to light.  Cardiovascular:     Pulses:          Femoral pulses are 2+ on the right side and 1+ on the left side. Pulmonary:     Effort: Pulmonary effort is normal.  Abdominal:     General: Abdomen is flat.  Musculoskeletal:     Right lower leg: No edema.     Left lower leg: No edema.  Neurological:     General: No focal deficit present.     Mental Status: He is alert.     Data: CT IMPRESSION: VASCULAR   1. Interval development of severe focal stenosis about the mid right superficial femoral artery secondary to atherosclerotic plaque. The right lower extremity inflow, outflow, and 2 vessel runoff to the level of the foot are otherwise patent. 2. Similar appearing chronic occlusion of left superficial femoral artery stent construct. The left inflow and 2 vessel runoff to the level of the foot are otherwise patent. 3.  Aortic Atherosclerosis (ICD10-I70.0).       Assessment/Plan:     63 year old male with severe left lower extremity pain which may be multifactorial due to history of ischemia  with occlusion of his stents now with chronically occluded stents in need of bypass.  Will need angiography and can cannulate the left common femoral artery for left  lower extremity mapping and then bypass the next day.  We discussed that his pain is likely multifactorial and surgery unlikely to relieve all of his pain.  He needs continued walking and we discussed the desperate need for smoking cessation.  I have sent 15 pain pills to his pharmacy to hopefully allow him to sleep.  We discussed the risk benefits alternatives and expected outcomes of bypass surgery and he demonstrates good understanding in the presence of his wife.     Penne Lonni [place] MD [facility]

## 2024-09-09 NOTE — Progress Notes (Signed)
 [name], male   DOB: 10-06-1961, 63 y.o.   MRN: [id number]  Reason for Consult: Follow-up   Referred by Donald Gaines, DO  Subjective:     HPI:  Donald Palmer is a 63 y.o. male history of left lower extremity claudication underwent endovascular intervention in 2019 and subsequent follow-up intervention in 2020.  Symptoms returned in 2024 we discussed possible bypass but ultimately underwent CABG.  He was then taken back and repeat stenting for rest pain in the left lower extremity after the first day post procedure had left foot and ankle pain which was considered reperfusion.  He was taking Eliquis  and Plavix  discontinued this for a trip to [place]  and subsequently re-thrombosed his stents.  These were lysed open in April of this year.  At his last visit with me he was having severe pain in the left leg with patent stents.  He now follows up with worsened pain found to have stents occluded in the ER.  He does not have any tissue loss or ulceration but states that his pain is unbearable he cannot sleep through the night.  He is currently walking with the help of a cane secondary to pain.  He continues to smoke.  He is taking Plavix , Eliquis  and statin as prescribed.  Past Medical History:  Diagnosis Date   Back pain, chronic    Coronary artery disease    Diabetes mellitus 2007   Hepatitis C    [id number]   History of chronic hepatitis C 05/13/2012   First noted 2013. RF inc tattoos obtained while incarcerated. [place]  2016 SVR 2017    Hypertension goal BP (blood pressure) < 140/80    Microcytic anemia    Neuromuscular disorder (HCC)    PAD (peripheral artery disease)    Pancreatitis 01/09/2012   Family History  Problem Relation Age of Onset   Diabetes Mother    Hypertension Mother    Hyperlipidemia Mother    Asthma Sister    Obesity Sister    Diabetes Brother    Diabetes Brother    Diabetes Daughter    Heart attack Neg Hx    Sudden death Neg Hx    Past Surgical  History:  Procedure Laterality Date   ABDOMINAL AORTOGRAM N/A 03/03/2018   Procedure: ABDOMINAL AORTOGRAM;  Surgeon: Court Dorn PARAS, MD;  Location: [facility];  Service: Cardiovascular;  Laterality: N/A;   ABDOMINAL AORTOGRAM W/LOWER EXTREMITY N/A 06/15/2019   Procedure: ABDOMINAL AORTOGRAM W/LOWER EXTREMITY;  Surgeon: Court Dorn PARAS, MD;  [facility]: [facility];  Service: Cardiovascular;  Laterality: N/A;   ABDOMINAL AORTOGRAM W/LOWER EXTREMITY N/A 09/09/2023   Procedure: ABDOMINAL AORTOGRAM W/LOWER EXTREMITY;  Surgeon: Sheree Penne Bruckner, MD;  Location: [facility] INVASIVE CV LAB;  Service: Cardiovascular;  Laterality: N/A;   ABDOMINAL AORTOGRAM W/LOWER EXTREMITY N/A 02/24/2024   Procedure: ABDOMINAL AORTOGRAM W/LOWER EXTREMITY;  Surgeon: Sheree Penne Bruckner, MD;  Location: [facility] INVASIVE CV LAB;  Service: Cardiovascular;  Laterality: N/A;   ABDOMINAL AORTOGRAM W/LOWER EXTREMITY N/A 03/18/2024   Procedure: ABDOMINAL AORTOGRAM W/LOWER EXTREMITY;  Surgeon: Lanis Fonda BRAVO, MD;  Location: [facility] INVASIVE CV LAB;  Service: Cardiovascular;  Laterality: N/A;   CORONARY ARTERY BYPASS GRAFT N/A 11/13/2023   Procedure: CORONARY ARTERY BYPASS GRAFTING X 3, USING LEFT INTERNAL MAMMARY ARTERY AND ENDOSCOPICALLY HARVESTED RIGHT SAPHENOUS VEIN GRAFT;  Surgeon: Shyrl Linnie KIDD, MD;  Location: [facility];  Service: Open Heart Surgery;  Laterality: N/A;   I & D EXTREMITY  Right 05/15/2016   Procedure: RIGHT INDEX FINGER FIRST THROUGH  MIDDLE PHALYNX  AMPUTATION;  Surgeon: Alm Hummer, MD;  Location: [facility] OR;  Service: Orthopedics;  Laterality: Right;   LEFT HEART CATH AND CORONARY ANGIOGRAPHY N/A 10/16/2023   Procedure: LEFT HEART CATH AND CORONARY ANGIOGRAPHY;  Surgeon: Elmira Newman PARAS, MD;  Location: [facility];  Service: Cardiovascular;  Laterality: N/A;   LOWER EXTREMITY INTERVENTION Bilateral 03/03/2018   Procedure: LOWER EXTREMITY INTERVENTION;  Surgeon: Court Dorn PARAS, MD;   [facility]: [facility];  Service: Cardiovascular;  Laterality: Bilateral;   LOWER EXTREMITY INTERVENTION  02/24/2024   Procedure: LOWER EXTREMITY INTERVENTION;  Surgeon: Sheree Penne Bruckner, MD;  Location: [facility] INVASIVE CV LAB;  Service: Cardiovascular;;   LOWER EXTREMITY INTERVENTION  03/18/2024   Procedure: LOWER EXTREMITY INTERVENTION;  Surgeon: Lanis Fonda BRAVO, MD;  Location: [facility] INVASIVE CV LAB;  Service: Cardiovascular;;   OPEN REDUCTION INTERNAL FIXATION (ORIF) DISTAL PHALANX Right 04/02/2016   Procedure: RIGHT INDEX FINGER REPAIR;  Surgeon: Alm Hummer, MD;  Location: [facility];  Service: Orthopedics;  Laterality: Right;   PERIPHERAL VASCULAR ATHERECTOMY Left 03/03/2018   Procedure: PERIPHERAL VASCULAR ATHERECTOMY;  Surgeon: Court Dorn PARAS, MD;  Location: [facility] INVASIVE CV LAB;  Service: Cardiovascular;  Laterality: Left;  SFA WITH PTA DRUG COATED BALLOON   PERIPHERAL VASCULAR INTERVENTION Left 06/15/2019   Procedure: PERIPHERAL VASCULAR INTERVENTION;  Surgeon: Court Dorn PARAS, MD;  [facility];  Service: Cardiovascular;  Laterality: Left;   PERIPHERAL VASCULAR THROMBECTOMY N/A 03/19/2024   Procedure: PERIPHERAL VASCULAR THROMBECTOMY;  Surgeon: Gretta Bruckner PARAS, MD;  Location: [facility];  Service: Cardiovascular;  Laterality: N/A;   TEE WITHOUT CARDIOVERSION N/A 11/13/2023   Procedure: TRANSESOPHAGEAL ECHOCARDIOGRAM;  Surgeon: Shyrl Linnie KIDD, MD;  Location: [facility] OR;  Service: Open Heart Surgery;  Laterality: N/A;    Short Social History:  Social History   Tobacco Use   Smoking status: Former    Current packs/day: 0.10    Average packs/day: 0.1 packs/day for 30.0 years (3.0 ttl pk-yrs)    Types: Cigarettes, Cigars   Smokeless tobacco: Never   Tobacco comments:    Per pt, 3 cigarettes per day, as of 11/11/23  Substance Use Topics   Alcohol  use: Not Currently    Alcohol /week: 5.0 standard drinks of alcohol     Types: 5 Cans  of beer per week    Comment: Beer.    Allergies  Allergen Reactions   Metformin  And Related Other (See Comments)    GI side effects. Stopped 2016    Current Outpatient Medications  Medication Sig Dispense Refill   Accu-Chek Softclix Lancets lancets check blood sugar 2 times a day 100 each 5   acetaminophen  (TYLENOL ) 500 MG tablet Take 1,000 mg by mouth every 6 (six) hours as needed for mild pain (pain score 1-3) or headache.     atorvastatin  (LIPITOR ) 80 MG tablet Take 1 tablet (80 mg total) by mouth daily at 6 PM. 90 tablet 3   Blood Glucose Monitoring Suppl (ACCU-CHEK GUIDE) w/Device KIT Check blood sugar 2 times a day 1 kit 1   cilostazol  (PLETAL ) 50 MG tablet Take 1 tablet (50 mg total) by mouth 2 (two) times daily. 180 tablet 1   clopidogrel  (PLAVIX ) 75 MG tablet Take 1 tablet (75 mg total) by mouth daily. 90 tablet 3   colchicine  0.6 MG tablet Take 1 tablet (0.6 mg total) by mouth daily as needed (gout flare). 30  tablet 2   DULoxetine  (CYMBALTA ) 30 MG capsule Take 1 capsule (30 mg total) by mouth daily. 30 capsule 2   ELIQUIS  5 MG TABS tablet TAKE 1 TABLET(5 MG) BY MOUTH TWICE DAILY 60 tablet 1   famotidine  (PEPCID ) 20 MG tablet Take 20 mg by mouth 2 (two) times daily.     glucose blood (ACCU-CHEK GUIDE TEST) test strip Use to test blood glucose 2 times daily. 100 each 12   Insulin  Pen Needle (PEN NEEDLES) 32G X 4 MM MISC Use to inject liraglutide  once a day 100 each 3   losartan  (COZAAR ) 50 MG tablet Take 50 mg by mouth daily.     metoprolol  tartrate (LOPRESSOR ) 25 MG tablet Take 1 tablet (25 mg total) by mouth 2 (two) times daily. 60 tablet 2   omeprazole  (PRILOSEC) 40 MG capsule Take 1 capsule (40 mg total) by mouth daily. 90 capsule 3   pioglitazone  (ACTOS ) 30 MG tablet Take 1 tablet (30 mg total) by mouth daily. 30 tablet 11   pregabalin  (LYRICA ) 150 MG capsule Take 150 mg by mouth 2 (two) times daily.     Semaglutide ,0.25 or 0.5MG /DOS, 2 MG/3ML SOPN Inject 0.25 mg into the  skin once a week. 3 mL 2   tadalafil  (CIALIS ) 10 MG tablet Take 1 tablet (10 mg total) by mouth as needed for erectile dysfunction. 20 tablet 1   No current facility-administered medications for this visit.    Review of Systems  Constitutional:  Constitutional negative. HENT: HENT negative.  Eyes: Eyes negative.  Cardiovascular: Cardiovascular negative.  GI: Gastrointestinal negative.  Musculoskeletal: Positive for leg pain.  Neurological: Neurological negative. Hematologic: Hematologic/lymphatic negative.  Psychiatric: Psychiatric negative.        Objective:  Objective   Vitals:   09/09/24 1515  BP: 128/86  Pulse: 97  Temp: 97.8 F (36.6 C)  SpO2: 98%  Weight: 173 lb (78.5 kg)  Height: 5' 9 (1.753 m)   Body mass index is 25.55 kg/m.  Physical Exam HENT:     Head: Normocephalic.     Nose: Nose normal.  Eyes:     Pupils: Pupils are equal, round, and reactive to light.  Cardiovascular:     Pulses:          Femoral pulses are 2+ on the right side and 1+ on the left side. Pulmonary:     Effort: Pulmonary effort is normal.  Abdominal:     General: Abdomen is flat.  Musculoskeletal:     Right lower leg: No edema.     Left lower leg: No edema.  Neurological:     General: No focal deficit present.     Mental Status: He is alert.     Data: CT IMPRESSION: VASCULAR   1. Interval development of severe focal stenosis about the mid right superficial femoral artery secondary to atherosclerotic plaque. The right lower extremity inflow, outflow, and 2 vessel runoff to the level of the foot are otherwise patent. 2. Similar appearing chronic occlusion of left superficial femoral artery stent construct. The left inflow and 2 vessel runoff to the level of the foot are otherwise patent. 3.  Aortic Atherosclerosis (ICD10-I70.0).       Assessment/Plan:     63 year old male with severe left lower extremity pain which may be multifactorial due to history of ischemia  with occlusion of his stents now with chronically occluded stents in need of bypass.  Will need angiography and can cannulate the left common femoral artery for left  lower extremity mapping and then bypass the next day.  We discussed that his pain is likely multifactorial and surgery unlikely to relieve all of his pain.  He needs continued walking and we discussed the desperate need for smoking cessation.  I have sent 15 pain pills to his pharmacy to hopefully allow him to sleep.  We discussed the risk benefits alternatives and expected outcomes of bypass surgery and he demonstrates good understanding in the presence of his wife.     Penne Lonni [place] MD [facility]

## 2024-09-09 NOTE — Telephone Encounter (Signed)
 I called pt after he left, per front desk,  his wife was wanting to call to discuss pt's possible need for surgery. Pt answered and stated he is coming in today for an appt with MD. No further questions/concerns at this time.

## 2024-09-11 ENCOUNTER — Telehealth: Payer: Self-pay

## 2024-09-11 NOTE — Telephone Encounter (Signed)
 Copied from CRM [id number]. Topic: Clinical - Medical Advice >> Sep 11, 2024 12:54 PM Alfonso ORN wrote: Reason for CRM: pt asking provider can prescribe the prep liquid medication for colonoscopy,  reason pt will be having an upcoming surgery  on 09/15/24 on his leg and while under Anesthesia wanting the surgeon to the colonoscopy  test

## 2024-09-14 ENCOUNTER — Other Ambulatory Visit: Payer: Self-pay

## 2024-09-14 ENCOUNTER — Encounter (HOSPITAL_COMMUNITY): Admission: RE | Disposition: A | Payer: Self-pay | Source: Home / Self Care | Attending: Vascular Surgery

## 2024-09-14 ENCOUNTER — Inpatient Hospital Stay (HOSPITAL_COMMUNITY)
Admission: RE | Admit: 2024-09-14 | Discharge: 2024-09-17 | DRG: 253 | Disposition: A | Discharge status: Home / Self Care | Attending: Vascular Surgery | Admitting: Vascular Surgery

## 2024-09-14 ENCOUNTER — Telehealth: Payer: Self-pay | Admitting: *Deleted

## 2024-09-14 ENCOUNTER — Encounter (HOSPITAL_COMMUNITY): Payer: Self-pay | Admitting: Vascular Surgery

## 2024-09-14 DIAGNOSIS — Z23 Encounter for immunization: Secondary | ICD-10-CM | POA: Diagnosis present

## 2024-09-14 DIAGNOSIS — F17210 Nicotine dependence, cigarettes, uncomplicated: Secondary | ICD-10-CM | POA: Diagnosis present

## 2024-09-14 DIAGNOSIS — I10 Essential (primary) hypertension: Secondary | ICD-10-CM | POA: Diagnosis present

## 2024-09-14 DIAGNOSIS — Z7984 Long term (current) use of oral hypoglycemic drugs: Secondary | ICD-10-CM

## 2024-09-14 DIAGNOSIS — Z8249 Family history of ischemic heart disease and other diseases of the circulatory system: Secondary | ICD-10-CM | POA: Diagnosis not present

## 2024-09-14 DIAGNOSIS — I70222 Atherosclerosis of native arteries of extremities with rest pain, left leg: Secondary | ICD-10-CM | POA: Diagnosis present

## 2024-09-14 DIAGNOSIS — T82856A Stenosis of peripheral vascular stent, initial encounter: Principal | ICD-10-CM | POA: Diagnosis present

## 2024-09-14 DIAGNOSIS — Z7902 Long term (current) use of antithrombotics/antiplatelets: Secondary | ICD-10-CM | POA: Diagnosis not present

## 2024-09-14 DIAGNOSIS — Z833 Family history of diabetes mellitus: Secondary | ICD-10-CM | POA: Diagnosis not present

## 2024-09-14 DIAGNOSIS — Z7985 Long-term (current) use of injectable non-insulin antidiabetic drugs: Secondary | ICD-10-CM | POA: Diagnosis not present

## 2024-09-14 DIAGNOSIS — Z951 Presence of aortocoronary bypass graft: Secondary | ICD-10-CM

## 2024-09-14 DIAGNOSIS — Z7901 Long term (current) use of anticoagulants: Secondary | ICD-10-CM | POA: Diagnosis not present

## 2024-09-14 DIAGNOSIS — I700 Atherosclerosis of aorta: Principal | ICD-10-CM

## 2024-09-14 DIAGNOSIS — Y838 Other surgical procedures as the cause of abnormal reaction of the patient, or of later complication, without mention of misadventure at the time of the procedure: Secondary | ICD-10-CM | POA: Diagnosis present

## 2024-09-14 DIAGNOSIS — I739 Peripheral vascular disease, unspecified: Secondary | ICD-10-CM | POA: Diagnosis present

## 2024-09-14 DIAGNOSIS — Z83438 Family history of other disorder of lipoprotein metabolism and other lipidemia: Secondary | ICD-10-CM | POA: Diagnosis not present

## 2024-09-14 DIAGNOSIS — I70201 Unspecified atherosclerosis of native arteries of extremities, right leg: Secondary | ICD-10-CM | POA: Diagnosis not present

## 2024-09-14 DIAGNOSIS — E1151 Type 2 diabetes mellitus with diabetic peripheral angiopathy without gangrene: Secondary | ICD-10-CM | POA: Diagnosis present

## 2024-09-14 DIAGNOSIS — Z825 Family history of asthma and other chronic lower respiratory diseases: Secondary | ICD-10-CM | POA: Diagnosis not present

## 2024-09-14 DIAGNOSIS — Z720 Tobacco use: Secondary | ICD-10-CM | POA: Diagnosis not present

## 2024-09-14 DIAGNOSIS — T82898A Other specified complication of vascular prosthetic devices, implants and grafts, initial encounter: Secondary | ICD-10-CM | POA: Diagnosis not present

## 2024-09-14 DIAGNOSIS — I2510 Atherosclerotic heart disease of native coronary artery without angina pectoris: Secondary | ICD-10-CM | POA: Diagnosis present

## 2024-09-14 DIAGNOSIS — Z79899 Other long term (current) drug therapy: Secondary | ICD-10-CM

## 2024-09-14 HISTORY — PX: LOWER EXTREMITY ANGIOGRAPHY: CATH118251

## 2024-09-14 HISTORY — PX: ABDOMINAL AORTOGRAM: CATH118222

## 2024-09-14 LAB — CREATININE, SERUM
Creatinine, Ser: 0.97 mg/dL (ref 0.61–1.24)
GFR, Estimated: >60 mL/min (ref >60)

## 2024-09-14 LAB — COMPREHENSIVE METABOLIC PANEL WITH GFR
ALT: 16 U/L (ref 0–44)
AST: 24 U/L (ref 15–41)
Albumin: 3.3 g/dL — ABNORMAL LOW (ref 3.5–5.0)
Alkaline Phosphatase: 84 U/L (ref 38–126)
Anion gap: 11 (ref 5–15)
BUN: 8 mg/dL (ref 8–23)
CO2: 26 mmol/L (ref 22–32)
Calcium: 9.2 mg/dL (ref 8.9–10.3)
Chloride: 100 mmol/L (ref 98–111)
Creatinine, Ser: 0.95 mg/dL (ref 0.61–1.24)
GFR, Estimated: >60 mL/min (ref >60)
Glucose, Bld: 174 mg/dL — ABNORMAL HIGH (ref 70–99)
Potassium: 4 mmol/L (ref 3.5–5.1)
Sodium: 137 mmol/L (ref 135–145)
Total Bilirubin: 0.5 mg/dL (ref 0.0–1.2)
Total Protein: 6.7 g/dL (ref 6.5–8.1)

## 2024-09-14 LAB — CBC
HCT: 40.5 % (ref 39.0–52.0)
HCT: 40.6 % (ref 39.0–52.0)
Hemoglobin: 12.3 g/dL — ABNORMAL LOW (ref 13.0–17.0)
Hemoglobin: 12.4 g/dL — ABNORMAL LOW (ref 13.0–17.0)
MCH: 21.5 pg — ABNORMAL LOW (ref 26.0–34.0)
MCH: 21.4 pg — ABNORMAL LOW (ref 26.0–34.0)
MCHC: 30.4 g/dL (ref 30.0–36.0)
MCHC: 30.5 g/dL (ref 30.0–36.0)
MCV: 70.7 fL — ABNORMAL LOW (ref 80.0–100.0)
MCV: 70 fL — ABNORMAL LOW (ref 80.0–100.0)
Platelets: 286 K/uL (ref 150–400)
Platelets: 307 K/uL (ref 150–400)
RBC: 5.73 MIL/uL (ref 4.22–5.81)
RBC: 5.8 MIL/uL (ref 4.22–5.81)
RDW: 16.4 % — ABNORMAL HIGH (ref 11.5–15.5)
RDW: 16.4 % — ABNORMAL HIGH (ref 11.5–15.5)
WBC: 6.4 K/uL (ref 4.0–10.5)
WBC: 6.6 K/uL (ref 4.0–10.5)
nRBC: 0 % (ref 0.0–0.2)
nRBC: 0 % (ref 0.0–0.2)

## 2024-09-14 LAB — SURGICAL PCR SCREEN
MRSA, PCR: NEGATIVE
Staphylococcus aureus: POSITIVE — AB

## 2024-09-14 LAB — POCT I-STAT, CHEM 8
BUN: 9 mg/dL (ref 8–23)
Calcium, Ion: 1.29 mmol/L (ref 1.15–1.40)
Chloride: 102 mmol/L (ref 98–111)
Creatinine, Ser: 1 mg/dL (ref 0.61–1.24)
Glucose, Bld: 111 mg/dL — ABNORMAL HIGH (ref 70–99)
HCT: 43 % (ref 39.0–52.0)
Hemoglobin: 14.6 g/dL (ref 13.0–17.0)
Potassium: 4.4 mmol/L (ref 3.5–5.1)
Sodium: 142 mmol/L (ref 135–145)
TCO2: 28 mmol/L (ref 22–32)

## 2024-09-14 LAB — GLUCOSE, CAPILLARY
Glucose-Capillary: 99 mg/dL (ref 70–99)
Glucose-Capillary: 141 mg/dL — ABNORMAL HIGH (ref 70–99)
Glucose-Capillary: 184 mg/dL — ABNORMAL HIGH (ref 70–99)

## 2024-09-14 LAB — PROTIME-INR
INR: 1 (ref 0.8–1.2)
Prothrombin Time: 13.7 s (ref 11.4–15.2)

## 2024-09-14 LAB — TYPE AND SCREEN
ABO/RH(D): O POS
Antibody Screen: NEGATIVE

## 2024-09-14 LAB — APTT: aPTT: 32 s (ref 24–36)

## 2024-09-14 MED ORDER — INSULIN ASPART 100 UNIT/ML IJ SOLN
0.0000 [IU] | Freq: Three times a day (TID) | INTRAMUSCULAR | Status: DC
  Administered 2024-09-16 (×2): 3 [IU] via SUBCUTANEOUS
  Administered 2024-09-16: 8 [IU] via SUBCUTANEOUS
  Administered 2024-09-17: 5 [IU] via SUBCUTANEOUS
  Administered 2024-09-17: 3 [IU] via SUBCUTANEOUS

## 2024-09-14 MED ORDER — CILOSTAZOL 50 MG PO TABS
50.0000 mg | ORAL_TABLET | Freq: Two times a day (BID) | ORAL | Status: DC
  Administered 2024-09-14 – 2024-09-17 (×5): 50 mg via ORAL
  Filled 2024-09-14 (×8): qty 1

## 2024-09-14 MED ORDER — CEFAZOLIN SODIUM-DEXTROSE 2-4 GM/100ML-% IV SOLN
2.0000 g | INTRAVENOUS | Status: AC
  Administered 2024-09-15: 2 g via INTRAVENOUS
  Filled 2024-09-14 (×2): qty 100

## 2024-09-14 MED ORDER — ATORVASTATIN CALCIUM 80 MG PO TABS
80.0000 mg | ORAL_TABLET | Freq: Every day | ORAL | Status: DC
  Administered 2024-09-14 – 2024-09-15 (×2): 80 mg via ORAL
  Filled 2024-09-14 (×3): qty 1

## 2024-09-14 MED ORDER — SODIUM CHLORIDE 0.9 % IV SOLN: INTRAVENOUS | Status: DC

## 2024-09-14 MED ORDER — HYDRALAZINE HCL 20 MG/ML IJ SOLN: 5.0000 mg | INTRAMUSCULAR | Status: DC | PRN

## 2024-09-14 MED ORDER — LABETALOL HCL 5 MG/ML IV SOLN: 10.0000 mg | INTRAVENOUS | Status: DC | PRN

## 2024-09-14 MED ORDER — DULOXETINE HCL 30 MG PO CPEP
30.0000 mg | ORAL_CAPSULE | Freq: Every day | ORAL | Status: DC
  Administered 2024-09-14 – 2024-09-17 (×3): 30 mg via ORAL
  Filled 2024-09-14 (×3): qty 1

## 2024-09-14 MED ORDER — PREGABALIN 75 MG PO CAPS
150.0000 mg | ORAL_CAPSULE | Freq: Two times a day (BID) | ORAL | Status: DC
Start: 2024-09-14 — End: 2024-09-17
  Administered 2024-09-14 – 2024-09-17 (×5): 150 mg via ORAL
  Filled 2024-09-14 (×6): qty 2

## 2024-09-14 MED ORDER — METOPROLOL TARTRATE 25 MG PO TABS
25.0000 mg | ORAL_TABLET | Freq: Two times a day (BID) | ORAL | Status: DC
  Administered 2024-09-14 – 2024-09-17 (×6): 25 mg via ORAL
  Filled 2024-09-14 (×7): qty 1

## 2024-09-14 MED ORDER — OXYCODONE HCL 5 MG PO TABS
5.0000 mg | ORAL_TABLET | ORAL | Status: DC | PRN
  Administered 2024-09-14 – 2024-09-17 (×7): 10 mg via ORAL
  Filled 2024-09-14 (×7): qty 2

## 2024-09-14 MED ORDER — ONDANSETRON HCL 4 MG/2ML IJ SOLN
4.0000 mg | Freq: Four times a day (QID) | INTRAMUSCULAR | Status: DC | PRN
  Administered 2024-09-16: 4 mg via INTRAVENOUS
  Filled 2024-09-14: qty 2

## 2024-09-14 MED ORDER — SODIUM CHLORIDE 0.9 % IV SOLN: 250.0000 mL | INTRAVENOUS | Status: AC | PRN

## 2024-09-14 MED ORDER — CHLORHEXIDINE GLUCONATE CLOTH 2 % EX PADS
6.0000 | MEDICATED_PAD | Freq: Once | CUTANEOUS | Status: AC
Start: 2024-09-14 — End: 2024-09-14
  Administered 2024-09-14: 6 via TOPICAL

## 2024-09-14 MED ORDER — ENSURE PLUS HIGH PROTEIN PO LIQD
237.0000 mL | Freq: Two times a day (BID) | ORAL | Status: DC
  Administered 2024-09-16 – 2024-09-17 (×4): 237 mL via ORAL

## 2024-09-14 MED ORDER — IODIXANOL 320 MG/ML IV SOLN
INTRAVENOUS | Status: DC | PRN
  Administered 2024-09-14: 80 mL via INTRA_ARTERIAL

## 2024-09-14 MED ORDER — CLOPIDOGREL BISULFATE 75 MG PO TABS
75.0000 mg | ORAL_TABLET | Freq: Every day | ORAL | Status: DC
  Administered 2024-09-14 – 2024-09-17 (×3): 75 mg via ORAL
  Filled 2024-09-14 (×3): qty 1

## 2024-09-14 MED ORDER — MIDAZOLAM HCL 2 MG/2ML IJ SOLN
INTRAMUSCULAR | Status: AC
  Filled 2024-09-14: qty 2

## 2024-09-14 MED ORDER — ACETAMINOPHEN 325 MG PO TABS
650.0000 mg | ORAL_TABLET | ORAL | Status: DC | PRN
  Administered 2024-09-17: 650 mg via ORAL
  Filled 2024-09-14: qty 2

## 2024-09-14 MED ORDER — HEPARIN (PORCINE) IN NACL 1000-0.9 UT/500ML-% IV SOLN
INTRAVENOUS | Status: DC | PRN
  Administered 2024-09-14 (×2): 500 mL

## 2024-09-14 MED ORDER — LIDOCAINE HCL (PF) 1 % IJ SOLN
INTRAMUSCULAR | Status: DC | PRN
  Administered 2024-09-14: 10 mL

## 2024-09-14 MED ORDER — LIDOCAINE HCL (PF) 1 % IJ SOLN
INTRAMUSCULAR | Status: AC
  Filled 2024-09-14: qty 30

## 2024-09-14 MED ORDER — LOSARTAN POTASSIUM 50 MG PO TABS
50.0000 mg | ORAL_TABLET | Freq: Every day | ORAL | Status: DC
  Administered 2024-09-14 – 2024-09-17 (×3): 50 mg via ORAL
  Filled 2024-09-14 (×3): qty 1

## 2024-09-14 MED ORDER — INSULIN ASPART 100 UNIT/ML IJ SOLN
4.0000 [IU] | Freq: Three times a day (TID) | INTRAMUSCULAR | Status: DC
Start: 2024-09-14 — End: 2024-09-17
  Administered 2024-09-16 – 2024-09-17 (×3): 4 [IU] via SUBCUTANEOUS

## 2024-09-14 MED ORDER — HEPARIN SODIUM (PORCINE) 5000 UNIT/ML IJ SOLN
5000.0000 [IU] | Freq: Three times a day (TID) | INTRAMUSCULAR | Status: DC
Start: 2024-09-14 — End: 2024-09-17
  Administered 2024-09-14 – 2024-09-17 (×8): 5000 [IU] via SUBCUTANEOUS
  Filled 2024-09-14 (×8): qty 1

## 2024-09-14 MED ORDER — INSULIN ASPART 100 UNIT/ML IJ SOLN
0.0000 [IU] | Freq: Every day | INTRAMUSCULAR | Status: DC
  Administered 2024-09-15: 2 [IU] via SUBCUTANEOUS
  Administered 2024-09-16: 3 [IU] via SUBCUTANEOUS

## 2024-09-14 MED ORDER — HYDROMORPHONE HCL 1 MG/ML IJ SOLN
0.5000 mg | INTRAMUSCULAR | Status: DC | PRN
  Administered 2024-09-14 – 2024-09-15 (×3): 0.5 mg via INTRAVENOUS
  Administered 2024-09-17: 1 mg via INTRAVENOUS
  Filled 2024-09-14 (×2): qty 0.5
  Filled 2024-09-14 (×2): qty 1

## 2024-09-14 MED ORDER — SODIUM CHLORIDE 0.9% FLUSH: 3.0000 mL | INTRAVENOUS | Status: DC | PRN

## 2024-09-14 MED ORDER — CHLORHEXIDINE GLUCONATE CLOTH 2 % EX PADS
6.0000 | MEDICATED_PAD | Freq: Once | CUTANEOUS | Status: AC
  Administered 2024-09-15: 6 via TOPICAL

## 2024-09-14 MED ORDER — SODIUM CHLORIDE 0.9% FLUSH
3.0000 mL | Freq: Two times a day (BID) | INTRAVENOUS | Status: DC
  Administered 2024-09-14 – 2024-09-17 (×6): 3 mL via INTRAVENOUS

## 2024-09-14 MED ORDER — SODIUM CHLORIDE 0.9 % IV SOLN
INTRAVENOUS | Status: DC
Start: 2024-09-14 — End: 2024-09-14

## 2024-09-14 MED ORDER — FENTANYL CITRATE (PF) 100 MCG/2ML IJ SOLN
INTRAMUSCULAR | Status: DC | PRN
  Administered 2024-09-14: 50 ug via INTRAVENOUS

## 2024-09-14 MED ORDER — SODIUM CHLORIDE 0.9 % IV SOLN: INTRAVENOUS | Status: AC

## 2024-09-14 MED ORDER — FENTANYL CITRATE (PF) 100 MCG/2ML IJ SOLN
INTRAMUSCULAR | Status: AC
  Filled 2024-09-14: qty 2

## 2024-09-14 MED ORDER — INFLUENZA VIRUS VACC SPLIT PF (FLUZONE) 0.5 ML IM SUSY
0.5000 mL | PREFILLED_SYRINGE | INTRAMUSCULAR | Status: AC
  Administered 2024-09-17: 0.5 mL via INTRAMUSCULAR
  Filled 2024-09-14: qty 0.5

## 2024-09-14 MED ORDER — MIDAZOLAM HCL 2 MG/2ML IJ SOLN
INTRAMUSCULAR | Status: DC | PRN
  Administered 2024-09-14: 1 mg via INTRAVENOUS

## 2024-09-14 NOTE — Op Note (Signed)
    Patient name: Donald Palmer MRN: [id number] DOB: Apr 26, 1961 Sex: male  09/14/2024 Pre-operative Diagnosis: Left lower extremity atherosclerosis native arteries with occluded left SFA stents and rest pain Post-operative diagnosis:  Same Surgeon:  Penne BROCKS. Sheree, MD Procedure Performed: 1.  Percutaneous ultrasound-guided cannulation left common femoral artery 2.  Catheter selection of aorta and aortogram with bilateral lower extremity angiography 3.  Catheter selection right common femoral artery 4.  Moderate sedation with fentanyl  and Versed  for 15 minutes  Indications: 63 year old male with history of stenting of his left SFA.  At the time of initial stenting we had planned for bypass surgery but patient required coronary artery bypass grafting and subsequent underwent stenting.  The stents occluded he required repeat intervention.  He has had persistent left lower extremity pain which is likely multifactorial but does have known occlusion of the stents.  He is walking with the help of a cane and remains anticoagulated with Eliquis  and also on Plavix .  He does continue to smoke and we have discussed smoking cessation.  Plan is for angiography to plan future bypass surgery on the left.  Findings: Aorta bilateral renal arteries were patent.  Bilateral common and external neck arteries are heavily calcified although without flow-limiting stenosis.  Hypogastric arteries bilaterally are patent.  On the right side he does have a calcified common femoral artery leading into an SFA which is calcified with 90% stenosis in the midsegment and then reconstitutes nondiseased at the knee and has three-vessel runoff although diminutive at the foot.  On the left side the common femoral artery is heavily diseased appeared to be at least 90% stenosis in the common femoral artery we are able to cannulate above this area.  The first stent is initially patent and the knee is occluded until the above-knee area.  There is  approximately 30% stenosis behind the knee and he has three-vessel runoff after that.  Plan is for left common femoral endarterectomy and bypass with PTFE to the above-knee or possibly vein to the below-knee although previous vein mapping did not demonstrate any suitable vein for bypass surgery.  He will need urgent smoking cessation.   Procedure:  The patient was identified in the holding area and taken to room 8.  The patient was then placed supine on the table and prepped and draped in the usual sterile fashion.  A time out was called.  Ultrasound was used to evaluate the left common femoral artery.  This was heavily calcified appeared to have a tight stenosis we are able to anesthetize the skin above the stenosis and cannulated the artery with a needle followed by wire and sheath.  An ultrasound images are saved the permanent record.  We placed a Bentson wire which did pass easily through the iliac arteries into the aorta and then placed an Omni catheter to the level of L1 performed aortogram followed by pelvic angiography.  We then crossed the bifurcation with Bentson wire and an Omni catheter perform right lower extremity angiography.  We did not have plans for intervention given that he is to undergo bypass on the left.  We then removed the catheter over wire and perform left lower extremity angiography.  With the above findings the procedure was terminated he tolerated well.  Sheath will be pulled in postoperative holding.  Contrast: 80 cc   Xerxes Agrusa C. Sheree, MD [facility] [place] Office: [phone] Pager: [phone]

## 2024-09-14 NOTE — Telephone Encounter (Signed)
 Copied from CRM [id number]. Topic: Clinical - Medical Advice >> Sep 11, 2024  1:05 PM Alfonso ORN wrote: Reason for CRM: pt. Stated he is in a lot of pain due to issue with his leg . Agent offer nurse triage and pt refused stated he already have pain medication and his surgery will be on the 08/3024

## 2024-09-14 NOTE — Progress Notes (Signed)
 Site area: L fem artery 5 french sheath  Site Prior to Removal:  Level 0 Pressure Applied For: 25 mins Manual:   yes Patient Status During Pull:  stable Post Pull Site:  Level 0 Post Pull Instructions Given:  yes with teach back  Post Pull Pulses Present: doppler PT Dressing Applied:  yes Bedrest begins @ 1200

## 2024-09-14 NOTE — Progress Notes (Signed)
 Patient had a procedure 07/14/24 and has been admitted to hospital in room 6E22C-01.  Patient is scheduled for surgery tomorrow, 09/15/24 at 12:42 PM for a femoral bypass graft   PCP - Libby Blanch, DO Cardiologist - Dr Dorn Lesches  Chest x-ray - 01/09/24 EKG - 12/02/23 Stress Test - 10/07/23 ECHO TEE - 11/13/23 Cardiac Cath - 10/16/23  ICD Pacemaker/Loop - n/a  Sleep Study -  n/a

## 2024-09-14 NOTE — Telephone Encounter (Signed)
 Called pt - call cannot be completed. Reviewed chart, pt is currently in the hospital.

## 2024-09-14 NOTE — Interval H&P Note (Signed)
 History and Physical Interval Note:  09/14/2024 8:03 AM  Donald Palmer  has presented today for surgery, with the diagnosis of Stenosis of L femoral stent.  The various methods of treatment have been discussed with the patient and family. After consideration of risks, benefits and other options for treatment, the patient has consented to  Procedure(s): ABDOMINAL AORTOGRAM (N/A) Lower Extremity Angiography (N/A) as a surgical intervention.  The patient's history has been reviewed, patient examined, no change in status, stable for surgery.  I have reviewed the patient's chart and labs.  Questions were answered to the patient's satisfaction.     [name]

## 2024-09-15 ENCOUNTER — Inpatient Hospital Stay (HOSPITAL_COMMUNITY): Admission: RE | Admit: 2024-09-15 | Source: Home / Self Care | Admitting: Vascular Surgery

## 2024-09-15 ENCOUNTER — Encounter (HOSPITAL_COMMUNITY): Payer: Self-pay | Admitting: Vascular Surgery

## 2024-09-15 ENCOUNTER — Inpatient Hospital Stay (HOSPITAL_COMMUNITY): Admitting: Certified Registered Nurse Anesthetist

## 2024-09-15 ENCOUNTER — Other Ambulatory Visit: Payer: Self-pay

## 2024-09-15 ENCOUNTER — Encounter (HOSPITAL_COMMUNITY)
Admission: RE | Disposition: A | Discharge status: Home / Self Care | Payer: Self-pay | Source: Home / Self Care | Attending: Vascular Surgery

## 2024-09-15 DIAGNOSIS — T82856A Stenosis of peripheral vascular stent, initial encounter: Secondary | ICD-10-CM

## 2024-09-15 DIAGNOSIS — T82898A Other specified complication of vascular prosthetic devices, implants and grafts, initial encounter: Secondary | ICD-10-CM | POA: Diagnosis not present

## 2024-09-15 DIAGNOSIS — I70222 Atherosclerosis of native arteries of extremities with rest pain, left leg: Secondary | ICD-10-CM | POA: Diagnosis not present

## 2024-09-15 HISTORY — PX: PATCH ANGIOPLASTY: SHX6230

## 2024-09-15 HISTORY — PX: FEMORAL-POPLITEAL BYPASS GRAFT: SHX937

## 2024-09-15 HISTORY — DX: Male erectile dysfunction, unspecified: N52.9

## 2024-09-15 HISTORY — PX: ENDARTERECTOMY FEMORAL: SHX5804

## 2024-09-15 LAB — URINALYSIS, ROUTINE W REFLEX MICROSCOPIC
Bilirubin Urine: NEGATIVE
Glucose, UA: NEGATIVE mg/dL
Hgb urine dipstick: NEGATIVE
Ketones, ur: NEGATIVE mg/dL
Leukocytes,Ua: NEGATIVE
Nitrite: NEGATIVE
Protein, ur: NEGATIVE mg/dL
Specific Gravity, Urine: 1.01 (ref 1.005–1.030)
pH: 6 (ref 5.0–8.0)

## 2024-09-15 LAB — CBC
HCT: 39.4 % (ref 39.0–52.0)
HCT: 40.2 % (ref 39.0–52.0)
Hemoglobin: 12.1 g/dL — ABNORMAL LOW (ref 13.0–17.0)
Hemoglobin: 12.3 g/dL — ABNORMAL LOW (ref 13.0–17.0)
MCH: 21.5 pg — ABNORMAL LOW (ref 26.0–34.0)
MCH: 21.7 pg — ABNORMAL LOW (ref 26.0–34.0)
MCHC: 30.7 g/dL (ref 30.0–36.0)
MCHC: 30.6 g/dL (ref 30.0–36.0)
MCV: 70.1 fL — ABNORMAL LOW (ref 80.0–100.0)
MCV: 70.9 fL — ABNORMAL LOW (ref 80.0–100.0)
Platelets: 287 K/uL (ref 150–400)
Platelets: 260 K/uL (ref 150–400)
RBC: 5.62 MIL/uL (ref 4.22–5.81)
RBC: 5.67 MIL/uL (ref 4.22–5.81)
RDW: 16.5 % — ABNORMAL HIGH (ref 11.5–15.5)
RDW: 16.8 % — ABNORMAL HIGH (ref 11.5–15.5)
WBC: 6.4 K/uL (ref 4.0–10.5)
WBC: 10.5 K/uL (ref 4.0–10.5)
nRBC: 0 % (ref 0.0–0.2)
nRBC: 0 % (ref 0.0–0.2)

## 2024-09-15 LAB — BASIC METABOLIC PANEL WITH GFR
Anion gap: 7 (ref 5–15)
BUN: 7 mg/dL — ABNORMAL LOW (ref 8–23)
CO2: 26 mmol/L (ref 22–32)
Calcium: 9 mg/dL (ref 8.9–10.3)
Chloride: 100 mmol/L (ref 98–111)
Creatinine, Ser: 1.03 mg/dL (ref 0.61–1.24)
GFR, Estimated: >60 mL/min (ref >60)
Glucose, Bld: 115 mg/dL — ABNORMAL HIGH (ref 70–99)
Potassium: 4 mmol/L (ref 3.5–5.1)
Sodium: 133 mmol/L — ABNORMAL LOW (ref 135–145)

## 2024-09-15 LAB — POCT I-STAT 7, (LYTES, BLD GAS, ICA,H+H)
Acid-base deficit: 1 mmol/L (ref 0.0–2.0)
Bicarbonate: 23.5 mmol/L (ref 20.0–28.0)
Calcium, Ion: 1.2 mmol/L (ref 1.15–1.40)
HCT: 37 % — ABNORMAL LOW (ref 39.0–52.0)
Hemoglobin: 12.6 g/dL — ABNORMAL LOW (ref 13.0–17.0)
O2 Saturation: 100 %
Potassium: 4 mmol/L (ref 3.5–5.1)
Sodium: 135 mmol/L (ref 135–145)
TCO2: 25 mmol/L (ref 22–32)
pCO2 arterial: 39 mmHg (ref 32–48)
pH, Arterial: 7.389 (ref 7.35–7.45)
pO2, Arterial: 185 mmHg — ABNORMAL HIGH (ref 83–108)

## 2024-09-15 LAB — LIPID PANEL
Cholesterol: 138 mg/dL (ref 0–200)
HDL: 39 mg/dL — ABNORMAL LOW (ref >40)
LDL Cholesterol: 77 mg/dL (ref 0–99)
Total CHOL/HDL Ratio: 3.5 ratio
Triglycerides: 109 mg/dL (ref <150)
VLDL: 22 mg/dL (ref 0–40)

## 2024-09-15 LAB — POCT ACTIVATED CLOTTING TIME
Activated Clotting Time: 239 s
Activated Clotting Time: 296 s
Activated Clotting Time: 216 s

## 2024-09-15 LAB — GLUCOSE, CAPILLARY
Glucose-Capillary: 108 mg/dL — ABNORMAL HIGH (ref 70–99)
Glucose-Capillary: 134 mg/dL — ABNORMAL HIGH (ref 70–99)
Glucose-Capillary: 130 mg/dL — ABNORMAL HIGH (ref 70–99)
Glucose-Capillary: 238 mg/dL — ABNORMAL HIGH (ref 70–99)

## 2024-09-15 LAB — CREATININE, SERUM
Creatinine, Ser: 1.18 mg/dL (ref 0.61–1.24)
GFR, Estimated: >60 mL/min (ref >60)

## 2024-09-15 SURGERY — BYPASS GRAFT FEMORAL-POPLITEAL ARTERY
Anesthesia: General | Site: Leg Upper | Laterality: Left

## 2024-09-15 MED ORDER — CHLORHEXIDINE GLUCONATE CLOTH 2 % EX PADS
6.0000 | MEDICATED_PAD | Freq: Every day | CUTANEOUS | Status: DC
  Administered 2024-09-16 – 2024-09-17 (×2): 6 via TOPICAL

## 2024-09-15 MED ORDER — KETAMINE HCL 10 MG/ML IJ SOLN
INTRAMUSCULAR | Status: DC | PRN
  Administered 2024-09-15: 30 mg via INTRAVENOUS

## 2024-09-15 MED ORDER — HEPARIN SODIUM (PORCINE) 1000 UNIT/ML IJ SOLN
INTRAMUSCULAR | Status: AC
  Filled 2024-09-15: qty 10

## 2024-09-15 MED ORDER — ROCURONIUM BROMIDE 10 MG/ML (PF) SYRINGE
PREFILLED_SYRINGE | INTRAVENOUS | Status: DC | PRN
  Administered 2024-09-15: 60 mg via INTRAVENOUS
  Administered 2024-09-15 (×2): 15 mg via INTRAVENOUS

## 2024-09-15 MED ORDER — PHENYLEPHRINE HCL-NACL 20-0.9 MG/250ML-% IV SOLN
INTRAVENOUS | Status: DC | PRN
  Administered 2024-09-15: 30 ug/min via INTRAVENOUS

## 2024-09-15 MED ORDER — FENTANYL CITRATE (PF) 250 MCG/5ML IJ SOLN
INTRAMUSCULAR | Status: AC
  Filled 2024-09-15: qty 5

## 2024-09-15 MED ORDER — ROCURONIUM BROMIDE 10 MG/ML (PF) SYRINGE
PREFILLED_SYRINGE | INTRAVENOUS | Status: AC
  Filled 2024-09-15: qty 10

## 2024-09-15 MED ORDER — DIPHENHYDRAMINE HCL 25 MG PO CAPS
25.0000 mg | ORAL_CAPSULE | ORAL | Status: DC | PRN
  Administered 2024-09-15 – 2024-09-17 (×4): 25 mg via ORAL
  Filled 2024-09-15 (×4): qty 1

## 2024-09-15 MED ORDER — PHENYLEPHRINE 80 MCG/ML (10ML) SYRINGE FOR IV PUSH (FOR BLOOD PRESSURE SUPPORT)
PREFILLED_SYRINGE | INTRAVENOUS | Status: DC | PRN
  Administered 2024-09-15: 120 ug via INTRAVENOUS
  Administered 2024-09-15: 200 ug via INTRAVENOUS
  Administered 2024-09-15: 120 ug via INTRAVENOUS
  Administered 2024-09-15: 200 ug via INTRAVENOUS
  Administered 2024-09-15: 160 ug via INTRAVENOUS

## 2024-09-15 MED ORDER — CHLORHEXIDINE GLUCONATE 0.12 % MT SOLN: 15.0000 mL | OROMUCOSAL | Status: AC

## 2024-09-15 MED ORDER — PROTAMINE SULFATE 10 MG/ML IV SOLN
INTRAVENOUS | Status: AC
  Filled 2024-09-15: qty 5

## 2024-09-15 MED ORDER — CEFAZOLIN SODIUM-DEXTROSE 2-4 GM/100ML-% IV SOLN
2.0000 g | Freq: Three times a day (TID) | INTRAVENOUS | Status: AC
  Administered 2024-09-15 – 2024-09-16 (×2): 2 g via INTRAVENOUS
  Filled 2024-09-15: qty 100

## 2024-09-15 MED ORDER — MIDAZOLAM HCL 2 MG/2ML IJ SOLN
INTRAMUSCULAR | Status: DC | PRN
  Administered 2024-09-15: 2 mg via INTRAVENOUS

## 2024-09-15 MED ORDER — LIDOCAINE 2% (20 MG/ML) 5 ML SYRINGE
INTRAMUSCULAR | Status: DC | PRN
  Administered 2024-09-15: 40 mg via INTRAVENOUS

## 2024-09-15 MED ORDER — CHLORHEXIDINE GLUCONATE 0.12 % MT SOLN
OROMUCOSAL | Status: AC
  Administered 2024-09-15: 15 mL via OROMUCOSAL
  Filled 2024-09-15: qty 15

## 2024-09-15 MED ORDER — KETAMINE HCL 50 MG/5ML IJ SOSY
PREFILLED_SYRINGE | INTRAMUSCULAR | Status: AC
  Filled 2024-09-15: qty 5

## 2024-09-15 MED ORDER — MUPIROCIN 2 % EX OINT
1.0000 | TOPICAL_OINTMENT | Freq: Two times a day (BID) | CUTANEOUS | Status: DC
  Administered 2024-09-15 – 2024-09-17 (×4): 1 via NASAL
  Filled 2024-09-15: qty 22

## 2024-09-15 MED ORDER — FENTANYL CITRATE (PF) 100 MCG/2ML IJ SOLN
INTRAMUSCULAR | Status: AC
  Filled 2024-09-15: qty 2

## 2024-09-15 MED ORDER — FENTANYL CITRATE (PF) 100 MCG/2ML IJ SOLN
25.0000 ug | INTRAMUSCULAR | Status: DC | PRN
  Administered 2024-09-15 (×2): 50 ug via INTRAVENOUS

## 2024-09-15 MED ORDER — FENTANYL CITRATE (PF) 250 MCG/5ML IJ SOLN
INTRAMUSCULAR | Status: DC | PRN
  Administered 2024-09-15: 50 ug via INTRAVENOUS
  Administered 2024-09-15: 75 ug via INTRAVENOUS
  Administered 2024-09-15: 25 ug via INTRAVENOUS
  Administered 2024-09-15: 100 ug via INTRAVENOUS

## 2024-09-15 MED ORDER — HEPARIN 6000 UNIT IRRIGATION SOLUTION
Status: AC
Start: 2024-09-15 — End: 2024-09-15
  Filled 2024-09-15: qty 500

## 2024-09-15 MED ORDER — PROPOFOL 10 MG/ML IV BOLUS
INTRAVENOUS | Status: AC
  Filled 2024-09-15: qty 20

## 2024-09-15 MED ORDER — OXYCODONE HCL 5 MG PO TABS: 5.0000 mg | ORAL_TABLET | Freq: Once | ORAL | Status: AC | PRN

## 2024-09-15 MED ORDER — MIDAZOLAM HCL 2 MG/2ML IJ SOLN
INTRAMUSCULAR | Status: AC
Start: 2024-09-15 — End: 2024-09-15
  Filled 2024-09-15: qty 2

## 2024-09-15 MED ORDER — ONDANSETRON HCL 4 MG/2ML IJ SOLN
INTRAMUSCULAR | Status: DC | PRN
  Administered 2024-09-15: 4 mg via INTRAVENOUS

## 2024-09-15 MED ORDER — AMISULPRIDE (ANTIEMETIC) 5 MG/2ML IV SOLN: 10.0000 mg | Freq: Once | INTRAVENOUS | Status: DC | PRN

## 2024-09-15 MED ORDER — OXYCODONE HCL 5 MG/5ML PO SOLN
ORAL | Status: AC
  Filled 2024-09-15: qty 5

## 2024-09-15 MED ORDER — LACTATED RINGERS IV SOLN: INTRAVENOUS | Status: DC

## 2024-09-15 MED ORDER — PROTAMINE SULFATE 10 MG/ML IV SOLN
INTRAVENOUS | Status: DC | PRN
Start: 2024-09-15 — End: 2024-09-15
  Administered 2024-09-15: 50 mg via INTRAVENOUS

## 2024-09-15 MED ORDER — 0.9 % SODIUM CHLORIDE (POUR BTL) OPTIME
TOPICAL | Status: DC | PRN
  Administered 2024-09-15: 2000 mL

## 2024-09-15 MED ORDER — ALBUMIN HUMAN 5 % IV SOLN: INTRAVENOUS | Status: DC | PRN

## 2024-09-15 MED ORDER — OXYCODONE HCL 5 MG/5ML PO SOLN
5.0000 mg | Freq: Once | ORAL | Status: AC | PRN
  Administered 2024-09-15: 5 mg via ORAL

## 2024-09-15 MED ORDER — HEPARIN SODIUM (PORCINE) 1000 UNIT/ML IJ SOLN
INTRAMUSCULAR | Status: DC | PRN
  Administered 2024-09-15: 1000 [IU] via INTRAVENOUS
  Administered 2024-09-15: 8000 [IU] via INTRAVENOUS

## 2024-09-15 MED ORDER — EPHEDRINE SULFATE-NACL 50-0.9 MG/10ML-% IV SOSY
PREFILLED_SYRINGE | INTRAVENOUS | Status: DC | PRN
Start: 2024-09-15 — End: 2024-09-15
  Administered 2024-09-15 (×3): 2.5 mg via INTRAVENOUS

## 2024-09-15 MED ORDER — HEPARIN 6000 UNIT IRRIGATION SOLUTION
Status: DC | PRN
  Administered 2024-09-15: 1

## 2024-09-15 MED ORDER — HEMOSTATIC AGENTS (NO CHARGE) OPTIME
TOPICAL | Status: DC | PRN
  Administered 2024-09-15: 1 via TOPICAL

## 2024-09-15 MED ORDER — SURGIFLO WITH THROMBIN (HEMOSTATIC MATRIX KIT) OPTIME
TOPICAL | Status: DC | PRN
  Administered 2024-09-15: 1 via TOPICAL

## 2024-09-15 MED ORDER — PROPOFOL 10 MG/ML IV BOLUS
INTRAVENOUS | Status: DC | PRN
  Administered 2024-09-15: 120 mg via INTRAVENOUS

## 2024-09-15 MED ORDER — DEXAMETHASONE SODIUM PHOSPHATE 10 MG/ML IJ SOLN
INTRAMUSCULAR | Status: DC | PRN
  Administered 2024-09-15: 8 mg via INTRAVENOUS

## 2024-09-15 MED ORDER — LIDOCAINE 2% (20 MG/ML) 5 ML SYRINGE
INTRAMUSCULAR | Status: AC
  Filled 2024-09-15: qty 5

## 2024-09-15 MED ORDER — SODIUM CHLORIDE 0.9 % IV SOLN: 12.5000 mg | INTRAVENOUS | Status: DC | PRN

## 2024-09-15 MED ORDER — SUGAMMADEX SODIUM 200 MG/2ML IV SOLN
INTRAVENOUS | Status: DC | PRN
  Administered 2024-09-15: 200 mg via INTRAVENOUS

## 2024-09-15 SURGICAL SUPPLY — 55 items
BAG COUNTER SPONGE SURGICOUNT (BAG) ×2 IMPLANT
BANDAGE ESMARK 6X9 LF (GAUZE/BANDAGES/DRESSINGS) IMPLANT
BNDG COMPR ESMARK 6X3 LF (GAUZE/BANDAGES/DRESSINGS) IMPLANT
CANISTER SUCTION 3000ML PPV (SUCTIONS) ×2 IMPLANT
CANNULA VESSEL 3MM 2 BLNT TIP (CANNULA) ×2 IMPLANT
CATH EMB 4FR 40 (CATHETERS) IMPLANT
CLIP TI MEDIUM 24 (CLIP) ×2 IMPLANT
CLIP TI WIDE RED SMALL 24 (CLIP) ×2 IMPLANT
CUFF TOURN SGL QUICK 42 (TOURNIQUET CUFF) IMPLANT
CUFF TRNQT CYL 24X4X16.5-23 (TOURNIQUET CUFF) IMPLANT
CUFF TRNQT CYL 34X4.125X (TOURNIQUET CUFF) IMPLANT
DERMABOND ADVANCED .7 DNX12 (GAUZE/BANDAGES/DRESSINGS) ×2 IMPLANT
DRAPE HALF SHEET 40X57 (DRAPES) IMPLANT
DRAPE X-RAY CASS 24X20 (DRAPES) IMPLANT
ELECTRODE REM PT RTRN 9FT ADLT (ELECTROSURGICAL) ×2 IMPLANT
EVACUATOR SILICONE 100CC (DRAIN) IMPLANT
GLOVE BIOGEL PI IND STRL 8 (GLOVE) ×2 IMPLANT
GLOVE SURG SS PI 7.5 STRL IVOR (GLOVE) ×2 IMPLANT
GOWN STRL REUS W/ TWL LRG LVL3 (GOWN DISPOSABLE) ×4 IMPLANT
GOWN STRL REUS W/ TWL XL LVL3 (GOWN DISPOSABLE) ×2 IMPLANT
GRAFT PROPATEN W/RING 6X80X60 (Vascular Products) IMPLANT
HEMOSTAT SNOW SURGICEL 2X4 (HEMOSTASIS) IMPLANT
INSERT FOGARTY SM (MISCELLANEOUS) IMPLANT
KIT BASIN OR (CUSTOM PROCEDURE TRAY) ×2 IMPLANT
KIT TURNOVER KIT B (KITS) ×2 IMPLANT
MARKER GRAFT CORONARY BYPASS (MISCELLANEOUS) IMPLANT
PACK PERIPHERAL VASCULAR (CUSTOM PROCEDURE TRAY) ×2 IMPLANT
PAD ARMBOARD POSITIONER FOAM (MISCELLANEOUS) ×4 IMPLANT
PADDING CAST COTTON 6X4 STRL (CAST SUPPLIES) IMPLANT
PATCH VASC XENOSURE 1X6 (Vascular Products) IMPLANT
SET COLLECT BLD 21X3/4 12 (NEEDLE) IMPLANT
SOLN 0.9% NACL 1000 ML (IV SOLUTION) ×4 IMPLANT
SOLN 0.9% NACL POUR BTL 1000ML (IV SOLUTION) ×4 IMPLANT
SOLN STERILE WATER 1000 ML (IV SOLUTION) ×2 IMPLANT
SOLN STERILE WATER BTL 1000 ML (IV SOLUTION) ×2 IMPLANT
SPONGE T-LAP 18X18 ~~LOC~~+RFID (SPONGE) IMPLANT
STOPCOCK 4 WAY LG BORE MALE ST (IV SETS) IMPLANT
SURGIFLO W/THROMBIN 8M KIT (HEMOSTASIS) IMPLANT
SUT ETHILON 3 0 PS 1 (SUTURE) IMPLANT
SUT GORETEX 6.0 TT9 (SUTURE) IMPLANT
SUT PROLENE 5 0 C 1 24 (SUTURE) ×2 IMPLANT
SUT PROLENE 6 0 BV (SUTURE) ×2 IMPLANT
SUT PROLENE 7 0 BV 1 (SUTURE) IMPLANT
SUT PROLENE 7 0 BV1 MDA (SUTURE) IMPLANT
SUT SILK 2 0 SH (SUTURE) ×2 IMPLANT
SUT SILK 3-0 18XBRD TIE 12 (SUTURE) IMPLANT
SUT VIC AB 2-0 CT1 TAPERPNT 27 (SUTURE) ×4 IMPLANT
SUT VIC AB 3-0 SH 27X BRD (SUTURE) ×4 IMPLANT
SUT VIC AB 4-0 PS2 18 (SUTURE) ×4 IMPLANT
SYR 3ML LL SCALE MARK (SYRINGE) IMPLANT
TOWEL GREEN STERILE (TOWEL DISPOSABLE) ×2 IMPLANT
TRAY FOLEY MTR SLVR 14FR STAT (SET/KITS/TRAYS/PACK) IMPLANT
TRAY FOLEY MTR SLVR 16FR STAT (SET/KITS/TRAYS/PACK) ×2 IMPLANT
TUBING EXTENTION W/L.L. (IV SETS) IMPLANT
UNDERPAD 30X36 HEAVY ABSORB (UNDERPADS AND DIAPERS) ×2 IMPLANT

## 2024-09-15 NOTE — Anesthesia Postprocedure Evaluation (Signed)
 Anesthesia Post Note  Patient: BREYSON KELM  Procedure(s) Performed: BYPASS GRAFT FEMORAL-ABOVE KNEE POPLITEAL ARTERY USING 6mm PROPATEN REMOVABLE RING GRAFT (Left: Leg Upper) ENDARTERECTOMY, FEMORAL (Left: Groin) PATCH ANGIOPLASTY USIGN XENOSURE BIOLOGIC PATCH (Left: Groin)     Patient location during evaluation: PACU Anesthesia Type: General Level of consciousness: awake and alert Pain management: pain level controlled Vital Signs Assessment: post-procedure vital signs reviewed and stable Respiratory status: spontaneous breathing, nonlabored ventilation and respiratory function stable Cardiovascular status: blood pressure returned to baseline Postop Assessment: no apparent nausea or vomiting Anesthetic complications: no   No notable events documented.  Last Vitals:  Vitals:   09/15/24 1530 09/15/24 1545  BP: 136/70 132/71  Pulse: 68 71  Resp: 12 12  Temp:  36.6 C  SpO2: 94% 97%    Last Pain:  Vitals:   09/15/24 1550  TempSrc:   PainSc: 8                  Vertell Row

## 2024-09-15 NOTE — Anesthesia Procedure Notes (Signed)
 Procedure Name: Intubation Date/Time: 09/15/2024 11:25 AM  Performed by: Roslynn Waddell LABOR, CRNAPre-anesthesia Checklist: Patient identified, Emergency Drugs available, Suction available and Patient being monitored Patient Re-evaluated:Patient Re-evaluated prior to induction Oxygen Delivery Method: Circle System Utilized Preoxygenation: Pre-oxygenation with 100% oxygen Induction Type: IV induction Ventilation: Mask ventilation without difficulty Laryngoscope Size: Mac and 3 Grade View: Grade I Tube type: Oral Tube size: 7.5 mm Number of attempts: 1 Airway Equipment and Method: Stylet and Oral airway Placement Confirmation: ETT inserted through vocal cords under direct vision, positive ETCO2 and breath sounds checked- equal and bilateral Secured at: 23 cm Tube secured with: Tape Dental Injury: Teeth and Oropharynx as per pre-operative assessment  Comments: Atraumatic induction/intubation. Dentition and oral mucosa as per preop (edentulous uppers)

## 2024-09-15 NOTE — Anesthesia Procedure Notes (Addendum)
 Arterial Line Insertion Start/End9/30/2025 11:25 AM, 09/15/2024 11:28 AM Performed by: Tilford Franky BIRCH, MD, anesthesiologist  Patient location: Pre-op. Preanesthetic checklist: patient identified, IV checked, site marked, risks and benefits discussed, surgical consent, monitors and equipment checked, pre-op evaluation, timeout performed and anesthesia consent Lidocaine  1% used for infiltration Right, radial was placed Catheter size: 20 G Hand hygiene performed  and maximum sterile barriers used   Attempts: 1 Procedure performed without using ultrasound guided technique. Following insertion, dressing applied and Biopatch. Post procedure assessment: normal and unchanged  Post procedure complications: second provider assisted. Patient tolerated the procedure well with no immediate complications.

## 2024-09-15 NOTE — Progress Notes (Signed)
 Patient brought to 4E from PACU. Telemetry box applied, CCMD notified. Patient oriented to room and staff. Call bell in reach.   09/15/24 1606  Vitals  Temp 97.8 F (36.6 C)  Temp Source Oral  BP (!) 147/74  MAP (mmHg) 95  BP Location Left Arm  BP Method Automatic  Patient Position (if appropriate) Lying  Pulse Rate 84  Pulse Rate Source Monitor  ECG Heart Rate 84  Resp 18  MEWS COLOR  MEWS Score Color Green  Oxygen Therapy  SpO2 100 %  O2 Device Room Air  Art Line  Arterial Line BP 154/69  Arterial Line MAP (mmHg) 98 mmHg  MEWS Score  MEWS Temp 0  MEWS Systolic 0  MEWS Pulse 0  MEWS RR 0  MEWS LOC 0  MEWS Score 0

## 2024-09-15 NOTE — Transfer of Care (Signed)
 Immediate Anesthesia Transfer of Care Note  Patient: Donald Palmer  Procedure(s) Performed: BYPASS GRAFT FEMORAL-ABOVE KNEE POPLITEAL ARTERY USING 6mm PROPATEN REMOVABLE RING GRAFT (Left: Leg Upper) ENDARTERECTOMY, FEMORAL (Left: Groin) PATCH ANGIOPLASTY USIGN XENOSURE BIOLOGIC PATCH (Left: Groin)  Patient Location: PACU  Anesthesia Type:General  Level of Consciousness: awake and alert   Airway & Oxygen Therapy: Patient Spontanous Breathing and Patient connected to face mask oxygen  Post-op Assessment: Report given to RN and Post -op Vital signs reviewed and stable  Post vital signs: Reviewed and stable  Last Vitals:  Vitals Value Taken Time  BP 144/81 09/15/24 14:19  Temp    Pulse 73 09/15/24 14:24  Resp 14 09/15/24 14:24  SpO2 100 % 09/15/24 14:24  Vitals shown include unfiled device data.  Last Pain:  Vitals:   09/15/24 0940  TempSrc: Oral  PainSc:       Patients Stated Pain Goal: 0 (09/14/24 1714)  Complications: No notable events documented.

## 2024-09-15 NOTE — Progress Notes (Signed)
  Day of Surgery Note    Subjective:  resting in recovery   Vitals:   09/15/24 1430 09/15/24 1445  BP: (!) 155/83 136/76  Pulse: 72 70  Resp: 14 14  Temp:    SpO2: 100% 98%    Incisions:   left groin and left AK incisions clean without hematoma Extremities:  brisk doppler flow left peroneal>DP/PT but all brisk; left calf is soft.  Cardiac:  regular Lungs:  non labored    Assessment/Plan:  This is a 63 y.o. male who is s/p  Left femoral to popliteal bypass grafting  -pt with brisk doppler flow left foot -incisions look good without hematoma -to 4 east later this afternoon.   Lucie Apt, PA-C 09/15/2024 3:00 PM [phone]

## 2024-09-15 NOTE — Interval H&P Note (Deleted)
 History and Physical Interval Note:  09/15/2024 10:18 AM  Donald Palmer  has presented today for surgery, with the diagnosis of stenosis L femoral stent.  The various methods of treatment have been discussed with the patient and family. After consideration of risks, benefits and other options for treatment, the patient has consented to  Procedure(s): BYPASS GRAFT FEMORAL-POPLITEAL ARTERY (Left) as a surgical intervention.  The patient's history has been reviewed, patient examined, no change in status, stable for surgery.  I have reviewed the patient's chart and labs.  Questions were answered to the patient's satisfaction.     Malvina New

## 2024-09-15 NOTE — Progress Notes (Signed)
  Progress Note    09/15/2024 9:40 AM * Day of Surgery *  Subjective: Continues to complain of pain in  Vitals:   09/15/24 0353 09/15/24 0735  BP: 111/70 115/72  Pulse: 70 66  Resp: 18 16  Temp: 98 F (36.7 C) 98.6 F (37 C)  SpO2: 99% 99%    Physical Exam: Awake alert and oriented Respirations Left groin soft without hematoma  CBC    Component Value Date/Time   WBC 6.4 09/15/2024 0528   RBC 5.62 09/15/2024 0528   HGB 12.1 (L) 09/15/2024 0528   HGB 11.0 (L) 12/02/2023 1442   HCT 39.4 09/15/2024 0528   HCT 35.7 (L) 12/02/2023 1442   PLT 287 09/15/2024 0528   PLT 728 (H) 12/02/2023 1442   MCV 70.1 (L) 09/15/2024 0528   MCV 69 (L) 12/02/2023 1442   MCH 21.5 (L) 09/15/2024 0528   MCHC 30.7 09/15/2024 0528   RDW 16.5 (H) 09/15/2024 0528   RDW 13.7 12/02/2023 1442   LYMPHSABS 2.7 09/06/2024 1456   LYMPHSABS 2.7 10/11/2023 1307   MONOABS 0.7 09/06/2024 1456   EOSABS 0.1 09/06/2024 1456   EOSABS 0.2 10/11/2023 1307   BASOSABS 0.1 09/06/2024 1456   BASOSABS 0.1 10/11/2023 1307    BMET    Component Value Date/Time   NA 133 (L) 09/15/2024 0528   NA 142 12/02/2023 1442   K 4.0 09/15/2024 0528   CL 100 09/15/2024 0528   CO2 26 09/15/2024 0528   GLUCOSE 115 (H) 09/15/2024 0528   BUN 7 (L) 09/15/2024 0528   BUN 7 (L) 12/02/2023 1442   CREATININE 1.03 09/15/2024 0528   CREATININE 0.77 10/28/2014 1112   CALCIUM  9.0 09/15/2024 0528   GFRNONAA >60 09/15/2024 0528   GFRNONAA >89 10/28/2014 1112   GFRAA 87 04/28/2020 0944   GFRAA >89 10/28/2014 1112    INR    Component Value Date/Time   INR 1.0 09/14/2024 2044     Intake/Output Summary (Last 24 hours) at 09/15/2024 0940 Last data filed at 09/14/2024 1806 Gross per 24 hour  Intake 179.43 ml  Output 500 ml  Net -320.57 ml     Assessment/plan:  63 y.o. male is s/p diagnostic angio for occluded stents.  Plan for left common femoral endarterectomy with common femoral to either above-knee or below-knee  popliteal bypass today.  We again discussed the risk benefits and alternatives and he demonstrates good understanding.     Margalit Leece C. Sheree, MD [facility] [place] Office: [phone] Pager: [phone]  09/15/2024 9:40 AM

## 2024-09-15 NOTE — Op Note (Signed)
 Patient name: Donald Palmer MRN: [id number] DOB: 14-Aug-1961 Sex: male  09/15/2024 Pre-operative Diagnosis: Left leg rest pain Post-operative diagnosis:  Same Surgeon:  Malvina New Assistants:  Norman Serve, Curry Damme, Procedure:   #1: Left femoral endarterectomy with bovine pericardial patch angioplasty   #2: Left femoral to above-knee popliteal artery bypass graft with 6 mm external ring PTFE Anesthesia:  General Blood Loss:  200 cc Specimens:  none  Findings: Extensive common femoral plaque.  Endarterectomy was performed down into the profunda femoral artery for approximately 1 cm.  Plaque was removed from the distal external iliac artery.  The above-knee popliteal artery was very calcified however there was a soft spot just distal to the stents where the anastomosis was performed.  Intraoperative ultrasound evaluation of the saphenous vein showed a very small saphenous vein  Indications: This is a 63 year old gentleman with history of occluded left SFA stents who now has rest pain.  He comes in today for surgical revascularization  Procedure:  The patient was identified in the holding area and taken to [facility] OR ROOM 11  The patient was then placed supine on the table. general anesthesia was administered.  The patient was prepped and draped in the usual sterile fashion.  A time out was called and antibiotics were administered.  An assistant was necessary to expedite the procedure.  They helped with exposure by providing suction and retraction.  They helped with following the suture and wound closure.  SonoSite was used to evaluate the saphenous vein which was very small.  An oblique incision was made just above the groin crease.  Cautery was used to divide the subcutaneous tissue down to the femoral sheath which was opened sharply.  I exposed the common femoral artery and distal external iliac artery.  The crossing circumflex iliac vein was divided between silk ties.  I mobilized the  profundofemoral artery down to his primary branch which was approximately 2 cm.  Next, a medial above-knee incision was made.  Cautery was used to divide subcutaneous tissue.  I entered the popliteal space and identified the above-knee popliteal artery.  I used ultrasound to identify the previously placed stents.  Approximately 2 cm distal to this there was a soft spot.  The remaining exposed portion of the popliteal artery was relatively calcified.  I felt the artery behind the knee and it was calcified at this level as well.  A tunneler was used to create a subsartorial tunnel and a 6 mm PTFE graft was brought through the tunnel.  The patient was then fully heparinized.  After the heparin  circulated, the femoral vessels were occluded.  A #11 blade was used to make an arteriotomy which was extended longitudinally with Potts scissors.  Endarterectomy was performed in the common femoral artery and the proximal superficial femoral artery.  I then performed eversion endarterectomy into the profundofemoral artery.  The profundus was calcified out past his primary branch and so I got a good endpoint and tacked this down with 7-0 Prolene sutures.  I then inserted a Fogarty balloon up into the external iliac and performed endarterectomy up into the distal external iliac.  At this point I had excellent inflow.  Because of the orientation of the graft and the distal extent of the incision onto the profundofemoral artery, I felt that patch repair first was necessary.  A bovine pericardial patch was selected and patch angioplasty was performed with running 6-0 Prolene.  Prior to completion, the appropriate flushing maneuvers were  performed.  Next, a #11 blade was used to make an arteriotomy in the patch just above the origin of the profundofemoral artery.  The PTFE graft was beveled to fit the size of the arteriotomy and a running anastomosis was performed with 6-0 Prolene.  At the same time, Dr. Pearline performed the distal  anastomosis.  Clamps were placed on the popliteal artery and then it was opened with an 11 blade.  The graft was pulled to the appropriate length and a running end-to-side anastomosis was performed with 6-0 Prolene.  Prior to completion, the appropriate flushing maneuvers were performed.  The anastomoses were completed.  Blood flow was reestablished to the left leg.  There were excellent signals in the profunda and common femoral artery.  There was a graft dependent signal in the above-knee popliteal artery distal to the graft.  The patient also had a graft dependent signal in the posterior tibial at the ankle.  I satisfied with these results.  The heparin  was reversed with 50 mg of protamine .  Incisions were then irrigated.  The femoral sheath was reapproximated with 2-0 Vicryl.  The subcutaneous tissue in the groin was closed with running 3-0 Vicryl and the skin was closed with 4-0 Monocryl.  The above-knee incision was closed by reapproximating the fascia with 2-0 Vicryl.  The skin was closed with running 4 Monocryl.  Dermabond was applied to the incisions.  The patient successfully extubated and taken to recovery in stable condition.   Disposition: To PACU stable.   ALONSO Malvina New, M.D., [facility] [place] Office: [phone] Pager:  [phone]

## 2024-09-15 NOTE — Plan of Care (Signed)

## 2024-09-15 NOTE — Anesthesia Preprocedure Evaluation (Addendum)
 Anesthesia Evaluation  Patient identified by MRN, date of birth, ID band Patient awake    Reviewed: Allergy & Precautions, NPO status , Patient's Chart, lab work & pertinent test results  History of Anesthesia Complications Negative for: history of anesthetic complications  Airway Mallampati: II  TM Distance: >3 FB Neck ROM: Full    Dental  (+) Dental Advisory Given, Partial Lower   Pulmonary former smoker   Pulmonary exam normal        Cardiovascular hypertension, Pt. on home beta blockers and Pt. on medications + CAD and + Peripheral Vascular Disease  Normal cardiovascular exam     Neuro/Psych negative neurological ROS  negative psych ROS   GI/Hepatic ,GERD  Controlled and Medicated,,(+)     substance abuse  , Hepatitis -, C  Endo/Other  diabetes, Type 2   On GLP-1a   Renal/GU negative Renal ROS     Musculoskeletal negative musculoskeletal ROS (+)    Abdominal   Peds  Hematology  On plavix     Anesthesia Other Findings   Reproductive/Obstetrics                              Anesthesia Physical Anesthesia Plan  ASA: 3  Anesthesia Plan: General   Post-op Pain Management: Ofirmev  IV (intra-op)* and Ketamine IV*   Induction: Intravenous and Rapid sequence  PONV Risk Score and Plan: 2 and Treatment may vary due to age or medical condition, Ondansetron , Dexamethasone  and Midazolam   Airway Management Planned: Oral ETT  Additional Equipment: Arterial line  Intra-op Plan:   Post-operative Plan: Extubation in OR  Informed Consent: I have reviewed the patients History and Physical, chart, labs and discussed the procedure including the risks, benefits and alternatives for the proposed anesthesia with the patient or authorized representative who has indicated his/her understanding and acceptance.     Dental advisory given  Plan Discussed with: CRNA and  Anesthesiologist  Anesthesia Plan Comments:          Anesthesia Quick Evaluation

## 2024-09-15 NOTE — OR Nursing (Signed)
 Patient came into OR wearing his top dentures. They were removed by the patient and placed into a pink denture cup with a patient sticker on it. The denture cup was then placed on the patient's chart at the circulator's desk.

## 2024-09-15 NOTE — Progress Notes (Signed)
    Subjective  -   C/o leftleg pain   Physical Exam:  Access site soft       Assessment/Plan:    Discussed proceeding with left leg bypass.  Vein looks marginal so may need PTFE.  Discussed the importance of medical compliance after surgery to preserve patency of his bypass  Donald Palmer 09/15/2024 10:20 AM --  Vitals:   09/15/24 0735 09/15/24 0940  BP: 115/72 133/76  Pulse: 66 68  Resp: 16 17  Temp: 98.6 F (37 C) 98.7 F (37.1 C)  SpO2: 99% 99%    Intake/Output Summary (Last 24 hours) at 09/15/2024 1020 Last data filed at 09/14/2024 1806 Gross per 24 hour  Intake 179.43 ml  Output 500 ml  Net -320.57 ml     Laboratory CBC    Component Value Date/Time   WBC 6.4 09/15/2024 0528   HGB 12.1 (L) 09/15/2024 0528   HGB 11.0 (L) 12/02/2023 1442   HCT 39.4 09/15/2024 0528   HCT 35.7 (L) 12/02/2023 1442   PLT 287 09/15/2024 0528   PLT 728 (H) 12/02/2023 1442    BMET    Component Value Date/Time   NA 133 (L) 09/15/2024 0528   NA 142 12/02/2023 1442   K 4.0 09/15/2024 0528   CL 100 09/15/2024 0528   CO2 26 09/15/2024 0528   GLUCOSE 115 (H) 09/15/2024 0528   BUN 7 (L) 09/15/2024 0528   BUN 7 (L) 12/02/2023 1442   CREATININE 1.03 09/15/2024 0528   CREATININE 0.77 10/28/2014 1112   CALCIUM  9.0 09/15/2024 0528   GFRNONAA >60 09/15/2024 0528   GFRNONAA >89 10/28/2014 1112   GFRAA 87 04/28/2020 0944   GFRAA >89 10/28/2014 1112    COAG Lab Results  Component Value Date   INR 1.0 09/14/2024   INR 1.2 11/13/2023   INR 1.0 11/11/2023   No results found for: PTT  Antibiotics Anti-infectives (From admission, onward)    Start     Dose/Rate Route Frequency Ordered Stop   09/14/24 1726  ceFAZolin  (ANCEF ) IVPB 2g/100 mL premix        2 g 200 mL/hr over 30 Minutes Intravenous 30 min pre-op 09/14/24 1726          V. Malvina Serene CLORE, M.D., [facility] [place] Office: [phone] Pager:  [phone]

## 2024-09-15 NOTE — Progress Notes (Signed)
 Patient experiencing itching all over, no redness, requesting Benadryl . MD on call paged. Awaiting response.

## 2024-09-15 NOTE — H&P (View-Only) (Signed)
  Progress Note    09/15/2024 9:40 AM * Day of Surgery *  Subjective: Continues to complain of pain in  Vitals:   09/15/24 0353 09/15/24 0735  BP: 111/70 115/72  Pulse: 70 66  Resp: 18 16  Temp: 98 F (36.7 C) 98.Donald F (37 C)  SpO2: 99% 99%    Physical Exam: Awake alert and oriented Respirations Left groin soft without hematoma  CBC    Component Value Date/Time   WBC Donald.4 09/15/2024 0528   RBC 5.62 09/15/2024 0528   HGB 12.1 (L) 09/15/2024 0528   HGB 11.0 (L) 12/02/2023 1442   HCT 39.4 09/15/2024 0528   HCT 35.7 (L) 12/02/2023 1442   PLT 287 09/15/2024 0528   PLT 728 (H) 12/02/2023 1442   MCV 70.1 (L) 09/15/2024 0528   MCV 69 (L) 12/02/2023 1442   MCH 21.5 (L) 09/15/2024 0528   MCHC 30.7 09/15/2024 0528   RDW 16.5 (H) 09/15/2024 0528   RDW 13.7 12/02/2023 1442   LYMPHSABS 2.7 09/06/2024 1456   LYMPHSABS 2.7 10/11/2023 1307   MONOABS 0.7 09/06/2024 1456   EOSABS 0.1 09/06/2024 1456   EOSABS 0.2 10/11/2023 1307   BASOSABS 0.1 09/06/2024 1456   BASOSABS 0.1 10/11/2023 1307    BMET    Component Value Date/Time   NA 133 (L) 09/15/2024 0528   NA 142 12/02/2023 1442   K 4.0 09/15/2024 0528   CL 100 09/15/2024 0528   CO2 26 09/15/2024 0528   GLUCOSE 115 (H) 09/15/2024 0528   BUN 7 (L) 09/15/2024 0528   BUN 7 (L) 12/02/2023 1442   CREATININE 1.03 09/15/2024 0528   CREATININE 0.77 10/28/2014 1112   CALCIUM  9.0 09/15/2024 0528   GFRNONAA >60 09/15/2024 0528   GFRNONAA >89 10/28/2014 1112   GFRAA 87 04/28/2020 0944   GFRAA >89 10/28/2014 1112    INR    Component Value Date/Time   INR 1.0 09/14/2024 2044     Intake/Output Summary (Last 24 hours) at 09/15/2024 0940 Last data filed at 09/14/2024 1806 Gross per 24 hour  Intake 179.43 ml  Output 500 ml  Net -320.57 ml     Assessment/plan:  63 y.o. Palmer is s/p diagnostic angio for occluded stents.  Plan for left common femoral endarterectomy with common femoral to either above-knee or below-knee  popliteal bypass today.  We again discussed the risk benefits and alternatives and he demonstrates good understanding.     Margalit Leece C. Sheree, MD [facility] [place] Office: [phone] Pager: [phone]  09/15/2024 9:40 AM

## 2024-09-16 ENCOUNTER — Encounter (HOSPITAL_COMMUNITY): Payer: Self-pay | Admitting: Surgery

## 2024-09-16 DIAGNOSIS — Z95828 Presence of other vascular implants and grafts: Secondary | ICD-10-CM

## 2024-09-16 DIAGNOSIS — Z48812 Encounter for surgical aftercare following surgery on the circulatory system: Secondary | ICD-10-CM

## 2024-09-16 LAB — CBC
HCT: 36 % — ABNORMAL LOW (ref 39.0–52.0)
Hemoglobin: 11.4 g/dL — ABNORMAL LOW (ref 13.0–17.0)
MCH: 21.8 pg — ABNORMAL LOW (ref 26.0–34.0)
MCHC: 31.7 g/dL (ref 30.0–36.0)
MCV: 69 fL — ABNORMAL LOW (ref 80.0–100.0)
Platelets: 259 K/uL (ref 150–400)
RBC: 5.22 MIL/uL (ref 4.22–5.81)
RDW: 15.9 % — ABNORMAL HIGH (ref 11.5–15.5)
WBC: 14.3 K/uL — ABNORMAL HIGH (ref 4.0–10.5)
nRBC: 0 % (ref 0.0–0.2)

## 2024-09-16 LAB — GLUCOSE, CAPILLARY
Glucose-Capillary: 241 mg/dL — ABNORMAL HIGH (ref 70–99)
Glucose-Capillary: 281 mg/dL — ABNORMAL HIGH (ref 70–99)
Glucose-Capillary: 190 mg/dL — ABNORMAL HIGH (ref 70–99)
Glucose-Capillary: 163 mg/dL — ABNORMAL HIGH (ref 70–99)
Glucose-Capillary: 272 mg/dL — ABNORMAL HIGH (ref 70–99)

## 2024-09-16 LAB — LIPID PANEL
Cholesterol: 128 mg/dL (ref 0–200)
HDL: 42 mg/dL (ref >40)
LDL Cholesterol: 66 mg/dL (ref 0–99)
Total CHOL/HDL Ratio: 3 ratio
Triglycerides: 101 mg/dL (ref <150)
VLDL: 20 mg/dL (ref 0–40)

## 2024-09-16 NOTE — Progress Notes (Addendum)
 PT Cancellation Note  Patient Details Name: Donald Palmer MRN: [id number] DOB: 12-01-1961   Cancelled Treatment:    Reason Eval/Treat Not Completed: Pain limiting ability to participate;Patient declined, no reason specified (Pt and spouse refusing mobility. Pt initially attempted to sit EOB pt not following directions for log roll to decrease strain on incision site; yelling out. Despite education and pt spouse stating she is in medical field pt continued to decline mobility. Will follow up as able and appropriate.)  Dorothyann Maier, DPT, CLT  Acute Rehabilitation Services Office: [phone] (Secure chat preferred)   Dorothyann VEAR Maier 09/16/2024, 2:50 PM

## 2024-09-16 NOTE — Progress Notes (Addendum)
  Progress Note    09/16/2024 8:57 AM 1 Day Post-Op  Subjective:  no complaints   Vitals:   09/16/24 0300 09/16/24 0826  BP: 132/68 133/67  Pulse: 81 90  Resp: 14 18  Temp: 99.1 F (37.3 C) 98.6 F (37 C)  SpO2: 100%    Physical Exam: Lungs:  non labored Incisions:  L groin and pop incisions c/d/i Extremities:  palpable L PT pulse Neurologic: A&O  CBC    Component Value Date/Time   WBC 14.3 (H) 09/16/2024 0400   RBC 5.22 09/16/2024 0400   HGB 11.4 (L) 09/16/2024 0400   HGB 11.0 (L) 12/02/2023 1442   HCT 36.0 (L) 09/16/2024 0400   HCT 35.7 (L) 12/02/2023 1442   PLT 259 09/16/2024 0400   PLT 728 (H) 12/02/2023 1442   MCV 69.0 (L) 09/16/2024 0400   MCV 69 (L) 12/02/2023 1442   MCH 21.8 (L) 09/16/2024 0400   MCHC 31.7 09/16/2024 0400   RDW 15.9 (H) 09/16/2024 0400   RDW 13.7 12/02/2023 1442   LYMPHSABS 2.7 09/06/2024 1456   LYMPHSABS 2.7 10/11/2023 1307   MONOABS 0.7 09/06/2024 1456   EOSABS 0.1 09/06/2024 1456   EOSABS 0.2 10/11/2023 1307   BASOSABS 0.1 09/06/2024 1456   BASOSABS 0.1 10/11/2023 1307    BMET    Component Value Date/Time   NA 135 09/15/2024 1304   NA 142 12/02/2023 1442   K 4.0 09/15/2024 1304   CL 100 09/15/2024 0528   CO2 26 09/15/2024 0528   GLUCOSE 115 (H) 09/15/2024 0528   BUN 7 (L) 09/15/2024 0528   BUN 7 (L) 12/02/2023 1442   CREATININE 1.18 09/15/2024 1827   CREATININE 0.77 10/28/2014 1112   CALCIUM  9.0 09/15/2024 0528   GFRNONAA >60 09/15/2024 1827   GFRNONAA >89 10/28/2014 1112   GFRAA 87 04/28/2020 0944   GFRAA >89 10/28/2014 1112    INR    Component Value Date/Time   INR 1.0 09/14/2024 2044     Intake/Output Summary (Last 24 hours) at 09/16/2024 0857 Last data filed at 09/16/2024 0546 Gross per 24 hour  Intake 1576.68 ml  Output 1990 ml  Net -413.32 ml     Assessment/Plan:  63 y.o. male is s/p L femoral to AK popliteal bypass with PTFE 1 Day Post-Op   LLE well perfused with palpable PT pulse Groin and  popliteal incisions are well-appearing; please leave open to air Out of bed with therapy teams today   Donnice Sender, PA-C [facility] [phone] 09/16/2024 8:57 AM  I have independently interviewed and examined patient and agree with PA assessment and plan above.  Left foot well-perfused pain appears improved.  Will need to clarify need for Eliquis  with cardiology otherwise okay to continue Plavix .  We again discussed the need for smoking cessation.  Zillah Alexie C. Sheree, MD [facility] [place] Office: [phone] Pager: [phone]

## 2024-09-16 NOTE — Progress Notes (Signed)
 PHARMACIST LIPID MONITORING   Donald Palmer is a 63 y.o. male admitted on 09/14/2024 with popliteal bypass.  Pharmacy has been consulted to optimize lipid-lowering therapy with the indication of secondary prevention for clinical ASCVD.  Recent Labs:  Lipid Panel (last 6 months):   Lab Results  Component Value Date   CHOL 128 09/16/2024   TRIG 101 09/16/2024   HDL 42 09/16/2024   CHOLHDL 3.0 09/16/2024   VLDL 20 09/16/2024   LDLCALC 66 09/16/2024    Hepatic function panel (last 6 months):   Lab Results  Component Value Date   AST 24 09/14/2024   ALT 16 09/14/2024   ALKPHOS 84 09/14/2024   BILITOT 0.5 09/14/2024    SCr (since admission):   Serum creatinine: 1.18 mg/dL 90/69/74 8172 Estimated creatinine clearance: 64.1 mL/min  Current therapy and lipid therapy tolerance Current lipid-lowering therapy: atorvastatin  80mg  Previous lipid-lowering therapies (if applicable): n/a Documented or reported allergies or intolerances to lipid-lowering therapies (if applicable): n/a  Assessment:   Patient agrees with changes to lipid-lowering therapy  Plan:    1.Statin intensity (high intensity recommended for all patients regardless of the LDL):  No statin changes. The patient is already on a high intensity statin.  2.Add ezetimibe (if any one of the following):   Not indicated at this time.  3.Refer to lipid clinic:   No  4.Follow-up with:  Primary care provider - Tobie Gaines, DO  5.Follow-up labs after discharge:  No changes in lipid therapy, repeat a lipid panel in one year.     Thank you for allowing pharmacy to be a part of this patient's care.  Shelba Collier, PharmD, BCPS Clinical Pharmacist

## 2024-09-17 ENCOUNTER — Other Ambulatory Visit (HOSPITAL_COMMUNITY): Payer: Self-pay

## 2024-09-17 LAB — CBC
HCT: 39.2 % (ref 39.0–52.0)
Hemoglobin: 12.1 g/dL — ABNORMAL LOW (ref 13.0–17.0)
MCH: 21.5 pg — ABNORMAL LOW (ref 26.0–34.0)
MCHC: 30.9 g/dL (ref 30.0–36.0)
MCV: 69.8 fL — ABNORMAL LOW (ref 80.0–100.0)
Platelets: 284 K/uL (ref 150–400)
RBC: 5.62 MIL/uL (ref 4.22–5.81)
RDW: 15.8 % — ABNORMAL HIGH (ref 11.5–15.5)
WBC: 15.9 K/uL — ABNORMAL HIGH (ref 4.0–10.5)
nRBC: 0 % (ref 0.0–0.2)

## 2024-09-17 LAB — GLUCOSE, CAPILLARY
Glucose-Capillary: 177 mg/dL — ABNORMAL HIGH (ref 70–99)
Glucose-Capillary: 214 mg/dL — ABNORMAL HIGH (ref 70–99)

## 2024-09-17 MED ORDER — BISACODYL 10 MG RE SUPP: 10.0000 mg | Freq: Every day | RECTAL | Status: DC | PRN

## 2024-09-17 MED ORDER — OXYCODONE-ACETAMINOPHEN 5-325 MG PO TABS
1.0000 | ORAL_TABLET | Freq: Four times a day (QID) | ORAL | 0 refills | Status: DC | PRN
  Filled 2024-09-17: qty 20, 5d supply, fill #0

## 2024-09-17 MED ORDER — MAGNESIUM HYDROXIDE 400 MG/5ML PO SUSP
15.0000 mL | Freq: Once | ORAL | Status: AC
  Administered 2024-09-17: 15 mL via ORAL
  Filled 2024-09-17 (×2): qty 30

## 2024-09-17 NOTE — Progress Notes (Signed)
 Patient walked to the bathroom with wife multiple times yesterday at day shift , and this  night shift as well with his walker, he did well and I got him medicated with pain meds as needed. Will continue to monitor.

## 2024-09-17 NOTE — TOC Transition Note (Addendum)
 Transition of Care (TOC) - Discharge Note Rayfield Gobble RN, BSN Inpatient Care Management Unit 4E- RN Case Manager See Treatment Team for direct phone #   Patient Details  Name: Donald Palmer MRN: [id number] Date of Birth: 1961-05-08  Transition of Care [facility]) CM/SW Contact:  Gobble Rayfield Hurst, RN Phone Number: 09/17/2024, 2:35 PM   Clinical Narrative:    Pt stable for transition home, per PTOT evals no followup for [facility] or DME needs.   CM notified by discharge nurse that wife is requesting RW and BSC for home.  No DME recommendations per PT/OT with notes saying pt already has DME at home.   CM to bedside to speak with pt and family (wife and daughter present). Per pt/family pt has rollator for home not RW and they feel pt need RW instead- explained to them that insurance will only cover one or the other once every 5 yrs- pt reports he just received rollator. With regards to the [facility]- pt/family report that pt actually does not have BSC- and is owed one - per wife on his last admit- he was using hers but no longer has that at home and voiced that [facility] was recommended last time so they owe him one- CM explained that MD still has to place an order for DME and agency has to process it to see if insurance will cover- explained that pt may still have out of pocket cost for [facility]. Pt stating he can purchase one if needed.   Pt/family requesting order for [facility] and that is be delivered to home. CM will ask MD for DME order and send to DME provider to process for insurance coverage- per pt/family they do not have a preference for provider.   Referral for DME- BSC called to [employer] liaison- [employer] to process for insurance coverage and will contact pt or wife for any out of pocket cost. DME to be delivered to home per pt request.   Family transporting pt home  IP CM interventions completed. No further needs noted.    Final next level of care: Home/Self Care Barriers to Discharge: No Barriers  Identified   Patient Goals and CMS Choice Patient states their goals for this hospitalization and ongoing recovery are:: return home   Choice offered to / list presented to : Patient, Spouse, Adult Children      Discharge Placement               Home        Discharge Plan and Services Additional resources added to the After Visit Summary for     Discharge Planning Services: CM Consult Post Acute Care Choice: Durable Medical Equipment          DME Arranged: Bedside commode DME Agency: [employer] Date DME Agency Contacted: 09/17/24 Time DME Agency Contacted: 1435 Representative spoke with at DME Agency: Zack HH Arranged: NA HH Agency: NA        Social Drivers of Health (SDOH) Interventions SDOH Screenings   Food Insecurity: No Food Insecurity (09/14/2024)  Housing: Low Risk  (09/14/2024)  Transportation Needs: No Transportation Needs (09/14/2024)  Utilities: Not At Risk (09/14/2024)  Alcohol  Screen: Low Risk  (02/05/2019)  Depression (PHQ2-9): Low Risk  (04/29/2024)  Financial Resource Strain: Medium Risk (01/06/2024)  Tobacco Use: Medium Risk (09/15/2024)  Health Literacy: Adequate Health Literacy (01/06/2024)     Readmission Risk Interventions    09/17/2024    2:35 PM  Readmission Risk Prevention Plan  Post Dischage Appt Complete  Medication Screening Complete  Transportation Screening Complete

## 2024-09-17 NOTE — Discharge Summary (Signed)
 Bypass Discharge Summary [name] [id number] 63 y.o. August 01, 1961  Admit date: 09/14/2024  Discharge date and time: 09/17/2024  2:32 PM   Admitting Physician: Penne Lonni Colorado, MD   Discharge Physician: same  Admission Diagnoses: PAD (peripheral artery disease) [I73.9]  Discharge Diagnoses: same  Admission Condition: fair  Discharged Condition: fair  Indication for Admission: Postoperative care after bypass surgery  Hospital Course: Donald Palmer is a 63 year old male found to have occluded left SFA stents.  Because of this he was experiencing rest pain in his left lower extremity.  He underwent diagnostic angiography on 09/14/2024 by Dr. Colorado.  The following day he underwent left femoral endarterectomy with bovine patch angioplasty and femoral to above-the-knee popliteal bypass with PTFE by Dr. Serene.  He tolerated the procedure well and was admitted to the hospital postoperatively.  POD #1 he had a palpable PT pulse which he maintained throughout his hospital stay.  Recommendations from PT included bedside commode which will be delivered to his house.  His incisions were well-appearing at the time of discharge.  He will follow-up in the office in 2 to 3 weeks.  He was prescribed 2 to 3 days of narcotic pain medication for continued postoperative pain control.  He was discharged home in stable condition.  Consults: None  Treatments: surgery: Aortogram with left leg runoff by Dr. Colorado on 09/14/2024.  Left femoral endarterectomy with bovine patch angioplasty with femoral to above-the-knee popliteal bypass with PTFE by Dr. Serene on 09/15/2024    Disposition: Discharge disposition: 01-Home or Self Care       - For [facility] Registry use ---  Post-op:  Wound infection: No  Graft infection: No  Transfusion: No   New Arrhythmia: No Patency judged by: [ ]  Dopper only, [ ]  Palpable graft pulse, [ x] Palpable distal pulse, [ ]  ABI inc. > 0.15, [ ]  Duplex D/C  Ambulatory Status: Ambulatory  Complications: MI: [ x] No, [ ]  Troponin only, [ ]  EKG or Clinical CHF: No Resp failure: [x ] none, [ ]  Pneumonia, [ ]  Ventilator Chg in renal function: [ x] none, [ ]  Inc. Cr > 0.5, [ ]  Temp. Dialysis, [ ]  Permanent dialysis Stroke: [x ] None, [ ]  Minor, [ ]  Major Return to OR: No  Reason for return to OR: [ ]  Bleeding, [ ]  Infection, [ ]  Thrombosis, [ ]  Revision  Discharge medications: Statin use:  Yes ASA use:  Yes Plavix  use:  Yes Beta blocker use: No  for medical reason not indicated Coumadin use: Yes    Patient Instructions:  Allergies as of 09/17/2024       Reactions   Metformin  And Related Other (See Comments)   GI side effects. Stopped 2016        Medication List     TAKE these medications    Accu-Chek Guide Test test strip Generic drug: glucose blood Use to test blood glucose 2 times daily.   Accu-Chek Guide w/Device Kit Check blood sugar 2 times a day   Accu-Chek Softclix Lancets lancets check blood sugar 2 times a day   acetaminophen  500 MG tablet Commonly known as: TYLENOL  Take 1,000 mg by mouth every 6 (six) hours as needed for mild pain (pain score 1-3) or headache.   atorvastatin  80 MG tablet Commonly known as: LIPITOR  Take 1 tablet (80 mg total) by mouth daily at 6 PM.   cilostazol  50 MG tablet Commonly known as: PLETAL  Take 1 tablet (50 mg total)  by mouth 2 (two) times daily.   clopidogrel  75 MG tablet Commonly known as: PLAVIX  Take 1 tablet (75 mg total) by mouth daily.   colchicine  0.6 MG tablet Take 1 tablet (0.6 mg total) by mouth daily as needed (gout flare).   DULoxetine  30 MG capsule Commonly known as: Cymbalta  Take 1 capsule (30 mg total) by mouth daily.   Eliquis  5 MG Tabs tablet Generic drug: apixaban  TAKE 1 TABLET(5 MG) BY MOUTH TWICE DAILY   losartan  50 MG tablet Commonly known as: COZAAR  Take 50 mg by mouth daily.   metoprolol  tartrate 25 MG tablet Commonly known as:  LOPRESSOR  Take 1 tablet (25 mg total) by mouth 2 (two) times daily.   oxyCODONE -acetaminophen  5-325 MG tablet Commonly known as: PERCOCET/ROXICET Take 1 tablet by mouth every 6 (six) hours as needed for severe pain (pain score 7-10). What changed: when to take this   Pen Needles 32G X 4 MM Misc Use to inject liraglutide  once a day   pioglitazone  30 MG tablet Commonly known as: Actos  Take 1 tablet (30 mg total) by mouth daily.   pregabalin  150 MG capsule Commonly known as: LYRICA  Take 150 mg by mouth 2 (two) times daily.   Semaglutide (0.25 or 0.5MG /DOS) 2 MG/3ML Sopn Inject 0.25 mg into the skin once a week.   tadalafil  10 MG tablet Commonly known as: Cialis  Take 1 tablet (10 mg total) by mouth as needed for erectile dysfunction.               Durable Medical Equipment  (From admission, onward)           Start     Ordered   09/17/24 1422  For home use only DME Bedside commode  Once       Question:  Patient needs a bedside commode to treat with the following condition  Answer:  PAD (peripheral artery disease)   09/17/24 1422           Activity: activity as tolerated Diet: regular diet Wound Care: keep wound clean and dry  Follow-up with [facility] in 3 weeks.  SignedBETHA Donnice Sender 09/17/2024 3:37 PM

## 2024-09-17 NOTE — Progress Notes (Signed)
 Discharge     Patient and wife at bedside.  Both expressed verbal understanding of discharge POC.  PIVs removed and dressings intact.  Patients wife requesting BSC and RW.  CM update and will speak with family at bedside.     Discharge completed  TOC meds ready.  SABRA

## 2024-09-17 NOTE — Discharge Instructions (Addendum)
 [facility]  Discharge instructions  Lower Extremity Bypass Surgery  Please refer to the following instruction for your post-procedure care. Your surgeon or physician assistant will discuss any changes with you.  Activity  You are encouraged to walk as much as you can. You can slowly return to normal activities during the month after your surgery. Avoid strenuous activity and heavy lifting until your doctor tells you it's OK. Avoid activities such as vacuuming or swinging a golf club. Do not drive until your doctor give the OK and you are no longer taking prescription pain medications. It is also normal to have difficulty with sleep habits, eating and bowel movement after surgery. These will go away with time.  Bathing/Showering  You may shower after you go home. Do not soak in a bathtub, hot tub, or swim until the incision heals completely.  Incision Care  Clean your incision with mild soap and water. Shower every day. Pat the area dry with a clean towel. You do not need a bandage unless otherwise instructed. Do not apply any ointments or creams to your incision. If you have open wounds you will be instructed how to care for them or a visiting nurse may be arranged for you. If you have staples or sutures along your incision they will be removed at your post-op appointment. You may have skin glue on your incision. Do not peel it off. It will come off on its own in about one week. If you have a great deal of moisture in your groin, use a gauze help keep this area dry.  Diet  Resume your normal diet. There are no special food restrictions following this procedure. A low fat/ low cholesterol diet is recommended for all patients with vascular disease. In order to heal from your surgery, it is CRITICAL to get adequate nutrition. Your body requires vitamins, minerals, and protein. Vegetables are the best source of vitamins and minerals. Vegetables also provide the  perfect balance of protein. Processed food has little nutritional value, so try to avoid this.  Medications  Resume taking all your medications unless your doctor or nurse practitioner tells you not to. If your incision is causing pain, you may take over-the-counter pain relievers such as acetaminophen (Tylenol). If you were prescribed a stronger pain medication, please aware these medication can cause nausea and constipation. Prevent nausea by taking the medication with a snack or meal. Avoid constipation by drinking plenty of fluids and eating foods with high amount of fiber, such as fruits, vegetables, and grains. Take Colase 100 mg (an over-the-counter stool softener) twice a day as needed for constipation. Do not take Tylenol if you are taking prescription pain medications.  Follow Up  Our office will schedule a follow up appointment 2-3 weeks following discharge.  Please call us immediately for any of the following conditions  Severe or worsening pain in your legs or feet while at rest or while walking Increase pain, redness, warmth, or drainage (pus) from your incision site(s) Fever of 101 degree or higher The swelling in your leg with the bypass suddenly worsens and becomes more painful than when you were in the hospital If you have been instructed to feel your graft pulse then you should do so every day. If you can no longer feel this pulse, call the office immediately. Not all patients are given this instruction.  Leg swelling is common after leg bypass surgery.  The swelling should improve over a few months  following surgery. To improve the swelling, you may elevate your legs above the level of your heart while you are sitting or resting. Your surgeon or physician assistant may ask you to apply an ACE wrap or wear compression (TED) stockings to help to reduce swelling.  Reduce your risk of vascular disease  Stop smoking. If you would like help call [facility] at 1-800-QUIT-NOW  ((470) 654-9347) or [place] at (807)887-0074.  Manage your cholesterol Maintain a desired weight Control your diabetes weight Control your diabetes Keep your blood pressure down  If you have any questions, please call the office at [phone]

## 2024-09-17 NOTE — Progress Notes (Addendum)
  Progress Note    09/17/2024 8:19 AM 2 Days Post-Op  Subjective:  no complaints   Vitals:   09/16/24 1942 09/16/24 2356  BP:  128/70  Pulse:    Resp: (!) 25 20  Temp:  98.5 F (36.9 C)  SpO2:     Physical Exam: Lungs:  non labored Incisions:  L groin and pop c/d/i Extremities:  palpable L PT Neurologic: A&O  CBC    Component Value Date/Time   WBC 15.9 (H) 09/17/2024 0324   RBC 5.62 09/17/2024 0324   HGB 12.1 (L) 09/17/2024 0324   HGB 11.0 (L) 12/02/2023 1442   HCT 39.2 09/17/2024 0324   HCT 35.7 (L) 12/02/2023 1442   PLT 284 09/17/2024 0324   PLT 728 (H) 12/02/2023 1442   MCV 69.8 (L) 09/17/2024 0324   MCV 69 (L) 12/02/2023 1442   MCH 21.5 (L) 09/17/2024 0324   MCHC 30.9 09/17/2024 0324   RDW 15.8 (H) 09/17/2024 0324   RDW 13.7 12/02/2023 1442   LYMPHSABS 2.7 09/06/2024 1456   LYMPHSABS 2.7 10/11/2023 1307   MONOABS 0.7 09/06/2024 1456   EOSABS 0.1 09/06/2024 1456   EOSABS 0.2 10/11/2023 1307   BASOSABS 0.1 09/06/2024 1456   BASOSABS 0.1 10/11/2023 1307    BMET    Component Value Date/Time   NA 135 09/15/2024 1304   NA 142 12/02/2023 1442   K 4.0 09/15/2024 1304   CL 100 09/15/2024 0528   CO2 26 09/15/2024 0528   GLUCOSE 115 (H) 09/15/2024 0528   BUN 7 (L) 09/15/2024 0528   BUN 7 (L) 12/02/2023 1442   CREATININE 1.18 09/15/2024 1827   CREATININE 0.77 10/28/2014 1112   CALCIUM  9.0 09/15/2024 0528   GFRNONAA >60 09/15/2024 1827   GFRNONAA >89 10/28/2014 1112   GFRAA 87 04/28/2020 0944   GFRAA >89 10/28/2014 1112    INR    Component Value Date/Time   INR 1.0 09/14/2024 2044     Intake/Output Summary (Last 24 hours) at 09/17/2024 0819 Last data filed at 09/16/2024 1708 Gross per 24 hour  Intake 770 ml  Output 750 ml  Net 20 ml     Assessment/Plan:  63 y.o. male is s/p L femoral to AK popliteal bypass with PTFE  2 Days Post-Op   LLE well perfused with palpable PT pulse Groin and leg incisions are healing well Unable to work with  therapy yesterday; try again today Home this afternoon pending therapy recommendations   Donnice Sender, PA-C [facility] [phone] 09/17/2024 8:19 AM  I have independently interviewed and examined patient and agree with PA assessment and plan above.  Pain much improved with palpable posterior tibial pulse.  He continues to have numbness of the foot which hopefully will improve with time.  Continue to work with physical therapy.  Will DC home when cleared.  Bailea Beed C. Sheree, MD [facility] [place] Office: [phone] Pager: [phone]

## 2024-09-17 NOTE — Plan of Care (Signed)
 Problem: Education: Goal: Knowledge of General Education information will improve Description: Including pain rating scale, medication(s)/side effects and non-pharmacologic comfort measures Outcome: Progressing   Problem: Health Behavior/Discharge Planning: Goal: Ability to manage health-related needs will improve Outcome: Progressing   Problem: Clinical Measurements: Goal: Ability to maintain clinical measurements within normal limits will improve Outcome: Progressing Goal: Will remain free from infection Outcome: Progressing Goal: Diagnostic test results will improve Outcome: Progressing Goal: Respiratory complications will improve Outcome: Progressing Goal: Cardiovascular complication will be avoided Outcome: Progressing   Problem: Activity: Goal: Risk for activity intolerance will decrease Outcome: Progressing   Problem: Nutrition: Goal: Adequate nutrition will be maintained Outcome: Progressing   Problem: Coping: Goal: Level of anxiety will decrease Outcome: Progressing   Problem: Elimination: Goal: Will not experience complications related to bowel motility Outcome: Progressing Goal: Will not experience complications related to urinary retention Outcome: Progressing   Problem: Pain Managment: Goal: General experience of comfort will improve and/or be controlled Outcome: Progressing   Problem: Safety: Goal: Ability to remain free from injury will improve Outcome: Progressing   Problem: Skin Integrity: Goal: Risk for impaired skin integrity will decrease Outcome: Progressing   Problem: Education: Goal: Ability to describe self-care measures that may prevent or decrease complications (Diabetes Survival Skills Education) will improve Outcome: Progressing Goal: Individualized Educational Video(s) Outcome: Progressing   Problem: Coping: Goal: Ability to adjust to condition or change in health will improve Outcome: Progressing   Problem: Fluid  Volume: Goal: Ability to maintain a balanced intake and output will improve Outcome: Progressing   Problem: Health Behavior/Discharge Planning: Goal: Ability to identify and utilize available resources and services will improve Outcome: Progressing Goal: Ability to manage health-related needs will improve Outcome: Progressing   Problem: Metabolic: Goal: Ability to maintain appropriate glucose levels will improve Outcome: Progressing   Problem: Nutritional: Goal: Maintenance of adequate nutrition will improve Outcome: Progressing Goal: Progress toward achieving an optimal weight will improve Outcome: Progressing   Problem: Skin Integrity: Goal: Risk for impaired skin integrity will decrease Outcome: Progressing   Problem: Tissue Perfusion: Goal: Adequacy of tissue perfusion will improve Outcome: Progressing   Problem: Education: Goal: Understanding of CV disease, CV risk reduction, and recovery process will improve Outcome: Progressing Goal: Individualized Educational Video(s) Outcome: Progressing   Problem: Activity: Goal: Ability to return to baseline activity level will improve Outcome: Progressing   Problem: Cardiovascular: Goal: Ability to achieve and maintain adequate cardiovascular perfusion will improve Outcome: Progressing Goal: Vascular access site(s) Level 0-1 will be maintained Outcome: Progressing   Problem: Health Behavior/Discharge Planning: Goal: Ability to safely manage health-related needs after discharge will improve Outcome: Progressing   Problem: Education: Goal: Knowledge of prescribed regimen will improve Outcome: Progressing   Problem: Education: Goal: Ability to describe self-care measures that may prevent or decrease complications (Diabetes Survival Skills Education) will improve Outcome: Progressing Goal: Individualized Educational Video(s) Outcome: Progressing   Problem: Coping: Goal: Ability to adjust to condition or change in  health will improve Outcome: Progressing   Problem: Fluid Volume: Goal: Ability to maintain a balanced intake and output will improve Outcome: Progressing   Problem: Health Behavior/Discharge Planning: Goal: Ability to identify and utilize available resources and services will improve Outcome: Progressing Goal: Ability to manage health-related needs will improve Outcome: Progressing   Problem: Metabolic: Goal: Ability to maintain appropriate glucose levels will improve Outcome: Progressing   Problem: Nutritional: Goal: Maintenance of adequate nutrition will improve Outcome: Progressing Goal: Progress toward achieving an optimal weight will improve Outcome: Progressing  Problem: Skin Integrity: Goal: Risk for impaired skin integrity will decrease Outcome: Progressing   Problem: Tissue Perfusion: Goal: Adequacy of tissue perfusion will improve Outcome: Progressing

## 2024-09-17 NOTE — Evaluation (Signed)
 Physical Therapy Evaluation Patient Details Name: Donald Palmer MRN: [id number] DOB: 08/27/1961 Today's Date: 09/17/2024  History of Present Illness  Pt is 63 yo presenting to [facility] on 9/29 for popliteal bypass. Pmh: chronic back pain, CAD, DM, Hep C, HTN, microcytic anemia, neuromuscular disorder, PAD, pancreatitis.  Clinical Impression  Patient is s/p above surgery resulting in functional limitations due to the deficits listed below (see PT Problem List). Independent PTA. Has a rollator. Able to transfer and ambulate at a supervision level >100 feet today without loss of balance or buckling. Educated on precautions post-op, LE exercises, and safety with mobility. Gait symmetry improved with distance and intermittent cues for awareness. Will follow acutely until d/c. May benefit from OPPT follow-up to help regain full strength/function. Patient will benefit from acute skilled PT to increase their independence and safety with mobility to facilitate discharge.         If plan is discharge home, recommend the following: Assistance with cooking/housework;Assist for transportation   Can travel by private vehicle        Equipment Recommendations None recommended by PT  Recommendations for Other Services       Functional Status Assessment Patient has had a recent decline in their functional status and demonstrates the ability to make significant improvements in function in a reasonable and predictable amount of time.     Precautions / Restrictions Precautions Precautions: Fall Recall of Precautions/Restrictions: Intact Restrictions Weight Bearing Restrictions Per Provider Order: No      Mobility  Bed Mobility Overal bed mobility: Modified Independent             General bed mobility comments: Mod I , extra time    Transfers Overall transfer level: Needs assistance Equipment used: Rollator (4 wheels) Transfers: Sit to/from Stand Sit to Stand: Supervision           General  transfer comment: Supervision for safety, extra time to position and cues for hand placement to rise. Slow and guarded but stable once upright. Able to perform from bed and rollator.    Ambulation/Gait Ambulation/Gait assistance: Supervision Gait Distance (Feet): 110 Feet Assistive device: Rollator (4 wheels) Gait Pattern/deviations: Step-through pattern, Decreased stride length, Antalgic, Trunk flexed Gait velocity: dec Gait velocity interpretation: <1.8 ft/sec, indicate of risk for recurrent falls   General Gait Details: Educated on proper AD use with rollator, including break application. Navigates in room and hallway at supervision level wtihout overt buckling or LOB. Shows antalgic patterning, slower speed and reduced step length. Cues for upright posture and gait symmetry improved with distance.  Stairs            Wheelchair Mobility     Tilt Bed    Modified Rankin (Stroke Patients Only)       Balance Overall balance assessment: Mild deficits observed, not formally tested                                           Pertinent Vitals/Pain Pain Assessment Pain Assessment: Faces Faces Pain Scale: Hurts little more Pain Descriptors / Indicators: Discomfort, Grimacing, Operative site guarding Pain Intervention(s): Limited activity within patient's tolerance, Monitored during session, Premedicated before session    Home Living Family/patient expects to be discharged to:: Private residence Living Arrangements: Spouse/significant other;Children Available Help at Discharge: Family Type of Home: Apartment Home Access: Level entry       Home Layout:  One level Home Equipment: Rollator (4 wheels);Cane - single point;BSC/3in1;Shower seat      Prior Function Prior Level of Function : Independent/Modified Independent             Mobility Comments: Use of SPC and rollator ADLs Comments: Ind     Extremity/Trunk Assessment   Upper Extremity  Assessment Upper Extremity Assessment: Defer to OT evaluation    Lower Extremity Assessment Lower Extremity Assessment: Generalized weakness    Cervical / Trunk Assessment Cervical / Trunk Assessment: Normal  Communication   Communication Communication: No apparent difficulties    Cognition Arousal: Alert Behavior During Therapy: WFL for tasks assessed/performed   PT - Cognitive impairments: No apparent impairments                         Following commands: Intact       Cueing Cueing Techniques: Verbal cues     General Comments General comments (skin integrity, edema, etc.): HR 112 with activity.    Exercises General Exercises - Lower Extremity Ankle Circles/Pumps: AROM, Both, 10 reps, Supine Quad Sets: Strengthening, Both, 10 reps, Supine Long Arc Quad: Strengthening, Both, 10 reps, Seated   Assessment/Plan    PT Assessment Patient needs continued PT services  PT Problem List Decreased strength;Decreased range of motion;Decreased activity tolerance;Decreased balance;Decreased mobility;Decreased knowledge of precautions;Pain       PT Treatment Interventions DME instruction;Gait training;Functional mobility training;Therapeutic activities;Therapeutic exercise;Balance training;Neuromuscular re-education;Patient/family education;Modalities    PT Goals (Current goals can be found in the Care Plan section)  Acute Rehab PT Goals Patient Stated Goal: Go home today PT Goal Formulation: With patient Time For Goal Achievement: 09/24/24 Potential to Achieve Goals: Good    Frequency Min 2X/week     Co-evaluation               AM-PAC PT 6 Clicks Mobility  Outcome Measure Help needed turning from your back to your side while in a flat bed without using bedrails?: None Help needed moving from lying on your back to sitting on the side of a flat bed without using bedrails?: None Help needed moving to and from a bed to a chair (including a wheelchair)?:  A Little Help needed standing up from a chair using your arms (e.g., wheelchair or bedside chair)?: A Little Help needed to walk in hospital room?: A Little Help needed climbing 3-5 steps with a railing? : A Little 6 Click Score: 20    End of Session Equipment Utilized During Treatment: Gait belt Activity Tolerance: Patient tolerated treatment well Patient left: in bed;with call bell/phone within reach;with bed alarm set Nurse Communication: Mobility status PT Visit Diagnosis: Unsteadiness on feet (R26.81);Other abnormalities of gait and mobility (R26.89);Muscle weakness (generalized) (M62.81);Difficulty in walking, not elsewhere classified (R26.2);Pain Pain - Right/Left: Left Pain - part of body: Leg    Time: 8872-8861 PT Time Calculation (min) (ACUTE ONLY): 11 min   Charges:   PT Evaluation $PT Eval Low Complexity: 1 Low   PT General Charges $$ ACUTE PT VISIT: 1 Visit         Leontine Roads, PT, DPT [facility]  Rehabilitation Services Physical Therapist Office: [phone] Website: [email or web address]   Leontine GORMAN Roads 09/17/2024, 1:48 PM

## 2024-09-17 NOTE — Plan of Care (Signed)

## 2024-09-17 NOTE — Evaluation (Signed)
 Occupational Therapy Evaluation Patient Details Name: Donald Palmer MRN: [id number] DOB: 1961-07-11 Today's Date: 09/17/2024   History of Present Illness   Pt is 63 yo presenting to [facility] on 9/29 for popliteal bypass. Pmh: chronic back pain, CAD, DM, Hep C, HTN, microcytic anemia, neuromuscular disorder, PAD, pancreatitis.     Clinical Impressions Pt admitted based on above, and was seen based on problem list below. PTA pt was independent with ADLs and IADLs. Today pt is requiring set up  to min assist for ADLs. Functional transfers are  s for safety with use of rollator. Pt primarily limited by pain, encouraged LLE AROM exercises to increase independence with LB ADLs. Pt with adequate family support available at home and will progress well, no follow up OT or DME needs. OT will continue to follow acutely to maximize functional independence.        If plan is discharge home, recommend the following:   A little help with walking and/or transfers;A little help with bathing/dressing/bathroom;Assist for transportation;Assistance with cooking/housework     Functional Status Assessment   Patient has had a recent decline in their functional status and demonstrates the ability to make significant improvements in function in a reasonable and predictable amount of time.     Equipment Recommendations   None recommended by OT      Precautions/Restrictions   Precautions Precautions: Fall Recall of Precautions/Restrictions: Intact Restrictions Weight Bearing Restrictions Per Provider Order: No     Mobility Bed Mobility Overal bed mobility: Modified Independent     General bed mobility comments: From flat bed, minimal use of rails    Transfers Overall transfer level: Needs assistance Equipment used: Rollator (4 wheels) Transfers: Sit to/from Stand Sit to Stand: Supervision           General transfer comment: S for safety, good use of brakes and safety features       Balance Overall balance assessment: Mild deficits observed, not formally tested       ADL either performed or assessed with clinical judgement   ADL Overall ADL's : Needs assistance/impaired Eating/Feeding: Set up;Sitting   Grooming: Oral care;Wash/dry face;Standing;Supervision/safety           Upper Body Dressing : Set up;Sitting   Lower Body Dressing: Sit to/from stand;Minimal assistance Lower Body Dressing Details (indicate cue type and reason): Assist for socks and shoes Toilet Transfer: Supervision/safety;Rollator (4 wheels);Ambulation;Regular Teacher, adult education Details (indicate cue type and reason): Use of GB, slow and painful but no assist Toileting- Clothing Manipulation and Hygiene: Supervision/safety;Sit to/from stand       Functional mobility during ADLs: Supervision/safety;Rollator (4 wheels) General ADL Comments: Primarily S for safety, limited by pain     Vision Baseline Vision/History: 0 No visual deficits Patient Visual Report: No change from baseline Vision Assessment?: No apparent visual deficits            Pertinent Vitals/Pain Pain Assessment Pain Assessment: 0-10 Pain Score: 8  Pain Location: Surgical site Pain Descriptors / Indicators: Discomfort, Grimacing Pain Intervention(s): Monitored during session, RN gave pain meds during session     Extremity/Trunk Assessment Upper Extremity Assessment Upper Extremity Assessment: Overall WFL for tasks assessed   Lower Extremity Assessment Lower Extremity Assessment: Defer to PT evaluation   Cervical / Trunk Assessment Cervical / Trunk Assessment: Normal   Communication Communication Communication: No apparent difficulties   Cognition Arousal: Alert Behavior During Therapy: WFL for tasks assessed/performed Cognition: No apparent impairments     Following commands: Intact  Cueing  General Comments   Cueing Techniques: Verbal cues  max HR during session 101            Home Living Family/patient expects to be discharged to:: Private residence Living Arrangements: Spouse/significant other;Children Available Help at Discharge: Family Type of Home: Apartment Home Access: Level entry     Home Layout: One level     Bathroom Shower/Tub: Chief Strategy Officer: Standard Bathroom Accessibility: No   Home Equipment: Rollator (4 wheels);Cane - single point;BSC/3in1;Shower seat          Prior Functioning/Environment Prior Level of Function : Independent/Modified Independent       Mobility Comments: Use of SPC and rollator ADLs Comments: Ind    OT Problem List: Decreased strength;Decreased range of motion;Decreased activity tolerance;Impaired balance (sitting and/or standing);Decreased safety awareness;Cardiopulmonary status limiting activity;Pain   OT Treatment/Interventions: Self-care/ADL training;Therapeutic exercise;Energy conservation;DME and/or AE instruction;Therapeutic activities;Patient/family education;Balance training      OT Goals(Current goals can be found in the care plan section)   Acute Rehab OT Goals Patient Stated Goal: To go home OT Goal Formulation: With patient Time For Goal Achievement: 10/01/24 Potential to Achieve Goals: Good   OT Frequency:  Min 2X/week       AM-PAC OT 6 Clicks Daily Activity     Outcome Measure Help from another person eating meals?: None Help from another person taking care of personal grooming?: A Little Help from another person toileting, which includes using toliet, bedpan, or urinal?: A Little Help from another person bathing (including washing, rinsing, drying)?: A Little Help from another person to put on and taking off regular upper body clothing?: A Little Help from another person to put on and taking off regular lower body clothing?: A Little 6 Click Score: 19   End of Session Equipment Utilized During Treatment: Rollator (4 wheels) Nurse Communication: Mobility  status  Activity Tolerance: Patient tolerated treatment well Patient left: in bed;with call bell/phone within reach  OT Visit Diagnosis: Unsteadiness on feet (R26.81);Other abnormalities of gait and mobility (R26.89);Muscle weakness (generalized) (M62.81)                Time: 9082-9062 OT Time Calculation (min): 20 min Charges:  OT General Charges $OT Visit: 1 Visit OT Evaluation $OT Eval Moderate Complexity: 1 Mod  Adrianne BROCKS, OT  Acute Rehabilitation Services Office [phone] Secure chat preferred   Adrianne GORMAN Savers 09/17/2024, 10:13 AM

## 2024-09-20 ENCOUNTER — Other Ambulatory Visit: Payer: Self-pay

## 2024-09-20 ENCOUNTER — Emergency Department (HOSPITAL_COMMUNITY)
Admission: EM | Admit: 2024-09-20 | Discharge: 2024-09-20 | Disposition: A | Discharge status: Home / Self Care | Attending: Emergency Medicine | Admitting: Emergency Medicine

## 2024-09-20 ENCOUNTER — Encounter (HOSPITAL_COMMUNITY): Payer: Self-pay

## 2024-09-20 ENCOUNTER — Emergency Department (HOSPITAL_COMMUNITY)

## 2024-09-20 DIAGNOSIS — I7090 Unspecified atherosclerosis: Secondary | ICD-10-CM

## 2024-09-20 DIAGNOSIS — F17210 Nicotine dependence, cigarettes, uncomplicated: Secondary | ICD-10-CM | POA: Insufficient documentation

## 2024-09-20 DIAGNOSIS — G8918 Other acute postprocedural pain: Secondary | ICD-10-CM | POA: Diagnosis not present

## 2024-09-20 DIAGNOSIS — I2510 Atherosclerotic heart disease of native coronary artery without angina pectoris: Secondary | ICD-10-CM | POA: Insufficient documentation

## 2024-09-20 DIAGNOSIS — E119 Type 2 diabetes mellitus without complications: Secondary | ICD-10-CM | POA: Insufficient documentation

## 2024-09-20 DIAGNOSIS — Z7901 Long term (current) use of anticoagulants: Secondary | ICD-10-CM | POA: Insufficient documentation

## 2024-09-20 DIAGNOSIS — R000 Tachycardia, unspecified: Secondary | ICD-10-CM | POA: Insufficient documentation

## 2024-09-20 DIAGNOSIS — Z794 Long term (current) use of insulin: Secondary | ICD-10-CM | POA: Insufficient documentation

## 2024-09-20 DIAGNOSIS — Z79899 Other long term (current) drug therapy: Secondary | ICD-10-CM | POA: Insufficient documentation

## 2024-09-20 DIAGNOSIS — Z95828 Presence of other vascular implants and grafts: Secondary | ICD-10-CM | POA: Diagnosis not present

## 2024-09-20 DIAGNOSIS — Z7984 Long term (current) use of oral hypoglycemic drugs: Secondary | ICD-10-CM | POA: Insufficient documentation

## 2024-09-20 DIAGNOSIS — I10 Essential (primary) hypertension: Secondary | ICD-10-CM | POA: Diagnosis not present

## 2024-09-20 DIAGNOSIS — M79605 Pain in left leg: Secondary | ICD-10-CM | POA: Insufficient documentation

## 2024-09-20 LAB — I-STAT CHEM 8, ED
BUN: 19 mg/dL (ref 8–23)
Calcium, Ion: 1.18 mmol/L (ref 1.15–1.40)
Chloride: 97 mmol/L — ABNORMAL LOW (ref 98–111)
Creatinine, Ser: 1.1 mg/dL (ref 0.61–1.24)
Glucose, Bld: 218 mg/dL — ABNORMAL HIGH (ref 70–99)
HCT: 32 % — ABNORMAL LOW (ref 39.0–52.0)
Hemoglobin: 10.9 g/dL — ABNORMAL LOW (ref 13.0–17.0)
Potassium: 4 mmol/L (ref 3.5–5.1)
Sodium: 134 mmol/L — ABNORMAL LOW (ref 135–145)
TCO2: 28 mmol/L (ref 22–32)

## 2024-09-20 LAB — CBC WITH DIFFERENTIAL/PLATELET
Abs Immature Granulocytes: 0.05 K/uL (ref 0.00–0.07)
Basophils Absolute: 0 K/uL (ref 0.0–0.1)
Basophils Relative: 0 %
Eosinophils Absolute: 0 K/uL (ref 0.0–0.5)
Eosinophils Relative: 0 %
HCT: 31.9 % — ABNORMAL LOW (ref 39.0–52.0)
Hemoglobin: 9.9 g/dL — ABNORMAL LOW (ref 13.0–17.0)
Immature Granulocytes: 1 %
Lymphocytes Relative: 16 %
Lymphs Abs: 1.5 K/uL (ref 0.7–4.0)
MCH: 21.7 pg — ABNORMAL LOW (ref 26.0–34.0)
MCHC: 31 g/dL (ref 30.0–36.0)
MCV: 69.8 fL — ABNORMAL LOW (ref 80.0–100.0)
Monocytes Absolute: 1 K/uL (ref 0.1–1.0)
Monocytes Relative: 10 %
Neutro Abs: 7.2 K/uL (ref 1.7–7.7)
Neutrophils Relative %: 73 %
Platelets: 366 K/uL (ref 150–400)
RBC: 4.57 MIL/uL (ref 4.22–5.81)
RDW: 15.3 % (ref 11.5–15.5)
Smear Review: NORMAL
WBC: 9.8 K/uL (ref 4.0–10.5)
nRBC: 0 % (ref 0.0–0.2)

## 2024-09-20 MED ORDER — OXYCODONE-ACETAMINOPHEN 5-325 MG PO TABS
1.0000 | ORAL_TABLET | Freq: Once | ORAL | Status: AC
  Administered 2024-09-20: 1 via ORAL
  Filled 2024-09-20: qty 1

## 2024-09-20 MED ORDER — ONDANSETRON HCL 4 MG/2ML IJ SOLN
4.0000 mg | Freq: Once | INTRAMUSCULAR | Status: AC
  Administered 2024-09-20: 4 mg via INTRAVENOUS
  Filled 2024-09-20: qty 2

## 2024-09-20 MED ORDER — OXYCODONE-ACETAMINOPHEN 5-325 MG PO TABS: 1.0000 | ORAL_TABLET | Freq: Four times a day (QID) | ORAL | 0 refills | Status: AC | PRN

## 2024-09-20 MED ORDER — HYDROMORPHONE HCL 1 MG/ML IJ SOLN
1.0000 mg | Freq: Once | INTRAMUSCULAR | Status: AC
  Administered 2024-09-20: 1 mg via INTRAVENOUS
  Filled 2024-09-20: qty 1

## 2024-09-20 NOTE — ED Triage Notes (Signed)
 Pt BIB [facility] from home d/t leg pain . Pt had a blood clot removed from his L leg couple of days ago. Pt is here d/t severe pain. Incision site does not look infected. L leg appears to be little swollen.

## 2024-09-20 NOTE — ED Provider Notes (Addendum)
 [place] EMERGENCY DEPARTMENT AT [facility] Provider Note   CSN: [id number] Arrival date & time: 09/20/24  9260     Patient presents with: Leg Pain   Donald Palmer is a 64 y.o. male.   Pt  is a 63 year old male who has hx of occluded left SFA stents and rest pain in his left lower extremity.  He underwent diagnostic angiography on 09/14/2024 by Dr. Sheree and had left femoral endarterectomy with bovine patch angioplasty and femoral to above-the-knee popliteal bypass with PTFE by Dr. Serene on 09/15/24.  Patient was discharged home on 09-17-24 and reports before leaving the hospital he was able to walk and pain was fairly manageable however starting last night around 4 he started having severe pain in his leg that is not controlled by pain medication.  It is painful to lift his leg or even try to walk.  The pain starts at his groin and goes all the way down the leg.  He has not noticed any color change in his leg or redness or weeping from his surgical wounds.  He reports he has been compliant with the blood thinners they were giving him since going home.  The history is provided by the patient and medical records.  Leg Pain      Prior to Admission medications   Medication Sig Start Date End Date Taking? Authorizing Provider  oxyCODONE -acetaminophen  (PERCOCET/ROXICET) 5-325 MG tablet Take 1 tablet by mouth every 6 (six) hours as needed for severe pain (pain score 7-10). 09/20/24  Yes Doretha Folks, MD  Accu-Chek Softclix Lancets lancets check blood sugar 2 times a day 06/05/21   Sherlean Failing, MD  acetaminophen  (TYLENOL ) 500 MG tablet Take 1,000 mg by mouth every 6 (six) hours as needed for mild pain (pain score 1-3) or headache.    [provider]  atorvastatin  (LIPITOR ) 80 MG tablet Take 1 tablet (80 mg total) by mouth daily at 6 PM. 01/30/24   Gregary Sharper, MD  Blood Glucose Monitoring Suppl (ACCU-CHEK GUIDE) w/Device KIT Check blood sugar 2 times a day 06/05/21    Sherlean Failing, MD  cilostazol  (PLETAL ) 50 MG tablet Take 1 tablet (50 mg total) by mouth 2 (two) times daily. 07/28/24   Court Dorn PARAS, MD  clopidogrel  (PLAVIX ) 75 MG tablet Take 1 tablet (75 mg total) by mouth daily. 01/30/24   Gregary Sharper, MD  colchicine  0.6 MG tablet Take 1 tablet (0.6 mg total) by mouth daily as needed (gout flare). 01/30/24   Gregary Sharper, MD  DULoxetine  (CYMBALTA ) 30 MG capsule Take 1 capsule (30 mg total) by mouth daily. 07/08/24   Tawkaliyar, Roya, DO  ELIQUIS  5 MG TABS tablet TAKE 1 TABLET(5 MG) BY MOUTH TWICE DAILY 04/28/24   Sheree Penne Bruckner, MD  glucose blood (ACCU-CHEK GUIDE TEST) test strip Use to test blood glucose 2 times daily. 08/13/24   Tobie Gaines, DO  Insulin  Pen Needle (PEN NEEDLES) 32G X 4 MM MISC Use to inject liraglutide  once a day 03/04/24   Harrie Bruckner, DO  losartan  (COZAAR ) 50 MG tablet Take 50 mg by mouth daily.    [provider]  metoprolol  tartrate (LOPRESSOR ) 25 MG tablet Take 1 tablet (25 mg total) by mouth 2 (two) times daily. 01/30/24   Gregary Sharper, MD  pioglitazone  (ACTOS ) 30 MG tablet Take 1 tablet (30 mg total) by mouth daily. 03/04/24 03/04/25  Harrie Bruckner, DO  pregabalin  (LYRICA ) 150 MG capsule Take 150 mg by mouth 2 (two) times  daily.    [provider]  Semaglutide ,0.25 or 0.5MG /DOS, 2 MG/3ML SOPN Inject 0.25 mg into the skin once a week. 07/08/24   Tawkaliyar, Roya, DO  tadalafil  (CIALIS ) 10 MG tablet Take 1 tablet (10 mg total) by mouth as needed for erectile dysfunction. 01/30/24 01/29/25  Gregary Sharper, MD    Allergies: Metformin  and related    Review of Systems  Gastrointestinal:        Patient reports he has not had a bowel movement since Tuesday    Updated Vital Signs BP 132/85 (BP Location: Right Arm)   Pulse 95   Temp 97.9 F (36.6 C) (Oral)   Resp 18   SpO2 98%   Physical Exam Vitals and nursing note reviewed.  Constitutional:      General: He is not in acute  distress.    Appearance: He is well-developed.  HENT:     Head: Normocephalic and atraumatic.  Eyes:     Conjunctiva/sclera: Conjunctivae normal.     Pupils: Pupils are equal, round, and reactive to light.  Cardiovascular:     Rate and Rhythm: Regular rhythm. Tachycardia present.     Heart sounds: No murmur heard. Pulmonary:     Effort: Pulmonary effort is normal. No respiratory distress.     Breath sounds: Normal breath sounds. No wheezing or rales.  Abdominal:     General: There is no distension.     Palpations: Abdomen is soft.     Tenderness: There is no abdominal tenderness. There is no guarding or rebound.  Musculoskeletal:        General: Tenderness present. Normal range of motion.     Cervical back: Normal range of motion and neck supple.     Comments: The left lower leg is warm to the touch and Doppler present pulses at the PT and DP pulse in the foot.  2 surgical scars noted on the medial thigh and the left groin are intact without any redness or drainage.  Left groin surgical site with induration, ecchymosis and palpable hematoma.  Medial thigh surgical site is soft with minimal erythema  Skin:    General: Skin is warm and dry.     Findings: No erythema or rash.  Neurological:     Mental Status: He is alert and oriented to person, place, and time.     Comments: Patient is able to move his toes without any difficulty.  He is having difficulty lifting his left leg suspect more related to pain  Psychiatric:        Behavior: Behavior normal.     (all labs ordered are listed, but only abnormal results are displayed) Labs Reviewed  CBC WITH DIFFERENTIAL/PLATELET - Abnormal; Notable for the following components:      Result Value   Hemoglobin 9.9 (*)    HCT 31.9 (*)    MCV 69.8 (*)    MCH 21.7 (*)    All other components within normal limits  I-STAT CHEM 8, ED - Abnormal; Notable for the following components:   Sodium 134 (*)    Chloride 97 (*)    Glucose, Bld 218 (*)     Hemoglobin 10.9 (*)    HCT 32.0 (*)    All other components within normal limits    EKG: None  Radiology: No results found.   Procedures   Medications Ordered in the ED  HYDROmorphone  (DILAUDID ) injection 1 mg (has no administration in time range)  oxyCODONE -acetaminophen  (PERCOCET/ROXICET) 5-325 MG per tablet 1  tablet (has no administration in time range)  HYDROmorphone  (DILAUDID ) injection 1 mg (1 mg Intravenous Given 09/20/24 0819)  ondansetron  (ZOFRAN ) injection 4 mg (4 mg Intravenous Given 09/20/24 0819)                                    Medical Decision Making Amount and/or Complexity of Data Reviewed Labs: ordered. Decision-making details documented in ED Course. Radiology: ordered and independent interpretation performed. Decision-making details documented in ED Course.  Risk Prescription drug management.   Pt with multiple medical problems and comorbidities and presenting today with a complaint that caries a high risk for morbidity and mortality.  Here today due to worsening left sided leg pain.  Patient with a recent vascular surgery for occluded SFA.  He reports being compliant with his anticoagulation.  Patient's foot is warm and appears to have good flow.  Unclear the source of patient's severe pain.  Question whether he just is not having good pain control versus hematoma that may be expanding at his surgical site.  There is no signs of infection at this time and he denies any infectious symptoms.  Dopplers with good flow noted in the foot.  Will discuss with vascular surgery for other recommendations.  Spoke with Dr. Pearline with vascular and he wants to r/o a pseudoaneurysm with duplex and he will see the pt.   1:16 PM I independently interpreted patient's labs and Chem-8 with preserved renal function, CBC with with some mild anemia today most likely related to recent procedure but will need to be followed. Duplex is neg for pseudoaneurysm or occlusion.  Dr.  Pearline had evaluated the patient and felt that if his ultrasound was negative his pain is most likely related to reperfusion and he needs another prescription for pain medication.  On repeat evaluation patient was starting to have pain come back and he was given another dose of pain medication which she has only required 2 doses and his 5-hour stay here.  At this time patient appears to be stable for discharge home and has follow-up with vascular surgery already scheduled.     Final diagnoses:  Left leg pain    ED Discharge Orders          Ordered    oxyCODONE -acetaminophen  (PERCOCET/ROXICET) 5-325 MG tablet  Every 6 hours PRN        09/20/24 1316               Doretha Folks, MD 09/20/24 1316    Doretha Folks, MD 09/20/24 1317

## 2024-09-20 NOTE — Discharge Instructions (Addendum)
 Thankfully today there is no sign of any infection at your surgical sites, there are no blood clots in your graft or signs of aneurysm.  Everything looks good and the pain you are experiencing is most likely related to the surgery and getting blood flow black into that area of your body which can be very painful.  Continue to take your pain medication at home and follow-up with the vascular surgery office as planned.  A new prescription for pain medication was sent to the pharmacy that you can fill if you run out of your other pain medication.

## 2024-09-20 NOTE — Consult Note (Signed)
 Hospital Consult    Reason for Consult: Left leg pain after her bypass about a week ago  MRN #:  [id number]  History of Present Illness: This is a 63 y.o. male with a history of PAD and occluded SFA stents who underwent left femoral endarterectomy with patch angioplasty and femoral to above-knee popliteal artery bypass with 6 m PTFE with Dr. Serene on 08/19/2024.  He was discharged on 10/2.  He presented to the ED this morning due to significant pain in his left thigh and groin.  He also describes throbbing pain in his foot. He denies drainage or fevers.  Past Medical History:  Diagnosis Date   Back pain, chronic    Coronary artery disease    Diabetes mellitus 2007   type 2   ED (erectile dysfunction)    tx w/cialis    Hepatitis C 03/2015   [id number]   History of chronic hepatitis C 05/13/2012   First noted 2013. RF inc tattoos obtained while incarcerated. [place]  2016 SVR 2017    Hypertension goal BP (blood pressure) < 140/80    Microcytic anemia    Neuromuscular disorder (HCC)    PAD (peripheral artery disease)    Pancreatitis 01/09/2012    Past Surgical History:  Procedure Laterality Date   ABDOMINAL AORTOGRAM N/A 03/03/2018   Procedure: ABDOMINAL AORTOGRAM;  Surgeon: Court Dorn PARAS, MD;  Location: [facility] INVASIVE CV LAB;  Service: Cardiovascular;  Laterality: N/A;   ABDOMINAL AORTOGRAM N/A 09/14/2024   Procedure: ABDOMINAL AORTOGRAM;  Surgeon: Sheree Penne Bruckner, MD;  Location: [facility] INVASIVE CV LAB;  Service: Cardiovascular;  Laterality: N/A;   ABDOMINAL AORTOGRAM W/LOWER EXTREMITY N/A 06/15/2019   Procedure: ABDOMINAL AORTOGRAM W/LOWER EXTREMITY;  Surgeon: Court Dorn PARAS, MD;  Location: [facility] INVASIVE CV LAB;  Service: Cardiovascular;  Laterality: N/A;   ABDOMINAL AORTOGRAM W/LOWER EXTREMITY N/A 09/09/2023   Procedure: ABDOMINAL AORTOGRAM W/LOWER EXTREMITY;  Surgeon: Sheree Penne Bruckner, MD;  Location: [facility] INVASIVE CV LAB;  Service: Cardiovascular;  Laterality: N/A;    ABDOMINAL AORTOGRAM W/LOWER EXTREMITY N/A 02/24/2024   Procedure: ABDOMINAL AORTOGRAM W/LOWER EXTREMITY;  Surgeon: Sheree Penne Bruckner, MD;  Location: [facility] INVASIVE CV LAB;  Service: Cardiovascular;  Laterality: N/A;   ABDOMINAL AORTOGRAM W/LOWER EXTREMITY N/A 03/18/2024   Procedure: ABDOMINAL AORTOGRAM W/LOWER EXTREMITY;  Surgeon: Lanis Fonda BRAVO, MD;  Location: [facility] INVASIVE CV LAB;  Service: Cardiovascular;  Laterality: N/A;   CORONARY ARTERY BYPASS GRAFT N/A 11/13/2023   Procedure: CORONARY ARTERY BYPASS GRAFTING X 3, USING LEFT INTERNAL MAMMARY ARTERY AND ENDOSCOPICALLY HARVESTED RIGHT SAPHENOUS VEIN GRAFT;  Surgeon: Shyrl Linnie KIDD, MD;  Location: [facility] OR;  Service: Open Heart Surgery;  Laterality: N/A;   ENDARTERECTOMY FEMORAL Left 09/15/2024   Procedure: ENDARTERECTOMY, FEMORAL;  Surgeon: Serene Gaile ORN, MD;  Location: [facility] OR;  Service: Vascular;  Laterality: Left;   FEMORAL-POPLITEAL BYPASS GRAFT Left 09/15/2024   Procedure: BYPASS GRAFT FEMORAL-ABOVE KNEE POPLITEAL ARTERY USING 6mm PROPATEN REMOVABLE RING GRAFT;  Surgeon: Serene Gaile ORN, MD;  Location: [facility] OR;  Service: Vascular;  Laterality: Left;   I & D EXTREMITY Right 05/15/2016   Procedure: RIGHT INDEX FINGER FIRST THROUGH  MIDDLE PHALYNX  AMPUTATION;  Surgeon: Alm Hummer, MD;  Location: [facility] OR;  Service: Orthopedics;  Laterality: Right;   LEFT HEART CATH AND CORONARY ANGIOGRAPHY N/A 10/16/2023   Procedure: LEFT HEART CATH AND CORONARY ANGIOGRAPHY;  Surgeon: Elmira Newman PARAS, MD;  Location: [facility] INVASIVE CV LAB;  Service: Cardiovascular;  Laterality: N/A;   LOWER EXTREMITY ANGIOGRAPHY Bilateral 09/14/2024  Procedure: Lower Extremity Angiography;  Surgeon: Sheree Penne Bruckner, MD;  Location: [facility] INVASIVE CV LAB;  Service: Cardiovascular;  Laterality: Bilateral;   LOWER EXTREMITY INTERVENTION Bilateral 03/03/2018   Procedure: LOWER EXTREMITY INTERVENTION;  Surgeon: Court Dorn PARAS, MD;  Location: [facility] INVAS[facility] CV LAB;   Service: Cardiovascular;  Laterality: Bilateral;   LOWER EXTREMITY INTERVENTION  02/24/2024   Procedure: LOWER EXTREMITY INTERVENTION;  Surgeon: Sheree Penne Bruckner, MD;  Location: [facility] INVASIVE CV LAB;  Service: Cardiovascular;;   LOWER EXTREMITY INTERVENTION  03/18/2024   Procedure: LOWER EXTREMITY INTERVENTION;  Surgeon: Lanis Fonda BRAVO, MD;  Location: [facility] INVASIVE CV LAB;  Service: Cardiovascular;;   OPEN REDUCTION INTERNAL FIXATION (ORIF) DISTAL PHALANX Right 04/02/2016   Procedure: RIGHT INDEX FINGER REPAIR;  Surgeon: Alm Hummer, MD;  Location: [facility];  Service: Orthopedics;  Laterality: Right;   PATCH ANGIOPLASTY Left 09/15/2024   Procedure: PATCH ANGIOPLASTY DENTON SARTORIUS BIOLOGIC PATCH;  Surgeon: Serene Gaile ORN, MD;  Location: [facility] OR;  Service: Vascular;  Laterality: Left;   PERIPHERAL VASCULAR ATHERECTOMY Left 03/03/2018   Procedure: PERIPHERAL VASCULAR ATHERECTOMY;  Surgeon: Court Dorn PARAS, MD;  Location: [facility];  Service: Cardiovascular;  Laterality: Left;  SFA WITH PTA DRUG COATED BALLOON   PERIPHERAL VASCULAR INTERVENTION Left 06/15/2019   Procedure: PERIPHERAL VASCULAR INTERVENTION;  Surgeon: Court Dorn PARAS, MD;  Location: [facility] INVASIVE CV LAB;  Service: Cardiovascular;  Laterality: Left;   PERIPHERAL VASCULAR THROMBECTOMY N/A 03/19/2024   Procedure: PERIPHERAL VASCULAR THROMBECTOMY;  Surgeon: Gretta Bruckner PARAS, MD;  Location: [facility] INVASIVE CV LAB;  Service: Cardiovascular;  Laterality: N/A;   TEE WITHOUT CARDIOVERSION N/A 11/13/2023   Procedure: TRANSESOPHAGEAL ECHOCARDIOGRAM;  Surgeon: Shyrl Linnie KIDD, MD;  Location: [facility] OR;  Service: Open Heart Surgery;  Laterality: N/A;    Allergies  Allergen Reactions   Metformin  And Related Other (See Comments)    GI side effects. Stopped 2016    Prior to Admission medications   Medication Sig Start Date End Date Taking? Authorizing Provider  Accu-Chek Softclix Lancets lancets check blood sugar  2 times a day 06/05/21   Sherlean Failing, MD  acetaminophen  (TYLENOL ) 500 MG tablet Take 1,000 mg by mouth every 6 (six) hours as needed for mild pain (pain score 1-3) or headache.    [provider]  atorvastatin  (LIPITOR ) 80 MG tablet Take 1 tablet (80 mg total) by mouth daily at 6 PM. 01/30/24   Gregary Sharper, MD  Blood Glucose Monitoring Suppl (ACCU-CHEK GUIDE) w/Device KIT Check blood sugar 2 times a day 06/05/21   Sherlean Failing, MD  cilostazol  (PLETAL ) 50 MG tablet Take 1 tablet (50 mg total) by mouth 2 (two) times daily. 07/28/24   Court Dorn PARAS, MD  clopidogrel  (PLAVIX ) 75 MG tablet Take 1 tablet (75 mg total) by mouth daily. 01/30/24   Gregary Sharper, MD  colchicine  0.6 MG tablet Take 1 tablet (0.6 mg total) by mouth daily as needed (gout flare). 01/30/24   Gregary Sharper, MD  DULoxetine  (CYMBALTA ) 30 MG capsule Take 1 capsule (30 mg total) by mouth daily. 07/08/24   Tawkaliyar, Roya, DO  ELIQUIS  5 MG TABS tablet TAKE 1 TABLET(5 MG) BY MOUTH TWICE DAILY 04/28/24   Sheree Penne Bruckner, MD  glucose blood (ACCU-CHEK GUIDE TEST) test strip Use to test blood glucose 2 times daily. 08/13/24   Tobie Gaines, DO  Insulin  Pen Needle (PEN NEEDLES) 32G X 4 MM MISC Use to inject liraglutide  once a day 03/04/24   Harrie Bruckner,  DO  losartan  (COZAAR ) 50 MG tablet Take 50 mg by mouth daily.    [provider]  metoprolol  tartrate (LOPRESSOR ) 25 MG tablet Take 1 tablet (25 mg total) by mouth 2 (two) times daily. 01/30/24   Gregary Sharper, MD  oxyCODONE -acetaminophen  (PERCOCET/ROXICET) 5-325 MG tablet Take 1 tablet by mouth every 6 (six) hours as needed for severe pain (pain score 7-10). 09/17/24   Bethanie Cough, PA-C  pioglitazone  (ACTOS ) 30 MG tablet Take 1 tablet (30 mg total) by mouth daily. 03/04/24 03/04/25  Harrie Bruckner, DO  pregabalin  (LYRICA ) 150 MG capsule Take 150 mg by mouth 2 (two) times daily.    [provider]  Semaglutide ,0.25 or 0.5MG /DOS, 2  MG/3ML SOPN Inject 0.25 mg into the skin once a week. 07/08/24   Tawkaliyar, Roya, DO  tadalafil  (CIALIS ) 10 MG tablet Take 1 tablet (10 mg total) by mouth as needed for erectile dysfunction. 01/30/24 01/29/25  Gregary Sharper, MD    Social History   Socioeconomic History   Marital status: Single    Spouse name: Not on file   Number of children: 4   Years of education: Not on file   Highest education level: 12th grade  Occupational History   Not on file  Tobacco Use   Smoking status: Former    Current packs/day: 0.10    Average packs/day: 0.1 packs/day for 30.0 years (3.0 ttl pk-yrs)    Types: Cigarettes, Cigars   Smokeless tobacco: Never   Tobacco comments:    Per pt, 3 cigarettes per day, as of 11/11/23  Vaping Use   Vaping status: Never Used  Substance and Sexual Activity   Alcohol  use: Not Currently    Alcohol /week: 5.0 standard drinks of alcohol     Types: 5 Cans of beer per week    Comment: Beer.   Drug use: Not Currently    Frequency: 1.0 times per week    Types: Marijuana    Comment: rarely   Sexual activity: Yes  Other Topics Concern   Not on file  Social History Narrative   Current Social History 08/15/2021        Patient lives with his daughter in a home which is 1 story. There are 8 steps up to the entrance the patient uses with a railing      Patient's method of transportation is personal car.      The highest level of education was some high school.      The patient currently disabled.      Identified important Relationships are my daughters and grandkids       Pets : 0       Interests / Fun: Web designer       Current Stressors: money       Religious / Personal Beliefs: no       Social Drivers of Corporate investment banker Strain: Medium Risk (01/06/2024)   Overall Financial Resource Strain (CARDIA)    Difficulty of Paying Living Expenses: Somewhat hard  Food Insecurity: No Food Insecurity (09/14/2024)   Hunger Vital Sign     Worried About Running Out of Food in the Last Year: Never true    Ran Out of Food in the Last Year: Never true  Transportation Needs: No Transportation Needs (09/14/2024)   PRAPARE - Administrator, Civil Service (Medical): No    Lack of Transportation (Non-Medical): No  Physical Activity: Not on file  Stress: Not on file  Social  Connections: Not on file  Intimate Partner Violence: Not At Risk (09/14/2024)   Humiliation, Afraid, Rape, and Kick questionnaire    Fear of Current or Ex-Partner: No    Emotionally Abused: No    Physically Abused: No    Sexually Abused: No    Family History  Problem Relation Age of Onset   Diabetes Mother    Hypertension Mother    Hyperlipidemia Mother    Asthma Sister    Obesity Sister    Diabetes Brother    Diabetes Brother    Diabetes Daughter    Heart attack Neg Hx    Sudden death Neg Hx     ROS: Otherwise negative unless mentioned in HPI  Physical Examination  Vitals:   09/20/24 0746 09/20/24 0806  BP: 132/85   Pulse: (!) 101 95  Resp: 18   Temp: 97.9 F (36.6 C)   SpO2: 100% 98%   There is no height or weight on file to calculate BMI.  General: no acute distress Cardiac: hemodynamically stable Neuro: alert, no focal deficit Extremities: some fullness in left groin but not palpable mass. Tenderness throughout tunnel site in thigh. Palpable DP multiphasic PT.    Data:   Left leg duplex ordered    ASSESSMENT/PLAN: This is a 63 y.o. male with PAD who recently underwent left femoral endarterectomy and left femoral to above-knee popliteal artery bypass with PTFE with Dr. Serene on 9/30.  He was discharged on 10/2 and presents to the emergency room today for significant increase in left groin and thigh pain.  No drainage or fevers. There are some fullness of the left groin incision.  Plan to obtain a arterial duplex and if there is no pseudoaneurysm or issue with the graft he can be discharged from the emergency department  with pain medication and continue with originally scheduled follow-up. Explained if there is no issues in the groin that his pain is likely incisional and from the tunnel which will improve and that the foot pain is most likely reperfusion pain as he has a palpable pulse.    Norman GORMAN Serve MD [facility] [phone] 09/20/2024  9:53 AM

## 2024-09-20 NOTE — Progress Notes (Signed)
 VASCULAR LAB    Left lower extremity duplex to evaluate  has been performed.  See CV proc for preliminary results.  Relayed results to Dr. Doretha via secure chat.  Boston Cookson, RVT 09/20/2024, 1:06 PM

## 2024-09-21 ENCOUNTER — Telehealth: Payer: Self-pay | Admitting: *Deleted

## 2024-09-21 ENCOUNTER — Telehealth: Payer: Self-pay

## 2024-09-21 NOTE — Telephone Encounter (Signed)
 Pt stated he needs something for the back Pain. Stated he picked up Oxycodone  for his leg (had surgery).  Went to the ER yesterday. Stated lying in bed is causing his back to  Suggested a telehealth appt but none available until next week. I told him I will send his concern to the doctor.

## 2024-09-21 NOTE — Telephone Encounter (Signed)
 Copied from CRM #[id number]. Topic: Clinical - Medical Advice >> Sep 21, 2024 10:45 AM Donald Palmer wrote: Reason for CRM: Patient called in requesting support for surgery aftercare. Please contact patient to discuss his needs from pcp. Patient can be reached at [phone]

## 2024-09-21 NOTE — Telephone Encounter (Signed)
 Patient called to ask for raised toilet seat and a refill for his insulin .  Patient advised to call his PCP to discuss these needs.

## 2024-09-22 NOTE — Telephone Encounter (Signed)
 We have no appts this week at this time; pt would like a call back from the doctor.

## 2024-09-23 NOTE — Telephone Encounter (Signed)
 Pt stated he's unable to come in for an appt b/c of the surgery on his leg, lying flat on his back, unable to move a lot.Stated he has been taking Oxycodone  prescribed by [facility].

## 2024-09-24 NOTE — Telephone Encounter (Signed)
 Informed pt he needs to call [facility] @ [phone] to arrange a date/time they can bring him for an appt ; and to let us  know so we can coordinate an app to see the doctor.

## 2024-09-25 NOTE — Telephone Encounter (Signed)
 Message forwarded to Lela to Palmer/o on PCs forms.   Copied from CRM [id number]. Topic: General - Other >> Sep 23, 2024 10:29 AM Donald Palmer wrote: Reason for CRM:   Caller: Patient & Ms. Joshua (Patient's daughter)  Concern: Looking to confirm the patient's primary care provider/ the attending provider who can sign off on the document needed below.   Request for Independence Assessment for personal care services for Medicaid form. They require the patient's provider to fill out the form and return it to the patient Medicaid insurance provider. They would like to if they can email the document over or bring the document by the office. Specialist attempted to contact [employer] twice with no success.   Please contact the patient to get additional information regarding this matter.   Callback Number: [phone]

## 2024-09-25 NOTE — Telephone Encounter (Signed)
 Please see Previous Message as this Request has already been sent to Lela to f/u with [facility].  opied from CRM #[id number]. Topic: Referral - Request for Referral >> Sep 23, 2024 11:30 AM Marda MATSU wrote: Pt would like home care services.    Please advise >> Sep 23, 2024 11:44 AM Mercer PEDLAR wrote: Patient is calling to make sure referral request was placed.

## 2024-09-28 ENCOUNTER — Telehealth: Payer: Self-pay | Admitting: *Deleted

## 2024-09-28 NOTE — Telephone Encounter (Signed)
 RTC to patient informed him that the doctor may need to see him prior to making [facility] referrals for home care and PT.  Patient has not been seen by the Clinics since his surgery.  Patient was informed that an assessment of his needs may need to be done.  Advised to keep appointment on 10/08/2024 so that these referrals can be done.  Bruna stated would do.  Copied from CRM [id number]. Topic: Referral - Request for Referral >> Sep 23, 2024 11:30 AM Marda MATSU wrote: Pt would like home care services.    Please advise >> Sep 28, 2024 11:43 AM Farrel B wrote: Patient is returning calling to request a call back for a request for home care assistance. Please call patient to advise >> Sep 28, 2024 11:32 AM Marda MATSU wrote: Patient would like a call back regarding Home care services.  >> Sep 23, 2024 11:44 AM Mercer PEDLAR wrote: Patient is calling to make sure referral request was placed.

## 2024-09-28 NOTE — Telephone Encounter (Signed)
 Copied from CRM [id number]. Topic: Clinical - Medication Refill >> Sep 28, 2024 11:21 AM Carrielelia G wrote:  Medication: oxyCODONE -acetaminophen  (PERCOCET/ROXICET) 5-325 MG tablet Pregabalin  (Lyrica ) 150 MG capsule   Has the patient contacted their pharmacy? Yes (Agent: If no, request that the patient contact the pharmacy for the refill. If patient does not wish to contact the pharmacy document the reason why and proceed with request.) (Agent: If yes, when and what did the pharmacy advise?)  This is the patient's preferred pharmacy:  [facility] [phone] - [place], [place] - [address] AT [facility] [address] [place] [address] Phone: [phone] Fax: [phone]  Is this the correct pharmacy for this prescription? Yes If no, delete pharmacy and type the correct one.   Has the prescription been filled recently? Yes  Is the patient out of the medication? Yes  Has the patient been seen for an appointment in the last year OR does the patient have an upcoming appointment? Yes  Can we respond through MyChart? No  Agent: Please be advised that Rx refills may take up to 3 business days. We ask that you follow-up with your pharmacy.

## 2024-09-28 NOTE — Telephone Encounter (Signed)
 Copied from CRM [id number]. Topic: Referral - Request for Referral >> Sep 28, 2024 11:44 AM Farrel B wrote: Did the patient discuss referral with their provider in the last year? Not yet, patient states he has a appointment on the 23rd    Appointment offered? Apt is for the 23rd of October   Type of order/referral and detailed reason for visit: Needing therapy after vascular surgery   Preference of office, provider, location: N/A  If referral order, have you been seen by this specialty before? No (If Yes, this issue or another issue? When? Where?  Can we respond through MyChart? Yes  Patient stated that since surgery he is hopping around but unable to stretch as he should. He stated they advised him at the hospital they would be making him an appt before being discharged after the surgery but has not heard anything as of yet.

## 2024-09-28 NOTE — Telephone Encounter (Signed)
 Informed pt to call the doctor who ordered the Oxycodone . And informed pt he needs an appt with us  before anything can be ordered.

## 2024-09-29 ENCOUNTER — Other Ambulatory Visit: Payer: Self-pay | Admitting: Student

## 2024-10-01 ENCOUNTER — Encounter: Payer: Self-pay | Admitting: Student

## 2024-10-01 ENCOUNTER — Ambulatory Visit (INDEPENDENT_AMBULATORY_CARE_PROVIDER_SITE_OTHER): Admitting: Student

## 2024-10-01 ENCOUNTER — Other Ambulatory Visit: Payer: Self-pay

## 2024-10-01 VITALS — BP 118/80 | HR 102 | Temp 97.4°F | Ht 69.0 in | Wt 162.2 lb

## 2024-10-01 DIAGNOSIS — I739 Peripheral vascular disease, unspecified: Secondary | ICD-10-CM

## 2024-10-01 DIAGNOSIS — E1142 Type 2 diabetes mellitus with diabetic polyneuropathy: Secondary | ICD-10-CM | POA: Diagnosis not present

## 2024-10-01 DIAGNOSIS — Z794 Long term (current) use of insulin: Secondary | ICD-10-CM | POA: Diagnosis not present

## 2024-10-01 DIAGNOSIS — Z1211 Encounter for screening for malignant neoplasm of colon: Secondary | ICD-10-CM

## 2024-10-01 DIAGNOSIS — E1140 Type 2 diabetes mellitus with diabetic neuropathy, unspecified: Secondary | ICD-10-CM

## 2024-10-01 DIAGNOSIS — Z7984 Long term (current) use of oral hypoglycemic drugs: Secondary | ICD-10-CM

## 2024-10-01 DIAGNOSIS — M549 Dorsalgia, unspecified: Secondary | ICD-10-CM

## 2024-10-01 DIAGNOSIS — M545 Low back pain, unspecified: Secondary | ICD-10-CM

## 2024-10-01 DIAGNOSIS — Z79899 Other long term (current) drug therapy: Secondary | ICD-10-CM

## 2024-10-01 LAB — POCT GLYCOSYLATED HEMOGLOBIN (HGB A1C): HbA1c, POC (controlled diabetic range): 7.8 % — AB (ref 0.0–7.0)

## 2024-10-01 LAB — GLUCOSE, CAPILLARY
Glucose-Capillary: 106 mg/dL — ABNORMAL HIGH (ref 70–99)
Glucose-Capillary: 98 mg/dL (ref 70–99)

## 2024-10-01 MED ORDER — PREGABALIN 150 MG PO CAPS: 150.0000 mg | ORAL_CAPSULE | Freq: Two times a day (BID) | ORAL | 6 refills | Status: DC

## 2024-10-01 MED ORDER — ATORVASTATIN CALCIUM 80 MG PO TABS: 80.0000 mg | ORAL_TABLET | Freq: Every day | ORAL | 3 refills | Status: DC

## 2024-10-01 MED ORDER — PREGABALIN 150 MG PO CAPS
150.0000 mg | ORAL_CAPSULE | Freq: Two times a day (BID) | ORAL | 6 refills | Status: DC
Start: 2024-10-01 — End: 2024-10-12

## 2024-10-01 MED ORDER — ACCU-CHEK GUIDE TEST VI STRP: ORAL_STRIP | 12 refills | Status: DC

## 2024-10-01 MED ORDER — EMPAGLIFLOZIN 25 MG PO TABS: 25.0000 mg | ORAL_TABLET | Freq: Every day | ORAL | 11 refills | Status: DC

## 2024-10-01 MED ORDER — DULOXETINE HCL 30 MG PO CPEP: 30.0000 mg | ORAL_CAPSULE | Freq: Every day | ORAL | 11 refills | Status: DC

## 2024-10-01 NOTE — Patient Instructions (Addendum)
 Donald Palmer,Thank you for allowing me to take part in your care today.  Here are your instructions.  1. Please start taking jardiance 25 mg daily   2. I have referred you for a colonoscopy  3. I have referred you to physical therapy   4. Please come back to pick up your paperwork   5. Please come back in 2 months and we can see how your back pain and leg pain is doing after therapy   6. Please take colchicine  only when you have a gout flare, do not take this everyday   7. Please stop taking your ozempic .    PLEASE BRING YOUR MEDICATIONS TO EVERY APPOINTMENT  Thank you, Dr. Tobie  If you have any other questions please contact the internal medicine clinic at [phone] If it is after hours, please call the [place] hospital at [phone] and then ask the person who picks up for the resident on call.

## 2024-10-01 NOTE — Assessment & Plan Note (Signed)
 Patient with past medical history of PAD.  He is status post bypass graft above-knee popliteal artery on the left leg as well as endarterectomy of femoral artery.  He reports having postop pain.  Recommended him go to his vascular surgeon.  He has an appointment coming up on October 29.  Plan: - Continue pregabalin  150 mg twice daily - Encouraged continued follow-up with vascular surgery

## 2024-10-01 NOTE — Progress Notes (Signed)
 CC: Diabetes follow-up  HPI:  [name] is a 63 y.o. male with past medical history of hypertension, PAD, GERD, diabetes who presents for follow-up appointment.  Please see assessment and plan for full HPI.  Medications: Neuropathy: Cymbalta  30 mg daily, pregabalin  150 mg twice daily PAD: Atorvastatin  80 mg daily, Plavix  75 mg daily, cilostazol  50 mg twice daily, Eliquis  5 mg twice daily Type 2 diabetes: Pioglitazone  30 mg daily, Jardiance 25 mg daily Pain: Tylenol  1000 mg every 6 hours as needed Gout: Colchicine  0.6 mg daily as needed Hypertension: Losartan  50 mg daily, metoprolol  tartrate 25 mg twice daily ED: Cialis  10 mg  Past Medical History:  Diagnosis Date   Back pain, chronic    Coronary artery disease    Diabetes mellitus 2007   type 2   ED (erectile dysfunction)    tx w/cialis    Hepatitis C 03/2015   [id number]   History of chronic hepatitis C 05/13/2012   First noted 2013. [employer] tattoos obtained while incarcerated. [place]  2016 SVR 2017    Hypertension goal BP (blood pressure) < 140/80    Microcytic anemia    Neuromuscular disorder (HCC)    PAD (peripheral artery disease)    Pancreatitis 01/09/2012     Current Outpatient Medications:    Accu-Chek Softclix Lancets lancets, check blood sugar 2 times a day, Disp: 100 each, Rfl: 5   acetaminophen  (TYLENOL ) 500 MG tablet, Take 1,000 mg by mouth every 6 (six) hours as needed for mild pain (pain score 1-3) or headache., Disp: , Rfl:    atorvastatin  (LIPITOR ) 80 MG tablet, Take 1 tablet (80 mg total) by mouth daily at 6 PM., Disp: 90 tablet, Rfl: 3   Blood Glucose Monitoring Suppl (ACCU-CHEK GUIDE) w/Device KIT, Check blood sugar 2 times a day, Disp: 1 kit, Rfl: 1   cilostazol  (PLETAL ) 50 MG tablet, Take 1 tablet (50 mg total) by mouth 2 (two) times daily., Disp: 180 tablet, Rfl: 1   clopidogrel  (PLAVIX ) 75 MG tablet, Take 1 tablet (75 mg total) by mouth daily., Disp: 90 tablet, Rfl: 3   colchicine  0.6 MG  tablet, Take 1 tablet (0.6 mg total) by mouth daily as needed (gout flare)., Disp: 30 tablet, Rfl: 2   DULoxetine  (CYMBALTA ) 30 MG capsule, Take 1 capsule (30 mg total) by mouth daily., Disp: 30 capsule, Rfl: 11   ELIQUIS  5 MG TABS tablet, TAKE 1 TABLET(5 MG) BY MOUTH TWICE DAILY, Disp: 60 tablet, Rfl: 1   glucose blood (ACCU-CHEK GUIDE TEST) test strip, Use to test blood glucose 2 times daily., Disp: 100 each, Rfl: 12   Insulin  Pen Needle (PEN NEEDLES) 32G X 4 MM MISC, Use to inject liraglutide  once a day, Disp: 100 each, Rfl: 3   losartan  (COZAAR ) 50 MG tablet, Take 50 mg by mouth daily., Disp: , Rfl:    metoprolol  tartrate (LOPRESSOR ) 25 MG tablet, Take 1 tablet (25 mg total) by mouth 2 (two) times daily., Disp: 60 tablet, Rfl: 2   oxyCODONE -acetaminophen  (PERCOCET/ROXICET) 5-325 MG tablet, Take 1 tablet by mouth every 6 (six) hours as needed for severe pain (pain score 7-10)., Disp: 10 tablet, Rfl: 0   pioglitazone  (ACTOS ) 30 MG tablet, Take 1 tablet (30 mg total) by mouth daily., Disp: 30 tablet, Rfl: 11   pregabalin  (LYRICA ) 150 MG capsule, Take 1 capsule (150 mg total) by mouth 2 (two) times daily., Disp: 30 capsule, Rfl: 6   tadalafil  (CIALIS ) 10 MG tablet, Take 1 tablet (10 mg  total) by mouth as needed for erectile dysfunction., Disp: 20 tablet, Rfl: 1  Review of Systems:    MSK: Patient endorses back pain    Physical Exam:  Vitals:   10/01/24 1030  BP: 118/80  Pulse: (!) 102  Temp: (!) 97.4 F (36.3 C)  TempSrc: Oral  SpO2: 100%  Weight: 162 lb 3.2 oz (73.6 kg)  Height: 5' 9 (1.753 m)   General: Patient is sitting comfortably in the room  Head: Normocephalic, atraumatic  Cardio: Regular rate and rhythm, no murmurs, rubs or gallops Pulmonary: Clear to ausculation bilaterally with no rales, rhonchi, and crackles  Back: No midline spinal tenderness.  There is some left paraspinal lumbar muscle tenderness.  Negative straight leg raise. Skin: Both surgical scars on left lower  extremity on groin and medial knee healing well.  No obvious signs of infection. Extremities: 2+ pedal pulses to bilateral lower extremities   Assessment & Plan:   Assessment & Plan Type 2 diabetes mellitus with diabetic neuropathy, with long-term current use of insulin  [facility]) Patient has as past medical history of diabetes and he is not well controlled at this time. His current medications include Pioglitazone  30 mg daily. He was on ozempic  but he has stopped it because he states that he was losing too much weight and does not want to be on it anymore. He states that he would like another medication. He has never been tried on SGLT2.  Will initiate today.  Plan: - Continue pioglitazone  30 mg daily - Start Jardiance 25 mg daily - Patient to follow-up in 2 months - Foot exam updated today, patient does have neuropathy in his lower extremities on plantar surface bilaterally - Continue pregabalin  150 mg twice daily - Ambulatory referral for DME for diabetes shoes ordered Colon cancer screening Referred patient to Gastroenterology for colonoscopy  Back pain without sciatica Patient endorses having back pain ever since his surgery.  He has been taking pregabalin  with some effect.  He is also been taking colchicine  with some effect.  He reports he needs physical therapy.  Will refer to physical therapy.  No acute concern for red flag symptoms such as  night sweats or fevers.  No radicular symptoms appreciated on my exam today.  Plan: - Continue pregabalin  150 mg twice daily - Refer to physical therapy - Follow-up in 2 months Diabetic peripheral neuropathy associated with type 2 diabetes mellitus (HCC) Patient with past medical history of neuropathy.  Takes pregabalin  150 mg twice daily.  No acute concerns today.  Updated foot exam today. PAD (peripheral artery disease) Patient with past medical history of PAD.  He is status post bypass graft above-knee popliteal artery on the left leg as well  as endarterectomy of femoral artery.  He reports having postop pain.  Recommended him go to his vascular surgeon.  He has an appointment coming up on October 29.  Plan: - Continue pregabalin  150 mg twice daily - Encouraged continued follow-up with vascular surgery   Patient discussed with Dr. Jeanelle Libby Blanch, DO Internal Medicine Resident PGY-3

## 2024-10-01 NOTE — Progress Notes (Signed)
 Internal Medicine Clinic Attending  Case discussed with the resident at the time of the visit.  We reviewed the resident's history and exam and pertinent patient test results.  I agree with the assessment, diagnosis, and plan of care documented in the resident's note.

## 2024-10-01 NOTE — Assessment & Plan Note (Signed)
 Patient with past medical history of neuropathy.  Takes pregabalin  150 mg twice daily.  No acute concerns today.  Updated foot exam today.

## 2024-10-01 NOTE — Assessment & Plan Note (Addendum)
 Patient has as past medical history of diabetes and he is not well controlled at this time. His current medications include Pioglitazone  30 mg daily. He was on ozempic  but he has stopped it because he states that he was losing too much weight and does not want to be on it anymore. He states that he would like another medication. He has never been tried on SGLT2.  Will initiate today.  Plan: - Continue pioglitazone  30 mg daily - Start Jardiance 25 mg daily - Patient to follow-up in 2 months - Foot exam updated today, patient does have neuropathy in his lower extremities on plantar surface bilaterally - Continue pregabalin  150 mg twice daily - Ambulatory referral for DME for diabetes shoes ordered

## 2024-10-05 NOTE — Telephone Encounter (Signed)
 Pt's daughter picked up all completed documents on 10/02/2024 per [employer], CMA.  Copied from CRM #[id number]. Topic: General - Other >> Oct 02, 2024  3:43 PM Carrielelia G wrote:  Please call Cornelius the daughter regarding documents.  (Declined to mention what documents, when asked, stated you all would know)

## 2024-10-07 ENCOUNTER — Encounter

## 2024-10-08 ENCOUNTER — Ambulatory Visit: Admitting: Student

## 2024-10-12 ENCOUNTER — Telehealth: Payer: Self-pay | Admitting: *Deleted

## 2024-10-12 DIAGNOSIS — E1142 Type 2 diabetes mellitus with diabetic polyneuropathy: Secondary | ICD-10-CM

## 2024-10-12 DIAGNOSIS — E1140 Type 2 diabetes mellitus with diabetic neuropathy, unspecified: Secondary | ICD-10-CM

## 2024-10-12 DIAGNOSIS — I739 Peripheral vascular disease, unspecified: Secondary | ICD-10-CM

## 2024-10-12 MED ORDER — CILOSTAZOL 50 MG PO TABS: 50.0000 mg | ORAL_TABLET | Freq: Two times a day (BID) | ORAL | 1 refills | Status: AC

## 2024-10-12 MED ORDER — APIXABAN 5 MG PO TABS: 5.0000 mg | ORAL_TABLET | Freq: Two times a day (BID) | ORAL | 11 refills | Status: AC

## 2024-10-12 MED ORDER — PIOGLITAZONE HCL 30 MG PO TABS: 30.0000 mg | ORAL_TABLET | Freq: Every day | ORAL | 11 refills | Status: AC

## 2024-10-12 MED ORDER — ACCU-CHEK GUIDE TEST VI STRP: ORAL_STRIP | 12 refills | Status: AC

## 2024-10-12 MED ORDER — ATORVASTATIN CALCIUM 80 MG PO TABS: 80.0000 mg | ORAL_TABLET | Freq: Every day | ORAL | 3 refills | Status: AC

## 2024-10-12 MED ORDER — DULOXETINE HCL 30 MG PO CPEP: 30.0000 mg | ORAL_CAPSULE | Freq: Every day | ORAL | 11 refills | Status: AC

## 2024-10-12 MED ORDER — COLCHICINE 0.6 MG PO TABS: 0.6000 mg | ORAL_TABLET | Freq: Every day | ORAL | 2 refills | Status: DC | PRN

## 2024-10-12 MED ORDER — METOPROLOL TARTRATE 25 MG PO TABS: 25.0000 mg | ORAL_TABLET | Freq: Two times a day (BID) | ORAL | 11 refills | Status: AC

## 2024-10-12 MED ORDER — CLOPIDOGREL BISULFATE 75 MG PO TABS: 75.0000 mg | ORAL_TABLET | Freq: Every day | ORAL | 3 refills | Status: AC

## 2024-10-12 MED ORDER — LOSARTAN POTASSIUM 50 MG PO TABS: 50.0000 mg | ORAL_TABLET | Freq: Every day | ORAL | 11 refills | Status: AC

## 2024-10-12 MED ORDER — ACCU-CHEK SOFTCLIX LANCETS MISC: 5 refills | Status: AC

## 2024-10-12 MED ORDER — EMPAGLIFLOZIN 25 MG PO TABS: 25.0000 mg | ORAL_TABLET | Freq: Every day | ORAL | 11 refills | Status: AC

## 2024-10-12 MED ORDER — ACETAMINOPHEN 500 MG PO TABS: 1000.0000 mg | ORAL_TABLET | Freq: Four times a day (QID) | ORAL | 2 refills | Status: AC | PRN

## 2024-10-12 MED ORDER — PREGABALIN 150 MG PO CAPS: 150.0000 mg | ORAL_CAPSULE | Freq: Two times a day (BID) | ORAL | 6 refills | Status: AC

## 2024-10-12 NOTE — Addendum Note (Signed)
 Addended by: TOBIE GAINES on: 10/12/2024 10:18 PM   Modules accepted: Orders

## 2024-10-12 NOTE — Telephone Encounter (Signed)
 Copied from CRM [id number]. Topic: Clinical - Prescription Issue >> Oct 12, 2024  9:39 AM Zane F wrote: Reason for CRM:   Caller: Pam  Calling From: [employer]  Calling to confirm if this is the office of Dr. Tobie and our fax number. Fax number was provided. Their information is listed below.   They would like all of the patient's medications sent through them and will fax over confirming documentation of patient's request.    [facility] [address] , [place], [place]  [address] P: [phone] F: [phone]

## 2024-10-13 ENCOUNTER — Telehealth: Payer: Self-pay | Admitting: *Deleted

## 2024-10-13 NOTE — Telephone Encounter (Signed)
 Folder # 6 / given to Dr. Koosman to be completed for [facility]. Tried to contact patient lvm for call back.

## 2024-10-13 NOTE — Telephone Encounter (Signed)
 Called paitent LVM for patient to return call to the clinic @ [phone] / needing information so I can complete filling in my part on him PCS form.

## 2024-10-14 ENCOUNTER — Ambulatory Visit: Attending: Vascular Surgery | Admitting: Vascular Surgery

## 2024-10-14 ENCOUNTER — Encounter: Payer: Self-pay | Admitting: Vascular Surgery

## 2024-10-14 VITALS — BP 113/72 | HR 80 | Temp 98.1°F | Ht 69.0 in | Wt 169.0 lb

## 2024-10-14 DIAGNOSIS — I70222 Atherosclerosis of native arteries of extremities with rest pain, left leg: Secondary | ICD-10-CM

## 2024-10-14 MED ORDER — OXYCODONE-ACETAMINOPHEN 5-325 MG PO TABS: 1.0000 | ORAL_TABLET | ORAL | 0 refills | Status: AC | PRN

## 2024-10-14 NOTE — Progress Notes (Signed)
     Subjective:     [name], male   DOB: February 24, 1961, 63 y.o.   MRN: [id number]  HPI 63 year old male with history of left common femoral endarterectomy with patch angioplasty and left femoral to above-knee popliteal artery bypass with PTFE for left lower extremity rest pain.  Pain in the left leg has resolved however he still having pain particularly in his left lateral heel and the toes but denies tissue loss or ulceration.   Review of Systems As above    Objective:   Physical Exam Vitals:   10/14/24 1335  BP: 113/72  Pulse: 80  Temp: 98.1 F (36.7 C)  SpO2: 98%   Awake alert and oriented Nonlabored aspirations Palpable left PT pulse Left groin and above-knee incisions healing well    Assessment:     63 year old male status post left common femoral endarterectomy and femoral to above-knee popliteal artery bypass for rest pain now with palpable posterior tibial pulse    Plan:     F/u 3 months left lower extremity bypass graft duplex  Percocet sent to his pharmacy as his last postoperative prescription     Asim Gersten C. Sheree, MD [facility] Office: [phone] Pager: [phone]

## 2024-10-15 ENCOUNTER — Other Ambulatory Visit: Payer: Self-pay | Admitting: *Deleted

## 2024-10-15 ENCOUNTER — Ambulatory Visit

## 2024-10-15 DIAGNOSIS — I70222 Atherosclerosis of native arteries of extremities with rest pain, left leg: Secondary | ICD-10-CM

## 2024-10-15 DIAGNOSIS — I739 Peripheral vascular disease, unspecified: Secondary | ICD-10-CM

## 2024-10-16 NOTE — Telephone Encounter (Unsigned)
 Copied from CRM #[id number]. Topic: General - Other >> Oct 16, 2024  9:42 AM Diannia H wrote: Reason for CRM: Patient was returning a call for Sturdivant, Lela W  to give emergency contact information and he stated its okay to use his daughter and wife.   Cornelius Molt Daughter [phone]  Gerald Kuehl Spouse [phone]

## 2024-10-21 ENCOUNTER — Telehealth: Payer: Self-pay | Admitting: *Deleted

## 2024-10-21 NOTE — Telephone Encounter (Signed)
 LVM for patient regarding PCS forms been faxed to [facility] / give office 2-3 weeks if not have heard from their office to contact them at  - [phone] / to set up his assessment appointment.

## 2024-10-22 ENCOUNTER — Telehealth: Payer: Self-pay | Admitting: *Deleted

## 2024-10-22 MED ORDER — TADALAFIL 10 MG PO TABS: 10.0000 mg | ORAL_TABLET | ORAL | 1 refills | Status: AC | PRN

## 2024-10-22 NOTE — Telephone Encounter (Signed)
 Pt stated he wants to try the injection for ED; stated he has a friend who stated they work very well. I informed the pt he will need an appt to discuss with the doctor. I asked pt if he needed a refill on Cialis  which is on his med list; he stated he has not tried it d/t cost at [facility] but he wants a rx sent to [facility]. And if Cialis  does not work, he will call back for an appt to discuss injections.

## 2024-10-22 NOTE — Telephone Encounter (Signed)
 Copied from CRM #[id number]. Topic: Clinical - Medication Question >> Oct 22, 2024  2:37 PM Cherylann RAMAN wrote: Reason for CRM: Patient called in and stated that he would like for the provider to order the injection for his erectile dysfunction. Please contact patient at [phone].

## 2024-10-23 ENCOUNTER — Telehealth: Payer: Self-pay | Admitting: *Deleted

## 2024-10-23 ENCOUNTER — Telehealth: Payer: Self-pay | Admitting: Student

## 2024-10-23 DIAGNOSIS — E1140 Type 2 diabetes mellitus with diabetic neuropathy, unspecified: Secondary | ICD-10-CM

## 2024-10-23 NOTE — Telephone Encounter (Addendum)
 I talked to the pt who stated Cialis  too expensive even at [facility]. Stated his brother told him to go thru [facility]  which will only cost $30.00. Sending to [place] for any assistance.

## 2024-10-23 NOTE — Telephone Encounter (Signed)
 Copied from CRM [id number]. Topic: Clinical - Order For Equipment >> Oct 23, 2024  9:21 AM Farrel B wrote: Reason for CRM: Patient [name] [phone] is requesting an update on the diabetic shoes that dr. Tobie placed an order for

## 2024-10-23 NOTE — Telephone Encounter (Signed)
 Copied from CRM #[id number]. Topic: Clinical - Medication Question >> Oct 22, 2024  2:37 PM Cherylann RAMAN wrote: Reason for CRM: Patient called in and stated that he would like for the provider to order the injection for his erectile dysfunction. Please contact patient at [phone]. >> Oct 23, 2024 10:41 AM Graeme ORN wrote: Patient called. States he needs provider to sent through [facility] before sending to pharmacy so he can get medication at discounted rate. He states this is the information the his brother provided him with. Thank You

## 2024-10-23 NOTE — Telephone Encounter (Signed)
 Copied from CRM #[id number]. Topic: Clinical - Prescription Issue >> Oct 23, 2024  4:16 PM Alfonso ORN wrote: Reason for CRM: [facility] with [facility] regarding  ozempic  want to confirm what patient current dosage is     When call back can ask for any pharmacist at  [phone]

## 2024-10-26 NOTE — Telephone Encounter (Signed)
 Call to patient informed him there are no patient assistance programs for the Cialis  .  Will need to try using [facility] or coupon cards.  Patient stated will try this option as he has to do something.

## 2024-10-26 NOTE — Telephone Encounter (Signed)
 RTC to pharmacy.  Patient is no longer on Ozempic .  Spoke with Zebedee a Pharmacist

## 2024-10-28 ENCOUNTER — Other Ambulatory Visit: Payer: Self-pay

## 2024-10-28 ENCOUNTER — Ambulatory Visit: Attending: Student in an Organized Health Care Education/Training Program | Admitting: Cardiovascular Disease

## 2024-10-29 ENCOUNTER — Encounter: Payer: Self-pay | Admitting: Cardiovascular Disease

## 2024-10-29 NOTE — Addendum Note (Signed)
 Addended by: Tylor Courtwright on: 10/29/2024 04:54 PM   Modules accepted: Orders

## 2024-11-10 ENCOUNTER — Ambulatory Visit: Admitting: Podiatry

## 2024-12-29 ENCOUNTER — Other Ambulatory Visit: Payer: Self-pay | Admitting: Student

## 2024-12-29 NOTE — Telephone Encounter (Signed)
 Medication sent to pharmacy

## 2025-01-06 ENCOUNTER — Ambulatory Visit: Payer: Self-pay | Admitting: Student

## 2025-01-06 ENCOUNTER — Encounter: Payer: Self-pay | Admitting: Gastroenterology

## 2025-01-06 ENCOUNTER — Encounter: Payer: Self-pay | Admitting: Student

## 2025-01-06 VITALS — BP 125/81 | HR 70 | Temp 98.6°F | Ht 69.0 in | Wt 173.0 lb

## 2025-01-06 DIAGNOSIS — Z1211 Encounter for screening for malignant neoplasm of colon: Secondary | ICD-10-CM

## 2025-01-06 DIAGNOSIS — E1140 Type 2 diabetes mellitus with diabetic neuropathy, unspecified: Secondary | ICD-10-CM | POA: Diagnosis not present

## 2025-01-06 DIAGNOSIS — Z7984 Long term (current) use of oral hypoglycemic drugs: Secondary | ICD-10-CM

## 2025-01-06 DIAGNOSIS — K2100 Gastro-esophageal reflux disease with esophagitis, without bleeding: Secondary | ICD-10-CM

## 2025-01-06 LAB — GLUCOSE, CAPILLARY: Glucose-Capillary: 173 mg/dL — ABNORMAL HIGH (ref 70–99)

## 2025-01-06 LAB — POCT GLYCOSYLATED HEMOGLOBIN (HGB A1C): HbA1c, POC (controlled diabetic range): 7.6 % — AB (ref 0.0–7.0)

## 2025-01-06 MED ORDER — PANTOPRAZOLE SODIUM 40 MG PO TBEC: 40.0000 mg | DELAYED_RELEASE_TABLET | Freq: Every day | ORAL | 11 refills | Status: DC

## 2025-01-06 NOTE — Progress Notes (Signed)
 "  CC: Chronic condition follow-up  HPI:  Donald Palmer is a 64 y.o. male with past medical history of hypertension, PAD, GERD, diabetes who presents for follow-up appointment.  Please see assessment and plan for full HPI.   Medications: Neuropathy: Cymbalta  30 mg daily, pregabalin  150 mg twice daily PAD: Atorvastatin  80 mg daily, Plavix  75 mg daily, cilostazol  50 mg twice daily, Eliquis  5 mg twice daily Type 2 diabetes: Pioglitazone  30 mg daily, Jardiance  25 mg daily Pain: Tylenol  1000 mg every 6 hours as needed Gout: Colchicine  0.6 mg daily as needed Hypertension: Losartan  50 mg daily, metoprolol  tartrate 25 mg twice daily ED: Cialis  10 mg GERD: Protonix  40 mg daily  Past Medical History:  Diagnosis Date   Back pain, chronic    Coronary artery disease    Diabetes mellitus 2007   type 2   ED (erectile dysfunction)    tx w/cialis    Hepatitis C 03/2015   [id number]   History of chronic hepatitis C 05/13/2012   First noted 2013. RF inc tattoos obtained while incarcerated. [place]  2016 SVR 2017    Hypertension goal BP (blood pressure) < 140/80    Microcytic anemia    Neuromuscular disorder (HCC)    PAD (peripheral artery disease)    Pancreatitis 01/09/2012    Current Medications[1]  Review of Systems:    GI: Patient endorses acid reflux  Physical Exam:  Vitals:   01/06/25 0824  BP: 125/81  Pulse: 70  Temp: 98.6 F (37 C)  TempSrc: Oral  SpO2: 100%  Weight: 173 lb (78.5 kg)  Height: 5' 9 (1.753 m)   General: Patient is sitting comfortably in the room  Head: Normocephalic, atraumatic  Cardio: Regular rate and rhythm, no murmurs, rubs or gallops Pulmonary: Clear to ausculation bilaterally with no rales, rhonchi, and crackles  Abdomen: Soft, nontender with normoactive bowel sounds with no rebound or guarding    Assessment & Plan:   Assessment & Plan Type 2 diabetes mellitus with diabetic neuropathy, without long-term current use of insulin  (HCC) Patient  has a past medical history of type 2 diabetes mellitus.  His A1c today is 7.6. This is down from 7.8.  Still not at goal.  Current medications this time include pioglitazone  30 mg daily and Jardiance  25 mg daily.  Discussed options with patient today.  He did not want to start GLP-1.  Given history of heart disease, would not want to start sulfonylurea.  Patient stated he will try to make lifestyle modifications.  He is due for an eye appointment which he has next week.  His main concern today is his abdominal pain and does not want to discuss his diabetes  Plan: - Continue pioglitazone  30 mg daily - Continue Jardiance  25 mg daily - Patient has ophthalmology appointment coming up next week - Follow-up A1c in 3 months - Patient counseled on lifestyle modifications, if A1c does not improve, will need to discuss insulin   Gastroesophageal reflux disease with esophagitis without hemorrhage Patient presents with 5-day history of acid reflux type burning sensation in his stomach.  It is worse when he eats and worse when he lays down.  He describes it to be a burning type sensation.  Nothing alleviates the pain.  He denies any melena, bright red blood per rectum, NSAID use, or vomiting.  He states he has had this before and thinks it is acid reflux.  Patient has a benign abdominal exam.  This is likely GERD.  He has been previously  worked up for this.  He states he has had endoscopy before, but I am unable to see those records.  He was worked up in 2013 in which potentially he needed H. pylori testing, but this was not completed.  Will trial Protonix .  Will also test for H. Pylori  Plan: - Test for H. pylori with breath test - Start Protonix  40 mg daily - In the meantime to allow Protonix  to build up to steady state, can use Tums or Maalox - If patient does not improve, patient likely will need a endoscopy - Check CBC as patient is on Plavix  and Eliquis  Colon cancer screening Patient referred to  colonoscopy for colon cancer screening   Patient discussed with Dr. Jeanelle Libby Blanch, DO Internal Medicine Resident PGY-3     [1]  Current Outpatient Medications:    Accu-Chek Softclix Lancets lancets, check blood sugar 2 times a day, Disp: 100 each, Rfl: 5   acetaminophen  (TYLENOL ) 500 MG tablet, Take 2 tablets (1,000 mg total) by mouth every 6 (six) hours as needed for mild pain (pain score 1-3) or headache., Disp: 90 tablet, Rfl: 2   apixaban  (ELIQUIS ) 5 MG TABS tablet, Take 1 tablet (5 mg total) by mouth 2 (two) times daily., Disp: 60 tablet, Rfl: 11   atorvastatin  (LIPITOR ) 80 MG tablet, Take 1 tablet (80 mg total) by mouth daily at 6 PM., Disp: 90 tablet, Rfl: 3   Blood Glucose Monitoring Suppl (ACCU-CHEK GUIDE) w/Device KIT, Check blood sugar 2 times a day, Disp: 1 kit, Rfl: 1   cilostazol  (PLETAL ) 50 MG tablet, Take 1 tablet (50 mg total) by mouth 2 (two) times daily., Disp: 180 tablet, Rfl: 1   clopidogrel  (PLAVIX ) 75 MG tablet, Take 1 tablet (75 mg total) by mouth daily., Disp: 90 tablet, Rfl: 3   colchicine  0.6 MG tablet, TAKE ONE TABLET (0.6 MG TOTAL) BY MOUTH DAILY AS NEEDED FOR (GOUT FLARE), Disp: 90 tablet, Rfl: 3   DULoxetine  (CYMBALTA ) 30 MG capsule, Take 1 capsule (30 mg total) by mouth daily., Disp: 30 capsule, Rfl: 11   empagliflozin  (JARDIANCE ) 25 MG TABS tablet, Take 1 tablet (25 mg total) by mouth daily before breakfast., Disp: 30 tablet, Rfl: 11   glucose blood (ACCU-CHEK GUIDE TEST) test strip, Use to test blood glucose 2 times daily., Disp: 100 each, Rfl: 12   Insulin  Pen Needle (PEN NEEDLES) 32G X 4 MM MISC, Use to inject liraglutide  once a day, Disp: 100 each, Rfl: 3   losartan  (COZAAR ) 50 MG tablet, Take 1 tablet (50 mg total) by mouth daily., Disp: 90 tablet, Rfl: 11   metoprolol  tartrate (LOPRESSOR ) 25 MG tablet, Take 1 tablet (25 mg total) by mouth 2 (two) times daily., Disp: 60 tablet, Rfl: 11   oxyCODONE -acetaminophen  (PERCOCET/ROXICET) 5-325 MG tablet,  Take 1 tablet by mouth every 6 (six) hours as needed for severe pain (pain score 7-10)., Disp: 10 tablet, Rfl: 0   oxyCODONE -acetaminophen  (PERCOCET/ROXICET) 5-325 MG tablet, Take 1 tablet by mouth every 4 (four) hours as needed for severe pain (pain score 7-10)., Disp: 30 tablet, Rfl: 0   pioglitazone  (ACTOS ) 30 MG tablet, Take 1 tablet (30 mg total) by mouth daily., Disp: 30 tablet, Rfl: 11   pregabalin  (LYRICA ) 150 MG capsule, Take 1 capsule (150 mg total) by mouth 2 (two) times daily., Disp: 60 capsule, Rfl: 6   tadalafil  (CIALIS ) 10 MG tablet, Take 1 tablet (10 mg total) by mouth as needed for erectile dysfunction., Disp: 20  tablet, Rfl: 1  "

## 2025-01-06 NOTE — Assessment & Plan Note (Signed)
 Patient presents with 5-day history of acid reflux type burning sensation in his stomach.  It is worse when he eats and worse when he lays down.  He describes it to be a burning type sensation.  Nothing alleviates the pain.  He denies any melena, bright red blood per rectum, NSAID use, or vomiting.  He states he has had this before and thinks it is acid reflux.  Patient has a benign abdominal exam.  This is likely GERD.  He has been previously worked up for this.  He states he has had endoscopy before, but I am unable to see those records.  He was worked up in 2013 in which potentially he needed H. pylori testing, but this was not completed.  Will trial Protonix .  Will also test for H. Pylori  Plan: - Test for H. pylori with breath test - Start Protonix  40 mg daily - In the meantime to allow Protonix  to build up to steady state, can use Tums or Maalox - If patient does not improve, patient likely will need a endoscopy - Check CBC as patient is on Plavix  and Eliquis 

## 2025-01-06 NOTE — Assessment & Plan Note (Addendum)
 Patient has a past medical history of type 2 diabetes mellitus.  His A1c today is 7.6. This is down from 7.8.  Still not at goal.  Current medications this time include pioglitazone  30 mg daily and Jardiance  25 mg daily.  Discussed options with patient today.  He did not want to start GLP-1.  Given history of heart disease, would not want to start sulfonylurea.  Patient stated he will try to make lifestyle modifications.  He is due for an eye appointment which he has next week.  His main concern today is his abdominal pain and does not want to discuss his diabetes  Plan: - Continue pioglitazone  30 mg daily - Continue Jardiance  25 mg daily - Patient has ophthalmology appointment coming up next week - Follow-up A1c in 3 months - Patient counseled on lifestyle modifications, if A1c does not improve, will need to discuss insulin 

## 2025-01-06 NOTE — Patient Instructions (Addendum)
 Donald Palmer, Thank you for allowing me to take part in your care today.  Here are your instructions.  1.  I will call you with results of your test  2.  In the next few days you can take Tums or you can take Maalox until the Protonix  kicks in  3.  Please return in 3 months for diabetes follow-up  4.  I am checking your blood counts today   PLEASE BRING YOUR MEDICATIONS TO EVERY APPOINTMENT  Thank you, Dr. Tobie  If you have any other questions please contact the internal medicine clinic at [phone] If it is after hours, please call the [place] [facility] at [phone] and then ask the person who picks up for the resident on call.

## 2025-01-07 ENCOUNTER — Ambulatory Visit: Payer: Self-pay | Admitting: Student

## 2025-01-07 LAB — CBC
Hematocrit: 45.6 % (ref 37.5–51.0)
Hemoglobin: 13.4 g/dL (ref 13.0–17.7)
MCH: 21 pg — ABNORMAL LOW (ref 26.6–33.0)
MCHC: 29.4 g/dL — ABNORMAL LOW (ref 31.5–35.7)
MCV: 71 fL — ABNORMAL LOW (ref 79–97)
Platelets: 417 x10E3/uL (ref 150–450)
RBC: 6.39 x10E6/uL — ABNORMAL HIGH (ref 4.14–5.80)
RDW: 18.5 % — ABNORMAL HIGH (ref 11.6–15.4)
WBC: 5.7 x10E3/uL (ref 3.4–10.8)

## 2025-01-07 LAB — H. PYLORI BREATH TEST: H pylori Breath Test: POSITIVE — AB

## 2025-01-07 NOTE — Progress Notes (Signed)
 Internal Medicine Clinic Attending  Case discussed with the resident at the time of the visit.  We reviewed the resident's history and exam and pertinent patient test results.  I agree with the assessment, diagnosis, and plan of care documented in the resident's note.

## 2025-01-08 ENCOUNTER — Other Ambulatory Visit: Payer: Self-pay | Admitting: Student

## 2025-01-08 ENCOUNTER — Other Ambulatory Visit: Payer: Self-pay | Admitting: *Deleted

## 2025-01-08 ENCOUNTER — Telehealth: Payer: Self-pay | Admitting: *Deleted

## 2025-01-08 MED ORDER — BISMUTH SUBSALICYLATE 525 MG PO TABS: 1.0000 | ORAL_TABLET | Freq: Every day | ORAL | 0 refills | Status: AC

## 2025-01-08 MED ORDER — TETRACYCLINE HCL 500 MG PO CAPS: 500.0000 mg | ORAL_CAPSULE | Freq: Four times a day (QID) | ORAL | 0 refills | Status: AC

## 2025-01-08 MED ORDER — OMEPRAZOLE 20 MG PO CPDR: 20.0000 mg | DELAYED_RELEASE_CAPSULE | Freq: Two times a day (BID) | ORAL | 0 refills | Status: AC

## 2025-01-08 MED ORDER — METRONIDAZOLE 250 MG PO TABS: 250.0000 mg | ORAL_TABLET | Freq: Four times a day (QID) | ORAL | 0 refills | Status: AC

## 2025-01-08 MED ORDER — OMEPRAZOLE 20 MG PO CPDR: 20.0000 mg | DELAYED_RELEASE_CAPSULE | Freq: Two times a day (BID) | ORAL | 0 refills | Status: DC

## 2025-01-08 NOTE — Telephone Encounter (Signed)
 Copied from CRM #[id number]. Topic: Clinical - Lab/Test Results >> Jan 08, 2025  8:13 AM Diannia H wrote: Reason for CRM: Patient called and we went over his labs and he was okay with understanding. States he will get his medicine and call if he needs to. I also told him to check his mychart messages even though I read it to him.

## 2025-01-08 NOTE — Telephone Encounter (Signed)
 [facility] was called and canceled the 180 request.

## 2025-01-08 NOTE — Progress Notes (Signed)
 Was able to reach patient.  Instructed him how to take his medications.  Patient agreed.  He will stop taking his Protonix  and start taking omeprazole  along with his other medications that I have written for him.

## 2025-01-13 ENCOUNTER — Ambulatory Visit: Attending: Vascular Surgery | Admitting: Vascular Surgery

## 2025-01-13 ENCOUNTER — Encounter: Payer: Self-pay | Admitting: Vascular Surgery

## 2025-01-13 ENCOUNTER — Ambulatory Visit (HOSPITAL_BASED_OUTPATIENT_CLINIC_OR_DEPARTMENT_OTHER)
Admission: RE | Admit: 2025-01-13 | Discharge: 2025-01-13 | Disposition: A | Source: Ambulatory Visit | Attending: Vascular Surgery

## 2025-01-13 ENCOUNTER — Ambulatory Visit (HOSPITAL_COMMUNITY)
Admission: RE | Admit: 2025-01-13 | Discharge: 2025-01-13 | Disposition: A | Discharge status: Home / Self Care | Source: Ambulatory Visit | Attending: Vascular Surgery | Admitting: Vascular Surgery

## 2025-01-13 VITALS — BP 112/71 | HR 61 | Temp 98.1°F | Ht 69.0 in | Wt 169.0 lb

## 2025-01-13 DIAGNOSIS — I739 Peripheral vascular disease, unspecified: Secondary | ICD-10-CM | POA: Diagnosis not present

## 2025-01-13 DIAGNOSIS — I70222 Atherosclerosis of native arteries of extremities with rest pain, left leg: Secondary | ICD-10-CM

## 2025-01-13 LAB — VAS US ABI WITH/WO TBI
Left ABI: 0.97
Right ABI: 0.91

## 2025-01-13 NOTE — Progress Notes (Signed)
 "  [name], male   DOB: 1961/10/20, 64 y.o.   MRN: [id number]  Reason for Consult: Follow-up   Referred by Tobie Gaines, DO  Subjective:     HPI:  Donald Palmer is a 64 y.o. male has history of multiple previous left lower extremity procedures most recently left common femoral endarterectomy with patch angioplasty and left lower extremity bypass graft.  At last visit he was having pain in his left lateral heel and he states that pain in the foot has persisted.  He is able to walk really has no limitation and can walk multiple miles without stopping.  He does have pain when he is resting also has pain in the middle of the night occasionally.  He denies tissue loss or ulceration.  He is no longer smoking cigarettes occasionally smokes tobacco and marijuana.  Currently his chief complaint is left eye vision loss for which he is to see an ophthalmologist today.  States that his blood sugar has been better controlled.  He is taking Eliquis  and Plavix  as prescribed.   Past Medical History:  Diagnosis Date   Back pain, chronic    Coronary artery disease    Diabetes mellitus 2007   type 2   ED (erectile dysfunction)    tx w/cialis    Hepatitis C 03/2015   [id number]   History of chronic hepatitis C 05/13/2012   First noted 2013. RF inc tattoos obtained while incarcerated. [place]  2016 SVR 2017    Hypertension goal BP (blood pressure) < 140/80    Microcytic anemia    Neuromuscular disorder (HCC)    PAD (peripheral artery disease)    Pancreatitis 01/09/2012   Family History  Problem Relation Age of Onset   Diabetes Mother    Hypertension Mother    Hyperlipidemia Mother    Asthma Sister    Obesity Sister    Diabetes Brother    Diabetes Brother    Diabetes Daughter    Heart attack Neg Hx    Sudden death Neg Hx    Past Surgical History:  Procedure Laterality Date   ABDOMINAL AORTOGRAM N/A 03/03/2018   Procedure: ABDOMINAL AORTOGRAM;  Surgeon: Court Dorn PARAS, MD;   Location: [facility];  Service: Cardiovascular;  Laterality: N/A;   ABDOMINAL AORTOGRAM N/A 09/14/2024   Procedure: ABDOMINAL AORTOGRAM;  Surgeon: Sheree Penne Bruckner, MD;  Location: [facility] INVASIVE CV LAB;  Service: Cardiovascular;  Laterality: N/A;   ABDOMINAL AORTOGRAM W/LOWER EXTREMITY N/A 06/15/2019   Procedure: ABDOMINAL AORTOGRAM W/LOWER EXTREMITY;  Surgeon: Court Dorn PARAS, MD;  Location: [facility];  Service: Cardiovascular;  Laterality: N/A;   ABDOMINAL AORTOGRAM W/LOWER EXTREMITY N/A 09/09/2023   Procedure: ABDOMINAL AORTOGRAM W/LOWER EXTREMITY;  Surgeon: Sheree Penne Bruckner, MD;  Location: [facility] INVASIVE CV LAB;  Service: Cardiovascular;  Laterality: N/A;   ABDOMINAL AORTOGRAM W/LOWER EXTREMITY N/A 02/24/2024   Procedure: ABDOMINAL AORTOGRAM W/LOWER EXTREMITY;  Surgeon: Sheree Penne Bruckner, MD;  Location: [facility] INVASIVE CV LAB;  Service: Cardiovascular;  Laterality: N/A;   ABDOMINAL AORTOGRAM W/LOWER EXTREMITY N/A 03/18/2024   Procedure: ABDOMINAL AORTOGRAM W/LOWER EXTREMITY;  Surgeon: Lanis Fonda BRAVO, MD;  Location: [facility] INVASIVE CV LAB;  Service: Cardiovascular;  Laterality: N/A;   CORONARY ARTERY BYPASS GRAFT N/A 11/13/2023   Procedure: CORONARY ARTERY BYPASS GRAFTING X 3, USING LEFT INTERNAL MAMMARY ARTERY AND ENDOSCOPICALLY HARVESTED RIGHT SAPHENOUS VEIN GRAFT;  Surgeon: Shyrl Linnie KIDD, MD;  Location: [facility] OR;  Service: Open Heart Surgery;  Laterality: N/A;  ENDARTERECTOMY FEMORAL Left 09/15/2024   Procedure: ENDARTERECTOMY, FEMORAL;  Surgeon: Serene Gaile ORN, MD;  Location: [facility] OR;  Service: Vascular;  Laterality: Left;   FEMORAL-POPLITEAL BYPASS GRAFT Left 09/15/2024   Procedure: BYPASS GRAFT FEMORAL-ABOVE KNEE POPLITEAL ARTERY USING 6mm PROPATEN REMOVABLE RING GRAFT;  Surgeon: Serene Gaile ORN, MD;  Location: [facility] OR;  Service: Vascular;  Laterality: Left;   I & D EXTREMITY Right 05/15/2016   Procedure: RIGHT INDEX FINGER FIRST THROUGH  MIDDLE PHALYNX   AMPUTATION;  Surgeon: Alm Hummer, MD;  Location: [facility] OR;  Service: Orthopedics;  Laterality: Right;   LEFT HEART CATH AND CORONARY ANGIOGRAPHY N/A 10/16/2023   Procedure: LEFT HEART CATH AND CORONARY ANGIOGRAPHY;  Surgeon: Elmira Newman PARAS, MD;  Location: [facility];  Service: Cardiovascular;  Laterality: N/A;   LOWER EXTREMITY ANGIOGRAPHY Bilateral 09/14/2024   Procedure: Lower Extremity Angiography;  Surgeon: Sheree Penne Bruckner, MD;  Location: [facility] INVASIVE CV LAB;  Service: Cardiovascular;  Laterality: Bilateral;   LOWER EXTREMITY INTERVENTION Bilateral 03/03/2018   Procedure: LOWER EXTREMITY INTERVENTION;  Surgeon: Court Dorn PARAS, MD;  Location: [facility];  Service: Cardiovascular;  Laterality: Bilateral;   LOWER EXTREMITY INTERVENTION  02/24/2024   Procedure: LOWER EXTREMITY INTERVENTION;  Surgeon: Sheree Penne Bruckner, MD;  Location: [facility] INVASIVE CV LAB;  Service: Cardiovascular;;   LOWER EXTREMITY INTERVENTION  03/18/2024   Procedure: LOWER EXTREMITY INTERVENTION;  Surgeon: Lanis Fonda BRAVO, MD;  Location: [facility] INVASIVE CV LAB;  Service: Cardiovascular;;   OPEN REDUCTION INTERNAL FIXATION (ORIF) DISTAL PHALANX Right 04/02/2016   Procedure: RIGHT INDEX FINGER REPAIR;  Surgeon: Alm Hummer, MD;  Location: [facility];  Service: Orthopedics;  Laterality: Right;   PATCH ANGIOPLASTY Left 09/15/2024   Procedure: PATCH ANGIOPLASTY DENTON SARTORIUS BIOLOGIC PATCH;  Surgeon: Serene Gaile ORN, MD;  Location: [facility] OR;  Service: Vascular;  Laterality: Left;   PERIPHERAL VASCULAR ATHERECTOMY Left 03/03/2018   Procedure: PERIPHERAL VASCULAR ATHERECTOMY;  Surgeon: Court Dorn PARAS, MD;  Location: [facility];  Service: Cardiovascular;  Laterality: Left;  SFA WITH PTA DRUG COATED BALLOON   PERIPHERAL VASCULAR INTERVENTION Left 06/15/2019   Procedure: PERIPHERAL VASCULAR INTERVENTION;  Surgeon: Court Dorn PARAS, MD;  Location: [facility];  Service:  Cardiovascular;  Laterality: Left;   PERIPHERAL VASCULAR THROMBECTOMY N/A 03/19/2024   Procedure: PERIPHERAL VASCULAR THROMBECTOMY;  Surgeon: Gretta Bruckner PARAS, MD;  Location: [facility];  Service: Cardiovascular;  Laterality: N/A;   TEE WITHOUT CARDIOVERSION N/A 11/13/2023   Procedure: TRANSESOPHAGEAL ECHOCARDIOGRAM;  Surgeon: Shyrl Linnie KIDD, MD;  Location: [facility] OR;  Service: Open Heart Surgery;  Laterality: N/A;    Short Social History:  Social History   Tobacco Use   Smoking status: Former    Current packs/day: 0.10    Average packs/day: 0.1 packs/day for 30.0 years (3.0 ttl pk-yrs)    Types: Cigarettes, Cigars   Smokeless tobacco: Never   Tobacco comments:    Per pt, 3 cigarettes per day, as of 11/11/23  Substance Use Topics   Alcohol  use: Not Currently    Alcohol /week: 5.0 standard drinks of alcohol     Types: 5 Cans of beer per week    Comment: Beer.    Allergies[1]  Current Outpatient Medications  Medication Sig Dispense Refill   Accu-Chek Softclix Lancets lancets check blood sugar 2 times a day 100 each 5   acetaminophen  (TYLENOL ) 500 MG tablet Take 2 tablets (1,000 mg total) by mouth every 6 (six) hours as needed  for mild pain (pain score 1-3) or headache. 90 tablet 2   apixaban  (ELIQUIS ) 5 MG TABS tablet Take 1 tablet (5 mg total) by mouth 2 (two) times daily. 60 tablet 11   atorvastatin  (LIPITOR ) 80 MG tablet Take 1 tablet (80 mg total) by mouth daily at 6 PM. 90 tablet 3   Bismuth  Subsalicylate 525 MG TABS Take 1 tablet by mouth daily for 14 days. 14 tablet 0   Blood Glucose Monitoring Suppl (ACCU-CHEK GUIDE) w/Device KIT Check blood sugar 2 times a day 1 kit 1   cilostazol  (PLETAL ) 50 MG tablet Take 1 tablet (50 mg total) by mouth 2 (two) times daily. 180 tablet 1   clopidogrel  (PLAVIX ) 75 MG tablet Take 1 tablet (75 mg total) by mouth daily. 90 tablet 3   colchicine  0.6 MG tablet TAKE ONE TABLET (0.6 MG TOTAL) BY MOUTH DAILY AS NEEDED FOR (GOUT FLARE) 90  tablet 3   DULoxetine  (CYMBALTA ) 30 MG capsule Take 1 capsule (30 mg total) by mouth daily. 30 capsule 11   empagliflozin  (JARDIANCE ) 25 MG TABS tablet Take 1 tablet (25 mg total) by mouth daily before breakfast. 30 tablet 11   glucose blood (ACCU-CHEK GUIDE TEST) test strip Use to test blood glucose 2 times daily. 100 each 12   Insulin  Pen Needle (PEN NEEDLES) 32G X 4 MM MISC Use to inject liraglutide  once a day 100 each 3   losartan  (COZAAR ) 50 MG tablet Take 1 tablet (50 mg total) by mouth daily. 90 tablet 11   metoprolol  tartrate (LOPRESSOR ) 25 MG tablet Take 1 tablet (25 mg total) by mouth 2 (two) times daily. 60 tablet 11   metroNIDAZOLE  (FLAGYL ) 250 MG tablet Take 1 tablet (250 mg total) by mouth 4 (four) times daily for 14 days. 56 tablet 0   omeprazole  (PRILOSEC) 20 MG capsule Take 1 capsule (20 mg total) by mouth 2 (two) times daily before a meal. 60 capsule 0   oxyCODONE -acetaminophen  (PERCOCET/ROXICET) 5-325 MG tablet Take 1 tablet by mouth every 6 (six) hours as needed for severe pain (pain score 7-10). 10 tablet 0   oxyCODONE -acetaminophen  (PERCOCET/ROXICET) 5-325 MG tablet Take 1 tablet by mouth every 4 (four) hours as needed for severe pain (pain score 7-10). 30 tablet 0   pioglitazone  (ACTOS ) 30 MG tablet Take 1 tablet (30 mg total) by mouth daily. 30 tablet 11   pregabalin  (LYRICA ) 150 MG capsule Take 1 capsule (150 mg total) by mouth 2 (two) times daily. 60 capsule 6   tadalafil  (CIALIS ) 10 MG tablet Take 1 tablet (10 mg total) by mouth as needed for erectile dysfunction. 20 tablet 1   tetracycline  (SUMYCIN ) 500 MG capsule Take 1 capsule (500 mg total) by mouth 4 (four) times daily for 14 days. 56 capsule 0   No current facility-administered medications for this visit.    Review of Systems  Constitutional:  Constitutional negative. HENT: HENT negative.  Eyes: Positive for visual disturbance.   Cardiovascular: Cardiovascular negative.  GI: Positive for abdominal pain.        reflux Musculoskeletal: Positive for leg pain.  Skin: Skin negative.  Neurological: Neurological negative. Hematologic: Hematologic/lymphatic negative.  Psychiatric: Psychiatric negative.        Objective:  Objective   Vitals:   01/13/25 1205  BP: 112/71  Pulse: 61  Temp: 98.1 F (36.7 C)  SpO2: 98%     Physical Exam HENT:     Head: Normocephalic.     Mouth/Throat:  Mouth: Mucous membranes are moist.  Eyes:     Pupils: Pupils are equal, round, and reactive to light.  Cardiovascular:     Rate and Rhythm: Normal rate.     Pulses:          Femoral pulses are 2+ on the right side and 2+ on the left side.      Popliteal pulses are 0 on the left side.       Dorsalis pedis pulses are 0 on the left side.       Posterior tibial pulses are 0 on the left side.  Pulmonary:     Effort: Pulmonary effort is normal.  Abdominal:     General: Abdomen is flat.     Palpations: Abdomen is soft.  Musculoskeletal:        General: Normal range of motion.     Right lower leg: No edema.     Left lower leg: No edema.  Skin:    General: Skin is warm.  Neurological:     General: No focal deficit present.     Mental Status: He is alert.     Data: LEFT      PSV cm/sRatioStenosisWaveformComments  +----------+--------+-----+--------+--------+--------+  ATA Distal20                   biphasic          +----------+--------+-----+--------+--------+--------+  PTA Distal28                   biphasic          +----------+--------+-----+--------+--------+--------+     Left Graft #1: CFA-AK POP  +--------------------+--------+--------+---------+--------+                     PSV cm/sStenosisWaveform Comments  +--------------------+--------+--------+---------+--------+  Inflow             122             biphasic           +--------------------+--------+--------+---------+--------+  Proximal Anastomosis71              biphasic            +--------------------+--------+--------+---------+--------+  Proximal Graft      40              biphasic           +--------------------+--------+--------+---------+--------+  Mid Graft           38              triphasic          +--------------------+--------+--------+---------+--------+  Distal Graft        33              triphasic          +--------------------+--------+--------+---------+--------+  Distal Anastomosis  72              triphasic          +--------------------+--------+--------+---------+--------+  Outflow            65              triphasic          +--------------------+--------+--------+---------+--------+   Avascular hypoechoic structure at the level of the distal thigh suggestive  of a hematoma measures 0.4 x 1.3 x 2.1 cm.    Summary:  Left: No significant change as compared to previous study. Patent left  femoral to above knee popliteal artery bypass graft. Possible hematoma  noted in the distal thigh. Low velocities in the graft could suggest a  threatened bypass.   ABI Findings:  +---------+------------------+-----+----------+--------+  Right   Rt Pressure (mmHg)IndexWaveform  Comment   +---------+------------------+-----+----------+--------+  Brachial 114                                        +---------+------------------+-----+----------+--------+  ATA     108               0.85 monophasic          +---------+------------------+-----+----------+--------+  PTA     116               0.91 monophasic          +---------+------------------+-----+----------+--------+  Great Toe44                0.35                     +---------+------------------+-----+----------+--------+   +---------+------------------+-----+----------+-------+  Left    Lt Pressure (mmHg)IndexWaveform  Comment  +---------+------------------+-----+----------+-------+  Brachial 127                                        +---------+------------------+-----+----------+-------+  ATA     106               0.83 monophasic         +---------+------------------+-----+----------+-------+  PTA     123               0.97 monophasic         +---------+------------------+-----+----------+-------+  Great Toe0                 0.00 Absent             +---------+------------------+-----+----------+-------+   +-------+-----------+-----------+------------+------------+  ABI/TBIToday's ABIToday's TBIPrevious ABIPrevious TBI  +-------+-----------+-----------+------------+------------+  Right 0.91       0.35       0.73        0.34          +-------+-----------+-----------+------------+------------+  Left  0.97       0          0.79        0.29          +-------+-----------+-----------+------------+------------+        Bilateral ABIs appear increased compared to prior study on 07/22/2024. Left  TBIs appear decreased compared to prior study on 07/22/2024.    Summary:  Right: Resting right ankle-brachial index indicates mild right lower  extremity arterial disease. The right toe-brachial index is abnormal.    Left: Resting left ankle-brachial index is within normal range. The left  toe-brachial index is abnormal.       Assessment/Plan:     64 year old male with above-noted history.  He does have persistent pain in the left lower extremity which I believed to be multifactorial likely from history of ischemia with occluded stents and also unfortunately he has decreased velocities in his bypass today without palpable pulses distally.  As such I have recommended proceeding with angiography from a right common femoral approach to evaluate the left lower extremity bypass graft and we can also evaluate left lower extremity runoff at that time.  He will need to hold Eliquis  2 days prior.  All questions were  answered he demonstrates good understanding.     Penne Lonni [place]  MD [facility]      [1]  Allergies Allergen Reactions   Metformin  And Related Other (See Comments)    GI side effects. Stopped 2016   "

## 2025-01-14 ENCOUNTER — Ambulatory Visit (HOSPITAL_COMMUNITY)

## 2025-01-14 ENCOUNTER — Ambulatory Visit

## 2025-01-14 ENCOUNTER — Telehealth: Payer: Self-pay

## 2025-01-14 NOTE — Telephone Encounter (Signed)
 Patient stated that he doesn't want to schedule angio now.  He'd like to wait until March.  Patient requested that I call him back at the end of February.

## 2025-01-14 NOTE — Telephone Encounter (Signed)
 Attempted to call for surgery scheduling. No VM

## 2025-01-19 ENCOUNTER — Telehealth: Payer: Self-pay | Admitting: *Deleted

## 2025-01-19 NOTE — Telephone Encounter (Signed)
-----   Message from Libby Blanch sent at 01/19/2025  8:41 AM EST ----- Patient should have completed the therapy for H Pylori. Could one fo you call to see if he has completed his therapy for H. pylori.  If so, can you have him stop taking his acid reflux medicine (omeprazole ), and in 2 weeks come in for an appointment for test of cure.  Thank you, Dr. Blanch

## 2025-01-19 NOTE — Telephone Encounter (Signed)
 Call to patient .  Has not completed his H'Pylori treatment yet.  Will make an appointment as was told to him by the doctor when he has completed his medication.  Also hd a question about being referred to a Dentist for his partial.  Will forward to Dr. Tobie to see if this can be addressed at his follow up appointment.

## 2025-02-02 ENCOUNTER — Ambulatory Visit: Admitting: Gastroenterology
